# Patient Record
Sex: Male | Born: 1955
Health system: Southern US, Community
[De-identification: ages and names within clinical notes are randomized; demographics above are authoritative.]

## PROBLEM LIST (undated history)

## (undated) DIAGNOSIS — F410 Panic disorder [episodic paroxysmal anxiety] without agoraphobia: Secondary | ICD-10-CM

## (undated) DIAGNOSIS — F32A Depression, unspecified: Secondary | ICD-10-CM

## (undated) DIAGNOSIS — I10 Essential (primary) hypertension: Secondary | ICD-10-CM

## (undated) DIAGNOSIS — H9190 Unspecified hearing loss, unspecified ear: Secondary | ICD-10-CM

## (undated) DIAGNOSIS — I739 Peripheral vascular disease, unspecified: Secondary | ICD-10-CM

## (undated) DIAGNOSIS — R160 Hepatomegaly, not elsewhere classified: Secondary | ICD-10-CM

## (undated) DIAGNOSIS — F329 Major depressive disorder, single episode, unspecified: Secondary | ICD-10-CM

## (undated) DIAGNOSIS — R935 Abnormal findings on diagnostic imaging of other abdominal regions, including retroperitoneum: Secondary | ICD-10-CM

## (undated) DIAGNOSIS — B1920 Unspecified viral hepatitis C without hepatic coma: Secondary | ICD-10-CM

## (undated) DIAGNOSIS — F419 Anxiety disorder, unspecified: Secondary | ICD-10-CM

## (undated) DIAGNOSIS — C220 Liver cell carcinoma: Secondary | ICD-10-CM

## (undated) DIAGNOSIS — K7030 Alcoholic cirrhosis of liver without ascites: Secondary | ICD-10-CM

## (undated) HISTORY — DX: Essential (primary) hypertension: I10

## (undated) HISTORY — PX: COSMETIC SURGERY: SHX468

## (undated) HISTORY — DX: Peripheral vascular disease, unspecified: I73.9

## (undated) HISTORY — DX: Unspecified hearing loss, unspecified ear: H91.90

## (undated) HISTORY — DX: Unspecified viral hepatitis C without hepatic coma: B19.20

## (undated) HISTORY — PX: AORTOGRAM: SHX6300

## (undated) NOTE — *Deleted (*Deleted)
[facility]   Telephone:([phone] Fax:(336) [phone]   Clinic Follow up Note   Patient Care Team: Kallie Locks, FNP as PCP - General (Family Medicine) Malachy Mood, MD as Consulting Physician (Hematology) Malachy Moan, MD as Consulting Physician (Interventional Radiology) Malachy Mood, MD as Consulting Physician (Hematology)  Date of Service:  09/08/2020  CHIEF COMPLAINT: f/u of recurrent HCC  SUMMARY OF ONCOLOGIC HISTORY: Oncology History Overview Note  Cancer Staging No matching staging information was found for the patient.    HCC (hepatocellular carcinoma) (HCC)  04/25/2014 Imaging   MRI Abdomen 04/25/14 - DIAGNOSED IMPRESSION: 3.2 cm enhancing lesion in the posterior segment right hepatic lobe, increased, highly suspicious for hepatocellular carcinoma.   Cirrhosis with splenomegaly and small volume abdominal ascites. Portal vein is patent.   Cholelithiasis with suspected secondary gallbladder wall thickening/edema. If there is clinical concern for cholecystitis, consider hepatobiliary nuclear medicine scan.   06/09/2014 Initial Diagnosis   HCC (hepatocellular carcinoma) (HCC)   06/09/2014 Procedure   He was treated by combination drug-eluting bead transarterial chemoembolization followed by microwave thermal ablation on 6/25 and 06/10/2014. Treated by Dr Ruthe Mannan    07/11/2014 Imaging   MRI abdomen 07/11/14  IMPRESSION: 1. Status post normal ablation of right hepatic lobe a hepatoma. There is no evidence for residual/recurrent enhancing tissue within the ablation site. 2. Cirrhosis and stigmata of portal venous hypertension.     Imaging   Mostly Stable MRIs from 10/03/14 to 02/09/19   02/09/2019 Imaging   MRI Abdomen  IMPRESSION: 1. LI-RADS category LR-3 lesion in segment 7 of the liver and separate LI-RADS category LR-3 lesion in segment 6 of the liver, both essentially stable in size from 08/11/2018. Surveillance imaging is  suggested. 2. Hepatic cirrhosis with splenorenal shunting indicating portal venous hypertension. 3. No recurrence along the ablation site itself. 4. Multiple gallstones. 5.  Aortic Atherosclerosis (ICD10-I70.0). 6. Splenomegaly.   02/01/2020 Imaging   MRI abdomen 02/01/20   IMPRESSION: 1. Recurrent multifocal hepatocellular carcinoma with significant growth of segment 7 right liver dome tumor, new tumor at the previous ablation defect in segment 7, enlarging segment 6 tumor and new segment 2 tumor. 2. Cirrhosis. Mild-to-moderate splenomegaly. Small paraumbilical and perisplenic varices. No ascites. 3. Cholelithiasis.  No biliary ductal dilatation. 4.  Aortic Atherosclerosis (ICD10-I70.0).   03/21/2020 Procedure   He was treated with Y90 Radio embolizatino on 03/21/20 and 04/18/20      CURRENT THERAPY:  ***  INTERVAL HISTORY: *** Jeffrey Snow is here for a follow up. He presents to the clinic alone.    REVIEW OF SYSTEMS:  *** Constitutional: Denies fevers, chills or abnormal weight loss Eyes: Denies blurriness of vision Ears, nose, mouth, throat, and face: Denies mucositis or sore throat Respiratory: Denies cough, dyspnea or wheezes Cardiovascular: Denies palpitation, chest discomfort or lower extremity swelling Gastrointestinal:  Denies nausea, heartburn or change in bowel habits Skin: Denies abnormal skin rashes Lymphatics: Denies new lymphadenopathy or easy bruising Neurological:Denies numbness, tingling or new weaknesses Behavioral/Psych: Mood is stable, no new changes  All other systems were reviewed with the patient and are negative.  MEDICAL HISTORY:  Past Medical History:  Diagnosis Date  . Abnormal MRI of abdomen, liver  04/21/2014  . Anxiety   . Cirrhosis, alcoholic (HCC) 04/21/2014  . Claudication of left lower extremity (HCC) m-1  . Depression   . HCC (hepatocellular carcinoma) (HCC)   . Hepatitis C   . Hypertension   . Liver mass   . Panic  attack      SURGICAL HISTORY: Past Surgical History:  Procedure Laterality Date  . ABDOMINAL AORTAGRAM N/A 12/27/2013   Procedure: ABDOMINAL Ronny Flurry;  Surgeon: Fransisco Hertz, MD;  Location: [facility] CATH LAB;  Service: Cardiovascular;  Laterality: N/A;  . AORTOGRAM    . COSMETIC SURGERY     facial reconstructive surgery  . ESOPHAGOGASTRODUODENOSCOPY N/A 12/17/2014   Procedure: ESOPHAGOGASTRODUODENOSCOPY (EGD);  Surgeon: Barrie Folk, MD;  Location: [facility];  Service: Endoscopy;  Laterality: N/A;  . ESOPHAGOGASTRODUODENOSCOPY N/A 09/05/2015   Procedure: ESOPHAGOGASTRODUODENOSCOPY (EGD);  Surgeon: Graylin Shiver, MD;  Location: [facility];  Service: Endoscopy;  Laterality: N/A;  . IR ANGIOGRAM EXTREMITY RIGHT  03/21/2020  . IR ANGIOGRAM SELECTIVE EACH ADDITIONAL VESSEL  03/10/2020  . IR ANGIOGRAM SELECTIVE EACH ADDITIONAL VESSEL  03/10/2020  . IR ANGIOGRAM SELECTIVE EACH ADDITIONAL VESSEL  03/10/2020  . IR ANGIOGRAM SELECTIVE EACH ADDITIONAL VESSEL  03/10/2020  . IR ANGIOGRAM SELECTIVE EACH ADDITIONAL VESSEL  03/10/2020  . IR ANGIOGRAM SELECTIVE EACH ADDITIONAL VESSEL  03/21/2020  . IR ANGIOGRAM SELECTIVE EACH ADDITIONAL VESSEL  03/21/2020  . IR ANGIOGRAM SELECTIVE EACH ADDITIONAL VESSEL  03/21/2020  . IR ANGIOGRAM SELECTIVE EACH ADDITIONAL VESSEL  03/21/2020  . IR ANGIOGRAM SELECTIVE EACH ADDITIONAL VESSEL  03/21/2020  . IR ANGIOGRAM SELECTIVE EACH ADDITIONAL VESSEL  04/18/2020  . IR ANGIOGRAM SELECTIVE EACH ADDITIONAL VESSEL  04/18/2020  . IR ANGIOGRAM VISCERAL SELECTIVE  03/10/2020  . IR ANGIOGRAM VISCERAL SELECTIVE  03/21/2020  . IR ANGIOGRAM VISCERAL SELECTIVE  04/18/2020  . IR EMBO ARTERIAL NOT HEMORR HEMANG INC GUIDE ROADMAPPING  03/10/2020  . IR EMBO TUMOR ORGAN ISCHEMIA INFARCT INC GUIDE ROADMAPPING  03/21/2020  . IR EMBO TUMOR ORGAN ISCHEMIA INFARCT INC GUIDE ROADMAPPING  04/18/2020  . IR GENERIC HISTORICAL  01/19/2015   IR RADIOLOGIST EVAL & MGMT 01/19/2015 Malachy Moan, MD [facility] INTERV RAD  . IR GENERIC HISTORICAL   07/23/2016   IR RADIOLOGIST EVAL & MGMT 07/23/2016 Malachy Moan, MD [facility] INTERV RAD  . IR GENERIC HISTORICAL  05/30/2015   IR RADIOLOGIST EVAL & MGMT 05/30/2015 [facility] INTERV RAD  . IR GENERIC HISTORICAL  10/04/2014   IR RADIOLOGIST EVAL & MGMT 10/04/2014 Malachy Moan, MD [facility] INTERV RAD  . IR GENERIC HISTORICAL  02/11/2017   IR RADIOLOGIST EVAL & MGMT 02/11/2017 Malachy Moan, MD [facility] INTERV RAD  . IR RADIOLOGIST EVAL & MGMT  08/13/2017  . IR RADIOLOGIST EVAL & MGMT  08/11/2018  . IR RADIOLOGIST EVAL & MGMT  02/09/2019  . IR RADIOLOGIST EVAL & MGMT  02/10/2020  . IR RADIOLOGIST EVAL & MGMT  05/11/2020  . IR RADIOLOGIST EVAL & MGMT  08/09/2020  . IR US GUIDE VASC ACCESS LEFT  03/10/2020  . IR US GUIDE VASC ACCESS LEFT  03/21/2020  . IR US GUIDE VASC ACCESS LEFT  04/18/2020  . IR US GUIDE VASC ACCESS RIGHT  03/21/2020  . LOWER EXTREMITY ANGIOGRAM Bilateral 12/27/2013   Procedure: LOWER EXTREMITY ANGIOGRAM;  Surgeon: Fransisco Hertz, MD;  Location: [facility] CATH LAB;  Service: Cardiovascular;  Laterality: Bilateral;    I have reviewed the social history and family history with the patient and they are unchanged from previous note.  ALLERGIES:  is allergic to folic acid and oxycodone.  MEDICATIONS:  Current Outpatient Medications  Medication Sig Dispense Refill  . albuterol (VENTOLIN HFA) 108 (90 Base) MCG/ACT inhaler Inhale 2 puffs into the lungs every 6 (six) hours as needed for wheezing or shortness of breath.  18 g 11  . ALPRAZolam (XANAX) 0.5 MG tablet Take 0.5 mg by mouth 2 (two) times daily.     . bisacodyl (DULCOLAX) 5 MG EC tablet Take 5 mg by mouth daily as needed for mild constipation or moderate constipation. Reported on 04/03/2016    . furosemide (LASIX) 20 MG tablet TAKE 1 TABLET BY MOUTH EVERY MORNING 90 tablet 1  . furosemide (LASIX) 20 MG tablet TAKE 1 TABLET(20 MG) BY MOUTH DAILY 90 tablet 0  . gabapentin (NEURONTIN) 300 MG capsule Take 1 capsule (300 mg total) by mouth 3 (three)  times daily. 90 capsule 6  . hydrOXYzine (ATARAX/VISTARIL) 10 MG tablet Take 1 tablet (10 mg total) by mouth 3 (three) times daily as needed. 90 tablet 6  . pantoprazole (PROTONIX) 40 MG tablet TAKE 1 TABLET(40 MG) BY MOUTH TWICE DAILY BEFORE A MEAL 180 tablet 1  . spironolactone (ALDACTONE) 25 MG tablet TAKE 3 TABLETS(75 MG) BY MOUTH DAILY 90 tablet 6  . vitamin C (ASCORBIC ACID) 250 MG tablet Take 500 mg by mouth daily.     No current facility-administered medications for this visit.    PHYSICAL EXAMINATION: ECOG PERFORMANCE STATUS: {CHL ONC ECOG PS:[phone]}  There were no vitals filed for this visit. There were no vitals filed for this visit. *** GENERAL:alert, no distress and comfortable SKIN: skin color, texture, turgor are normal, no rashes or significant lesions EYES: normal, Conjunctiva are pink and non-injected, sclera clear {OROPHARYNX:no exudate, no erythema and lips, buccal mucosa, and tongue normal}  NECK: supple, thyroid normal size, non-tender, without nodularity LYMPH:  no palpable lymphadenopathy in the cervical, axillary {or inguinal} LUNGS: clear to auscultation and percussion with normal breathing effort HEART: regular rate & rhythm and no murmurs and no lower extremity edema ABDOMEN:abdomen soft, non-tender and normal bowel sounds Musculoskeletal:no cyanosis of digits and no clubbing  NEURO: alert & oriented x 3 with fluent speech, no focal motor/sensory deficits  LABORATORY DATA:  I have reviewed the data as listed CBC Latest Ref Rng & Units 08/03/2020 05/01/2020 04/18/2020  WBC 3.8 - 10.8 Thousand/uL 3.3(L) 5.2 4.6  Hemoglobin 13.2 - 17.1 g/dL 45.4 09.8 11.9  Hematocrit 38 - 50 % 43.6 45.3 47.8  Platelets 140 - 400 Thousand/uL 40(L) 45(L) 61(L)     CMP Latest Ref Rng & Units 08/03/2020 05/01/2020 04/18/2020  Glucose 65 - 139 mg/dL 95 88 147(W)  BUN 7 - 25 mg/dL 7 8 15   Creatinine 0.70 - 1.25 mg/dL 2.95 6.21 3.08  Sodium 135 - 146 mmol/L 138 139 138   Potassium 3.5 - 5.3 mmol/L 3.1(L) 3.8 4.3  Chloride 98 - 110 mmol/L 104 105 107  CO2 20 - 32 mmol/L 27 25 23   Calcium 8.6 - 10.3 mg/dL 6.5(H) 8.9 8.9  Total Protein 6.1 - 8.1 g/dL 8.4(O) 6.0(L) 7.1  Total Bilirubin 0.2 - 1.2 mg/dL 2.5(H) 1.2 1.1  Alkaline Phos 38 - 126 U/L - - 132(H)  AST 10 - 35 U/L 22 35 33  ALT 9 - 46 U/L 16 38 30      RADIOGRAPHIC STUDIES: I have personally reviewed the radiological images as listed and agreed with the findings in the report. No results found.   ASSESSMENT & PLAN:  Jeffrey Snow is a 72 y.o. male with    1. Recurrent multifocal hepatocellular carcinoma   -He was initially diagnosed by MRI and elevated tumor marker on 04/25/14. He was treated with microwave ablation in 05/2014.  He has longstanding history of  alcohol and hepatitis C related liver cirrhosis. -He had been seen by Dr Ruthe Mannan with MRIs since then. He was found to have multifocal cancer recurrence in liver on 02/01/20 MRI with elevated tumor marker again.  -Given his multifocal cancer in liver he is not eligible for liver surgery or transplant. His cancer is no longer curable but still treatable to control his disease and prolong his life. I tried to discuss this with him today.  -He was treated with Y90 on 03/21/20 and again on 04/18/20. He is recovering well. -I discussed Y90 did not cure him and his cancer will eventually start growing. When he does progress systemic treatment is recommend to control his disease.  I discussed the systemic treatment options, including oral TKI's, intravenous immunotherapy, with or without bevacizumab. -Patient seemed to have very low healthy literacy, and does not understand why he needs to see me.  Despite I repeatedly explained his diagnosis, prognosis, and treatment options, he did not seem to understand well why he may need systemic therapy, and he was upset that he has losing the eligibility for liver transplant. -Labs from last week reviewed, CBC  and CMP WNL except plt 61K, BG 104, Alk Phos 132. Physical exam unremarkable today.  -I recommend CT Chest to complete staging of current liver cancer.  Patient declined. -F/u 4 to 5 months with lab. I will contact Dr. Ruthe Mannan on when he plans to f/u with him for repeat MRI liver s/p treatment.    2. Liver Cirrhosis, Hep C (treated) -He notes he quit drinking alcohol 10-15 years ago after drinking 8-10 beers a day. He also quit smoking at the same time after smoking for 35 years. He notes he was negative for prior lung cancer screening.  -He was diagnosed with liver cirrhosis by Dr Mayford Knife about 10 years ago and he was referred to Dr. Ruthe Mannan who has been monitored by him since.  -His 2016 upper Endoscopies by Dr Evette Cristal and Dr Madilyn Fireman were negative for Esophageal Varices.  -He was treated with Harvoni for his Hep C by Dr Madilyn Fireman. He was diagnosed after his Cirrhosis.  -He was going to be seen by Annamarie Major for liver transplant, but not found to be eligible per Dr Ruthe Mannan.  -He is on Lasix to keep fluid off his liver. He has been on long term managed by Dr Bradly Chris, per pt.     3. Anxiety/Depression  -On Xanax   4. Chronic right foot neuropathy  -He is being seen by pain specialist at [facility] -He has been on Oxycodone 1-2 tab every few days as needed for this. He is also on 100mg  Gabapentin TID which he does not feel helps him enough.    5. Social Support  -He is single with 3 daughters who live out of town.  -He notes he is able to take care of himself. He does have transportation assistance.     PLAN: -pt declined CT chest  -F/u in 4-5 months for f/u, if he will see Dr. Ruthe Mannan in 3 months with liver MRI     No problem-specific Assessment & Plan notes found for this encounter.   No orders of the defined types were placed in this encounter.  All questions were answered. The patient knows to call the clinic with any problems, questions or concerns. No  barriers to learning was detected. The total time spent in the appointment was {CHL ONC TIME VISIT - [id number]}.     Delphina Cahill 09/08/2020  Oneal Deputy, am acting as scribe for Truitt Merle, MD.   {Add scribe attestation statement}

---

## 1998-12-03 ENCOUNTER — Emergency Department (HOSPITAL_COMMUNITY): Admission: EM | Admit: 1998-12-03 | Discharge: 1998-12-03 | Payer: Self-pay

## 1999-03-09 ENCOUNTER — Encounter: Payer: Self-pay | Admitting: Emergency Medicine

## 1999-03-09 ENCOUNTER — Emergency Department (HOSPITAL_COMMUNITY): Admission: EM | Admit: 1999-03-09 | Discharge: 1999-03-09 | Payer: Self-pay | Admitting: Emergency Medicine

## 2000-01-10 ENCOUNTER — Emergency Department (HOSPITAL_COMMUNITY): Admission: EM | Admit: 2000-01-10 | Discharge: 2000-01-10 | Payer: Self-pay | Admitting: *Deleted

## 2000-01-13 ENCOUNTER — Emergency Department (HOSPITAL_COMMUNITY): Admission: EM | Admit: 2000-01-13 | Discharge: 2000-01-13 | Payer: Self-pay | Admitting: Emergency Medicine

## 2000-01-18 ENCOUNTER — Emergency Department (HOSPITAL_COMMUNITY): Admission: EM | Admit: 2000-01-18 | Discharge: 2000-01-18 | Payer: Self-pay | Admitting: Emergency Medicine

## 2001-04-07 ENCOUNTER — Emergency Department (HOSPITAL_COMMUNITY): Admission: EM | Admit: 2001-04-07 | Discharge: 2001-04-07 | Payer: Self-pay | Admitting: Emergency Medicine

## 2001-05-24 ENCOUNTER — Emergency Department (HOSPITAL_COMMUNITY): Admission: EM | Admit: 2001-05-24 | Discharge: 2001-05-24 | Payer: Self-pay | Admitting: Emergency Medicine

## 2001-07-01 ENCOUNTER — Encounter: Payer: Self-pay | Admitting: Emergency Medicine

## 2001-07-01 ENCOUNTER — Emergency Department (HOSPITAL_COMMUNITY): Admission: EM | Admit: 2001-07-01 | Discharge: 2001-07-01 | Payer: Self-pay | Admitting: Emergency Medicine

## 2002-07-17 ENCOUNTER — Emergency Department (HOSPITAL_COMMUNITY): Admission: EM | Admit: 2002-07-17 | Discharge: 2002-07-17 | Payer: Self-pay | Admitting: Emergency Medicine

## 2004-02-03 ENCOUNTER — Emergency Department (HOSPITAL_COMMUNITY): Admission: EM | Admit: 2004-02-03 | Discharge: 2004-02-03 | Payer: Self-pay | Admitting: Emergency Medicine

## 2005-08-06 ENCOUNTER — Emergency Department (HOSPITAL_COMMUNITY): Admission: EM | Admit: 2005-08-06 | Discharge: 2005-08-06 | Payer: Self-pay | Admitting: Emergency Medicine

## 2009-02-20 ENCOUNTER — Emergency Department (HOSPITAL_COMMUNITY): Admission: EM | Admit: 2009-02-20 | Discharge: 2009-02-20 | Payer: Self-pay | Admitting: Emergency Medicine

## 2009-11-02 ENCOUNTER — Encounter: Admission: RE | Admit: 2009-11-02 | Discharge: 2009-11-02 | Payer: Self-pay | Admitting: Specialist

## 2011-10-24 ENCOUNTER — Emergency Department (HOSPITAL_COMMUNITY)
Admission: EM | Admit: 2011-10-24 | Discharge: 2011-10-24 | Disposition: A | Discharge status: Home / Self Care | Payer: Medicaid Other | Attending: Emergency Medicine | Admitting: Emergency Medicine

## 2011-10-24 ENCOUNTER — Encounter: Payer: Self-pay | Admitting: *Deleted

## 2011-10-24 DIAGNOSIS — R21 Rash and other nonspecific skin eruption: Secondary | ICD-10-CM | POA: Insufficient documentation

## 2011-10-24 DIAGNOSIS — L089 Local infection of the skin and subcutaneous tissue, unspecified: Secondary | ICD-10-CM | POA: Insufficient documentation

## 2011-10-24 DIAGNOSIS — B86 Scabies: Secondary | ICD-10-CM | POA: Insufficient documentation

## 2011-10-24 DIAGNOSIS — F17200 Nicotine dependence, unspecified, uncomplicated: Secondary | ICD-10-CM | POA: Insufficient documentation

## 2011-10-24 HISTORY — DX: Major depressive disorder, single episode, unspecified: F32.9

## 2011-10-24 HISTORY — DX: Panic disorder (episodic paroxysmal anxiety): F41.0

## 2011-10-24 HISTORY — DX: Anxiety disorder, unspecified: F41.9

## 2011-10-24 HISTORY — DX: Depression, unspecified: F32.A

## 2011-10-24 MED ORDER — PERMETHRIN 5 % EX CREA: TOPICAL_CREAM | CUTANEOUS | Status: AC

## 2011-10-24 MED ORDER — PREDNISONE 10 MG PO TABS: 20.0000 mg | ORAL_TABLET | Freq: Every day | ORAL | Status: AC

## 2011-10-24 MED ORDER — DOXYCYCLINE HYCLATE 100 MG PO CAPS: 100.0000 mg | ORAL_CAPSULE | Freq: Two times a day (BID) | ORAL | Status: AC

## 2011-10-24 NOTE — ED Provider Notes (Signed)
History     CSN: [id number] Arrival date & time: 10/24/2011 11:35 AM   First MD Initiated Contact with Patient 10/24/11 1225      Chief Complaint  Patient presents with  . Pain    (Consider location/radiation/quality/duration/timing/severity/associated sxs/prior treatment) Patient is a 55 y.o. male presenting with rash.  Rash  This is a chronic problem. The current episode started more than 1 week ago. The problem has been gradually worsening. The problem is associated with an unknown factor. There has been no fever. The rash is present on the trunk, left lower leg, left arm, right lower leg and right arm. The pain is at a severity of 4/10. The pain is mild. Associated symptoms include itching, pain and weeping. He has tried anti-itch cream for the symptoms. The treatment provided no relief.   Reports rash to upper and lower extremities x 1 month.  Itching to the rash.  Multiple ? Infected ulsers with some pain 4/10.  Denies fever.  States that he just moved and is sleeping on a used mattress.  The rash looks like areas that have been itched and picked at until it drains and becomes infected.  The hair on the patients legs seems to have eggs attached to the hairs.  Denies others in the household with the same. Past Medical History  Diagnosis Date  . Depression   . Anxiety   . Panic attack     Past Surgical History  Procedure Date  . Cosmetic surgery     facial reconstructive surgery    History reviewed. No pertinent family history.  History  Substance Use Topics  . Smoking status: Current Everyday Smoker -- 0.5 packs/day  . Smokeless tobacco: Not on file  . Alcohol Use: 3.6 oz/week    6 Cans of beer per week      Review of Systems  Skin: Positive for itching and rash.  All other systems reviewed and are negative.    Allergies  Review of patient's allergies indicates no known allergies.  Home Medications   Current Outpatient Rx  Name Route Sig Dispense Refill    . ALPRAZOLAM 0.25 MG PO TABS Oral Take 0.25 mg by mouth 3 (three) times daily.      Marland Kitchen DIPHENHYDRAMINE HCL 25 MG PO CAPS Oral Take 25 mg by mouth every 4 (four) hours as needed. For rash     . FLUOXETINE HCL 10 MG PO CAPS Oral Take 10 mg by mouth daily.        BP 131/74  Pulse 71  Temp(Src) 97.8 F (36.6 C) (Oral)  Resp 18  Wt 150 lb (68.04 kg)  SpO2 100%  Physical Exam  Abdominal: Soft.  Skin: Skin is warm and dry. Rash noted.    ED Course  Procedures (including critical care time)  Labs Reviewed - No data to display No results found.   No diagnosis found.    MDM           Jethro Bastos, NP 10/24/11 1555

## 2011-10-24 NOTE — ED Notes (Signed)
Pt states "These places have been here for about a month, been using hydrocortisone cream, they itch"; pt present with multiple healing lesions to arms & left leg

## 2011-10-25 NOTE — ED Provider Notes (Signed)
Medical screening examination/treatment/procedure(s) were performed by non-physician practitioner and as supervising physician I was immediately available for consultation/collaboration.  Geoffery Lyons, MD 10/25/11 1537

## 2012-10-08 ENCOUNTER — Telehealth: Payer: Self-pay | Admitting: Internal Medicine

## 2012-10-08 NOTE — Telephone Encounter (Signed)
S/W PT IN REF TO NP APPT. ON 10/12/12@1 :30 REFERRING DR. Lerry Liner DX-LOW PLATELETS MAILED NP PACKET

## 2012-10-08 NOTE — Telephone Encounter (Signed)
C/D 10/08/12 for appt 10/12/12

## 2012-10-12 ENCOUNTER — Encounter: Payer: Self-pay | Admitting: Internal Medicine

## 2012-10-12 ENCOUNTER — Ambulatory Visit (HOSPITAL_BASED_OUTPATIENT_CLINIC_OR_DEPARTMENT_OTHER): Payer: Medicaid Other | Admitting: Internal Medicine

## 2012-10-12 ENCOUNTER — Other Ambulatory Visit (HOSPITAL_BASED_OUTPATIENT_CLINIC_OR_DEPARTMENT_OTHER): Payer: Medicaid Other

## 2012-10-12 ENCOUNTER — Telehealth: Payer: Self-pay | Admitting: Internal Medicine

## 2012-10-12 ENCOUNTER — Other Ambulatory Visit: Payer: Self-pay | Admitting: Medical Oncology

## 2012-10-12 ENCOUNTER — Ambulatory Visit: Payer: Medicaid Other

## 2012-10-12 VITALS — BP 163/79 | HR 52 | Temp 96.9°F | Resp 20 | Ht 68.0 in | Wt 155.0 lb

## 2012-10-12 DIAGNOSIS — F1010 Alcohol abuse, uncomplicated: Secondary | ICD-10-CM

## 2012-10-12 DIAGNOSIS — F17200 Nicotine dependence, unspecified, uncomplicated: Secondary | ICD-10-CM

## 2012-10-12 DIAGNOSIS — D696 Thrombocytopenia, unspecified: Secondary | ICD-10-CM

## 2012-10-12 LAB — COMPREHENSIVE METABOLIC PANEL (CC13)
ALT: 151 U/L — ABNORMAL HIGH (ref 0–55)
AST: 111 U/L — ABNORMAL HIGH (ref 5–34)
Albumin: 3.8 g/dL (ref 3.5–5.0)
Alkaline Phosphatase: 88 U/L (ref 40–150)
BUN: 7 mg/dL (ref 7.0–26.0)
CO2: 22 mEq/L (ref 22–29)
Calcium: 9.3 mg/dL (ref 8.4–10.4)
Chloride: 103 mEq/L (ref 98–107)
Creatinine: 0.8 mg/dL (ref 0.7–1.3)
Glucose: 103 mg/dl — ABNORMAL HIGH (ref 70–99)
Potassium: 4.3 mEq/L (ref 3.5–5.1)
Sodium: 134 mEq/L — ABNORMAL LOW (ref 136–145)
Total Bilirubin: 1.08 mg/dL (ref 0.20–1.20)
Total Protein: 6.5 g/dL (ref 6.4–8.3)

## 2012-10-12 LAB — CBC WITH DIFFERENTIAL/PLATELET
BASO%: 0.2 % (ref 0.0–2.0)
Basophils Absolute: 0 10*3/uL (ref 0.0–0.1)
EOS%: 0.7 % (ref 0.0–7.0)
Eosinophils Absolute: 0 10*3/uL (ref 0.0–0.5)
HCT: 42.7 % (ref 38.4–49.9)
HGB: 14.6 g/dL (ref 13.0–17.1)
LYMPH%: 29.4 % (ref 14.0–49.0)
MCH: 31.1 pg (ref 27.2–33.4)
MCHC: 34.2 g/dL (ref 32.0–36.0)
MCV: 90.9 fL (ref 79.3–98.0)
MONO#: 0.5 10*3/uL (ref 0.1–0.9)
MONO%: 13.1 % (ref 0.0–14.0)
NEUT#: 2.3 10*3/uL (ref 1.5–6.5)
NEUT%: 56.6 % (ref 39.0–75.0)
Platelets: 115 10*3/uL — ABNORMAL LOW (ref 140–400)
RBC: 4.7 10*6/uL (ref 4.20–5.82)
RDW: 13.7 % (ref 11.0–14.6)
WBC: 4.1 10*3/uL (ref 4.0–10.3)
lymph#: 1.2 10*3/uL (ref 0.9–3.3)
nRBC: 0 % (ref 0–0)

## 2012-10-12 LAB — LACTATE DEHYDROGENASE (CC13): LDH: 158 U/L (ref 125–220)

## 2012-10-12 NOTE — Patient Instructions (Signed)
You still have low platelets count but much improved compared to the previous lab work at Dr. Mayford Knife office. Will continue with observation for now. Followup in 3 months

## 2012-10-12 NOTE — Progress Notes (Signed)
Checked in new patient. No financial issues. °

## 2012-10-12 NOTE — Telephone Encounter (Signed)
appts made and printed for pt aom °

## 2012-10-12 NOTE — Progress Notes (Signed)
[facility] Telephone:(336) [phone]   Fax:(336) [phone]  CONSULT NOTE  REASON FOR CONSULTATION:  56 years old white male with thrombocytopenia.  HPI Jeffrey Snow is a 56 y.o. male was past medical history significant for hypertension, depression and anxiety as well as long history of alcohol and tobacco abuse. The patient was seen recently by his primary care physician Dr. Mayford Knife and on repeat blood work on 10/02/2012, he was found to have low platelets count of 38,000. He for the elevated liver enzymes. For the elevated liver enzyme, for the elevated liver enzymes regarding his liver enzymes was also found to have elevated liver enzymes including AST of 321 and ALT of 243. He had a similar episode of low platelets 2 years ago before dental extraction and the patient was taken daily aspirin and BC. He does continue that for a week and his platelets count recovered before his dental extraction. He was referred to me today for evaluation and recommendation regarding his thrombocytopenia. The patient continues to take baby aspirin on daily basis. The patient is feeling fine today with no specific complaints. He denied having any bleeding issues, bruises or ecchymosis. He specifically denied having any epistaxis, gum bleeds or rectal bleeding.  He has no significant weight loss or night sweats. He has no chest pain, shortness breath, cough or hemoptysis. His brother also has thrombocytopenia and hepatitis C. The patient is single and has 2 daughters. 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 He has a history of smoking one pack per day for the last 60 years and unfortunately he continues to smoke and I strongly encouraged him to quit smoking. He also has a history of alcohol abuse and still drinks around 4 beers every day. No history of drug abuse. @SFHPI @  Past Medical History  Diagnosis Date  . Depression   . Anxiety   . Panic attack     Past  Surgical History  Procedure Date  . Cosmetic surgery     facial reconstructive surgery    No family history on file.  Social History History  Substance Use Topics  . Smoking status: Current Every Day Smoker -- 0.5 packs/day  . Smokeless tobacco: Not on file  . Alcohol Use: 3.6 oz/week    6 Cans of beer per week    No Known Allergies  Current Outpatient Prescriptions  Medication Sig Dispense Refill  . ALPRAZolam (XANAX) 0.25 MG tablet Take 0.25 mg by mouth 3 (three) times daily.        Marland Kitchen FLUoxetine (PROZAC) 10 MG capsule Take 10 mg by mouth daily.        Marland Kitchen zolpidem (AMBIEN) 10 MG tablet Take 10 mg by mouth at bedtime as needed.      . diphenhydrAMINE (BENADRYL) 25 mg capsule Take 25 mg by mouth every 4 (four) hours as needed. For rash         Review of Systems  A comprehensive review of systems was negative.  Physical Exam  AVW:[id number], healthy, no distress, well nourished and well developed SKIN: skin color, texture, turgor are normal HEAD: Normocephalic EYES: normal EARS: External ears normal OROPHARYNX:no exudate and no erythema  NECK: supple, no adenopathy LYMPH:  no palpable lymphadenopathy, no hepatosplenomegaly LUNGS: clear to auscultation  HEART: regular rate & rhythm, no murmurs and no gallops ABDOMEN:abdomen soft, non-tender, normal bowel sounds and no masses or organomegaly BACK: Back symmetric, no curvature. EXTREMITIES:no joint deformities, effusion, or inflammation, no  edema, no skin discoloration  NEURO: alert & oriented x 3 with fluent speech, no focal motor/sensory deficits  PERFORMANCE STATUS: ECOG 1  LABORATORY DATA: Lab Results  Component Value Date   WBC 4.1 10/12/2012   HGB 14.6 10/12/2012   HCT 42.7 10/12/2012   MCV 90.9 10/12/2012   PLT 115* 10/12/2012      Chemistry      Component Value Date/Time   NA 134* 10/12/2012 1331   K 4.3 10/12/2012 1331   CL 103 10/12/2012 1331   CO2 22 10/12/2012 1331   BUN 7.0 10/12/2012 1331    CREATININE 0.8 10/12/2012 1331      Component Value Date/Time   CALCIUM 9.3 10/12/2012 1331   ALKPHOS 88 10/12/2012 1331   AST 111* 10/12/2012 1331   ALT 151* 10/12/2012 1331   BILITOT 1.08 10/12/2012 1331       RADIOGRAPHIC STUDIES: No results found.  ASSESSMENT:  This is a very pleasant 56 years old white male with history of thrombocytopenia most likely secondary to alcohol abuse plus/minus mild ITP. His platelets count significantly improved today, up to 115,000.  PLAN: I have a lengthy discussion with the patient today about his condition. He had elevated liver enzymes on the previous bloodwork which is again consistent with his history of alcohol abuse. I strongly encouraged him to quit alcohol drinking. I also advised the patient not to take any over-the-counter medication and to stop taking aspirin on daily basis. I recommend for him continuous observation for now.  I would see him back for followup visit in 3 months with repeat CBC and LDH. For the elevated liver enzymes, this currently evaluated by his PCP, Dr. Mayford Knife.  The patient was advised to call me immediately if he has any concerning symptoms in the interval.  All questions were answered. The patient knows to call the clinic with any problems, questions or concerns. We can certainly see the patient much sooner if necessary.  Thank you so much for allowing me to participate in the care of Jeffrey Snow. I will continue to follow up the patient with you and assist in his care.  I spent 30 minutes counseling the patient face to face. The total time spent in the appointment was 55 minutes.  Genie Wenke K. 10/12/2012, 3:12 PM

## 2013-01-04 ENCOUNTER — Other Ambulatory Visit: Payer: Self-pay | Admitting: Internal Medicine

## 2013-01-04 DIAGNOSIS — R748 Abnormal levels of other serum enzymes: Secondary | ICD-10-CM

## 2013-01-06 ENCOUNTER — Other Ambulatory Visit: Payer: Self-pay | Admitting: Gastroenterology

## 2013-01-06 ENCOUNTER — Ambulatory Visit
Admission: RE | Admit: 2013-01-06 | Discharge: 2013-01-06 | Disposition: A | Discharge status: Home / Self Care | Payer: Medicaid Other | Source: Ambulatory Visit | Attending: Internal Medicine | Admitting: Internal Medicine

## 2013-01-06 DIAGNOSIS — K7689 Other specified diseases of liver: Secondary | ICD-10-CM

## 2013-01-06 DIAGNOSIS — R748 Abnormal levels of other serum enzymes: Secondary | ICD-10-CM

## 2013-01-11 ENCOUNTER — Other Ambulatory Visit: Payer: Medicaid Other | Admitting: Lab

## 2013-01-11 ENCOUNTER — Ambulatory Visit: Payer: Medicaid Other | Admitting: Internal Medicine

## 2013-01-11 ENCOUNTER — Other Ambulatory Visit: Payer: Medicaid Other

## 2013-01-12 ENCOUNTER — Other Ambulatory Visit: Payer: Medicaid Other

## 2013-01-29 ENCOUNTER — Other Ambulatory Visit: Payer: Medicaid Other

## 2013-01-30 ENCOUNTER — Ambulatory Visit
Admission: RE | Admit: 2013-01-30 | Discharge: 2013-01-30 | Disposition: A | Discharge status: Home / Self Care | Payer: Medicaid Other | Source: Ambulatory Visit | Attending: Gastroenterology | Admitting: Gastroenterology

## 2013-01-30 DIAGNOSIS — K7689 Other specified diseases of liver: Secondary | ICD-10-CM

## 2013-01-30 MED ORDER — GADOBENATE DIMEGLUMINE 529 MG/ML IV SOLN
15.0000 mL | Freq: Once | INTRAVENOUS | Status: AC | PRN
  Administered 2013-01-30: 15 mL via INTRAVENOUS

## 2013-04-20 ENCOUNTER — Other Ambulatory Visit (HOSPITAL_COMMUNITY): Payer: Self-pay | Admitting: Gastroenterology

## 2013-04-20 DIAGNOSIS — R7402 Elevation of levels of lactic acid dehydrogenase (LDH): Secondary | ICD-10-CM

## 2013-04-20 DIAGNOSIS — R935 Abnormal findings on diagnostic imaging of other abdominal regions, including retroperitoneum: Secondary | ICD-10-CM

## 2013-04-20 DIAGNOSIS — K7689 Other specified diseases of liver: Secondary | ICD-10-CM

## 2013-06-04 ENCOUNTER — Telehealth: Payer: Self-pay | Admitting: *Deleted

## 2013-06-04 NOTE — Telephone Encounter (Signed)
Pt called stating that he needs a walker for his biopsy appt today.  He states that he feel and hit his head.  He is alone and no one cares about him.  His PCP is out of town so he cannot get a walker.  Informed him that he was last seen 09/2012 for thrombocytopenia and FTKA for his appt on 12/2012 because he did not want to keep the appt so we would not be providing him with a wheelchair.  Advised he go to the ED if he fell and they can possibly provide an order for equipment he may need.  He verbalized understanding.  SLJ

## 2013-06-23 ENCOUNTER — Other Ambulatory Visit: Payer: Self-pay | Admitting: *Deleted

## 2013-06-23 DIAGNOSIS — R609 Edema, unspecified: Secondary | ICD-10-CM

## 2013-06-30 ENCOUNTER — Telehealth: Payer: Self-pay | Admitting: Vascular Surgery

## 2013-06-30 NOTE — Telephone Encounter (Addendum)
Message copied by Rosalyn Charters on Wed Jun 30, 2013  1:18 PM ------      Message from: Melene Plan      Created: Wed Jun 30, 2013 12:32 PM      Regarding: RE: needs to be seen sooner      Contact: [phone]       If they are truly having problems they need to call back their referring MD and have them call us if they think it needs moving up. Legally we can NOT advice a pt that is not ours.      ----- Message -----         From: Erenest Blank, RN         Sent: 06/30/2013  11:58 AM           To: Melene Plan, RN, Marcellus Scott, #      Subject: FW: needs to be seen sooner                              Revonda Standard- this is a new pt. that I really should not triage before he is seen here.  These messages on new pts. that are requesting to get in sooner, need to go the appointment desk please.               ----- Message -----         From: Marcellus Scott         Sent: 06/30/2013  11:11 AM           To: [name], RN      Subject: needs to be seen sooner                                  Jeffrey Snow needs to be seen asap. He is having problems and cant wait til September.            Please call            Thanks      Revonda Standard             ------  eft message for patient stating that we do not have an appt. available before 08-2013 and to call his referring md if he his truely experiencing problems as per message from judy

## 2013-08-19 ENCOUNTER — Encounter: Payer: Self-pay | Admitting: Vascular Surgery

## 2013-08-20 ENCOUNTER — Ambulatory Visit (INDEPENDENT_AMBULATORY_CARE_PROVIDER_SITE_OTHER): Payer: Medicaid Other | Admitting: Vascular Surgery

## 2013-08-20 ENCOUNTER — Encounter: Payer: Medicaid Other | Admitting: Vascular Surgery

## 2013-08-20 ENCOUNTER — Encounter (INDEPENDENT_AMBULATORY_CARE_PROVIDER_SITE_OTHER): Payer: Medicaid Other | Admitting: *Deleted

## 2013-08-20 ENCOUNTER — Encounter: Payer: Self-pay | Admitting: Vascular Surgery

## 2013-08-20 VITALS — BP 113/71 | HR 71 | Ht 68.0 in | Wt 154.0 lb

## 2013-08-20 DIAGNOSIS — R609 Edema, unspecified: Secondary | ICD-10-CM

## 2013-08-20 DIAGNOSIS — I70219 Atherosclerosis of native arteries of extremities with intermittent claudication, unspecified extremity: Secondary | ICD-10-CM

## 2013-08-20 DIAGNOSIS — M79609 Pain in unspecified limb: Secondary | ICD-10-CM | POA: Insufficient documentation

## 2013-08-20 NOTE — Progress Notes (Signed)
[facility]  Referred by:  Jearld Lesch, MD [phone] [place], [place] [address]  Reason for referral: Right leg pain  History of Present Illness  Jeffrey Snow is a 57 y.o. ([birth year]) male who presents with chief complaint: R>L leg pain.  Onset of symptom occurred ~one year ago.  Pain is described as aching and cramping, R>L, severity 3-5/10, and associated with ambulation.  Patient has attempted to treat this pain with rest.  The patient has no rest pain symptoms also and no leg wounds/ulcers.  The patient has some right leg swelling and varicosities.  The patient has been attributing his pain to his varicose veins.  By report, one year ago, the patient was walking 1.5 miles without problem.  Now he can go ~100 yards and develops sx.  Pt also notes consistent numbness in right foot and intermittent burning sensation.  Atherosclerotic risk factors include: HTN, former smoker.  Past Medical History  Diagnosis Date  . Depression   . Anxiety   . Panic attack   . Hypertension   . Hepatitis C     Past Surgical History  Procedure Laterality Date  . Cosmetic surgery      facial reconstructive surgery    History   Social History  . Marital Status: Married    Spouse Name: N/A    Number of Children: N/A  . Years of Education: N/A   Occupational History  . Not on file.   Social History Main Topics  . Smoking status: Former Smoker -- 0.50 packs/day    Quit date: 08/21/2011  . Smokeless tobacco: Not on file  . Alcohol Use: 3.6 oz/week    6 Cans of beer per week  . Drug Use: No     Comment: abused cocaine in the 80's  . Sexual Activity: Not on file   Other Topics Concern  . Not on file   Social History Narrative  . No narrative on file    Family History  Problem Relation Age of Onset  . Diabetes Father   . Diabetes Brother   . Diabetes Brother     Current Outpatient Prescriptions on File Prior to Visit  Medication Sig  Dispense Refill  . ALPRAZolam (XANAX) 0.25 MG tablet Take 0.25 mg by mouth 3 (three) times daily.        Marland Kitchen FLUoxetine (PROZAC) 10 MG capsule Take 10 mg by mouth daily.        Marland Kitchen zolpidem (AMBIEN) 10 MG tablet Take 10 mg by mouth at bedtime as needed.      . diphenhydrAMINE (BENADRYL) 25 mg capsule Take 25 mg by mouth every 4 (four) hours as needed. For rash        No current facility-administered medications on file prior to visit.    No Known Allergies  REVIEW OF SYSTEMS:  (Positives checked otherwise negative)  CARDIOVASCULAR:  [ ]  chest pain, [ ]  chest pressure, [ ]  palpitations, [ ]  shortness of breath when laying flat, [ ]  shortness of breath with exertion,   [x]  pain in feet when walking, [ ]  pain in feet when laying flat, [ ]  history of blood clot in veins (DVT), [ ]  history of phlebitis, [ ]  swelling in legs, [x]  varicose veins  PULMONARY:  [ ]  productive cough, [ ]  asthma, [ ]  wheezing  NEUROLOGIC:  [ ]  weakness in arms or legs, [x]  numbness in arms or legs, [ ]  difficulty speaking or slurred speech, [ ]   temporary loss of vision in one eye, [ ]  dizziness  HEMATOLOGIC:  [ ]  bleeding problems, [ ]  problems with blood clotting too easily  MUSCULOSKEL:  [ ]  joint pain, [ ]  joint swelling  GASTROINTEST:  [ ]   Vomiting blood, [ ]   Blood in stool     GENITOURINARY:  [ ]   Burning with urination, [ ]   Blood in urine  PSYCHIATRIC:  [ ]  history of major depression  INTEGUMENTARY:  [x]  rashes, [ ]  ulcers  CONSTITUTIONAL:  [ ]  fever, [ ]  chills  For VQI Use Only  PRE-ADM LIVING: Home  AMB STATUS: Ambulatory  CAD Sx: None  PRIOR CHF: None  STRESS TEST: [x]  No, [ ]  Normal, [ ]  + ischemia, [ ]  + MI, [ ]  Both  Physical Examination Filed Vitals:   08/20/13 1135  BP: 113/71  Pulse: 71  Height: 5\' 8"  (1.727 m)  Weight: 154 lb (69.854 kg)  SpO2: 100%   Body mass index is 23.42 kg/(m^2).  General: A&O x 3, WDWN  Head: [place]/AT  Ear/Nose/Throat: Hearing grossly intact, nares  w/o erythema or drainage, oropharynx w/o Erythema/Exudate  Eyes: PERRLA, EOMI  Neck: Supple, no nuchal rigidity, no palpable LAD  Pulmonary: Sym exp, good air movt, CTAB, no rales, rhonchi, & wheezing  Cardiac: RRR, Nl S1, S2, no Murmurs, rubs or gallops  Vascular: Vessel Right Left  Radial Palpable Palpable  Brachial  Palpable Palpable  Carotid Palpable, without bruit Palpable, without bruit  Aorta Not palpable N/A  Femoral Palpable Palpable  Popliteal Not palpable Not palpable  PT Not Palpable Not Palpable  DP Not Palpable Not Palpable   Gastrointestinal: soft, NTND, -G/R, - HSM, - masses, - CVAT B  Musculoskeletal: M/S 5/5 throughout except 3/5 PF R foot, Extremities without ischemic changes   Neurologic: CN 2-12 intact , Pain and light touch intact in extremities except decreased in R foot, Motor exam as listed above  Psychiatric: Judgment intact, Mood & affect appropriatefor pt's clinical situation  Dermatologic: See M/S exam for extremity exam, no rashes otherwise noted  Lymph : No Cervical, Axillary, or Inguinal lymphadenopathy   Non-Invasive Vascular Imaging  RLE Venous Insufficiency Duplex (Date: 08/20/2013)  Not SVT or DVT, no reflux  Incidental finding of distal R SFA occlusion with monophasic pop A, PTA, and peroneal artery  Study was reviewed and finalized by myself.  Outside Studies/Documentation 1 pages of outside documents were reviewed including: outpatient clinic note.  Medical Decision Making  Jeffrey Snow is a 57 y.o. male who presents with: B intermittent claudication, Hep C and possible cirrhosis, thrombocyotpenia   I discussed with the patient the natural history of intermittent claudication: 75% of patients have stable or improved symptoms in a year an only 2% require amputation. Eventually 20% may require intervention in a year.  I discussed in depth with the patient the nature of atherosclerosis, and emphasized the importance of  maximal medical management including strict control of blood pressure, blood glucose, and lipid levels, antiplatelet agent, obtaining regular exercise, and cessation of smoking.    The patient is aware that without maximal medical management the underlying atherosclerotic disease process will progress, limiting the benefit of any interventions.  I discussed in depth with the patient a walking plan and how to execute such.  The patient is not interested in starting Pletal. The patient is currently not on a statin.  The patient has possible cirrhosis, so there may be a medical contraindication. The patient is currently not  on an anti-platelet due thrombocytopenia.  The patient will follow up in 2 weeks with the following studies BLE ABI and BLE arterial duplex.    It is not clear to me the cause of this patient's R leg neuropathy and given his multiple co-morbidities, I will need additional information before determining what is the best medical management strategy for his PAD.  I have request some additional records and labs from his PCP.  Thank you for allowing [place] to participate in this patient's care.  Leonides Sake, MD [facility] Office: [phone] Pager: [phone]  08/20/2013, 12:25 PM

## 2013-08-24 ENCOUNTER — Other Ambulatory Visit: Payer: Self-pay | Admitting: *Deleted

## 2013-08-24 DIAGNOSIS — I739 Peripheral vascular disease, unspecified: Secondary | ICD-10-CM

## 2013-09-01 ENCOUNTER — Encounter: Payer: Self-pay | Admitting: Specialist

## 2013-09-02 ENCOUNTER — Encounter: Payer: Self-pay | Admitting: Vascular Surgery

## 2013-09-03 ENCOUNTER — Encounter: Payer: Self-pay | Admitting: Vascular Surgery

## 2013-09-03 ENCOUNTER — Encounter (INDEPENDENT_AMBULATORY_CARE_PROVIDER_SITE_OTHER): Payer: Medicaid Other | Admitting: *Deleted

## 2013-09-03 ENCOUNTER — Ambulatory Visit (INDEPENDENT_AMBULATORY_CARE_PROVIDER_SITE_OTHER): Payer: Medicaid Other | Admitting: Vascular Surgery

## 2013-09-03 VITALS — BP 127/78 | HR 67 | Ht 68.0 in | Wt 153.4 lb

## 2013-09-03 DIAGNOSIS — I739 Peripheral vascular disease, unspecified: Secondary | ICD-10-CM

## 2013-09-03 DIAGNOSIS — I70219 Atherosclerosis of native arteries of extremities with intermittent claudication, unspecified extremity: Secondary | ICD-10-CM

## 2013-09-03 NOTE — Progress Notes (Signed)
[facility]  Established Intermittent Claudication  History of Present Illness  Jeffrey Snow is a 57 y.o. (09/21/1956) male who presents with chief complaint: B leg pain.  The patient's symptoms have improved.  The patient's symptoms are: consistent with intermittent claudication.  The patient's treatment regimen currently included: maximal medical management.  He has been set up with a PCP and is schedule to she her on Oct 1.  He returns today for BLE ABI.  The patient's PMH, PSH, SH, FamHx, Med, and Allergies are unchanged from 08/20/13.  On ROS today: denies rest pain or any ulcers or gangrene  Physical Examination  Filed Vitals:   09/03/13 1432  BP: 127/78  Pulse: 67  Height: 5\' 8"  (1.727 m)  Weight: 153 lb 6.4 oz (69.582 kg)  SpO2: 100%   Body mass index is 23.33 kg/(m^2).  General: A&O x 3, WDWN   Pulmonary: Sym exp, good air movt, CTAB, no rales, rhonchi, & wheezing   Cardiac: RRR, Nl S1, S2, no Murmurs, rubs or gallops   Vascular:  Vessel  Right  Left   Radial  Palpable  Palpable   Brachial  Palpable  Palpable   Carotid  Palpable, without bruit  Palpable, without bruit   Aorta  Not palpable  N/A   Femoral  Palpable  Palpable   Popliteal  Not palpable  Not palpable   PT  Not Palpable  Not Palpable   DP  Not Palpable  Not Palpable    Gastrointestinal: soft, NTND, -G/R, - HSM, - masses, - CVAT B   Musculoskeletal: M/S 5/5 throughout except 3/5 PF R foot, Extremities without ischemic changes   Neurologic: Pain and light touch intact in extremities except decreased in R foot, Motor exam as listed above   Non-Invasive Vascular Imaging ABI (Date: 09/03/2013)  R: 0.70, DP: dampened mono, PT: dampened mono, TBI: 0.40  L: 0.69, DP: dampened mono, PT: dampended mono, TBI: 0.54  Medical Decision Making  Jeffrey Snow is a 57 y.o. male who presents with:  bilateral leg intermittent claudication without evidence of critical limb  ischemia.  Strangely, the patient's sx have improved.  The patient has significantly dampened waveforms in his tibial arteries more compatible with imminent critical limb ischemia.  Based on the patient's vascular studies and examination, I have offered the patient: aortogam, bilateral leg runoff, and possible intervention.  At this point, the patient would like to discuss his options with  [name],[name], MD  prior to proceeding.  I discussed in depth with the patient the nature of atherosclerosis, and emphasized the importance of maximal medical management including strict control of blood pressure, blood glucose, and lipid levels, antiplatelet agents, obtaining regular exercise, and cessation of smoking.    The patient is aware that without maximal medical management the underlying atherosclerotic disease process will progress, limiting the benefit of any interventions.  The patient is currently not on a statin or ASA.  He wants his PCP to decided on both.  Thank you for allowing [place] to participate in this patient's care.  Leonides Sake, MD [facility] Office: [phone] Pager: [phone]  09/03/2013, 4:29 PM

## 2013-09-15 ENCOUNTER — Ambulatory Visit: Payer: Medicaid Other | Admitting: Internal Medicine

## 2013-09-22 ENCOUNTER — Ambulatory Visit (INDEPENDENT_AMBULATORY_CARE_PROVIDER_SITE_OTHER): Payer: Medicaid Other | Admitting: Internal Medicine

## 2013-09-22 ENCOUNTER — Encounter: Payer: Self-pay | Admitting: Internal Medicine

## 2013-09-22 VITALS — BP 147/65 | HR 87 | Temp 97.9°F | Resp 14 | Ht 67.5 in | Wt 158.0 lb

## 2013-09-22 DIAGNOSIS — F329 Major depressive disorder, single episode, unspecified: Secondary | ICD-10-CM

## 2013-09-22 DIAGNOSIS — I70219 Atherosclerosis of native arteries of extremities with intermittent claudication, unspecified extremity: Secondary | ICD-10-CM

## 2013-09-22 DIAGNOSIS — G4700 Insomnia, unspecified: Secondary | ICD-10-CM | POA: Insufficient documentation

## 2013-09-22 DIAGNOSIS — F32A Depression, unspecified: Secondary | ICD-10-CM | POA: Insufficient documentation

## 2013-09-22 DIAGNOSIS — I739 Peripheral vascular disease, unspecified: Secondary | ICD-10-CM | POA: Insufficient documentation

## 2013-09-22 NOTE — Progress Notes (Signed)
  Subjective:    Patient ID: Jeffrey Snow, male    DOB: 10/27/1956, 57 y.o.   MRN: [id number]  HPI:Pt is here to establish care for primary care services. Pt has Hepatitis C and has a "spot" in the liver. He has been ordered  a liver biopsy but has not yet scheduled it. Pt states that he has no active problems at present.    Review of Systems  Constitutional: Negative.   HENT: Negative.   Eyes: Negative.   Respiratory: Negative.   Cardiovascular: Negative.   Gastrointestinal: Negative.   Endocrine: Negative.   Genitourinary: Negative.   Musculoskeletal: Negative.   Skin: Negative.   Allergic/Immunologic: Negative.   Neurological: Negative.   Hematological: Negative.   Psychiatric/Behavioral: Negative.        Objective:   Physical Exam  Constitutional: He is oriented to person, place, and time. He appears well-developed and well-nourished.  HENT:  Head: Atraumatic.  Eyes: Conjunctivae and EOM are normal. Pupils are equal, round, and reactive to light. No scleral icterus.  Neck: Normal range of motion. Neck supple.  Cardiovascular: Normal rate and regular rhythm.  Exam reveals no gallop and no friction rub.   No murmur heard. Pulmonary/Chest: Effort normal and breath sounds normal. He has no wheezes. He has no rales. He exhibits no tenderness.  Abdominal: Soft. Bowel sounds are normal. He exhibits no mass.  Musculoskeletal: Normal range of motion.  Neurological: He is alert and oriented to person, place, and time.  Skin: Skin is warm and dry.  Psychiatric: He has a normal mood and affect. His behavior is normal. Judgment and thought content normal.          Assessment & Plan:  1. Hepatitis C: Pt under care of Dr. Madilyn Fireman and is awaiting a liver biopsy for a spot on the liver. Pt to follow up with Dr. Madilyn Fireman after biopsy.  2. Claudication: Pt denies and leg pain  3. Pt under care of Dr. Imogene Burn who has recommended angiography. Pt has not decided if he wants to proceed  with the angiogram.  4. Insomnia: Continue Ambien  5. Depression: Continue prozac  6. Anxiety: Continue Xanax.  7. Thrombocytopenia: Pt has been seen by Dr. Arbutus Ped for thrombocytopenia. Will review records from Dr. Mayford Knife.  Pt had labs performed in last month at Dr. Mayford Knife office. Will review labs and make further decisions. Pt to follow-up after he has had the biopsy.

## 2013-10-04 ENCOUNTER — Telehealth: Payer: Self-pay

## 2013-10-04 NOTE — Telephone Encounter (Signed)
This CM has faxed a request to change Medicaid [place] Access to [facility]/ Dr. [name] Royalty

## 2013-10-21 ENCOUNTER — Other Ambulatory Visit: Payer: Self-pay

## 2013-10-26 ENCOUNTER — Ambulatory Visit (INDEPENDENT_AMBULATORY_CARE_PROVIDER_SITE_OTHER): Payer: Medicaid Other | Admitting: Internal Medicine

## 2013-10-26 ENCOUNTER — Telehealth: Payer: Self-pay | Admitting: *Deleted

## 2013-10-26 ENCOUNTER — Encounter: Payer: Self-pay | Admitting: Internal Medicine

## 2013-10-26 VITALS — BP 152/67 | HR 84 | Temp 97.6°F | Resp 18 | Ht 67.0 in | Wt 168.0 lb

## 2013-10-26 DIAGNOSIS — I739 Peripheral vascular disease, unspecified: Secondary | ICD-10-CM | POA: Insufficient documentation

## 2013-10-26 DIAGNOSIS — I70219 Atherosclerosis of native arteries of extremities with intermittent claudication, unspecified extremity: Secondary | ICD-10-CM

## 2013-10-26 DIAGNOSIS — W19XXXA Unspecified fall, initial encounter: Secondary | ICD-10-CM

## 2013-10-26 DIAGNOSIS — I771 Stricture of artery: Secondary | ICD-10-CM | POA: Insufficient documentation

## 2013-10-26 NOTE — Progress Notes (Signed)
Subjective:    Patient ID: Jeffrey Snow, male    DOB: 1956-06-30, 57 y.o.   MRN: [id number]  HPI: Pt her after he had a mechanical fall 2 nights ago. He states that he fell going to the bathroom. He denies and dizziness, syncope and states that his night light was out and he was unable to see his foot placement leading to a fall. He sustained a contusion on right lower quadrant and right anterior chest wall.   He also has a complaint of his right foot being more numb than usual. To recap, on his last visit Jeffrey Snow reported that he had been advised by Dr. Imogene Snow (vascular) that he needed an angiogram and likely stent placement. Pt at that time was not open to eithert the angiogram or the stent. He is now open to the idea and concerned. He also reports symptoms of increased claudication with LE pain occurring with shorter distances. He describes pain as aching and he also has burning  pain at night which has not improved with gabapentin (taking 3 months).    Review of Systems  Constitutional: Negative.   HENT: Negative.   Eyes: Negative.   Respiratory: Negative.   Cardiovascular:       Claudication pain RLE>LLE.  Gastrointestinal:       Bruise RLE  Endocrine: Negative.   Genitourinary: Negative.   Skin: Negative.   Allergic/Immunologic: Negative.   Neurological: Negative.   Hematological: Negative.   Psychiatric/Behavioral: Negative.        Objective:   Physical Exam  Constitutional: He is oriented to person, place, and time. He appears well-developed and well-nourished.  HENT:  Head: Atraumatic.  Eyes: Conjunctivae and EOM are normal. Pupils are equal, round, and reactive to light. No scleral icterus.  Neck: Normal range of motion. Neck supple.  Cardiovascular: Normal rate, regular rhythm and intact distal pulses.  Exam reveals no gallop and no friction rub.   No murmur heard. Pt has palpable dorsalis and tibialis posterior pulses.  Pulmonary/Chest: Effort normal and  breath sounds normal. He has no wheezes. He has no rales. He exhibits no tenderness.  Abdominal: Soft. Bowel sounds are normal. He exhibits no mass. There is tenderness (at RLQ at area of contusion.). There is no rebound and no guarding.  Musculoskeletal: Normal range of motion. He exhibits edema (BLE edema 1+).  Neurological: He is alert and oriented to person, place, and time.  Skin: Skin is warm and dry.  Decreased warmth to right foot as compared to left.  Psychiatric: He has a normal mood and affect. His behavior is normal. Judgment and thought content normal.          Assessment & Plan:  1. Intermittent Claudication: Pt has known arterial insufficiency leading to intermittent claudication. He is now agreeable to pursuing arteriogram and possible stent placement. Will refer back to Dr. Imogene Snow. Continue ASA 81 mg. Pt has an area of discoloration on the dorsal surface of right foot. This could be related to recent trauma but cannot ignore the possibility of arterial insufficiency sequela.  2. Neuropathic pain: Pt has pain which is neuropathic in description. Continue gabapentin. Unlikely that pain will resolve until circulation adequately restored. Continue Gabapentin.  3.HTN: BP Elevated but patient taking only 1/2 of prescribed dose of 100/25 mg. Will likely increase to a full pill after renal function evaluated on next visit if BP still elevated. However if patient to have dye study, would rather have a slightly elevated BP than  a low BP that may contribute to ATN.   4. Hepatitis C: Pt to follow up with GI (Jeffrey Snow).

## 2013-10-26 NOTE — Telephone Encounter (Signed)
Pt. To see Dr. Imogene Burn R foot dark on the side appointment Friday 10/29/13 @ 9 am arrive 10 min early

## 2013-10-28 ENCOUNTER — Encounter: Payer: Self-pay | Admitting: Vascular Surgery

## 2013-10-29 ENCOUNTER — Ambulatory Visit: Payer: Medicaid Other | Admitting: Vascular Surgery

## 2013-11-15 ENCOUNTER — Other Ambulatory Visit: Payer: Self-pay | Admitting: Internal Medicine

## 2013-11-15 ENCOUNTER — Telehealth: Payer: Self-pay | Admitting: Internal Medicine

## 2013-11-15 DIAGNOSIS — B86 Scabies: Secondary | ICD-10-CM

## 2013-11-15 MED ORDER — PERMETHRIN 5 % EX CREA: TOPICAL_CREAM | CUTANEOUS | Status: DC

## 2013-11-15 NOTE — Telephone Encounter (Signed)
Spoke Pt. About Rx called to [facility] on [address] He had questions about what if its not scabies/ bed bugs or lice what should he do. He used lice shampoo for 10 min and then showered.

## 2013-11-15 NOTE — Progress Notes (Signed)
Pt called with c/o scabies infestation. Pt not examined but unable to come to office for evaluation. Will treat empirically for scabies with Topical Permetherin 5% cream.

## 2013-11-18 ENCOUNTER — Telehealth: Payer: Self-pay | Admitting: *Deleted

## 2013-11-18 NOTE — Telephone Encounter (Addendum)
Pt states he is concerned about red capillary in legs today. He called on Monday 11/15/13 Rx for cream for scabies. He has been using the cream but does not think it is working. I advised him to give the medicine at least 7-10 days and he called back on 11/18/13 asking what to do I offered an appointment for 11/19/13 @11 :30 am he states he does not have $3.00 co-pay so pt will call back next week if he is not any better.

## 2013-12-02 ENCOUNTER — Telehealth: Payer: Self-pay | Admitting: *Deleted

## 2013-12-02 ENCOUNTER — Encounter: Payer: Self-pay | Admitting: Vascular Surgery

## 2013-12-02 NOTE — Telephone Encounter (Addendum)
Pt called about red spots on legs seem to be getting better but they are still there. Pt states he has an appointment with Dr. Imogene Burn on 12/03/13 will ask him about it. Pt states he knows what bed bugs look like and he is not sure what's going on. He has used one tube of cream and now on the second tube of cream.  He will call after the appointment to confirm if he needs to keep the appointment on 12/08/13 at 1:15 pm.

## 2013-12-03 ENCOUNTER — Telehealth: Payer: Self-pay | Admitting: Internal Medicine

## 2013-12-03 ENCOUNTER — Encounter: Payer: Self-pay | Admitting: Vascular Surgery

## 2013-12-03 ENCOUNTER — Ambulatory Visit (INDEPENDENT_AMBULATORY_CARE_PROVIDER_SITE_OTHER): Payer: Medicaid Other | Admitting: Vascular Surgery

## 2013-12-03 VITALS — BP 134/73 | HR 68 | Ht 67.0 in | Wt 168.0 lb

## 2013-12-03 DIAGNOSIS — I70219 Atherosclerosis of native arteries of extremities with intermittent claudication, unspecified extremity: Secondary | ICD-10-CM

## 2013-12-03 DIAGNOSIS — I739 Peripheral vascular disease, unspecified: Secondary | ICD-10-CM | POA: Insufficient documentation

## 2013-12-03 NOTE — Progress Notes (Addendum)
  Established Intermittent Claudication   History of Present Illness  Jeffrey Snow is a 57 y.o. male (08/15/1956) male who presents with chief complaint: darkening R foot.  The patient's symptoms have worsen.  Recently he developed BLE rashes.  The patient's symptoms are: consistent with intermittent claudication. The patient's treatment regimen currently included: maximal medical management.  He was previously offered diagnostic angiography and the patient declined.  The patient denies any wound or any gangrene.  Past Medical History  Diagnosis Date  . Depression   . Anxiety   . Panic attack   . Hypertension   . Hepatitis C   . Claudication of left lower extremity m-1    Past Surgical History  Procedure Laterality Date  . Cosmetic surgery      facial reconstructive surgery    History   Social History  . Marital Status: Married    Spouse Name: N/A    Number of Children: N/A  . Years of Education: N/A   Occupational History  . Not on file.   Social History Main Topics  . Smoking status: Former Smoker -- 0.50 packs/day    Quit date: 08/21/2011  . Smokeless tobacco: Not on file  . Alcohol Use: 3.6 oz/week    6 Cans of beer per week  . Drug Use: No     Comment: abused cocaine in the 80's  . Sexual Activity: Not on file   Other Topics Concern  . Not on file   Social History Narrative  . No narrative on file    Family History  Problem Relation Age of Onset  . Diabetes Father   . Diabetes Brother   . Diabetes Brother      Current Outpatient Prescriptions on File Prior to Visit  Medication Sig Dispense Refill  . ALPRAZolam (XANAX) 0.25 MG tablet Take 0.25 mg by mouth 3 (three) times daily.        . aspirin 81 MG tablet Take 81 mg by mouth daily.      . FLUoxetine (PROZAC) 10 MG capsule Take 10 mg by mouth daily.        . gabapentin (NEURONTIN) 300 MG capsule Take 300 mg by mouth 3 (three) times daily.      . losartan-hydrochlorothiazide (HYZAAR) 100-25  MG per tablet Take 0.5 tablets by mouth daily.       . permethrin (ELIMITE) 5 % cream Apply cream from head to toe; leave on for 8-14 hours before washing off with water; may reapply in 1 week if live mites appear.  60 g  1  . zolpidem (AMBIEN) 10 MG tablet Take 10 mg by mouth at bedtime as needed.       No current facility-administered medications on file prior to visit.    No Known Allergies  REVIEW OF SYSTEMS:  (Positives checked otherwise negative)  CARDIOVASCULAR:  [] chest pain, [] chest pressure, [] palpitations, [] shortness of breath when laying flat, [] shortness of breath with exertion,  [x] pain in feet when walking, [] pain in feet when laying flat, [] history of blood clot in veins (DVT), [] history of phlebitis, [] swelling in legs, [] varicose veins  PULMONARY:  [] productive cough, [] asthma, [] wheezing  NEUROLOGIC:  [] weakness in arms or legs, [] numbness in arms or legs, [] difficulty speaking or slurred speech, [] temporary loss of vision in one eye, [] dizziness  HEMATOLOGIC:  [] bleeding problems, [] problems with blood clotting too easily    MUSCULOSKEL:  [] joint pain, [] joint swelling  GASTROINTEST:  [] vomiting blood, [] blood in stool     GENITOURINARY:  [] burning with urination, [] blood in urine  PSYCHIATRIC:  [] history of major depression  INTEGUMENTARY:  [] rashes, [] ulcers   Physical Examination  Filed Vitals:   12/03/13 1450  BP: 134/73  Pulse: 68  Height: 5' 7" (1.702 m)  Weight: 168 lb (76.204 kg)  SpO2: 98%   Body mass index is 26.31 kg/(m^2).  General: A&O x 3, WDWN   Pulmonary: Sym exp, good air movt, CTAB, no rales, rhonchi, & wheezing   Cardiac: RRR, Nl S1, S2, no Murmurs, rubs or gallops   Vascular:  Vessel  Right  Left   Radial  Palpable  Palpable   Brachial  Palpable  Palpable   Carotid  Palpable, without bruit  Palpable, without bruit   Aorta  Not palpable  N/A   Femoral  Palpable  Palpable   Popliteal  Not palpable   Not palpable   PT  Not Palpable  Not Palpable   DP  Not Palpable  Not Palpable    Gastrointestinal: soft, NTND, -G/R, - HSM, - masses, - CVAT B   Musculoskeletal: M/S 5/5 throughout except 3/5 PF R foot, Extremities diffuse papular rash, B foot cyanosis likely due to CVI, +varicose veins  Neurologic: Pain and light touch intact in extremities except decreased in R foot, Motor exam as listed above   Medical Decision Making  Jeffrey Snow is a 57 y.o. male who presents with: bilateral leg intermittent claudication without evidence of critical limb ischemia.  Based on the patient's vascular studies and examination, I have offered the patient: aortogam, bilateral leg runoff, and possible intervention.  I discussed with the patient the nature of angiographic procedures, especially the limited patencies of any endovascular intervention.  The patient is aware of that the risks of an angiographic procedure include but are not limited to: bleeding, infection, access site complications, renal failure, embolization, rupture of vessel, dissection, possible need for emergent surgical intervention, possible need for surgical procedures to treat the patient's pathology, anaphylactic reaction to contrast, and stroke and death.   The patient is aware of the risks and agrees to proceed. I discussed in depth with the patient the nature of atherosclerosis, and emphasized the importance of maximal medical management including strict control of blood pressure, blood glucose, and lipid levels, antiplatelet agents, obtaining regular exercise, and cessation of smoking.  The patient is aware that without maximal medical management the underlying atherosclerotic disease process will progress, limiting the benefit of any interventions.  The patient is currently on ASA but not a statin per his PCP. Thank you for allowing us to participate in this patient's care.  Avonelle Viveros, MD [facility]  [place] Office: [phone] Pager: [phone]  12/03/2013, 3:12 PM    

## 2013-12-06 ENCOUNTER — Telehealth: Payer: Self-pay | Admitting: *Deleted

## 2013-12-06 NOTE — Telephone Encounter (Signed)
Pt states he did not use Elimite 5% cream correctly. He put cream on when he felt like it and not as directed. He has used one tube and is now on the second tube. He has an appointment 12/08/13 at 1:15 pm

## 2013-12-07 ENCOUNTER — Encounter: Payer: Self-pay | Admitting: *Deleted

## 2013-12-07 ENCOUNTER — Telehealth: Payer: Self-pay | Admitting: Internal Medicine

## 2013-12-07 ENCOUNTER — Telehealth: Payer: Self-pay | Admitting: *Deleted

## 2013-12-07 NOTE — Telephone Encounter (Signed)
Per note 12/07/2013 with CMA, patient doesn't need this refilled at this time.  Will close this request.

## 2013-12-07 NOTE — Telephone Encounter (Signed)
Pt called states legs are getting better also thinks it came from rust in the water. No refill on Elimite 5% at this time. He also cx appointment for 12/08/13 @1 :15 pm

## 2013-12-08 ENCOUNTER — Ambulatory Visit: Payer: Medicaid Other | Admitting: Internal Medicine

## 2013-12-20 ENCOUNTER — Ambulatory Visit (INDEPENDENT_AMBULATORY_CARE_PROVIDER_SITE_OTHER): Payer: Medicaid Other | Admitting: *Deleted

## 2013-12-20 ENCOUNTER — Other Ambulatory Visit: Payer: Self-pay

## 2013-12-20 ENCOUNTER — Telehealth: Payer: Self-pay | Admitting: Internal Medicine

## 2013-12-20 VITALS — BP 124/59 | HR 74 | Temp 97.5°F | Resp 16 | Ht 68.0 in | Wt 170.0 lb

## 2013-12-20 DIAGNOSIS — Z0000 Encounter for general adult medical examination without abnormal findings: Secondary | ICD-10-CM

## 2013-12-20 DIAGNOSIS — Z23 Encounter for immunization: Secondary | ICD-10-CM

## 2013-12-20 MED ORDER — INFLUENZA VAC SPLIT QUAD 0.5 ML IM SUSP: 0.5000 mL | Freq: Once | INTRAMUSCULAR | Status: DC

## 2013-12-20 NOTE — Telephone Encounter (Signed)
Patient called to change appointment for flu shot.

## 2013-12-21 ENCOUNTER — Telehealth: Payer: Self-pay | Admitting: *Deleted

## 2013-12-21 NOTE — Telephone Encounter (Signed)
Pt requested refill for Elimite 5% cream for scabies. He states he does not have another $3.00 co-pay he was just in for flu vaccine on 12/20/13 however he changed the time to an earlier time to an AM appointment so Dr. Zigmund Daniel was rounding in hospital. He has a $ 200.00 light bill. Not sure when he will be able to come in.

## 2013-12-26 MED ORDER — SODIUM CHLORIDE 0.9 % IV SOLN
INTRAVENOUS | Status: DC
  Administered 2013-12-27: 100 mL/h via INTRAVENOUS

## 2013-12-27 ENCOUNTER — Encounter (HOSPITAL_COMMUNITY)
Admission: RE | Disposition: A | Discharge status: Home / Self Care | Payer: Self-pay | Source: Ambulatory Visit | Attending: Vascular Surgery

## 2013-12-27 ENCOUNTER — Ambulatory Visit (HOSPITAL_COMMUNITY)
Admission: RE | Admit: 2013-12-27 | Discharge: 2013-12-27 | Disposition: A | Discharge status: Home / Self Care | Payer: Medicaid Other | Source: Ambulatory Visit | Attending: Vascular Surgery | Admitting: Vascular Surgery

## 2013-12-27 ENCOUNTER — Encounter (HOSPITAL_COMMUNITY): Payer: Self-pay | Admitting: *Deleted

## 2013-12-27 DIAGNOSIS — I739 Peripheral vascular disease, unspecified: Secondary | ICD-10-CM | POA: Insufficient documentation

## 2013-12-27 DIAGNOSIS — I70219 Atherosclerosis of native arteries of extremities with intermittent claudication, unspecified extremity: Secondary | ICD-10-CM

## 2013-12-27 HISTORY — PX: LOWER EXTREMITY ANGIOGRAM: SHX5508

## 2013-12-27 HISTORY — PX: ABDOMINAL AORTAGRAM: SHX5454

## 2013-12-27 LAB — POCT I-STAT, CHEM 8
BUN: 7 mg/dL (ref 6–23)
Calcium, Ion: 1.13 mmol/L (ref 1.12–1.23)
Chloride: 97 mEq/L (ref 96–112)
Creatinine, Ser: 1.4 mg/dL — ABNORMAL HIGH (ref 0.50–1.35)
Glucose, Bld: 84 mg/dL (ref 70–99)
HCT: 43 % (ref 39.0–52.0)
Hemoglobin: 14.6 g/dL (ref 13.0–17.0)
Potassium: 3.7 mEq/L (ref 3.7–5.3)
Sodium: 132 mEq/L — ABNORMAL LOW (ref 137–147)
TCO2: 21 mmol/L (ref 0–100)

## 2013-12-27 SURGERY — ABDOMINAL AORTAGRAM
Anesthesia: LOCAL

## 2013-12-27 MED ORDER — SODIUM CHLORIDE 0.9 % IV SOLN: 1.0000 mL/kg/h | INTRAVENOUS | Status: DC

## 2013-12-27 MED ORDER — ACETAMINOPHEN 325 MG PO TABS: 650.0000 mg | ORAL_TABLET | ORAL | Status: DC | PRN

## 2013-12-27 MED ORDER — HEPARIN (PORCINE) IN NACL 2-0.9 UNIT/ML-% IJ SOLN
INTRAMUSCULAR | Status: AC
  Filled 2013-12-27: qty 1000

## 2013-12-27 MED ORDER — OXYCODONE-ACETAMINOPHEN 5-325 MG PO TABS: 1.0000 | ORAL_TABLET | ORAL | Status: DC | PRN

## 2013-12-27 MED ORDER — MIDAZOLAM HCL 2 MG/2ML IJ SOLN
INTRAMUSCULAR | Status: AC
  Filled 2013-12-27: qty 2

## 2013-12-27 MED ORDER — FENTANYL CITRATE 0.05 MG/ML IJ SOLN
INTRAMUSCULAR | Status: AC
  Filled 2013-12-27: qty 2

## 2013-12-27 MED ORDER — LIDOCAINE HCL (PF) 1 % IJ SOLN
INTRAMUSCULAR | Status: AC
  Filled 2013-12-27: qty 30

## 2013-12-27 MED ORDER — ONDANSETRON HCL 4 MG/2ML IJ SOLN: 4.0000 mg | Freq: Four times a day (QID) | INTRAMUSCULAR | Status: DC | PRN

## 2013-12-27 NOTE — Interval H&P Note (Signed)
[facility]   History and Physical Update  The patient was interviewed and re-examined.  The patient's previous History and Physical has been reviewed and is unchanged.  There is no change in the plan of care: aortogram, bilateral leg runoff, and possible intervention.  Adele Barthel, MD [facility] [place] Office: [phone] Pager: [phone]

## 2013-12-27 NOTE — Op Note (Signed)
OPERATIVE NOTE   PROCEDURE: 1.  Left common femoral artery cannulation under ultrasound guidance 2.  Placement of catheter in aorta 3.  Aortogram 4.  Second order arterial selection 5.  Right runoff 6.  Left runoff  PRE-OPERATIVE DIAGNOSIS: Bilateral intermittent claudication   POST-OPERATIVE DIAGNOSIS: same as above   SURGEON: Adele Barthel, MD  ANESTHESIA: conscious sedation  ESTIMATED BLOOD LOSS: 50 cc  CONTRAST: 90 cc  FINDING(S):  Aorta: patent  Superior mesenteric artery: patent Celiac artery: patent   Right Left  RA patent patent  CIA patent patnt  EIA Patent, calcification in mid-segment but no corresponding clinically significant stenosis patent  IIA patent patent  CFA Patent Patent  SFA Patent proximally but diffusely diseased with calcification, distal occlusion with extensive calcification Patent with 75% stenosis in proximal segment  PFA Patent Patent  Pop Occluded proximal segment with rapid reconstitution via extensive collaterals Occluded segment at-knee segment with reconstitution of distal segment via geniculate collaterals  Trif Patent Reconstitutes via geniculate collaterals  AT Patent Reconstitutes via geniculate collaterals  Pero Patent Reconstitutes via geniculate collaterals  PT Patent Reconstitutes via geniculate collaterals   SPECIMEN(S):  none  INDICATIONS:   Jeffrey Snow is a 58 y.o. male who presents with bilateral intermittent claudication.  The patient presents for: aortogram, bilateral leg runoff, and possible intervention.  I discussed with the patient the nature of angiographic procedures, especially the limited patencies of any endovascular intervention.  The patient is aware of that the risks of an angiographic procedure include but are not limited to: bleeding, infection, access site complications, renal failure, embolization, rupture of vessel, dissection, possible need for emergent surgical intervention, possible need for  surgical procedures to treat the patient's pathology, and stroke and death.  The patient is aware of the risks and agrees to proceed.  DESCRIPTION: After full informed consent was obtained from the patient, the patient was brought back to the angiography suite.  The patient was placed supine upon the angiography table and connected to monitoring equipment.  The patient was then given conscious sedation, the amounts of which are documented in the patient's chart.  The patient was prepped and drape in the standard fashion for an angiographic procedure.  At this point, attention was turned to the left groin.  Under ultrasound guidance, the left common femoral artery was cannulated with a micropuncture needle.  The microwire was advanced into the iliac arterial system.  The needle was exchanged for a microsheath, which was loaded into the common femoral artery over the wire.  The microwire was exchanged for a [facility] wire which was advanced into the aorta.  The microsheath was then exchanged for a 5-Fr sheath which was loaded into the common femoral artery.  The Omniflush catheter was then loaded over the wire up to the level of L1.  The catheter was connected to the power injector circuit.  After de-airring and de-clotting the circuit, a power injector aortogram was completed.  The [facility] wire was replaced in the catheter, and using the [place] and Omniflush catheter, the right common iliac artery was selected.  The catheter and wire were advanced into the external iliac artery.  An automated right leg runoff was completed, the findings are listed above.  Based on the severe calcification evident, I doubt recannulation is possible via a endovascular technique.  The patient has extensive collateralization with brisk filling of the proximal popliteal artery despite the segmental occlusion.  I replaced the wire into the catheter, straightening out  the crook in the catheter.  Both were removed from the sheath together.   The sheath was aspirated.  No clots were present and the sheath was reloaded with heparinized saline.  The left sheath was connected to the power injector circuit.  An automated left leg runoff was completed, the findings are listed above.  Despite the short segment occlusion in the below-the-knee popliteal artery, the patient has vigorous reconstitution of the left tibial arteries via collaterals.  The sheath was aspirated.  No clots were present and the sheath was reloaded with heparinized saline.    Based on the images, I doubt this patient's anesthesia in his right leg is due to poor perfusion.  He is not at immediate risk for limb loss.  In a similar fashion, his left leg is not at immediate risk for limb loss.  I feel a maximal medical mangement approach would be most beneficial in this patient.  Further work-up for the etiology of this patient's neuropathic symptoms is indicated.    COMPLICATIONS: none  CONDITION: stable  Adele Barthel, MD [facility] [place] Office: [phone] Pager: [phone]  12/27/2013, 12:35 PM

## 2013-12-27 NOTE — H&P (View-Only) (Signed)
Established Intermittent Claudication   History of Present Illness  Jeffrey Snow is a 58 y.o. male (1956-08-13) male who presents with chief complaint: darkening R foot.  The patient's symptoms have worsen.  Recently he developed BLE rashes.  The patient's symptoms are: consistent with intermittent claudication. The patient's treatment regimen currently included: maximal medical management.  He was previously offered diagnostic angiography and the patient declined.  The patient denies any wound or any gangrene.  Past Medical History  Diagnosis Date  . Depression   . Anxiety   . Panic attack   . Hypertension   . Hepatitis C   . Claudication of left lower extremity m-1    Past Surgical History  Procedure Laterality Date  . Cosmetic surgery      facial reconstructive surgery    History   Social History  . Marital Status: Married    Spouse Name: N/A    Number of Children: N/A  . Years of Education: N/A   Occupational History  . Not on file.   Social History Main Topics  . Smoking status: Former Smoker -- 0.50 packs/day    Quit date: 08/21/2011  . Smokeless tobacco: Not on file  . Alcohol Use: 3.6 oz/week    6 Cans of beer per week  . Drug Use: No     Comment: abused cocaine in the 80's  . Sexual Activity: Not on file   Other Topics Concern  . Not on file   Social History Narrative  . No narrative on file    Family History  Problem Relation Age of Onset  . Diabetes Father   . Diabetes Brother   . Diabetes Brother      Current Outpatient Prescriptions on File Prior to Visit  Medication Sig Dispense Refill  . ALPRAZolam (XANAX) 0.25 MG tablet Take 0.25 mg by mouth 3 (three) times daily.        Marland Kitchen aspirin 81 MG tablet Take 81 mg by mouth daily.      Marland Kitchen FLUoxetine (PROZAC) 10 MG capsule Take 10 mg by mouth daily.        Marland Kitchen gabapentin (NEURONTIN) 300 MG capsule Take 300 mg by mouth 3 (three) times daily.      Marland Kitchen losartan-hydrochlorothiazide (HYZAAR) 100-25  MG per tablet Take 0.5 tablets by mouth daily.       . permethrin (ELIMITE) 5 % cream Apply cream from head to toe; leave on for 8-14 hours before washing off with water; may reapply in 1 week if live mites appear.  60 g  1  . zolpidem (AMBIEN) 10 MG tablet Take 10 mg by mouth at bedtime as needed.       No current facility-administered medications on file prior to visit.    No Known Allergies  REVIEW OF SYSTEMS:  (Positives checked otherwise negative)  CARDIOVASCULAR:  []  chest pain, []  chest pressure, []  palpitations, []  shortness of breath when laying flat, []  shortness of breath with exertion,  [x]  pain in feet when walking, []  pain in feet when laying flat, []  history of blood clot in veins (DVT), []  history of phlebitis, []  swelling in legs, []  varicose veins  PULMONARY:  []  productive cough, []  asthma, []  wheezing  NEUROLOGIC:  []  weakness in arms or legs, []  numbness in arms or legs, []  difficulty speaking or slurred speech, []  temporary loss of vision in one eye, []  dizziness  HEMATOLOGIC:  []  bleeding problems, []  problems with blood clotting too easily  MUSCULOSKEL:  []  joint pain, []  joint swelling  GASTROINTEST:  []  vomiting blood, []  blood in stool     GENITOURINARY:  []  burning with urination, []  blood in urine  PSYCHIATRIC:  []  history of major depression  INTEGUMENTARY:  []  rashes, []  ulcers   Physical Examination  Filed Vitals:   12/03/13 1450  BP: 134/73  Pulse: 68  Height: 5\' 7"  (1.702 m)  Weight: 168 lb (76.204 kg)  SpO2: 98%   Body mass index is 26.31 kg/(m^2).  General: A&O x 3, WDWN   Pulmonary: Sym exp, good air movt, CTAB, no rales, rhonchi, & wheezing   Cardiac: RRR, Nl S1, S2, no Murmurs, rubs or gallops   Vascular:  Vessel  Right  Left   Radial  Palpable  Palpable   Brachial  Palpable  Palpable   Carotid  Palpable, without bruit  Palpable, without bruit   Aorta  Not palpable  N/A   Femoral  Palpable  Palpable   Popliteal  Not palpable   Not palpable   PT  Not Palpable  Not Palpable   DP  Not Palpable  Not Palpable    Gastrointestinal: soft, NTND, -G/R, - HSM, - masses, - CVAT B   Musculoskeletal: M/S 5/5 throughout except 3/5 PF R foot, Extremities diffuse papular rash, B foot cyanosis likely due to CVI, +varicose veins  Neurologic: Pain and light touch intact in extremities except decreased in R foot, Motor exam as listed above   Medical Decision Making  Jeffrey Snow is a 58 y.o. male who presents with: bilateral leg intermittent claudication without evidence of critical limb ischemia.  Based on the patient's vascular studies and examination, I have offered the patient: aortogam, bilateral leg runoff, and possible intervention.  I discussed with the patient the nature of angiographic procedures, especially the limited patencies of any endovascular intervention.  The patient is aware of that the risks of an angiographic procedure include but are not limited to: bleeding, infection, access site complications, renal failure, embolization, rupture of vessel, dissection, possible need for emergent surgical intervention, possible need for surgical procedures to treat the patient's pathology, anaphylactic reaction to contrast, and stroke and death.   The patient is aware of the risks and agrees to proceed. I discussed in depth with the patient the nature of atherosclerosis, and emphasized the importance of maximal medical management including strict control of blood pressure, blood glucose, and lipid levels, antiplatelet agents, obtaining regular exercise, and cessation of smoking.  The patient is aware that without maximal medical management the underlying atherosclerotic disease process will progress, limiting the benefit of any interventions.  The patient is currently on ASA but not a statin per his PCP. Thank you for allowing [place] to participate in this patient's care.  Adele Barthel, MD [facility]  [place] Office: [phone] Pager: [phone]  12/03/2013, 3:12 PM

## 2013-12-28 ENCOUNTER — Telehealth: Payer: Self-pay | Admitting: Vascular Surgery

## 2013-12-28 ENCOUNTER — Telehealth: Payer: Self-pay | Admitting: Internal Medicine

## 2013-12-28 ENCOUNTER — Telehealth: Payer: Self-pay | Admitting: *Deleted

## 2013-12-28 NOTE — Telephone Encounter (Signed)
Pt needs to call Dr. Lianne Moris office as this is associated with procedure done by Dr. Bridgett Larsson.

## 2013-12-28 NOTE — Telephone Encounter (Addendum)
Message copied by Gena Fray on Tue Dec 28, 2013 11:17 AM ------      Message from: Peter Minium K      Created: Mon Dec 27, 2013  5:00 PM      Regarding: schedule                   ----- Message -----         From: Alfonso Patten, RN         Sent: 12/27/2013   4:28 PM           To: [facility]                        ----- Message -----         From: Conrad Arthur, MD         Sent: 12/27/2013  12:48 PM           To: Patrici Ranks, Alfonso Patten, RN            Jeffrey Snow      [id number]      August 13, 1956            PROCEDURE:      1.  Left common femoral artery cannulation under ultrasound guidance      2.  Placement of catheter in aorta      3.  Aortogram      4.  Second order arterial selection      5.  Right runoff      6.  Left runoff            Follow-up: 4 weeks       ------  12/28/13: spoke with pt, he said he wasn't sure why we needed to follow up if Dr Bridgett Larsson didn't find anything... He said that he finally got his PCP to call in pain meds, so he isn't sure he wants to come in. I transferred him to our triage to explain our protocol, dpm

## 2013-12-28 NOTE — Telephone Encounter (Signed)
Pt called about foot pain would like pain medication still having same pain after procedure with Dr. Bridgett Larsson

## 2013-12-28 NOTE — Telephone Encounter (Signed)
Patient returned call of RMA. Advised patient to expect call from Dr. Lianne Moris office re: his pain. Patient advised he did not wish to speak with anyone from Dr. Lianne Moris office. Reminded patient of his appointment on 01/04/14 and patient advised me to cancel that appointment. Patient stated he is looking for another PCP.

## 2013-12-28 NOTE — Telephone Encounter (Addendum)
Left a message to inform pt  Dr. Bridgett Larsson office will be contacting him about the pain in his foot concerning pain medication

## 2014-01-03 ENCOUNTER — Telehealth: Payer: Self-pay

## 2014-01-03 NOTE — Telephone Encounter (Signed)
Phone call from pt.  Reports a "small bruise" in the left groin;  Describes as a "raspberry birthmark".  States it is not raised; states "it is level with the skin".  Denies any swelling or inflammation surrounding the bruise.  Denies any drainage.  Recommended to continue to monitor the site and to report any progression or worsening symptoms.  Pt. verb. Understanding.

## 2014-01-04 ENCOUNTER — Ambulatory Visit: Payer: Medicaid Other | Admitting: Internal Medicine

## 2014-01-04 ENCOUNTER — Telehealth: Payer: Self-pay | Admitting: Internal Medicine

## 2014-01-04 NOTE — Telephone Encounter (Signed)
Patient called requesting referral to podiatrist. Patient unable to come in for appointment until after he receives his disability check. He will wait until 01/27/14 to see Dr. Zigmund Daniel for referral.

## 2014-01-27 ENCOUNTER — Encounter: Payer: Self-pay | Admitting: Internal Medicine

## 2014-01-27 ENCOUNTER — Ambulatory Visit (INDEPENDENT_AMBULATORY_CARE_PROVIDER_SITE_OTHER): Payer: Medicaid Other | Admitting: Internal Medicine

## 2014-01-27 VITALS — BP 146/75 | HR 78 | Temp 97.7°F | Resp 20 | Ht 67.0 in | Wt 174.0 lb

## 2014-01-27 DIAGNOSIS — I10 Essential (primary) hypertension: Secondary | ICD-10-CM

## 2014-01-27 DIAGNOSIS — K649 Unspecified hemorrhoids: Secondary | ICD-10-CM

## 2014-01-27 DIAGNOSIS — R739 Hyperglycemia, unspecified: Secondary | ICD-10-CM

## 2014-01-27 DIAGNOSIS — Z23 Encounter for immunization: Secondary | ICD-10-CM

## 2014-01-27 DIAGNOSIS — R7309 Other abnormal glucose: Secondary | ICD-10-CM

## 2014-01-27 MED ORDER — HYDROCORTISONE 2.5 % RE CREA: 1.0000 "application " | TOPICAL_CREAM | Freq: Two times a day (BID) | RECTAL | Status: DC

## 2014-01-27 NOTE — Progress Notes (Signed)
   Subjective:    Patient ID: Jeffrey Snow, male    DOB: [birth year], 58 y.o.   MRN: [id number]  HPI: Pt is here for follow-up for HTN and Leg Pain. He had a venogram done on 1/12/17/2012 and that he was found to have good blood flow to the LE's. He states that the pain in his leg is resolved and he is able to ambulate without difficulty.  Pt states that his hemorrhoids have been acting up lately.  Review of Systems  Constitutional: Negative.   HENT: Negative.   Eyes: Negative.   Respiratory: Negative.   Cardiovascular: Negative.   Gastrointestinal: Positive for rectal pain.  Endocrine: Negative.   Genitourinary: Negative.   Skin: Negative.   Allergic/Immunologic: Negative.   Neurological: Negative.   Hematological: Negative.   Psychiatric/Behavioral: Negative.        Objective:   Physical Exam  Constitutional: He is oriented to person, place, and time. He appears well-developed and well-nourished.  HENT:  Head: Atraumatic.  Eyes: Conjunctivae and EOM are normal. Pupils are equal, round, and reactive to light. No scleral icterus.  Neck: Normal range of motion. Neck supple.  Cardiovascular: Normal rate and regular rhythm.  Exam reveals no gallop and no friction rub.   No murmur heard. Pulmonary/Chest: Effort normal and breath sounds normal. He has no wheezes. He has no rales. He exhibits no tenderness.  Abdominal: Soft. Bowel sounds are normal. He exhibits no mass.  Genitourinary:  Inflamed external Hemorrhoids  Musculoskeletal: Normal range of motion.  Neurological: He is alert and oriented to person, place, and time.  Skin: Skin is warm and dry.  Psychiatric: He has a normal mood and affect. His behavior is normal. Judgment and thought content normal.          Assessment & Plan:  1. HTN: BP still not at goal. Will increase Hyzaar to 1 tab 100-25 mg daily.  Check Hb A1c on next labs.  2. CKD: Review of labs show that he had elevated renal function. Cr. On 12/27/2013  was 1.40 which is improved since last year. Will re-check labs in 3 months.  3. Lower Extremitiy Pain:  Resolved.  4. Hemorrhoids: Pt has inflamed external hemorrhoids. Will order Hydrocortisone 2.5% rectal cream   5. Hepatitis C: Pt under care of [name] and has an appointment scheduled.  6.  Immunization: TDAP

## 2014-01-28 ENCOUNTER — Encounter: Payer: Medicaid Other | Admitting: Vascular Surgery

## 2014-01-28 MED ORDER — LOSARTAN POTASSIUM-HCTZ 100-25 MG PO TABS: 1.0000 | ORAL_TABLET | Freq: Every day | ORAL | Status: DC

## 2014-01-31 ENCOUNTER — Encounter: Payer: Self-pay | Admitting: Internal Medicine

## 2014-01-31 NOTE — Progress Notes (Signed)
This RN received patient message via my chart and closed in error. Patient states " Good, except my foot still hurts, burning and tingling on the bottom of my right foot, also toes wont bend very far". Message routed to Dr. Zigmund Daniel.

## 2014-02-11 ENCOUNTER — Other Ambulatory Visit: Payer: Self-pay | Admitting: Hematology

## 2014-02-14 ENCOUNTER — Telehealth: Payer: Self-pay | Admitting: Internal Medicine

## 2014-02-14 NOTE — Telephone Encounter (Signed)
Patient to schedule appointment for foot pain and get referral to podiatrist

## 2014-03-03 ENCOUNTER — Encounter: Payer: Self-pay | Admitting: Internal Medicine

## 2014-03-03 ENCOUNTER — Encounter: Payer: Self-pay | Admitting: Vascular Surgery

## 2014-03-03 ENCOUNTER — Ambulatory Visit (INDEPENDENT_AMBULATORY_CARE_PROVIDER_SITE_OTHER): Payer: Medicaid Other | Admitting: Internal Medicine

## 2014-03-03 VITALS — BP 146/68 | HR 80 | Temp 98.1°F | Resp 18 | Ht 67.0 in | Wt 182.0 lb

## 2014-03-03 DIAGNOSIS — Z515 Encounter for palliative care: Secondary | ICD-10-CM | POA: Insufficient documentation

## 2014-03-03 DIAGNOSIS — R7989 Other specified abnormal findings of blood chemistry: Secondary | ICD-10-CM

## 2014-03-03 DIAGNOSIS — Z1211 Encounter for screening for malignant neoplasm of colon: Secondary | ICD-10-CM

## 2014-03-03 DIAGNOSIS — Z0000 Encounter for general adult medical examination without abnormal findings: Secondary | ICD-10-CM

## 2014-03-03 DIAGNOSIS — M79609 Pain in unspecified limb: Secondary | ICD-10-CM

## 2014-03-03 DIAGNOSIS — R945 Abnormal results of liver function studies: Secondary | ICD-10-CM

## 2014-03-03 DIAGNOSIS — Z7189 Other specified counseling: Secondary | ICD-10-CM | POA: Insufficient documentation

## 2014-03-03 DIAGNOSIS — E559 Vitamin D deficiency, unspecified: Secondary | ICD-10-CM | POA: Insufficient documentation

## 2014-03-03 DIAGNOSIS — I10 Essential (primary) hypertension: Secondary | ICD-10-CM | POA: Insufficient documentation

## 2014-03-03 DIAGNOSIS — R7309 Other abnormal glucose: Secondary | ICD-10-CM

## 2014-03-03 DIAGNOSIS — R739 Hyperglycemia, unspecified: Secondary | ICD-10-CM

## 2014-03-03 LAB — COMPREHENSIVE METABOLIC PANEL
ALT: 45 U/L (ref 0–53)
AST: 70 U/L — ABNORMAL HIGH (ref 0–37)
Albumin: 2.9 g/dL — ABNORMAL LOW (ref 3.5–5.2)
Alkaline Phosphatase: 115 U/L (ref 39–117)
BUN: 7 mg/dL (ref 6–23)
CO2: 22 mEq/L (ref 19–32)
Calcium: 8.2 mg/dL — ABNORMAL LOW (ref 8.4–10.5)
Chloride: 100 mEq/L (ref 96–112)
Creat: 1.13 mg/dL (ref 0.50–1.35)
Glucose, Bld: 104 mg/dL — ABNORMAL HIGH (ref 70–99)
Potassium: 4.6 mEq/L (ref 3.5–5.3)
Sodium: 130 mEq/L — ABNORMAL LOW (ref 135–145)
Total Bilirubin: 4.9 mg/dL — ABNORMAL HIGH (ref 0.2–1.2)
Total Protein: 6.1 g/dL (ref 6.0–8.3)

## 2014-03-03 LAB — SEDIMENTATION RATE: Sed Rate: 4 mm/hr (ref 0–16)

## 2014-03-03 MED ORDER — BENAZEPRIL HCL 40 MG PO TABS: 40.0000 mg | ORAL_TABLET | Freq: Every day | ORAL | Status: DC

## 2014-03-03 MED ORDER — HYDROCHLOROTHIAZIDE 25 MG PO TABS: 25.0000 mg | ORAL_TABLET | Freq: Every day | ORAL | Status: DC

## 2014-03-03 NOTE — Progress Notes (Signed)
   Subjective:    Patient ID: Jeffrey Snow, male    DOB: 1956-07-02, 58 y.o.   MRN: [id number]  HPI: Pt here with complaints of right foot pain. He states that the pain is occuring only in the right foot on all sides. He feels that the foot is not there "as if it's asleep". He states that he seems to be worse when he is walking up a hill. He has no calf calf. He has no other symptoms and denies any injuries.     Review of Systems  Constitutional: Negative.   HENT: Negative.   Eyes: Negative.   Respiratory: Negative.   Cardiovascular: Negative.   Gastrointestinal: Negative.   Endocrine: Negative.   Genitourinary: Negative.   Musculoskeletal:       Right foot pain.  Skin: Negative.   Allergic/Immunologic: Negative.   Neurological: Negative.   Hematological: Negative.   Psychiatric/Behavioral: Negative.        Objective:   Physical Exam  Constitutional: He is oriented to person, place, and time. He appears well-developed and well-nourished. He appears distressed.  HENT:  Head: Atraumatic.  Eyes: Conjunctivae and EOM are normal. Pupils are equal, round, and reactive to light. No scleral icterus.  Neck: Normal range of motion. Neck supple.  Cardiovascular: Normal rate and regular rhythm.  Exam reveals no gallop and no friction rub.   No murmur heard. Pulmonary/Chest: Effort normal and breath sounds normal. He has no wheezes. He has no rales. He exhibits no tenderness.  Abdominal: Soft. Bowel sounds are normal. He exhibits no mass.  Musculoskeletal: Normal range of motion.  Neurological: He is alert and oriented to person, place, and time.  Skin: Skin is warm and dry.  Psychiatric: He has a normal mood and affect. His behavior is normal. Judgment and thought content normal.          Assessment & Plan:  1. Foot pain: Pt has no symptom constellation that points to any preferred diagnosis. He is having this sensation with walking on an incline which is rendering him unable  to perform ADL's. The pain does not have a dermatomal pattern as it involves the whole foot.  Will obtain an ESR, X-ray of foot, I will also obtain a Hb A1c. Consider podiatrist ti evaluate for foot mechanics and possible orthotics. (Note: Pt has had full vascular studies by Dr. Bridgett Larsson in the past 6 months which show no evidence of impaired vascularity).   2. HTN: BP mildly elevated. His insurance has denied Losartan and I will substitute Benzepril 40 mg for this and continue HCTZ. .   3. Elevated LFT: He has had elevated LFT's in the past. I will repeat to ensure that he has not had any further derangement.  4. Cancer Screening: Pt states that he has had a colonoscopy however he is not sure where he was seen. He was seen by [facility] however did not have a colonoscopy performed. Will refer to [facility].  Labs: CMET, ESR, Vitamin D  RTC: 1 month to F/U on foot pain, Annual Physical.

## 2014-03-04 ENCOUNTER — Encounter: Payer: Self-pay | Admitting: Vascular Surgery

## 2014-03-04 ENCOUNTER — Ambulatory Visit (INDEPENDENT_AMBULATORY_CARE_PROVIDER_SITE_OTHER): Payer: Medicaid Other | Admitting: Vascular Surgery

## 2014-03-04 VITALS — BP 118/43 | HR 63 | Resp 16 | Ht 67.0 in | Wt 181.0 lb

## 2014-03-04 DIAGNOSIS — I739 Peripheral vascular disease, unspecified: Secondary | ICD-10-CM

## 2014-03-04 LAB — VITAMIN D 25 HYDROXY (VIT D DEFICIENCY, FRACTURES): Vit D, 25-Hydroxy: 10 ng/mL — ABNORMAL LOW (ref 30–89)

## 2014-03-04 NOTE — Progress Notes (Signed)
    Postoperative Visit   History of Present Illness  Jeffrey Snow is a 58 y.o. male who presents for postoperative follow-up from procedure on Date: 12/27/13: B leg runoff.  That angiogram demonstrated: R distal SFA occlusion with essentially hypertrophied collaterals from the R profunda femoral artery which constitute a bypass of the R distal SFA occlusion, L popliteal artery is occluded at the knee but numerous geniculate arteries bypass the short segment occlusion.  The patient notes stable intermittent claudication sx and persistent numbness in feet.  The patient is able to complete their activities of daily living.    Past Medical History, Past Surgical History, Social History, Family History, Medications, Allergies, and Review of Systems are unchanged from previous evaluation on 12/27/13.  On ROS: no rest pain, no ulcers, +B feet swelling  For VQI Use Only  PRE-ADM LIVING: Home  AMB STATUS: Ambulatory  Physical Examination  Filed Vitals:   03/04/14 1557  BP: 118/43  Pulse: 63  Resp: 16  Height: 5\' 7"  (1.702 m)  Weight: 181 lb (82.101 kg)   Body mass index is 28.34 kg/(m^2).  General: A&O x 3, WDWN  Pulmonary: Sym exp, good air movt, CTAB, no rales, rhonchi, & wheezing  Cardiac: RRR, Nl S1, S2, no Murmurs, rubs or gallops  Vascular: Vessel Right Left  Radial Palpable Palpable  Brachial Palpable Palpable  Carotid Palpable, without bruit Palpable, without bruit  Aorta Not palpable N/A  Femoral Palpable Palpable  Popliteal Not palpable Not palpable  PT Not Palpable Not Palpable  DP Not Palpable Not Palpable   Gastrointestinal: soft, NTND, -G/R, - HSM, - masses, - CVAT B  Musculoskeletal: M/S 5/5 throughout , Extremities without ischemic changes , L groin without hematoma, no echymosis present at cannulation site, B edema 1+, some LDS B, B leg rash improved  Neurologic:  Pain and light touch intact in extremities , Motor exam as listed above  Medical  Decision Making  [name] is a 58 y.o. male who presents s/p B leg runoff, intermittent claudication without critical limb ischemia , peripheral neuropathy of known etiology  I doubt that this patient's peripheral arterial disease accounts for his anesthesia in his feet, as he essentially has chronic hypertrophied collaterals that have bypassed his occlusions. Based on his angiographic findings, this patient needs: q6 months ABI.   We discussed that he can be surgically reconstructed, but as he only has claudication and those bypasses only have 5-yr patencies not 10-yr, it does NOT make sense to proceed at this point. I discussed in depth with the patient the nature of atherosclerosis, and emphasized the importance of maximal medical management including strict control of blood pressure, blood glucose, and lipid levels, obtaining regular exercise, and cessation of smoking.  The patient is aware that without maximal medical management the underlying atherosclerotic disease process will progress, limiting the benefit of any interventions. The patient is currently on a statin.  Pt prefers this to be managed by PCP. The patient is currently on an anti-platelet: ASA.  Thank you for allowing [place] to participate in this patient's care.  Adele Barthel, MD [facility] Office: [phone] Pager: [phone]

## 2014-03-04 NOTE — Addendum Note (Signed)
Addended by: Dorthula Rue L on: 03/04/2014 07:13 PM   Modules accepted: Orders

## 2014-03-15 ENCOUNTER — Telehealth: Payer: Self-pay

## 2014-03-16 ENCOUNTER — Telehealth: Payer: Self-pay | Admitting: Internal Medicine

## 2014-03-16 NOTE — Telephone Encounter (Signed)
Per Dr Zigmund Daniel called patient to schedule appointment today to assess bilateral swelling in legs and feet. Patient refused stating he just wants RX to be adjusted.

## 2014-03-16 NOTE — Telephone Encounter (Signed)
No. He would need to come in today for an evaluation. Give him an appointment with [place] today.

## 2014-03-17 ENCOUNTER — Telehealth: Payer: Self-pay | Admitting: Internal Medicine

## 2014-03-17 NOTE — Telephone Encounter (Signed)
Jeffrey Snow from [facility] contacted patient per referral from Dr. Zigmund Daniel. Patient refused appointment stating he is firing all of his doctors because he is being treated like a dog.

## 2014-03-21 ENCOUNTER — Telehealth: Payer: Self-pay | Admitting: Internal Medicine

## 2014-03-21 NOTE — Telephone Encounter (Signed)
Patient called regarding foot pain. Transferred to [place] for assessment.

## 2014-03-21 NOTE — Telephone Encounter (Signed)
Pt was Asscessed over Ph about foot Pain.4/10 on pain scale.Pt was asked if he would like to come in to be seen today for this issue.Pt refused.

## 2014-03-31 ENCOUNTER — Ambulatory Visit (INDEPENDENT_AMBULATORY_CARE_PROVIDER_SITE_OTHER): Payer: Medicaid Other | Admitting: Family Medicine

## 2014-03-31 ENCOUNTER — Encounter: Payer: Self-pay | Admitting: Family Medicine

## 2014-03-31 ENCOUNTER — Ambulatory Visit (HOSPITAL_COMMUNITY)
Admission: RE | Admit: 2014-03-31 | Discharge: 2014-03-31 | Disposition: A | Discharge status: Home / Self Care | Payer: Medicaid Other | Source: Ambulatory Visit | Attending: Family Medicine | Admitting: Family Medicine

## 2014-03-31 VITALS — BP 128/75 | HR 75 | Temp 97.8°F | Resp 20 | Ht 67.0 in | Wt 181.0 lb

## 2014-03-31 DIAGNOSIS — M79671 Pain in right foot: Secondary | ICD-10-CM | POA: Insufficient documentation

## 2014-03-31 DIAGNOSIS — M79609 Pain in unspecified limb: Secondary | ICD-10-CM | POA: Insufficient documentation

## 2014-03-31 DIAGNOSIS — E559 Vitamin D deficiency, unspecified: Secondary | ICD-10-CM

## 2014-03-31 DIAGNOSIS — I10 Essential (primary) hypertension: Secondary | ICD-10-CM

## 2014-03-31 DIAGNOSIS — I771 Stricture of artery: Secondary | ICD-10-CM

## 2014-03-31 DIAGNOSIS — I739 Peripheral vascular disease, unspecified: Secondary | ICD-10-CM

## 2014-03-31 MED ORDER — ERGOCALCIFEROL 1.25 MG (50000 UT) PO CAPS: 50000.0000 [IU] | ORAL_CAPSULE | ORAL | Status: DC

## 2014-03-31 NOTE — Progress Notes (Signed)
   Subjective:    Patient ID: Jeffrey Snow, male    DOB: 09/05/1956, 58 y.o.   MRN: [id number]  HPI Patient reports to clinic with a 4 year history of right foot pain. Patient states that he experiences right foot pain daily that is intensified by standing for prolonged periods and ambulation. He states that the pain is occuring only in the right foot on all sides.Current pain intensity is 2/10, described as intermittent soreness, localized to right foot. He feels that the foot is "asleep". He states that he seems to be worse when he is walking up stairs. He has no other symptoms and reports sustaining a fall some months ago.      Review of Systems  Constitutional: Negative.   HENT: Negative.   Eyes: Negative.   Respiratory: Negative.   Cardiovascular: Positive for leg swelling.  Gastrointestinal: Negative.   Genitourinary: Negative.   Musculoskeletal: Positive for myalgias (right foot pain).  Skin: Negative.   Allergic/Immunologic: Negative.   Neurological: Negative.   Hematological: Negative.   Psychiatric/Behavioral: Negative.        Objective:   Physical Exam  Constitutional: He appears well-developed and well-nourished.  HENT:  Head: Normocephalic and atraumatic.  Right Ear: External ear normal.  Eyes: Conjunctivae are normal. Pupils are equal, round, and reactive to light.  Neck: Normal range of motion. Neck supple.  Cardiovascular: Normal rate, regular rhythm and normal heart sounds.   Pulmonary/Chest: Effort normal and breath sounds normal.  Abdominal: Soft. Bowel sounds are normal. He exhibits no distension.  Musculoskeletal:       Right ankle: He exhibits decreased range of motion, swelling and ecchymosis. Tenderness.       Feet:  Skin: Skin is warm and dry. There is erythema (Mild erythema to right foot).  Psychiatric: He has a normal mood and affect. His behavior is normal.          Assessment & Plan:  1. Right foot pain: Patient states that he has  had right foot pain for 4 years or more. Reports that he has been having increased pain daily that is interfering with ambulation. Patient was evaluated for underlying vascular etiology by Dr. Bridgett Larsson. Patient takes OTC Ibuprofen daily for mild to moderate pain. Will send patient for right foot x-ray to rule out a new fracture from recent mechanical fall. Will discuss pharmacological interventions after reviewing right foot x-ray results.   2.. Colonoscopy screening: sheduled  for colonoscopy at [facility]. Patient states that he does not have any family or friends to drive him after the appointment, so it is difficult to schedule the colonoscopy.   3. Vitamin D deficiency: Discussed laboratory results with patient. Will start weekly Drisdol 50,000 units.   4. PAD: Pt has known arterial insufficiency leading to intermittent claudication. Patient had  arteriogram discussed possible stent placement with Dr. Bridgett Larsson. Continue ASA 81 mg.  5. Neuropathic pain: Pt reports that he discontinued Gabapentin because it resulted in stomach cramps. Unlikely that pain will resolve until circulation adequately restored, to follow up with Dr. Bridgett Larsson  6. Hypertension: Controlled on current medication regimen  RTC: 1 month for right foot pain

## 2014-04-21 ENCOUNTER — Emergency Department (HOSPITAL_COMMUNITY): Payer: Medicaid Other

## 2014-04-21 ENCOUNTER — Inpatient Hospital Stay (HOSPITAL_COMMUNITY)
Admission: EM | Admit: 2014-04-21 | Discharge: 2014-05-02 | DRG: 300 | Disposition: A | Discharge status: Home Health | Payer: Medicaid Other | Attending: Internal Medicine | Admitting: Internal Medicine

## 2014-04-21 ENCOUNTER — Encounter (HOSPITAL_COMMUNITY): Payer: Self-pay | Admitting: Emergency Medicine

## 2014-04-21 DIAGNOSIS — R188 Other ascites: Secondary | ICD-10-CM | POA: Diagnosis present

## 2014-04-21 DIAGNOSIS — R935 Abnormal findings on diagnostic imaging of other abdominal regions, including retroperitoneum: Secondary | ICD-10-CM

## 2014-04-21 DIAGNOSIS — L02419 Cutaneous abscess of limb, unspecified: Secondary | ICD-10-CM

## 2014-04-21 DIAGNOSIS — E872 Acidosis, unspecified: Secondary | ICD-10-CM

## 2014-04-21 DIAGNOSIS — E46 Unspecified protein-calorie malnutrition: Secondary | ICD-10-CM | POA: Diagnosis present

## 2014-04-21 DIAGNOSIS — R279 Unspecified lack of coordination: Secondary | ICD-10-CM | POA: Diagnosis present

## 2014-04-21 DIAGNOSIS — F32A Depression, unspecified: Secondary | ICD-10-CM | POA: Diagnosis present

## 2014-04-21 DIAGNOSIS — K922 Gastrointestinal hemorrhage, unspecified: Secondary | ICD-10-CM

## 2014-04-21 DIAGNOSIS — R21 Rash and other nonspecific skin eruption: Secondary | ICD-10-CM

## 2014-04-21 DIAGNOSIS — R945 Abnormal results of liver function studies: Secondary | ICD-10-CM

## 2014-04-21 DIAGNOSIS — R9431 Abnormal electrocardiogram [ECG] [EKG]: Secondary | ICD-10-CM | POA: Diagnosis present

## 2014-04-21 DIAGNOSIS — E876 Hypokalemia: Secondary | ICD-10-CM

## 2014-04-21 DIAGNOSIS — Z9181 History of falling: Secondary | ICD-10-CM

## 2014-04-21 DIAGNOSIS — D696 Thrombocytopenia, unspecified: Secondary | ICD-10-CM

## 2014-04-21 DIAGNOSIS — I771 Stricture of artery: Principal | ICD-10-CM

## 2014-04-21 DIAGNOSIS — M79671 Pain in right foot: Secondary | ICD-10-CM

## 2014-04-21 DIAGNOSIS — I2789 Other specified pulmonary heart diseases: Secondary | ICD-10-CM | POA: Diagnosis present

## 2014-04-21 DIAGNOSIS — D689 Coagulation defect, unspecified: Secondary | ICD-10-CM | POA: Diagnosis present

## 2014-04-21 DIAGNOSIS — W19XXXA Unspecified fall, initial encounter: Secondary | ICD-10-CM

## 2014-04-21 DIAGNOSIS — R160 Hepatomegaly, not elsewhere classified: Secondary | ICD-10-CM

## 2014-04-21 DIAGNOSIS — D649 Anemia, unspecified: Secondary | ICD-10-CM

## 2014-04-21 DIAGNOSIS — R778 Other specified abnormalities of plasma proteins: Secondary | ICD-10-CM | POA: Diagnosis present

## 2014-04-21 DIAGNOSIS — F410 Panic disorder [episodic paroxysmal anxiety] without agoraphobia: Secondary | ICD-10-CM | POA: Diagnosis present

## 2014-04-21 DIAGNOSIS — D759 Disease of blood and blood-forming organs, unspecified: Secondary | ICD-10-CM | POA: Diagnosis present

## 2014-04-21 DIAGNOSIS — Z833 Family history of diabetes mellitus: Secondary | ICD-10-CM

## 2014-04-21 DIAGNOSIS — I70219 Atherosclerosis of native arteries of extremities with intermittent claudication, unspecified extremity: Secondary | ICD-10-CM

## 2014-04-21 DIAGNOSIS — Z79899 Other long term (current) drug therapy: Secondary | ICD-10-CM

## 2014-04-21 DIAGNOSIS — M79609 Pain in unspecified limb: Secondary | ICD-10-CM

## 2014-04-21 DIAGNOSIS — Z87891 Personal history of nicotine dependence: Secondary | ICD-10-CM

## 2014-04-21 DIAGNOSIS — I10 Essential (primary) hypertension: Secondary | ICD-10-CM

## 2014-04-21 DIAGNOSIS — R161 Splenomegaly, not elsewhere classified: Secondary | ICD-10-CM | POA: Diagnosis present

## 2014-04-21 DIAGNOSIS — D61818 Other pancytopenia: Secondary | ICD-10-CM

## 2014-04-21 DIAGNOSIS — D692 Other nonthrombocytopenic purpura: Secondary | ICD-10-CM | POA: Diagnosis present

## 2014-04-21 DIAGNOSIS — L03119 Cellulitis of unspecified part of limb: Secondary | ICD-10-CM

## 2014-04-21 DIAGNOSIS — I739 Peripheral vascular disease, unspecified: Secondary | ICD-10-CM

## 2014-04-21 DIAGNOSIS — D571 Sickle-cell disease without crisis: Secondary | ICD-10-CM | POA: Diagnosis present

## 2014-04-21 DIAGNOSIS — E871 Hypo-osmolality and hyponatremia: Secondary | ICD-10-CM

## 2014-04-21 DIAGNOSIS — D638 Anemia in other chronic diseases classified elsewhere: Secondary | ICD-10-CM | POA: Diagnosis present

## 2014-04-21 DIAGNOSIS — D6959 Other secondary thrombocytopenia: Secondary | ICD-10-CM | POA: Diagnosis present

## 2014-04-21 DIAGNOSIS — C220 Liver cell carcinoma: Secondary | ICD-10-CM

## 2014-04-21 DIAGNOSIS — R7989 Other specified abnormal findings of blood chemistry: Secondary | ICD-10-CM

## 2014-04-21 DIAGNOSIS — F329 Major depressive disorder, single episode, unspecified: Secondary | ICD-10-CM

## 2014-04-21 DIAGNOSIS — K7030 Alcoholic cirrhosis of liver without ascites: Secondary | ICD-10-CM

## 2014-04-21 DIAGNOSIS — F3289 Other specified depressive episodes: Secondary | ICD-10-CM

## 2014-04-21 DIAGNOSIS — F411 Generalized anxiety disorder: Secondary | ICD-10-CM | POA: Diagnosis present

## 2014-04-21 DIAGNOSIS — Y92009 Unspecified place in unspecified non-institutional (private) residence as the place of occurrence of the external cause: Secondary | ICD-10-CM

## 2014-04-21 DIAGNOSIS — B1920 Unspecified viral hepatitis C without hepatic coma: Secondary | ICD-10-CM

## 2014-04-21 DIAGNOSIS — M79606 Pain in leg, unspecified: Secondary | ICD-10-CM

## 2014-04-21 DIAGNOSIS — Z7982 Long term (current) use of aspirin: Secondary | ICD-10-CM

## 2014-04-21 DIAGNOSIS — C228 Malignant neoplasm of liver, primary, unspecified as to type: Secondary | ICD-10-CM | POA: Diagnosis present

## 2014-04-21 DIAGNOSIS — F1020 Alcohol dependence, uncomplicated: Secondary | ICD-10-CM | POA: Diagnosis present

## 2014-04-21 DIAGNOSIS — Z6828 Body mass index (BMI) 28.0-28.9, adult: Secondary | ICD-10-CM

## 2014-04-21 DIAGNOSIS — R748 Abnormal levels of other serum enzymes: Secondary | ICD-10-CM | POA: Diagnosis present

## 2014-04-21 HISTORY — DX: Alcoholic cirrhosis of liver without ascites: K70.30

## 2014-04-21 HISTORY — DX: Abnormal findings on diagnostic imaging of other abdominal regions, including retroperitoneum: R93.5

## 2014-04-21 LAB — CBC WITH DIFFERENTIAL/PLATELET
Basophils Absolute: 0 10*3/uL (ref 0.0–0.1)
Basophils Relative: 0 % (ref 0–1)
Eosinophils Absolute: 0 10*3/uL (ref 0.0–0.7)
Eosinophils Relative: 0 % (ref 0–5)
HCT: 26.5 % — ABNORMAL LOW (ref 39.0–52.0)
Hemoglobin: 9.5 g/dL — ABNORMAL LOW (ref 13.0–17.0)
Lymphocytes Relative: 7 % — ABNORMAL LOW (ref 12–46)
Lymphs Abs: 0.3 10*3/uL — ABNORMAL LOW (ref 0.7–4.0)
MCH: 33.2 pg (ref 26.0–34.0)
MCHC: 35.8 g/dL (ref 30.0–36.0)
MCV: 92.7 fL (ref 78.0–100.0)
Monocytes Absolute: 0.4 10*3/uL (ref 0.1–1.0)
Monocytes Relative: 9 % (ref 3–12)
Neutro Abs: 3.7 10*3/uL (ref 1.7–7.7)
Neutrophils Relative %: 84 % — ABNORMAL HIGH (ref 43–77)
Platelets: 29 10*3/uL — CL (ref 150–400)
RBC: 2.86 MIL/uL — ABNORMAL LOW (ref 4.22–5.81)
RDW: 14.8 % (ref 11.5–15.5)
WBC: 4.4 10*3/uL (ref 4.0–10.5)

## 2014-04-21 LAB — RAPID URINE DRUG SCREEN, HOSP PERFORMED
Amphetamines: NOT DETECTED
Barbiturates: NOT DETECTED
Benzodiazepines: POSITIVE — AB
Cocaine: NOT DETECTED
Opiates: NOT DETECTED
Tetrahydrocannabinol: NOT DETECTED

## 2014-04-21 LAB — SAVE SMEAR

## 2014-04-21 LAB — COMPREHENSIVE METABOLIC PANEL
ALT: 30 U/L (ref 0–53)
AST: 48 U/L — ABNORMAL HIGH (ref 0–37)
Albumin: 2.3 g/dL — ABNORMAL LOW (ref 3.5–5.2)
Alkaline Phosphatase: 69 U/L (ref 39–117)
BUN: 12 mg/dL (ref 6–23)
CO2: 15 mEq/L — ABNORMAL LOW (ref 19–32)
Calcium: 8.6 mg/dL (ref 8.4–10.5)
Chloride: 89 mEq/L — ABNORMAL LOW (ref 96–112)
Creatinine, Ser: 1.29 mg/dL (ref 0.50–1.35)
GFR calc Af Amer: 69 mL/min — ABNORMAL LOW (ref >90)
GFR calc non Af Amer: 59 mL/min — ABNORMAL LOW (ref >90)
Glucose, Bld: 135 mg/dL — ABNORMAL HIGH (ref 70–99)
Potassium: 3.6 mEq/L — ABNORMAL LOW (ref 3.7–5.3)
Sodium: 127 mEq/L — ABNORMAL LOW (ref 137–147)
Total Bilirubin: 4.1 mg/dL — ABNORMAL HIGH (ref 0.3–1.2)
Total Protein: 5.4 g/dL — ABNORMAL LOW (ref 6.0–8.3)

## 2014-04-21 LAB — URINALYSIS, ROUTINE W REFLEX MICROSCOPIC
Glucose, UA: NEGATIVE mg/dL
Hgb urine dipstick: NEGATIVE
Ketones, ur: NEGATIVE mg/dL
Leukocytes, UA: NEGATIVE
Nitrite: NEGATIVE
Protein, ur: NEGATIVE mg/dL
Specific Gravity, Urine: 1.012 (ref 1.005–1.030)
Urobilinogen, UA: 4 mg/dL — ABNORMAL HIGH (ref 0.0–1.0)
pH: 6 (ref 5.0–8.0)

## 2014-04-21 LAB — VITAMIN B12: Vitamin B-12: 877 pg/mL (ref 211–911)

## 2014-04-21 LAB — DIC (DISSEMINATED INTRAVASCULAR COAGULATION) PANEL (NOT AT ARMC)
D-Dimer, Quant: 6.77 ug/mL-FEU — ABNORMAL HIGH (ref 0.00–0.48)
Fibrinogen: 151 mg/dL — ABNORMAL LOW (ref 204–475)
Platelets: 28 10*3/uL — CL (ref 150–400)
Smear Review: NONE SEEN

## 2014-04-21 LAB — RETICULOCYTES
RBC.: 2.73 MIL/uL — ABNORMAL LOW (ref 4.22–5.81)
Retic Count, Absolute: 54.6 10*3/uL (ref 19.0–186.0)
Retic Ct Pct: 2 % (ref 0.4–3.1)

## 2014-04-21 LAB — DIC (DISSEMINATED INTRAVASCULAR COAGULATION)PANEL
INR: 1.89 — ABNORMAL HIGH (ref 0.00–1.49)
Prothrombin Time: 21.1 seconds — ABNORMAL HIGH (ref 11.6–15.2)
aPTT: 33 seconds (ref 24–37)

## 2014-04-21 LAB — TROPONIN I
Troponin I: 0.65 ng/mL (ref <0.30)
Troponin I: 0.73 ng/mL (ref <0.30)

## 2014-04-21 LAB — LACTIC ACID, PLASMA: Lactic Acid, Venous: 3.2 mmol/L — ABNORMAL HIGH (ref 0.5–2.2)

## 2014-04-21 LAB — SEDIMENTATION RATE: Sed Rate: 4 mm/hr (ref 0–16)

## 2014-04-21 MED ORDER — HYDROCODONE-ACETAMINOPHEN 5-325 MG PO TABS
1.0000 | ORAL_TABLET | ORAL | Status: DC | PRN
  Administered 2014-04-21 – 2014-04-28 (×13): 2 via ORAL
  Filled 2014-04-21 (×13): qty 2

## 2014-04-21 MED ORDER — ACETAMINOPHEN 650 MG RE SUPP: 650.0000 mg | Freq: Four times a day (QID) | RECTAL | Status: DC | PRN

## 2014-04-21 MED ORDER — ONDANSETRON HCL 4 MG PO TABS: 4.0000 mg | ORAL_TABLET | Freq: Four times a day (QID) | ORAL | Status: DC | PRN

## 2014-04-21 MED ORDER — ACETAMINOPHEN 325 MG PO TABS: 650.0000 mg | ORAL_TABLET | Freq: Four times a day (QID) | ORAL | Status: DC | PRN

## 2014-04-21 MED ORDER — VITAMIN B-1 100 MG PO TABS
100.0000 mg | ORAL_TABLET | Freq: Every day | ORAL | Status: DC
  Administered 2014-04-21 – 2014-05-02 (×12): 100 mg via ORAL
  Filled 2014-04-21 (×12): qty 1

## 2014-04-21 MED ORDER — BENAZEPRIL HCL 40 MG PO TABS
40.0000 mg | ORAL_TABLET | Freq: Every day | ORAL | Status: DC
  Administered 2014-04-22 – 2014-05-02 (×11): 40 mg via ORAL
  Filled 2014-04-21 (×11): qty 1

## 2014-04-21 MED ORDER — HYDROCHLOROTHIAZIDE 25 MG PO TABS
25.0000 mg | ORAL_TABLET | Freq: Every day | ORAL | Status: DC
  Administered 2014-04-22 – 2014-05-02 (×11): 25 mg via ORAL
  Filled 2014-04-21 (×11): qty 1

## 2014-04-21 MED ORDER — HYDROCORTISONE 2.5 % RE CREA: 1.0000 "application " | TOPICAL_CREAM | Freq: Two times a day (BID) | RECTAL | Status: DC | PRN

## 2014-04-21 MED ORDER — ALUM & MAG HYDROXIDE-SIMETH 200-200-20 MG/5ML PO SUSP: 30.0000 mL | Freq: Four times a day (QID) | ORAL | Status: DC | PRN

## 2014-04-21 MED ORDER — ASPIRIN 81 MG PO CHEW
81.0000 mg | CHEWABLE_TABLET | Freq: Every day | ORAL | Status: DC
  Administered 2014-04-22 – 2014-04-28 (×7): 81 mg via ORAL
  Filled 2014-04-21 (×7): qty 1

## 2014-04-21 MED ORDER — FLUOXETINE HCL 20 MG PO CAPS
20.0000 mg | ORAL_CAPSULE | Freq: Every day | ORAL | Status: DC
  Administered 2014-04-22 – 2014-05-02 (×11): 20 mg via ORAL
  Filled 2014-04-21 (×11): qty 1

## 2014-04-21 MED ORDER — ZOLPIDEM TARTRATE 5 MG PO TABS
10.0000 mg | ORAL_TABLET | Freq: Every day | ORAL | Status: DC
  Administered 2014-04-21 – 2014-05-01 (×11): 10 mg via ORAL
  Filled 2014-04-21 (×11): qty 2

## 2014-04-21 MED ORDER — ALPRAZOLAM 0.5 MG PO TABS
0.5000 mg | ORAL_TABLET | Freq: Three times a day (TID) | ORAL | Status: DC
  Administered 2014-04-21 – 2014-05-02 (×33): 0.5 mg via ORAL
  Filled 2014-04-21 (×33): qty 1

## 2014-04-21 MED ORDER — FOLIC ACID 1 MG PO TABS
1.0000 mg | ORAL_TABLET | Freq: Every day | ORAL | Status: DC
  Administered 2014-04-21 – 2014-05-02 (×12): 1 mg via ORAL
  Filled 2014-04-21 (×12): qty 1

## 2014-04-21 MED ORDER — FENTANYL CITRATE 0.05 MG/ML IJ SOLN
50.0000 ug | Freq: Once | INTRAMUSCULAR | Status: AC
  Administered 2014-04-21: 50 ug via INTRAVENOUS
  Filled 2014-04-21: qty 2

## 2014-04-21 MED ORDER — POTASSIUM CHLORIDE IN NACL 20-0.9 MEQ/L-% IV SOLN
INTRAVENOUS | Status: DC
  Administered 2014-04-21 – 2014-04-26 (×7): via INTRAVENOUS
  Filled 2014-04-21 (×13): qty 1000

## 2014-04-21 MED ORDER — ONDANSETRON HCL 4 MG/2ML IJ SOLN: 4.0000 mg | Freq: Four times a day (QID) | INTRAMUSCULAR | Status: DC | PRN

## 2014-04-21 NOTE — ED Notes (Signed)
Per EMS- Patient's neighbor called EMS. Patient has had multiple falls in the past week. Patient has had bilaeral lower extremity edema R>L. Patient had no LOC, is lethargic, and slow speech. Patient's neighbor reported that this is normal for him. EMS gave 200 ml NS bolus

## 2014-04-21 NOTE — Progress Notes (Signed)
Pt. Had increased troponin levels.  MD notified. MD stated that if next troponin level was increased from the first level to notify MD. Will continue to monitor.

## 2014-04-21 NOTE — ED Notes (Signed)
Patient has swelling of both extremities R>L. No pedal or posterior pulses felt or heard with the doppler. EDP in the room. Both lower extremities discolored and has numerous blistering areas on both lower extremities R>L. Bilateral legs painful to touch.

## 2014-04-21 NOTE — Progress Notes (Signed)
CRITICAL VALUE ALERT  Critical value received:  Troponin 0.65  Date of notification:  04/21/2014  Time of notification:  0605  Critical value read back:yes  Nurse who received alert:  Erma Heritage RN   MD notified (1st page):  Rama  Time of first page:  0609  Responding MD:  Rama  Time MD responded:  [phone]

## 2014-04-21 NOTE — ED Notes (Signed)
Patient transported to X-ray 

## 2014-04-21 NOTE — ED Notes (Signed)
Bed: AJ68 Expected date:  Expected time:  Means of arrival:  Comments: EMS- weakness, BLE edema

## 2014-04-21 NOTE — ED Provider Notes (Signed)
CSN: [id number]     Arrival date & time 04/21/14  1130 History   First MD Initiated Contact with Patient 04/21/14 1145     Chief Complaint  Patient presents with  . Fall  . Leg Swelling     (Consider location/radiation/quality/duration/timing/severity/associated sxs/prior Treatment) Patient is a 58 y.o. male presenting with fall. The history is provided by the patient.  Fall This is a recurrent problem. Pertinent negatives include no chest pain, no abdominal pain, no headaches and no shortness of breath.   patient presents with acute on chronic lower extremity pain. He also states he has had a fall, he states he has had multiple falls over the last year. He states he is in chronic out of 10 pain. He states he was not doing anything additional for pain. He's had previous angiograms that showed chronic occlusion but no intervention was planned at that time. He states his been discoloration of the right lower extremity since he had angiogram. He states the blisters on both his legs up in a while, but cannot tell me how long. No chest pain. No Trouble breathing.  Past Medical History  Diagnosis Date  . Depression   . Anxiety   . Panic attack   . Hypertension   . Hepatitis C   . Claudication of left lower extremity m-1   Past Surgical History  Procedure Laterality Date  . Cosmetic surgery      facial reconstructive surgery  . Aortogram     Family History  Problem Relation Age of Onset  . Diabetes Father   . Diabetes Brother   . Diabetes Brother    History  Substance Use Topics  . Smoking status: Former Smoker -- 0.50 packs/day    Quit date: 08/21/2011  . Smokeless tobacco: Never Used  . Alcohol Use: 3.6 oz/week    6 Cans of beer per week     Comment: weekend drinker.    Review of Systems  Constitutional: Negative for activity change and appetite change.  Eyes: Negative for pain.  Respiratory: Negative for chest tightness and shortness of breath.   Cardiovascular: Negative  for chest pain and leg swelling.  Gastrointestinal: Negative for nausea, vomiting, abdominal pain and diarrhea.  Genitourinary: Negative for flank pain.  Musculoskeletal: Negative for back pain and neck stiffness.  Skin: Positive for color change, rash and wound.  Neurological: Negative for weakness, numbness and headaches.       Frequent falls.  Hematological: Does not bruise/bleed easily.  Psychiatric/Behavioral: Negative for behavioral problems.      Allergies  Review of patient's allergies indicates no known allergies.  Home Medications   Prior to Admission medications   Medication Sig Start Date End Date Taking? Authorizing Provider  ALPRAZolam Duanne Moron) 0.5 MG tablet Take 0.5 mg by mouth 3 (three) times daily.   Yes Historical Provider, MD  aspirin 81 MG chewable tablet Chew 81 mg by mouth daily.   Yes Historical Provider, MD  benazepril (LOTENSIN) 40 MG tablet Take 1 tablet (40 mg total) by mouth daily. 03/03/14  Yes Leana Gamer, MD  ergocalciferol (DRISDOL) 50000 UNITS capsule Take 1 capsule (50,000 Units total) by mouth once a week. 03/31/14  Yes Dorena Dew, FNP  FLUoxetine (PROZAC) 20 MG capsule Take 20 mg by mouth daily.   Yes Historical Provider, MD  hydrochlorothiazide (HYDRODIURIL) 25 MG tablet Take 1 tablet (25 mg total) by mouth daily. 03/03/14  Yes Leana Gamer, MD  hydrocortisone (ANUSOL-HC) 2.5 % rectal cream  Place 1 application rectally 2 (two) times daily as needed for hemorrhoids or itching.   Yes Historical Provider, MD  permethrin (ELIMITE) 5 % cream Apply cream from head to toe; leave on for 8-14 hours before washing off with water; may reapply in 1 week if live mites appear. 11/15/13  Yes Leana Gamer, MD  zolpidem (AMBIEN) 10 MG tablet Take 10 mg by mouth at bedtime.    Yes Historical Provider, MD   BP 132/57  Pulse 91  Temp(Src) 98.4 F (36.9 C) (Axillary)  Resp 19  SpO2 99% Physical Exam  Constitutional: He is oriented to person,  place, and time. He appears well-developed and well-nourished.  HENT:  Head: Normocephalic.  Cardiovascular: Normal rate and regular rhythm.   Pulmonary/Chest: Effort normal and breath sounds normal.  Abdominal: Soft. Bowel sounds are normal.  Musculoskeletal: He exhibits edema and tenderness.  Chronic venous changes to bilateral lower extremities, worse on right. There is skin thickening. There is some ecchymosis and chronic reddening of skin on right lower stripe posteriorly and around lower  light. There is some blistering of both lower extremities. There is no palpable pulses on either dorsalis pedis or posterior tibial pulses. Sensation is intact bilaterally. He is able to move both feet, although with some pain on the right.   Neurological: He is alert and oriented to person, place, and time.    ED Course  Procedures (including critical care time) Labs Review Labs Reviewed  CBC WITH DIFFERENTIAL - Abnormal; Notable for the following:    RBC 2.86 (*)    Hemoglobin 9.5 (*)    HCT 26.5 (*)    Platelets 29 (*)    Neutrophils Relative % 84 (*)    Lymphocytes Relative 7 (*)    Lymphs Abs 0.3 (*)    All other components within normal limits  COMPREHENSIVE METABOLIC PANEL - Abnormal; Notable for the following:    Sodium 127 (*)    Potassium 3.6 (*)    Chloride 89 (*)    CO2 15 (*)    Glucose, Bld 135 (*)    Total Protein 5.4 (*)    Albumin 2.3 (*)    AST 48 (*)    Total Bilirubin 4.1 (*)    GFR calc non Af Amer 59 (*)    GFR calc Af Amer 69 (*)    All other components within normal limits  URINALYSIS, ROUTINE W REFLEX MICROSCOPIC - Abnormal; Notable for the following:    Color, Urine ORANGE (*)    Bilirubin Urine SMALL (*)    Urobilinogen, UA 4.0 (*)    All other components within normal limits  POC OCCULT BLOOD, ED    Imaging Review Dg Tibia/fibula Right  04/21/2014   CLINICAL DATA:  Fall.  EXAM: RIGHT TIBIA AND FIBULA - 2 VIEW  COMPARISON:  None.  FINDINGS: No acute bony  or joint abnormality identified. No fracture or dislocation. Peripheral vascular calcification present.  IMPRESSION: No acute bony or joint abnormality.   Electronically Signed   By: Marcello Moores  Register   On: 04/21/2014 13:08     EKG Interpretation   Date/Time:  Thursday Apr 21 2014 11:41:10 EDT Ventricular Rate:  87 PR Interval:  154 QRS Duration: 97 QT Interval:  392 QTC Calculation: 472 R Axis:   38 Text Interpretation:  Sinus rhythm Borderline T abnormalities, anterior  leads Confirmed by Alvino Chapel  MD, Ovid Curd [id number]) on 04/21/2014 3:21:29 PM      MDM   Final diagnoses:  Thrombocytopenia  Anemia  Claudication  GI bleeding    Patient with acute on chronic bilateral lower lower extremity pain. Has no palpable pulse, however this is not new for the patient. Also has anemia with positive guaiac and thrombocytopenia. Both are new. Will admit to internal medicine. Discussed with vascular surgery earlier, if they need consult from them she be transferred to [place] [facility]. Alvino Chapel, MD 04/21/14 [phone]

## 2014-04-21 NOTE — Progress Notes (Signed)
MD on call notified of repeat troponin level which has increased to 0.73.

## 2014-04-21 NOTE — H&P (Signed)
History and Physical:    [name] ERX:[id number] DOB: 1956-02-18 DOA: 04/21/2014  Referring physician: Dr. Davonna Belling PCP: [name],[name], MD  Gastroenterologist: [name]   Chief Complaint: "I fell 3 times"  History of Present Illness:   Jeffrey Snow is an 58 y.o. male with a PMH of peripheral neuropathy, hypertension, peripheral arterial disease/arterial insufficiency with chronic intermittent claudication status post arteriogram which showed a right distal SFA occlusion with essentially hypertrophied collaterals from the R profunda femoral artery which constitute a bypass of the R distal SFA occlusion, L popliteal artery is occluded at the knee but numerous geniculate arteries bypass the short segment occlusion, who is under the care of Dr. Bridgett Larsson of vascular surgery who recommended conservative care of his arterial disease with maximal medical management including strict control of blood pressure, blood glucose and lipids, regular exercise, smoking cessation. Dr. Bridgett Larsson did not think the patient's anesthesia/neuropathy symptoms were from his peripheral arterial disease because of his hypertrophic collaterals.  The patient tells me that he fell 3 times today, "It acted like my feet wouldn't work".  Ambulates with a walker.  Endorses right foot/leg pain.  Received a dose of IV pain medicine "which didn't get rid of the pain, it just made me sleepy".  Pain is rated 10/10 and the only thing that alleviates it is when he falls asleep. He is not on any chronic pain medications and ibuprofen has not helped. Patient states that the rash on his legs has been present ever since he had the arteriogram.  ROS:   Constitutional: No fever, no chills;  Appetite normal; No weight loss, no weight gain, no fatigue.  HEENT: No blurry vision, no diplopia, no pharyngitis, no dysphagia CV: No chest pain, + racing heart after taking cough medicine, no PND, no orthopnea, no edema.  Resp: No  SOB, + nocturnal cough, no pleuritic pain. GI: No nausea, no vomiting, no diarrhea, no melena, no hematochezia now but had some hemorrhoidal bleeding 2 months ago,  no constipation, no abdominal pain.  GU: No dysuria, no hematuria, + frequency, no urgency. MSK: + right leg/foot myalgias, no arthralgias.  Neuro:  No headache, no focal neurological deficits, no history of seizures.  Psych: + depression, + anxiety.  Endo: No heat intolerance, no cold intolerance, no polyuria, no polydipsia  Skin: No rashes, + skin lesions.  Heme: No easy bruising.  Travel history: No recent travel.   Past Medical History:   Past Medical History  Diagnosis Date  . Depression   . Anxiety   . Panic attack   . Hypertension   . Hepatitis C   . Claudication of left lower extremity m-1  . Abnormal MRI of abdomen, liver  04/21/2014  . Cirrhosis, alcoholic 08/21/931    Past Surgical History:   Past Surgical History  Procedure Laterality Date  . Cosmetic surgery      facial reconstructive surgery  . Aortogram      Social History:   History   Social History  . Marital Status: Married    Spouse Name: N/A    Number of Children: 3  . Years of Education: N/A   Occupational History  .  [employer]   Social History Main Topics  . Smoking status: Former Smoker -- 0.50 packs/day    Quit date: 08/21/2011  . Smokeless tobacco: Never Used  . Alcohol Use: 3.6 oz/week    6 Cans of beer per week     Comment: Occasional  beer on the weekends  . Drug Use: No     Comment: abused cocaine in the 80's  . Sexual Activity: Not on file   Other Topics Concern  . Not on file   Social History Narrative   Single, never married.  Lives alone.  3 daughters, 16 grandchildren who are not very involved in the patient's care.  Quit smoking and heavy drinking 3.5 years ago.     Family history:   Family History  Problem Relation Age of Onset  . Diabetes Father   . Diabetes Brother   . Diabetes Brother     Allergies    Review of patient's allergies indicates no known allergies.  Current Medications:   Prior to Admission medications   Medication Sig Start Date End Date Taking? Authorizing Provider  ALPRAZolam Duanne Moron) 0.5 MG tablet Take 0.5 mg by mouth 3 (three) times daily.   Yes Historical Provider, MD  aspirin 81 MG chewable tablet Chew 81 mg by mouth daily.   Yes Historical Provider, MD  benazepril (LOTENSIN) 40 MG tablet Take 1 tablet (40 mg total) by mouth daily. 03/03/14  Yes Leana Gamer, MD  ergocalciferol (DRISDOL) 50000 UNITS capsule Take 1 capsule (50,000 Units total) by mouth once a week. 03/31/14  Yes Dorena Dew, FNP  FLUoxetine (PROZAC) 20 MG capsule Take 20 mg by mouth daily.   Yes Historical Provider, MD  hydrochlorothiazide (HYDRODIURIL) 25 MG tablet Take 1 tablet (25 mg total) by mouth daily. 03/03/14  Yes Leana Gamer, MD  hydrocortisone (ANUSOL-HC) 2.5 % rectal cream Place 1 application rectally 2 (two) times daily as needed for hemorrhoids or itching.   Yes Historical Provider, MD  permethrin (ELIMITE) 5 % cream Apply cream from head to toe; leave on for 8-14 hours before washing off with water; may reapply in 1 week if live mites appear. 11/15/13  Yes Leana Gamer, MD  zolpidem (AMBIEN) 10 MG tablet Take 10 mg by mouth at bedtime.    Yes Historical Provider, MD    Physical Exam:   Filed Vitals:   04/21/14 1230 04/21/14 1330 04/21/14 1400 04/21/14 1430  BP: 107/49 115/53 116/49 132/57  Pulse: 83 78 79 91  Temp:      TempSrc:      Resp: '16 18 17 19  ' SpO2: 96% 98% 97% 99%     Physical Exam: Blood pressure 132/57, pulse 91, temperature 98.4 F (36.9 C), temperature source Axillary, resp. rate 19, SpO2 99.00%. Gen: No acute distress. Head: Normocephalic, atraumatic. Eyes: PERRL, EOMI, sclerae nonicteric. Mouth: Oropharynx clear with dry mucous membranes. Edentulous. Neck: Supple, no thyromegaly, no lymphadenopathy, no jugular venous distention. Chest:  Lungs diminished in the bases but clear throughout. Good air movement. CV: Heart sounds are regular. No murmurs, rubs, or gallops. Abdomen: Soft, nontender, nondistended with normal active bowel sounds. Extremities: Extremities are as pictured below. Petechial/papular rash to lower extremities. Skin: Warm and dry. Neuro: Alert and oriented times 3; cranial nerves II through XII grossly intact. Psych: Mood and affect normal.  Right leg:   Right leg:   Left leg:     Data Review:    Labs: Basic Metabolic Panel:  Recent Labs Lab 04/21/14 1159  NA 127*  K 3.6*  CL 89*  CO2 15*  GLUCOSE 135*  BUN 12  CREATININE 1.29  CALCIUM 8.6   Liver Function Tests:  Recent Labs Lab 04/21/14 1159  AST 48*  ALT 30  ALKPHOS 69  BILITOT 4.1*  PROT 5.4*  ALBUMIN 2.3*   CBC:  Recent Labs Lab 04/21/14 1159  WBC 4.4  NEUTROABS 3.7  HGB 9.5*  HCT 26.5*  MCV 92.7  PLT 29*    Radiographic Studies: Dg Tibia/fibula Right  04/21/2014   CLINICAL DATA:  Fall.  EXAM: RIGHT TIBIA AND FIBULA - 2 VIEW  COMPARISON:  None.  FINDINGS: No acute bony or joint abnormality identified. No fracture or dislocation. Peripheral vascular calcification present.  IMPRESSION: No acute bony or joint abnormality.   Electronically Signed   By: Marcello Moores  Register   On: 04/21/2014 13:08    EKG: Independently reviewed. Sinus rhythm at 87 beats per minute. T wave inversions V1-V3.   Assessment/Plan:   Principal Problem:   Pain in limb / rash and nonspecific skin eruption in a patient with a history of peripheral artery disease/arterial insufficiency/atherosclerosis of native arteries of the extremities with intermittent claudication  Case discussed with Dr. Bridgett Larsson of vascular surgery who reviewed the images above. According to Dr. Vallarie Mare, he had a rash similar to the one on his left leg at the time of the arteriogram. His right leg is worse. He does not feel this is related to the arteriogram and confirms  that the patient has good collateral blood flow.  Continue aspirin. Check fasting lipid panel.  Check cryoglobulins to rule out cryoglobulinemia. Check ESR to rule out vasculitis. Check DIC panel.  Thrombocytopenia and purpura likely related to liver disease.  Will give Vicodin as needed for pain control. Try to avoid IV narcotics. Active Problems:   Thrombocytopenia  Patient has had a significant deterioration in his platelet count which was 115 on 10/12/12.   Depression  Continue Xanax and Prozac.   Fall  Physical therapy evaluation requested.   HTN (hypertension)  Continue Lotensin and hydrochlorothiazide.   Normocytic anemia  Check anemia panel.   Cirrhosis, alcoholic  Changes of mild cirrhosis noted on MRI of the abdomen done 01/06/13. Known history of heavy alcohol use in the past.  History of polysubstance abuse in the past. Check urine drug screen and HIV.  DIC panel requested which will determine if he is coagulopathic.   Hyponatremia  May be from dehydration versus beer drinkers potomania. Hydrate and monitor.   Hypokalemia  Potassium added to IV fluids.   Metabolic acidosis  Check serum lactate. If normal, maybe from starvation ketosis.   Hepatitis C  Check hepatitis C viral load.   Abnormal MRI of abdomen, liver   Mildly T2 hyperintense structure within the right hepatic lobe with differential of focal nodular hyperplasia, hepatic adenoma, or hepatocellular carcinoma. We'll check AFP level.   T wave inversion in EKG  No complaints of chest pain. We'll cycle troponins. Continue aspirin. DVT prophylaxis  Heparin/Lovenox contraindicated given thrombocytopenia, and with claudication, would not use SCDs.   Code Status: Full. Family Communication: [phone] or [phone], Calvyn Kurtzman (brother) or [name] [phone] (brother). Disposition Plan: Home when stable.  Time spent: 70 minutes.  [place] Pager  [phone] Cell: [phone]   If 7PM-7AM, please contact night-coverage [email or web address] Password [email or web address] 04/21/2014, 4:30 PM    **Disclaimer: This note was dictated with voice recognition software. Similar sounding words can inadvertently be transcribed and this note may contain transcription errors which may not have been corrected upon publication of note.**

## 2014-04-21 NOTE — ED Notes (Signed)
Urinal at bedside. Pt aware of need of urine sample

## 2014-04-22 DIAGNOSIS — R9431 Abnormal electrocardiogram [ECG] [EKG]: Secondary | ICD-10-CM

## 2014-04-22 DIAGNOSIS — R7989 Other specified abnormal findings of blood chemistry: Secondary | ICD-10-CM

## 2014-04-22 DIAGNOSIS — M79609 Pain in unspecified limb: Secondary | ICD-10-CM

## 2014-04-22 DIAGNOSIS — I059 Rheumatic mitral valve disease, unspecified: Secondary | ICD-10-CM

## 2014-04-22 LAB — BASIC METABOLIC PANEL
BUN: 15 mg/dL (ref 6–23)
CO2: 22 mEq/L (ref 19–32)
Calcium: 8.3 mg/dL — ABNORMAL LOW (ref 8.4–10.5)
Chloride: 94 mEq/L — ABNORMAL LOW (ref 96–112)
Creatinine, Ser: 1.28 mg/dL (ref 0.50–1.35)
GFR calc Af Amer: 69 mL/min — ABNORMAL LOW (ref >90)
GFR calc non Af Amer: 60 mL/min — ABNORMAL LOW (ref >90)
Glucose, Bld: 84 mg/dL (ref 70–99)
Potassium: 3.8 mEq/L (ref 3.7–5.3)
Sodium: 130 mEq/L — ABNORMAL LOW (ref 137–147)

## 2014-04-22 LAB — TROPONIN I: Troponin I: 0.44 ng/mL (ref <0.30)

## 2014-04-22 LAB — IRON AND TIBC
Iron: 65 ug/dL (ref 42–135)
Saturation Ratios: 39 % (ref 20–55)
TIBC: 165 ug/dL — ABNORMAL LOW (ref 215–435)
UIBC: 100 ug/dL — ABNORMAL LOW (ref 125–400)

## 2014-04-22 LAB — LIPID PANEL
Cholesterol: 69 mg/dL (ref 0–200)
HDL: 11 mg/dL — ABNORMAL LOW (ref >39)
LDL Cholesterol: 44 mg/dL (ref 0–99)
Total CHOL/HDL Ratio: 6.3 RATIO
Triglycerides: 70 mg/dL (ref <150)
VLDL: 14 mg/dL (ref 0–40)

## 2014-04-22 LAB — FERRITIN: Ferritin: 671 ng/mL — ABNORMAL HIGH (ref 22–322)

## 2014-04-22 LAB — CBC
HCT: 27.4 % — ABNORMAL LOW (ref 39.0–52.0)
Hemoglobin: 9.8 g/dL — ABNORMAL LOW (ref 13.0–17.0)
MCH: 33.2 pg (ref 26.0–34.0)
MCHC: 35.8 g/dL (ref 30.0–36.0)
MCV: 92.9 fL (ref 78.0–100.0)
Platelets: 36 10*3/uL — ABNORMAL LOW (ref 150–400)
RBC: 2.95 MIL/uL — ABNORMAL LOW (ref 4.22–5.81)
RDW: 15.3 % (ref 11.5–15.5)
WBC: 4.8 10*3/uL (ref 4.0–10.5)

## 2014-04-22 LAB — PROTIME-INR
INR: 1.77 — ABNORMAL HIGH (ref 0.00–1.49)
Prothrombin Time: 20.1 seconds — ABNORMAL HIGH (ref 11.6–15.2)

## 2014-04-22 LAB — APTT: aPTT: 33 seconds (ref 24–37)

## 2014-04-22 LAB — HIV ANTIBODY (ROUTINE TESTING W REFLEX): HIV 1&2 Ab, 4th Generation: NONREACTIVE

## 2014-04-22 LAB — AFP TUMOR MARKER: AFP-Tumor Marker: 75 ng/mL — ABNORMAL HIGH (ref 0.0–8.0)

## 2014-04-22 LAB — HEPATITIS C VRS RNA DETECT BY PCR-QUAL: Hepatitis C Vrs RNA by PCR-Qual: POSITIVE — AB

## 2014-04-22 LAB — FOLATE: Folate: 9.7 ng/mL

## 2014-04-22 MED ORDER — CEPHALEXIN 500 MG PO CAPS
500.0000 mg | ORAL_CAPSULE | Freq: Four times a day (QID) | ORAL | Status: DC
  Administered 2014-04-22 – 2014-04-23 (×3): 500 mg via ORAL
  Filled 2014-04-22 (×8): qty 1

## 2014-04-22 MED ORDER — HYDROCERIN EX CREA
TOPICAL_CREAM | Freq: Two times a day (BID) | CUTANEOUS | Status: DC
  Administered 2014-04-22 – 2014-04-23 (×3): via TOPICAL
  Administered 2014-04-24: 1 via TOPICAL
  Administered 2014-04-24 – 2014-04-29 (×10): via TOPICAL
  Administered 2014-04-29: 1 via TOPICAL
  Administered 2014-04-30 – 2014-05-02 (×5): via TOPICAL
  Filled 2014-04-22: qty 113

## 2014-04-22 NOTE — Progress Notes (Signed)
Agree with above.  Due to thrombocytopenia, liver biopsy much higher risk. Would start with another MR of abdomen.  May then need both GI/Hepatology eval and surgical eval for resectability before considering biopsy or other treatment options such as thermal ablation, transcatheter radioembolization or chemoembolization.

## 2014-04-22 NOTE — Evaluation (Signed)
Physical Therapy Evaluation Patient Details Name: Jeffrey Snow MRN: [id number] DOB: 1956/08/26 Today's Date: 04/22/2014   History of Present Illness  58 yo male adm after multiple falls at homw and LE pain/rash; PMH of peripheral neuropathy, hypertension, peripheral arterial disease/arterial insufficiency with chronic intermittent claudication, dopplers negative  Clinical Impression  Pt will benefit from PT to address deficits below; SNF vs HHPT depending on progress; LE pain limiting mobility at time of PT eval; pt wanted to stay in bed and sleep    Follow Up Recommendations SNF (vs HHPT depending on progress)    Equipment Recommendations  None recommended by PT    Recommendations for Other Services       Precautions / Restrictions Precautions Precautions: Fall Restrictions Weight Bearing Restrictions: No      Mobility  Bed Mobility Overal bed mobility: Needs Assistance Bed Mobility: Sit to Supine;Supine to Sit     Supine to sit: Min guard;Supervision Sit to supine: Min guard;Supervision   General bed mobility comments: inct time, min/guard for safety  Transfers Overall transfer level: Needs assistance Equipment used: Rolling walker (2 wheeled) Transfers: Sit to/from Stand Sit to Stand: Min assist         General transfer comment: cues for safety and hand placement  Ambulation/Gait Ambulation/Gait assistance: Min assist;+2 safety/equipment Ambulation Distance (Feet): 8 Feet (4' fwd and back) Assistive device: Rolling walker (2 wheeled)   Gait velocity: decr   General Gait Details: pt anxious and fearful of falling; cues for RW position and safety  Stairs            Wheelchair Mobility    Modified Rankin (Stroke Patients Only)       Balance Overall balance assessment: Needs assistance;History of Falls           Standing balance-Leahy Scale: Poor                               Pertinent Vitals/Pain C/o LE pain, worse  with WBing, not rated    Home Living Family/patient expects to be discharged to:: Private residence Living Arrangements: Alone   Type of Home: Apartment       Home Layout: One level Home Equipment: Environmental consultant - 2 wheels Additional Comments: eats out most of time    Prior Function Level of Independence: Independent with assistive device(s)               Hand Dominance        Extremity/Trunk Assessment   Upper Extremity Assessment: Defer to OT evaluation           Lower Extremity Assessment: Generalized weakness;RLE deficits/detail;LLE deficits/detail         Communication   Communication: No difficulties  Cognition Arousal/Alertness: Awake/alert Behavior During Therapy: WFL for tasks assessed/performed Overall Cognitive Status: Within Functional Limits for tasks assessed                      General Comments      Exercises        Assessment/Plan    PT Assessment Patient needs continued PT services  PT Diagnosis Difficulty walking;Generalized weakness   PT Problem List Decreased range of motion;Decreased strength;Decreased activity tolerance;Decreased balance;Decreased mobility  PT Treatment Interventions DME instruction;Gait training;Functional mobility training;Therapeutic activities;Therapeutic exercise;Patient/family education   PT Goals (Current goals can be found in the Care Plan section) Acute Rehab PT Goals Patient Stated Goal: home; otherwise states he wants  to sleep PT Goal Formulation: With patient Time For Goal Achievement: 05/06/14 Potential to Achieve Goals: Good    Frequency Min 3X/week   Barriers to discharge        Co-evaluation               End of Session Equipment Utilized During Treatment: Gait belt Activity Tolerance: Patient limited by fatigue Patient left: in bed;with call bell/phone within reach           Time: 0927-0941 PT Time Calculation (min): 14 min   Charges:   PT Evaluation $Initial PT  Evaluation Tier I: 1 Procedure PT Treatments $Gait Training: 8-22 mins   PT G Codes:          Neil Crouch 04/22/2014, 11:31 AM

## 2014-04-22 NOTE — Progress Notes (Signed)
Progress Note   Jeffrey Snow YBO:[id number] DOB: 03/16/1956 DOA: 04/21/2014 PCP: [name],[name], MD   Brief Narrative:   Jeffrey Snow is an 58 y.o. male with a PMH of peripheral neuropathy, hypertension, peripheral arterial disease/arterial insufficiency with chronic intermittent claudication status post arteriogram which showed a right distal SFA occlusion with essentially hypertrophied collaterals from the R profunda femoral artery which constitute a bypass of the R distal SFA occlusion, L popliteal artery is occluded at the knee but numerous geniculate arteries bypass the short segment occlusion, who is under the care of Dr. Bridgett Snow of vascular surgery who recommended conservative care of his arterial disease with maximal medical management including strict control of blood pressure, blood glucose and lipids, regular exercise, smoking cessation. Dr. Bridgett Snow did not think the patient's anesthesia/neuropathy symptoms were from his peripheral arterial disease because of his hypertrophic collaterals.  He was admitted 04/21/14 with right leg/foot pain, weakness, and falls. He also has a worsening rash with petechiae.   Assessment/Plan:   Principal Problem:  Pain in limb / rash and nonspecific skin eruption in a patient with a history of peripheral artery disease/arterial insufficiency/atherosclerosis of native arteries of the extremities with intermittent claudication   Case discussed with Dr. Bridgett Snow of vascular surgery 04/21/14 who reviewed the images above. According to Dr. Vallarie Snow, he had a rash similar to the one on his left leg at the time of the arteriogram. His right leg is worse. He does not feel this is related to the arteriogram and confirms that the patient has good collateral blood flow.   Continue aspirin. Lipids OK.   Followup cryoglobulins to rule out cryoglobulinemia. ESR WNL making vasculitis unlikely. DIC panel negative for schistocytes. HIV nonreactive. Follow up hepatitis  C viral load.  Dopplers negative for DVT(elevated d-dimer).  Thrombocytopenia and purpura likely related to liver disease.   Continue Vicodin as needed for pain control. Try to avoid IV narcotics.  Will treat for possible cellulitis with keflex given elevated serum lactic acid and asymetric erythema. Active Problems:  Thrombocytopenia   Patient has had a significant deterioration in his platelet count which was 115 on 10/12/12.  Likely from liver disease. Depression   Continue Xanax and Prozac. Fall   Physical therapy evaluation performed 04/22/14: SNF recommended. HTN (hypertension)   Continue Lotensin and hydrochlorothiazide. Normocytic anemia   Anemia panel shows a ferritin of 671, folate 9.7, B12 877, iron 65, TIBC 165, 39% saturation. Cirrhosis, alcoholic / coagulopathy   Changes of mild cirrhosis noted on MRI of the abdomen done 01/06/13. Known history of heavy alcohol use in the past.   History of polysubstance abuse in the past. Urine drug screen positive for benzos (on Xanax), HIV nonreactive.   Coagulopathy likely related to cirrhosis. Hyponatremia   May be from dehydration versus beer drinkers potomania. Hydrate and monitor. Hypokalemia   Potassium added to IV fluids. Metabolic acidosis   Mild elevation of serum lactate noted. Hydrate and monitor. Hepatitis C / elevated AFP tumor marker / Abnormal MRI of abdomen, liver  Followup hepatitis C viral load.  AFP elevated and known right hepatic lobe abnormality on imaging raises concern for hepatocellular carcinoma.  IR consult for CT-guided biopsy of liver lesion. T wave inversion in EKG / Elevated troponins   No complaints of chest pain. Troponins elevated, but declining. Continue aspirin.  2-D echocardiogram normal.  No hyperlipidemia.  Re-check 12 lead EKG.  Recheck troponin in a.m. DVT prophylaxis   Heparin/Lovenox contraindicated given thrombocytopenia,  and with claudication, would not use  SCDs.  Code Status: Full. Family Communication: No family at the bedside. Disposition Plan: Home vs. SNF when stable.   IV Access:    Peripheral IV   Procedures:    2 D Echo 04/22/14: EF 60-65%, no regional wall motion abnormalities.  Left ventricular diastolic function parameters were normal.  a   Medical Consultants:    Interventional radiology   Other Consultants:    None.   Anti-Infectives:    None.  Subjective:   Jeffrey Snow says the pain medicine has helped his leg pain, but has not alleviated it entirely.  Pain limited PT evaluation, and patient states that he felt like he was going to fall, while up.  No chest pain.  No dyspnea.  No palpitations.  Objective:    Filed Vitals:   04/21/14 1830 04/21/14 2106 04/22/14 0550 04/22/14 1334  BP:  109/67 120/70 123/81  Pulse:  79 69 66  Temp:  97.8 F (36.6 C) 97.7 F (36.5 C) 98.1 F (36.7 C)  TempSrc:  Oral Oral Oral  Resp:  _0 Height: _1  (1.702 m)     Weight: 81.647 kg (180 lb)     SpO2:  98% 99% 98%    Intake/Output Summary (Last 24 hours) at 04/22/14 1425 Last data filed at 04/22/14 0600  Gross per 24 hour  Intake    900 ml  Output    250 ml  Net    650 ml    Exam: Gen:  NAD Cardiovascular:  RRR, No M/R/G Respiratory:  Lungs CTAB Gastrointestinal:  Abdomen soft, NT/ND, + BS Extremities:  No change in appearance (see photos documented on H&P exam).   Data Reviewed:    Labs: Basic Metabolic Panel:  Recent Labs Lab 04/21/14 1159 04/22/14 0530  NA 127* 130*  K 3.6* 3.8  CL 89* 94*  CO2 15* 22  GLUCOSE 135* 84  BUN 12 15  CREATININE 1.29 1.28  CALCIUM 8.6 8.3*   GFR Estimated Creatinine Clearance: 63.5 ml/min (by C-G formula based on Cr of 1.28). Liver Function Tests:  Recent Labs Lab 04/21/14 1159  AST 48*  ALT 30  ALKPHOS 69  BILITOT 4.1*  PROT 5.4*  ALBUMIN 2.3*   Coagulation profile  Recent Labs Lab 04/21/14 1700 04/22/14 0951  INR 1.89*  1.77*    CBC:  Recent Labs Lab 04/21/14 1159 04/21/14 1700 04/22/14 0951  WBC 4.4  --  4.8  NEUTROABS 3.7  --   --   HGB 9.5*  --  9.8*  HCT 26.5*  --  27.4*  MCV 92.7  --  92.9  PLT 29* 28* 36*   Cardiac Enzymes:  Recent Labs Lab 04/21/14 1700 04/21/14 2229 04/22/14 0530  TROPONINI 0.65* 0.73* 0.44*   D-Dimer:  Recent Labs  04/21/14 1700  DDIMER 6.77*   Anemia work up:  Recent Labs  04/21/14 1700 04/21/14 2229  VITAMINB12 877  --   FOLATE  --  9.7  FERRITIN  --  671*  TIBC  --  165*  IRON  --  65  RETICCTPCT 2.0  --    Sepsis Labs:  Recent Labs Lab 04/21/14 1159 04/21/14 1417 04/22/14 0951  WBC 4.4  --  4.8  LATICACIDVEN  --  3.2*  --    Microbiology No results found for this or any previous visit (from the past 240 hour(s)).   Radiographs/Studies:   Dg Tibia/fibula Right  04/21/2014  CLINICAL DATA:  Fall.  EXAM: RIGHT TIBIA AND FIBULA - 2 VIEW  COMPARISON:  None.  FINDINGS: No acute bony or joint abnormality identified. No fracture or dislocation. Peripheral vascular calcification present.  IMPRESSION: No acute bony or joint abnormality.   Electronically Signed   By: Marcello Moores  Register   On: 04/21/2014 13:08   Dg Foot Complete Right  03/31/2014   CLINICAL DATA:  right foot pain  EXAM: RIGHT FOOT COMPLETE - 3+ VIEW  COMPARISON:  None.  FINDINGS: There is no evidence of fracture or dislocation. There is no evidence of arthropathy or other focal bone abnormality. Soft tissues are unremarkable.  IMPRESSION: Negative.   Electronically Signed   By: Margaree Mackintosh M.D.   On: 03/31/2014 14:22    Medications:   . ALPRAZolam  0.5 mg Oral TID  . aspirin  81 mg Oral Daily  . benazepril  40 mg Oral Daily  . cephALEXin  500 mg Oral 4 times per day  . FLUoxetine  20 mg Oral Daily  . folic acid  1 mg Oral Daily  . hydrochlorothiazide  25 mg Oral Daily  . thiamine  100 mg Oral Daily  . zolpidem  10 mg Oral QHS   Continuous Infusions: . 0.9 % NaCl with  KCl 20 mEq / L 75 mL/hr at 04/22/14 0547    Time spent: 35 minutes with > 50% of time discussing current diagnostic test results, clinical impression and plan of care.    LOS: 1 day   [place]  [facility] Pager [phone]. If unable to reach me by pager, please call my cell phone at [phone].  *Please refer to [email or web address], password [email or web address] to get updated schedule on who will round on this patient, as hospitalists switch teams weekly. If 7PM-7AM, please contact night-coverage at [email or web address], password [email or web address] for any overnight needs.  04/22/2014, 2:25 PM    **Disclaimer: This note was dictated with voice recognition software. Similar sounding words can inadvertently be transcribed and this note may contain transcription errors which may not have been corrected upon publication of note.**

## 2014-04-22 NOTE — Progress Notes (Signed)
Clinical Social Work Department BRIEF PSYCHOSOCIAL ASSESSMENT 04/22/2014  Patient:  Jeffrey Snow, Jeffrey Snow     Account Number:  [id number]     Admit date:  04/21/2014  Clinical Social Worker:  Earlie Server  Date/Time:  04/22/2014 02:45 PM  Referred by:  Physician  Date Referred:  04/22/2014 Referred for  Psychosocial assessment   Other Referral:   Interview type:  Patient Other interview type:    PSYCHOSOCIAL DATA Living Status:  ALONE Admitted from facility:   Level of care:   Primary support name:  John Primary support relationship to patient:  SIBLING Degree of support available:   Adequate    CURRENT CONCERNS Current Concerns  Post-Acute Placement  Substance Abuse   Other Concerns:    SOCIAL WORK ASSESSMENT / PLAN CSW received referral to complete psychosocial assessment. CSW reviewed chart and met with patient at bedside. CSW introduced myself and explained role.    Patient reports that he lives in [address] housing and that he has been doing fairly well on his own. Patient reports he uses walker inconsistently and has several falls at home. Patient reports medical problems with legs as well that contributes to leg problems. CSW inquired about medication compliance or substance use that could contribute to falling. Patient agreeable to complete SBIRT and reports he drinks about 2 12oz beers a day and 4 12 oz beers on the weekend. Patient reports that he enjoys drinking but does not feel any emotional connection with drinking. CSW encouraged patient to reduce and/or eliminate drinking habits. Patient drinks socially and reports no desire for resources for substance use.    CSW and patient discussed PT recommendations for possible SNF stay. Patient is not interested in SNF because he does not want to lost his apartment at [facility]. Patient aware that CSW could look for SNF placement that accepts his Medicaid but he would have to supplement payments with his disability  check. Patient reports he will return home with the support of friends.    CSW is signing off but available if further needs arise.   Assessment/plan status:  Psychosocial Support/Ongoing Assessment of Needs Other assessment/ plan:   SBIRT   Information/referral to community resources:   SNF list    PATIENT'S/FAMILY'S RESPONSE TO PLAN OF CARE: Patient alert and oriented. Patient agreeable to discuss substance use but guarded when discussing treatment. Patient does not believe that substance use and medical wellbeing are connected. Patient is not interested in SNF placement and feels if he uses his walker all the time then he will not have as many falls. Patient engaged during assessment but denies any CSW needs.       [place], [place] [phone]

## 2014-04-22 NOTE — Progress Notes (Signed)
VASCULAR LAB PRELIMINARY  PRELIMINARY  PRELIMINARY  PRELIMINARY  Bilateral lower extremity venous duplex  completed.    Preliminary report:  Bilateral:  No evidence of DVT, superficial thrombosis, or Baker's Cyst.    Nani Ravens, RVT 04/22/2014, 10:42 AM

## 2014-04-22 NOTE — Progress Notes (Signed)
Patient ID: Jeffrey Snow, male   DOB: 13-Jan-1956, 58 y.o.   MRN: [id number] Request received for possible liver lesion biopsy on pt. Most recent imaging study(MRI liver 01/2013) was reviewed by Dr. Kathlene Cote. Pt also with elevated AFP-75 , thrombocytopenia (plts 29k)/elevated troponins. Per Dr. Kathlene Cote, pt needs f/u MRI abdomen with and without contrast to evaluate for lesion growth before contemplating any further intervention. Bx may not be indicated if lesion growth demonstrated and AFP remains elevated. Will cont to monitor and make further recommendations after MRI obtained.

## 2014-04-22 NOTE — Progress Notes (Signed)
Echocardiogram 2D Echocardiogram has been performed.  Jeffrey Snow 04/22/2014, 9:12 AM

## 2014-04-23 DIAGNOSIS — W19XXXA Unspecified fall, initial encounter: Secondary | ICD-10-CM

## 2014-04-23 DIAGNOSIS — R7989 Other specified abnormal findings of blood chemistry: Secondary | ICD-10-CM

## 2014-04-23 DIAGNOSIS — R778 Other specified abnormalities of plasma proteins: Secondary | ICD-10-CM | POA: Diagnosis present

## 2014-04-23 LAB — COMPREHENSIVE METABOLIC PANEL
ALT: 31 U/L (ref 0–53)
AST: 58 U/L — ABNORMAL HIGH (ref 0–37)
Albumin: 2.3 g/dL — ABNORMAL LOW (ref 3.5–5.2)
Alkaline Phosphatase: 69 U/L (ref 39–117)
BUN: 16 mg/dL (ref 6–23)
CO2: 23 mEq/L (ref 19–32)
Calcium: 8.1 mg/dL — ABNORMAL LOW (ref 8.4–10.5)
Chloride: 100 mEq/L (ref 96–112)
Creatinine, Ser: 1.19 mg/dL (ref 0.50–1.35)
GFR calc Af Amer: 76 mL/min — ABNORMAL LOW (ref >90)
GFR calc non Af Amer: 65 mL/min — ABNORMAL LOW (ref >90)
Glucose, Bld: 81 mg/dL (ref 70–99)
Potassium: 4.3 mEq/L (ref 3.7–5.3)
Sodium: 132 mEq/L — ABNORMAL LOW (ref 137–147)
Total Bilirubin: 3.1 mg/dL — ABNORMAL HIGH (ref 0.3–1.2)
Total Protein: 5.2 g/dL — ABNORMAL LOW (ref 6.0–8.3)

## 2014-04-23 LAB — TROPONIN I: Troponin I: <0.3 ng/mL (ref <0.30)

## 2014-04-23 LAB — LACTIC ACID, PLASMA: Lactic Acid, Venous: 1.2 mmol/L (ref 0.5–2.2)

## 2014-04-23 MED ORDER — VANCOMYCIN HCL IN DEXTROSE 1-5 GM/200ML-% IV SOLN
1000.0000 mg | Freq: Two times a day (BID) | INTRAVENOUS | Status: DC
  Administered 2014-04-23 – 2014-04-26 (×6): 1000 mg via INTRAVENOUS
  Filled 2014-04-23 (×6): qty 200

## 2014-04-23 NOTE — Progress Notes (Deleted)
CRITICAL VALUE ALERT  Critical value received:  hgb 6.9  Date of notification:  5/9  Time of notification:  0623  Critical value read back:yes  Nurse who received alert:  Hanako Tipping RN  MD notified (1st page):  T. callahan  Time of first page:  0630  MD notified (2nd page):  Time of second page:  Responding MD:   Time MD responded:

## 2014-04-23 NOTE — Progress Notes (Signed)
Progress Note   Jeffrey Snow [id number] DOB: Apr 26, 1956 DOA: 04/21/2014 PCP: [name],[name], MD   Brief Narrative:   Jeffrey Snow is an 58 y.o. male with a PMH of peripheral neuropathy, hypertension, peripheral arterial disease/arterial insufficiency with chronic intermittent claudication status post arteriogram which showed a right distal SFA occlusion with essentially hypertrophied collaterals from the R profunda femoral artery which constitute a bypass of the R distal SFA occlusion, L popliteal artery is occluded at the knee but numerous geniculate arteries bypass the short segment occlusion, who is under the care of Dr. Bridgett Larsson of vascular surgery who recommended conservative care of his arterial disease with maximal medical management including strict control of blood pressure, blood glucose and lipids, regular exercise, smoking cessation. Dr. Bridgett Larsson did not think the patient's anesthesia/neuropathy symptoms were from his peripheral arterial disease because of his hypertrophic collaterals.  He was admitted 04/21/14 with right leg/foot pain, weakness, and falls. He also has a worsening rash with petechiae.   Assessment/Plan:   Principal Problem:  Pain in limb / rash and nonspecific skin eruption in a patient with a history of peripheral artery disease/arterial insufficiency/atherosclerosis of native arteries of the extremities with intermittent claudication   Case discussed with Dr. Bridgett Larsson of vascular surgery 04/21/14 who reviewed the images above. According to Dr. Vallarie Mare, he had a rash similar to the one on his left leg at the time of the arteriogram. His right leg is worse. He does not feel this is related to the arteriogram and confirms that the patient has good collateral blood flow.   Continue aspirin. Lipids OK.   Followup cryoglobulins to rule out cryoglobulinemia. ESR WNL making vasculitis unlikely. DIC panel negative for schistocytes. HIV nonreactive. Follow up hepatitis  C viral load.  Dopplers negative for DVT(elevated d-dimer).  Thrombocytopenia and purpura likely related to liver disease.   Continue Vicodin as needed for pain control. Avoid IV narcotics.  Change Keflex to Vancomycin given recent arteriogram to cover MRSA. Lactic acid WNL this morning. Active Problems:  Thrombocytopenia   Patient has had a significant deterioration in his platelet count which was 115 on 10/12/12.  Likely from liver disease. Depression   Continue Xanax and Prozac. Fall   Physical therapy evaluation performed 04/22/14: SNF recommended. HTN (hypertension)   Continue Lotensin and hydrochlorothiazide. Normocytic anemia   Anemia panel shows a ferritin of 671, folate 9.7, B12 877, iron 65, TIBC 165, 39% saturation. Cirrhosis, alcoholic / coagulopathy   Changes of mild cirrhosis noted on MRI of the abdomen done 01/06/13. Known history of heavy alcohol use in the past.   History of polysubstance abuse in the past. Urine drug screen positive for benzos (on Xanax), HIV nonreactive.   Coagulopathy likely related to cirrhosis. Hyponatremia   May be from dehydration versus beer drinkers potomania. Improving with IV fluids. Hypokalemia   Resolved with potassium added to IV fluids. Metabolic acidosis   Mild elevation of serum lactate noted. Resolved with hydration. Hepatitis C / elevated AFP tumor marker / Abnormal MRI of abdomen, liver  Followup hepatitis C viral load.  AFP elevated and known right hepatic lobe abnormality on imaging raises concern for hepatocellular carcinoma.  Repeat MRI recommended by interventional radiology. T wave inversion in EKG / Elevated troponins   No complaints of chest pain. Troponins elevated, but declining. Continue aspirin.  2-D echocardiogram normal.  No hyperlipidemia.  Re-check 12 lead EKG 04/22/14 shows persistent T wave inversions in V1/V2.  Troponin now normal. DVT prophylaxis  Heparin/Lovenox contraindicated given  thrombocytopenia, and with claudication, would not use SCDs.  Code Status: Full. Family Communication: No family at the bedside. Disposition Plan: Home vs. SNF when stable.   IV Access:    Peripheral IV   Procedures:    2 D Echo 04/22/14: EF 60-65%, no regional wall motion abnormalities.  Left ventricular diastolic function parameters were normal.  a   Medical Consultants:    Interventional radiology   Other Consultants:    None.   Anti-Infectives:    None.  Subjective:   Jeffrey Snow continues to have foot pain.  He is very fastidious and is upset that he still has items on his bedside tray left over from breakfast.  No dyspnea or cough.  No nausea or vomiting.  Objective:    Filed Vitals:   04/22/14 0550 04/22/14 1334 04/22/14 2148 04/23/14 0520  BP: 120/70 123/81 134/77 122/67  Pulse: 69 66 70 74  Temp: 97.7 F (36.5 C) 98.1 F (36.7 C)  97.4 F (36.3 C)  TempSrc: Oral Oral  Axillary  Resp: _0 Height:      Weight:      SpO2: 99% 98% 97% 96%    Intake/Output Summary (Last 24 hours) at 04/23/14 0806 Last data filed at 04/22/14 1456  Gross per 24 hour  Intake      0 ml  Output    100 ml  Net   -100 ml    Exam: Gen:  NAD, irritable Cardiovascular:  RRR, No M/R/G Respiratory:  Lungs CTAB Gastrointestinal:  Abdomen soft, NT/ND, + BS Extremities:  No change in appearance (see photos documented on H&P exam).   Data Reviewed:    Labs: Basic Metabolic Panel:  Recent Labs Lab 04/21/14 1159 04/22/14 0530 04/23/14 0533  NA 127* 130* 132*  K 3.6* 3.8 4.3  CL 89* 94* 100  CO2 15* 22 23  GLUCOSE 135* 84 81  BUN _1 CREATININE 1.29 1.28 1.19  CALCIUM 8.6 8.3* 8.1*   GFR Estimated Creatinine Clearance: 68.4 ml/min (by C-G formula based on Cr of 1.19). Liver Function Tests:  Recent Labs Lab 04/21/14 1159 04/23/14 0533  AST 48* 58*  ALT 30 31  ALKPHOS 69 69  BILITOT 4.1* 3.1*  PROT 5.4* 5.2*  ALBUMIN 2.3*  2.3*   Coagulation profile  Recent Labs Lab 04/21/14 1700 04/22/14 0951  INR 1.89* 1.77*    CBC:  Recent Labs Lab 04/21/14 1159 04/21/14 1700 04/22/14 0951  WBC 4.4  --  4.8  NEUTROABS 3.7  --   --   HGB 9.5*  --  9.8*  HCT 26.5*  --  27.4*  MCV 92.7  --  92.9  PLT 29* 28* 36*   Cardiac Enzymes:  Recent Labs Lab 04/21/14 1700 04/21/14 2229 04/22/14 0530 04/23/14 0533  TROPONINI 0.65* 0.73* 0.44* <0.30   D-Dimer:  Recent Labs  04/21/14 1700  DDIMER 6.77*   Anemia work up:  Recent Labs  04/21/14 1700 04/21/14 2229  VITAMINB12 877  --   FOLATE  --  9.7  FERRITIN  --  671*  TIBC  --  165*  IRON  --  65  RETICCTPCT 2.0  --    Sepsis Labs:  Recent Labs Lab 04/21/14 1159 04/21/14 1417 04/22/14 0951 04/23/14 0534  WBC 4.4  --  4.8  --   LATICACIDVEN  --  3.2*  --  1.2   Microbiology No results found for this or  any previous visit (from the past 240 hour(s)).   Radiographs/Studies:   Dg Tibia/fibula Right  04/21/2014   CLINICAL DATA:  Fall.  EXAM: RIGHT TIBIA AND FIBULA - 2 VIEW  COMPARISON:  None.  FINDINGS: No acute bony or joint abnormality identified. No fracture or dislocation. Peripheral vascular calcification present.  IMPRESSION: No acute bony or joint abnormality.   Electronically Signed   By: Marcello Moores  Register   On: 04/21/2014 13:08   Dg Foot Complete Right  03/31/2014   CLINICAL DATA:  right foot pain  EXAM: RIGHT FOOT COMPLETE - 3+ VIEW  COMPARISON:  None.  FINDINGS: There is no evidence of fracture or dislocation. There is no evidence of arthropathy or other focal bone abnormality. Soft tissues are unremarkable.  IMPRESSION: Negative.   Electronically Signed   By: Margaree Mackintosh M.D.   On: 03/31/2014 14:22    Medications:   . ALPRAZolam  0.5 mg Oral TID  . aspirin  81 mg Oral Daily  . benazepril  40 mg Oral Daily  . cephALEXin  500 mg Oral 4 times per day  . FLUoxetine  20 mg Oral Daily  . folic acid  1 mg Oral Daily  .  hydrocerin   Topical BID  . hydrochlorothiazide  25 mg Oral Daily  . thiamine  100 mg Oral Daily  . zolpidem  10 mg Oral QHS   Continuous Infusions: . 0.9 % NaCl with KCl 20 mEq / L 75 mL/hr at 04/22/14 1833    Time spent: 35 minutes.  The patient requires high complexity decision making and coordination of care.    LOS: 2 days   [place]  [facility] Pager [phone]. If unable to reach me by pager, please call my cell phone at [phone].  *Please refer to [email or web address], password [email or web address] to get updated schedule on who will round on this patient, as hospitalists switch teams weekly. If 7PM-7AM, please contact night-coverage at [email or web address], password [email or web address] for any overnight needs.  04/23/2014, 8:06 AM    **Disclaimer: This note was dictated with voice recognition software. Similar sounding words can inadvertently be transcribed and this note may contain transcription errors which may not have been corrected upon publication of note.**

## 2014-04-23 NOTE — Progress Notes (Signed)
ANTIBIOTIC CONSULT NOTE - INITIAL  Pharmacy Consult for Vancomycin Indication: Cellulitis  No Known Allergies  Patient Measurements: Height: 5\' 7"  (170.2 cm) Weight: 180 lb (81.647 kg) IBW/kg (Calculated) : 66.1  Vital Signs: Temp: 97.4 F (36.3 C) (05/09 0520) Temp src: Axillary (05/09 0520) BP: 122/67 mmHg (05/09 0520) Pulse Rate: 74 (05/09 0520) Intake/Output from previous day: 05/08 0701 - 05/09 0700 In: -  Out: 100 [Urine:100] Intake/Output from this shift: Total I/O In: 2157.5 [I.V.:2157.5] Out: -   Labs:  Recent Labs  04/21/14 1159 04/21/14 1700 04/22/14 0530 04/22/14 0951 04/23/14 0533  WBC 4.4  --   --  4.8  --   HGB 9.5*  --   --  9.8*  --   PLT 29* 28*  --  36*  --   CREATININE 1.29  --  1.28  --  1.19   Estimated Creatinine Clearance: 68.4 ml/min (by C-G formula based on Cr of 1.19). No results found for this basename: VANCOTROUGH, VANCOPEAK, VANCORANDOM, GENTTROUGH, GENTPEAK, GENTRANDOM, TOBRATROUGH, TOBRAPEAK, TOBRARND, AMIKACINPEAK, AMIKACINTROU, AMIKACIN,  in the last 72 hours   Microbiology: No results found for this or any previous visit (from the past 720 hour(s)).  Medical History: Past Medical History  Diagnosis Date  . Depression   . Anxiety   . Panic attack   . Hypertension   . Hepatitis C   . Claudication of left lower extremity m-1  . Abnormal MRI of abdomen, liver  04/21/2014  . Cirrhosis, alcoholic 01/16/3085    Assessment: 53 yoM with alcoholic cirrhosis and severe PVOD of bilateral LEs.  Patient has been on Keflex for possible cellulitis.  Patient has rash and nonspecific skin eruption on LLE.  Today, MD changing Keflex to Vancomycin given recent arteriogram to cover MRSA.  5/8 >> Keflex >> 5/9 5/9 >> Vancomycin >>  Tmax24h: afebrile WBC: WNL Renal: SCr 1.19, CrCl ~67 ml/min (normalized), ~77 ml/min (CG) No micro data.  Goal of Therapy:  Vancomycin trough level 10-15 mcg/ml  Plan:  1.  Vancomycin 1g IV q12h. 2.  F/u  SCr, trough levels, clinical course.  Donald Prose Mary Hockey 04/23/2014,11:30 AM

## 2014-04-23 NOTE — Progress Notes (Signed)
Per RN, Pt asked to speak with CSW.  Per MD, Pt needs SNF.  Met with Pt who stated that he wanted to know if his M'caid was going to cover his hospital stay.  CSW explained to Pt that CSW was unsure but stated that, if he does, there will be a number listed on the bill that Pt can call to discuss his financial situation and capabilities.  Pt stated that he doesn't have additional money to pay any hospital bill, as he's on a fixed income.  CSW broached the SNF subject with Pt, as my co-worker did before me.  Pt was adamant that he wasn't going to a SNF, even though he admitted that he can't walk and that he's fallen recently.  Pt stated that he cannot be put in a position to turn over his M'caid check to a facility, as he has bills to pay and a life to live.  CSW voiced an understanding.  No further CSW needs identified.  Bernita Raisin, [place] [facility] [phone]

## 2014-04-24 DIAGNOSIS — L02419 Cutaneous abscess of limb, unspecified: Secondary | ICD-10-CM | POA: Diagnosis present

## 2014-04-24 DIAGNOSIS — R799 Abnormal finding of blood chemistry, unspecified: Secondary | ICD-10-CM

## 2014-04-24 DIAGNOSIS — L03119 Cellulitis of unspecified part of limb: Secondary | ICD-10-CM

## 2014-04-24 LAB — CBC
HCT: 24.9 % — ABNORMAL LOW (ref 39.0–52.0)
Hemoglobin: 8.8 g/dL — ABNORMAL LOW (ref 13.0–17.0)
MCH: 33 pg (ref 26.0–34.0)
MCHC: 35.3 g/dL (ref 30.0–36.0)
MCV: 93.3 fL (ref 78.0–100.0)
Platelets: 32 10*3/uL — ABNORMAL LOW (ref 150–400)
RBC: 2.67 MIL/uL — ABNORMAL LOW (ref 4.22–5.81)
RDW: 15.7 % — ABNORMAL HIGH (ref 11.5–15.5)
WBC: 2.2 10*3/uL — ABNORMAL LOW (ref 4.0–10.5)

## 2014-04-24 LAB — COMPREHENSIVE METABOLIC PANEL
ALT: 30 U/L (ref 0–53)
AST: 50 U/L — ABNORMAL HIGH (ref 0–37)
Albumin: 2.2 g/dL — ABNORMAL LOW (ref 3.5–5.2)
Alkaline Phosphatase: 65 U/L (ref 39–117)
BUN: 18 mg/dL (ref 6–23)
CO2: 21 mEq/L (ref 19–32)
Calcium: 8 mg/dL — ABNORMAL LOW (ref 8.4–10.5)
Chloride: 100 mEq/L (ref 96–112)
Creatinine, Ser: 1.16 mg/dL (ref 0.50–1.35)
GFR calc Af Amer: 78 mL/min — ABNORMAL LOW (ref >90)
GFR calc non Af Amer: 67 mL/min — ABNORMAL LOW (ref >90)
Glucose, Bld: 70 mg/dL (ref 70–99)
Potassium: 4.5 mEq/L (ref 3.7–5.3)
Sodium: 132 mEq/L — ABNORMAL LOW (ref 137–147)
Total Bilirubin: 2.4 mg/dL — ABNORMAL HIGH (ref 0.3–1.2)
Total Protein: 5.2 g/dL — ABNORMAL LOW (ref 6.0–8.3)

## 2014-04-24 NOTE — Progress Notes (Addendum)
Progress Note   Jeffrey Snow JJO:[id number] DOB: June 14, 1956 DOA: 04/21/2014 PCP: [name],[name], MD   Brief Narrative:   Jeffrey Snow is an 58 y.o. male with a PMH of peripheral neuropathy, hypertension, peripheral arterial disease/arterial insufficiency with chronic intermittent claudication status post arteriogram which showed a right distal SFA occlusion with essentially hypertrophied collaterals from the R profunda femoral artery which constitute a bypass of the R distal SFA occlusion, L popliteal artery is occluded at the knee but numerous geniculate arteries bypass the short segment occlusion, who is under the care of Dr. Bridgett Larsson of vascular surgery who recommended conservative care of his arterial disease with maximal medical management including strict control of blood pressure, blood glucose and lipids, regular exercise, smoking cessation. Dr. Bridgett Larsson did not think the patient's anesthesia/neuropathy symptoms were from his peripheral arterial disease because of his hypertrophic collaterals.  He was admitted 04/21/14 with right leg/foot pain, weakness, and falls. He also has a worsening rash with petechiae.   Assessment/Plan:   Principal Problem:  Cellulitis RLE / Pain in limb / rash and nonspecific skin eruption in a patient with a history of peripheral artery disease/arterial insufficiency/atherosclerosis of native arteries of the extremities with intermittent claudication   Case discussed with Dr. Bridgett Larsson of vascular surgery 04/21/14 who reviewed the images above. According to Dr. Vallarie Mare, he had a rash similar to the one on his left leg at the time of the arteriogram. His right leg is worse. He does not feel this is related to the arteriogram and confirms that the patient has good collateral blood flow.   Continue aspirin. Lipids OK.   Followup cryoglobulins to rule out cryoglobulinemia. ESR WNL making vasculitis unlikely. DIC panel negative for schistocytes. HIV nonreactive.  Follow up hepatitis C viral load.  Dopplers negative for DVT(elevated d-dimer).  Thrombocytopenia and purpura likely related to liver disease.   Continue Vicodin as needed for pain control. Avoid IV narcotics.  Continue Vancomycin given recent arteriogram to cover MRSA.  Active Problems:  Pancytopenia  Patient has had a significant deterioration in his platelet count which was 115 on 10/12/12.  His WBC and hemoglobin are also low, possibly from bone marrow suppression/liver disease.  Will need a hematology evaluation. Depression   Continue Xanax and Prozac. Fall   Physical therapy evaluation performed 04/22/14: SNF recommended, however patient has refused even though he cannot walk. HTN (hypertension)   Continue Lotensin and hydrochlorothiazide. Normocytic anemia   Anemia panel shows a ferritin of 671, folate 9.7, B12 877, iron 65, TIBC 165, 39% saturation. Cirrhosis, alcoholic / coagulopathy   Changes of mild cirrhosis noted on MRI of the abdomen done 01/06/13. Known history of heavy alcohol use in the past.   History of polysubstance abuse in the past. Urine drug screen positive for benzos (on Xanax), HIV nonreactive.   Coagulopathy likely related to cirrhosis. Hyponatremia   May be from dehydration versus beer drinkers potomania. Improving with IV fluids. Hypokalemia   Resolved with potassium added to IV fluids. Metabolic acidosis   Mild elevation of serum lactate noted. Resolved with hydration. Hepatitis C / elevated AFP tumor marker / Abnormal MRI of abdomen, liver  Followup hepatitis C viral load.  AFP elevated and known right hepatic lobe abnormality on imaging raises concern for hepatocellular carcinoma.  Repeat MRI recommended by interventional radiology, which has been ordered and is pending. T wave inversion in EKG / Elevated troponins   No complaints of chest pain. Troponins elevated, but declining. Continue  aspirin.  2-D echocardiogram normal.  No  hyperlipidemia.  Re-check 12 lead EKG 04/22/14 shows persistent T wave inversions in V1/V2.  Troponin now normal. DVT prophylaxis   Heparin/Lovenox contraindicated given thrombocytopenia, and with claudication, would not use SCDs.  Code Status: Full. Family Communication: No family at the bedside. Disposition Plan: Home vs. SNF when stable.   IV Access:    Peripheral IV   Procedures:    2 D Echo 04/22/14: EF 60-65%, no regional wall motion abnormalities.  Left ventricular diastolic function parameters were normal.  a   Medical Consultants:    Interventional radiology   Other Consultants:    None.   Anti-Infectives:    None.  Subjective:   Jeffrey Snow is still having foot/leg pain on the right, though he does report some improvement with pain medication. No chest pains, no dyspnea, occasional cough.  Objective:    Filed Vitals:   04/23/14 0520 04/23/14 1502 04/23/14 2147 04/24/14 0634  BP: 122/67 113/68 111/67 103/65  Pulse: 74 80 83 78  Temp: 97.4 F (36.3 C) 97.8 F (36.6 C) 97.6 F (36.4 C) 97.6 F (36.4 C)  TempSrc: Axillary Oral Oral Oral  Resp: _0 Height:      Weight:      SpO2: 96% 97% 94% 95%    Intake/Output Summary (Last 24 hours) at 04/24/14 9924 Last data filed at 04/23/14 1951  Gross per 24 hour  Intake 2157.5 ml  Output    300 ml  Net 1857.5 ml    Exam: Gen:  NAD Cardiovascular:  RRR, No M/R/G Respiratory:  Lungs CTAB Gastrointestinal:  Abdomen soft, NT/ND, + BS Extremities:  RLE shows some clearing of erythema, palpable purpura BLE unchanged. Right leg:   Left leg:     Data Reviewed:    Labs: Basic Metabolic Panel:  Recent Labs Lab 04/21/14 1159 04/22/14 0530 04/23/14 0533  NA 127* 130* 132*  K 3.6* 3.8 4.3  CL 89* 94* 100  CO2 15* 22 23  GLUCOSE 135* 84 81  BUN _1 CREATININE 1.29 1.28 1.19  CALCIUM 8.6 8.3* 8.1*   GFR Estimated Creatinine Clearance: 68.4 ml/min (by C-G formula  based on Cr of 1.19). Liver Function Tests:  Recent Labs Lab 04/21/14 1159 04/23/14 0533  AST 48* 58*  ALT 30 31  ALKPHOS 69 69  BILITOT 4.1* 3.1*  PROT 5.4* 5.2*  ALBUMIN 2.3* 2.3*   Coagulation profile  Recent Labs Lab 04/21/14 1700 04/22/14 0951  INR 1.89* 1.77*    CBC:  Recent Labs Lab 04/21/14 1159 04/21/14 1700 04/22/14 0951 04/24/14 0757  WBC 4.4  --  4.8 2.2*  NEUTROABS 3.7  --   --   --   HGB 9.5*  --  9.8* 8.8*  HCT 26.5*  --  27.4* 24.9*  MCV 92.7  --  92.9 93.3  PLT 29* 28* 36* 32*   Cardiac Enzymes:  Recent Labs Lab 04/21/14 1700 04/21/14 2229 04/22/14 0530 04/23/14 0533  TROPONINI 0.65* 0.73* 0.44* <0.30   D-Dimer:  Recent Labs  04/21/14 1700  DDIMER 6.77*   Anemia work up:  Recent Labs  04/21/14 1700 04/21/14 2229  VITAMINB12 877  --   FOLATE  --  9.7  FERRITIN  --  671*  TIBC  --  165*  IRON  --  65  RETICCTPCT 2.0  --    Sepsis Labs:  Recent Labs Lab 04/21/14 1159 04/21/14 1417 04/22/14 0951 04/23/14  0534 04/24/14 0757  WBC 4.4  --  4.8  --  2.2*  LATICACIDVEN  --  3.2*  --  1.2  --    Microbiology No results found for this or any previous visit (from the past 240 hour(s)).   Radiographs/Studies:   Dg Tibia/fibula Right  04/21/2014   CLINICAL DATA:  Fall.  EXAM: RIGHT TIBIA AND FIBULA - 2 VIEW  COMPARISON:  None.  FINDINGS: No acute bony or joint abnormality identified. No fracture or dislocation. Peripheral vascular calcification present.  IMPRESSION: No acute bony or joint abnormality.   Electronically Signed   By: Marcello Moores  Register   On: 04/21/2014 13:08   Dg Foot Complete Right  03/31/2014   CLINICAL DATA:  right foot pain  EXAM: RIGHT FOOT COMPLETE - 3+ VIEW  COMPARISON:  None.  FINDINGS: There is no evidence of fracture or dislocation. There is no evidence of arthropathy or other focal bone abnormality. Soft tissues are unremarkable.  IMPRESSION: Negative.   Electronically Signed   By: Margaree Mackintosh M.D.    On: 03/31/2014 14:22    Medications:   . ALPRAZolam  0.5 mg Oral TID  . aspirin  81 mg Oral Daily  . benazepril  40 mg Oral Daily  . FLUoxetine  20 mg Oral Daily  . folic acid  1 mg Oral Daily  . hydrocerin   Topical BID  . hydrochlorothiazide  25 mg Oral Daily  . thiamine  100 mg Oral Daily  . vancomycin  1,000 mg Intravenous Q12H  . zolpidem  10 mg Oral QHS   Continuous Infusions: . 0.9 % NaCl with KCl 20 mEq / L 75 mL/hr at 04/23/14 2109    Time spent: 35 minutes.  The patient requires high complexity decision making and coordination of care.    LOS: 3 days   [place]  [facility] Pager [phone]. If unable to reach me by pager, please call my cell phone at [phone].  *Please refer to [email or web address], password [email or web address] to get updated schedule on who will round on this patient, as hospitalists switch teams weekly. If 7PM-7AM, please contact night-coverage at [email or web address], password [email or web address] for any overnight needs.  04/24/2014, 8:22 AM    **Disclaimer: This note was dictated with voice recognition software. Similar sounding words can inadvertently be transcribed and this note may contain transcription errors which may not have been corrected upon publication of note.**

## 2014-04-25 ENCOUNTER — Ambulatory Visit: Payer: Medicaid Other | Admitting: Internal Medicine

## 2014-04-25 ENCOUNTER — Inpatient Hospital Stay (HOSPITAL_COMMUNITY): Payer: Medicaid Other

## 2014-04-25 DIAGNOSIS — D638 Anemia in other chronic diseases classified elsewhere: Secondary | ICD-10-CM

## 2014-04-25 DIAGNOSIS — L03119 Cellulitis of unspecified part of limb: Secondary | ICD-10-CM

## 2014-04-25 DIAGNOSIS — K7460 Unspecified cirrhosis of liver: Secondary | ICD-10-CM

## 2014-04-25 DIAGNOSIS — D72819 Decreased white blood cell count, unspecified: Secondary | ICD-10-CM

## 2014-04-25 DIAGNOSIS — K769 Liver disease, unspecified: Secondary | ICD-10-CM

## 2014-04-25 DIAGNOSIS — L02419 Cutaneous abscess of limb, unspecified: Secondary | ICD-10-CM

## 2014-04-25 LAB — COMPREHENSIVE METABOLIC PANEL
ALT: 29 U/L (ref 0–53)
AST: 54 U/L — ABNORMAL HIGH (ref 0–37)
Albumin: 2 g/dL — ABNORMAL LOW (ref 3.5–5.2)
Alkaline Phosphatase: 66 U/L (ref 39–117)
BUN: 14 mg/dL (ref 6–23)
CO2: 20 mEq/L (ref 19–32)
Calcium: 8 mg/dL — ABNORMAL LOW (ref 8.4–10.5)
Chloride: 102 mEq/L (ref 96–112)
Creatinine, Ser: 1 mg/dL (ref 0.50–1.35)
GFR calc Af Amer: >90 mL/min (ref >90)
GFR calc non Af Amer: 80 mL/min — ABNORMAL LOW (ref >90)
Glucose, Bld: 82 mg/dL (ref 70–99)
Potassium: 4.6 mEq/L (ref 3.7–5.3)
Sodium: 131 mEq/L — ABNORMAL LOW (ref 137–147)
Total Bilirubin: 2.3 mg/dL — ABNORMAL HIGH (ref 0.3–1.2)
Total Protein: 4.8 g/dL — ABNORMAL LOW (ref 6.0–8.3)

## 2014-04-25 LAB — CBC
HCT: 21.6 % — ABNORMAL LOW (ref 39.0–52.0)
Hemoglobin: 7.7 g/dL — ABNORMAL LOW (ref 13.0–17.0)
MCH: 33.2 pg (ref 26.0–34.0)
MCHC: 35.6 g/dL (ref 30.0–36.0)
MCV: 93.1 fL (ref 78.0–100.0)
Platelets: 26 10*3/uL — CL (ref 150–400)
RBC: 2.32 MIL/uL — ABNORMAL LOW (ref 4.22–5.81)
RDW: 15.6 % — ABNORMAL HIGH (ref 11.5–15.5)
WBC: 1.8 10*3/uL — ABNORMAL LOW (ref 4.0–10.5)

## 2014-04-25 LAB — HCV RNA QUANT
HCV Quantitative Log: 5.49 {Log} — ABNORMAL HIGH (ref <1.18)
HCV Quantitative: 308556 IU/mL — ABNORMAL HIGH (ref <15)

## 2014-04-25 MED ORDER — ONDANSETRON 8 MG/NS 50 ML IVPB
8.0000 mg | Freq: Once | INTRAVENOUS | Status: DC
  Filled 2014-04-25: qty 8

## 2014-04-25 MED ORDER — GUAIFENESIN-DM 100-10 MG/5ML PO SYRP
5.0000 mL | ORAL_SOLUTION | ORAL | Status: DC | PRN
  Administered 2014-04-25: 5 mL via ORAL
  Filled 2014-04-25: qty 10

## 2014-04-25 MED ORDER — GADOBENATE DIMEGLUMINE 529 MG/ML IV SOLN
20.0000 mL | Freq: Once | INTRAVENOUS | Status: AC | PRN
  Administered 2014-04-25: 17 mL via INTRAVENOUS

## 2014-04-25 NOTE — Progress Notes (Signed)
MRI reviewed with Dr. Barbie Banner. Agree that given this clinical history and associated elevated AFP, hepatic lesion is highly suspicious of hepatocellular cancer. Thrombocytopenia and presence of ascites present significant risk for biopsy. Favor presumptive diagnosis of [place]. Recommend both GI/Hepatology eval and surgical eval for resectability before considering high risk biopsy or other treatment options such as thermal ablation, transcatheter radioembolization or chemoembolization.  Ascencion Dike PA-C Interventional Radiology 04/25/2014 12:41 PM

## 2014-04-25 NOTE — Progress Notes (Signed)
Patient had noted critical lab of platelet 26 on this morning reported to floor provider via [email or web address]

## 2014-04-25 NOTE — Consult Note (Signed)
[place]  Telephone:([phone]) [place] CONSULTATION NOTE  Jeffrey Snow                                Jeffrey#: [id number]  DOB: Aug 24, 1956                       CSN#: [id number]  Referring MD: [facility]    Primary MD: .[name],[name], MD    Reason for Consult: Liver Cancer   YQM:VHQIONG E Slowey is a 58 y.o. [place] man asked to see for evaluation of possible hepatocellular carcinoma. He was admitted on 5/7 with severe lower extremity rash and cellulitis in the setting of claudication, treated by vascular team. He had repeated episodes of falls due to his lower extremity pain. He had no other organic complaints at the time of presentation  Labs included  AFP which was elevated at 75. Patient has a known right hepatic lobe abnormality on imaging raises concern for hepatocellular carcinoma dating back to 01/2013. A new MRI abdomen on 04/25/14 showed a  3.2 cm enhancing lesion in the posterior segment right hepatic lobe, increased, highly suspicious for hepatocellular carcinoma.  Cirrhosis with splenomegaly and small volume abdominal ascites was noted as well.  He has multiple medical problems including a prior history of alcohol abuse and cirrhosis, possible Hep C (awaiting viral load), Sickle Cell disease, peripheral vascular disease  among other medical issues listed below. He had pancytopenia with significant drop of his platelet count (baseline as of 2013 was 115). DIC panel negative for schistocytes. HIV nonreactive. Follow up hepatitis C viral load is pending. Cryoglobulins pending. ESR normal. No transfusion was received to date during this admission. We were kindly asked to see the patient due to the abnormal abdominal MRI.          PMH:  Past Medical History  Diagnosis Date  . Depression   . Anxiety   . Panic attack   . Hypertension   . Hepatitis C   . Claudication of left lower extremity m-1  . Abnormal MRI of abdomen,  liver  04/21/2014  . Cirrhosis, alcoholic 01/24/5283    Surgeries:  Past Surgical History  Procedure Laterality Date  . Cosmetic surgery      facial reconstructive surgery  . Aortogram      Allergies: No Known Allergies  Medications:   Scheduled Meds: . ALPRAZolam  0.5 mg Oral TID  . aspirin  81 mg Oral Daily  . benazepril  40 mg Oral Daily  . FLUoxetine  20 mg Oral Daily  . folic acid  1 mg Oral Daily  . hydrocerin   Topical BID  . hydrochlorothiazide  25 mg Oral Daily  . thiamine  100 mg Oral Daily  . vancomycin  1,000 mg Intravenous Q12H  . zolpidem  10 mg Oral QHS   Continuous Infusions: . 0.9 % NaCl with KCl 20 mEq / L 75 mL/hr at 04/25/14 1305   PRN Meds:.acetaminophen, acetaminophen, alum & mag hydroxide-simeth, guaiFENesin-dextromethorphan, HYDROcodone-acetaminophen, hydrocortisone, ondansetron (ZOFRAN) IV, ondansetron  ROS: very limited. Patient is not cooperative Constitutional: Denies fevers, chills or abnormal night sweats Eyes: Denies blurriness of vision, double vision or watery eyes Ears, nose, mouth, throat, and face: Denies mucositis or sore throat Respiratory: Denies cough, dyspnea or wheezes Cardiovascular: Denies palpitation, chest discomfort or lower extremity swelling Gastrointestinal:  Denies nausea, heartburn or  change in bowel habits Skin: skin rash with petechiae on admission due to cellulitis and thrombocytopenia, now improving Lymphatics: Denies new lymphadenopathy or easy bruising Neurological: Denies numbness, tingling or new weaknesses Musculoskeletal: no sickle cell crisis in many years Behavioral/Psych: Somewhat more irritable this afternoon  All other systems were reviewed with the patient and are negative.e   Family History:    Family History  Problem Relation Age of Onset  . Diabetes Father   . Diabetes Brother   . Diabetes Brother     No family history of hematological  disorders.  Social History:  reports that he quit  smoking about 2 years ago. He has never used smokeless tobacco. He reports that he drinks about 3.6 ounces of alcohol per week. He reports that he does not use illicit drugs. Unable to obtain further details, he is not cooperative today.  Physical Exam    Filed Vitals:   04/25/14 0500  BP: 127/57  Pulse: 81  Temp: 98.3 F (36.8 C)  Resp: 20   Filed Weights   04/21/14 1830  Weight: 180 lb (81.647 kg)    GENERAL:   58 year old white male, no distress and comfortable, but lethargic. Falls back asleep, gets easily irritable when asked questions.  SKIN: skin color, texture, turgor are normal, erythema improving on the right leg. Bilateral lower extremity purpura present EYES: normal, conjunctiva are pink and non-injected, sclera clear OROPHARYNX:no exudate, no erythema and lips, buccal mucosa, and tongue normal  NECK: supple, thyroid normal size, non-tender, without nodularity LYMPH:  no palpable lymphadenopathy in the cervical, axillary or inguinal LUNGS: clear to auscultation and percussion with normal breathing effort HEART: regular rate & rhythm and no murmurs and no lower extremity edema ABDOMEN:abdomen soft, tender at the right upper quadrant, cannot palpate spleen.Normal bowel sounds Musculoskeletal:no cyanosis of digits and no clubbing  PSYCH: speech is not clear at this time, new. NEURO:follows simple commands but need to be repeated for him to do so.   Labs:  CBC   Recent Labs Lab 04/21/14 1159 04/21/14 1700 04/22/14 0951 04/24/14 0757 04/25/14 0453  WBC 4.4  --  4.8 2.2* 1.8*  HGB 9.5*  --  9.8* 8.8* 7.7*  HCT 26.5*  --  27.4* 24.9* 21.6*  PLT 29* 28* 36* 32* 26*  MCV 92.7  --  92.9 93.3 93.1  MCH 33.2  --  33.2 33.0 33.2  MCHC 35.8  --  35.8 35.3 35.6  RDW 14.8  --  15.3 15.7* 15.6*  LYMPHSABS 0.3*  --   --   --   --   MONOABS 0.4  --   --   --   --   EOSABS 0.0  --   --   --   --   BASOSABS 0.0  --   --   --   --      CMP    Recent Labs Lab  04/21/14 1159 04/22/14 0530 04/23/14 0533 04/24/14 0757 04/25/14 0453  NA 127* 130* 132* 132* 131*  K 3.6* 3.8 4.3 4.5 4.6  CL 89* 94* 100 100 102  CO2 15* _0 GLUCOSE 135* 84 81 70 82  BUN _1 CREATININE 1.29 1.28 1.19 1.16 1.00  CALCIUM 8.6 8.3* 8.1* 8.0* 8.0*  AST 48*  --  58* 50* 54*  ALT 30  --  _2 ALKPHOS 69  --  69 65 66  BILITOT 4.1*  --  3.1* 2.4* 2.3*        Component Value Date/Time   BILITOT 2.3* 04/25/2014 0453   BILITOT 1.08 10/12/2012 1331      Recent Labs Lab May 08, 2014 1700 04/22/14 0951  INR 1.89* 1.77*    No results found for this basename: DDIMER,  in the last 72 hours   Anemia panel:   ferritin of 671, folate 9.7, B12 877, iron 65, TIBC 165, 39% saturation.   Imaging Studies:  Dg Tibia/fibula Right  05/08/2014   CLINICAL DATA:  Fall.  EXAM: RIGHT TIBIA AND FIBULA - 2 VIEW  COMPARISON:  None.  FINDINGS: No acute bony or joint abnormality identified. No fracture or dislocation. Peripheral vascular calcification present.  IMPRESSION: No acute bony or joint abnormality.   Electronically Signed   By: Marcello Moores  Register   On: 05/08/14 13:08   Jeffrey Abdomen W Wo Contrast  04/25/2014   CLINICAL DATA:  Follow-up liver lesion, elevated AFP  EXAM: MRI ABDOMEN WITHOUT AND WITH CONTRAST  TECHNIQUE: Multiplanar multisequence Jeffrey imaging of the abdomen was performed both before and after the administration of intravenous contrast.  CONTRAST:  102m MULTIHANCE GADOBENATE DIMEGLUMINE 529 MG/ML IV SOLN  COMPARISON:  01/30/2013  FINDINGS: Motion degraded images.  Morphologic findings of cirrhosis.  No hepatic steatosis.  2.7 x 3.2 x 2.4 cm T2 hyperintense lesion with early arterial enhancement in the posterior segment right hepatic lobe (series 1101/ image 43), previously 1.6 cm. No definite washout on delayed phase images (series 13/ image 37). Lesion is conspicuous on T1 in-phase imaging (series 7/ image 42) with associated signal loss on  opposed phase, suggesting intracellular lipid. While this lesion does not meet AASLD criteria for imaging diagnosis of hepatocellular carcinoma due to lack of portal venous washout, an enlarging enhancing lesion in the presence of cirrhosis is highly suspicious for hepatocellular carcinoma.  Splenomegaly, measuring 16.8 cm and craniocaudal dimension.  Small volume perihepatic and perisplenic ascites.  Portal vein is patent.  Mild gallbladder wall thickening/edema, likely secondary, although with layering small gallstones (series 10/image 28). No intrahepatic or extrahepatic ductal dilatation.  Small upper abdominal lymph nodes measuring up to 10 mm short axis (series 1102/image 49), likely reactive.  Trace left pleural effusion.  No focal osseous lesions.  IMPRESSION: 3.2 cm enhancing lesion in the posterior segment right hepatic lobe, increased, highly suspicious for hepatocellular carcinoma.  Cirrhosis with splenomegaly and small volume abdominal ascites. Portal vein is patent.  Cholelithiasis with suspected secondary gallbladder wall thickening/edema. If there is clinical concern for cholecystitis, consider hepatobiliary nuclear medicine scan.   Electronically Signed   By: SJulian HyM.D.   On: 04/25/2014 08:16    MRI ABDOMEN WITH AND WITHOUT CONTRAST  Technique: Multiplanar multisequence Jeffrey imaging of the abdomen was  performed both before and after administration of intravenous  contrast.  Contrast: 168mMULTIHANCE GADOBENATE DIMEGLUMINE 529 MG/ML IV SOLN  Comparison: MRI dated 01/06/2013 next the  Findings: There is a mildly T2 hyperintense structure within the right hepatic lobe measuring 1.3 x 1.6 cm. There is hyperenhancement of this structure on the arterial phase, postcontrast images. There is persistent of contrast enhancement on the 5-minute delayed images.  The liver has a nodular contour which is suggestive of cirrhosis. Sludge is identified within the gallbladder. No gallbladder wall  thickening. No biliary dilatation. Normal appearance of the pancreas. The spleen is mildly enlarged measuring 13.3 cm. Normal appearance of both adrenal glands the kidneys are both unremarkable. There is no upper abdominal adenopathy  identified. No ascites noted. Review of the visualized osseous structures is unremarkable.  IMPRESSION:  1. There are morphologic features of the liver suggestive of mild cirrhosis.  2. Mildly T2 hyperintense structure within the right hepatic lobe corresponds to the ultrasound abnormality. This shows arterial hyperenhancement which persists on the delayed images. Differential considerations for this abnormality includes focal nodular hyperplasia, hepatic adenoma or hepatocellular carcinoma. Less likely is a flash fill hemangioma. Consider further evaluation with percutaneous biopsy. Additionally, it may be helpful to obtain an AFP level which may be elevated in hepatic cellular carcinoma  3. Mild splenomegaly.      A/P: 58 y.o. [place]  male asked to see for evaluation of :  1. Liver mass, suspected Hepatocellular Carcinoma Biopsy of the mass cannot be obtained at this time due to low platelet count.  GI consultation is pending to determine risk for biopsy. Per IR recommendations, other options including thermal ablation, transcatheter radioembolization or chemoembolization are being entertained.   2. Thrombocytopenia secondary to #1 He has liver disease and chronic thrombocytopenia, no acute bleeding at this time.  DIC panel negative for schistocytes. HIV is negative. Risk factors to interfere with improvement of count include prior ETOH abuse, possible Hep C cardiac disease with T wave inversion in EKG / Elevated troponins, infection, IV antibiotics and malignancy. He also has ascites. Continue to abstain from Heparin or Heparin products. No SCDs at this time due to claudication issues.   3. Normocytic Anemia/Chronic disease/History of Sickle cell Continue to  monitor. May ned transfusion if Hb less than 7.   4. Leukopenia Multifactorial, including infection with temporary  bone marrow suppression and liver disease. Will check smear to rule out other etiologies.  4. Full Code   **Disclaimer: This note was dictated with voice recognition software. Similar sounding words can inadvertently be transcribed and this note may contain transcription errors which may not have been corrected upon publication of note.Rondel Jumbo, PA-C 04/25/2014 1:20 PM   ADDENDUM: 58 y.o. [place] man with a Right liver lesion first documented January 2014, measuring 1.6 cm by abdminal MRI 01/30/2013, now measuring 3.2 cm (03/26/2014), in the setting of cirrhosis and hepatitis C positivity, with and AFP of 75 (04/21/2014)  The pretest probability of this being hepatocellular carcinoma is sufficiently high I am not uncomfortable with forgoing biopsy and proceeding to treatment if possible. Optimal treatment would be resection, and I will place a surgical referral. If surgery not felt to be feasible or advisable would reconsult IR for consideration of embolization or other ablative procedures.  Biopsy, surgery or other intervention will have to wait for a rise in the platelet count. Note Dr Worthy Flank consult from 10/12/2012 for thrombocytopenia--he felt most likely it was due to ETOH effect on the marrow plus possibly milt ITP. The patient's platelet count rose to >100K w/o any treatment other than abstinence. I believe the same is likely the case now, with the addition of splenomegaly, which falsely depresses the peripheral platelet count. (Note the absence of schistocytes, which makes TTP or DIC very unlikely; doubt HIT but will request screen to complete workup).  The patient tells me he has three children and 16 grandchildren but sees them only on facebook. He had one brother who committed suicide and another brother who lives nearby. I wonder what resources may be  available to help Jeffrey Snow at home. Though he is certainly not medically incompetent, I am not sure to what extent he is able to direct his  own care. Does he qualify for long-term residential care?  Will follow with you.   I personally saw this patient and performed a substantive portion of this encounter with the listed APP documented above.   Chauncey Cruel, MD

## 2014-04-25 NOTE — Progress Notes (Signed)
Progress Note   JAYLN MADEIRA WGN:[id number] DOB: 08-13-1956 DOA: 04/21/2014 PCP: [name],[name], MD   Brief Narrative:   FARZAD TIBBETTS is an 58 y.o. male with a PMH of peripheral neuropathy, hypertension, peripheral arterial disease/arterial insufficiency with chronic intermittent claudication status post arteriogram which showed a right distal SFA occlusion with essentially hypertrophied collaterals from the R profunda femoral artery which constitute a bypass of the R distal SFA occlusion, L popliteal artery is occluded at the knee but numerous geniculate arteries bypass the short segment occlusion, who is under the care of Dr. Bridgett Larsson of vascular surgery who recommended conservative care of his arterial disease with maximal medical management including strict control of blood pressure, blood glucose and lipids, regular exercise, smoking cessation. Dr. Bridgett Larsson did not think the patient's anesthesia/neuropathy symptoms were from his peripheral arterial disease because of his hypertrophic collaterals.  He was admitted 04/21/14 with right leg/foot pain, weakness, and falls. He also has a worsening rash with petechiae.   Assessment/Plan:   Principal Problem:  Cellulitis RLE / Pain in limb / rash and nonspecific skin eruption in a patient with a history of peripheral artery disease/arterial insufficiency/atherosclerosis of native arteries of the extremities with intermittent claudication   Case discussed with Dr. Bridgett Larsson of vascular surgery 04/21/14 who reviewed the images above. According to Dr. Vallarie Mare, he had a rash similar to the one on his left leg at the time of the arteriogram. His right leg is worse. He does not feel this is related to the arteriogram and confirms that the patient has good collateral blood flow.   Continue aspirin. Lipids OK.   Followup cryoglobulins to rule out cryoglobulinemia. ESR WNL making vasculitis unlikely. DIC panel negative for schistocytes. HIV nonreactive.  Follow up hepatitis C viral load.  Dopplers negative for DVT(elevated d-dimer).  Thrombocytopenia and purpura likely related to liver disease.   Continue Vicodin as needed for pain control. Avoid IV narcotics.  Continue Vancomycin given recent arteriogram to cover MRSA.  Active Problems:  Pancytopenia  Patient has had a significant deterioration in his platelet count which was 115 on 10/12/12.  His WBC and hemoglobin are also low, possibly from bone marrow suppression/liver disease.  Will need heme-onc evaluation.  Spoke with Dr. Jana Hakim who will see the patient in consultation. Depression   Continue Xanax and Prozac. Fall   Physical therapy evaluation performed 04/22/14: SNF recommended, however patient has refused even though he cannot walk. HTN (hypertension)   Continue Lotensin and hydrochlorothiazide. Normocytic anemia   Anemia panel shows a ferritin of 671, folate 9.7, B12 877, iron 65, TIBC 165, 39% saturation.  Anemia likely from bone marrow suppression from ETOH and chronic disease. Cirrhosis, alcoholic / coagulopathy   Changes of mild cirrhosis noted on MRI of the abdomen done 01/06/13. Known history of heavy alcohol use in the past.   History of polysubstance abuse in the past. Urine drug screen positive for benzos (on Xanax), HIV nonreactive.   Coagulopathy likely related to cirrhosis. Hyponatremia   May be from dehydration versus beer drinkers potomania. Improving with IV fluids. Hypokalemia   Resolved with potassium added to IV fluids. Metabolic acidosis   Mild elevation of serum lactate noted. Resolved with hydration. Hepatitis C / elevated AFP tumor marker / Abnormal MRI of abdomen, liver  Followup hepatitis C viral load.  AFP elevated and known right hepatic lobe abnormality on imaging raises concern for hepatocellular carcinoma.  Repeat MRI shows lesion, originally measured 1.3x1.6 cm, now measures  2.7x3.2x2.4 cm.  IR consulted for biopsy. T  wave inversion in EKG / Elevated troponins   No complaints of chest pain. Troponins elevated, but declining. Continue aspirin.  2-D echocardiogram normal.  No hyperlipidemia.  Re-check 12 lead EKG 04/22/14 shows persistent T wave inversions in V1/V2.  Troponin now normal. DVT prophylaxis   Heparin/Lovenox contraindicated given thrombocytopenia, and with claudication, would not use SCDs.  Code Status: Full. Family Communication: No family at the bedside. Disposition Plan: Home vs. SNF when stable.   IV Access:    Peripheral IV   Procedures:    2 D Echo 04/22/14: EF 60-65%, no regional wall motion abnormalities.  Left ventricular diastolic function parameters were normal.  a   Medical Consultants:    Interventional radiology   Other Consultants:    None.   Anti-Infectives:    None.  Subjective:   Joh E Kaas is still having foot/leg pain on the right, but improved. No chest pains, no dyspnea, ongoing cough.  Objective:    Filed Vitals:   04/24/14 0634 04/24/14 1447 04/24/14 2143 04/25/14 0500  BP: 103/65 128/73 120/64 127/57  Pulse: 78 83 80 81  Temp: 97.6 F (36.4 C) 97.7 F (36.5 C) 98.5 F (36.9 C) 98.3 F (36.8 C)  TempSrc: Oral Oral Oral Oral  Resp: 18 18 18 20  Height:      Weight:      SpO2: 95% 98% 97% 98%    Intake/Output Summary (Last 24 hours) at 04/25/14 0747 Last data filed at 04/25/14 0500  Gross per 24 hour  Intake    360 ml  Output    600 ml  Net   -240 ml    Exam: Gen:  NAD Cardiovascular:  RRR, No M/R/G Respiratory:  Lungs CTAB Gastrointestinal:  Abdomen soft, NT/ND, + BS Extremities:  RLE shows some clearing of erythema, palpable purpura BLE unchanged.  See images uploaded to note in H&P and progress note 04/24/14.  Data Reviewed:    Labs: Basic Metabolic Panel:  Recent Labs Lab 04/21/14 1159 04/22/14 0530 04/23/14 0533 04/24/14 0757 04/25/14 0453  NA 127* 130* 132* 132* 131*  K 3.6* 3.8 4.3 4.5 4.6    CL 89* 94* 100 100 102  CO2 15* 22 23 21 20  GLUCOSE 135* 84 81 70 82  BUN 12 15 16 18 14  CREATININE 1.29 1.28 1.19 1.16 1.00  CALCIUM 8.6 8.3* 8.1* 8.0* 8.0*   GFR Estimated Creatinine Clearance: 81.3 ml/min (by C-G formula based on Cr of 1). Liver Function Tests:  Recent Labs Lab 04/21/14 1159 04/23/14 0533 04/24/14 0757 04/25/14 0453  AST 48* 58* 50* 54*  ALT 30 31 30 29  ALKPHOS 69 69 65 66  BILITOT 4.1* 3.1* 2.4* 2.3*  PROT 5.4* 5.2* 5.2* 4.8*  ALBUMIN 2.3* 2.3* 2.2* 2.0*   Coagulation profile  Recent Labs Lab 04/21/14 1700 04/22/14 0951  INR 1.89* 1.77*    CBC:  Recent Labs Lab 04/21/14 1159 04/21/14 1700 04/22/14 0951 04/24/14 0757 04/25/14 0453  WBC 4.4  --  4.8 2.2* 1.8*  NEUTROABS 3.7  --   --   --   --   HGB 9.5*  --  9.8* 8.8* 7.7*  HCT 26.5*  --  27.4* 24.9* 21.6*  MCV 92.7  --  92.9 93.3 93.1  PLT 29* 28* 36* 32* 26*   Cardiac Enzymes:  Recent Labs Lab 04/21/14 1700 04/21/14 2229 04/22/14 0530 04/23/14 0533  TROPONINI 0.65* 0.73* 0.44* <0.30     Sepsis Labs:  Recent Labs Lab 04/21/14 1159 04/21/14 1417 04/22/14 0951 04/23/14 0534 04/24/14 0757 04/25/14 0453  WBC 4.4  --  4.8  --  2.2* 1.8*  LATICACIDVEN  --  3.2*  --  1.2  --   --    Microbiology No results found for this or any previous visit (from the past 240 hour(s)).   Radiographs/Studies:   Dg Tibia/fibula Right  04/21/2014   CLINICAL DATA:  Fall.  EXAM: RIGHT TIBIA AND FIBULA - 2 VIEW  COMPARISON:  None.  FINDINGS: No acute bony or joint abnormality identified. No fracture or dislocation. Peripheral vascular calcification present.  IMPRESSION: No acute bony or joint abnormality.   Electronically Signed   By: Marcello Moores  Register   On: 04/21/2014 13:08   Dg Foot Complete Right  03/31/2014   CLINICAL DATA:  right foot pain  EXAM: RIGHT FOOT COMPLETE - 3+ VIEW  COMPARISON:  None.  FINDINGS: There is no evidence of fracture or dislocation. There is no evidence of  arthropathy or other focal bone abnormality. Soft tissues are unremarkable.  IMPRESSION: Negative.   Electronically Signed   By: Margaree Mackintosh M.D.   On: 03/31/2014 14:22   Mr Abdomen W Wo Contrast  04/25/2014   CLINICAL DATA:  Follow-up liver lesion, elevated AFP  EXAM: MRI ABDOMEN WITHOUT AND WITH CONTRAST  TECHNIQUE: Multiplanar multisequence MR imaging of the abdomen was performed both before and after the administration of intravenous contrast.  CONTRAST:  12m MULTIHANCE GADOBENATE DIMEGLUMINE 529 MG/ML IV SOLN  COMPARISON:  01/30/2013  FINDINGS: Motion degraded images.  Morphologic findings of cirrhosis.  No hepatic steatosis.  2.7 x 3.2 x 2.4 cm T2 hyperintense lesion with early arterial enhancement in the posterior segment right hepatic lobe (series 1101/ image 43), previously 1.6 cm. No definite washout on delayed phase images (series 13/ image 37). Lesion is conspicuous on T1 in-phase imaging (series 7/ image 42) with associated signal loss on opposed phase, suggesting intracellular lipid. While this lesion does not meet AASLD criteria for imaging diagnosis of hepatocellular carcinoma due to lack of portal venous washout, an enlarging enhancing lesion in the presence of cirrhosis is highly suspicious for hepatocellular carcinoma.  Splenomegaly, measuring 16.8 cm and craniocaudal dimension.  Small volume perihepatic and perisplenic ascites.  Portal vein is patent.  Mild gallbladder wall thickening/edema, likely secondary, although with layering small gallstones (series 10/image 28). No intrahepatic or extrahepatic ductal dilatation.  Small upper abdominal lymph nodes measuring up to 10 mm short axis (series 1102/image 49), likely reactive.  Trace left pleural effusion.  No focal osseous lesions.  IMPRESSION: 3.2 cm enhancing lesion in the posterior segment right hepatic lobe, increased, highly suspicious for hepatocellular carcinoma.  Cirrhosis with splenomegaly and small volume abdominal ascites.  Portal vein is patent.  Cholelithiasis with suspected secondary gallbladder wall thickening/edema. If there is clinical concern for cholecystitis, consider hepatobiliary nuclear medicine scan.   Electronically Signed   By: SJulian HyM.D.   On: 04/25/2014 08:16   Medications:   . ALPRAZolam  0.5 mg Oral TID  . aspirin  81 mg Oral Daily  . benazepril  40 mg Oral Daily  . FLUoxetine  20 mg Oral Daily  . folic acid  1 mg Oral Daily  . hydrocerin   Topical BID  . hydrochlorothiazide  25 mg Oral Daily  . thiamine  100 mg Oral Daily  . vancomycin  1,000 mg Intravenous Q12H  . zolpidem  10 mg Oral QHS  Continuous Infusions: . 0.9 % NaCl with KCl 20 mEq / L 75 mL/hr at 04/25/14 0618    Time spent: 35 minutes.  The patient requires high complexity decision making and coordination of care.    LOS: 4 days   Christina P Rama  [facility] Pager [phone]. If unable to reach me by pager, please call my cell phone at [phone].  *Please refer to [email or web address], password [email or web address] to get updated schedule on who will round on this patient, as hospitalists switch teams weekly. If 7PM-7AM, please contact night-coverage at [email or web address], password [email or web address] for any overnight needs.  04/25/2014, 7:47 AM    **Disclaimer: This note was dictated with voice recognition software. Similar sounding words can inadvertently be transcribed and this note may contain transcription errors which may not have been corrected upon publication of note.**           

## 2014-04-25 NOTE — Progress Notes (Signed)
ANTIBIOTIC CONSULT NOTE - Follow Up  Pharmacy Consult for Vancomycin Indication: Cellulitis  No Known Allergies  Patient Measurements: Height: 5\' 7"  (170.2 cm) Weight: 180 lb (81.647 kg) IBW/kg (Calculated) : 66.1  Vital Signs: Temp: 98.3 F (36.8 C) (05/11 0500) Temp src: Oral (05/11 0500) BP: 127/57 mmHg (05/11 0500) Pulse Rate: 81 (05/11 0500) Intake/Output from previous day: 05/10 0701 - 05/11 0700 In: 360 [P.O.:360] Out: 600 [Urine:600] Intake/Output from this shift:    Labs:  Recent Labs  04/22/14 0951 04/23/14 0533 04/24/14 0757 04/25/14 0453  WBC 4.8  --  2.2* 1.8*  HGB 9.8*  --  8.8* 7.7*  PLT 36*  --  32* 26*  CREATININE  --  1.19 1.16 1.00   Estimated Creatinine Clearance: 81.3 ml/min (by C-G formula based on Cr of 1). No results found for this basename: VANCOTROUGH, VANCOPEAK, VANCORANDOM, GENTTROUGH, GENTPEAK, GENTRANDOM, TOBRATROUGH, TOBRAPEAK, TOBRARND, AMIKACINPEAK, AMIKACINTROU, AMIKACIN,  in the last 72 hours   Microbiology: No results found for this or any previous visit (from the past 720 hour(s)).  Medical History: Past Medical History  Diagnosis Date  . Depression   . Anxiety   . Panic attack   . Hypertension   . Hepatitis C   . Claudication of left lower extremity m-1  . Abnormal MRI of abdomen, liver  04/21/2014  . Cirrhosis, alcoholic 04/21/4331    Assessment: 41 yoM with alcoholic cirrhosis and severe PVOD of bilateral LEs.  Patient has been on Keflex for possible cellulitis.  Patient has rash and nonspecific skin eruption on LLE.  Today, MD changing Keflex to Vancomycin given recent arteriogram to cover MRSA.  5/8 >> Keflex >> 5/9 5/9 >> Vancomycin >>  Tmax24h: afebrile WBC: 1.8, falling Renal: SCr 1.00, improved. Est CrCl 81 ml/min No micro data.  Goal of Therapy:  Vancomycin trough level 10-15 mcg/ml  Plan:  1.  Continue Vancomycin 1g IV q12h. 2.  Check vancomycin trough prior to tonight's dose which will be the 6th  dose   Adrian Saran, PharmD, BCPS Pager [phone] 04/25/2014 8:52 AM

## 2014-04-25 NOTE — Progress Notes (Signed)
PT Cancellation Note  Patient Details Name: Jeffrey Snow MRN: [id number] DOB: 03/01/1956   Cancelled Treatment:    Reason Eval/Treat Not Completed: pt declined to participate at this time. Offered to  check back this afternoon and pt still declined to participate. Requested PT check back on tomorrow.    Weston Anna, MPT  Pager: [phone]

## 2014-04-26 LAB — CBC WITH DIFFERENTIAL/PLATELET
Basophils Absolute: 0 10*3/uL (ref 0.0–0.1)
Basophils Relative: 1 % (ref 0–1)
Eosinophils Absolute: 0.1 10*3/uL (ref 0.0–0.7)
Eosinophils Relative: 4 % (ref 0–5)
HCT: 25.1 % — ABNORMAL LOW (ref 39.0–52.0)
Hemoglobin: 8.7 g/dL — ABNORMAL LOW (ref 13.0–17.0)
Lymphocytes Relative: 32 % (ref 12–46)
Lymphs Abs: 0.7 10*3/uL (ref 0.7–4.0)
MCH: 33.3 pg (ref 26.0–34.0)
MCHC: 34.7 g/dL (ref 30.0–36.0)
MCV: 96.2 fL (ref 78.0–100.0)
Monocytes Absolute: 0.3 10*3/uL (ref 0.1–1.0)
Monocytes Relative: 16 % — ABNORMAL HIGH (ref 3–12)
Neutro Abs: 1 10*3/uL — ABNORMAL LOW (ref 1.7–7.7)
Neutrophils Relative %: 48 % (ref 43–77)
Platelets: 40 10*3/uL — ABNORMAL LOW (ref 150–400)
RBC: 2.61 MIL/uL — ABNORMAL LOW (ref 4.22–5.81)
RDW: 16.1 % — ABNORMAL HIGH (ref 11.5–15.5)
WBC: 2.2 10*3/uL — ABNORMAL LOW (ref 4.0–10.5)

## 2014-04-26 LAB — COMPREHENSIVE METABOLIC PANEL
ALT: 30 U/L (ref 0–53)
AST: 59 U/L — ABNORMAL HIGH (ref 0–37)
Albumin: 2.1 g/dL — ABNORMAL LOW (ref 3.5–5.2)
Alkaline Phosphatase: 59 U/L (ref 39–117)
BUN: 13 mg/dL (ref 6–23)
CO2: 19 mEq/L (ref 19–32)
Calcium: 8 mg/dL — ABNORMAL LOW (ref 8.4–10.5)
Chloride: 101 mEq/L (ref 96–112)
Creatinine, Ser: 0.89 mg/dL (ref 0.50–1.35)
GFR calc Af Amer: >90 mL/min (ref >90)
GFR calc non Af Amer: >90 mL/min (ref >90)
Glucose, Bld: 82 mg/dL (ref 70–99)
Potassium: 5 mEq/L (ref 3.7–5.3)
Sodium: 132 mEq/L — ABNORMAL LOW (ref 137–147)
Total Bilirubin: 2.5 mg/dL — ABNORMAL HIGH (ref 0.3–1.2)
Total Protein: 5.1 g/dL — ABNORMAL LOW (ref 6.0–8.3)

## 2014-04-26 LAB — VANCOMYCIN, TROUGH: Vancomycin Tr: 19.3 ug/mL (ref 10.0–20.0)

## 2014-04-26 MED ORDER — VANCOMYCIN HCL IN DEXTROSE 1-5 GM/200ML-% IV SOLN
1000.0000 mg | INTRAVENOUS | Status: DC
  Administered 2014-04-27: 1000 mg via INTRAVENOUS
  Filled 2014-04-26 (×2): qty 200

## 2014-04-26 MED ORDER — VANCOMYCIN HCL IN DEXTROSE 750-5 MG/150ML-% IV SOLN
750.0000 mg | Freq: Two times a day (BID) | INTRAVENOUS | Status: DC
  Filled 2014-04-26: qty 150

## 2014-04-26 NOTE — Progress Notes (Signed)
Pharmacy Consult Note - Vancomycin Follow Up  Labs and vitals: Scr 0.89, WBC 2.2 and afebrile  A/P: Given vancomycin trough goal of 10-15 mcg/mL and trough of 19.3 last night, will change vancomycin to 1g q24 and recheck trough when necessary  Adrian Saran, PharmD, BCPS Pager [phone] 04/26/2014 8:28 AM

## 2014-04-26 NOTE — Progress Notes (Signed)
ANTIBIOTIC CONSULT NOTE - Follow Up  Pharmacy Consult for Vancomycin Indication: Cellulitis  No Known Allergies  Patient Measurements: Height: 5\' 7"  (170.2 cm) Weight: 180 lb (81.647 kg) IBW/kg (Calculated) : 66.1  Vital Signs: Temp: 97.4 F (36.3 C) (05/11 2153) Temp src: Oral (05/11 2153) BP: 128/71 mmHg (05/11 2153) Pulse Rate: 68 (05/11 2153) Intake/Output from previous day: 05/11 0701 - 05/12 0700 In: -  Out: 550 [Urine:550] Intake/Output from this shift: Total I/O In: -  Out: 250 [Urine:250]  Labs:  Recent Labs  04/23/14 0533 04/24/14 0757 04/25/14 0453  WBC  --  2.2* 1.8*  HGB  --  8.8* 7.7*  PLT  --  32* 26*  CREATININE 1.19 1.16 1.00   Estimated Creatinine Clearance: 81.3 ml/min (by C-G formula based on Cr of 1).  Recent Labs  04/25/14 2340  VANCOTROUGH 19.3     Microbiology: No results found for this or any previous visit (from the past 720 hour(s)).  Medical History: Past Medical History  Diagnosis Date  . Depression   . Anxiety   . Panic attack   . Hypertension   . Hepatitis C   . Claudication of left lower extremity m-1  . Abnormal MRI of abdomen, liver  04/21/2014  . Cirrhosis, alcoholic 05/21/2946    Assessment: 15 yoM with alcoholic cirrhosis and severe PVOD of bilateral LEs.  Patient has been on Keflex for possible cellulitis.  Patient has rash and nonspecific skin eruption on LLE.  5/9, MD changed Keflex to Vancomycin given recent arteriogram to cover MRSA.  VT=19.3 @ Css. Above desired range.   5/8 >> Keflex >> 5/9 5/9 >> Vancomycin >>  Tmax24h: afebrile WBC: 1.8, falling Renal: SCr 1.00, improved. Est CrCl 81 ml/min   Goal of Therapy:  Vancomycin trough level 10-15 mcg/ml  Plan:  1.  Decrease to Vancomycin 750mg  IV q12h. 2.  Check additional levels/SCr as needed   Dorrene German 04/26/2014 2:06 AM

## 2014-04-26 NOTE — Progress Notes (Signed)
Physical Therapy Treatment Patient Details Name: Jeffrey Snow MRN: [id number] DOB: Dec 06, 1956 Today's Date: 04/26/2014    History of Present Illness 58 yo male adm after multiple falls at homw and LE pain/rash; PMH of peripheral neuropathy, hypertension, peripheral arterial disease/arterial insufficiency with chronic intermittent claudication, dopplers negative    PT Comments    Pt required MAX encouragement to participate and required + 2 total assit to amb short distance to BR.  Pt c/o R LE pain however was able to fully WB and was non specific on location and type of pain.  Pt did amb better from BR to hallway but still very unsteady/shaky/poor safety cognition/HIGH FALL RISK.  Follow Up Recommendations  SNF (pt refusung)     Equipment Recommendations  None recommended by PT    Recommendations for Other Services       Precautions / Restrictions Precautions Precautions: Fall Restrictions Weight Bearing Restrictions: No    Mobility  Bed Mobility Overal bed mobility: Needs Assistance Bed Mobility: Supine to Sit     Supine to sit: Min guard;Supervision     General bed mobility comments: increased time and MAX VC's to increase self assist and motivate to participate  Transfers Overall transfer level: Needs assistance Equipment used: Rolling walker (2 wheeled) Transfers: Sit to/from Stand Sit to Stand: +2 physical assistance;+2 safety/equipment;Max assist         General transfer comment: MAX encouragement to participate and increased time to complete as pt repeated "I can't" c/o R LE pain and demon increased self limiting behavior.    Ambulation/Gait Ambulation/Gait assistance: +2 physical assistance;Max assist Ambulation Distance (Feet): 28 Feet (8 feet to BR then another 20 feet in hallway.) Assistive device: Rolling walker (2 wheeled) Gait Pattern/deviations: Step-to pattern;Decreased stance time - right Gait velocity: decr   General Gait Details: Very  resistant initially requiring MAX encouragement tp participate.  + 2 total assist to amb short distance to BR.  Second walk was better + 2 Mod/Min assist with less resistance.  Pt c/o R LE pain however was able to fully WB and non specific about what kind of pain.     Stairs            Wheelchair Mobility    Modified Rankin (Stroke Patients Only)       Balance                                    Cognition                            Exercises      General Comments        Pertinent Vitals/Pain     Home Living                      Prior Function            PT Goals (current goals can now be found in the care plan section) Progress towards PT goals: Progressing toward goals    Frequency  Min 3X/week    PT Plan      Co-evaluation             End of Session Equipment Utilized During Treatment: Gait belt Activity Tolerance: Other (comment) (pt self limiting ) Patient left: in chair     Time: 6387-5643 PT Time Calculation (min): 38 min  Charges:  $Gait Training: 23-37 mins $Therapeutic Activity: 8-22 mins                    G Codes:      Rica Koyanagi  PTA [facility]  Acute  Rehab Pager      [phone]

## 2014-04-26 NOTE — Progress Notes (Signed)
Clinical Social Work  Dr. Zigmund Daniel visited patient in the hospital and spoke with CSW after meeting. MD is concerned about patient returning home at DC due to weakness. CSW met with patient who continues to refuse SNF placement. Patient reports he waited 3 years to get into his apartment and will not give up such a great apartment. Patient reports he does not have enough money for SNF and apartment so he would prefer to return home. Patient agreeable for CSW to talk with brother. Brother reports that he cannot help patient financially but that if patient wants to return home then he and other family could assist some at home. Brother reports that he feels that patient is still drinking alcohol even though he is denying use to medical staff. Brother reports that patient can make his own decisions but thanked CSW for call.  [place], [place] [phone]

## 2014-04-26 NOTE — Progress Notes (Addendum)
Progress Note   Jeffrey Snow:[id number] DOB: Oct 31, 1956 DOA: 04/21/2014 PCP: [name],[name], MD   Brief Narrative:   Jeffrey Snow is an 58 y.o. male with a PMH of peripheral neuropathy, hypertension, peripheral arterial disease/arterial insufficiency with chronic intermittent claudication status post arteriogram which showed a right distal SFA occlusion with essentially hypertrophied collaterals from the R profunda femoral artery which constitute a bypass of the R distal SFA occlusion, L popliteal artery is occluded at the knee but numerous geniculate arteries bypass the short segment occlusion, who is under the care of Dr. Bridgett Larsson of vascular surgery who recommended conservative care of his arterial disease with maximal medical management including strict control of blood pressure, blood glucose and lipids, regular exercise, smoking cessation. Dr. Bridgett Larsson did not think the patient's anesthesia/neuropathy symptoms were from his peripheral arterial disease because of his hypertrophic collaterals.  He was admitted 04/21/14 with right leg/foot pain, weakness, and falls. He also has a worsening rash with petechiae.  Plan was initially to obtain biopsy of his liver lesion while in the hospital, but IR felt he was high-risk for this given his thrombocytopenia, and recommended oncology consultation. The patient was seen by Dr. Jana Hakim feels that his presentation is likely consistent with hepatocellular carcinoma, and has placed a surgical consultation.  Assessment/Plan:   Principal Problem:  Cellulitis RLE / Pain in limb / rash and nonspecific skin eruption in a patient with a history of peripheral artery disease/arterial insufficiency/atherosclerosis of native arteries of the extremities with intermittent claudication   Case discussed with Dr. Bridgett Larsson of vascular surgery 04/21/14 who reviewed the images above. According to Dr. Vallarie Mare, he had a rash similar to the one on his left leg at the time  of the arteriogram. His right leg is worse. He does not feel this is related to the arteriogram and confirms that the patient has good collateral blood flow.   Continue aspirin. Lipids OK.   Followup cryoglobulins to rule out cryoglobulinemia. ESR WNL making vasculitis unlikely. DIC panel negative for schistocytes. HIV nonreactive. Hepatitis C viral load with a log of 5.49.  Dopplers negative for DVT(elevated d-dimer).  The patient has bilateral LE purpura, with superimposed cellulitis on the right, which is being managed with vancomycin.   Continue Vicodin as needed for pain control. Avoid IV narcotics. Active Problems:  Pancytopenia  Patient has had a significant deterioration in his platelet count which was 115 on 10/12/12.  His WBC and hemoglobin are also low, possibly from bone marrow suppression/liver disease.  Seen by Dr. Jana Hakim 04/25/14 who also feels bone marrow suppression from alcohol use likely etiology.  Counts slowly beginning to improve with abstinence. Depression   Continue Xanax and Prozac. Fall   Physical therapy evaluation performed 04/22/14: SNF recommended, however patient has refused even though he cannot walk. HTN (hypertension)   Continue Lotensin and hydrochlorothiazide. Normocytic anemia   Anemia panel shows a ferritin of 671, folate 9.7, B12 877, iron 65, TIBC 165, 39% saturation.  Anemia likely from bone marrow suppression from ETOH and chronic disease. Cirrhosis, alcoholic / coagulopathy   Changes of mild cirrhosis noted on MRI of the abdomen done 01/06/13. Known history of heavy alcohol use in the past.   History of polysubstance abuse in the past. Urine drug screen positive for benzos (on Xanax), HIV nonreactive.   Coagulopathy likely related to cirrhosis. Hyponatremia   May be from dehydration versus beer drinkers potomania. Improving with IV fluids. Hypokalemia   Resolved with potassium  added to IV fluids. Metabolic acidosis   Mild  elevation of serum lactate noted. Resolved with hydration. Hepatitis C / elevated AFP tumor marker / Abnormal MRI of abdomen, liver  AFP elevated and known right hepatic lobe abnormality on imaging raises concern for hepatocellular carcinoma.  Repeat MRI shows lesion, originally measured 1.3x1.6 cm, now measures 2.7x3.2x2.4 cm.  Dr. Jana Hakim following, surgical consultation called per Dr. Jana Hakim. T wave inversion in EKG / Elevated troponins   No complaints of chest pain. Troponins elevated, but declining. Continue aspirin.  2-D echocardiogram normal.  No hyperlipidemia.  Re-check 12 lead EKG 04/22/14 shows persistent T wave inversions in V1/V2.  Troponin now normal. DVT prophylaxis   Heparin/Lovenox contraindicated given thrombocytopenia, and with claudication, would not use SCDs.  Code Status: Full. Family Communication: No family at the bedside. Disposition Plan: Home vs. SNF when stable.   IV Access:    Peripheral IV   Procedures:    2 D Echo 04/22/14: EF 60-65%, no regional wall motion abnormalities.  Left ventricular diastolic function parameters were normal.   Medical Consultants:    Interventional radiology  Dr. Lurline Del, Oncology.   Other Consultants:    None.   Anti-Infectives:    None.  Subjective:   Jeffrey Snow is still having foot/leg pain on the right, but improved. No chest pains, no dyspnea, ongoing cough. He still is unable to ambulate.  Objective:    Filed Vitals:   04/25/14 0500 04/25/14 1333 04/25/14 2153 04/26/14 0545  BP: 127/57 122/65 128/71 130/78  Pulse: 81 89 68 68  Temp: 98.3 F (36.8 C) 98.6 F (37 C) 97.4 F (36.3 C) 97.3 F (36.3 C)  TempSrc: Oral Oral Oral Oral  Resp: '20 18 18 18  ' Height:      Weight:      SpO2: 98% 99% 98% 99%    Intake/Output Summary (Last 24 hours) at 04/26/14 0758 Last data filed at 04/26/14 0600  Gross per 24 hour  Intake 5441.25 ml  Output    750 ml  Net 4691.25 ml     Exam: Gen:  NAD Cardiovascular:  RRR, No M/R/G Respiratory:  Lungs CTAB Gastrointestinal:  Abdomen soft, NT/ND, + BS Extremities:  RLE shows some clearing of erythema, palpable purpura BLE unchanged.  See images uploaded to note in H&P and progress note 04/24/14. No significant changes from 04/24/14.  Data Reviewed:    Labs: Basic Metabolic Panel:  Recent Labs Lab 04/22/14 0530 04/23/14 0533 04/24/14 0757 04/25/14 0453 04/26/14 0425  NA 130* 132* 132* 131* 132*  K 3.8 4.3 4.5 4.6 5.0  CL 94* 100 100 102 101  CO2 '22 23 21 20 19  ' GLUCOSE 84 81 70 82 82  BUN '15 16 18 14 13  ' CREATININE 1.28 1.19 1.16 1.00 0.89  CALCIUM 8.3* 8.1* 8.0* 8.0* 8.0*   GFR Estimated Creatinine Clearance: 91.4 ml/min (by C-G formula based on Cr of 0.89). Liver Function Tests:  Recent Labs Lab 04/21/14 1159 04/23/14 0533 04/24/14 0757 04/25/14 0453 04/26/14 0425  AST 48* 58* 50* 54* 59*  ALT '30 31 30 29 30  ' ALKPHOS 69 69 65 66 59  BILITOT 4.1* 3.1* 2.4* 2.3* 2.5*  PROT 5.4* 5.2* 5.2* 4.8* 5.1*  ALBUMIN 2.3* 2.3* 2.2* 2.0* 2.1*   Coagulation profile  Recent Labs Lab 04/21/14 1700 04/22/14 0951  INR 1.89* 1.77*    CBC:  Recent Labs Lab 04/21/14 1159 04/21/14 1700 04/22/14 0951 04/24/14 0757 04/25/14 0453 04/26/14 0425  WBC 4.4  --  4.8 2.2* 1.8* 2.2*  NEUTROABS 3.7  --   --   --   --  1.0*  HGB 9.5*  --  9.8* 8.8* 7.7* 8.7*  HCT 26.5*  --  27.4* 24.9* 21.6* 25.1*  MCV 92.7  --  92.9 93.3 93.1 96.2  PLT 29* 28* 36* 32* 26* 40*   Cardiac Enzymes:  Recent Labs Lab 04/21/14 1700 04/21/14 2229 04/22/14 0530 04/23/14 0533  TROPONINI 0.65* 0.73* 0.44* <0.30   Sepsis Labs:  Recent Labs Lab 04/21/14 1159 04/21/14 1417 04/22/14 0951 04/23/14 0534 04/24/14 0757 04/25/14 0453 04/26/14 0425  WBC 4.4  --  4.8  --  2.2* 1.8* 2.2*  LATICACIDVEN  --  3.2*  --  1.2  --   --   --    Microbiology No results found for this or any previous visit (from the past 240  hour(s)).   Radiographs/Studies:   Dg Tibia/fibula Right  04/21/2014   CLINICAL DATA:  Fall.  EXAM: RIGHT TIBIA AND FIBULA - 2 VIEW  COMPARISON:  None.  FINDINGS: No acute bony or joint abnormality identified. No fracture or dislocation. Peripheral vascular calcification present.  IMPRESSION: No acute bony or joint abnormality.   Electronically Signed   By: Marcello Moores  Register   On: 04/21/2014 13:08   Dg Foot Complete Right  03/31/2014   CLINICAL DATA:  right foot pain  EXAM: RIGHT FOOT COMPLETE - 3+ VIEW  COMPARISON:  None.  FINDINGS: There is no evidence of fracture or dislocation. There is no evidence of arthropathy or other focal bone abnormality. Soft tissues are unremarkable.  IMPRESSION: Negative.   Electronically Signed   By: Margaree Mackintosh M.D.   On: 03/31/2014 14:22   Mr Abdomen W Wo Contrast  04/25/2014   CLINICAL DATA:  Follow-up liver lesion, elevated AFP  EXAM: MRI ABDOMEN WITHOUT AND WITH CONTRAST  TECHNIQUE: Multiplanar multisequence MR imaging of the abdomen was performed both before and after the administration of intravenous contrast.  CONTRAST:  39m MULTIHANCE GADOBENATE DIMEGLUMINE 529 MG/ML IV SOLN  COMPARISON:  01/30/2013  FINDINGS: Motion degraded images.  Morphologic findings of cirrhosis.  No hepatic steatosis.  2.7 x 3.2 x 2.4 cm T2 hyperintense lesion with early arterial enhancement in the posterior segment right hepatic lobe (series 1101/ image 43), previously 1.6 cm. No definite washout on delayed phase images (series 13/ image 37). Lesion is conspicuous on T1 in-phase imaging (series 7/ image 42) with associated signal loss on opposed phase, suggesting intracellular lipid. While this lesion does not meet AASLD criteria for imaging diagnosis of hepatocellular carcinoma due to lack of portal venous washout, an enlarging enhancing lesion in the presence of cirrhosis is highly suspicious for hepatocellular carcinoma.  Splenomegaly, measuring 16.8 cm and craniocaudal dimension.   Small volume perihepatic and perisplenic ascites.  Portal vein is patent.  Mild gallbladder wall thickening/edema, likely secondary, although with layering small gallstones (series 10/image 28). No intrahepatic or extrahepatic ductal dilatation.  Small upper abdominal lymph nodes measuring up to 10 mm short axis (series 1102/image 49), likely reactive.  Trace left pleural effusion.  No focal osseous lesions.  IMPRESSION: 3.2 cm enhancing lesion in the posterior segment right hepatic lobe, increased, highly suspicious for hepatocellular carcinoma.  Cirrhosis with splenomegaly and small volume abdominal ascites. Portal vein is patent.  Cholelithiasis with suspected secondary gallbladder wall thickening/edema. If there is clinical concern for cholecystitis, consider hepatobiliary nuclear medicine scan.   Electronically Signed   By: SBertis Ruddy  [place] Pink M.D.   On: 04/25/2014 08:16   Medications:   . ALPRAZolam  0.5 mg Oral TID  . aspirin  81 mg Oral Daily  . benazepril  40 mg Oral Daily  . FLUoxetine  20 mg Oral Daily  . folic acid  1 mg Oral Daily  . hydrocerin   Topical BID  . hydrochlorothiazide  25 mg Oral Daily  . thiamine  100 mg Oral Daily  . vancomycin  750 mg Intravenous Q12H  . zolpidem  10 mg Oral QHS   Continuous Infusions: . 0.9 % NaCl with KCl 20 mEq / L 75 mL/hr at 04/25/14 1305    Time spent: 25 minutes.    LOS: 5 days   [place]  [facility] Pager [phone]. If unable to reach me by pager, please call my cell phone at [phone].  *Please refer to [email or web address], password [email or web address] to get updated schedule on who will round on this patient, as hospitalists switch teams weekly. If 7PM-7AM, please contact night-coverage at [email or web address], password [email or web address] for any overnight needs.  04/26/2014, 7:58 AM    **Disclaimer: This note was dictated with voice recognition software. Similar sounding words can inadvertently be transcribed and this note may contain transcription errors which  may not have been corrected upon publication of note.**

## 2014-04-27 DIAGNOSIS — C228 Malignant neoplasm of liver, primary, unspecified as to type: Secondary | ICD-10-CM

## 2014-04-27 DIAGNOSIS — D490 Neoplasm of unspecified behavior of digestive system: Secondary | ICD-10-CM

## 2014-04-27 DIAGNOSIS — I10 Essential (primary) hypertension: Secondary | ICD-10-CM

## 2014-04-27 DIAGNOSIS — D61818 Other pancytopenia: Secondary | ICD-10-CM

## 2014-04-27 MED ORDER — DOXYCYCLINE HYCLATE 100 MG PO TABS
100.0000 mg | ORAL_TABLET | Freq: Two times a day (BID) | ORAL | Status: AC
  Administered 2014-04-27 – 2014-04-29 (×6): 100 mg via ORAL
  Filled 2014-04-27 (×6): qty 1

## 2014-04-27 NOTE — Consult Note (Signed)
Reason for Consult: enlarging right lobe liver lesion, suspicious for [facility] Referring Physician: Dr. Neil Crouch is an 58 y.o. male.  HPI: Jeffrey Snow is a 58 yo WM with history of tobacco/alcohol abuse, hepatitis C, HTN, PVD, sickle cell disease and known mild cirrhosis, splenomegaly, and 1.3 x1.6 cm right hepatic lobe lesion since 01/2013. During this hospital admission Jeffrey Snow was also noted to have elevated troponins, pancytopenia and elevated AFP of 75. F/u MRI abdomen on 04/25/14 revealed enlargement of the right hepatic lobe lesion to 3.2 cm highly suspicious for [facility] along with changes of cirrhosis/splenomegaly/small volume ascites/cholelithiasis/GB wall thickening. Request had been received earlier for US guided biopsy of the liver lesion; however, with ascites, elevated INR and thrombocytopenia bx places Jeffrey Snow at increased risk for hemorrhage. Jeffrey Snow is not surgical candidate per Dr. Barry Dienes for resection of lesion secondary to poor functional status and underlying cirrhosis. IR has been consulted to assess for possible ablative/embolization intervention of suspected HCC.  Past Medical History  Diagnosis Date  . Depression   . Anxiety   . Panic attack   . Hypertension   . Hepatitis C   . Claudication of left lower extremity m-1  . Abnormal MRI of abdomen, liver  04/21/2014  . Cirrhosis, alcoholic 06/20/2262    Past Surgical History  Procedure Laterality Date  . Cosmetic surgery      facial reconstructive surgery  . Aortogram      Family History  Problem Relation Age of Onset  . Diabetes Father   . Diabetes Brother   . Diabetes Brother     Social History:  reports that Jeffrey Snow quit smoking about 2 years ago. Jeffrey Snow has never used smokeless tobacco. Jeffrey Snow reports that Jeffrey Snow drinks about 3.6 ounces of alcohol per week. Jeffrey Snow reports that Jeffrey Snow does not use illicit drugs.  Allergies: No Known Allergies Current facility-administered medications:acetaminophen (TYLENOL) suppository 650 mg, 650 mg,  Rectal, Q6H PRN, Venetia Maxon Rama, MD;  acetaminophen (TYLENOL) tablet 650 mg, 650 mg, Oral, Q6H PRN, Venetia Maxon Rama, MD;  ALPRAZolam Duanne Moron) tablet 0.5 mg, 0.5 mg, Oral, TID, Venetia Maxon Rama, MD, 0.5 mg at 04/27/14 1624;  alum & mag hydroxide-simeth (MAALOX/MYLANTA) 200-200-20 MG/5ML suspension 30 mL, 30 mL, Oral, Q6H PRN, Venetia Maxon Rama, MD aspirin chewable tablet 81 mg, 81 mg, Oral, Daily, Venetia Maxon Rama, MD, 81 mg at 04/27/14 1005;  benazepril (LOTENSIN) tablet 40 mg, 40 mg, Oral, Daily, Venetia Maxon Rama, MD, 40 mg at 04/27/14 1005;  doxycycline (VIBRA-TABS) tablet 100 mg, 100 mg, Oral, Q12H, Modena Jansky, MD, 100 mg at 04/27/14 1225;  FLUoxetine (PROZAC) capsule 20 mg, 20 mg, Oral, Daily, Venetia Maxon Rama, MD, 20 mg at 33/54/56 2563 folic acid (FOLVITE) tablet 1 mg, 1 mg, Oral, Daily, Christina P Rama, MD, 1 mg at 04/27/14 1005;  guaiFENesin-dextromethorphan (ROBITUSSIN DM) 100-10 MG/5ML syrup 5 mL, 5 mL, Oral, Q4H PRN, Venetia Maxon Rama, MD, 5 mL at 04/25/14 1256;  hydrocerin (EUCERIN) cream, , Topical, BID, Christina P Rama, MD;  hydrochlorothiazide (HYDRODIURIL) tablet 25 mg, 25 mg, Oral, Daily, Venetia Maxon Rama, MD, 25 mg at 04/27/14 1005 HYDROcodone-acetaminophen (NORCO/VICODIN) 5-325 MG per tablet 1-2 tablet, 1-2 tablet, Oral, Q4H PRN, Venetia Maxon Rama, MD, 2 tablet at 04/27/14 1624;  hydrocortisone (ANUSOL-HC) 2.5 % rectal cream 1 application, 1 application, Rectal, BID PRN, Venetia Maxon Rama, MD;  ondansetron (ZOFRAN) injection 4 mg, 4 mg, Intravenous, Q6H PRN, Christina P Rama, MD;  ondansetron (ZOFRAN) tablet 4 mg, 4 mg,  Oral, Q6H PRN, Venetia Maxon Rama, MD thiamine (VITAMIN B-1) tablet 100 mg, 100 mg, Oral, Daily, Venetia Maxon Rama, MD, 100 mg at 04/27/14 1005;  zolpidem (AMBIEN) tablet 10 mg, 10 mg, Oral, QHS, Venetia Maxon Rama, MD, 10 mg at 04/26/14 2153   Results for orders placed during the hospital encounter of 04/21/14 (from the past 48 hour(s))  VANCOMYCIN, TROUGH     Status:  None   Collection Time    04/25/14 11:40 PM      Result Value Ref Range   Vancomycin Tr 19.3  10.0 - 20.0 ug/mL  COMPREHENSIVE METABOLIC PANEL     Status: Abnormal   Collection Time    04/26/14  4:25 AM      Result Value Ref Range   Sodium 132 (*) 137 - 147 mEq/L   Potassium 5.0  3.7 - 5.3 mEq/L   Chloride 101  96 - 112 mEq/L   CO2 19  19 - 32 mEq/L   Glucose, Bld 82  70 - 99 mg/dL   BUN 13  6 - 23 mg/dL   Creatinine, Ser 0.89  0.50 - 1.35 mg/dL   Calcium 8.0 (*) 8.4 - 10.5 mg/dL   Total Protein 5.1 (*) 6.0 - 8.3 g/dL   Albumin 2.1 (*) 3.5 - 5.2 g/dL   AST 59 (*) 0 - 37 U/L   Comment: NO VISIBLE HEMOLYSIS     HEMOLYSIS AT THIS LEVEL MAY AFFECT RESULT   ALT 30  0 - 53 U/L   Alkaline Phosphatase 59  39 - 117 U/L   Total Bilirubin 2.5 (*) 0.3 - 1.2 mg/dL   GFR calc non Af Amer >90  >90 mL/min   GFR calc Af Amer >90  >90 mL/min   Comment: (NOTE)     The eGFR has been calculated using the CKD EPI equation.     This calculation has not been validated in all clinical situations.     eGFR's persistently <90 mL/min signify possible Chronic Kidney     Disease.  CBC WITH DIFFERENTIAL     Status: Abnormal   Collection Time    04/26/14  4:25 AM      Result Value Ref Range   WBC 2.2 (*) 4.0 - 10.5 K/uL   RBC 2.61 (*) 4.22 - 5.81 MIL/uL   Hemoglobin 8.7 (*) 13.0 - 17.0 g/dL   HCT 25.1 (*) 39.0 - 52.0 %   MCV 96.2  78.0 - 100.0 fL   MCH 33.3  26.0 - 34.0 pg   MCHC 34.7  30.0 - 36.0 g/dL   RDW 16.1 (*) 11.5 - 15.5 %   Platelets 40 (*) 150 - 400 K/uL   Comment: REPEATED TO VERIFY     DELTA CHECK NOTED   Neutrophils Relative % 48  43 - 77 %   Neutro Abs 1.0 (*) 1.7 - 7.7 K/uL   Lymphocytes Relative 32  12 - 46 %   Lymphs Abs 0.7  0.7 - 4.0 K/uL   Monocytes Relative 16 (*) 3 - 12 %   Monocytes Absolute 0.3  0.1 - 1.0 K/uL   Eosinophils Relative 4  0 - 5 %   Eosinophils Absolute 0.1  0.0 - 0.7 K/uL   Basophils Relative 1  0 - 1 %   Basophils Absolute 0.0  0.0 - 0.1 K/uL    No  results found.  Review of Systems  Constitutional: Positive for weight loss and malaise/fatigue. Negative for fever and chills.  HENT:  Positive for hearing loss.        Occ HA's  Eyes:       Wears glasses  Respiratory: Negative for hemoptysis.        Occ cough/dyspnea with exertion  Cardiovascular: Positive for claudication. Negative for chest pain.  Gastrointestinal: Negative for nausea, vomiting, abdominal pain and blood in stool.  Genitourinary: Negative for hematuria.  Musculoskeletal: Negative for back pain.  Skin: Positive for rash.       bilat LE petechial rash  Neurological:       Bilat LE weakness/pain  Psychiatric/Behavioral: Positive for depression. The patient is nervous/anxious.    Blood pressure 148/74, pulse 72, temperature 97.9 F (36.6 C), temperature source Oral, resp. rate 16, height 5' 7" (1.702 m), weight 180 lb (81.647 kg), SpO2 97.00%. Physical Exam  Constitutional: Jeffrey Snow is oriented to person, place, and time.  Cardiovascular: Normal rate and regular rhythm.   Respiratory: Effort normal.  Dim BS bases  GI: Soft. Bowel sounds are normal.  Mild distension,NT  Musculoskeletal:  bilat LE petechial/purpuric rash with trace pretibial edema/hypertrophic skin changes; feet warm; +/_ distal pulses  Neurological: Jeffrey Snow is alert and oriented to person, place, and time.    Assessment/Plan: Jeffrey Snow with hx of hepatitis C, pancytopenia, elevated but currently declining troponins,  alcoholic cirrhosis, splenomegaly, small volume ascites,SCD, PVD, HTN, elevated AFP and enlarging right hepatic lobe lesion by recent MRI highly suspicious for HCC. Jeffrey Snow deemed poor surgical candidate by Dr. Barry Dienes secondary to poor functional status and underlying cirrhosis. Imaging studies have been reviewed by Dr. Kathlene Cote and Jeffrey Snow feels Jeffrey Snow is candidate for particle embolization with lipiodol staining of liver lesion followed by thermal ablation. Details of above briefly discussed with Jeffrey Snow by Dr. Kathlene Cote.  Recommend follow up in IR clinic [phone]) upon discharge for formal discussion /planning of above prior to scheduling case. Jeffrey Snow may also benefit from formal cardiology consult prior to hepatic embolization/ablation if Jeffrey Snow chooses to proceed with intervention.  Jeffrey Snow 04/27/2014, 4:17 PM

## 2014-04-27 NOTE — Progress Notes (Signed)
Progress Note   Jeffrey Snow GYB:[id number] DOB: [birth year] DOA: 04/21/2014 PCP: [name],[name], MD   Brief Narrative:   Jeffrey Snow is an 58 y.o. male with a PMH of peripheral neuropathy, hypertension, peripheral arterial disease/arterial insufficiency with chronic intermittent claudication status post arteriogram which showed a right distal SFA occlusion with essentially hypertrophied collaterals from the R profunda femoral artery which constitute a bypass of the R distal SFA occlusion, L popliteal artery is occluded at the knee but numerous geniculate arteries bypass the short segment occlusion, who is under the care of Dr. Bridgett Larsson of vascular surgery who recommended conservative care of his arterial disease with maximal medical management including strict control of blood pressure, blood glucose and lipids, regular exercise, smoking cessation. Dr. Bridgett Larsson did not think the patient's anesthesia/neuropathy symptoms were from his peripheral arterial disease because of his hypertrophic collaterals.  He was admitted 04/21/14 with right leg/foot pain, weakness, and falls. He also has a worsening rash with petechiae.  Plan was initially to obtain biopsy of his liver lesion while in the hospital, but IR felt he was high-risk for this given his thrombocytopenia, and recommended oncology consultation. The patient was seen by Dr. Jana Hakim feels that his presentation is likely consistent with hepatocellular carcinoma, and has placed a surgical consultation.  Assessment/Plan:   Principal Problem:  Cellulitis RLE / Pain in limb / rash and nonspecific skin eruption in a patient with a history of peripheral artery disease/arterial insufficiency/atherosclerosis of native arteries of the extremities with intermittent claudication   Dr. Rockne Menghini discussed with Dr. Bridgett Larsson of vascular surgery 04/21/14 who reviewed the images above. According to Dr. Vallarie Mare, he had a rash similar to the one on his left leg at the  time of the arteriogram. His right leg is worse. He does not feel this is related to the arteriogram and confirms that the patient has good collateral blood flow.   Continue aspirin. Lipids OK.   Followup cryoglobulins to rule out cryoglobulinemia. ESR WNL making vasculitis unlikely. DIC panel negative for schistocytes. HIV nonreactive. Hepatitis C viral load with a log of 5.49.  Dopplers negative for DVT(elevated d-dimer).  The patient has bilateral LE purpura, with superimposed cellulitis on the right, which is being managed with vancomycin. Improved- will transition to PO Doxycycline on 5/13.  Continue Vicodin as needed for pain control. Avoid IV narcotics. Active Problems:  Pancytopenia  Patient has had a significant deterioration in his platelet count which was 115 on 10/12/12.  His WBC and hemoglobin are also low, possibly from bone marrow suppression/liver disease.  Seen by Dr. Jana Hakim 04/25/14 who also feels bone marrow suppression from alcohol use likely etiology.  Counts slowly beginning to improve with abstinence. Depression   Continue Xanax and Prozac. Fall   Physical therapy evaluation performed 04/22/14: SNF recommended, however patient has refused even though he cannot walk. PT reevaluated on 5/12 with same recommendations. HTN (hypertension)   Continue Lotensin and hydrochlorothiazide. Normocytic anemia   Anemia panel shows a ferritin of 671, folate 9.7, B12 877, iron 65, TIBC 165, 39% saturation.  Anemia likely from bone marrow suppression from ETOH and chronic disease. Cirrhosis, alcoholic / coagulopathy   Changes of mild cirrhosis noted on MRI of the abdomen done 01/06/13. Known history of heavy alcohol use in the past.   History of polysubstance abuse in the past. Urine drug screen positive for benzos (on Xanax), HIV nonreactive.   Coagulopathy likely related to cirrhosis. Hyponatremia   Due to dehydration  and HCTZ.Stable. DC IVF. Hypokalemia   Resolved  with potassium added to IV fluids. Metabolic acidosis   Mild elevation of serum lactate noted. Resolved with hydration. Hepatitis C / elevated AFP tumor marker / Abnormal MRI of abdomen, liver  AFP elevated and known right hepatic lobe abnormality on imaging raises concern for hepatocellular carcinoma.  Repeat MRI shows lesion, originally measured 1.3x1.6 cm, now measures 2.7x3.2x2.4 cm.  Dr. Jana Hakim following, surgical consultation called per Dr. Jana Hakim. Surgeons seeing 5/13. Discussed with Dr. Jana Hakim who has discussed with the surgeons and indicates that patient is not going to be a surgical candidate and recommends interventional radiology consultation for consideration for embolization or other ablative procedures. T wave inversion in EKG / Elevated troponins   No complaints of chest pain. Troponins elevated, but declining. Continue aspirin.  2-D echocardiogram normal.  No hyperlipidemia.  Re-check 12 lead EKG 04/22/14 shows persistent T wave inversions in V1/V2.  Troponin now normal.  May consider OP ischemic eval i.e stress test, unless he undergoes IP surgery etc- then will call for preop evaluation. DVT prophylaxis   Heparin/Lovenox contraindicated given thrombocytopenia, and with claudication, would not use SCDs.  Code Status: Full. Family Communication: No family at the bedside. Disposition Plan: Home vs. SNF when stable.   IV Access:    Peripheral IV   Procedures:    2 D Echo 04/22/14: EF 60-65%, no regional wall motion abnormalities.  Left ventricular diastolic function parameters were normal.   Medical Consultants:    Interventional radiology  Dr. Lurline Del, Oncology.  General Surgery   Other Consultants:    PT   Anti-Infectives:    Vancomycin 5/10>  PO Doxycycline 5/13>  Subjective:   Jeffrey Snow states that his lower extremity skin rash has improved. Denies any other complaints.  Objective:    Filed Vitals:    04/26/14 1045 04/26/14 1350 04/26/14 2200 04/27/14 0600  BP: 96/63 126/84 117/57 120/54  Pulse: 70 69 71 74  Temp:  98 F (36.7 C) 97.6 F (36.4 C) 97.6 F (36.4 C)  TempSrc:  Oral  Oral  Resp:  '18 18 18  ' Height:      Weight:      SpO2:  98% 98% 97%    Intake/Output Summary (Last 24 hours) at 04/27/14 1134 Last data filed at 04/27/14 1037  Gross per 24 hour  Intake   2360 ml  Output   1375 ml  Net    985 ml    Exam: Gen:  NAD Cardiovascular:  RRR, No M/R/G Respiratory:  Lungs CTAB Gastrointestinal:  Abdomen soft, NT/ND, + BS Extremities:  RLE erythema much improved/resolved, palpable purpura BLE unchanged.  See images uploaded to note in H&P and progress note 04/24/14.   Data Reviewed:    Labs: Basic Metabolic Panel:  Recent Labs Lab 04/22/14 0530 04/23/14 0533 04/24/14 0757 04/25/14 0453 04/26/14 0425  NA 130* 132* 132* 131* 132*  K 3.8 4.3 4.5 4.6 5.0  CL 94* 100 100 102 101  CO2 '22 23 21 20 19  ' GLUCOSE 84 81 70 82 82  BUN '15 16 18 14 13  ' CREATININE 1.28 1.19 1.16 1.00 0.89  CALCIUM 8.3* 8.1* 8.0* 8.0* 8.0*   GFR Estimated Creatinine Clearance: 91.4 ml/min (by C-G formula based on Cr of 0.89). Liver Function Tests:  Recent Labs Lab 04/21/14 1159 04/23/14 0533 04/24/14 0757 04/25/14 0453 04/26/14 0425  AST 48* 58* 50* 54* 59*  ALT '30 31 30 29 30  ' ALKPHOS 69  69 65 66 59  BILITOT 4.1* 3.1* 2.4* 2.3* 2.5*  PROT 5.4* 5.2* 5.2* 4.8* 5.1*  ALBUMIN 2.3* 2.3* 2.2* 2.0* 2.1*   Coagulation profile  Recent Labs Lab 04/21/14 1700 04/22/14 0951  INR 1.89* 1.77*    CBC:  Recent Labs Lab 04/21/14 1159 04/21/14 1700 04/22/14 0951 04/24/14 0757 04/25/14 0453 04/26/14 0425  WBC 4.4  --  4.8 2.2* 1.8* 2.2*  NEUTROABS 3.7  --   --   --   --  1.0*  HGB 9.5*  --  9.8* 8.8* 7.7* 8.7*  HCT 26.5*  --  27.4* 24.9* 21.6* 25.1*  MCV 92.7  --  92.9 93.3 93.1 96.2  PLT 29* 28* 36* 32* 26* 40*   Cardiac Enzymes:  Recent Labs Lab 04/21/14 1700  04/21/14 2229 04/22/14 0530 04/23/14 0533  TROPONINI 0.65* 0.73* 0.44* <0.30   Sepsis Labs:  Recent Labs Lab 04/21/14 1159 04/21/14 1417 04/22/14 0951 04/23/14 0534 04/24/14 0757 04/25/14 0453 04/26/14 0425  WBC 4.4  --  4.8  --  2.2* 1.8* 2.2*  LATICACIDVEN  --  3.2*  --  1.2  --   --   --    Microbiology No results found for this or any previous visit (from the past 240 hour(s)).   Radiographs/Studies:   Dg Tibia/fibula Right  04/21/2014   CLINICAL DATA:  Fall.  EXAM: RIGHT TIBIA AND FIBULA - 2 VIEW  COMPARISON:  None.  FINDINGS: No acute bony or joint abnormality identified. No fracture or dislocation. Peripheral vascular calcification present.  IMPRESSION: No acute bony or joint abnormality.   Electronically Signed   By: Marcello Moores  Register   On: 04/21/2014 13:08   Dg Foot Complete Right  03/31/2014   CLINICAL DATA:  right foot pain  EXAM: RIGHT FOOT COMPLETE - 3+ VIEW  COMPARISON:  None.  FINDINGS: There is no evidence of fracture or dislocation. There is no evidence of arthropathy or other focal bone abnormality. Soft tissues are unremarkable.  IMPRESSION: Negative.   Electronically Signed   By: Margaree Mackintosh M.D.   On: 03/31/2014 14:22   No results found. Medications:   . ALPRAZolam  0.5 mg Oral TID  . aspirin  81 mg Oral Daily  . benazepril  40 mg Oral Daily  . FLUoxetine  20 mg Oral Daily  . folic acid  1 mg Oral Daily  . hydrocerin   Topical BID  . hydrochlorothiazide  25 mg Oral Daily  . thiamine  100 mg Oral Daily  . vancomycin  1,000 mg Intravenous Q24H  . zolpidem  10 mg Oral QHS   Continuous Infusions: . 0.9 % NaCl with KCl 20 mEq / L 75 mL/hr at 04/26/14 1341    Time spent: 40 minutes.    LOS: 6 days   Modena Jansky, MD, FACP, [facility]. [facility] Pager [phone]  If 7PM-7AM, please contact night-coverage [email or web address] Password [email or web address] 04/27/2014, 11:45 AM  04/27/2014, 11:34 AM    **Disclaimer: This note was dictated with voice  recognition software. Similar sounding words can inadvertently be transcribed and this note may contain transcription errors which may not have been corrected upon publication of note.**

## 2014-04-27 NOTE — Consult Note (Signed)
Patient seen and examined.  The 3 cm hypervascular mass of liver is almost certainly HCC in the setting of cirrhosis and elevated AFP.  Combination of coagulopathy, thrombocytopenia and ascites around the liver make percutaneous biopsy of significantly higher risk.  Percutaneous tumor ablation is technically feasible at lesion size and location and would require general anesthesia.  Transcatheter embolization/chemoembolization would also be a possible approach to treat tumor initially followed by ablation of any residual enhancing tumor, if necessary.  Given higher risks and the potential need for additional cardiac clearance, recommend that the patient be evaluated as an outpatient for hepatic tumor treatment.  We will follow the patient in the hospital and arrange for outpatient evaluation next week in our Clinic.  I will be out of town and have arranged for Dr. Laurence Ferrari to evaluate the patient next Wed in clinic and discuss treatment options in detail at that time.

## 2014-04-27 NOTE — Consult Note (Signed)
Reason for Consult:  Liver Mass possible cancer Referring Physician:  Dr. Sarajane Jews Magrinat  Jeffrey Snow is an 58 y.o. male.  HPI: Patient was admitted on 04/21/14 with complaints of leg swelling, leg rash and falling several times before admit.   He has know peripheral vascular disease and has bilateral lower leg peripheral arterial disease with chronic intermittent claudication;  arteriogram has shown a right distal SFA occlusion with essentially hypertrophied collaterals from the R profunda femoral artery which constitute a bypass of the R distal SFA occlusion, L popliteal artery is occluded at the knee but numerous geniculate arteries bypass the short segment occlusion.  This is being managed with conservative care by Dr. Bridgett Larsson.  He complained of 10/10 pain on admit from both lower legs and reported falling, reports his feet would not work and he uses a walker at home. He had lower extremity rash and cellulitis.  His complex medical history includes alcoholic cirrhosis, with a known right hepatic lobe abnormality, and possible hepatocellular carcinoma.  During this admission work up has revealed a pancytopenia, AFP which was elevated at 75. Cirrhosis with splenomegaly and small volume abdominal ascites, hepatitis C, history of Sickle cell disease.  HIV is negative.   MRI on 04/25/14 shows a 3.2 cm enhancing lesion which has increased in size from 1.6 cm in January 2014.  His plaetlet count is to low to consider biopsy of the liver mass.  He has been seen by Dr. Jana Hakim, and he is concerned this is a hepatocellular cancer. His pancytopenia including his platelet count are attributed to ongoing alcohol use, which patient currently denies.  Dr. Jana Hakim would like to consider treatment, and this includes possible resection of liver mass.  Other avenues would include IR embolization or ablation.  Dr. Barry Dienes was ask to see and evaluate for surgical treatment.  Past Medical History  Diagnosis Date   Depression   Anxiety   Panic attack   Hypertension   Hepatitis C   Cirrhosis, alcoholic 03/24/7025     Bilateral Peripheral vascular diseae Neuropathy   Hx of Alcohol abuse   Hx of tobacco use  40 years 1PPD        Past Surgical History  Procedure Laterality Date  . Cosmetic surgery      facial reconstructive surgery  . Aortogram      Family History  Problem Relation Age of Onset  . Diabetes Father   . Diabetes Brother   . Diabetes Brother     Social History:  reports that he quit smoking about 2 years ago. He has never used smokeless tobacco. He reports that he drinks about 3.6 ounces of alcohol per week. He reports that he does not use illicit drugs.  Allergies: No Known Allergies  Medications:  Prior to Admission:  Facility-administered medications prior to admission  Medication Dose Route Frequency Provider Last Rate Last Dose  . influenza vac split quadrivalent PF (FLUARIX) injection 0.5 mL  0.5 mL Intramuscular Once Leana Gamer, MD       Prescriptions prior to admission  Medication Sig Dispense Refill  . ALPRAZolam (XANAX) 0.5 MG tablet Take 0.5 mg by mouth 3 (three) times daily.      Marland Kitchen aspirin 81 MG chewable tablet Chew 81 mg by mouth daily.      . benazepril (LOTENSIN) 40 MG tablet Take 1 tablet (40 mg total) by mouth daily.  30 tablet  3  . ergocalciferol (DRISDOL) 50000 UNITS capsule Take 1 capsule (50,000 Units  total) by mouth once a week.  4 capsule  12  . FLUoxetine (PROZAC) 20 MG capsule Take 20 mg by mouth daily.      . hydrochlorothiazide (HYDRODIURIL) 25 MG tablet Take 1 tablet (25 mg total) by mouth daily.  90 tablet  3  . hydrocortisone (ANUSOL-HC) 2.5 % rectal cream Place 1 application rectally 2 (two) times daily as needed for hemorrhoids or itching.      . permethrin (ELIMITE) 5 % cream Apply cream from head to toe; leave on for 8-14 hours before washing off with water; may reapply in 1 week if live mites appear.  60 g  1  . zolpidem  (AMBIEN) 10 MG tablet Take 10 mg by mouth at bedtime.        Scheduled: . ALPRAZolam  0.5 mg Oral TID  . aspirin  81 mg Oral Daily  . benazepril  40 mg Oral Daily  . FLUoxetine  20 mg Oral Daily  . folic acid  1 mg Oral Daily  . hydrocerin   Topical BID  . hydrochlorothiazide  25 mg Oral Daily  . thiamine  100 mg Oral Daily  . vancomycin  1,000 mg Intravenous Q24H  . zolpidem  10 mg Oral QHS   Continuous: . 0.9 % NaCl with KCl 20 mEq / L 75 mL/hr at 04/26/14 1341   XIP:[id number], acetaminophen, alum & mag hydroxide-simeth, guaiFENesin-dextromethorphan, HYDROcodone-acetaminophen, hydrocortisone, ondansetron (ZOFRAN) IV, ondansetron Anti-infectives   Start     Dose/Rate Route Frequency Ordered Stop   04/27/14 0000  vancomycin (VANCOCIN) IVPB 1000 mg/200 mL premix     1,000 mg 200 mL/hr over 60 Minutes Intravenous Every 24 hours 04/26/14 0828     04/26/14 1400  vancomycin (VANCOCIN) IVPB 750 mg/150 ml premix  Status:  Discontinued     750 mg 150 mL/hr over 60 Minutes Intravenous Every 12 hours 04/26/14 0156 04/26/14 0828   04/23/14 1200  vancomycin (VANCOCIN) IVPB 1000 mg/200 mL premix  Status:  Discontinued     1,000 mg 200 mL/hr over 60 Minutes Intravenous Every 12 hours 04/23/14 1139 04/26/14 0156   04/22/14 1600  cephALEXin (KEFLEX) capsule 500 mg  Status:  Discontinued     500 mg Oral 4 times per day 04/22/14 1419 04/23/14 1126      Results for orders placed during the hospital encounter of 04/21/14 (from the past 48 hour(s))  VANCOMYCIN, TROUGH     Status: None   Collection Time    04/25/14 11:40 PM      Result Value Ref Range   Vancomycin Tr 19.3  10.0 - 20.0 ug/mL  COMPREHENSIVE METABOLIC PANEL     Status: Abnormal   Collection Time    04/26/14  4:25 AM      Result Value Ref Range   Sodium 132 (*) 137 - 147 mEq/L   Potassium 5.0  3.7 - 5.3 mEq/L   Chloride 101  96 - 112 mEq/L   CO2 19  19 - 32 mEq/L   Glucose, Bld 82  70 - 99 mg/dL   BUN 13  6 - 23 mg/dL    Creatinine, Ser 0.89  0.50 - 1.35 mg/dL   Calcium 8.0 (*) 8.4 - 10.5 mg/dL   Total Protein 5.1 (*) 6.0 - 8.3 g/dL   Albumin 2.1 (*) 3.5 - 5.2 g/dL   AST 59 (*) 0 - 37 U/L   Comment: NO VISIBLE HEMOLYSIS     HEMOLYSIS AT THIS LEVEL MAY AFFECT RESULT  ALT 30  0 - 53 U/L   Alkaline Phosphatase 59  39 - 117 U/L   Total Bilirubin 2.5 (*) 0.3 - 1.2 mg/dL   GFR calc non Af Amer >90  >90 mL/min   GFR calc Af Amer >90  >90 mL/min   Comment: (NOTE)     The eGFR has been calculated using the CKD EPI equation.     This calculation has not been validated in all clinical situations.     eGFR's persistently <90 mL/min signify possible Chronic Kidney     Disease.  CBC WITH DIFFERENTIAL     Status: Abnormal   Collection Time    04/26/14  4:25 AM      Result Value Ref Range   WBC 2.2 (*) 4.0 - 10.5 K/uL   RBC 2.61 (*) 4.22 - 5.81 MIL/uL   Hemoglobin 8.7 (*) 13.0 - 17.0 g/dL   HCT 25.1 (*) 39.0 - 52.0 %   MCV 96.2  78.0 - 100.0 fL   MCH 33.3  26.0 - 34.0 pg   MCHC 34.7  30.0 - 36.0 g/dL   RDW 16.1 (*) 11.5 - 15.5 %   Platelets 40 (*) 150 - 400 K/uL   Comment: REPEATED TO VERIFY     DELTA CHECK NOTED   Neutrophils Relative % 48  43 - 77 %   Neutro Abs 1.0 (*) 1.7 - 7.7 K/uL   Lymphocytes Relative 32  12 - 46 %   Lymphs Abs 0.7  0.7 - 4.0 K/uL   Monocytes Relative 16 (*) 3 - 12 %   Monocytes Absolute 0.3  0.1 - 1.0 K/uL   Eosinophils Relative 4  0 - 5 %   Eosinophils Absolute 0.1  0.0 - 0.7 K/uL   Basophils Relative 1  0 - 1 %   Basophils Absolute 0.0  0.0 - 0.1 K/uL    Mr Abdomen W Wo Contrast  04/25/2014   CLINICAL DATA:  Follow-up liver lesion, elevated AFP  EXAM: MRI ABDOMEN WITHOUT AND WITH CONTRAST  TECHNIQUE: Multiplanar multisequence MR imaging of the abdomen was performed both before and after the administration of intravenous contrast.  CONTRAST:  20m MULTIHANCE GADOBENATE DIMEGLUMINE 529 MG/ML IV SOLN  COMPARISON:  01/30/2013  FINDINGS: Motion degraded images.  Morphologic  findings of cirrhosis.  No hepatic steatosis.  2.7 x 3.2 x 2.4 cm T2 hyperintense lesion with early arterial enhancement in the posterior segment right hepatic lobe (series 1101/ image 43), previously 1.6 cm. No definite washout on delayed phase images (series 13/ image 37). Lesion is conspicuous on T1 in-phase imaging (series 7/ image 42) with associated signal loss on opposed phase, suggesting intracellular lipid. While this lesion does not meet AASLD criteria for imaging diagnosis of hepatocellular carcinoma due to lack of portal venous washout, an enlarging enhancing lesion in the presence of cirrhosis is highly suspicious for hepatocellular carcinoma.  Splenomegaly, measuring 16.8 cm and craniocaudal dimension.  Small volume perihepatic and perisplenic ascites.  Portal vein is patent.  Mild gallbladder wall thickening/edema, likely secondary, although with layering small gallstones (series 10/image 28). No intrahepatic or extrahepatic ductal dilatation.  Small upper abdominal lymph nodes measuring up to 10 mm short axis (series 1102/image 49), likely reactive.  Trace left pleural effusion.  No focal osseous lesions.  IMPRESSION: 3.2 cm enhancing lesion in the posterior segment right hepatic lobe, increased, highly suspicious for hepatocellular carcinoma.  Cirrhosis with splenomegaly and small volume abdominal ascites. Portal vein is patent.  Cholelithiasis with  suspected secondary gallbladder wall thickening/edema. If there is clinical concern for cholecystitis, consider hepatobiliary nuclear medicine scan.   Electronically Signed   By: Julian Hy M.D.   On: 04/25/2014 08:16    Review of Systems  Constitutional: Positive for weight loss (he doesn't really know). Negative for fever and chills.       He reports progressive debilitation, and his independence is declining.  He is walker bound and it sounds like he uses a wheel chair at times.    HENT: Positive for hearing loss (somewhat diminished.).    Eyes: Negative.        Needs new glasses.  Respiratory: Positive for cough and wheezing. Negative for hemoptysis.   Cardiovascular: Negative.   Gastrointestinal: Positive for constipation (occasional). Negative for heartburn, nausea, vomiting, abdominal pain, diarrhea, blood in stool and melena.       He says his stomach is  About the same a usual.  Genitourinary: Negative.   Musculoskeletal: Positive for falls (Multiple falls at home before admitted here.).  Skin: Positive for itching and rash.       Lower leg swelling  Neurological: Negative.   Endo/Heme/Allergies: Negative.   Psychiatric/Behavioral: Positive for depression. The patient is nervous/anxious.    Blood pressure 117/57, pulse 71, temperature 97.6 F (36.4 C), temperature source Oral, resp. rate 18, height _0  (1.702 m), weight 81.647 kg (180 lb), SpO2 98.00%. Physical Exam  Constitutional: He is oriented to person, place, and time. He appears well-developed. No distress.  Does not appear well nourished.  HENT:  Head: Normocephalic and atraumatic.  Nose: Nose normal.  Eyes: Conjunctivae and EOM are normal. Pupils are equal, round, and reactive to light. Right eye exhibits no discharge. Left eye exhibits no discharge. No scleral icterus.  Neck: Normal range of motion. Neck supple. No JVD present. No tracheal deviation present. No thyromegaly present.  Cardiovascular: Normal rate, regular rhythm and normal heart sounds.  Exam reveals no gallop.   No murmur heard. Respiratory: Effort normal. No respiratory distress. He has no wheezes. He has no rales. He exhibits no tenderness.  BS diminished in both bases.  GI: Soft. He exhibits distension (mild distension from ascites.). He exhibits no mass. There is no tenderness. There is no rebound and no guarding.  Genitourinary: Penis normal.  Musculoskeletal: He exhibits edema (trace edema). He exhibits no tenderness.  Lymphadenopathy:    He has no cervical adenopathy.   Neurological: He is alert and oriented to person, place, and time. No cranial nerve deficit.  Skin: Skin is warm and dry. Rash noted. He is not diaphoretic. No erythema. No pallor.  Thick skin lower legs with papules all over.  He had a cellulitis which I think is much better.  He has trace to +1 edema both lower legs.  Psychiatric: He has a normal mood and affect. His behavior is normal. Judgment and thought content normal.    Assessment/Plan: 1.  3.2 cm liver mass, suspected hepatocellular carcinoma. 2.  Hepatitis C 3.  Alcoholic cirrhosis 4.  Pancytopenia with platelet count now up to 40K 5.  Hx of tobacco and alcohol use  (He reports he has quit both.) 6.  AFP elevation 7.  Multiple falls 8.  Bilateral PVD/walker bound/deconditioning 9.  Malnutrition  Plan:  Dr. Barry Dienes will see and make recommendations.  Currently he is not a surgical candidate based on labs and performance status.  He may require consult for Hepatology and transplant.  Dr. Barry Dienes will see today and make  further recommendations.  Earnstine Regal 04/27/2014, 6:49 AM

## 2014-04-27 NOTE — Consult Note (Signed)
Seen, agree with above.  Pt not surgical candidate based on level of cirrhosis and poor functional status.  He is between childs b and c depending lab values.  His surgical morbidity is too high for consideration of resection.    I recommend that he see a liver transplant team to see if he may be a candidate (probably not based on functional status and recent cessation of EtOH).    If not, I recommend evaluation by interventional radiology for consideration of microwave ablation.

## 2014-04-28 ENCOUNTER — Other Ambulatory Visit: Payer: Self-pay | Admitting: Oncology

## 2014-04-28 DIAGNOSIS — D892 Hypergammaglobulinemia, unspecified: Secondary | ICD-10-CM

## 2014-04-28 DIAGNOSIS — F04 Amnestic disorder due to known physiological condition: Secondary | ICD-10-CM

## 2014-04-28 DIAGNOSIS — D693 Immune thrombocytopenic purpura: Secondary | ICD-10-CM

## 2014-04-28 DIAGNOSIS — F068 Other specified mental disorders due to known physiological condition: Secondary | ICD-10-CM

## 2014-04-28 DIAGNOSIS — R161 Splenomegaly, not elsewhere classified: Secondary | ICD-10-CM

## 2014-04-28 DIAGNOSIS — D6959 Other secondary thrombocytopenia: Secondary | ICD-10-CM

## 2014-04-28 DIAGNOSIS — D61818 Other pancytopenia: Secondary | ICD-10-CM

## 2014-04-28 LAB — CBC WITH DIFFERENTIAL/PLATELET
Basophils Absolute: 0 10*3/uL (ref 0.0–0.1)
Basophils Absolute: 0 10*3/uL (ref 0.0–0.1)
Basophils Relative: 1 % (ref 0–1)
Basophils Relative: 1 % (ref 0–1)
Eosinophils Absolute: 0 10*3/uL (ref 0.0–0.7)
Eosinophils Absolute: 0.1 10*3/uL (ref 0.0–0.7)
Eosinophils Relative: 2 % (ref 0–5)
Eosinophils Relative: 3 % (ref 0–5)
HCT: 22.1 % — ABNORMAL LOW (ref 39.0–52.0)
HCT: 24.2 % — ABNORMAL LOW (ref 39.0–52.0)
Hemoglobin: 7.6 g/dL — ABNORMAL LOW (ref 13.0–17.0)
Hemoglobin: 8.5 g/dL — ABNORMAL LOW (ref 13.0–17.0)
Lymphocytes Relative: 43 % (ref 12–46)
Lymphocytes Relative: 42 % (ref 12–46)
Lymphs Abs: 0.7 10*3/uL (ref 0.7–4.0)
Lymphs Abs: 0.7 10*3/uL (ref 0.7–4.0)
MCH: 33.3 pg (ref 26.0–34.0)
MCH: 33.5 pg (ref 26.0–34.0)
MCHC: 34.4 g/dL (ref 30.0–36.0)
MCHC: 35.1 g/dL (ref 30.0–36.0)
MCV: 96.9 fL (ref 78.0–100.0)
MCV: 95.3 fL (ref 78.0–100.0)
Monocytes Absolute: 0.2 10*3/uL (ref 0.1–1.0)
Monocytes Absolute: 0.2 10*3/uL (ref 0.1–1.0)
Monocytes Relative: 15 % — ABNORMAL HIGH (ref 3–12)
Monocytes Relative: 15 % — ABNORMAL HIGH (ref 3–12)
Neutro Abs: 0.5 10*3/uL — ABNORMAL LOW (ref 1.7–7.7)
Neutro Abs: 0.6 10*3/uL — ABNORMAL LOW (ref 1.7–7.7)
Neutrophils Relative %: 39 % — ABNORMAL LOW (ref 43–77)
Neutrophils Relative %: 38 % — ABNORMAL LOW (ref 43–77)
Platelets: 23 10*3/uL — CL (ref 150–400)
Platelets: 33 10*3/uL — ABNORMAL LOW (ref 150–400)
RBC: 2.28 MIL/uL — ABNORMAL LOW (ref 4.22–5.81)
RBC: 2.54 MIL/uL — ABNORMAL LOW (ref 4.22–5.81)
RDW: 16.9 % — ABNORMAL HIGH (ref 11.5–15.5)
RDW: 17 % — ABNORMAL HIGH (ref 11.5–15.5)
WBC: 1.4 10*3/uL — CL (ref 4.0–10.5)
WBC: 1.5 10*3/uL — ABNORMAL LOW (ref 4.0–10.5)

## 2014-04-28 LAB — ABO/RH: ABO/RH(D): O POS

## 2014-04-28 LAB — BASIC METABOLIC PANEL
BUN: 13 mg/dL (ref 6–23)
CO2: 20 mEq/L (ref 19–32)
Calcium: 8.1 mg/dL — ABNORMAL LOW (ref 8.4–10.5)
Chloride: 101 mEq/L (ref 96–112)
Creatinine, Ser: 0.9 mg/dL (ref 0.50–1.35)
GFR calc Af Amer: >90 mL/min (ref >90)
GFR calc non Af Amer: >90 mL/min (ref >90)
Glucose, Bld: 63 mg/dL — ABNORMAL LOW (ref 70–99)
Potassium: 4.7 mEq/L (ref 3.7–5.3)
Sodium: 133 mEq/L — ABNORMAL LOW (ref 137–147)

## 2014-04-28 LAB — CRYOGLOBULIN

## 2014-04-28 LAB — COMPREHENSIVE METABOLIC PANEL
ALT: 30 U/L (ref 0–53)
AST: 64 U/L — ABNORMAL HIGH (ref 0–37)
Albumin: 2.3 g/dL — ABNORMAL LOW (ref 3.5–5.2)
Alkaline Phosphatase: 58 U/L (ref 39–117)
BUN: 13 mg/dL (ref 6–23)
CO2: 20 mEq/L (ref 19–32)
Calcium: 8.3 mg/dL — ABNORMAL LOW (ref 8.4–10.5)
Chloride: 99 mEq/L (ref 96–112)
Creatinine, Ser: 0.92 mg/dL (ref 0.50–1.35)
GFR calc Af Amer: >90 mL/min (ref >90)
GFR calc non Af Amer: >90 mL/min (ref >90)
Glucose, Bld: 69 mg/dL — ABNORMAL LOW (ref 70–99)
Potassium: 4.8 mEq/L (ref 3.7–5.3)
Sodium: 131 mEq/L — ABNORMAL LOW (ref 137–147)
Total Bilirubin: 2.4 mg/dL — ABNORMAL HIGH (ref 0.3–1.2)
Total Protein: 5.4 g/dL — ABNORMAL LOW (ref 6.0–8.3)

## 2014-04-28 LAB — DIC (DISSEMINATED INTRAVASCULAR COAGULATION)PANEL
D-Dimer, Quant: 4.32 ug/mL-FEU — ABNORMAL HIGH (ref 0.00–0.48)
Fibrinogen: 174 mg/dL — ABNORMAL LOW (ref 204–475)
INR: 1.96 — ABNORMAL HIGH (ref 0.00–1.49)
Platelets: 36 10*3/uL — ABNORMAL LOW (ref 150–400)
Prothrombin Time: 21.7 seconds — ABNORMAL HIGH (ref 11.6–15.2)
Smear Review: NONE SEEN
aPTT: 37 seconds (ref 24–37)
aPTT: 40 seconds — ABNORMAL HIGH (ref 24–37)

## 2014-04-28 LAB — PATHOLOGIST SMEAR REVIEW

## 2014-04-28 LAB — DIC (DISSEMINATED INTRAVASCULAR COAGULATION) PANEL (NOT AT ARMC)
D-Dimer, Quant: 5.86 ug/mL-FEU — ABNORMAL HIGH (ref 0.00–0.48)
Fibrinogen: 200 mg/dL — ABNORMAL LOW (ref 204–475)
INR: 1.85 — ABNORMAL HIGH (ref 0.00–1.49)
Prothrombin Time: 20.8 seconds — ABNORMAL HIGH (ref 11.6–15.2)
Smear Review: NONE SEEN

## 2014-04-28 LAB — DIC (DISSEMINATED INTRAVASCULAR COAGULATION) PANEL: Platelets: 40 10*3/uL — ABNORMAL LOW (ref 150–400)

## 2014-04-28 MED ORDER — SODIUM CHLORIDE 0.9 % IJ SOLN: 3.0000 mL | INTRAMUSCULAR | Status: DC | PRN

## 2014-04-28 MED ORDER — SODIUM CHLORIDE 0.9 % IJ SOLN: 10.0000 mL | INTRAMUSCULAR | Status: DC | PRN

## 2014-04-28 MED ORDER — HEPARIN SOD (PORK) LOCK FLUSH 100 UNIT/ML IV SOLN: 250.0000 [IU] | INTRAVENOUS | Status: DC | PRN

## 2014-04-28 MED ORDER — DIPHENHYDRAMINE HCL 25 MG PO CAPS
25.0000 mg | ORAL_CAPSULE | Freq: Once | ORAL | Status: AC
  Administered 2014-04-28: 25 mg via ORAL
  Filled 2014-04-28: qty 1

## 2014-04-28 MED ORDER — HYDROCODONE-ACETAMINOPHEN 5-325 MG PO TABS
1.0000 | ORAL_TABLET | Freq: Four times a day (QID) | ORAL | Status: DC | PRN
  Administered 2014-04-28 – 2014-05-01 (×6): 1 via ORAL
  Filled 2014-04-28 (×6): qty 1

## 2014-04-28 MED ORDER — ACETAMINOPHEN 325 MG PO TABS
650.0000 mg | ORAL_TABLET | Freq: Once | ORAL | Status: AC
  Administered 2014-04-28: 650 mg via ORAL
  Filled 2014-04-28: qty 2

## 2014-04-28 MED ORDER — HEPARIN SOD (PORK) LOCK FLUSH 100 UNIT/ML IV SOLN: 500.0000 [IU] | Freq: Every day | INTRAVENOUS | Status: DC | PRN

## 2014-04-28 MED ORDER — SODIUM CHLORIDE 0.9 % IV SOLN
250.0000 mL | Freq: Once | INTRAVENOUS | Status: AC
  Administered 2014-04-28: 250 mL via INTRAVENOUS

## 2014-04-28 NOTE — Care Management Note (Unsigned)
    Page 1 of 2   05/03/2014     2:11:38 PM CARE MANAGEMENT NOTE 05/03/2014  Patient:  Jeffrey Snow, Jeffrey Snow   Account Number:  [id number]  Date Initiated:  04/28/2014  Documentation initiated by:  [facility]  Subjective/Objective Assessment:   58 year old male admitted with cellulits of LE.     Action/Plan:   From home alone. PT recommending SNF but pt declines to go.   Anticipated DC Date:  05/02/2014   Anticipated DC Plan:  [facility]  In-house referral  Clinical Social Worker      DC Planning Services  CM consult      Choice offered to / List presented to:  C-1 Patient        [place] arranged  HH-1 RN  [place].   Status of service:  In process, will continue to follow Medicare Important Message given?  NO (If response is "NO", the following Medicare IM given date fields will be blank) Date Medicare IM given:   Date Additional Medicare IM given:    Discharge Disposition:    Per UR Regulation:  Reviewed for med. necessity/level of care/duration of stay  If discussed at [place] of Stay Meetings, dates discussed:    Comments:  05/02/14 Allene Dillon RN BSN [phone] Referral made to [facility] but they are not contracted with Medicaid hence pt ineligible for their program.  I spoke with Maudie Mercury with [facility]. She had already met with pt last week and will follow up with him once discharged. [place] services also set up for pt.   04/28/14 Allene Dillon RN BSN [phone] Met with pt at bedside to discuss d/c planning. PT is recommending SNF at d/c but pt refuses to go. Medicaid will not pay for HHPT services as pt does not have the appropriate diagnosis. CM will arrange for [facility] services. Discussed this with pt and he chose [facility] to provide the services. Referral made.

## 2014-04-28 NOTE — Progress Notes (Signed)
Clinical Social Work  CSW received referral in order to talk with patient about advanced directives. CSW met with patient at bedside who reports he did not ask to complete these. CSW explained the living will and HCPOA paperwork and encouraged patient to consider completing documents. Patient reports he will review information in order to determine if he desires to complete these documents. Patient aware that he should not sign documents unless witnesses and notary there to make forms legal.  Sindy Messing, LCSW [phone]

## 2014-04-28 NOTE — Progress Notes (Addendum)
Progress Note   Jeffrey Snow:[id number] DOB: October 01, 1956 DOA: 04/21/2014 PCP: [name],[name], MD   Brief Narrative:   Jeffrey Snow is an 58 y.o. male with a PMH of peripheral neuropathy, hypertension, peripheral arterial disease/arterial insufficiency with chronic intermittent claudication status post arteriogram which showed a right distal SFA occlusion with essentially hypertrophied collaterals from the R profunda femoral artery which constitute a bypass of the R distal SFA occlusion, L popliteal artery is occluded at the knee but numerous geniculate arteries bypass the short segment occlusion, who is under the care of Dr. Bridgett Larsson of vascular surgery who recommended conservative care of his arterial disease with maximal medical management including strict control of blood pressure, blood glucose and lipids, regular exercise, smoking cessation. Dr. Bridgett Larsson did not think the patient's anesthesia/neuropathy symptoms were from his peripheral arterial disease because of his hypertrophic collaterals.  He was admitted 04/21/14 with right leg/foot pain, weakness, and falls. He also has a worsening rash with petechiae.  Plan was initially to obtain biopsy of his liver lesion while in the hospital, but IR felt he was high-risk for this given his thrombocytopenia, and recommended oncology consultation. The patient was seen by Dr. Jana Hakim feels that his presentation is likely consistent with hepatocellular carcinoma, and has placed a surgical consultation.  Assessment/Plan:   Principal Problem:  Cellulitis RLE / Pain in limb / rash and nonspecific skin eruption in a patient with a history of peripheral artery disease/arterial insufficiency/atherosclerosis of native arteries of the extremities with intermittent claudication   Dr. Rockne Menghini discussed with Dr. Bridgett Larsson of vascular surgery 04/21/14 who reviewed the images above. According to Dr. Vallarie Mare, he had a rash similar to the one on his left leg at the  time of the arteriogram. His right leg is worse. He does not feel this is related to the arteriogram and confirms that the patient has good collateral blood flow.   Continue aspirin. Lipids OK.   Followup cryoglobulins to rule out cryoglobulinemia. ESR WNL making vasculitis unlikely. DIC panel negative for schistocytes. HIV nonreactive. Hepatitis C viral load with a log of 5.49.  Dopplers negative for DVT(elevated d-dimer).  The patient has bilateral LE purpura, with superimposed cellulitis on the right, which is being managed with vancomycin. Improved- transitioned to PO Doxycycline on 5/13.  Continue Vicodin as needed for pain control. Avoid IV narcotics.  As per oncology, patient's skin rash may be secondary to cryoglobulinemia. Active Problems:  Pancytopenia  Patient has had a significant deterioration in his platelet count which was 115 on 10/12/12.  His WBC and hemoglobin are also low, possibly from bone marrow suppression/liver disease.  As per oncology, pancytopenia most likely secondary to marrow suppression from alcohol abuse, but consider MDS. Thrombocytopenia could also be secondary to splenomegaly, +/- ITP. Please refer to detailed oncology note for 5/14 regarding management. Trial of platelet transfusion on 5/14  Transfuse PRBCs if hemoglobin is less than 7 g per DL. Leukopenia/neutropenia but no fevers-will not change antibiotic regimen.  Discontinue the aspirin due to severe thrombocytopenia and high risk for bleeding. Depression   Continue Xanax and Prozac.  Suspected cognitive impairment-We'll get psychiatric consultation to assess for medical decision making capacity. Fall   Physical therapy evaluation performed 04/22/14: SNF recommended, however patient has refused even though he cannot walk. PT reevaluated on 5/12 with same recommendations. HTN (hypertension)   Continue Lotensin and hydrochlorothiazide. Normocytic anemia   Anemia panel shows a ferritin of 671,  folate 9.7, B12 877, iron  65, TIBC 165, 39% saturation.  Anemia likely from bone marrow suppression from ETOH and chronic disease. Cirrhosis, alcoholic / coagulopathy   Changes of mild cirrhosis noted on MRI of the abdomen done 01/06/13. Known history of heavy alcohol use in the past.   History of polysubstance abuse in the past. Urine drug screen positive for benzos (on Xanax), HIV nonreactive.   Coagulopathy likely related to cirrhosis. Hyponatremia   Due to dehydration and HCTZ.Stable. DC IVF. Hypokalemia   Resolved with potassium added to IV fluids. Metabolic acidosis   Mild elevation of serum lactate noted. Resolved with hydration. Hepatitis C / elevated AFP tumor marker / Abnormal MRI of abdomen, liver/clinical diagnosis of hepatoma  Right liver lesion, increasing in size since January, in the setting of cirrhosis and hepatitis C positivity, elevated AFP of 75 supporting clinical diagnosis of hepatoma.  Please referred to detailed oncology note for 5/14.  Surgical ablation not advisable by surgery but transcatheter embolization may be feasible by IR. This can be done only after platelet counts improved. Management of thrombocytopenia as stated above. Interventional radiology consultation appreciated and plan intervention as OP T wave inversion in EKG / Elevated troponins   No complaints of chest pain. Troponins elevated, but declining. Continue aspirin.  2-D echocardiogram normal.  No hyperlipidemia.  Re-check 12 lead EKG 04/22/14 shows persistent T wave inversions in V1/V2.  Troponin now normal.  May consider OP ischemic eval i.e stress test, unless he undergoes IP surgery etc- then will call for preop evaluation. DVT prophylaxis   Heparin/Lovenox contraindicated given thrombocytopenia, and with claudication, would not use SCDs.  Code Status: Full. Family Communication: discussed with brother Mr. Theresa Dohrman, updated care and answered questions Disposition Plan:  Home vs. SNF when stable.   IV Access:    Peripheral IV   Procedures:    2 D Echo 04/22/14: EF 60-65%, no regional wall motion abnormalities.  Left ventricular diastolic function parameters were normal.   Medical Consultants:    Interventional radiology  Dr. Lurline Del, Oncology.  General Surgery  Psychiatry   Other Consultants:    PT   Anti-Infectives:    Vancomycin 5/10>  PO Doxycycline 5/13>  Subjective:   Treylon E Speich upset this morning because she did not sleep well last night and states that he interrupted several times overnight for vital signs, laboratory etc. Denies pain issues. States leg rash has improved. Patient seems cognitively impaired and keeps asking same questions repeatedly and unsure if he comprehends details and severity of his condition.  Objective:    Filed Vitals:   04/27/14 1457 04/27/14 2125 04/28/14 0607 04/28/14 1418  BP: 148/74 147/67 120/66 127/60  Pulse: 72 65 68 68  Temp: 97.9 F (36.6 C) 97.7 F (36.5 C) 98.8 F (37.1 C) 98.4 F (36.9 C)  TempSrc: Oral Axillary Axillary Oral  Resp: _0 Height:      Weight:      SpO2: 97% 98% 96% 98%    Intake/Output Summary (Last 24 hours) at 04/28/14 1453 Last data filed at 04/28/14 1419  Gross per 24 hour  Intake    240 ml  Output    925 ml  Net   -685 ml    Exam: Gen:  NAD Cardiovascular:  RRR, No M/R/G Respiratory:  Lungs CTAB Gastrointestinal:  Abdomen soft, NT/ND, + BS Extremities:  RLE erythema resolved, palpable purpura BLE unchanged.  See images uploaded to note in H&P and progress note 04/24/14.   Data Reviewed:  Labs: Basic Metabolic Panel:  Recent Labs Lab 04/23/14 0533 04/24/14 0757 04/25/14 0453 04/26/14 0425 04/28/14 0445  NA 132* 132* 131* 132* 133*  K 4.3 4.5 4.6 5.0 4.7  CL 100 100 102 101 101  CO2 _0 GLUCOSE 81 70 82 82 63*  BUN _1 CREATININE 1.19 1.16 1.00 0.89 0.90  CALCIUM 8.1* 8.0* 8.0*  8.0* 8.1*   GFR Estimated Creatinine Clearance: 90.4 ml/min (by C-G formula based on Cr of 0.9). Liver Function Tests:  Recent Labs Lab 04/23/14 0533 04/24/14 0757 04/25/14 0453 04/26/14 0425  AST 58* 50* 54* 59*  ALT _2 ALKPHOS 69 65 66 59  BILITOT 3.1* 2.4* 2.3* 2.5*  PROT 5.2* 5.2* 4.8* 5.1*  ALBUMIN 2.3* 2.2* 2.0* 2.1*   Coagulation profile  Recent Labs Lab 04/21/14 1700 04/22/14 0951  INR 1.89* 1.77*    CBC:  Recent Labs Lab 04/22/14 0951 04/24/14 0757 04/25/14 0453 04/26/14 0425 04/28/14 0445  WBC 4.8 2.2* 1.8* 2.2* 1.4*  NEUTROABS  --   --   --  1.0* 0.5*  HGB 9.8* 8.8* 7.7* 8.7* 7.6*  HCT 27.4* 24.9* 21.6* 25.1* 22.1*  MCV 92.9 93.3 93.1 96.2 96.9  PLT 36* 32* 26* 40* 23*   Cardiac Enzymes:  Recent Labs Lab 04/21/14 1700 04/21/14 2229 04/22/14 0530 04/23/14 0533  TROPONINI 0.65* 0.73* 0.44* <0.30   Sepsis Labs:  Recent Labs Lab 04/23/14 0534 04/24/14 0757 04/25/14 0453 04/26/14 0425 04/28/14 0445  WBC  --  2.2* 1.8* 2.2* 1.4*  LATICACIDVEN 1.2  --   --   --   --    Microbiology No results found for this or any previous visit (from the past 240 hour(s)).   Radiographs/Studies:   Dg Tibia/fibula Right  04/21/2014   CLINICAL DATA:  Fall.  EXAM: RIGHT TIBIA AND FIBULA - 2 VIEW  COMPARISON:  None.  FINDINGS: No acute bony or joint abnormality identified. No fracture or dislocation. Peripheral vascular calcification present.  IMPRESSION: No acute bony or joint abnormality.   Electronically Signed   By: Marcello Moores  Register   On: 04/21/2014 13:08   Dg Foot Complete Right  03/31/2014   CLINICAL DATA:  right foot pain  EXAM: RIGHT FOOT COMPLETE - 3+ VIEW  COMPARISON:  None.  FINDINGS: There is no evidence of fracture or dislocation. There is no evidence of arthropathy or other focal bone abnormality. Soft tissues are unremarkable.  IMPRESSION: Negative.   Electronically Signed   By: Margaree Mackintosh M.D.   On: 03/31/2014 14:22   No  results found. Medications:   . sodium chloride  250 mL Intravenous Once  . acetaminophen  650 mg Oral Once  . ALPRAZolam  0.5 mg Oral TID  . aspirin  81 mg Oral Daily  . benazepril  40 mg Oral Daily  . diphenhydrAMINE  25 mg Oral Once  . doxycycline  100 mg Oral Q12H  . FLUoxetine  20 mg Oral Daily  . folic acid  1 mg Oral Daily  . hydrocerin   Topical BID  . hydrochlorothiazide  25 mg Oral Daily  . thiamine  100 mg Oral Daily  . zolpidem  10 mg Oral QHS   Continuous Infusions:    Time spent: 30 minutes.    LOS: 7 days   Modena Jansky, MD, FACP, [facility]. [facility] Pager [phone]  If 7PM-7AM, please contact night-coverage [email or web address] Password [facility] 04/28/2014, 2:53 PM  04/28/2014, 2:53 PM    **Disclaimer: This note was dictated with voice recognition software. Similar sounding words can inadvertently be transcribed and this note may contain transcription errors which may not have been corrected upon publication of note.**

## 2014-04-28 NOTE — Progress Notes (Signed)
Jeffrey Snow   DOB:11/29/1956   UG#:[id number]   UUE#:[id number]  Subjective: patient is in bed, watching television. He has no complaints. He asks no questions regarding his diagnosis even after I remind him he has been found to have hepatocellular carcinoma. Jeffrey Snow, academic in room   Objective: middle aged White man exmained in bed Filed Vitals:   04/28/14 0607  BP: 120/66  Pulse: 68  Temp: 98.8 F (37.1 C)  Resp: 16    Body mass index is 28.19 kg/(m^2).  Intake/Output Summary (Last 24 hours) at 04/28/14 1346 Last data filed at 04/28/14 0606  Gross per 24 hour  Intake    240 ml  Output    825 ml  Net   -585 ml     Lungs clear -- examined anterolaterally  Heart regular rate and rhythm  Abdomen  Soft, +BS  Neuro nonfocal, affect inappropriately indifferent   CBG (last 3)  No results found for this basename: GLUCAP,  in the last 72 hours   Labs:  Lab Results  Component Value Date   WBC 1.4* 04/28/2014   HGB 7.6* 04/28/2014   HCT 22.1* 04/28/2014   MCV 96.9 04/28/2014   PLT 23* 04/28/2014   NEUTROABS 0.5* 04/28/2014    '@LASTCHEMISTRY' @  Urine Studies No results found for this basename: UACOL, UAPR, USPG, UPH, UTP, UGL, UKET, UBIL, UHGB, UNIT, UROB, ULEU, UEPI, UWBC, URBC, UBAC, CAST, CRYS, UCOM, BILUA,  in the last 72 hours  Basic Metabolic Panel:  Recent Labs Lab 04/23/14 0533 04/24/14 0757 04/25/14 0453 04/26/14 0425 04/28/14 0445  NA 132* 132* 131* 132* 133*  K 4.3 4.5 4.6 5.0 4.7  CL 100 100 102 101 101  CO2 '23 21 20 19 20  ' GLUCOSE 81 70 82 82 63*  BUN '16 18 14 13 13  ' CREATININE 1.19 1.16 1.00 0.89 0.90  CALCIUM 8.1* 8.0* 8.0* 8.0* 8.1*   GFR Estimated Creatinine Clearance: 90.4 ml/min (by C-G formula based on Cr of 0.9). Liver Function Tests:  Recent Labs Lab 04/23/14 0533 04/24/14 0757 04/25/14 0453 04/26/14 0425  AST 58* 50* 54* 59*  ALT '31 30 29 30  ' ALKPHOS 69 65 66 59  BILITOT 3.1* 2.4* 2.3* 2.5*  PROT 5.2* 5.2* 4.8* 5.1*   ALBUMIN 2.3* 2.2* 2.0* 2.1*   No results found for this basename: LIPASE, AMYLASE,  in the last 168 hours No results found for this basename: AMMONIA,  in the last 168 hours Coagulation profile  Recent Labs Lab 04/21/14 1700 04/22/14 0951  INR 1.89* 1.77*    CBC:  Recent Labs Lab 04/22/14 0951 04/24/14 0757 04/25/14 0453 04/26/14 0425 04/28/14 0445  WBC 4.8 2.2* 1.8* 2.2* 1.4*  NEUTROABS  --   --   --  1.0* 0.5*  HGB 9.8* 8.8* 7.7* 8.7* 7.6*  HCT 27.4* 24.9* 21.6* 25.1* 22.1*  MCV 92.9 93.3 93.1 96.2 96.9  PLT 36* 32* 26* 40* 23*   Cardiac Enzymes:  Recent Labs Lab 04/21/14 1700 04/21/14 2229 04/22/14 0530 04/23/14 0533  TROPONINI 0.65* 0.73* 0.44* <0.30   BNP: No components found with this basename: POCBNP,  CBG: No results found for this basename: GLUCAP,  in the last 168 hours D-Dimer No results found for this basename: DDIMER,  in the last 72 hours Hgb A1c No results found for this basename: HGBA1C,  in the last 72 hours Lipid Profile No results found for this basename: CHOL, HDL, LDLCALC, TRIG, CHOLHDL, LDLDIRECT,  in the last 72 hours  Thyroid function studies No results found for this basename: TSH, T4TOTAL, FREET3, T3FREE, THYROIDAB,  in the last 72 hours Anemia work up No results found for this basename: VITAMINB12, FOLATE, FERRITIN, TIBC, IRON, RETICCTPCT,  in the last 72 hours Microbiology No results found for this or any previous visit (from the past 240 hour(s)).    Studies:  No results found.  Assessment: 58 y.o. Jeffrey Snow man with a Right liver lesion first documented January 2014, measuring 1.6 cm by abdminal MRI 01/30/2013, now measuring 3.2 cm (03/26/2014), in the setting of cirrhosis and hepatitis C positivity, with and AFP of 75 (04/21/2014)  (1) hepatocellular carcinoma -- not a surgical candidate per Dr Barry Dienes; consider ablation per IR (2) pancytopenia, most likely secondary to marrow suppression from ETOH, but consider MDS (3)  thrombocytopenia also secondary to splenomegaly, +/- ITP,  (4) consider cryoglobulinemia as source of rash (5) consider [name]/Korsakoff dementia  Plan: I discussed the patient's situation in detail today with him and his brother Jeffrey Snow. The patient has a clinical diagnosis of hepatoma. Surgical ablation not advisable per Dr Barry Dienes but transcatheter (chemo)/embolization may be feasible per IR. Clearly this cannot be done unless the platelet count recovers. This may be just a matter of time if the main reason is ETOH abuse, but there may be an element of ITP and to evaluate this most directly we will transfuse a unit of platelets today and recheck a platelet count 1 hour post transfusion. A sustained "bump" would make ITP unlikely. In that case choice is waiting a few more days for marrow recovery or proceeding with bone marrow biopsy to evaluate for myelodysplasia (I would favor waiting).  If there is no "bump" then I would start the patient on prednisone 60 mg po daily and we can follow in outpatient clinic.  I would suggest psychiatric evaluation for dementia, as it is not clear to me to what extent Mr Jeffrey Snow can participate in decision making regarding his care. I urged him today to get a HCPOA declared and notarized. Also discussed the overall situation with his brother, who would like to meet with SW to discuss what kind of support can be obtained for Jeffrey Snow (including transportation and home care if possible).  Jeffrey Snow' phone number is [phone], the other brother, Jeffrey Snow, can be reached at [phone] or [phone]   I will be out of town this weekend but will be back Monday. If patient is discharged before then I wil arrange follow-up with me after the embolization procedure Jeffrey Cruel, MD 04/28/2014  1:46 PM

## 2014-04-28 NOTE — Progress Notes (Signed)
nt and nurse in room, nt getting vitals nurse going to hang platelets, nurse and tech talked with patient about both helping with bathing. Pt assitsed that he wasn't taking a bath today.

## 2014-04-28 NOTE — Progress Notes (Signed)
CRITICAL VALUE ALERT  Critical value received:  plt 23, wbc 1.4  Date of notification:  5/14  Time of notification:  0600  Critical value read back:yes  Nurse who received alert:  Edwyna Ready RN  MD notified (1st page):  [email or web address]  Time of first page:  0608  MD notified (2nd page):  Time of second page:  Responding MD:    Time MD responded:

## 2014-04-28 NOTE — Progress Notes (Signed)
Patient was seen by Dr. Laurence Ferrari today. Outpatient consult for possible percutaneous tumor ablation or transcatheter embolization/chemoembolization has been scheduled 05/04/14 at 10:15am. IR will continue to follow the patient's clinical status and await plt transfusion results.   Tsosie Billing PA-C Interventional Radiology  04/28/14  4:41 PM

## 2014-04-28 NOTE — Progress Notes (Signed)
Physical Therapy Treatment Patient Details Name: Jeffrey Snow MRN: [id number] DOB: 06/22/1956 Today's Date: 04/28/2014    History of Present Illness 58 yo male adm after multiple falls at home and LE pain/rash; PMH of peripheral neuropathy, hypertension, peripheral arterial disease/arterial insufficiency with chronic intermittent claudication, dopplers negative    PT Comments    Assisted pt OOB to amb in hallway twice with one sitting rest break.    Follow Up Recommendations  SNF     Equipment Recommendations  None recommended by PT    Recommendations for Other Services       Precautions / Restrictions Precautions Precautions: Fall Restrictions Weight Bearing Restrictions: No    Mobility  Bed Mobility Overal bed mobility: Needs Assistance Bed Mobility: Supine to Sit;Sit to Supine     Supine to sit: Min guard;Supervision Sit to supine: Min guard;Supervision   General bed mobility comments: increased time and MAX VC's to increase self assist and motivate to participate  Transfers                 General transfer comment: 25% VC's on proper hand placement and to incease self assist level.    Ambulation/Gait Ambulation/Gait assistance: Min assist;Mod assist Ambulation Distance (Feet): 88 Feet Assistive device: Rolling walker (2 wheeled) Gait Pattern/deviations: Step-to pattern;Decreased stance time - right;Trunk flexed Gait velocity: decreased   General Gait Details: Very unsteady gait and required MAX encouragement to increase amb distance.     Stairs            Wheelchair Mobility    Modified Rankin (Stroke Patients Only)       Balance                                    Cognition                            Exercises      General Comments        Pertinent Vitals/Pain C/o R LE pain pre medicated    Home Living                      Prior Function            PT Goals (current goals can  now be found in the care plan section)      Frequency  Min 3X/week    PT Plan      Co-evaluation             End of Session Equipment Utilized During Treatment: Gait belt Activity Tolerance: Patient tolerated treatment well Patient left: in bed;with bed alarm set;with call bell/phone within reach     Time: 5053-9767 PT Time Calculation (min): 25 min  Charges:  $Gait Training: 8-22 mins $Therapeutic Activity: 8-22 mins                    G Codes:      Rica Koyanagi  PTA [facility]  Acute  Rehab Pager      [phone]

## 2014-04-28 NOTE — Progress Notes (Signed)
nt stated to pt that he needed assitsed in bathing and plan to assit pt with bathing. Pt contuined to recline. Nt enformed Nurse.

## 2014-04-29 ENCOUNTER — Other Ambulatory Visit: Payer: Self-pay | Admitting: Oncology

## 2014-04-29 DIAGNOSIS — F329 Major depressive disorder, single episode, unspecified: Secondary | ICD-10-CM

## 2014-04-29 DIAGNOSIS — F3289 Other specified depressive episodes: Secondary | ICD-10-CM

## 2014-04-29 LAB — COMPREHENSIVE METABOLIC PANEL
ALT: 30 U/L (ref 0–53)
AST: 63 U/L — ABNORMAL HIGH (ref 0–37)
Albumin: 2.1 g/dL — ABNORMAL LOW (ref 3.5–5.2)
Alkaline Phosphatase: 66 U/L (ref 39–117)
BUN: 14 mg/dL (ref 6–23)
CO2: 22 mEq/L (ref 19–32)
Calcium: 8.3 mg/dL — ABNORMAL LOW (ref 8.4–10.5)
Chloride: 100 mEq/L (ref 96–112)
Creatinine, Ser: 0.95 mg/dL (ref 0.50–1.35)
GFR calc Af Amer: >90 mL/min (ref >90)
GFR calc non Af Amer: 89 mL/min — ABNORMAL LOW (ref >90)
Glucose, Bld: 86 mg/dL (ref 70–99)
Potassium: 4.5 mEq/L (ref 3.7–5.3)
Sodium: 131 mEq/L — ABNORMAL LOW (ref 137–147)
Total Bilirubin: 2.2 mg/dL — ABNORMAL HIGH (ref 0.3–1.2)
Total Protein: 4.8 g/dL — ABNORMAL LOW (ref 6.0–8.3)

## 2014-04-29 LAB — CBC WITH DIFFERENTIAL/PLATELET
Basophils Absolute: 0 10*3/uL (ref 0.0–0.1)
Basophils Relative: 1 % (ref 0–1)
Eosinophils Absolute: 0 10*3/uL (ref 0.0–0.7)
Eosinophils Relative: 2 % (ref 0–5)
HCT: 21.6 % — ABNORMAL LOW (ref 39.0–52.0)
Hemoglobin: 7.8 g/dL — ABNORMAL LOW (ref 13.0–17.0)
Lymphocytes Relative: 45 % (ref 12–46)
Lymphs Abs: 0.6 10*3/uL — ABNORMAL LOW (ref 0.7–4.0)
MCH: 34.2 pg — ABNORMAL HIGH (ref 26.0–34.0)
MCHC: 36.1 g/dL — ABNORMAL HIGH (ref 30.0–36.0)
MCV: 94.7 fL (ref 78.0–100.0)
Monocytes Absolute: 0.3 10*3/uL (ref 0.1–1.0)
Monocytes Relative: 22 % — ABNORMAL HIGH (ref 3–12)
Neutro Abs: 0.4 10*3/uL — ABNORMAL LOW (ref 1.7–7.7)
Neutrophils Relative %: 30 % — ABNORMAL LOW (ref 43–77)
Platelets: 47 10*3/uL — ABNORMAL LOW (ref 150–400)
RBC: 2.28 MIL/uL — ABNORMAL LOW (ref 4.22–5.81)
RDW: 17.2 % — ABNORMAL HIGH (ref 11.5–15.5)
WBC: 1.3 10*3/uL — CL (ref 4.0–10.5)

## 2014-04-29 LAB — PREPARE PLATELET PHERESIS: Unit division: 0

## 2014-04-29 MED ORDER — ENSURE COMPLETE PO LIQD
237.0000 mL | ORAL | Status: DC
  Administered 2014-04-30 – 2014-05-01 (×2): 237 mL via ORAL

## 2014-04-29 NOTE — Progress Notes (Signed)
Progress Note   Jeffrey Snow QDI:[id number] DOB: December 07, 1956 DOA: 04/21/2014 PCP: [name],[name], MD   Brief Narrative:   Jeffrey Snow is an 58 y.o. male with a PMH of peripheral neuropathy, hypertension, peripheral arterial disease/arterial insufficiency with chronic intermittent claudication status post arteriogram which showed a right distal SFA occlusion with essentially hypertrophied collaterals from the R profunda femoral artery which constitute a bypass of the R distal SFA occlusion, L popliteal artery is occluded at the knee but numerous geniculate arteries bypass the short segment occlusion, who is under the care of Dr. Bridgett Larsson of vascular surgery who recommended conservative care of his arterial disease with maximal medical management including strict control of blood pressure, blood glucose and lipids, regular exercise, smoking cessation. Dr. Bridgett Larsson did not think the patient's anesthesia/neuropathy symptoms were from his peripheral arterial disease because of his hypertrophic collaterals.  He was admitted 04/21/14 with right leg/foot pain, weakness, and falls. He also has a worsening rash with petechiae.  Plan was initially to obtain biopsy of his liver lesion while in the hospital, but IR felt he was high-risk for this given his thrombocytopenia, and recommended oncology consultation. The patient was seen by Dr. Jana Hakim feels that his presentation is likely consistent with hepatocellular carcinoma, and has placed a surgical consultation.  Assessment/Plan:   Principal Problem:  Cellulitis RLE / Pain in limb / rash and nonspecific skin eruption in a patient with a history of peripheral artery disease/arterial insufficiency/atherosclerosis of native arteries of the extremities with intermittent claudication   Dr. Rockne Menghini discussed with Dr. Bridgett Larsson of vascular surgery 04/21/14 who reviewed the images above. According to Dr. Vallarie Mare, he had a rash similar to the one on his left leg at the  time of the arteriogram. His right leg is worse. He does not feel this is related to the arteriogram and confirms that the patient has good collateral blood flow.   Continue aspirin. Lipids OK.   Followup cryoglobulins to rule out cryoglobulinemia. ESR WNL making vasculitis unlikely. DIC panel negative for schistocytes. HIV nonreactive. Hepatitis C viral load with a log of 5.49.  Dopplers negative for DVT(elevated d-dimer).  The patient has bilateral LE purpura, with superimposed cellulitis on the right, which is being managed with vancomycin. Improved- transitioned to PO Doxycycline on 5/13.  Continue Vicodin as needed for pain control. Avoid IV narcotics.  As per oncology, patient's skin rash was felt to be due to secondary to cryoglobulinemia. However negative cryoglobulin test and hence rash probably not due to cryoglobulinemia. Active Problems:  Pancytopenia  Patient has had a significant deterioration in his platelet count which was 115 on 10/12/12.  His WBC and hemoglobin are also low, possibly from bone marrow suppression/liver disease.  As per oncology, pancytopenia most likely secondary to marrow suppression from alcohol abuse, but consider MDS. Thrombocytopenia could also be secondary to splenomegaly, +/- ITP. Please refer to detailed oncology note for 5/14 regarding management. Trial of platelet transfusion on 5/14  Transfuse PRBCs if hemoglobin is less than 7 g per DL. Leukopenia/neutropenia but no fevers-will not change antibiotic regimen.  Discontinue the aspirin due to severe thrombocytopenia and high risk for bleeding.  Patient received a unit of platelet transfusion on 5/14 and as per oncology, platelet count improved posttransfusion and hence not suggestive of ITP but more consistent with bone marrow suppression by EtOH. Continue to follow CBCs. Depression   Continue Xanax and Prozac.  Psychiatry reevaluated on 5/15 and have determined that he has capacity  to make  medical decisions. Fall   Physical therapy evaluation performed 04/22/14: SNF recommended, however patient has refused even though he cannot walk. PT reevaluated on 5/12 with same recommendations. HTN (hypertension)   Continue Lotensin and hydrochlorothiazide. Normocytic anemia   Anemia panel shows a ferritin of 671, folate 9.7, B12 877, iron 65, TIBC 165, 39% saturation.  Anemia likely from bone marrow suppression from ETOH and chronic disease. Cirrhosis, alcoholic / coagulopathy   Changes of mild cirrhosis noted on MRI of the abdomen done 01/06/13. Known history of heavy alcohol use in the past.   History of polysubstance abuse in the past. Urine drug screen positive for benzos (on Xanax), HIV nonreactive.   Coagulopathy likely related to cirrhosis. Hyponatremia   Due to dehydration and HCTZ.Stable. DC IVF. Hypokalemia   Resolved with potassium added to IV fluids. Metabolic acidosis   Mild elevation of serum lactate noted. Resolved with hydration. Hepatitis C / elevated AFP tumor marker / Abnormal MRI of abdomen, liver/clinical diagnosis of hepatoma  Right liver lesion, increasing in size since January, in the setting of cirrhosis and hepatitis C positivity, elevated AFP of 75 supporting clinical diagnosis of hepatoma.  Please referred to detailed oncology note for 5/14.  Surgical ablation not advisable by surgery but transcatheter embolization may be feasible by IR. This can be done only after platelet counts improved. Management of thrombocytopenia as stated above. Interventional radiology consultation appreciated and plan intervention as OP T wave inversion in EKG / Elevated troponins   No complaints of chest pain. Troponins elevated, but declining. Continue aspirin.  2-D echocardiogram normal.  No hyperlipidemia.  Re-check 12 lead EKG 04/22/14 shows persistent T wave inversions in V1/V2.  Troponin now normal.  May consider OP ischemic eval i.e stress test, unless he  undergoes IP surgery etc- then will call for preop evaluation. DVT prophylaxis   Heparin/Lovenox contraindicated given thrombocytopenia, and with claudication, would not use SCDs.  Code Status: Full. Family Communication: discussed with brother Mr. Mariusz Jubb on 5/14, updated care and answered questions Disposition Plan: Home vs. SNF when stable.   IV Access:    Peripheral IV   Procedures:    2 D Echo 04/22/14: EF 60-65%, no regional wall motion abnormalities.  Left ventricular diastolic function parameters were normal.   Medical Consultants:    Interventional radiology  Dr. Lurline Del, Oncology.  General Surgery  Psychiatry   Other Consultants:    PT   Anti-Infectives:    Vancomycin 5/10>  PO Doxycycline 5/13>  Subjective:   Braden Cimo Sulkowski denied complaints this morning.  Objective:    Filed Vitals:   04/28/14 1815 04/28/14 2122 04/29/14 0646 04/29/14 1340  BP: 126/72 107/67 123/71 135/76  Pulse: 68 65 70 71  Temp: 97.5 F (36.4 C) 98.6 F (37 C) 98.3 F (36.8 C) 98.2 F (36.8 C)  TempSrc: Oral Axillary Oral Oral  Resp: '16 18 18 18  ' Height:      Weight:      SpO2: 97% 97% 97% 96%    Intake/Output Summary (Last 24 hours) at 04/29/14 1622 Last data filed at 04/29/14 0617  Gross per 24 hour  Intake  262.5 ml  Output    650 ml  Net -387.5 ml    Exam: Gen:  NAD Cardiovascular:  RRR, No M/R/G Respiratory:  Lungs CTAB Gastrointestinal:  Abdomen soft, NT/ND, + BS Extremities:  RLE erythema resolved, palpable purpura BLE unchanged.  See images uploaded to note in H&P and progress note 04/24/14.  Data Reviewed:    Labs: Basic Metabolic Panel:  Recent Labs Lab 04/25/14 0453 04/26/14 0425 04/28/14 0445 04/28/14 1430 04/29/14 0700  NA 131* 132* 133* 131* 131*  K 4.6 5.0 4.7 4.8 4.5  CL 102 101 101 99 100  CO2 '20 19 20 20 22  ' GLUCOSE 82 82 63* 69* 86  BUN '14 13 13 13 14  ' CREATININE 1.00 0.89 0.90 0.92 0.95    CALCIUM 8.0* 8.0* 8.1* 8.3* 8.3*   GFR Estimated Creatinine Clearance: 85.6 ml/min (by C-G formula based on Cr of 0.95). Liver Function Tests:  Recent Labs Lab 04/24/14 0757 04/25/14 0453 04/26/14 0425 04/28/14 1430 04/29/14 0700  AST 50* 54* 59* 64* 63*  ALT '30 29 30 30 30  ' ALKPHOS 65 66 59 58 66  BILITOT 2.4* 2.3* 2.5* 2.4* 2.2*  PROT 5.2* 4.8* 5.1* 5.4* 4.8*  ALBUMIN 2.2* 2.0* 2.1* 2.3* 2.1*   Coagulation profile  Recent Labs Lab 04/28/14 1356 04/28/14 2214  INR 1.85* 1.96*    CBC:  Recent Labs Lab 04/25/14 0453 04/26/14 0425 04/28/14 0445 04/28/14 1356 04/28/14 1430 04/28/14 2214 04/29/14 0700  WBC 1.8* 2.2* 1.4*  --  1.5*  --  1.3*  NEUTROABS  --  1.0* 0.5*  --  0.6*  --  0.4*  HGB 7.7* 8.7* 7.6*  --  8.5*  --  7.8*  HCT 21.6* 25.1* 22.1*  --  24.2*  --  21.6*  MCV 93.1 96.2 96.9  --  95.3  --  94.7  PLT 26* 40* 23* 36* 33* 40* 47*   Cardiac Enzymes:  Recent Labs Lab 04/23/14 0533  TROPONINI <0.30   Sepsis Labs:  Recent Labs Lab 04/23/14 0534  04/26/14 0425 04/28/14 0445 04/28/14 1430 04/29/14 0700  WBC  --   < > 2.2* 1.4* 1.5* 1.3*  LATICACIDVEN 1.2  --   --   --   --   --   < > = values in this interval not displayed. Microbiology No results found for this or any previous visit (from the past 240 hour(s)).   Radiographs/Studies:   Dg Tibia/fibula Right  04/21/2014   CLINICAL DATA:  Fall.  EXAM: RIGHT TIBIA AND FIBULA - 2 VIEW  COMPARISON:  None.  FINDINGS: No acute bony or joint abnormality identified. No fracture or dislocation. Peripheral vascular calcification present.  IMPRESSION: No acute bony or joint abnormality.   Electronically Signed   By: Marcello Moores  Register   On: 04/21/2014 13:08   Dg Foot Complete Right  03/31/2014   CLINICAL DATA:  right foot pain  EXAM: RIGHT FOOT COMPLETE - 3+ VIEW  COMPARISON:  None.  FINDINGS: There is no evidence of fracture or dislocation. There is no evidence of arthropathy or other focal bone  abnormality. Soft tissues are unremarkable.  IMPRESSION: Negative.   Electronically Signed   By: Margaree Mackintosh M.D.   On: 03/31/2014 14:22   No results found. Medications:   . ALPRAZolam  0.5 mg Oral TID  . benazepril  40 mg Oral Daily  . doxycycline  100 mg Oral Q12H  . feeding supplement (ENSURE COMPLETE)  237 mL Oral Q24H  . FLUoxetine  20 mg Oral Daily  . folic acid  1 mg Oral Daily  . hydrocerin   Topical BID  . hydrochlorothiazide  25 mg Oral Daily  . thiamine  100 mg Oral Daily  . zolpidem  10 mg Oral QHS   Continuous Infusions:    Time spent: 30 minutes.  LOS: 8 days   Modena Jansky, MD, FACP, [facility]. [facility] Pager [phone]  If 7PM-7AM, please contact night-coverage [email or web address] Password [email or web address] 04/29/2014, 4:22 PM  04/29/2014, 4:22 PM    **Disclaimer: This note was dictated with voice recognition software. Similar sounding words can inadvertently be transcribed and this note may contain transcription errors which may not have been corrected upon publication of note.**

## 2014-04-29 NOTE — Progress Notes (Signed)
Mr Blaney's platelet count "bounce" after transfusion yesterday evening, the higher value today, and the generally rising trend over the past 48 hours are not suggestive of ITP, but are more consistent with marrow suppression by ETOH. Accordingly I would not start the patient on steroids.  If the patient can refrain from ETOH an additional 10 days or so we will know to what extent his marrow can recover.  I have made Mr Kestenbaum an appontment in our office for 2:45 PM on May 20 for follow-up of his hematologic and hepatocellular carcinoma problems.  Incidentally with a negative cryoglobulin test (assuming the test was appropriately processed) cryoglobulinemia associated with his hepatitis viral positivity is not likely to be the cause of his rash.  Please let us know if we can be of further assistance at this point.

## 2014-04-29 NOTE — Progress Notes (Signed)
INITIAL NUTRITION ASSESSMENT  DOCUMENTATION CODES Per approved criteria  -Not Applicable   INTERVENTION: -Recommend Ensure Complete po Q24H, each supplement provides 350 kcal and 13 grams of protein -Will continue to monitor  NUTRITION DIAGNOSIS: Inadequate oral intake related to decreased appetite as evidenced by PO intake <75%.   Goal: Pt to meet >/= 90% of their estimated nutrition needs    Monitor:  Total protein/energy intake, labs, weights  Reason for Assessment: MST/LOS Day 23  58 y.o. male  Admitting Dx: Pain in limb  ASSESSMENT: Patient was admitted on 04/21/14 with complaints of leg swelling, leg rash and falling several times before admit  -Pt with newly dx liver cancer -Pt reported usual body weight of 150 lbs, did not believe that current weight of 180 lbs was accurate, and attributed it to multiple layers of clothing. Believes he has lost weight in lower extremities, but unable to quantify. -Diet recall indicates pt consume 2-3 meals/day, largely of processed and/or fast foods. Has hx of ETOH abuse and liver cirrhosis, consumes 6 cans of beer/week. Consumed Ensure Complete previously but stopped supplementation as it caused loose stools/gas -Current PO intake during admit varies. Has been eating <75% of some meals d/t dislike of taste, and current diet restriction -Discussed importance of additional kcal/protein during illness. Was willing to consume one Ensure daily.    Height: Ht Readings from Last 1 Encounters:  04/21/14 5\' 7"  (1.702 m)    Weight: Wt Readings from Last 1 Encounters:  04/21/14 180 lb (81.647 kg)    Ideal Body Weight: 148 lbs  % Ideal Body Weight: 122%  Wt Readings from Last 10 Encounters:  04/21/14 180 lb (81.647 kg)  03/31/14 181 lb (82.101 kg)  03/04/14 181 lb (82.101 kg)  03/03/14 182 lb (82.555 kg)  01/27/14 174 lb (78.926 kg)  12/27/13 166 lb (75.297 kg)  12/27/13 166 lb (75.297 kg)  12/20/13 170 lb (77.111 kg)  12/03/13  168 lb (76.204 kg)  10/26/13 168 lb (76.204 kg)    Usual Body Weight: 150 lbs  % Usual Body Weight: 120%  BMI:  Body mass index is 28.19 kg/(m^2).  Estimated Nutritional Needs: Kcal: 2000-2200 Protein: 80-90 gram  Fluid: >/=2000 ml/daily  Skin: Cellulitis RLE, non pitting edema on RLE and LLE  Diet Order: General  EDUCATION NEEDS: -No education needs identified at this time   Intake/Output Summary (Last 24 hours) at 04/29/14 1149 Last data filed at 04/29/14 0617  Gross per 24 hour  Intake  512.5 ml  Output    750 ml  Net -237.5 ml    Last BM: 5/10   Labs:   Recent Labs Lab 04/26/14 0425 04/28/14 0445 04/28/14 1430  NA 132* 133* 131*  K 5.0 4.7 4.8  CL 101 101 99  CO2 19 20 20   BUN 13 13 13   CREATININE 0.89 0.90 0.92  CALCIUM 8.0* 8.1* 8.3*  GLUCOSE 82 63* 69*    CBG (last 3)  No results found for this basename: GLUCAP,  in the last 72 hours  Scheduled Meds: . ALPRAZolam  0.5 mg Oral TID  . benazepril  40 mg Oral Daily  . doxycycline  100 mg Oral Q12H  . FLUoxetine  20 mg Oral Daily  . folic acid  1 mg Oral Daily  . hydrocerin   Topical BID  . hydrochlorothiazide  25 mg Oral Daily  . thiamine  100 mg Oral Daily  . zolpidem  10 mg Oral QHS    Continuous Infusions:  Past Medical History  Diagnosis Date  . Depression   . Anxiety   . Panic attack   . Hypertension   . Hepatitis C   . Claudication of left lower extremity m-1  . Abnormal MRI of abdomen, liver  04/21/2014  . Cirrhosis, alcoholic 06/22/5884    Past Surgical History  Procedure Laterality Date  . Cosmetic surgery      facial reconstructive surgery  . Shelley [place] Clinical Dietitian [phone]

## 2014-04-29 NOTE — Consult Note (Signed)
[facility] Face-to-Face Psychiatry Consult   Reason for Consult:  Capacity Referring Physician:   Dr Bethanne Ginger is an 58 y.o. male. Total Time spent with patient: 20 minutes  Assessment: AXIS I:  Depressive Disorder NOS AXIS II:  Deferred AXIS III:   Past Medical History  Diagnosis Date  . Depression   . Anxiety   . Panic attack   . Hypertension   . Hepatitis C   . Claudication of left lower extremity m-1  . Abnormal MRI of abdomen, liver  04/21/2014  . Cirrhosis, alcoholic 0/07/8109   AXIS IV:  other psychosocial or environmental problems and problems related to social environment AXIS V:  61-70 mild symptoms  Plan:  No evidence of imminent risk to self or others at present.   Patient does not meet criteria for psychiatric inpatient admission. Supportive therapy provided about ongoing stressors. Discussed crisis plan, support from social network, calling 911, coming to the Emergency Department, and calling Suicide Hotline.  Subjective:   Jeffrey Snow is a 58 y.o. male patient admitted with right leg pain.  HPI:  Patient seen chart reviewed.  Patient is 58 year old with history of peripheral neuropathy, hypertension, chronic intermittent claudication and history of alcohol.  Consult was called for capacity as patient is refusing to go to nursing facility.  Patient endorses history of depression for many years and he is taking Prozac 40 mg for more than 40 years.  He was seeing Dr. Verda Cumins who recently retired at [facility].  He is getting treatment from [facility] and he is happy with his current provider.  The patient was admitted because of leg foot pain and there is a history of repeated falls, weakness and there is suspicion from his family that he is drinking but patient denies.  Patient told he quit drinking 3 a half years ago.  He is ongoing liver problems and initially it was decided to get liver biopsy but the cost of pancytopenia it has been  postponed.  Patient was recommended to skilled nursing facility however he refused because of financial reasons.  However patient is open to get physical therapy at his home if it is covered by his insurance and he does not have to pay.  Patient remember very well why he is coming to the hospital however he is not willing to sign something which he cannot afford.  He has some difficulty in his attention and concentration but he is logical to make his own decisions.  Patient reported that he is not drinking for more than 3-1/2 years and his psychiatristvery well.  Patient understand that he has chronic health issues and he is also aware about risk of noncompliance with the medication and treatment.  Patient denies any hallucination, paranoia, active or passive suicidal thoughts and homicidal thoughts.  Patient reported his depression is well controlled on Prozac.  Patient is not combative, agitated , aggressive at this time.  He lives by himself.  He has 2 brothers .  He has 4 children .  He admitted sometime disappointed because they don't come and visit him but he also understands that they are busy.  Patient denies any previous history of psychiatric inpatient treatment or any suicidal attempt.   Past Psychiatric History: Past Medical History  Diagnosis Date  . Depression   . Anxiety   . Panic attack   . Hypertension   . Hepatitis C   . Claudication of left lower extremity m-1  . Abnormal  MRI of abdomen, liver  04/21/2014  . Cirrhosis, alcoholic 03/19/346    reports that he quit smoking about 2 years ago. He has never used smokeless tobacco. He reports that he drinks about 3.6 ounces of alcohol per week. He reports that he does not use illicit drugs. Family History  Problem Relation Age of Onset  . Diabetes Father   . Diabetes Brother   . Diabetes Brother      Living Arrangements: Alone   Abuse/Neglect [facility]) Physical Abuse: Denies Verbal Abuse: Denies Sexual Abuse: Denies Allergies:  No  Known Allergies   Objective: Blood pressure 123/71, pulse 70, temperature 98.3 F (36.8 C), temperature source Oral, resp. rate 18, height _0  (1.702 m), weight 180 lb (81.647 kg), SpO2 97.00%.Body mass index is 28.19 kg/(m^2). Results for orders placed during the hospital encounter of 04/21/14 (from the past 72 hour(s))  CBC WITH DIFFERENTIAL     Status: Abnormal   Collection Time    04/28/14  4:45 AM      Result Value Ref Range   WBC 1.4 (*) 4.0 - 10.5 K/uL   Comment: REPEATED TO VERIFY     CRITICAL RESULT CALLED TO, READ BACK BY AND VERIFIED WITH:     MERRITT,A/3W _1  ON 04/28/14 BY KARCZEWSKI,S.   RBC 2.28 (*) 4.22 - 5.81 MIL/uL   Hemoglobin 7.6 (*) 13.0 - 17.0 g/dL   HCT 22.1 (*) 39.0 - 52.0 %   MCV 96.9  78.0 - 100.0 fL   MCH 33.3  26.0 - 34.0 pg   MCHC 34.4  30.0 - 36.0 g/dL   RDW 16.9 (*) 11.5 - 15.5 %   Platelets 23 (*) 150 - 400 K/uL   Comment: REPEATED TO VERIFY     PLATELET COUNT CONFIRMED BY SMEAR     CRITICAL RESULT CALLED TO, READ BACK BY AND VERIFIED WITH:     MERRITT,A/3W _2  ON 04/28/14 BY KARCZEWSKI,S.   Neutrophils Relative % 39 (*) 43 - 77 %   Lymphocytes Relative 43  12 - 46 %   Monocytes Relative 15 (*) 3 - 12 %   Eosinophils Relative 2  0 - 5 %   Basophils Relative 1  0 - 1 %   Neutro Abs 0.5 (*) 1.7 - 7.7 K/uL   Lymphs Abs 0.7  0.7 - 4.0 K/uL   Monocytes Absolute 0.2  0.1 - 1.0 K/uL   Eosinophils Absolute 0.0  0.0 - 0.7 K/uL   Basophils Absolute 0.0  0.0 - 0.1 K/uL   Smear Review MORPHOLOGY UNREMARKABLE    BASIC METABOLIC PANEL     Status: Abnormal   Collection Time    04/28/14  4:45 AM      Result Value Ref Range   Sodium 133 (*) 137 - 147 mEq/L   Potassium 4.7  3.7 - 5.3 mEq/L   Chloride 101  96 - 112 mEq/L   CO2 20  19 - 32 mEq/L   Glucose, Bld 63 (*) 70 - 99 mg/dL   BUN 13  6 - 23 mg/dL   Creatinine, Ser 0.90  0.50 - 1.35 mg/dL   Calcium 8.1 (*) 8.4 - 10.5 mg/dL   GFR calc non Af Amer >90  >90 mL/min   GFR calc Af Amer >90  >90  mL/min   Comment: (NOTE)     The eGFR has been calculated using the CKD EPI equation.     This calculation has not been validated in all clinical situations.  eGFR's persistently <90 mL/min signify possible Chronic Kidney     Disease.  PATHOLOGIST SMEAR REVIEW     Status: None   Collection Time    04/28/14  4:45 AM      Result Value Ref Range   Path Review Reviewed by Kalman Drape, M.D.     Comment: 05.14.15     PANCYTOPENIA.  ABO/RH     Status: None   Collection Time    04/28/14  5:00 AM      Result Value Ref Range   ABO/RH(D) O POS    DIC (DISSEMINATED INTRAVASCULAR COAGULATION) PANEL     Status: Abnormal   Collection Time    04/28/14  1:56 PM      Result Value Ref Range   Prothrombin Time 20.8 (*) 11.6 - 15.2 seconds   INR 1.85 (*) 0.00 - 1.49   aPTT 37  24 - 37 seconds   Comment:            IF BASELINE aPTT IS ELEVATED,     SUGGEST PATIENT RISK ASSESSMENT     BE USED TO DETERMINE APPROPRIATE     ANTICOAGULANT THERAPY.   Fibrinogen 200 (*) 204 - 475 mg/dL   D-Dimer, Quant 5.86 (*) 0.00 - 0.48 ug/mL-FEU   Comment:            AT THE INHOUSE ESTABLISHED CUTOFF     VALUE OF 0.48 ug/mL FEU,     THIS ASSAY HAS BEEN DOCUMENTED     IN THE LITERATURE TO HAVE     A SENSITIVITY AND NEGATIVE     PREDICTIVE VALUE OF AT LEAST     98 TO 99%.  THE TEST RESULT     SHOULD BE CORRELATED WITH     AN ASSESSMENT OF THE CLINICAL     PROBABILITY OF DVT / VTE.   Platelets 36 (*) 150 - 400 K/uL   Comment: DELTA CHECK NOTED     REPEATED TO VERIFY     POST TRANSFUSION SPECIMEN     SPECIMEN CHECKED FOR CLOTS     PLATELET COUNT CONFIRMED BY SMEAR   Smear Review NO SCHISTOCYTES SEEN    PREPARE PLATELET PHERESIS     Status: None   Collection Time    04/28/14  2:00 PM      Result Value Ref Range   Unit Number [id number]     Blood Component Type PLTPHER LR2     Unit division 00     Status of Unit ISSUED,FINAL     Transfusion Status OK TO TRANSFUSE    COMPREHENSIVE METABOLIC PANEL      Status: Abnormal   Collection Time    04/28/14  2:30 PM      Result Value Ref Range   Sodium 131 (*) 137 - 147 mEq/L   Potassium 4.8  3.7 - 5.3 mEq/L   Chloride 99  96 - 112 mEq/L   CO2 20  19 - 32 mEq/L   Glucose, Bld 69 (*) 70 - 99 mg/dL   BUN 13  6 - 23 mg/dL   Creatinine, Ser 0.92  0.50 - 1.35 mg/dL   Calcium 8.3 (*) 8.4 - 10.5 mg/dL   Total Protein 5.4 (*) 6.0 - 8.3 g/dL   Albumin 2.3 (*) 3.5 - 5.2 g/dL   AST 64 (*) 0 - 37 U/L   ALT 30  0 - 53 U/L   Alkaline Phosphatase 58  39 - 117 U/L   Total Bilirubin  2.4 (*) 0.3 - 1.2 mg/dL   GFR calc non Af Amer >90  >90 mL/min   GFR calc Af Amer >90  >90 mL/min   Comment: (NOTE)     The eGFR has been calculated using the CKD EPI equation.     This calculation has not been validated in all clinical situations.     eGFR's persistently <90 mL/min signify possible Chronic Kidney     Disease.  CBC WITH DIFFERENTIAL     Status: Abnormal   Collection Time    04/28/14  2:30 PM      Result Value Ref Range   WBC 1.5 (*) 4.0 - 10.5 K/uL   RBC 2.54 (*) 4.22 - 5.81 MIL/uL   Hemoglobin 8.5 (*) 13.0 - 17.0 g/dL   HCT 24.2 (*) 39.0 - 52.0 %   MCV 95.3  78.0 - 100.0 fL   MCH 33.5  26.0 - 34.0 pg   MCHC 35.1  30.0 - 36.0 g/dL   RDW 17.0 (*) 11.5 - 15.5 %   Platelets 33 (*) 150 - 400 K/uL   Comment: REPEATED TO VERIFY     SPECIMEN CHECKED FOR CLOTS   Neutrophils Relative % 38 (*) 43 - 77 %   Neutro Abs 0.6 (*) 1.7 - 7.7 K/uL   Lymphocytes Relative 42  12 - 46 %   Lymphs Abs 0.7  0.7 - 4.0 K/uL   Monocytes Relative 15 (*) 3 - 12 %   Monocytes Absolute 0.2  0.1 - 1.0 K/uL   Eosinophils Relative 3  0 - 5 %   Eosinophils Absolute 0.1  0.0 - 0.7 K/uL   Basophils Relative 1  0 - 1 %   Basophils Absolute 0.0  0.0 - 0.1 K/uL  DIC (DISSEMINATED INTRAVASCULAR COAGULATION) PANEL     Status: Abnormal   Collection Time    04/28/14 10:14 PM      Result Value Ref Range   Prothrombin Time 21.7 (*) 11.6 - 15.2 seconds   INR 1.96 (*) 0.00 - 1.49    aPTT 40 (*) 24 - 37 seconds   Comment:            IF BASELINE aPTT IS ELEVATED,     SUGGEST PATIENT RISK ASSESSMENT     BE USED TO DETERMINE APPROPRIATE     ANTICOAGULANT THERAPY.   Fibrinogen 174 (*) 204 - 475 mg/dL   D-Dimer, Quant 4.32 (*) 0.00 - 0.48 ug/mL-FEU   Comment:            AT THE INHOUSE ESTABLISHED CUTOFF     VALUE OF 0.48 ug/mL FEU,     THIS ASSAY HAS BEEN DOCUMENTED     IN THE LITERATURE TO HAVE     A SENSITIVITY AND NEGATIVE     PREDICTIVE VALUE OF AT LEAST     98 TO 99%.  THE TEST RESULT     SHOULD BE CORRELATED WITH     AN ASSESSMENT OF THE CLINICAL     PROBABILITY OF DVT / VTE.   Platelets 40 (*) 150 - 400 K/uL   Comment: CONSISTENT WITH PREVIOUS RESULT   Smear Review NO SCHISTOCYTES SEEN    CBC WITH DIFFERENTIAL     Status: Abnormal   Collection Time    04/29/14  7:00 AM      Result Value Ref Range   WBC 1.3 (*) 4.0 - 10.5 K/uL   Comment: REPEATED TO VERIFY     CRITICAL RESULT CALLED TO,  READ BACK BY AND VERIFIED WITH:     K. RYAN RN AT 0720 ON 05.15.15 BY SHUEA   RBC 2.28 (*) 4.22 - 5.81 MIL/uL   Hemoglobin 7.8 (*) 13.0 - 17.0 g/dL   HCT 21.6 (*) 39.0 - 52.0 %   MCV 94.7  78.0 - 100.0 fL   MCH 34.2 (*) 26.0 - 34.0 pg   MCHC 36.1 (*) 30.0 - 36.0 g/dL   RDW 17.2 (*) 11.5 - 15.5 %   Platelets 47 (*) 150 - 400 K/uL   Comment: CONSISTENT WITH PREVIOUS RESULT   Neutrophils Relative % 30 (*) 43 - 77 %   Neutro Abs 0.4 (*) 1.7 - 7.7 K/uL   Lymphocytes Relative 45  12 - 46 %   Lymphs Abs 0.6 (*) 0.7 - 4.0 K/uL   Monocytes Relative 22 (*) 3 - 12 %   Monocytes Absolute 0.3  0.1 - 1.0 K/uL   Eosinophils Relative 2  0 - 5 %   Eosinophils Absolute 0.0  0.0 - 0.7 K/uL   Basophils Relative 1  0 - 1 %   Basophils Absolute 0.0  0.0 - 0.1 K/uL   Smear Review WHITE COUNT CONFIRMED ON SMEAR     Labs are reviewed.  Current Facility-Administered Medications  Medication Dose Route Frequency Provider Last Rate Last Dose  . acetaminophen (TYLENOL) tablet 650 mg  650  mg Oral Q6H PRN Venetia Maxon Rama, MD       Or  . acetaminophen (TYLENOL) suppository 650 mg  650 mg Rectal Q6H PRN Venetia Maxon Rama, MD      . ALPRAZolam Duanne Moron) tablet 0.5 mg  0.5 mg Oral TID Venetia Maxon Rama, MD   0.5 mg at 04/29/14 1010  . alum & mag hydroxide-simeth (MAALOX/MYLANTA) 200-200-20 MG/5ML suspension 30 mL  30 mL Oral Q6H PRN Christina P Rama, MD      . benazepril (LOTENSIN) tablet 40 mg  40 mg Oral Daily Venetia Maxon Rama, MD   40 mg at 04/29/14 1010  . doxycycline (VIBRA-TABS) tablet 100 mg  100 mg Oral Q12H Modena Jansky, MD   100 mg at 04/29/14 1010  . FLUoxetine (PROZAC) capsule 20 mg  20 mg Oral Daily Venetia Maxon Rama, MD   20 mg at 88/50/27 7412  . folic acid (FOLVITE) tablet 1 mg  1 mg Oral Daily Venetia Maxon Rama, MD   1 mg at 04/29/14 1010  . guaiFENesin-dextromethorphan (ROBITUSSIN DM) 100-10 MG/5ML syrup 5 mL  5 mL Oral Q4H PRN Venetia Maxon Rama, MD   5 mL at 04/25/14 1256  . heparin lock flush 100 unit/mL  500 Units Intracatheter Daily PRN Chauncey Cruel, MD      . heparin lock flush 100 unit/mL  250 Units Intracatheter PRN Chauncey Cruel, MD      . hydrocerin (EUCERIN) cream   Topical BID Venetia Maxon Rama, MD   1 application at 87/86/76 1018  . hydrochlorothiazide (HYDRODIURIL) tablet 25 mg  25 mg Oral Daily Venetia Maxon Rama, MD   25 mg at 04/29/14 1010  . HYDROcodone-acetaminophen (NORCO/VICODIN) 5-325 MG per tablet 1 tablet  1 tablet Oral Q6H PRN Modena Jansky, MD   1 tablet at 04/29/14 1010  . hydrocortisone (ANUSOL-HC) 2.5 % rectal cream 1 application  1 application Rectal BID PRN Venetia Maxon Rama, MD      . ondansetron (ZOFRAN) tablet 4 mg  4 mg Oral Q6H PRN Tonye Royalty, MD  Or  . ondansetron (ZOFRAN) injection 4 mg  4 mg Intravenous Q6H PRN Christina P Rama, MD      . sodium chloride 0.9 % injection 10 mL  10 mL Intracatheter PRN Chauncey Cruel, MD      . sodium chloride 0.9 % injection 3 mL  3 mL Intracatheter PRN Chauncey Cruel, MD       . thiamine (VITAMIN B-1) tablet 100 mg  100 mg Oral Daily Venetia Maxon Rama, MD   100 mg at 04/29/14 1010  . zolpidem (AMBIEN) tablet 10 mg  10 mg Oral QHS Venetia Maxon Rama, MD   10 mg at 04/28/14 2132    Psychiatric Specialty Exam:     Blood pressure 123/71, pulse 70, temperature 98.3 F (36.8 C), temperature source Oral, resp. rate 18, height _0  (1.702 m), weight 180 lb (81.647 kg), SpO2 97.00%.Body mass index is 28.19 kg/(m^2).  General Appearance: Casual  Eye Contact::  Fair  Speech:  Slow  Volume:  Normal  Mood:  Anxious and Dysphoric  Affect:  Congruent  Thought Process:  Goal Directed and Logical  Orientation:  Full (Time, Place, and Person)  Thought Content:  Rumination  Suicidal Thoughts:  No  Homicidal Thoughts:  No  Memory:  Immediate;   Fair Recent;   Fair Remote;   Fair  Judgement:  Intact  Insight:  Fair  Psychomotor Activity:  Decreased  Concentration:  Fair  Recall:  [employer] of Knowledge:Fair  Language: Fair  Akathisia:  No  Handed:  Right  AIMS (if indicated):     Assets:  Communication Skills Desire for Improvement Housing Social Support  Sleep:      Musculoskeletal: Strength & Muscle Tone: Unable to assess Gait & Station: Unable to assess Patient leans: N/A  Treatment Plan Summary: Patient does have capacity to participate in his treatment plan. he understand his chronic health issues.  At this time he cannot afford the skilled nursing facility because of financial reasons however willing to get in house physical therapy.  Patient does not require inpatient psychiatric services.  Please call [phone] if you have any further questions  Jeffrey Snow 04/29/2014 11:38 AM

## 2014-04-30 ENCOUNTER — Telehealth: Payer: Self-pay | Admitting: Oncology

## 2014-04-30 LAB — CBC WITH DIFFERENTIAL/PLATELET
Basophils Absolute: 0 10*3/uL (ref 0.0–0.1)
Basophils Relative: 1 % (ref 0–1)
Eosinophils Absolute: 0 10*3/uL (ref 0.0–0.7)
Eosinophils Relative: 2 % (ref 0–5)
HCT: 21.4 % — ABNORMAL LOW (ref 39.0–52.0)
Hemoglobin: 7.4 g/dL — ABNORMAL LOW (ref 13.0–17.0)
Lymphocytes Relative: 50 % — ABNORMAL HIGH (ref 12–46)
Lymphs Abs: 0.7 10*3/uL (ref 0.7–4.0)
MCH: 33.6 pg (ref 26.0–34.0)
MCHC: 34.6 g/dL (ref 30.0–36.0)
MCV: 97.3 fL (ref 78.0–100.0)
Monocytes Absolute: 0.2 10*3/uL (ref 0.1–1.0)
Monocytes Relative: 13 % — ABNORMAL HIGH (ref 3–12)
Neutro Abs: 0.4 10*3/uL — ABNORMAL LOW (ref 1.7–7.7)
Neutrophils Relative %: 34 % — ABNORMAL LOW (ref 43–77)
Platelets: 44 10*3/uL — ABNORMAL LOW (ref 150–400)
RBC: 2.2 MIL/uL — ABNORMAL LOW (ref 4.22–5.81)
RDW: 17.3 % — ABNORMAL HIGH (ref 11.5–15.5)
WBC: 1.3 10*3/uL — CL (ref 4.0–10.5)

## 2014-04-30 NOTE — Telephone Encounter (Signed)
lvm for pt regarding to May 20th appt...Marland KitchenMarland Kitchen

## 2014-04-30 NOTE — Progress Notes (Signed)
Progress Note   Jeffrey Snow LFY:[id number] DOB: 1956-10-09 DOA: 04/21/2014 PCP: [name],[name], MD   Brief Narrative:   Jeffrey Snow is an 58 y.o. male with a PMH of peripheral neuropathy, hypertension, peripheral arterial disease/arterial insufficiency with chronic intermittent claudication status post arteriogram which showed a right distal SFA occlusion with essentially hypertrophied collaterals from the R profunda femoral artery which constitute a bypass of the R distal SFA occlusion, L popliteal artery is occluded at the knee but numerous geniculate arteries bypass the short segment occlusion, who is under the care of Dr. Bridgett Larsson of vascular surgery who recommended conservative care of his arterial disease with maximal medical management including strict control of blood pressure, blood glucose and lipids, regular exercise, smoking cessation. Dr. Bridgett Larsson did not think the patient's anesthesia/neuropathy symptoms were from his peripheral arterial disease because of his hypertrophic collaterals.  He was admitted 04/21/14 with right leg/foot pain, weakness, and falls. He also has a worsening rash with petechiae.  Plan was initially to obtain biopsy of his liver lesion while in the hospital, but IR felt he was high-risk for this given his thrombocytopenia, and recommended oncology consultation. The patient was seen by Dr. Jana Hakim feels that his presentation is likely consistent with hepatocellular carcinoma.  Assessment/Plan:   Principal Problem:  Cellulitis RLE / Pain in limb / rash and nonspecific skin eruption in a patient with a history of peripheral artery disease/arterial insufficiency/atherosclerosis of native arteries of the extremities with intermittent claudication   Dr. Rockne Menghini discussed with Dr. Bridgett Larsson of vascular surgery 04/21/14 who reviewed the images above. According to Dr. Vallarie Mare, he had a rash similar to the one on his left leg at the time of the arteriogram. His right leg is  worse. He does not feel this is related to the arteriogram and confirms that the patient has good collateral blood flow.   Continue aspirin. Lipids OK.   ESR WNL making vasculitis unlikely. DIC panel negative for schistocytes. HIV nonreactive. Hepatitis C viral load with a log of 5.49.  Dopplers negative for DVT(elevated d-dimer).  The patient has bilateral LE purpura, with superimposed cellulitis on the right, which was initially managed with vancomycin. Improved- transitioned to PO Doxycycline on 5/13.  Continue Vicodin as needed for pain control. Avoid IV narcotics.  As per oncology, patient's skin rash was felt to be due to secondary to cryoglobulinemia. However negative cryoglobulin test and hence rash probably not due to cryoglobulinemia. Active Problems:  Pancytopenia  Patient has had a significant deterioration in his platelet count which was 115 on 10/12/12.  His WBC and hemoglobin are also low, possibly from bone marrow suppression/liver disease.  As per oncology, pancytopenia most likely secondary to marrow suppression from alcohol abuse, but consider MDS. Thrombocytopenia could also be secondary to splenomegaly. Please refer to detailed oncology note for 5/14 regarding management. Trial of platelet transfusion on 5/14  Transfuse PRBCs if hemoglobin is less than 7 g per DL. Leukopenia/neutropenia but no fevers-will not start Abx.  Discontinued the aspirin due to severe thrombocytopenia and high risk for bleeding.  Patient received a unit of platelet transfusion on 5/14 and as per oncology, platelet count improved posttransfusion and hence not suggestive of ITP but more consistent with bone marrow suppression by EtOH. Continue to follow CBCs- all counts low but stable. No bleeding. Depression   Continue Xanax and Prozac.  Psychiatry reevaluated on 5/15 and have determined that he has capacity to make medical decisions. Fall   Physical  therapy evaluation performed 04/22/14:  SNF recommended, however patient has refused even though he cannot walk. PT reevaluated on 5/12 with same recommendations. HTN (hypertension)   Continue Lotensin and hydrochlorothiazide. Normocytic anemia   Anemia panel shows a ferritin of 671, folate 9.7, B12 877, iron 65, TIBC 165, 39% saturation.  Anemia likely from bone marrow suppression from ETOH and chronic disease. Cirrhosis, alcoholic / coagulopathy   Changes of mild cirrhosis noted on MRI of the abdomen done 01/06/13. Known history of heavy alcohol use in the past.   History of polysubstance abuse in the past. Urine drug screen positive for benzos (on Xanax), HIV nonreactive.   Coagulopathy likely related to cirrhosis. Hyponatremia   Due to dehydration and HCTZ.Stable. DC IVF. Hypokalemia   Resolved with potassium added to IV fluids. Metabolic acidosis   Mild elevation of serum lactate noted. Resolved with hydration. Hepatitis C / elevated AFP tumor marker / Abnormal MRI of abdomen, liver/clinical diagnosis of hepatoma  Right liver lesion, increasing in size since January, in the setting of cirrhosis and hepatitis C positivity, elevated AFP of 75 supporting clinical diagnosis of hepatoma.  Please referred to detailed oncology note for 5/14.  Surgical ablation not advisable by surgery but transcatheter embolization may be feasible by IR. This can be done only after platelet counts improved. Management of thrombocytopenia as stated above. Interventional radiology consultation appreciated and plan intervention as OP T wave inversion in EKG / Elevated troponins   No complaints of chest pain. Troponins elevated, but declining. Continue aspirin.  2-D echocardiogram normal.  No hyperlipidemia.  Re-check 12 lead EKG 04/22/14 shows persistent T wave inversions in V1/V2.  Troponin now normal.  May consider OP ischemic eval i.e stress test, unless he undergoes IP surgery etc- then will call for preop evaluation. DVT prophylaxis    Heparin/Lovenox contraindicated given thrombocytopenia, and with claudication, would not use SCDs.  Code Status: Full. Family Communication: discussed with brother [name] on 5/14, updated care and answered questions Disposition Plan: Home vs. SNF when his counts have been stable for next 48 hours..    IV Access:    Peripheral IV   Procedures:    2 D Echo 04/22/14: EF 60-65%, no regional wall motion abnormalities.  Left ventricular diastolic function parameters were normal.   Medical Consultants:    Interventional radiology  Dr. Lurline Del, Oncology.  General Surgery  Psychiatry   Other Consultants:    PT   Anti-Infectives:    Vancomycin 5/10>  PO Doxycycline 5/13>5/15  Subjective:   Jeffrey Snow denied complaints this morning. As per nursing, no acute issues.  Objective:    Filed Vitals:   04/29/14 0646 04/29/14 1340 04/29/14 2200 04/30/14 0600  BP: 123/71 135/76 133/73 146/69  Pulse: 70 71 80 70  Temp: 98.3 F (36.8 C) 98.2 F (36.8 C) 97.8 F (36.6 C) 97.8 F (36.6 C)  TempSrc: Oral Oral Oral Oral  Resp: '18 18 18 18  ' Height:      Weight:      SpO2: 97% 96% 96% 97%    Intake/Output Summary (Last 24 hours) at 04/30/14 0932 Last data filed at 04/30/14 9485  Gross per 24 hour  Intake      0 ml  Output    600 ml  Net   -600 ml    Exam: Gen:  NAD Cardiovascular:  RRR, No M/R/G Respiratory:  Lungs CTAB Gastrointestinal:  Abdomen soft, NT/ND, + BS Extremities:  RLE erythema resolved, palpable purpura BLE  unchanged.  See images uploaded to note in H&P and progress note 04/24/14.  CNS: Alert and oriented x3. No focal deficits.  Data Reviewed:    Labs: Basic Metabolic Panel:  Recent Labs Lab 04/25/14 0453 04/26/14 0425 04/28/14 0445 04/28/14 1430 04/29/14 0700  NA 131* 132* 133* 131* 131*  K 4.6 5.0 4.7 4.8 4.5  CL 102 101 101 99 100  CO2 '20 19 20 20 22  ' GLUCOSE 82 82 63* 69* 86  BUN '14 13 13 13 14    ' CREATININE 1.00 0.89 0.90 0.92 0.95  CALCIUM 8.0* 8.0* 8.1* 8.3* 8.3*   GFR Estimated Creatinine Clearance: 85.6 ml/min (by C-G formula based on Cr of 0.95). Liver Function Tests:  Recent Labs Lab 04/24/14 0757 04/25/14 0453 04/26/14 0425 04/28/14 1430 04/29/14 0700  AST 50* 54* 59* 64* 63*  ALT '30 29 30 30 30  ' ALKPHOS 65 66 59 58 66  BILITOT 2.4* 2.3* 2.5* 2.4* 2.2*  PROT 5.2* 4.8* 5.1* 5.4* 4.8*  ALBUMIN 2.2* 2.0* 2.1* 2.3* 2.1*   Coagulation profile  Recent Labs Lab 04/28/14 1356 04/28/14 2214  INR 1.85* 1.96*    CBC:  Recent Labs Lab 04/26/14 0425 04/28/14 0445 04/28/14 1356 04/28/14 1430 04/28/14 2214 04/29/14 0700 04/30/14 0519  WBC 2.2* 1.4*  --  1.5*  --  1.3* 1.3*  NEUTROABS 1.0* 0.5*  --  0.6*  --  0.4* 0.4*  HGB 8.7* 7.6*  --  8.5*  --  7.8* 7.4*  HCT 25.1* 22.1*  --  24.2*  --  21.6* 21.4*  MCV 96.2 96.9  --  95.3  --  94.7 97.3  PLT 40* 23* 36* 33* 40* 47* 44*   Cardiac Enzymes: No results found for this basename: CKTOTAL, CKMB, CKMBINDEX, TROPONINI,  in the last 168 hours Sepsis Labs:  Recent Labs Lab 04/28/14 0445 04/28/14 1430 04/29/14 0700 04/30/14 0519  WBC 1.4* 1.5* 1.3* 1.3*   Microbiology No results found for this or any previous visit (from the past 240 hour(s)).   Radiographs/Studies:   Dg Tibia/fibula Right  04/21/2014   CLINICAL DATA:  Fall.  EXAM: RIGHT TIBIA AND FIBULA - 2 VIEW  COMPARISON:  None.  FINDINGS: No acute bony or joint abnormality identified. No fracture or dislocation. Peripheral vascular calcification present.  IMPRESSION: No acute bony or joint abnormality.   Electronically Signed   By: Marcello Moores  Register   On: 04/21/2014 13:08   Dg Foot Complete Right  03/31/2014   CLINICAL DATA:  right foot pain  EXAM: RIGHT FOOT COMPLETE - 3+ VIEW  COMPARISON:  None.  FINDINGS: There is no evidence of fracture or dislocation. There is no evidence of arthropathy or other focal bone abnormality. Soft tissues are  unremarkable.  IMPRESSION: Negative.   Electronically Signed   By: Margaree Mackintosh M.D.   On: 03/31/2014 14:22   No results found. Medications:   . ALPRAZolam  0.5 mg Oral TID  . benazepril  40 mg Oral Daily  . feeding supplement (ENSURE COMPLETE)  237 mL Oral Q24H  . FLUoxetine  20 mg Oral Daily  . folic acid  1 mg Oral Daily  . hydrocerin   Topical BID  . hydrochlorothiazide  25 mg Oral Daily  . thiamine  100 mg Oral Daily  . zolpidem  10 mg Oral QHS   Continuous Infusions:    Time spent: 20 minutes.    LOS: 9 days   Modena Jansky, MD, FACP, [facility]. [facility] Pager  [phone]  If 7PM-7AM, please contact night-coverage [email or web address] Password [email or web address] 04/30/2014, 9:32 AM  04/30/2014, 9:32 AM    **Disclaimer: This note was dictated with voice recognition software. Similar sounding words can inadvertently be transcribed and this note may contain transcription errors which may not have been corrected upon publication of note.**

## 2014-05-01 LAB — CBC WITH DIFFERENTIAL/PLATELET
Basophils Absolute: 0 10*3/uL (ref 0.0–0.1)
Basophils Relative: 1 % (ref 0–1)
Eosinophils Absolute: 0.1 10*3/uL (ref 0.0–0.7)
Eosinophils Relative: 3 % (ref 0–5)
HCT: 21.6 % — ABNORMAL LOW (ref 39.0–52.0)
Hemoglobin: 7.6 g/dL — ABNORMAL LOW (ref 13.0–17.0)
Lymphocytes Relative: 47 % — ABNORMAL HIGH (ref 12–46)
Lymphs Abs: 0.8 10*3/uL (ref 0.7–4.0)
MCH: 33.6 pg (ref 26.0–34.0)
MCHC: 35.2 g/dL (ref 30.0–36.0)
MCV: 95.6 fL (ref 78.0–100.0)
Monocytes Absolute: 0.3 10*3/uL (ref 0.1–1.0)
Monocytes Relative: 16 % — ABNORMAL HIGH (ref 3–12)
Neutro Abs: 0.5 10*3/uL — ABNORMAL LOW (ref 1.7–7.7)
Neutrophils Relative %: 34 % — ABNORMAL LOW (ref 43–77)
Platelets: 42 10*3/uL — ABNORMAL LOW (ref 150–400)
RBC: 2.26 MIL/uL — ABNORMAL LOW (ref 4.22–5.81)
RDW: 17.9 % — ABNORMAL HIGH (ref 11.5–15.5)
WBC: 1.6 10*3/uL — ABNORMAL LOW (ref 4.0–10.5)

## 2014-05-01 NOTE — Progress Notes (Signed)
Progress Note   Jeffrey Snow BUL:[id number] DOB: [birth year] DOA: 04/21/2014 PCP: [name],[name], MD   Brief Narrative:   Jeffrey Snow is an 58 y.o. male with a PMH of peripheral neuropathy, hypertension, peripheral arterial disease/arterial insufficiency with chronic intermittent claudication status post arteriogram which showed a right distal SFA occlusion with essentially hypertrophied collaterals from the R profunda femoral artery which constitute a bypass of the R distal SFA occlusion, L popliteal artery is occluded at the knee but numerous geniculate arteries bypass the short segment occlusion, who is under the care of Dr. Bridgett Larsson of vascular surgery who recommended conservative care of his arterial disease with maximal medical management including strict control of blood pressure, blood glucose and lipids, regular exercise, smoking cessation. Dr. Bridgett Larsson did not think the patient's anesthesia/neuropathy symptoms were from his peripheral arterial disease because of his hypertrophic collaterals.  He was admitted 04/21/14 with right leg/foot pain, weakness, and falls. He also has a worsening rash with petechiae.  Plan was initially to obtain biopsy of his liver lesion while in the hospital, but IR felt he was high-risk for this given his thrombocytopenia, and recommended oncology consultation. The patient was seen by Dr. Jana Hakim feels that his presentation is likely consistent with hepatocellular carcinoma.  Assessment/Plan:   Principal Problem:  Cellulitis RLE / Pain in limb / rash and nonspecific skin eruption in a patient with a history of peripheral artery disease/arterial insufficiency/atherosclerosis of native arteries of the extremities with intermittent claudication   Dr. Rockne Menghini discussed with Dr. Bridgett Larsson of vascular surgery 04/21/14 who reviewed the images above. According to Dr. Vallarie Mare, he had a rash similar to the one on his left leg at the time of the arteriogram. His right leg is  worse. He does not feel this is related to the arteriogram and confirms that the patient has good collateral blood flow.   Continue aspirin. Lipids OK.   ESR WNL making vasculitis unlikely. DIC panel negative for schistocytes. HIV nonreactive. Hepatitis C viral load with a log of 5.49.  Dopplers negative for DVT(elevated d-dimer).  The patient has bilateral LE purpura, with superimposed cellulitis on the right, which was initially managed with vancomycin. Improved- transitioned to PO Doxycycline on 5/13.  Continue Vicodin as needed for pain control. Avoid IV narcotics.  As per oncology, patient's skin rash was felt to be due to secondary to cryoglobulinemia. However negative cryoglobulin test and hence rash probably not due to cryoglobulinemia. Active Problems:  Pancytopenia  Patient has had a significant deterioration in his platelet count which was 115 on 10/12/12.  His WBC and hemoglobin are also low, possibly from bone marrow suppression/liver disease.  As per oncology, pancytopenia most likely secondary to marrow suppression from alcohol abuse, but consider MDS. Thrombocytopenia could also be secondary to splenomegaly. Please refer to detailed oncology note for 5/14 regarding management. Trial of platelet transfusion on 5/14  Transfuse PRBCs if hemoglobin is less than 7 g per DL. Leukopenia/neutropenia but no fevers-will not start Abx.  Discontinued the aspirin due to severe thrombocytopenia and high risk for bleeding.  Patient received a unit of platelet transfusion on 5/14 and as per oncology, platelet count improved posttransfusion and hence not suggestive of ITP but more consistent with bone marrow suppression by EtOH. Continue to follow CBCs- all counts stable and seem to be improving. No bleeding. Depression   Continue Xanax and Prozac.  Psychiatry reevaluated on 5/15 and have determined that he has capacity to make medical decisions. Fall  Physical therapy evaluation  performed 04/22/14: SNF recommended, however patient has refused even though he cannot walk. PT reevaluated on 5/12 with same recommendations. HTN (hypertension)   Continue Lotensin and hydrochlorothiazide. Normocytic anemia   Anemia panel shows a ferritin of 671, folate 9.7, B12 877, iron 65, TIBC 165, 39% saturation.  Anemia likely from bone marrow suppression from ETOH and chronic disease. Cirrhosis, alcoholic / coagulopathy   Changes of mild cirrhosis noted on MRI of the abdomen done 01/06/13. Known history of heavy alcohol use in the past.   History of polysubstance abuse in the past. Urine drug screen positive for benzos (on Xanax), HIV nonreactive.   Coagulopathy likely related to cirrhosis. Hyponatremia   Due to dehydration and HCTZ.Stable. DC IVF. Hypokalemia   Resolved with potassium added to IV fluids. Metabolic acidosis   Mild elevation of serum lactate noted. Resolved with hydration. Hepatitis C / elevated AFP tumor marker / Abnormal MRI of abdomen, liver/clinical diagnosis of hepatoma  Right liver lesion, increasing in size since January, in the setting of cirrhosis and hepatitis C positivity, elevated AFP of 75 supporting clinical diagnosis of hepatoma.  Please referred to detailed oncology note for 5/14.  Surgical ablation not advisable by surgery but transcatheter embolization may be feasible by IR. This can be done only after platelet counts improved. Management of thrombocytopenia as stated above. Interventional radiology consultation appreciated and plan intervention as OP T wave inversion in EKG / Elevated troponins   No complaints of chest pain. Troponins elevated, but declining. Continue aspirin.  2-D echocardiogram normal.  No hyperlipidemia.  Re-check 12 lead EKG 04/22/14 shows persistent T wave inversions in V1/V2.  Troponin now normal.  May consider OP ischemic eval i.e stress test, unless he undergoes IP surgery etc- then will call for preop  evaluation. DVT prophylaxis   Heparin/Lovenox contraindicated given thrombocytopenia, and with claudication, would not use SCDs.  Code Status: Full. Family Communication: discussed with brother Mr. Solmon Bohr on 5/14, updated care and answered questions Disposition Plan: Patient continues to decline SNF. As per nursing, ambulated a few steps with nursing on 5/16. If CBC continues to improve and stable, DC home in AM.   IV Access:    Peripheral IV   Procedures:    2 D Echo 04/22/14: EF 60-65%, no regional wall motion abnormalities.  Left ventricular diastolic function parameters were normal.   Medical Consultants:    Interventional radiology  Dr. Lurline Del, Oncology.  General Surgery  Psychiatry   Other Consultants:    PT   Anti-Infectives:    Vancomycin 5/10>  PO Doxycycline 5/13>5/15  Subjective:   Jeffrey Snow denied complaints this morning. As per nursing, no acute issues.  Objective:    Filed Vitals:   04/30/14 0600 04/30/14 1400 04/30/14 2122 05/01/14 0620  BP: 146/69 95/54 114/70 126/53  Pulse: 70 72 68 69  Temp: 97.8 F (36.6 C) 98.1 F (36.7 C) 97.7 F (36.5 C) 98.2 F (36.8 C)  TempSrc: Oral Oral Oral Oral  Resp: '18 18 18 18  ' Height:      Weight:      SpO2: 97% 98% 97% 98%    Intake/Output Summary (Last 24 hours) at 05/01/14 1113 Last data filed at 05/01/14 1049  Gross per 24 hour  Intake    720 ml  Output    600 ml  Net    120 ml    Exam: Gen:  NAD Cardiovascular:  RRR, No M/R/G Respiratory:  Lungs CTAB Gastrointestinal:  Abdomen soft, NT/ND, + BS Extremities:  RLE erythema resolved, palpable purpura BLE unchanged.  See images uploaded to note in H&P and progress note 04/24/14.  CNS: Alert and oriented x3. No focal deficits.  Data Reviewed:    Labs: Basic Metabolic Panel:  Recent Labs Lab 04/25/14 0453 04/26/14 0425 04/28/14 0445 04/28/14 1430 04/29/14 0700  NA 131* 132* 133* 131* 131*  K 4.6  5.0 4.7 4.8 4.5  CL 102 101 101 99 100  CO2 '20 19 20 20 22  ' GLUCOSE 82 82 63* 69* 86  BUN '14 13 13 13 14  ' CREATININE 1.00 0.89 0.90 0.92 0.95  CALCIUM 8.0* 8.0* 8.1* 8.3* 8.3*   GFR Estimated Creatinine Clearance: 85.6 ml/min (by C-G formula based on Cr of 0.95). Liver Function Tests:  Recent Labs Lab 04/25/14 0453 04/26/14 0425 04/28/14 1430 04/29/14 0700  AST 54* 59* 64* 63*  ALT '29 30 30 30  ' ALKPHOS 66 59 58 66  BILITOT 2.3* 2.5* 2.4* 2.2*  PROT 4.8* 5.1* 5.4* 4.8*  ALBUMIN 2.0* 2.1* 2.3* 2.1*   Coagulation profile  Recent Labs Lab 04/28/14 1356 04/28/14 2214  INR 1.85* 1.96*    CBC:  Recent Labs Lab 04/28/14 0445  04/28/14 1430 04/28/14 2214 04/29/14 0700 04/30/14 0519 05/01/14 0527  WBC 1.4*  --  1.5*  --  1.3* 1.3* 1.6*  NEUTROABS 0.5*  --  0.6*  --  0.4* 0.4* 0.5*  HGB 7.6*  --  8.5*  --  7.8* 7.4* 7.6*  HCT 22.1*  --  24.2*  --  21.6* 21.4* 21.6*  MCV 96.9  --  95.3  --  94.7 97.3 95.6  PLT 23*  < > 33* 40* 47* 44* 42*  < > = values in this interval not displayed. Cardiac Enzymes: No results found for this basename: CKTOTAL, CKMB, CKMBINDEX, TROPONINI,  in the last 168 hours Sepsis Labs:  Recent Labs Lab 04/28/14 1430 04/29/14 0700 04/30/14 0519 05/01/14 0527  WBC 1.5* 1.3* 1.3* 1.6*   Microbiology No results found for this or any previous visit (from the past 240 hour(s)).   Radiographs/Studies:   Dg Tibia/fibula Right  04/21/2014   CLINICAL DATA:  Fall.  EXAM: RIGHT TIBIA AND FIBULA - 2 VIEW  COMPARISON:  None.  FINDINGS: No acute bony or joint abnormality identified. No fracture or dislocation. Peripheral vascular calcification present.  IMPRESSION: No acute bony or joint abnormality.   Electronically Signed   By: Marcello Moores  Register   On: 04/21/2014 13:08   Dg Foot Complete Right  03/31/2014   CLINICAL DATA:  right foot pain  EXAM: RIGHT FOOT COMPLETE - 3+ VIEW  COMPARISON:  None.  FINDINGS: There is no evidence of fracture or  dislocation. There is no evidence of arthropathy or other focal bone abnormality. Soft tissues are unremarkable.  IMPRESSION: Negative.   Electronically Signed   By: Margaree Mackintosh M.D.   On: 03/31/2014 14:22   No results found. Medications:   . ALPRAZolam  0.5 mg Oral TID  . benazepril  40 mg Oral Daily  . feeding supplement (ENSURE COMPLETE)  237 mL Oral Q24H  . FLUoxetine  20 mg Oral Daily  . folic acid  1 mg Oral Daily  . hydrocerin   Topical BID  . hydrochlorothiazide  25 mg Oral Daily  . thiamine  100 mg Oral Daily  . zolpidem  10 mg Oral QHS   Continuous Infusions:    Time spent: 20 minutes.    LOS:  10 days   Modena Jansky, MD, FACP, [facility]. [facility] Pager [phone]  If 7PM-7AM, please contact night-coverage [email or web address] Password [email or web address] 05/01/2014, 11:13 AM  05/01/2014, 11:13 AM    **Disclaimer: This note was dictated with voice recognition software. Similar sounding words can inadvertently be transcribed and this note may contain transcription errors which may not have been corrected upon publication of note.**

## 2014-05-02 ENCOUNTER — Encounter: Payer: Self-pay | Admitting: *Deleted

## 2014-05-02 ENCOUNTER — Telehealth: Payer: Self-pay | Admitting: *Deleted

## 2014-05-02 ENCOUNTER — Telehealth: Payer: Self-pay | Admitting: Oncology

## 2014-05-02 ENCOUNTER — Other Ambulatory Visit: Payer: Self-pay | Admitting: Oncology

## 2014-05-02 DIAGNOSIS — D696 Thrombocytopenia, unspecified: Secondary | ICD-10-CM

## 2014-05-02 DIAGNOSIS — I70219 Atherosclerosis of native arteries of extremities with intermittent claudication, unspecified extremity: Secondary | ICD-10-CM

## 2014-05-02 LAB — CBC WITH DIFFERENTIAL/PLATELET
Basophils Absolute: 0 10*3/uL (ref 0.0–0.1)
Basophils Relative: 1 % (ref 0–1)
Eosinophils Absolute: 0 10*3/uL (ref 0.0–0.7)
Eosinophils Relative: 2 % (ref 0–5)
HCT: 22.6 % — ABNORMAL LOW (ref 39.0–52.0)
Hemoglobin: 7.9 g/dL — ABNORMAL LOW (ref 13.0–17.0)
Lymphocytes Relative: 44 % (ref 12–46)
Lymphs Abs: 0.7 10*3/uL (ref 0.7–4.0)
MCH: 33.9 pg (ref 26.0–34.0)
MCHC: 35 g/dL (ref 30.0–36.0)
MCV: 97 fL (ref 78.0–100.0)
Monocytes Absolute: 0.2 10*3/uL (ref 0.1–1.0)
Monocytes Relative: 14 % — ABNORMAL HIGH (ref 3–12)
Neutro Abs: 0.6 10*3/uL — ABNORMAL LOW (ref 1.7–7.7)
Neutrophils Relative %: 39 % — ABNORMAL LOW (ref 43–77)
Platelets: 41 10*3/uL — ABNORMAL LOW (ref 150–400)
RBC: 2.33 MIL/uL — ABNORMAL LOW (ref 4.22–5.81)
RDW: 18.2 % — ABNORMAL HIGH (ref 11.5–15.5)
WBC: 1.5 10*3/uL — ABNORMAL LOW (ref 4.0–10.5)

## 2014-05-02 LAB — HEPARIN INDUCED THROMBOCYTOPENIA PNL
Heparin Induced Plt Ab: NEGATIVE
Patient O.D.: 0.076
UFH High Dose UFH H: 5 % Release
UFH Low Dose 0.1 IU/mL: 5 % Release
UFH Low Dose 0.5 IU/mL: 1 % Release
UFH SRA Result: NEGATIVE

## 2014-05-02 MED ORDER — HYDROCODONE-ACETAMINOPHEN 5-325 MG PO TABS: 1.0000 | ORAL_TABLET | Freq: Three times a day (TID) | ORAL | Status: DC | PRN

## 2014-05-02 MED ORDER — THIAMINE HCL 100 MG PO TABS: 100.0000 mg | ORAL_TABLET | Freq: Every day | ORAL | Status: DC

## 2014-05-02 MED ORDER — FOLIC ACID 1 MG PO TABS: 1.0000 mg | ORAL_TABLET | Freq: Every day | ORAL | Status: DC

## 2014-05-02 MED ORDER — ENSURE COMPLETE PO LIQD: 237.0000 mL | ORAL | Status: DC

## 2014-05-02 NOTE — Progress Notes (Addendum)
Progress Note   Jeffrey Snow WNI:[id number] DOB: Jun 12, 1956 DOA: 04/21/2014 PCP: [name],[name], MD   Brief Narrative:   Jeffrey Snow is an 58 y.o. male with a PMH of peripheral neuropathy, hypertension, peripheral arterial disease/arterial insufficiency with chronic intermittent claudication status post arteriogram which showed a right distal SFA occlusion with essentially hypertrophied collaterals from the R profunda femoral artery which constitute a bypass of the R distal SFA occlusion, L popliteal artery is occluded at the knee but numerous geniculate arteries bypass the short segment occlusion, who is under the care of Dr. Bridgett Larsson of vascular surgery who recommended conservative care of his arterial disease with maximal medical management including strict control of blood pressure, blood glucose and lipids, regular exercise, smoking cessation. Dr. Bridgett Larsson did not think the patient's anesthesia/neuropathy symptoms were from his peripheral arterial disease because of his hypertrophic collaterals.  He was admitted 04/21/14 with right leg/foot pain, weakness, and falls. He also has a worsening rash with petechiae.  Plan was initially to obtain biopsy of his liver lesion while in the hospital, but IR felt he was high-risk for this given his thrombocytopenia, and recommended oncology consultation. The patient was seen by Dr. Jana Hakim feels that his presentation is likely consistent with hepatocellular carcinoma.  Assessment/Plan:   Principal Problem:  Cellulitis RLE / Pain in limb / rash and nonspecific skin eruption in a patient with a history of peripheral artery disease/arterial insufficiency/atherosclerosis of native arteries of the extremities with intermittent claudication   Dr. Rockne Menghini discussed with Dr. Bridgett Larsson of vascular surgery 04/21/14 who reviewed the images above. According to Dr. Vallarie Mare, he had a rash similar to the one on his left leg at the time of the arteriogram. His right leg is  worse. He does not feel this is related to the arteriogram and confirms that the patient has good collateral blood flow.   Continue aspirin. Lipids OK.   ESR WNL making vasculitis unlikely. DIC panel negative for schistocytes. HIV nonreactive. Hepatitis C viral load with a log of 5.49.  Dopplers negative for DVT(elevated d-dimer).  The patient has bilateral LE purpura, with superimposed cellulitis on the right, which was initially managed with vancomycin. Improved- transitioned to PO Doxycycline on 5/13.  Continue Vicodin as needed for pain control. Avoid IV narcotics.  As per oncology, patient's skin rash was felt to be due to secondary to cryoglobulinemia. However negative cryoglobulin test and hence rash probably not due to cryoglobulinemia. Active Problems:  Pancytopenia  Patient has had a significant deterioration in his platelet count which was 115 on 10/12/12.  His WBC and hemoglobin are also low, possibly from bone marrow suppression/liver disease.  As per oncology, pancytopenia most likely secondary to marrow suppression from alcohol abuse, but consider MDS. Thrombocytopenia could also be secondary to splenomegaly. Please refer to detailed oncology note for 5/14 regarding management. Trial of platelet transfusion on 5/14  Transfuse PRBCs if hemoglobin is less than 7 g per DL. Leukopenia/neutropenia but no fevers-will not start Abx.  Discontinued the aspirin due to severe thrombocytopenia and high risk for bleeding.  Patient received a unit of platelet transfusion on 5/14 and as per oncology, platelet count improved posttransfusion and hence not suggestive of ITP but more consistent with bone marrow suppression by EtOH. Continue to follow CBCs- all counts stable and seem to be improving. No bleeding. These can be closely followed OP. Depression   Continue Xanax and Prozac.  Psychiatry reevaluated on 5/15 and have determined that he has  capacity to make medical decisions. Fall     Physical therapy evaluation performed 04/22/14: SNF recommended, however patient has refused even though he cannot walk. PT reevaluated on 5/12 with same recommendations.  Patient states that on the way to the bathroom this morning, he sustained a fall but did not witness head nor sustain injuries or have LOC. HTN (hypertension)   Continue Lotensin and hydrochlorothiazide. Normocytic anemia   Anemia panel shows a ferritin of 671, folate 9.7, B12 877, iron 65, TIBC 165, 39% saturation.  Anemia likely from bone marrow suppression from ETOH and chronic disease. Stable. Cirrhosis, alcoholic / coagulopathy   Changes of mild cirrhosis noted on MRI of the abdomen done 01/06/13. Known history of heavy alcohol use in the past.   History of polysubstance abuse in the past. Urine drug screen positive for benzos (on Xanax), HIV nonreactive.   Coagulopathy likely related to cirrhosis. Hyponatremia   Due to dehydration and HCTZ.Stable. DC IVF. Hypokalemia   Resolved with potassium added to IV fluids. Metabolic acidosis   Mild elevation of serum lactate noted. Resolved with hydration. Hepatitis C / elevated AFP tumor marker / Abnormal MRI of abdomen, liver/clinical diagnosis of hepatoma  Right liver lesion, increasing in size since January, in the setting of cirrhosis and hepatitis C positivity, elevated AFP of 75 supporting clinical diagnosis of hepatoma.  Please referred to detailed oncology note for 5/14.  Surgical ablation not advisable by surgery but transcatheter embolization may be feasible by IR. This can be done only after platelet counts improved. Management of thrombocytopenia as stated above. Interventional radiology consultation appreciated and plan intervention as OP  Discussed with interventional radiology, Jeffrey Snow on 5/18: They plan to perform transcatheter embolization/chemoembolization on Thursday or Friday of this week. They request preprocedure cardiac  clearance-called. They are also requesting that patient remain in the hospital for the procedure due to concern that he may go home and start drinking alcohol leading to cancellation of procedure-however advised that from a medical standpoint, patient is stable for discharge, he has been offered SNF which she has repeatedly refused & he has been counseled repeatedly regarding abstinence from alcohol and despite this if he starts drinking, he will be making extremely poor judgment as far as his health is concerned. He does have capacity for medical decision making.  Discussed with Dr. Jana HakimBurnis Medin arrange for preprocedure followup and platelet transfusion. T wave inversion in EKG / Elevated troponins   No complaints of chest pain. Troponins elevated, but declining. Continue aspirin.  2-D echocardiogram normal.  No hyperlipidemia.  Re-check 12 lead EKG 04/22/14 shows persistent T wave inversions in V1/V2.  Troponin now normal.  Requested preprocedure cardiac clearance on 5/18 DVT prophylaxis   Heparin/Lovenox contraindicated given thrombocytopenia, and with claudication, would not use SCDs.  Code Status: Full. Family Communication: discussed with brother Jeffrey Snow on 5/18, updated care and answered questions Disposition Plan: Patient continues to decline SNF. DC home pending cardiac evaluation and outpatient followup with IR and medical oncology. Patient is at high risk for decline and readmission to the hospital. Discussed with case management and patient can't followup with [facility] due to his insurance status (primary medicaid).  IV Access:    Peripheral IV   Procedures:    2 D Echo 04/22/14: EF 60-65%, no regional wall motion abnormalities.  Left ventricular diastolic function parameters were normal.   Medical Consultants:    Interventional radiology  Dr. Lurline Del, Oncology.  General Surgery  Psychiatry  Other Consultants:    PT   Anti-Infectives:     Vancomycin 5/10>  PO Doxycycline 5/13>5/15  Subjective:   Jeffrey Snow states that he fell this morning on the way to the toilet but did not hit his head or sustain injuries. Denies LOC. Denies complaints at this time.  Objective:    Filed Vitals:   05/01/14 0620 05/01/14 1500 05/01/14 2127 05/02/14 0533  BP: 126/53 111/71 105/68 113/66  Pulse: 69 66 66 73  Temp: 98.2 F (36.8 C) 97.5 F (36.4 C) 98.7 F (37.1 C) 97.9 F (36.6 C)  TempSrc: Oral Oral Oral Oral  Resp: _0 Height:      Weight:      SpO2: 98% 97% 97% 97%    Intake/Output Summary (Last 24 hours) at 05/02/14 1034 Last data filed at 05/02/14 0300  Gross per 24 hour  Intake    360 ml  Output   1100 ml  Net   -740 ml    Exam: Gen:  NAD Cardiovascular:  RRR, No M/R/G Respiratory:  Lungs CTAB Gastrointestinal:  Abdomen soft, NT/ND, + BS Extremities:  RLE erythema resolved, palpable purpura BLE unchanged.  See images uploaded to note in H&P and progress note 04/24/14.  CNS: Alert and oriented x3. No focal deficits.  Data Reviewed:    Labs: Basic Metabolic Panel:  Recent Labs Lab 04/26/14 0425 04/28/14 0445 04/28/14 1430 04/29/14 0700  NA 132* 133* 131* 131*  K 5.0 4.7 4.8 4.5  CL 101 101 99 100  CO2 _1 GLUCOSE 82 63* 69* 86  BUN _2 CREATININE 0.89 0.90 0.92 0.95  CALCIUM 8.0* 8.1* 8.3* 8.3*   GFR Estimated Creatinine Clearance: 85.6 ml/min (by C-G formula based on Cr of 0.95). Liver Function Tests:  Recent Labs Lab 04/26/14 0425 04/28/14 1430 04/29/14 0700  AST 59* 64* 63*  ALT _3 ALKPHOS 59 58 66  BILITOT 2.5* 2.4* 2.2*  PROT 5.1* 5.4* 4.8*  ALBUMIN 2.1* 2.3* 2.1*   Coagulation profile  Recent Labs Lab 04/28/14 1356 04/28/14 2214  INR 1.85* 1.96*    CBC:  Recent Labs Lab 04/28/14 1430 04/28/14 2214 04/29/14 0700 04/30/14 0519 05/01/14 0527 05/02/14 0511  WBC 1.5*  --  1.3* 1.3* 1.6* 1.5*  NEUTROABS 0.6*  --  0.4*  0.4* 0.5* 0.6*  HGB 8.5*  --  7.8* 7.4* 7.6* 7.9*  HCT 24.2*  --  21.6* 21.4* 21.6* 22.6*  MCV 95.3  --  94.7 97.3 95.6 97.0  PLT 33* 40* 47* 44* 42* 41*   Cardiac Enzymes: No results found for this basename: CKTOTAL, CKMB, CKMBINDEX, TROPONINI,  in the last 168 hours Sepsis Labs:  Recent Labs Lab 04/29/14 0700 04/30/14 0519 05/01/14 0527 05/02/14 0511  WBC 1.3* 1.3* 1.6* 1.5*   Microbiology No results found for this or any previous visit (from the past 240 hour(s)).   Radiographs/Studies:   Dg Tibia/fibula Right  04/21/2014   CLINICAL DATA:  Fall.  EXAM: RIGHT TIBIA AND FIBULA - 2 VIEW  COMPARISON:  None.  FINDINGS: No acute bony or joint abnormality identified. No fracture or dislocation. Peripheral vascular calcification present.  IMPRESSION: No acute bony or joint abnormality.   Electronically Signed   By: Marcello Moores  Register   On: 04/21/2014 13:08   Dg Foot Complete Right  03/31/2014   CLINICAL DATA:  right foot pain  EXAM: RIGHT FOOT COMPLETE - 3+ VIEW  COMPARISON:  None.  FINDINGS: There is no evidence of fracture or dislocation. There is no evidence of arthropathy or other focal bone abnormality. Soft tissues are unremarkable.  IMPRESSION: Negative.   Electronically Signed   By: Margaree Mackintosh M.D.   On: 03/31/2014 14:22   No results found. Medications:   . ALPRAZolam  0.5 mg Oral TID  . benazepril  40 mg Oral Daily  . feeding supplement (ENSURE COMPLETE)  237 mL Oral Q24H  . FLUoxetine  20 mg Oral Daily  . folic acid  1 mg Oral Daily  . hydrocerin   Topical BID  . hydrochlorothiazide  25 mg Oral Daily  . thiamine  100 mg Oral Daily  . zolpidem  10 mg Oral QHS   Continuous Infusions:    Time spent: 20 minutes.    LOS: 11 days   Modena Jansky, MD, FACP, [facility]. [facility] Pager [phone]  If 7PM-7AM, please contact night-coverage [email or web address] Password [email or web address] 05/02/2014, 10:34 AM  05/02/2014, 10:34 AM    **Disclaimer: This note was dictated with  voice recognition software. Similar sounding words can inadvertently be transcribed and this note may contain transcription errors which may not have been corrected upon publication of note.**

## 2014-05-02 NOTE — Progress Notes (Signed)
Patient to be discharged from [facility] today. [facility] called to admitting for 05/04/14. Called and spoke with [facility] in blood bank for 2 packs of platelets for 05/04/14 Left voicemail for Sharyn Lull to call me back if unable to do platelets here, then will call [facility].

## 2014-05-02 NOTE — Consult Note (Signed)
Reason for Consult:  PreOp Clearance Referring Physician:   CRAIG IONESCU is an 58 y.o. male.  HPI:   Patient is a 58 year old male with a history of hepatitis C, alcoholic cirrhosis, peripheral vascular disease, hypertension, anxiety, depression.  2-D echo crit echocardiogram on May 8 revealed an ejection fraction of 60-65% with normal wall motion. No regional wall motion abnormalities. Mild MR moderate pulmonary hypertension with pressures of 46 mm mercury. Normal diastolic parameters.  Recent MRI of the abdomen showed a 3.2 cm lesion in the posterior segment of the right hepatic lobe. This is suspicious for hepatocellular carcinoma. It also showed cirrhosis with splenomegaly a small amount of abdominal ascites, cholelithiasis and secondary gallbladder wall thickening.  Recent EKG shows normal sinus rhythm with T wave inversion V1-3 no significant change from prior.  He has chronic intermittent claudication status post arteriogram on 12/27/2013 which showed a right distal SFA occlusion.  The popliteal had an occluded proximal segment with rapid reconstitution via extensive collaterals.  The left SFA had a 75% stenosis in the proximal segment and occluded segment at the knee with reconstitution of distal segment via geniculate collaterals.  Patient initially presented with right leg and foot pain weakness and falls. Patient had a troponin elevation of 0.73 on May 7 the day he was admitted.  His hemoglobin at that time was 9.5  and is currently 7.9 stable.  His been transfused one unit of platelet.  Lower extremity venous Dopplers were negative for DVT.  The patient presented on 04/21/14 after falling twice at home.  He states the first time he was going to car when his legs got very weak.  One of the grounds keepers picked him up.  Once he got back into his home he fell again in the kitchen.  He reports never having chest pain, however, he took some cough medicine that day and remembers his heart  started beating fast immediately after.  He reports some SOB but otherwise denies nausea, vomiting, fever, chest pain, orthopnea, dizziness, PND, congestion, abdominal pain, hematochezia, melena.   Past Medical History  Diagnosis Date  . Depression   . Anxiety   . Panic attack   . Hypertension   . Hepatitis C   . Claudication of left lower extremity m-1  . Abnormal MRI of abdomen, liver  04/21/2014  . Cirrhosis, alcoholic 08/17/1193    Past Surgical History  Procedure Laterality Date  . Cosmetic surgery      facial reconstructive surgery  . Aortogram      Family History  Problem Relation Age of Onset  . Diabetes Father   . Diabetes Brother   . Diabetes Brother     Social History:  reports that he quit smoking about 2 years ago. He has never used smokeless tobacco. He reports that he drinks about 3.6 ounces of alcohol per week. He reports that he does not use illicit drugs.  Allergies: No Known Allergies  Medications: Prior to Admission medications   Medication Sig Start Date End Date Taking? Authorizing Provider  ALPRAZolam Duanne Moron) 0.5 MG tablet Take 0.5 mg by mouth 3 (three) times daily.   Yes Historical Provider, MD  aspirin 81 MG chewable tablet Chew 81 mg by mouth daily.   Yes Historical Provider, MD  benazepril (LOTENSIN) 40 MG tablet Take 1 tablet (40 mg total) by mouth daily. 03/03/14  Yes Leana Gamer, MD  ergocalciferol (DRISDOL) 50000 UNITS capsule Take 1 capsule (50,000 Units total) by mouth once  a week. 03/31/14  Yes Dorena Dew, FNP  FLUoxetine (PROZAC) 20 MG capsule Take 20 mg by mouth daily.   Yes Historical Provider, MD  hydrochlorothiazide (HYDRODIURIL) 25 MG tablet Take 1 tablet (25 mg total) by mouth daily. 03/03/14  Yes Leana Gamer, MD  hydrocortisone (ANUSOL-HC) 2.5 % rectal cream Place 1 application rectally 2 (two) times daily as needed for hemorrhoids or itching.   Yes Historical Provider, MD  permethrin (ELIMITE) 5 % cream Apply cream  from head to toe; leave on for 8-14 hours before washing off with water; may reapply in 1 week if live mites appear. 11/15/13  Yes Leana Gamer, MD  zolpidem (AMBIEN) 10 MG tablet Take 10 mg by mouth at bedtime.    Yes Historical Provider, MD     Results for orders placed during the hospital encounter of 04/21/14 (from the past 48 hour(s))  CBC WITH DIFFERENTIAL     Status: Abnormal   Collection Time    05/01/14  5:27 AM      Result Value Ref Range   WBC 1.6 (*) 4.0 - 10.5 K/uL   RBC 2.26 (*) 4.22 - 5.81 MIL/uL   Hemoglobin 7.6 (*) 13.0 - 17.0 g/dL   HCT 21.6 (*) 39.0 - 52.0 %   MCV 95.6  78.0 - 100.0 fL   MCH 33.6  26.0 - 34.0 pg   MCHC 35.2  30.0 - 36.0 g/dL   RDW 17.9 (*) 11.5 - 15.5 %   Platelets 42 (*) 150 - 400 K/uL   Comment: CONSISTENT WITH PREVIOUS RESULT   Neutrophils Relative % 34 (*) 43 - 77 %   Neutro Abs 0.5 (*) 1.7 - 7.7 K/uL   Lymphocytes Relative 47 (*) 12 - 46 %   Lymphs Abs 0.8  0.7 - 4.0 K/uL   Monocytes Relative 16 (*) 3 - 12 %   Monocytes Absolute 0.3  0.1 - 1.0 K/uL   Eosinophils Relative 3  0 - 5 %   Eosinophils Absolute 0.1  0.0 - 0.7 K/uL   Basophils Relative 1  0 - 1 %   Basophils Absolute 0.0  0.0 - 0.1 K/uL  CBC WITH DIFFERENTIAL     Status: Abnormal   Collection Time    05/02/14  5:11 AM      Result Value Ref Range   WBC 1.5 (*) 4.0 - 10.5 K/uL   RBC 2.33 (*) 4.22 - 5.81 MIL/uL   Hemoglobin 7.9 (*) 13.0 - 17.0 g/dL   HCT 22.6 (*) 39.0 - 52.0 %   MCV 97.0  78.0 - 100.0 fL   MCH 33.9  26.0 - 34.0 pg   MCHC 35.0  30.0 - 36.0 g/dL   RDW 18.2 (*) 11.5 - 15.5 %   Platelets 41 (*) 150 - 400 K/uL   Comment: CONSISTENT WITH PREVIOUS RESULT   Neutrophils Relative % 39 (*) 43 - 77 %   Lymphocytes Relative 44  12 - 46 %   Monocytes Relative 14 (*) 3 - 12 %   Eosinophils Relative 2  0 - 5 %   Basophils Relative 1  0 - 1 %   Neutro Abs 0.6 (*) 1.7 - 7.7 K/uL   Lymphs Abs 0.7  0.7 - 4.0 K/uL   Monocytes Absolute 0.2  0.1 - 1.0 K/uL    Eosinophils Absolute 0.0  0.0 - 0.7 K/uL   Basophils Absolute 0.0  0.0 - 0.1 K/uL   Smear Review MORPHOLOGY UNREMARKABLE  No results found.  Review of Systems  Constitutional: Negative for fever and diaphoresis.  HENT: Negative for congestion.   Respiratory: Positive for shortness of breath. Negative for cough and wheezing.   Cardiovascular: Positive for palpitations (The prior to adm) and leg swelling. Negative for chest pain, orthopnea and PND.  Gastrointestinal: Negative for nausea, vomiting, abdominal pain, blood in stool and melena.  Genitourinary: Negative for hematuria.  Musculoskeletal: Positive for myalgias (right leg).  Neurological: Positive for weakness (in the legs). Negative for dizziness.  All other systems reviewed and are negative.  Blood pressure 113/66, pulse 73, temperature 97.9 F (36.6 C), temperature source Oral, resp. rate 18, height 5\' 7"  (1.702 m), weight 180 lb (81.647 kg), SpO2 97.00%. Physical Exam  Nursing note and vitals reviewed. Constitutional: He is oriented to person, place, and time. He appears well-developed and well-nourished. No distress.  HENT:  Head: Normocephalic and atraumatic.  Eyes: EOM are normal. Pupils are equal, round, and reactive to light. No scleral icterus.  Neck: Normal range of motion. Neck supple. No JVD present.  Cardiovascular: S1 normal and S2 normal.  A regularly irregular rhythm present.  No murmur heard. Pulses:      Radial pulses are 2+ on the right side, and 2+ on the left side.       Dorsalis pedis pulses are 2+ on the right side, and 2+ on the left side.  Respiratory: Effort normal and breath sounds normal. He has no wheezes. He has no rales.  GI: Soft. Bowel sounds are normal. He exhibits no distension. There is no tenderness.  Musculoskeletal: He exhibits no edema.  Lymphadenopathy:    He has no cervical adenopathy.  Neurological: He is alert and oriented to person, place, and time. He exhibits normal muscle  tone.  Skin: Skin is warm and dry. Rash (Bilateral lower ext) noted. There is erythema (Right lower extremity). There is pallor (Conjunctiva).  Right LE hypertrophied/thick  Psychiatric: He has a normal mood and affect.    Assessment/Plan:    Preop clearance Patient is a 58 year old male with a history of hepatitis C, alcoholic cirrhosis, significant tobacco history(70PY-quit 3.5 years ago) peripheral vascular disease, hypertension, anxiety, depression.  2-D echocardiogram on May 8 revealed an ejection fraction of 60-65% with normal wall motion.   He presented with leg weakness and fall x2.  He scheduled for hepatic lesion embolization/ablation planned for 05/04/14.    Mildly elevated troponin with out CP.  Given his history, he likely has CAD, however, we would not take this patient to the cath lab with all his comorbidites.  We would not be able to treat any disease we found.  He had no apparent complications from his angiogram in Jan.    MD opinion to follow.       T wave inversion in EKG   Elevated troponin on May 8, mildly no CP.  Normalized.    PAD   Active Problems:   Thrombocytopenia   Depression   Fall   HTN (hypertension)   Normocytic anemia   Cirrhosis, alcoholic   Hyponatremia   Hypokalemia   Metabolic acidosis   Hepatitis C   Abnormal MRI of abdomen, liver    Leg pain   Rash and nonspecific skin eruption   Cellulitis of leg   Other pancytopenia  Tarri Fuller 05/02/2014, 1:06 PM   I have seen and evaluated the patient this PM along with Tarri Fuller, PA. I agree with his findings, examination as well as impression recommendations.  Very difficult situation - chronic liver disease with PAD -- we are asked to help withe "Pre-op Cardiovascular Risk Assessment."  He had mild Troponin + on admission, or without CP or dyspnea & no WMA on Echo - this is most c/w Type 2 MI (supply vs. Demand ischemia) -- he has pancytopenia & has a hepatic coiling procedure scheduled -- as a  percutaneous procedure = this would be a low risk procedure.  With known PAD type 2 MI - he probably has CAD -albeit without Sx.  He does not have DM or Renal insufficiency & no h/o CVA.   Would be relatively low risk for a low risk procedure.  I do not think that we need to do any additional cardiac evaluation at this time since it will do nothing but delay his care.  We would not be able to do any coroanry revascularization procedures (PCI or CABG) with his pancytopenia, so doing the preliminary studies (unless for risk stratification) would not be beneficial.  Once stable from a general medical perspective - I do think that we could consider cardiac evaluation as an OP.  We will be available as needed.  Thanks for the consult.  Leonie Man, M.D., M.S. Interventional Cardiologist   Pager # [phone] 05/02/2014

## 2014-05-02 NOTE — Telephone Encounter (Signed)
Sent michelle A staff message to add the pt in for his platelets on 05/04/2014.

## 2014-05-02 NOTE — Progress Notes (Signed)
Clinical Social Work  RN reports that patient has changed his mind and now wants SNF. CSW met with patient at bedside in order to determine his wishes. Patient reports he was informed he would not have to supplement SNF with disability check so he would now want to go to SNF. CSW explained that is not true and that he would need to use his disability check. Patient reports after learning this information he will get brother to take him home. Patient aware if he changes his mind about SNF placement then he can work with [facility] SW for placement needs.  [place], Luana [phone]

## 2014-05-02 NOTE — Progress Notes (Signed)
PT is not recommending any DME for pt. Referral for [facility] services has already been made to [place] but MD needs to order the services.  Allene Dillon RN BSN   [phone]

## 2014-05-02 NOTE — Progress Notes (Signed)
Patient given discharge instructions, and verbalized an understanding of all discharge instructions.  Patient aware that he has an appointment on the 20th at 715.  Patient agrees with discharge plan, and is being discharged in stable medical condition.  Patient given transportation via wheelchair. Brother to transport patient home.  Durwin Nora RN

## 2014-05-02 NOTE — Discharge Summary (Signed)
Physician Discharge Summary  Jeffrey Snow:[id number] DOB: 1956-06-29 DOA: 04/21/2014  PCP: [name],[name], MD  Admit date: 04/21/2014 Discharge date: 05/02/2014  Time spent: Greater than 30 minutes  Recommendations for Outpatient Follow-up:  1. Dr. Liston Alba, PCP in 1 week. [facility] on 05/04/2014 at 2:45 PM for labs & Platelet transfusion, prior to procedure on 05/05/2014. 3. Interventional Radiology Clinic on 05/04/2014 at 10 AM-to discuss right hepatic lesion embolization/ablation.  Discharge Diagnoses:  Principal Problem:   Pain in limb Active Problems:   Thrombocytopenia   Atherosclerosis of native arteries of the extremities with intermittent claudication   Depression   PAD (peripheral artery disease)   Arterial insufficiency   Fall   HTN (hypertension)   Normocytic anemia   Cirrhosis, alcoholic   Hyponatremia   Hypokalemia   Metabolic acidosis   Hepatitis C   Abnormal MRI of abdomen, liver    Leg pain   Rash and nonspecific skin eruption   T wave inversion in EKG   Elevated troponin   Cellulitis of leg   Other pancytopenia   Discharge Condition: Improved & Stable  Diet recommendation: Heart healthy diet  Filed Weights   04/21/14 1830  Weight: 81.647 kg (180 lb)    History of present illness:  58 year old male with history of alcohol dependence, alcoholic cirrhosis, hepatitis C, HTN, anxiety & depression, abnormal MRI of liver, peripheral neuropathy, PAD-followed by vascular surgery and recommended conservative care/maximal medical management, presented to the ED on 04/21/14 secondary to falls x3, right lower extremity pain and rash on his legs since last arteriogram.  Hospital Course:   1. Bilateral lower extremity rash/? Cellulitis: Likely secondary to thrombocytopenia purpura. Patient has completed Abx treatment for presumed cellulitis the findings of which have completely resolved. ESR normal making vasculitis less likely.  Cryoglobulin negative making cryoglobulinemia less likely as etiology for rash. 2. Peripheral artery disease: Patient seems to have complaints of intermittent claudication. His case was discussed by Dr. Rockne Menghini with his primary vascular surgeon Dr. Bridgett Larsson who stated that patient had lower extremity rash at the time of the arteriogram and did not feel that this was related to the arteriogram and confirmed that the patient had good collateral blood flow. Aspirin was discontinued secondary to severe thrombocytopenia which would increase the risk of spontaneous bleeding. This may be resumed when his platelet count has consistently improved. 3. Severe pancytopenia: Most likely secondary to alcohol toxic effect and bone marrow suppression, cirrhosis and splenomegaly. Oncology was consulted. DIC panel negative for schistocytes. HIV antibody nonreactive. Cryoglobulins negative. He was transfused a unit of platelets with appropriate bounce in his platelet count making diagnosis of ITP less likely. Over the last few days, his CBCs have improved/stabilized. No reported bleeding. Oncology will FU on 5/20 with repeat labs. 4. Anxiety & depression: Stable. No suicidal or homicidal ideations. Continue Xanax and Prozac. Patient has been counseled not to drink alcohol while he is on any prescription medications including Xanax and opioids. He verbalizes understanding. He has also been advised not to drive while he is on opioids, benzodiazepines or when he consumes alcohol. Psychiatry evaluated patient and determined that he has medical decision making capacity. 5. Recurrent falls/gait ataxia: Patient has been evaluated several times by PT and they consistently recommended SNF. Multiple people including this M.D., PT, nursing, social worker, case management & his family have explored and counseled that going to a SNF may be the safest and best option for him especially given that he  does not have 24/7 supervision or assistance at  home. Patient has consistently declined SNF. He will be going home with home health services. 6. Hypertension: Controlled. Continue Lotensin and HCTZ. Mild hyponatremia is likely secondary to HCTZ. 7. Alcoholic cirrhosis: As per discussion with his brother, patient continues to drink alcohol. He has prior history of polysubstance abuse. He has mild coagulopathy secondary to his cirrhosis. Again abstinence from alcohol has been repeatedly counseled. 8. Hyponatremia: Most likely secondary to HCTZ. Clinically euvolemic. Stable. 9. Hypokalemia: Replaced. 10. History of hepatitis C: 11. Hepatocellular carcinoma: Right liver lesion, increasing in size since January, in the setting of cirrhosis and hepatitis C positivity, elevated AFP of 75 supports clinical diagnosis of hepatoma. Medical oncology was consulted. Patient not a candidate for biopsy secondary to coagulopathy and severe thrombocytopenia. Surgery was consulted who determined that he is not a surgical candidate and has high surgical morbidity. Interventional radiology was consulted and plan to followup in their clinic on 05/04/14 for possible transcatheter embolization. He will be transfused platelets the day before. 12. Elevated troponin/Type II MI: In the absence of chest pain or acute EKG changes, are attributed to Type 2 NSTEMI. 2-D echo shows normal LV function. Cardiology was consulted for pre-procedure clearance today and recommend no further workup and indicate that he is at low risk for low risk procedure. 13. Metabolic acidosis: Patient had elevated lactate. Resolved with IV hydration.  Consultations:  Medical oncology  General surgery  Interventional radiology  Psychiatry  Cardiology  Procedures:  None    Discharge Exam:  Complaints: states that he fell this morning on the way to the toilet but did not hit his head or sustain injuries. Denies LOC. Denies complaints.  Filed Vitals:   05/01/14 0620 05/01/14 1500 05/01/14  2127 05/02/14 0533  BP: 126/53 111/71 105/68 113/66  Pulse: 69 66 66 73  Temp: 98.2 F (36.8 C) 97.5 F (36.4 C) 98.7 F (37.1 C) 97.9 F (36.6 C)  TempSrc: Oral Oral Oral Oral  Resp: '18 18 18 18  ' Height:      Weight:      SpO2: 98% 97% 97% 97%    General exam: Moderately built and nourished middle-aged male who looks older than stated age, lying comfortably in bed. Respiratory system: Clear. No increased work of breathing. Cardiovascular system: S1 & S2 heard, RRR. No JVD, murmurs, gallops, clicks or pedal edema. Gastrointestinal system: Abdomen is nondistended, soft and nontender. Normal bowel sounds heard. Central nervous system: Alert and oriented. No focal neurological deficits. Extremities: Symmetric 5 x 5 power. Bilateral lower extremity purpuric rash without any findings to suggest cellulitis.  Discharge Instructions      Discharge Instructions   Care order/instruction    Complete by:  Apr 28, 2014   Transfuse Parameters     Call MD for:  persistant nausea and vomiting    Complete by:  As directed      Call MD for:  severe uncontrolled pain    Complete by:  As directed      Call MD for:  temperature >100.4    Complete by:  As directed      Diet - low sodium heart healthy    Complete by:  As directed      Increase activity slowly    Complete by:  As directed      Practitioner attestation of consent    Complete by:  As directed   I, the ordering practitioner, attest that I have discussed with  the patient the benefits, risks, side effects, alternatives, likelihood of achieving goals and potential problems during recovery for the procedure listed.  Procedure:  Blood Product(s)            Medication List    STOP taking these medications       aspirin 81 MG chewable tablet      TAKE these medications       ALPRAZolam 0.5 MG tablet  Commonly known as:  XANAX  Take 0.5 mg by mouth 3 (three) times daily.     benazepril 40 MG tablet  Commonly known as:   LOTENSIN  Take 1 tablet (40 mg total) by mouth daily.     ergocalciferol 50000 UNITS capsule  Commonly known as:  DRISDOL  Take 1 capsule (50,000 Units total) by mouth once a week.     feeding supplement (ENSURE COMPLETE) Liqd  Take 237 mLs by mouth daily.     FLUoxetine 20 MG capsule  Commonly known as:  PROZAC  Take 20 mg by mouth daily.     folic acid 1 MG tablet  Commonly known as:  FOLVITE  Take 1 tablet (1 mg total) by mouth daily.     hydrochlorothiazide 25 MG tablet  Commonly known as:  HYDRODIURIL  Take 1 tablet (25 mg total) by mouth daily.     HYDROcodone-acetaminophen 5-325 MG per tablet  Commonly known as:  NORCO/VICODIN  Take 1 tablet by mouth every 8 (eight) hours as needed for moderate pain or severe pain.     hydrocortisone 2.5 % rectal cream  Commonly known as:  ANUSOL-HC  Place 1 application rectally 2 (two) times daily as needed for hemorrhoids or itching.     permethrin 5 % cream  Commonly known as:  ELIMITE  Apply cream from head to toe; leave on for 8-14 hours before washing off with water; may reapply in 1 week if live mites appear.     thiamine 100 MG tablet  Take 1 tablet (100 mg total) by mouth daily.     zolpidem 10 MG tablet  Commonly known as:  AMBIEN  Take 10 mg by mouth at bedtime.       Follow-up Information   Follow up with [name],[name], MD. Schedule an appointment as soon as possible for a visit in 1 week.   Specialty:  Internal Medicine   Contact information:   [address] [place] [address] [phone]        The results of significant diagnostics from this hospitalization (including imaging, microbiology, ancillary and laboratory) are listed below for reference.    Significant Diagnostic Studies: Dg Tibia/fibula Right  04/21/2014   CLINICAL DATA:  Fall.  EXAM: RIGHT TIBIA AND FIBULA - 2 VIEW  COMPARISON:  None.  FINDINGS: No acute bony or joint abnormality identified. No fracture or dislocation.  Peripheral vascular calcification present.  IMPRESSION: No acute bony or joint abnormality.   Electronically Signed   By: Marcello Moores  Register   On: 04/21/2014 13:08   Mr Abdomen W Wo Contrast  04/25/2014   CLINICAL DATA:  Follow-up liver lesion, elevated AFP  EXAM: MRI ABDOMEN WITHOUT AND WITH CONTRAST  TECHNIQUE: Multiplanar multisequence MR imaging of the abdomen was performed both before and after the administration of intravenous contrast.  CONTRAST:  26m MULTIHANCE GADOBENATE DIMEGLUMINE 529 MG/ML IV SOLN  COMPARISON:  01/30/2013  FINDINGS: Motion degraded images.  Morphologic findings of cirrhosis.  No hepatic steatosis.  2.7 x 3.2 x  2.4 cm T2 hyperintense lesion with early arterial enhancement in the posterior segment right hepatic lobe (series 1101/ image 43), previously 1.6 cm. No definite washout on delayed phase images (series 13/ image 37). Lesion is conspicuous on T1 in-phase imaging (series 7/ image 42) with associated signal loss on opposed phase, suggesting intracellular lipid. While this lesion does not meet AASLD criteria for imaging diagnosis of hepatocellular carcinoma due to lack of portal venous washout, an enlarging enhancing lesion in the presence of cirrhosis is highly suspicious for hepatocellular carcinoma.  Splenomegaly, measuring 16.8 cm and craniocaudal dimension.  Small volume perihepatic and perisplenic ascites.  Portal vein is patent.  Mild gallbladder wall thickening/edema, likely secondary, although with layering small gallstones (series 10/image 28). No intrahepatic or extrahepatic ductal dilatation.  Small upper abdominal lymph nodes measuring up to 10 mm short axis (series 1102/image 49), likely reactive.  Trace left pleural effusion.  No focal osseous lesions.  IMPRESSION: 3.2 cm enhancing lesion in the posterior segment right hepatic lobe, increased, highly suspicious for hepatocellular carcinoma.  Cirrhosis with splenomegaly and small volume abdominal ascites. Portal vein is  patent.  Cholelithiasis with suspected secondary gallbladder wall thickening/edema. If there is clinical concern for cholecystitis, consider hepatobiliary nuclear medicine scan.   Electronically Signed   By: Julian Hy M.D.   On: 04/25/2014 08:16    Microbiology: No results found for this or any previous visit (from the past 240 hour(s)).   Labs: Basic Metabolic Panel:  Recent Labs Lab 04/26/14 0425 04/28/14 0445 04/28/14 1430 04/29/14 0700  NA 132* 133* 131* 131*  K 5.0 4.7 4.8 4.5  CL 101 101 99 100  CO2 '19 20 20 22  ' GLUCOSE 82 63* 69* 86  BUN '13 13 13 14  ' CREATININE 0.89 0.90 0.92 0.95  CALCIUM 8.0* 8.1* 8.3* 8.3*   Liver Function Tests:  Recent Labs Lab 04/26/14 0425 04/28/14 1430 04/29/14 0700  AST 59* 64* 63*  ALT '30 30 30  ' ALKPHOS 59 58 66  BILITOT 2.5* 2.4* 2.2*  PROT 5.1* 5.4* 4.8*  ALBUMIN 2.1* 2.3* 2.1*   No results found for this basename: LIPASE, AMYLASE,  in the last 168 hours No results found for this basename: AMMONIA,  in the last 168 hours CBC:  Recent Labs Lab 04/28/14 1430 04/28/14 2214 04/29/14 0700 04/30/14 0519 05/01/14 0527 05/02/14 0511  WBC 1.5*  --  1.3* 1.3* 1.6* 1.5*  NEUTROABS 0.6*  --  0.4* 0.4* 0.5* 0.6*  HGB 8.5*  --  7.8* 7.4* 7.6* 7.9*  HCT 24.2*  --  21.6* 21.4* 21.6* 22.6*  MCV 95.3  --  94.7 97.3 95.6 97.0  PLT 33* 40* 47* 44* 42* 41*   Cardiac Enzymes: No results found for this basename: CKTOTAL, CKMB, CKMBINDEX, TROPONINI,  in the last 168 hours BNP: BNP (last 3 results) No results found for this basename: PROBNP,  in the last 8760 hours CBG: No results found for this basename: GLUCAP,  in the last 168 hours  Additional labs: 1. Peak troponin elevation: 0.73 2. LDH: 158 3. Fasting lipid panel: Cholesterol 69, triglycerides 70, HDL 11, LDL 44 and VLDL 14 4. Anemia panel: Iron 65, TIBC 165, saturation ratios 39, ferritin 671, folate 9.7, B12: 877, reticulocytes 55 5. Venous lactate: 3.2 > 1.2 6. ESR:  4 7. D-dimer: 4.32, fibrinogen: 174, INR: 1.96 8. Alpha-fetoprotein: 75 9. HCV quantitative: 383291, HCV quantitative log: 5.49 10. HIV antibodies: Nonreactive 11. UDS: Positive for benzodiazepines 12. 2-D echo 04/22/14: Study  Conclusions  - Left ventricle: The cavity size was normal. Wall thickness was normal. Systolic function was normal. The estimated ejection fraction was in the range of 60% to 65%. Wall motion was normal; there were no regional wall motion abnormalities. Left ventricular diastolic function parameters were normal. - Mitral valve: Mild regurgitation. - Pulmonary arteries: Systolic pressure was mildly to moderately increased. PA peak pressure: 31m Hg (S). 13. Bilateral lower extremity venous Dopplers 04/22/49: Summary:  - No evidence of deep vein thrombosis involving the right lower extremity. - No evidence of deep vein thrombosis involving the left lower extremity.      Signed:  AModena Jansky MD, FACP, F[facility] Pager [phone] If 7PM-7AM, please contact night-coverage [email or web address] Password T[facility]5/18/2015, 4:32 PM

## 2014-05-02 NOTE — Telephone Encounter (Signed)
Per staff message and POF I have scheduled appts.  JMW  

## 2014-05-02 NOTE — Progress Notes (Signed)
Received referral for [facility] services. However, [facility] Care Management is not contracted with Medicaid. Made inpatient RNCM aware and suggested [facility] be called as they may be able to follow patient. Marthenia Rolling, MSN- RN, [facility] [phone]

## 2014-05-02 NOTE — Progress Notes (Signed)
Physical Therapy Treatment Patient Details Name: Jeffrey Snow MRN: [id number] DOB: 1956/04/06 Today's Date: 05/02/2014    History of Present Illness 58 yo male adm after multiple falls at home and LE pain/rash; PMH of peripheral neuropathy, hypertension, peripheral arterial disease/arterial insufficiency with chronic intermittent claudication, dopplers negative    PT Comments    Pt is difficult to comprehend at times and requires MAX encouragement to participate.  Assisted pt OOB to amb to BR.  Very unsteady gait with poor to zero self corrective response.  HIGH FALL RISK.  Required x 4 attempts to stand from sitting on EOB.  Required repeat cueing to instruct pt on tech and hand placement to increase self ability.  Amb to BR very unsteady/sluggish gait. Posterior LOB with sit to stand and stand to sit.   Pt refusing SNF in fear he will loose his handicapped apartment that he waited 4 years for.  In which case pt will need 24/7 assist at home.  Follow Up Recommendations  SNF (pt refusing SNF so will need 24/7 car5e at hyome)     Equipment Recommendations  None recommended by PT (difficult to achieve from pt but stated he has a RW at home)    Recommendations for Other Services       Precautions / Restrictions Precautions Precautions: Fall Restrictions Weight Bearing Restrictions: No    Mobility  Bed Mobility Overal bed mobility: Needs Assistance Bed Mobility: Supine to Sit;Sit to Supine     Supine to sit: Min assist Sit to supine: Min assist   General bed mobility comments: increased time and MAX VC's to increase self assist and motivate to participate  Transfers Overall transfer level: Needs assistance Equipment used: Rolling walker (2 wheeled) Transfers: Sit to/from Stand Sit to Stand: +2 physical assistance;Min assist;Mod assist         General transfer comment: 25% VC's on proper hand placement and to incease self assist level.     Ambulation/Gait Ambulation/Gait assistance: Min assist;Mod assist Ambulation Distance (Feet): 25 Feet Assistive device: Rolling walker (2 wheeled) Gait Pattern/deviations: Step-to pattern Gait velocity: decreased   General Gait Details: Very unsteady gait and required MAX encouragement to increase amb distance.  Poor standing balance and stance of flex hips and knees.     Stairs            Wheelchair Mobility    Modified Rankin (Stroke Patients Only)       Balance                                    Cognition                            Exercises      General Comments        Pertinent Vitals/Pain C/o R LE pain unspecific    Home Living                      Prior Function            PT Goals (current goals can now be found in the care plan section) Progress towards PT goals: Progressing toward goals    Frequency  Min 3X/week    PT Plan      Co-evaluation             End of Session Equipment Utilized During  Treatment: Gait belt Activity Tolerance: Patient tolerated treatment well Patient left: in bed;with bed alarm set;with call bell/phone within reach     Time: 1440-1505 PT Time Calculation (min): 25 min  Charges:  $Therapeutic Activity: 23-37 mins                    G Codes:      Rica Koyanagi  PTA [facility]  Acute  Rehab Pager      [phone]

## 2014-05-02 NOTE — Progress Notes (Addendum)
Patient ID: Jeffrey Snow, male   DOB: Jun 27, 1956, 58 y.o.   MRN: [id number] Pt tent scheduled for OP visit with Dr. Laurence Ferrari in [place] clinic to further discuss rt hepatic lesion embolization/ablation on 5/20 at 1000. Currently awaiting cardiac clearance. Pt will need platelet transfusion preprocedure if count under 50k. Potential plans for procedure would include hepatic embolization (chemo/particle) on 5/21 via conscious sedation followed by hepatic MW ablation of tumor on 5/22 (gen anesthesia).  Pt has concerns regarding limited mobility/increased fall risk if d/c 'd home or to facility prior to procedure. It is critical that he abstain from further alcohol use upon discharge if procedure is to go as planned. Above d/w pt and contact number /appt time for upcoming IR visit given to pt. We will cont to monitor situation.

## 2014-05-02 NOTE — Plan of Care (Signed)
Problem: Discharge Progression Outcomes Goal: Activity appropriate for discharge plan Outcome: Not Met (add Reason) Patient has insisted on going home despite the knowledge that he will not adequately be able to take care of himself at home.  Brother to assist but will not be there 24/7.  Patient aware of risks of taking care of himself at home, and has decided to accept those risks.  Patient aware that he is at a high risk for a fall, and has been instructed to call 911, if he falls at home.

## 2014-05-03 ENCOUNTER — Other Ambulatory Visit: Payer: Self-pay | Admitting: Internal Medicine

## 2014-05-03 ENCOUNTER — Other Ambulatory Visit: Payer: Self-pay | Admitting: Radiology

## 2014-05-03 ENCOUNTER — Other Ambulatory Visit: Payer: Self-pay | Admitting: Interventional Radiology

## 2014-05-03 DIAGNOSIS — C220 Liver cell carcinoma: Secondary | ICD-10-CM

## 2014-05-03 LAB — CRYOGLOBULIN

## 2014-05-03 NOTE — Progress Notes (Signed)
Spoke with Allene Dillon by phone and informed her that patient stated he had fallen yesterday in kitchen and outside his apartment in the last few days.  She stated she would contact [facility] regarding patient who he has been set up with .  Rod Holler stated patient refused to go to [facility] when he was discharged.  I told her I had sent note in EPIC to her and to Sindy Messing , Education officer, museum.  I informed Rod Holler that I had told patient to be safe and informed him to call 911 if needed.

## 2014-05-03 NOTE — Progress Notes (Signed)
Patient has received 2 phone calls from " ? Someone wanting to come out and clean his house" .  He is confused about the 2 phone calls .  Could you please call the patient and clarify .  Thank You.

## 2014-05-03 NOTE — Progress Notes (Signed)
I spoke with patient by phone at [phone] ( other number- [phone]) and he stated he fell in kitchen yesterday but did not get hurt.  Patient also confused over the phone calls he has received regarding someone coming out to clean his apartment.  He states 2 different people have called him .  I told him I would send messages to Care Management and Social Worker.  Patient understood.

## 2014-05-04 ENCOUNTER — Other Ambulatory Visit: Payer: Medicaid Other

## 2014-05-04 ENCOUNTER — Other Ambulatory Visit: Payer: Self-pay | Admitting: Radiology

## 2014-05-04 ENCOUNTER — Other Ambulatory Visit: Payer: Self-pay | Admitting: *Deleted

## 2014-05-04 ENCOUNTER — Encounter: Payer: Self-pay | Admitting: *Deleted

## 2014-05-04 ENCOUNTER — Encounter (HOSPITAL_COMMUNITY): Payer: Self-pay

## 2014-05-04 ENCOUNTER — Inpatient Hospital Stay: Admission: RE | Admit: 2014-05-04 | Payer: Self-pay | Source: Ambulatory Visit

## 2014-05-04 ENCOUNTER — Ambulatory Visit: Payer: Medicaid Other | Admitting: Adult Health

## 2014-05-04 ENCOUNTER — Telehealth: Payer: Self-pay | Admitting: *Deleted

## 2014-05-04 DIAGNOSIS — D696 Thrombocytopenia, unspecified: Secondary | ICD-10-CM

## 2014-05-04 DIAGNOSIS — D649 Anemia, unspecified: Secondary | ICD-10-CM

## 2014-05-04 LAB — PREPARE PLATELET PHERESIS
Unit division: 0
Unit division: 0

## 2014-05-04 NOTE — Progress Notes (Signed)
Patient called in today to see he" did not have a ride for appointments and he has been unable to take a bath and unable to go outside due to afraid he will fall".  Informed patient that I would call [facility] and speak with dr Magrinat's nurse and inform them of above situation.  [facility] [place] and spoke with Manuela Schwartz, RN , Triage Nurse for Cancer center and informed her above .  I told her I was afraid the patient is not fully aware of all appointments and I was afraid for his safety.  She stated she would call their Social Worker  And involve them and call patient.  I gave her the number I had reached patient at [phone].

## 2014-05-04 NOTE — Progress Notes (Addendum)
[place] Work  Clinical Social Work was referred by nurse for assessment of psychosocial needs due to transportation, depression and weakness.  Clinical Social Worker contacted patient at home to offer support and assess for needs.  CSW offered to try to arrange transportation to get here for appointment today. Pt declined and stated he was too weak to get into a car, if someone came to get him. Pt stated he has had multiple falls and is very weak. He has fallen on his walker and can't pull himself up to move off the sofa. CSW asked about PT and SNF rehab. Pt denied "anyone speaking with him about rehab". Pt had flight of ideas during the phone call and stated that his doctor was from [place] and "not right". Pt had slurred speech through out the call. He then did remember he was spoken to about rehab, but appeared quite concerned about going due to financial concerns.   Pt has not filled his prescriptions due to transportation. Pt not aware of his appointments as well. CSW again inquired if Pt would agree to come to appointment and he declined. He stated it was too "late in the week and he was too weak". Pt continued to discuss that he was not interested in going to the hospital as well. CSW inquired if pt felt he should return to the hospital and he declined. Pt aware of how to contact EMS if needed. He continued to state that "no doctors have been able to fix him for four years". Pt again declined transportation to his appointment "today, tomorrow or next week". He is aware of [facility] visit tomorrow and awaits their arrival.     Clinical Social Work interventions: Patient education Assessment and counseling Problem solving     Loren Racer, [place] Worker [facility] [place] for [facility] Wednesday, Thursday and Friday Phone: [phone] Fax: [phone]

## 2014-05-04 NOTE — Telephone Encounter (Signed)
Spoke with Dawn at [facility]: She will call patient again today to get set up for visit by nurse tomorrow. Call yesterday was from a nurse from [place] and that could have made it difficult for him to understand or believe her. Dawn will call and let him know that Dr. Virgie Dad office called them and requested they contact him and arrange for nurse to see him tomorrow.

## 2014-05-04 NOTE — Progress Notes (Signed)
[facility] Work  Clinical Social Work called brothers as directed by MD that pt was refusing appointments currently. CSW educated brother on transportation resources and others that could assist. Brother [name], was appreciative of call.   Loren Racer, LCSW Clinical Social Worker [facility] [place] for [facility] Wednesday, Thursday and Friday Phone: [phone] Fax: [phone]

## 2014-05-04 NOTE — Telephone Encounter (Signed)
Called patient: confirms several falls last week and feel again on Monday while in kitchen. Has walker in home, but is so weak it is difficult to ambulate. Tells nurse "I'm afraid to get off my sofa I'm so weak. I can't even bathe myself". Feels as if heart is racing with any activity. Too weak to fix his meals-ate pecan pie yesterday. Says he is too weak to shop for food. Lacks reliable transportation, and says his brother's son just committed suicide so he brother can't help him right now. Adds "I might be next". Directly addressed this with him and asked if he has a suicide plan or a gun in the home or medications to take that could hurt him. He answers no, he is just depressed about the situation he is in.  He denies any bleeding, reports he has "infection in my right leg and foot". Does not think he has a fever-does not have thermometer. Tells nurse again how dirty his home is and how trash needs to be emptied and he smells due to no bath. Has not had a nurse come to his home yet from [facility]. Says someone called to say his house was on list to be cleaned today, but he was not able to understand her well (Hispanic accent) and then they were disconnected. He is afraid it could be a scam. He is against nursing home or rehab at this time-afraid he will lose his home and social security. He is difficult to [place] seemed somewhat slurred. Will make MD/NP aware of the situation as well as Education officer, museum.

## 2014-05-04 NOTE — Telephone Encounter (Signed)
Left VM for social worker department requesting assistance with transportation, etc as indicated. Inquired if they could assist him getting here today for his appointment? Would he qualify for a taxi here and back home?

## 2014-05-04 NOTE — Telephone Encounter (Signed)
Reports speaking with patient today and he was not aware of his appointments here at 2:45 pm. Also has been having frequent falls and is afraid to get up off due to weakness, and has not had a bath in a week.  Lacks transportation. She has sent message to case manager.

## 2014-05-05 ENCOUNTER — Ambulatory Visit (HOSPITAL_COMMUNITY): Admission: RE | Admit: 2014-05-05 | Payer: Medicaid Other | Source: Ambulatory Visit

## 2014-05-05 ENCOUNTER — Inpatient Hospital Stay (HOSPITAL_COMMUNITY): Admission: RE | Admit: 2014-05-05 | Payer: Self-pay | Source: Ambulatory Visit

## 2014-05-05 ENCOUNTER — Other Ambulatory Visit: Payer: Self-pay | Admitting: *Deleted

## 2014-05-05 ENCOUNTER — Ambulatory Visit (HOSPITAL_COMMUNITY)
Admission: RE | Admit: 2014-05-05 | Payer: Medicaid Other | Source: Ambulatory Visit | Admitting: Interventional Radiology

## 2014-05-05 ENCOUNTER — Telehealth: Payer: Self-pay | Admitting: Oncology

## 2014-05-05 NOTE — Telephone Encounter (Signed)
, °

## 2014-05-06 ENCOUNTER — Encounter (HOSPITAL_COMMUNITY): Admission: RE | Payer: Self-pay | Source: Ambulatory Visit

## 2014-05-06 ENCOUNTER — Ambulatory Visit (HOSPITAL_COMMUNITY): Payer: Medicaid Other

## 2014-05-06 SURGERY — RADIO FREQUENCY ABLATION
Anesthesia: General

## 2014-05-11 ENCOUNTER — Telehealth: Payer: Self-pay

## 2014-05-11 DIAGNOSIS — Z9119 Patient's noncompliance with other medical treatment and regimen: Secondary | ICD-10-CM

## 2014-05-11 DIAGNOSIS — Z91199 Patient's noncompliance with other medical treatment and regimen due to unspecified reason: Secondary | ICD-10-CM

## 2014-05-11 NOTE — Telephone Encounter (Signed)
[place] Requesting a Social Worker Order Due to the issue of Mr. Meno being Non Compliant since being Discharged  from Hospital.Pt was Discharged on 05/05/2014 and Family members are worried of his situation.Pt's brother states Pt was Discharged from hospital,does not want to keep up with his f/u  Appointments,as well as family believes he could poss.be taking too many of his Vicodin.Pt was Discharged on 05/05/2014 w/ script of 15 tabs now 05/11/2014 brother states Mr. Stucki is down to 4 tabs. Family is worried and is asking for assistance.Plz advise.

## 2014-05-11 NOTE — Telephone Encounter (Signed)
[facility] referral made for SW services.

## 2014-05-13 ENCOUNTER — Encounter: Payer: Self-pay | Admitting: *Deleted

## 2014-05-13 NOTE — Progress Notes (Unsigned)
[place] Work  Clinical Social Work reviewed chart and attempted to contact pt for reassessment of psychosocial needs.  Pt's phone went straight to vm and CSW left message. Clinical Social Worker was made aware referral for home health social work visit was made as well. CSW from [facility] availible as needed, but pt's needs appear to be addressed in community at this time.     Loren Racer, LCSW Clinical Social Worker [facility] [place] for [place] Wednesday, Thursday and Friday Phone: [phone] Fax: [phone]

## 2014-05-17 ENCOUNTER — Other Ambulatory Visit: Payer: Self-pay | Admitting: Emergency Medicine

## 2014-05-17 ENCOUNTER — Ambulatory Visit
Admission: RE | Admit: 2014-05-17 | Discharge: 2014-05-17 | Disposition: A | Discharge status: Home / Self Care | Payer: Medicaid Other | Source: Ambulatory Visit | Attending: Radiology | Admitting: Radiology

## 2014-05-17 DIAGNOSIS — K7030 Alcoholic cirrhosis of liver without ascites: Secondary | ICD-10-CM

## 2014-05-17 DIAGNOSIS — C220 Liver cell carcinoma: Secondary | ICD-10-CM

## 2014-05-17 DIAGNOSIS — K7460 Unspecified cirrhosis of liver: Secondary | ICD-10-CM

## 2014-05-17 LAB — COMPREHENSIVE METABOLIC PANEL
ALT: 35 U/L (ref 0–53)
AST: 81 U/L — ABNORMAL HIGH (ref 0–37)
Albumin: 3 g/dL — ABNORMAL LOW (ref 3.5–5.2)
Alkaline Phosphatase: 89 U/L (ref 39–117)
BUN: 8 mg/dL (ref 6–23)
CO2: 27 mEq/L (ref 19–32)
Calcium: 8.4 mg/dL (ref 8.4–10.5)
Chloride: 96 mEq/L (ref 96–112)
Creat: 0.83 mg/dL (ref 0.50–1.35)
Glucose, Bld: 120 mg/dL — ABNORMAL HIGH (ref 70–99)
Potassium: 3.1 mEq/L — ABNORMAL LOW (ref 3.5–5.3)
Sodium: 133 mEq/L — ABNORMAL LOW (ref 135–145)
Total Bilirubin: 3.8 mg/dL — ABNORMAL HIGH (ref 0.2–1.2)
Total Protein: 6.3 g/dL (ref 6.0–8.3)

## 2014-05-17 LAB — CBC
HCT: 27.8 % — ABNORMAL LOW (ref 39.0–52.0)
Hemoglobin: 9.9 g/dL — ABNORMAL LOW (ref 13.0–17.0)
MCH: 34.9 pg — ABNORMAL HIGH (ref 26.0–34.0)
MCHC: 35.6 g/dL (ref 30.0–36.0)
MCV: 97.9 fL (ref 78.0–100.0)
Platelets: 51 10*3/uL — ABNORMAL LOW (ref 150–400)
RBC: 2.84 MIL/uL — ABNORMAL LOW (ref 4.22–5.81)
RDW: 17.6 % — ABNORMAL HIGH (ref 11.5–15.5)
WBC: 1.8 10*3/uL — ABNORMAL LOW (ref 4.0–10.5)

## 2014-05-17 LAB — PROTIME-INR
INR: 1.62 — ABNORMAL HIGH (ref <1.50)
Prothrombin Time: 18.9 seconds — ABNORMAL HIGH (ref 11.6–15.2)

## 2014-05-18 ENCOUNTER — Other Ambulatory Visit: Payer: Self-pay | Admitting: Emergency Medicine

## 2014-05-18 DIAGNOSIS — K7030 Alcoholic cirrhosis of liver without ascites: Secondary | ICD-10-CM

## 2014-05-24 ENCOUNTER — Other Ambulatory Visit (HOSPITAL_COMMUNITY): Payer: Self-pay | Admitting: Internal Medicine

## 2014-05-26 ENCOUNTER — Telehealth: Payer: Self-pay | Admitting: Internal Medicine

## 2014-05-26 NOTE — Telephone Encounter (Signed)
Heather from [place] called to verify patient medication. Please call [phone].

## 2014-06-02 ENCOUNTER — Encounter: Payer: Self-pay | Admitting: Internal Medicine

## 2014-06-03 ENCOUNTER — Other Ambulatory Visit: Payer: Self-pay | Admitting: Radiology

## 2014-06-04 LAB — CBC
HCT: 32.6 % — ABNORMAL LOW (ref 39.0–52.0)
Hemoglobin: 10.8 g/dL — ABNORMAL LOW (ref 13.0–17.0)
MCH: 34.7 pg — ABNORMAL HIGH (ref 26.0–34.0)
MCHC: 33.1 g/dL (ref 30.0–36.0)
MCV: 104.8 fL — ABNORMAL HIGH (ref 78.0–100.0)
Platelets: 50 10*3/uL — ABNORMAL LOW (ref 150–400)
RBC: 3.11 MIL/uL — ABNORMAL LOW (ref 4.22–5.81)
RDW: 15.5 % (ref 11.5–15.5)
WBC: 2.9 10*3/uL — ABNORMAL LOW (ref 4.0–10.5)

## 2014-06-04 LAB — COMPREHENSIVE METABOLIC PANEL
ALT: 49 U/L (ref 0–53)
AST: 93 U/L — ABNORMAL HIGH (ref 0–37)
Albumin: 2.9 g/dL — ABNORMAL LOW (ref 3.5–5.2)
Alkaline Phosphatase: 93 U/L (ref 39–117)
BUN: 11 mg/dL (ref 6–23)
CO2: 23 mEq/L (ref 19–32)
Calcium: 8.8 mg/dL (ref 8.4–10.5)
Chloride: 105 mEq/L (ref 96–112)
Creat: 0.85 mg/dL (ref 0.50–1.35)
Glucose, Bld: 99 mg/dL (ref 70–99)
Potassium: 4 mEq/L (ref 3.5–5.3)
Sodium: 137 mEq/L (ref 135–145)
Total Bilirubin: 2.1 mg/dL — ABNORMAL HIGH (ref 0.2–1.2)
Total Protein: 5.9 g/dL — ABNORMAL LOW (ref 6.0–8.3)

## 2014-06-04 LAB — PROTIME-INR
INR: 1.36 (ref <1.50)
Prothrombin Time: 16.6 seconds — ABNORMAL HIGH (ref 11.6–15.2)

## 2014-06-06 ENCOUNTER — Encounter (HOSPITAL_COMMUNITY): Payer: Self-pay | Admitting: Pharmacy Technician

## 2014-06-06 ENCOUNTER — Other Ambulatory Visit: Payer: Self-pay | Admitting: Radiology

## 2014-06-07 MED ORDER — DOXORUBICIN HCL 50 MG IV SOLR
75.0000 mg | Freq: Once | INTRAVENOUS | Status: AC
  Administered 2014-06-09: 75 mg via INTRA_ARTERIAL
  Filled 2014-06-07: qty 75

## 2014-06-08 ENCOUNTER — Other Ambulatory Visit: Payer: Self-pay | Admitting: Radiology

## 2014-06-08 ENCOUNTER — Other Ambulatory Visit (HOSPITAL_COMMUNITY): Payer: Self-pay | Admitting: Internal Medicine

## 2014-06-09 ENCOUNTER — Observation Stay (HOSPITAL_COMMUNITY)
Admission: RE | Admit: 2014-06-09 | Discharge: 2014-06-11 | Disposition: A | Discharge status: Home / Self Care | Payer: Medicaid Other | Source: Ambulatory Visit | Attending: Interventional Radiology | Admitting: Interventional Radiology

## 2014-06-09 ENCOUNTER — Ambulatory Visit (HOSPITAL_COMMUNITY)
Admission: RE | Admit: 2014-06-09 | Discharge: 2014-06-09 | Disposition: A | Discharge status: Home / Self Care | Payer: Medicaid Other | Source: Ambulatory Visit | Attending: Interventional Radiology | Admitting: Interventional Radiology

## 2014-06-09 ENCOUNTER — Other Ambulatory Visit: Payer: Self-pay | Admitting: Interventional Radiology

## 2014-06-09 ENCOUNTER — Encounter (HOSPITAL_COMMUNITY): Payer: Self-pay

## 2014-06-09 VITALS — BP 107/58 | HR 67 | Temp 97.5°F | Resp 16 | Ht 68.0 in | Wt 145.0 lb

## 2014-06-09 DIAGNOSIS — Z7982 Long term (current) use of aspirin: Secondary | ICD-10-CM | POA: Insufficient documentation

## 2014-06-09 DIAGNOSIS — K7030 Alcoholic cirrhosis of liver without ascites: Secondary | ICD-10-CM | POA: Insufficient documentation

## 2014-06-09 DIAGNOSIS — F3289 Other specified depressive episodes: Secondary | ICD-10-CM | POA: Diagnosis not present

## 2014-06-09 DIAGNOSIS — F1020 Alcohol dependence, uncomplicated: Secondary | ICD-10-CM | POA: Insufficient documentation

## 2014-06-09 DIAGNOSIS — Z87891 Personal history of nicotine dependence: Secondary | ICD-10-CM | POA: Diagnosis not present

## 2014-06-09 DIAGNOSIS — B1920 Unspecified viral hepatitis C without hepatic coma: Secondary | ICD-10-CM | POA: Diagnosis not present

## 2014-06-09 DIAGNOSIS — I739 Peripheral vascular disease, unspecified: Secondary | ICD-10-CM | POA: Diagnosis not present

## 2014-06-09 DIAGNOSIS — F411 Generalized anxiety disorder: Secondary | ICD-10-CM | POA: Insufficient documentation

## 2014-06-09 DIAGNOSIS — C228 Malignant neoplasm of liver, primary, unspecified as to type: Secondary | ICD-10-CM | POA: Diagnosis present

## 2014-06-09 DIAGNOSIS — F329 Major depressive disorder, single episode, unspecified: Secondary | ICD-10-CM | POA: Insufficient documentation

## 2014-06-09 DIAGNOSIS — C220 Liver cell carcinoma: Secondary | ICD-10-CM

## 2014-06-09 DIAGNOSIS — I10 Essential (primary) hypertension: Secondary | ICD-10-CM | POA: Diagnosis not present

## 2014-06-09 DIAGNOSIS — Z79899 Other long term (current) drug therapy: Secondary | ICD-10-CM | POA: Diagnosis not present

## 2014-06-09 DIAGNOSIS — D696 Thrombocytopenia, unspecified: Secondary | ICD-10-CM | POA: Diagnosis not present

## 2014-06-09 DIAGNOSIS — F410 Panic disorder [episodic paroxysmal anxiety] without agoraphobia: Secondary | ICD-10-CM | POA: Diagnosis not present

## 2014-06-09 HISTORY — DX: Liver cell carcinoma: C22.0

## 2014-06-09 HISTORY — DX: Hepatomegaly, not elsewhere classified: R16.0

## 2014-06-09 LAB — COMPREHENSIVE METABOLIC PANEL
ALT: 43 U/L (ref 0–53)
AST: 74 U/L — ABNORMAL HIGH (ref 0–37)
Albumin: 2.8 g/dL — ABNORMAL LOW (ref 3.5–5.2)
Alkaline Phosphatase: 96 U/L (ref 39–117)
BUN: 7 mg/dL (ref 6–23)
CO2: 22 mEq/L (ref 19–32)
Calcium: 8.5 mg/dL (ref 8.4–10.5)
Chloride: 101 mEq/L (ref 96–112)
Creatinine, Ser: 0.8 mg/dL (ref 0.50–1.35)
GFR calc Af Amer: >90 mL/min (ref >90)
GFR calc non Af Amer: >90 mL/min (ref >90)
Glucose, Bld: 92 mg/dL (ref 70–99)
Potassium: 3.9 mEq/L (ref 3.7–5.3)
Sodium: 136 mEq/L — ABNORMAL LOW (ref 137–147)
Total Bilirubin: 1.9 mg/dL — ABNORMAL HIGH (ref 0.3–1.2)
Total Protein: 5.8 g/dL — ABNORMAL LOW (ref 6.0–8.3)

## 2014-06-09 LAB — CBC WITH DIFFERENTIAL/PLATELET
Basophils Absolute: 0 10*3/uL (ref 0.0–0.1)
Basophils Relative: 0 % (ref 0–1)
Eosinophils Absolute: 0 10*3/uL (ref 0.0–0.7)
Eosinophils Relative: 1 % (ref 0–5)
HCT: 29.7 % — ABNORMAL LOW (ref 39.0–52.0)
Hemoglobin: 10.4 g/dL — ABNORMAL LOW (ref 13.0–17.0)
Lymphocytes Relative: 36 % (ref 12–46)
Lymphs Abs: 0.6 10*3/uL — ABNORMAL LOW (ref 0.7–4.0)
MCH: 35.1 pg — ABNORMAL HIGH (ref 26.0–34.0)
MCHC: 35 g/dL (ref 30.0–36.0)
MCV: 100.3 fL — ABNORMAL HIGH (ref 78.0–100.0)
Monocytes Absolute: 0.3 10*3/uL (ref 0.1–1.0)
Monocytes Relative: 16 % — ABNORMAL HIGH (ref 3–12)
Neutro Abs: 0.9 10*3/uL — ABNORMAL LOW (ref 1.7–7.7)
Neutrophils Relative %: 47 % (ref 43–77)
Platelets: 39 10*3/uL — ABNORMAL LOW (ref 150–400)
RBC: 2.96 MIL/uL — ABNORMAL LOW (ref 4.22–5.81)
RDW: 13.9 % (ref 11.5–15.5)
WBC: 1.8 10*3/uL — ABNORMAL LOW (ref 4.0–10.5)

## 2014-06-09 LAB — APTT: aPTT: 32 seconds (ref 24–37)

## 2014-06-09 LAB — PROTIME-INR
INR: 1.5 — ABNORMAL HIGH (ref 0.00–1.49)
Prothrombin Time: 18.1 seconds — ABNORMAL HIGH (ref 11.6–15.2)

## 2014-06-09 MED ORDER — METRONIDAZOLE IN NACL 5-0.79 MG/ML-% IV SOLN
500.0000 mg | Freq: Once | INTRAVENOUS | Status: AC
  Administered 2014-06-09: 500 mg via INTRAVENOUS
  Filled 2014-06-09: qty 100

## 2014-06-09 MED ORDER — NITROGLYCERIN 1 MG/10 ML FOR IR/CATH LAB
100.0000 ug | INTRA_ARTERIAL | Status: AC
  Administered 2014-06-09: 100 ug via INTRA_ARTERIAL
  Filled 2014-06-09: qty 10

## 2014-06-09 MED ORDER — NITROGLYCERIN 1 MG/10 ML FOR IR/CATH LAB
100.0000 ug | INTRA_ARTERIAL | Status: AC
  Administered 2014-06-09: 100 ug via INTRA_ARTERIAL

## 2014-06-09 MED ORDER — PANTOPRAZOLE SODIUM 40 MG IV SOLR
40.0000 mg | INTRAVENOUS | Status: DC
  Administered 2014-06-09 – 2014-06-10 (×2): 40 mg via INTRAVENOUS
  Filled 2014-06-09 (×3): qty 40

## 2014-06-09 MED ORDER — DOCUSATE SODIUM 100 MG PO CAPS
100.0000 mg | ORAL_CAPSULE | Freq: Two times a day (BID) | ORAL | Status: DC
  Administered 2014-06-10 (×2): 100 mg via ORAL
  Filled 2014-06-09 (×5): qty 1

## 2014-06-09 MED ORDER — FENTANYL CITRATE 0.05 MG/ML IJ SOLN
INTRAMUSCULAR | Status: AC | PRN
  Administered 2014-06-09 (×6): 50 ug via INTRAVENOUS

## 2014-06-09 MED ORDER — MIDAZOLAM HCL 2 MG/2ML IJ SOLN
INTRAMUSCULAR | Status: AC
  Filled 2014-06-09: qty 4

## 2014-06-09 MED ORDER — BENAZEPRIL HCL 40 MG PO TABS: 40.0000 mg | ORAL_TABLET | Freq: Every morning | ORAL | Status: DC

## 2014-06-09 MED ORDER — CIPROFLOXACIN IN D5W 400 MG/200ML IV SOLN
400.0000 mg | Freq: Once | INTRAVENOUS | Status: AC
  Administered 2014-06-09: 400 mg via INTRAVENOUS

## 2014-06-09 MED ORDER — HYDROCHLOROTHIAZIDE 25 MG PO TABS: 25.0000 mg | ORAL_TABLET | Freq: Every morning | ORAL | Status: DC

## 2014-06-09 MED ORDER — OXYCODONE HCL 5 MG PO TABS: 5.0000 mg | ORAL_TABLET | ORAL | Status: DC | PRN

## 2014-06-09 MED ORDER — FENTANYL CITRATE 0.05 MG/ML IJ SOLN
INTRAMUSCULAR | Status: AC
  Filled 2014-06-09: qty 6

## 2014-06-09 MED ORDER — MIDAZOLAM HCL 2 MG/2ML IJ SOLN
INTRAMUSCULAR | Status: AC | PRN
  Administered 2014-06-09 (×4): 1 mg via INTRAVENOUS
  Administered 2014-06-09: 0.5 mg via INTRAVENOUS
  Administered 2014-06-09 (×2): 1 mg via INTRAVENOUS
  Administered 2014-06-09: 0.5 mg via INTRAVENOUS

## 2014-06-09 MED ORDER — DEXAMETHASONE SODIUM PHOSPHATE 10 MG/ML IJ SOLN: 4.0000 mg | Freq: Once | INTRAMUSCULAR | Status: DC

## 2014-06-09 MED ORDER — ZOLPIDEM TARTRATE 10 MG PO TABS
10.0000 mg | ORAL_TABLET | Freq: Every day | ORAL | Status: DC
  Administered 2014-06-09 – 2014-06-10 (×2): 10 mg via ORAL
  Filled 2014-06-09 (×2): qty 1

## 2014-06-09 MED ORDER — HYDROMORPHONE HCL PF 1 MG/ML IJ SOLN: 0.5000 mg | Freq: Once | INTRAMUSCULAR | Status: DC

## 2014-06-09 MED ORDER — BENAZEPRIL HCL 40 MG PO TABS
40.0000 mg | ORAL_TABLET | Freq: Every morning | ORAL | Status: DC
  Administered 2014-06-10: 40 mg via ORAL
  Filled 2014-06-09 (×3): qty 1

## 2014-06-09 MED ORDER — FLUOXETINE HCL 20 MG PO CAPS: 20.0000 mg | ORAL_CAPSULE | Freq: Every morning | ORAL | Status: DC

## 2014-06-09 MED ORDER — VITAMIN B-1 100 MG PO TABS: 100.0000 mg | ORAL_TABLET | Freq: Every day | ORAL | Status: DC

## 2014-06-09 MED ORDER — NITROGLYCERIN 1 MG/10 ML FOR IR/CATH LAB
200.0000 ug | INTRA_ARTERIAL | Status: AC
  Administered 2014-06-09: 200 ug via INTRA_ARTERIAL
  Filled 2014-06-09: qty 10

## 2014-06-09 MED ORDER — FLUOXETINE HCL 20 MG PO CAPS
20.0000 mg | ORAL_CAPSULE | Freq: Every morning | ORAL | Status: DC
  Administered 2014-06-10: 20 mg via ORAL
  Filled 2014-06-09 (×3): qty 1

## 2014-06-09 MED ORDER — MIDAZOLAM HCL 2 MG/2ML IJ SOLN
INTRAMUSCULAR | Status: AC
  Filled 2014-06-09: qty 6

## 2014-06-09 MED ORDER — FENTANYL CITRATE 0.05 MG/ML IJ SOLN
INTRAMUSCULAR | Status: AC
  Filled 2014-06-09: qty 4

## 2014-06-09 MED ORDER — ALPRAZOLAM 0.5 MG PO TABS
0.5000 mg | ORAL_TABLET | Freq: Three times a day (TID) | ORAL | Status: DC
  Administered 2014-06-09 – 2014-06-10 (×4): 0.5 mg via ORAL
  Filled 2014-06-09 (×4): qty 1

## 2014-06-09 MED ORDER — HYDROCHLOROTHIAZIDE 25 MG PO TABS
25.0000 mg | ORAL_TABLET | Freq: Every morning | ORAL | Status: DC
  Administered 2014-06-10: 25 mg via ORAL
  Filled 2014-06-09 (×3): qty 1

## 2014-06-09 MED ORDER — SODIUM CHLORIDE 0.9 % IV SOLN
INTRAVENOUS | Status: DC
  Administered 2014-06-09: 10:00:00 via INTRAVENOUS

## 2014-06-09 MED ORDER — VITAMIN B-1 100 MG PO TABS
100.0000 mg | ORAL_TABLET | Freq: Every day | ORAL | Status: DC
  Administered 2014-06-10: 100 mg via ORAL
  Filled 2014-06-09 (×3): qty 1

## 2014-06-09 MED ORDER — SODIUM CHLORIDE 0.9 % IV SOLN
INTRAVENOUS | Status: DC
  Administered 2014-06-09 – 2014-06-10 (×2): via INTRAVENOUS

## 2014-06-09 MED ORDER — CIPROFLOXACIN IN D5W 400 MG/200ML IV SOLN
400.0000 mg | Freq: Two times a day (BID) | INTRAVENOUS | Status: DC
Start: 2014-06-09 — End: 2014-06-11
  Administered 2014-06-09 – 2014-06-10 (×3): 400 mg via INTRAVENOUS
  Filled 2014-06-09 (×4): qty 200

## 2014-06-09 MED ORDER — LIDOCAINE HCL 1 % IJ SOLN
INTRAMUSCULAR | Status: AC
  Filled 2014-06-09: qty 20

## 2014-06-09 MED ORDER — MAGNESIUM HYDROXIDE 400 MG/5ML PO SUSP: 30.0000 mL | Freq: Every evening | ORAL | Status: DC | PRN

## 2014-06-09 MED ORDER — CIPROFLOXACIN IN D5W 400 MG/200ML IV SOLN
INTRAVENOUS | Status: AC
  Filled 2014-06-09: qty 200

## 2014-06-09 MED ORDER — METRONIDAZOLE IN NACL 5-0.79 MG/ML-% IV SOLN
500.0000 mg | Freq: Three times a day (TID) | INTRAVENOUS | Status: DC
  Administered 2014-06-09 – 2014-06-11 (×5): 500 mg via INTRAVENOUS
  Filled 2014-06-09 (×6): qty 100

## 2014-06-09 MED ORDER — ONDANSETRON 8 MG/NS 50 ML IVPB
8.0000 mg | Freq: Three times a day (TID) | INTRAVENOUS | Status: DC | PRN
  Administered 2014-06-10: 8 mg via INTRAVENOUS
  Filled 2014-06-09 (×2): qty 8

## 2014-06-09 MED ORDER — IOHEXOL 300 MG/ML  SOLN
80.0000 mL | Freq: Once | INTRAMUSCULAR | Status: AC | PRN
  Administered 2014-06-09: 231 mL via INTRA_ARTERIAL

## 2014-06-09 MED ORDER — DIPHENHYDRAMINE HCL 50 MG/ML IJ SOLN: 25.0000 mg | Freq: Four times a day (QID) | INTRAMUSCULAR | Status: DC | PRN

## 2014-06-09 MED ORDER — DEXAMETHASONE SODIUM PHOSPHATE 4 MG/ML IJ SOLN
8.0000 mg | Freq: Once | INTRAMUSCULAR | Status: AC
  Administered 2014-06-09: 8 mg via INTRAVENOUS
  Filled 2014-06-09 (×3): qty 2

## 2014-06-09 MED ORDER — ONDANSETRON HCL 4 MG/2ML IJ SOLN
4.0000 mg | Freq: Once | INTRAMUSCULAR | Status: AC
  Administered 2014-06-09: 4 mg via INTRAVENOUS
  Filled 2014-06-09: qty 2

## 2014-06-09 MED ORDER — DEXTROSE 5 % IV SOLN
1.0000 g | Freq: Once | INTRAVENOUS | Status: DC
  Filled 2014-06-09: qty 10

## 2014-06-09 NOTE — Sedation Documentation (Signed)
Dr. Laurence Ferrari at bedside to evaluate R groin puncture site.

## 2014-06-09 NOTE — Sedation Documentation (Signed)
Dr. Laurence Ferrari states groin is stable and ok to be taken to his IP room.  Gauze/tegaderm dressing applied.

## 2014-06-09 NOTE — Anesthesia Preprocedure Evaluation (Signed)
Anesthesia Evaluation  Patient identified by MRN, date of birth, ID band Patient awake    Reviewed: Allergy & Precautions, H&P , NPO status , Patient's Chart, lab work & pertinent test results  Airway Mallampati: II TM Distance: >3 FB Neck ROM: Full    Dental  (+) Dental Advisory Given   Pulmonary neg pulmonary ROS, former smoker,  breath sounds clear to auscultation        Cardiovascular hypertension, Pt. on medications + Peripheral Vascular Disease Rhythm:Regular Rate:Normal     Neuro/Psych PSYCHIATRIC DISORDERS Anxiety Depression negative neurological ROS     GI/Hepatic negative GI ROS, Neg liver ROS, (+) Hepatitis -, C  Endo/Other  negative endocrine ROS  Renal/GU negative Renal ROS     Musculoskeletal negative musculoskeletal ROS (+)   Abdominal   Peds  Hematology  (+) anemia ,   Anesthesia Other Findings   Reproductive/Obstetrics negative OB ROS                           Anesthesia Physical Anesthesia Plan  ASA: IV  Anesthesia Plan: General   Post-op Pain Management:    Induction: Intravenous  Airway Management Planned: Oral ETT  Additional Equipment:   Intra-op Plan:   Post-operative Plan: Extubation in OR  Informed Consent: I have reviewed the patients History and Physical, chart, labs and discussed the procedure including the risks, benefits and alternatives for the proposed anesthesia with the patient or authorized representative who has indicated his/her understanding and acceptance.   Dental advisory given  Plan Discussed with: CRNA  Anesthesia Plan Comments:         Anesthesia Quick Evaluation

## 2014-06-09 NOTE — Sedation Documentation (Signed)
5Fr sheath removed from R fem artery by Dr. Laurence Ferrari.  Manual pressure applied. Gorin level 0, DPP +doppler.

## 2014-06-09 NOTE — H&P (Signed)
Chief Complaint: "I'm here for my liver treatment Referring Physician:Magrinat HPI: Jeffrey Snow is an 58 y.o. male with history of HCV and EtOH who is found to have a liver lesion c/w HCC. He was seen in consult by Dr. Laurence Ferrari, please see IR Rad eval under Imaging section for full details. He treatment was decided and he is scheduled for TACE(Transarterial ChemoEmbolization) of the liver lesion today. He will then be admitted for observation and if no complications, will then have subsequent percutaneous microwave ablation of the tumor tomorrow under CT guidance. PMHx, meds, imaging, labs reviewed. Pt with hx of thrombocytopenia, PLTs were ordered for transfusion. Pt feels ok, in fact, states he has felt much better the past few weeks since he quit drinking EtOH.    Past Medical History:  Past Medical History  Diagnosis Date  . Depression   . Anxiety   . Panic attack   . Hypertension   . Hepatitis C   . Claudication of left lower extremity m-1  . Abnormal MRI of abdomen, liver  04/21/2014  . Cirrhosis, alcoholic 08/17/1193    Past Surgical History:  Past Surgical History  Procedure Laterality Date  . Cosmetic surgery      facial reconstructive surgery  . Aortogram      Family History:  Family History  Problem Relation Age of Onset  . Diabetes Father   . Diabetes Brother   . Diabetes Brother     Social History:  reports that he quit smoking about 2 years ago. He has never used smokeless tobacco. He reports that he drinks about 3.6 ounces of alcohol per week. He reports that he does not use illicit drugs.  Allergies: No Known Allergies  Medications: Xanax 0.63m tid prn, Lotensin 459mdaily, Prozac 2066maily, Folvite 1mg18mily, HCTZ 25mg51mly, Thiamine 100mg 69my, Ambien 5mg qh73mPlease HPI for pertinent positives, otherwise complete 10 system ROS negative.  Physical Exam: BP 125/64  Pulse 49  Temp(Src) 97.8 F (36.6 C) (Oral)  Resp 15  SpO2 99% There is no  weight on file to calculate BMI.   General Appearance:  Alert, cooperative, no distress, appears stated age  Head:  Normocephalic, without obvious abnormality, atraumatic  ENT: Unremarkable  Neck: Supple, symmetrical, trachea midline  Lungs:   Clear to auscultation bilaterally, no w/r/r, respirations unlabored without use of accessory muscles.  Heart:  Regular rate and rhythm, S1, S2 normal, no murmur, rub or gallop.  Abdomen:   Soft, non-tender, non distended.  Extremities: Extremities normal, atraumatic 1+ edema (B)  Pulses: 2+ and symmetric femoral   Neurologic: Normal affect, no gross deficits.   Results for orders placed during the hospital encounter of 06/09/14 (from the past 48 hour(s))  PREPARE PLATELET PHERESIS     Status: None   Collection Time    06/09/14  9:00 AM      Result Value Ref Range   Unit Number [id number] Component Type PLTPHER LR3     Unit division 00     Status of Unit ISSUED     Transfusion Status OK TO TRANSFUSE    APTT     Status: None   Collection Time    06/09/14  9:18 AM      Result Value Ref Range   aPTT 32  24 - 37 seconds  COMPREHENSIVE METABOLIC PANEL     Status: Abnormal   Collection Time    06/09/14  9:18 AM  Result Value Ref Range   Sodium 136 (*) 137 - 147 mEq/L   Potassium 3.9  3.7 - 5.3 mEq/L   Chloride 101  96 - 112 mEq/L   CO2 22  19 - 32 mEq/L   Glucose, Bld 92  70 - 99 mg/dL   BUN 7  6 - 23 mg/dL   Creatinine, Ser 0.80  0.50 - 1.35 mg/dL   Calcium 8.5  8.4 - 10.5 mg/dL   Total Protein 5.8 (*) 6.0 - 8.3 g/dL   Albumin 2.8 (*) 3.5 - 5.2 g/dL   AST 74 (*) 0 - 37 U/L   Comment: SLIGHT HEMOLYSIS     HEMOLYSIS AT THIS LEVEL MAY AFFECT RESULT   ALT 43  0 - 53 U/L   Alkaline Phosphatase 96  39 - 117 U/L   Total Bilirubin 1.9 (*) 0.3 - 1.2 mg/dL   GFR calc non Af Amer >90  >90 mL/min   GFR calc Af Amer >90  >90 mL/min   Comment: (NOTE)     The eGFR has been calculated using the CKD EPI equation.     This  calculation has not been validated in all clinical situations.     eGFR's persistently <90 mL/min signify possible Chronic Kidney     Disease.  PROTIME-INR     Status: Abnormal   Collection Time    06/09/14  9:18 AM      Result Value Ref Range   Prothrombin Time 18.1 (*) 11.6 - 15.2 seconds   INR 1.50 (*) 0.00 - 1.49   No results found.  Assessment/Plan Hepatic tumor c/w HCC For TACE today followed by CT guided perc microwave ablation tomorrow. Discussed both procedures in detail. Explained risks, complications, plan for admission. Will give 1 unit PLTs prior to procedure today. Labs reviewed. Consent signed in chart  Ascencion Dike PA-C 06/09/2014, 11:54 AM

## 2014-06-09 NOTE — Procedures (Signed)
Interventional Radiology Procedure Note  Procedure: Drug eluting bead transarterial chemoembolization Complications: None  Recommendations: - Bedrest x 4 hrs - Clear diet, NPO after midnight - IV cipro and flagyl tonight - MWA tomorrow  Signed,  Criselda Peaches, MD [facility]

## 2014-06-09 NOTE — Sedation Documentation (Signed)
Jeffrey Snow, RT, while holding pressure over R fem art puncture site, noticed  appox 2-3cm soft hematoma. Dr. Laurence Ferrari notified.  Manual pressure continues to hold pressure.

## 2014-06-09 NOTE — Progress Notes (Signed)
Report received from [facility] in Interventional Radiology.  Awaiting patient's arrival.  Dirk Dress 06/09/2014 2:28 PM

## 2014-06-10 ENCOUNTER — Ambulatory Visit (HOSPITAL_COMMUNITY)
Admission: RE | Admit: 2014-06-10 | Payer: Medicaid Other | Source: Ambulatory Visit | Admitting: Interventional Radiology

## 2014-06-10 ENCOUNTER — Inpatient Hospital Stay (HOSPITAL_COMMUNITY): Payer: Medicaid Other | Admitting: Anesthesiology

## 2014-06-10 ENCOUNTER — Inpatient Hospital Stay (HOSPITAL_COMMUNITY): Payer: Medicaid Other

## 2014-06-10 ENCOUNTER — Ambulatory Visit (HOSPITAL_COMMUNITY)
Admission: RE | Admit: 2014-06-10 | Discharge: 2014-06-10 | Disposition: A | Discharge status: Home / Self Care | Payer: Medicaid Other | Source: Ambulatory Visit | Attending: Interventional Radiology | Admitting: Interventional Radiology

## 2014-06-10 ENCOUNTER — Encounter (HOSPITAL_COMMUNITY): Payer: Medicaid Other | Admitting: Anesthesiology

## 2014-06-10 ENCOUNTER — Encounter (HOSPITAL_COMMUNITY)
Admission: RE | Disposition: A | Discharge status: Home / Self Care | Payer: Self-pay | Source: Ambulatory Visit | Attending: Interventional Radiology

## 2014-06-10 ENCOUNTER — Encounter (HOSPITAL_COMMUNITY): Payer: Self-pay

## 2014-06-10 DIAGNOSIS — C220 Liver cell carcinoma: Secondary | ICD-10-CM

## 2014-06-10 DIAGNOSIS — C228 Malignant neoplasm of liver, primary, unspecified as to type: Secondary | ICD-10-CM | POA: Diagnosis not present

## 2014-06-10 LAB — PREPARE PLATELET PHERESIS: Unit division: 0

## 2014-06-10 LAB — CBC
HCT: 26 % — ABNORMAL LOW (ref 39.0–52.0)
Hemoglobin: 8.8 g/dL — ABNORMAL LOW (ref 13.0–17.0)
MCH: 34.2 pg — ABNORMAL HIGH (ref 26.0–34.0)
MCHC: 33.8 g/dL (ref 30.0–36.0)
MCV: 101.2 fL — ABNORMAL HIGH (ref 78.0–100.0)
Platelets: 43 10*3/uL — ABNORMAL LOW (ref 150–400)
RBC: 2.57 MIL/uL — ABNORMAL LOW (ref 4.22–5.81)
RDW: 13.7 % (ref 11.5–15.5)
WBC: 4.2 10*3/uL (ref 4.0–10.5)

## 2014-06-10 LAB — COMPREHENSIVE METABOLIC PANEL
ALT: 32 U/L (ref 0–53)
AST: 45 U/L — ABNORMAL HIGH (ref 0–37)
Albumin: 2.3 g/dL — ABNORMAL LOW (ref 3.5–5.2)
Alkaline Phosphatase: 77 U/L (ref 39–117)
BUN: 9 mg/dL (ref 6–23)
CO2: 21 mEq/L (ref 19–32)
Calcium: 8.2 mg/dL — ABNORMAL LOW (ref 8.4–10.5)
Chloride: 105 mEq/L (ref 96–112)
Creatinine, Ser: 0.81 mg/dL (ref 0.50–1.35)
GFR calc Af Amer: >90 mL/min (ref >90)
GFR calc non Af Amer: >90 mL/min (ref >90)
Glucose, Bld: 136 mg/dL — ABNORMAL HIGH (ref 70–99)
Potassium: 3.2 mEq/L — ABNORMAL LOW (ref 3.7–5.3)
Sodium: 137 mEq/L (ref 137–147)
Total Bilirubin: 1.2 mg/dL (ref 0.3–1.2)
Total Protein: 5.2 g/dL — ABNORMAL LOW (ref 6.0–8.3)

## 2014-06-10 SURGERY — RADIO FREQUENCY ABLATION
Anesthesia: General

## 2014-06-10 MED ORDER — FENTANYL CITRATE 0.05 MG/ML IJ SOLN
INTRAMUSCULAR | Status: AC
Start: 2014-06-10 — End: 2014-06-10
  Filled 2014-06-10: qty 5

## 2014-06-10 MED ORDER — ONDANSETRON HCL 4 MG/2ML IJ SOLN
INTRAMUSCULAR | Status: AC
  Filled 2014-06-10: qty 2

## 2014-06-10 MED ORDER — GLYCOPYRROLATE 0.2 MG/ML IJ SOLN
INTRAMUSCULAR | Status: DC | PRN
  Administered 2014-06-10: .2 mg via INTRAVENOUS

## 2014-06-10 MED ORDER — SODIUM CHLORIDE 0.9 % IJ SOLN
INTRAMUSCULAR | Status: AC
  Filled 2014-06-10: qty 10

## 2014-06-10 MED ORDER — SODIUM CHLORIDE 0.9 % IV SOLN
INTRAVENOUS | Status: DC
  Administered 2014-06-10 (×2): via INTRAVENOUS

## 2014-06-10 MED ORDER — PROPOFOL 10 MG/ML IV BOLUS
INTRAVENOUS | Status: AC
  Filled 2014-06-10: qty 20

## 2014-06-10 MED ORDER — OXYCODONE HCL 5 MG/5ML PO SOLN: 5.0000 mg | Freq: Once | ORAL | Status: AC | PRN

## 2014-06-10 MED ORDER — MIDAZOLAM HCL 5 MG/5ML IJ SOLN
INTRAMUSCULAR | Status: DC | PRN
  Administered 2014-06-10: 1 mg via INTRAVENOUS

## 2014-06-10 MED ORDER — HYDROMORPHONE HCL PF 1 MG/ML IJ SOLN: 0.2500 mg | INTRAMUSCULAR | Status: DC | PRN

## 2014-06-10 MED ORDER — PHENYLEPHRINE HCL 10 MG/ML IJ SOLN
INTRAMUSCULAR | Status: DC | PRN
  Administered 2014-06-10 (×3): 40 ug via INTRAVENOUS
  Administered 2014-06-10: 10 ug via INTRAVENOUS
  Administered 2014-06-10: 40 ug via INTRAVENOUS
  Administered 2014-06-10: 20 ug via INTRAVENOUS
  Administered 2014-06-10: 40 ug via INTRAVENOUS

## 2014-06-10 MED ORDER — MIDAZOLAM HCL 2 MG/2ML IJ SOLN
INTRAMUSCULAR | Status: AC
  Filled 2014-06-10: qty 2

## 2014-06-10 MED ORDER — LIDOCAINE HCL (CARDIAC) 20 MG/ML IV SOLN
INTRAVENOUS | Status: DC | PRN
Start: 2014-06-10 — End: 2014-06-10
  Administered 2014-06-10: 75 mg via INTRAVENOUS

## 2014-06-10 MED ORDER — SUCCINYLCHOLINE CHLORIDE 20 MG/ML IJ SOLN
INTRAMUSCULAR | Status: DC | PRN
  Administered 2014-06-10: 100 mg via INTRAVENOUS

## 2014-06-10 MED ORDER — LACTATED RINGERS IV SOLN
INTRAVENOUS | Status: DC | PRN
  Administered 2014-06-10 (×2): via INTRAVENOUS

## 2014-06-10 MED ORDER — ONDANSETRON HCL 4 MG/2ML IJ SOLN
INTRAMUSCULAR | Status: DC | PRN
  Administered 2014-06-10: 4 mg via INTRAVENOUS

## 2014-06-10 MED ORDER — EPHEDRINE SULFATE 50 MG/ML IJ SOLN
INTRAMUSCULAR | Status: DC | PRN
  Administered 2014-06-10: 5 mg via INTRAVENOUS

## 2014-06-10 MED ORDER — ATROPINE SULFATE 0.4 MG/ML IJ SOLN
INTRAMUSCULAR | Status: AC
  Filled 2014-06-10: qty 1

## 2014-06-10 MED ORDER — OXYCODONE HCL 5 MG PO TABS: 5.0000 mg | ORAL_TABLET | Freq: Once | ORAL | Status: AC | PRN

## 2014-06-10 MED ORDER — CISATRACURIUM BESYLATE (PF) 10 MG/5ML IV SOLN
INTRAVENOUS | Status: DC | PRN
  Administered 2014-06-10: 8 mg via INTRAVENOUS

## 2014-06-10 MED ORDER — LACTATED RINGERS IV SOLN
INTRAVENOUS | Status: DC | PRN
  Administered 2014-06-10: 07:00:00 via INTRAVENOUS

## 2014-06-10 MED ORDER — FENTANYL CITRATE 0.05 MG/ML IJ SOLN
INTRAMUSCULAR | Status: DC | PRN
  Administered 2014-06-10: 100 ug via INTRAVENOUS
  Administered 2014-06-10: 50 ug via INTRAVENOUS
  Administered 2014-06-10: 100 ug via INTRAVENOUS

## 2014-06-10 MED ORDER — CISATRACURIUM BESYLATE 20 MG/10ML IV SOLN
INTRAVENOUS | Status: AC
  Filled 2014-06-10: qty 20

## 2014-06-10 MED ORDER — ALUM & MAG HYDROXIDE-SIMETH 200-200-20 MG/5ML PO SUSP
30.0000 mL | Freq: Four times a day (QID) | ORAL | Status: DC | PRN
  Administered 2014-06-10: 30 mL via ORAL
  Filled 2014-06-10: qty 30

## 2014-06-10 MED ORDER — MEPERIDINE HCL 50 MG/ML IJ SOLN: 6.2500 mg | INTRAMUSCULAR | Status: DC | PRN

## 2014-06-10 MED ORDER — GLYCOPYRROLATE 0.2 MG/ML IJ SOLN
INTRAMUSCULAR | Status: AC
  Filled 2014-06-10: qty 3

## 2014-06-10 MED ORDER — LIDOCAINE HCL (CARDIAC) 20 MG/ML IV SOLN
INTRAVENOUS | Status: AC
  Filled 2014-06-10: qty 5

## 2014-06-10 MED ORDER — IOHEXOL 300 MG/ML  SOLN
100.0000 mL | Freq: Once | INTRAMUSCULAR | Status: AC | PRN
  Administered 2014-06-10: 100 mL via INTRAVENOUS

## 2014-06-10 MED ORDER — NEOSTIGMINE METHYLSULFATE 10 MG/10ML IV SOLN
INTRAVENOUS | Status: AC
  Filled 2014-06-10: qty 1

## 2014-06-10 MED ORDER — EPHEDRINE SULFATE 50 MG/ML IJ SOLN
INTRAMUSCULAR | Status: AC
  Filled 2014-06-10: qty 1

## 2014-06-10 MED ORDER — PROPOFOL 10 MG/ML IV BOLUS
INTRAVENOUS | Status: DC | PRN
  Administered 2014-06-10: 100 mg via INTRAVENOUS

## 2014-06-10 MED ORDER — PROMETHAZINE HCL 25 MG/ML IJ SOLN: 6.2500 mg | INTRAMUSCULAR | Status: DC | PRN

## 2014-06-10 MED ORDER — FENTANYL CITRATE 0.05 MG/ML IJ SOLN
INTRAMUSCULAR | Status: AC
  Filled 2014-06-10: qty 5

## 2014-06-10 MED ORDER — DEXAMETHASONE SODIUM PHOSPHATE 10 MG/ML IJ SOLN
INTRAMUSCULAR | Status: AC
Start: 2014-06-10 — End: 2014-06-10
  Filled 2014-06-10: qty 1

## 2014-06-10 MED ORDER — NEOSTIGMINE METHYLSULFATE 10 MG/10ML IV SOLN
INTRAVENOUS | Status: DC | PRN
  Administered 2014-06-10: 2 mg via INTRAVENOUS

## 2014-06-10 NOTE — Anesthesia Procedure Notes (Signed)
Procedure Name: Intubation Date/Time: 06/10/2014 7:50 AM Performed by: Ofilia Neas Pre-anesthesia Checklist: Patient identified, Patient being monitored, Timeout performed, Emergency Drugs available and Suction available Patient Re-evaluated:Patient Re-evaluated prior to inductionOxygen Delivery Method: Circle system utilized Preoxygenation: Pre-oxygenation with 100% oxygen Intubation Type: IV induction Ventilation: Mask ventilation without difficulty Laryngoscope Size: Mac and 4 Grade View: Grade II Tube type: Oral Tube size: 7.5 mm Number of attempts: 1 Airway Equipment and Method: Stylet Placement Confirmation: ETT inserted through vocal cords under direct vision,  positive ETCO2 and breath sounds checked- equal and bilateral Secured at: 21 cm Tube secured with: Tape Dental Injury: Teeth and Oropharynx as per pre-operative assessment

## 2014-06-10 NOTE — Progress Notes (Signed)
Subjective: Patient is s/p thermal ablation of hepatocellular carcinoma today. S/p drug eluting transarterial chemoembolization of [place] on 6/25. He denies any pain, N/V. He does admit to some abdominal "gas". He denies any bleeding and states he is eager to get home.   Objective: Physical Exam: BP 115/56  Pulse 80  Temp(Src) 97.3 F (36.3 C) (Oral)  Resp 16  Ht 5\' 8"  (1.727 m)  Wt 145 lb (65.772 kg)  BMI 22.05 kg/m2  SpO2 99%  General: A&Ox3, NAD, lying in bed Heart: RRR without M/G/R Lungs: CTA bilaterally Abd: Soft, NT, distended, (+) hypo BS, access site without bleeding.  Ext: without edema  Labs: CBC  Recent Labs  06/09/14 0931 06/10/14 0415  WBC 1.8* 4.2  HGB 10.4* 8.8*  HCT 29.7* 26.0*  PLT 39* 43*   BMET  Recent Labs  06/09/14 0918 06/10/14 0415  NA 136* 137  K 3.9 3.2*  CL 101 105  CO2 22 21  GLUCOSE 92 136*  BUN 7 9  CREATININE 0.80 0.81  CALCIUM 8.5 8.2*   LFT  Recent Labs  06/10/14 0415  PROT 5.2*  ALBUMIN 2.3*  AST 45*  ALT 32  ALKPHOS 77  BILITOT 1.2   PT/INR  Recent Labs  06/09/14 0918  LABPROT 18.1*  INR 1.50*     Studies/Results: Ir Angiogram Visceral Selective  06/10/2014   CLINICAL DATA:  58 year old male with HCV and ETOH cirrhosis and newly diagnosed 3.2 cm hepatocellular carcinoma. He presents for the first stage of a combined liver directed therapy. He will undergo super selective DEB-TACE to be followed by percutaneous microwave embolization tomorrow.  EXAM: IR EMBO TUMOR ORGAN ISCHEMIA INFARCT INC GUIDE ROADMAPPING; ADDITIONAL ARTERIOGRAPHY; IR ULTRASOUND GUIDANCE VASC ACCESS RIGHT; SELECTIVE VISCERAL ARTERIOGRAPHY  Date: 06/10/2014  PROCEDURE: 1. Ultrasound-guided puncture of the right common femoral artery 2. Catheterization of the celiac artery with arteriogram 3. Catheterization of the common hepatic artery with arteriogram 4. Catheterization of the right hepatic artery with arteriogram 5. Catheterization of the A5  artery with arteriogram 6. Catheterization of the joint trunk of the dorsal A8, A7 and A6 right hepatic arteries with arteriogram 7. Catheterization of the A7 artery with arteriogram 8. Catheterization of the A8 artery with arteriogram 9. Chemo embolization of the A7 artery Interventional Radiologist: Criselda Peaches Next item post embolization arteriogram, MD  ANESTHESIA/SEDATION: Moderate (conscious) sedation was used. Seven mg Versed, 300 mcg Fentanyl were administered intravenously. The patient's vital signs were monitored continuously by radiology nursing throughout the procedure.  Sedation Time: 150 minutes preoperative medications included ciprofloxacin 400 mg IV and Flagyl 500 mg IV prophylactically within 1 hr of skin incision. Additionally, 8 mg Decadron and 8 mg Zofran were administered intravenously in the preprocedural setting.  FLUOROSCOPY TIME:  30 minutes 12 seconds  CONTRAST:  231 mL OMNIPAQUE IOHEXOL 300 MG/ML  SOLN  TECHNIQUE: Informed consent was obtained from the patient following explanation of the procedure, risks, benefits and alternatives. The patient understands, agrees and consents for the procedure. All questions were addressed. A time out was performed.  Maximal barrier sterile technique utilized including caps, mask, sterile gowns, sterile gloves, large sterile drape, hand hygiene, and Betadine skin prep.  The right groin was interrogated with ultrasound. The right common femoral artery was identified. There is heavy atherosclerotic plaque in the common femoral artery. A relatively plaque free portion of artery was identified. Image was obtained and stored. Local anesthesia was attained by infiltration with 1% lidocaine. Under real-time sonographic guidance, the  common femoral artery was punctured with a 21 gauge micropuncture needle. Image was obtained and stored. With the aid of a transitional 5 French micro sheath, a Bentson wire was advanced into the abdominal aorta. The micro  sheath was then exchanged for a working 5 [place] vascular sheath. A C2 Cobra catheter was advanced in the abdominal aorta and used to select the celiac artery. Celiac arteriogram was performed. No evidence of replaced right hepatic artery. Tumor blush is present in the mid right hepatic artery consistent with the known hepatocellular carcinoma.  The cobra catheter was advanced into the common hepatic artery in a common hepatic arteriogram was performed the a for hepatic arteries arise from the left hepatic artery. There appears to be an abnormal branching pattern of the right hepatic artery. Tumor blush is again identified in the mid right hepatic lobe. To better evaluate the right hepatic arterial anatomy, a micro catheter was advanced into the right hepatic artery in arteriography was performed in multiple obliquities. There is a proximal common trunk supplying A5 artery. Distal to this is a common trunk supplying the dorsal A8, A7 and A6 branches. The micro catheter was first advanced into the common trunk of the A5 and ventral 8 arteries. An arteriogram was performed. There is no definite tumor blush.  The micro catheter was next advanced into the right hepatic artery into common trunk of the A8 and A6 arteries. Again, no definite tumor blush is identified. The micro catheter was pulled back into the right  Common trunk supplying the A7, A8 and A6 arteries an arteriogram was performed. The tumor blush was again identified. Therefore, it is likely arising from the A7 artery. The micro catheter was advanced into the 87 artery and an arteriogram was performed. Tumor blush is not definitively identified on the initial arteriogram. Therefore, the micro catheter was again pulled back into the common trunk an arteriogram was performed demonstrating the tumor blush. The micro catheter therefore advanced into the a 8 artery and an arteriogram was performed. The tumor is outlined as a negative on the arteriogram  suggesting the A8 artery flows around the tumor.  Therefore, the micro catheter was readvanced in the A7 artery. Chemo embolization was then performed with 75 mg of doxorubicin labeled to 1 vial of 100- 300 micron LC beads. Approximately 70% of the dose was administered be for flow began this low. No further administration of the therapeutic agent was performed at that time. The micro catheter was removed. A post  The catheter was removed. A limited right common femoral arteriogram was performed confirming common femoral arterial puncture. Embolization arteriogram was performed through the 5 French catheter. There is persistent tumor blush as expected. Secondary to the atherosclerotic disease in the right groin, hemostasis was attained by manual pressure following removal of the sheath. Overall, the patient tolerated the procedure very well.  COMPLICATIONS: None immediate  IMPRESSION: Successful chemoembolization using drug-eluting beads. Approximately 70% of a dose of 75 mg of doxorubicin labeled to 100- 300 micron LC beads was administered.  Signed,  Criselda Peaches, MD  [facility]  [facility]   Electronically Signed   By: Jacqulynn Cadet M.D.   On: 06/10/2014 08:42   Ir Angiogram Selective Each Additional Vessel  06/10/2014   CLINICAL DATA:  58 year old male with HCV and ETOH cirrhosis and newly diagnosed 3.2 cm hepatocellular carcinoma. He presents for the first stage of a combined liver directed therapy. He will undergo super selective DEB-TACE  to be followed by percutaneous microwave embolization tomorrow.  EXAM: IR EMBO TUMOR ORGAN ISCHEMIA INFARCT INC GUIDE ROADMAPPING; ADDITIONAL ARTERIOGRAPHY; IR ULTRASOUND GUIDANCE VASC ACCESS RIGHT; SELECTIVE VISCERAL ARTERIOGRAPHY  Date: 06/10/2014  PROCEDURE: 1. Ultrasound-guided puncture of the right common femoral artery 2. Catheterization of the celiac artery with arteriogram 3. Catheterization of the common  hepatic artery with arteriogram 4. Catheterization of the right hepatic artery with arteriogram 5. Catheterization of the A5 artery with arteriogram 6. Catheterization of the joint trunk of the dorsal A8, A7 and A6 right hepatic arteries with arteriogram 7. Catheterization of the A7 artery with arteriogram 8. Catheterization of the A8 artery with arteriogram 9. Chemo embolization of the A7 artery Interventional Radiologist: Criselda Peaches Next item post embolization arteriogram, MD  ANESTHESIA/SEDATION: Moderate (conscious) sedation was used. Seven mg Versed, 300 mcg Fentanyl were administered intravenously. The patient's vital signs were monitored continuously by radiology nursing throughout the procedure.  Sedation Time: 150 minutes preoperative medications included ciprofloxacin 400 mg IV and Flagyl 500 mg IV prophylactically within 1 hr of skin incision. Additionally, 8 mg Decadron and 8 mg Zofran were administered intravenously in the preprocedural setting.  FLUOROSCOPY TIME:  30 minutes 12 seconds  CONTRAST:  231 mL OMNIPAQUE IOHEXOL 300 MG/ML  SOLN  TECHNIQUE: Informed consent was obtained from the patient following explanation of the procedure, risks, benefits and alternatives. The patient understands, agrees and consents for the procedure. All questions were addressed. A time out was performed.  Maximal barrier sterile technique utilized including caps, mask, sterile gowns, sterile gloves, large sterile drape, hand hygiene, and Betadine skin prep.  The right groin was interrogated with ultrasound. The right common femoral artery was identified. There is heavy atherosclerotic plaque in the common femoral artery. A relatively plaque free portion of artery was identified. Image was obtained and stored. Local anesthesia was attained by infiltration with 1% lidocaine. Under real-time sonographic guidance, the common femoral artery was punctured with a 21 gauge micropuncture needle. Image was obtained and  stored. With the aid of a transitional 5 French micro sheath, a Bentson wire was advanced into the abdominal aorta. The micro sheath was then exchanged for a working 5 [place] vascular sheath. A C2 Cobra catheter was advanced in the abdominal aorta and used to select the celiac artery. Celiac arteriogram was performed. No evidence of replaced right hepatic artery. Tumor blush is present in the mid right hepatic artery consistent with the known hepatocellular carcinoma.  The cobra catheter was advanced into the common hepatic artery in a common hepatic arteriogram was performed the a for hepatic arteries arise from the left hepatic artery. There appears to be an abnormal branching pattern of the right hepatic artery. Tumor blush is again identified in the mid right hepatic lobe. To better evaluate the right hepatic arterial anatomy, a micro catheter was advanced into the right hepatic artery in arteriography was performed in multiple obliquities. There is a proximal common trunk supplying A5 artery. Distal to this is a common trunk supplying the dorsal A8, A7 and A6 branches. The micro catheter was first advanced into the common trunk of the A5 and ventral 8 arteries. An arteriogram was performed. There is no definite tumor blush.  The micro catheter was next advanced into the right hepatic artery into common trunk of the A8 and A6 arteries. Again, no definite tumor blush is identified. The micro catheter was pulled back into the right  Common trunk supplying the A7, A8 and A6 arteries an arteriogram  was performed. The tumor blush was again identified. Therefore, it is likely arising from the A7 artery. The micro catheter was advanced into the 87 artery and an arteriogram was performed. Tumor blush is not definitively identified on the initial arteriogram. Therefore, the micro catheter was again pulled back into the common trunk an arteriogram was performed demonstrating the tumor blush. The micro catheter therefore  advanced into the a 8 artery and an arteriogram was performed. The tumor is outlined as a negative on the arteriogram suggesting the A8 artery flows around the tumor.  Therefore, the micro catheter was readvanced in the A7 artery. Chemo embolization was then performed with 75 mg of doxorubicin labeled to 1 vial of 100- 300 micron LC beads. Approximately 70% of the dose was administered be for flow began this low. No further administration of the therapeutic agent was performed at that time. The micro catheter was removed. A post  The catheter was removed. A limited right common femoral arteriogram was performed confirming common femoral arterial puncture. Embolization arteriogram was performed through the 5 French catheter. There is persistent tumor blush as expected. Secondary to the atherosclerotic disease in the right groin, hemostasis was attained by manual pressure following removal of the sheath. Overall, the patient tolerated the procedure very well.  COMPLICATIONS: None immediate  IMPRESSION: Successful chemoembolization using drug-eluting beads. Approximately 70% of a dose of 75 mg of doxorubicin labeled to 100- 300 micron LC beads was administered.  Signed,  Criselda Peaches, MD  [facility]  [facility]   Electronically Signed   By: Jacqulynn Cadet M.D.   On: 06/10/2014 08:42   Ir Angiogram Selective Each Additional Vessel  06/10/2014   CLINICAL DATA:  58 year old male with HCV and ETOH cirrhosis and newly diagnosed 3.2 cm hepatocellular carcinoma. He presents for the first stage of a combined liver directed therapy. He will undergo super selective DEB-TACE to be followed by percutaneous microwave embolization tomorrow.  EXAM: IR EMBO TUMOR ORGAN ISCHEMIA INFARCT INC GUIDE ROADMAPPING; ADDITIONAL ARTERIOGRAPHY; IR ULTRASOUND GUIDANCE VASC ACCESS RIGHT; SELECTIVE VISCERAL ARTERIOGRAPHY  Date: 06/10/2014  PROCEDURE: 1. Ultrasound-guided puncture of the  right common femoral artery 2. Catheterization of the celiac artery with arteriogram 3. Catheterization of the common hepatic artery with arteriogram 4. Catheterization of the right hepatic artery with arteriogram 5. Catheterization of the A5 artery with arteriogram 6. Catheterization of the joint trunk of the dorsal A8, A7 and A6 right hepatic arteries with arteriogram 7. Catheterization of the A7 artery with arteriogram 8. Catheterization of the A8 artery with arteriogram 9. Chemo embolization of the A7 artery Interventional Radiologist: Criselda Peaches Next item post embolization arteriogram, MD  ANESTHESIA/SEDATION: Moderate (conscious) sedation was used. Seven mg Versed, 300 mcg Fentanyl were administered intravenously. The patient's vital signs were monitored continuously by radiology nursing throughout the procedure.  Sedation Time: 150 minutes preoperative medications included ciprofloxacin 400 mg IV and Flagyl 500 mg IV prophylactically within 1 hr of skin incision. Additionally, 8 mg Decadron and 8 mg Zofran were administered intravenously in the preprocedural setting.  FLUOROSCOPY TIME:  30 minutes 12 seconds  CONTRAST:  231 mL OMNIPAQUE IOHEXOL 300 MG/ML  SOLN  TECHNIQUE: Informed consent was obtained from the patient following explanation of the procedure, risks, benefits and alternatives. The patient understands, agrees and consents for the procedure. All questions were addressed. A time out was performed.  Maximal barrier sterile technique utilized including caps, mask, sterile gowns, sterile gloves, large sterile drape, hand  hygiene, and Betadine skin prep.  The right groin was interrogated with ultrasound. The right common femoral artery was identified. There is heavy atherosclerotic plaque in the common femoral artery. A relatively plaque free portion of artery was identified. Image was obtained and stored. Local anesthesia was attained by infiltration with 1% lidocaine. Under real-time  sonographic guidance, the common femoral artery was punctured with a 21 gauge micropuncture needle. Image was obtained and stored. With the aid of a transitional 5 French micro sheath, a Bentson wire was advanced into the abdominal aorta. The micro sheath was then exchanged for a working 5 [place] vascular sheath. A C2 Cobra catheter was advanced in the abdominal aorta and used to select the celiac artery. Celiac arteriogram was performed. No evidence of replaced right hepatic artery. Tumor blush is present in the mid right hepatic artery consistent with the known hepatocellular carcinoma.  The cobra catheter was advanced into the common hepatic artery in a common hepatic arteriogram was performed the a for hepatic arteries arise from the left hepatic artery. There appears to be an abnormal branching pattern of the right hepatic artery. Tumor blush is again identified in the mid right hepatic lobe. To better evaluate the right hepatic arterial anatomy, a micro catheter was advanced into the right hepatic artery in arteriography was performed in multiple obliquities. There is a proximal common trunk supplying A5 artery. Distal to this is a common trunk supplying the dorsal A8, A7 and A6 branches. The micro catheter was first advanced into the common trunk of the A5 and ventral 8 arteries. An arteriogram was performed. There is no definite tumor blush.  The micro catheter was next advanced into the right hepatic artery into common trunk of the A8 and A6 arteries. Again, no definite tumor blush is identified. The micro catheter was pulled back into the right  Common trunk supplying the A7, A8 and A6 arteries an arteriogram was performed. The tumor blush was again identified. Therefore, it is likely arising from the A7 artery. The micro catheter was advanced into the 87 artery and an arteriogram was performed. Tumor blush is not definitively identified on the initial arteriogram. Therefore, the micro catheter was again  pulled back into the common trunk an arteriogram was performed demonstrating the tumor blush. The micro catheter therefore advanced into the a 8 artery and an arteriogram was performed. The tumor is outlined as a negative on the arteriogram suggesting the A8 artery flows around the tumor.  Therefore, the micro catheter was readvanced in the A7 artery. Chemo embolization was then performed with 75 mg of doxorubicin labeled to 1 vial of 100- 300 micron LC beads. Approximately 70% of the dose was administered be for flow began this low. No further administration of the therapeutic agent was performed at that time. The micro catheter was removed. A post  The catheter was removed. A limited right common femoral arteriogram was performed confirming common femoral arterial puncture. Embolization arteriogram was performed through the 5 French catheter. There is persistent tumor blush as expected. Secondary to the atherosclerotic disease in the right groin, hemostasis was attained by manual pressure following removal of the sheath. Overall, the patient tolerated the procedure very well.  COMPLICATIONS: None immediate  IMPRESSION: Successful chemoembolization using drug-eluting beads. Approximately 70% of a dose of 75 mg of doxorubicin labeled to 100- 300 micron LC beads was administered.  Signed,  Criselda Peaches, MD  [facility]  [facility]   Electronically Signed  By: Jacqulynn Cadet M.D.   On: 06/10/2014 08:42   Ir Angiogram Selective Each Additional Vessel  06/10/2014   CLINICAL DATA:  58 year old male with HCV and ETOH cirrhosis and newly diagnosed 3.2 cm hepatocellular carcinoma. He presents for the first stage of a combined liver directed therapy. He will undergo super selective DEB-TACE to be followed by percutaneous microwave embolization tomorrow.  EXAM: IR EMBO TUMOR ORGAN ISCHEMIA INFARCT INC GUIDE ROADMAPPING; ADDITIONAL ARTERIOGRAPHY; IR ULTRASOUND  GUIDANCE VASC ACCESS RIGHT; SELECTIVE VISCERAL ARTERIOGRAPHY  Date: 06/10/2014  PROCEDURE: 1. Ultrasound-guided puncture of the right common femoral artery 2. Catheterization of the celiac artery with arteriogram 3. Catheterization of the common hepatic artery with arteriogram 4. Catheterization of the right hepatic artery with arteriogram 5. Catheterization of the A5 artery with arteriogram 6. Catheterization of the joint trunk of the dorsal A8, A7 and A6 right hepatic arteries with arteriogram 7. Catheterization of the A7 artery with arteriogram 8. Catheterization of the A8 artery with arteriogram 9. Chemo embolization of the A7 artery Interventional Radiologist: Criselda Peaches Next item post embolization arteriogram, MD  ANESTHESIA/SEDATION: Moderate (conscious) sedation was used. Seven mg Versed, 300 mcg Fentanyl were administered intravenously. The patient's vital signs were monitored continuously by radiology nursing throughout the procedure.  Sedation Time: 150 minutes preoperative medications included ciprofloxacin 400 mg IV and Flagyl 500 mg IV prophylactically within 1 hr of skin incision. Additionally, 8 mg Decadron and 8 mg Zofran were administered intravenously in the preprocedural setting.  FLUOROSCOPY TIME:  30 minutes 12 seconds  CONTRAST:  231 mL OMNIPAQUE IOHEXOL 300 MG/ML  SOLN  TECHNIQUE: Informed consent was obtained from the patient following explanation of the procedure, risks, benefits and alternatives. The patient understands, agrees and consents for the procedure. All questions were addressed. A time out was performed.  Maximal barrier sterile technique utilized including caps, mask, sterile gowns, sterile gloves, large sterile drape, hand hygiene, and Betadine skin prep.  The right groin was interrogated with ultrasound. The right common femoral artery was identified. There is heavy atherosclerotic plaque in the common femoral artery. A relatively plaque free portion of artery was  identified. Image was obtained and stored. Local anesthesia was attained by infiltration with 1% lidocaine. Under real-time sonographic guidance, the common femoral artery was punctured with a 21 gauge micropuncture needle. Image was obtained and stored. With the aid of a transitional 5 French micro sheath, a Bentson wire was advanced into the abdominal aorta. The micro sheath was then exchanged for a working 5 [place] vascular sheath. A C2 Cobra catheter was advanced in the abdominal aorta and used to select the celiac artery. Celiac arteriogram was performed. No evidence of replaced right hepatic artery. Tumor blush is present in the mid right hepatic artery consistent with the known hepatocellular carcinoma.  The cobra catheter was advanced into the common hepatic artery in a common hepatic arteriogram was performed the a for hepatic arteries arise from the left hepatic artery. There appears to be an abnormal branching pattern of the right hepatic artery. Tumor blush is again identified in the mid right hepatic lobe. To better evaluate the right hepatic arterial anatomy, a micro catheter was advanced into the right hepatic artery in arteriography was performed in multiple obliquities. There is a proximal common trunk supplying A5 artery. Distal to this is a common trunk supplying the dorsal A8, A7 and A6 branches. The micro catheter was first advanced into the common trunk of the A5 and ventral 8 arteries. An arteriogram  was performed. There is no definite tumor blush.  The micro catheter was next advanced into the right hepatic artery into common trunk of the A8 and A6 arteries. Again, no definite tumor blush is identified. The micro catheter was pulled back into the right  Common trunk supplying the A7, A8 and A6 arteries an arteriogram was performed. The tumor blush was again identified. Therefore, it is likely arising from the A7 artery. The micro catheter was advanced into the 87 artery and an arteriogram was  performed. Tumor blush is not definitively identified on the initial arteriogram. Therefore, the micro catheter was again pulled back into the common trunk an arteriogram was performed demonstrating the tumor blush. The micro catheter therefore advanced into the a 8 artery and an arteriogram was performed. The tumor is outlined as a negative on the arteriogram suggesting the A8 artery flows around the tumor.  Therefore, the micro catheter was readvanced in the A7 artery. Chemo embolization was then performed with 75 mg of doxorubicin labeled to 1 vial of 100- 300 micron LC beads. Approximately 70% of the dose was administered be for flow began this low. No further administration of the therapeutic agent was performed at that time. The micro catheter was removed. A post  The catheter was removed. A limited right common femoral arteriogram was performed confirming common femoral arterial puncture. Embolization arteriogram was performed through the 5 French catheter. There is persistent tumor blush as expected. Secondary to the atherosclerotic disease in the right groin, hemostasis was attained by manual pressure following removal of the sheath. Overall, the patient tolerated the procedure very well.  COMPLICATIONS: None immediate  IMPRESSION: Successful chemoembolization using drug-eluting beads. Approximately 70% of a dose of 75 mg of doxorubicin labeled to 100- 300 micron LC beads was administered.  Signed,  Criselda Peaches, MD  [facility]  [facility]   Electronically Signed   By: Jacqulynn Cadet M.D.   On: 06/10/2014 08:42   Ir Angiogram Selective Each Additional Vessel  06/10/2014   CLINICAL DATA:  58 year old male with HCV and ETOH cirrhosis and newly diagnosed 3.2 cm hepatocellular carcinoma. He presents for the first stage of a combined liver directed therapy. He will undergo super selective DEB-TACE to be followed by percutaneous microwave embolization  tomorrow.  EXAM: IR EMBO TUMOR ORGAN ISCHEMIA INFARCT INC GUIDE ROADMAPPING; ADDITIONAL ARTERIOGRAPHY; IR ULTRASOUND GUIDANCE VASC ACCESS RIGHT; SELECTIVE VISCERAL ARTERIOGRAPHY  Date: 06/10/2014  PROCEDURE: 1. Ultrasound-guided puncture of the right common femoral artery 2. Catheterization of the celiac artery with arteriogram 3. Catheterization of the common hepatic artery with arteriogram 4. Catheterization of the right hepatic artery with arteriogram 5. Catheterization of the A5 artery with arteriogram 6. Catheterization of the joint trunk of the dorsal A8, A7 and A6 right hepatic arteries with arteriogram 7. Catheterization of the A7 artery with arteriogram 8. Catheterization of the A8 artery with arteriogram 9. Chemo embolization of the A7 artery Interventional Radiologist: Criselda Peaches Next item post embolization arteriogram, MD  ANESTHESIA/SEDATION: Moderate (conscious) sedation was used. Seven mg Versed, 300 mcg Fentanyl were administered intravenously. The patient's vital signs were monitored continuously by radiology nursing throughout the procedure.  Sedation Time: 150 minutes preoperative medications included ciprofloxacin 400 mg IV and Flagyl 500 mg IV prophylactically within 1 hr of skin incision. Additionally, 8 mg Decadron and 8 mg Zofran were administered intravenously in the preprocedural setting.  FLUOROSCOPY TIME:  30 minutes 12 seconds  CONTRAST:  231 mL OMNIPAQUE IOHEXOL  300 MG/ML  SOLN  TECHNIQUE: Informed consent was obtained from the patient following explanation of the procedure, risks, benefits and alternatives. The patient understands, agrees and consents for the procedure. All questions were addressed. A time out was performed.  Maximal barrier sterile technique utilized including caps, mask, sterile gowns, sterile gloves, large sterile drape, hand hygiene, and Betadine skin prep.  The right groin was interrogated with ultrasound. The right common femoral artery was identified.  There is heavy atherosclerotic plaque in the common femoral artery. A relatively plaque free portion of artery was identified. Image was obtained and stored. Local anesthesia was attained by infiltration with 1% lidocaine. Under real-time sonographic guidance, the common femoral artery was punctured with a 21 gauge micropuncture needle. Image was obtained and stored. With the aid of a transitional 5 French micro sheath, a Bentson wire was advanced into the abdominal aorta. The micro sheath was then exchanged for a working 5 [place] vascular sheath. A C2 Cobra catheter was advanced in the abdominal aorta and used to select the celiac artery. Celiac arteriogram was performed. No evidence of replaced right hepatic artery. Tumor blush is present in the mid right hepatic artery consistent with the known hepatocellular carcinoma.  The cobra catheter was advanced into the common hepatic artery in a common hepatic arteriogram was performed the a for hepatic arteries arise from the left hepatic artery. There appears to be an abnormal branching pattern of the right hepatic artery. Tumor blush is again identified in the mid right hepatic lobe. To better evaluate the right hepatic arterial anatomy, a micro catheter was advanced into the right hepatic artery in arteriography was performed in multiple obliquities. There is a proximal common trunk supplying A5 artery. Distal to this is a common trunk supplying the dorsal A8, A7 and A6 branches. The micro catheter was first advanced into the common trunk of the A5 and ventral 8 arteries. An arteriogram was performed. There is no definite tumor blush.  The micro catheter was next advanced into the right hepatic artery into common trunk of the A8 and A6 arteries. Again, no definite tumor blush is identified. The micro catheter was pulled back into the right  Common trunk supplying the A7, A8 and A6 arteries an arteriogram was performed. The tumor blush was again identified. Therefore,  it is likely arising from the A7 artery. The micro catheter was advanced into the 87 artery and an arteriogram was performed. Tumor blush is not definitively identified on the initial arteriogram. Therefore, the micro catheter was again pulled back into the common trunk an arteriogram was performed demonstrating the tumor blush. The micro catheter therefore advanced into the a 8 artery and an arteriogram was performed. The tumor is outlined as a negative on the arteriogram suggesting the A8 artery flows around the tumor.  Therefore, the micro catheter was readvanced in the A7 artery. Chemo embolization was then performed with 75 mg of doxorubicin labeled to 1 vial of 100- 300 micron LC beads. Approximately 70% of the dose was administered be for flow began this low. No further administration of the therapeutic agent was performed at that time. The micro catheter was removed. A post  The catheter was removed. A limited right common femoral arteriogram was performed confirming common femoral arterial puncture. Embolization arteriogram was performed through the 5 French catheter. There is persistent tumor blush as expected. Secondary to the atherosclerotic disease in the right groin, hemostasis was attained by manual pressure following removal of the sheath. Overall, the  patient tolerated the procedure very well.  COMPLICATIONS: None immediate  IMPRESSION: Successful chemoembolization using drug-eluting beads. Approximately 70% of a dose of 75 mg of doxorubicin labeled to 100- 300 micron LC beads was administered.  Signed,  Criselda Peaches, MD  [facility]  [facility] Radiology   Electronically Signed   By: Jacqulynn Cadet M.D.   On: 06/10/2014 08:42   Ir US Guide Vasc Access Right  06/10/2014   CLINICAL DATA:  58 year old male with HCV and ETOH cirrhosis and newly diagnosed 3.2 cm hepatocellular carcinoma. He presents for the first stage of a combined liver directed  therapy. He will undergo super selective DEB-TACE to be followed by percutaneous microwave embolization tomorrow.  EXAM: IR EMBO TUMOR ORGAN ISCHEMIA INFARCT INC GUIDE ROADMAPPING; ADDITIONAL ARTERIOGRAPHY; IR ULTRASOUND GUIDANCE VASC ACCESS RIGHT; SELECTIVE VISCERAL ARTERIOGRAPHY  Date: 06/10/2014  PROCEDURE: 1. Ultrasound-guided puncture of the right common femoral artery 2. Catheterization of the celiac artery with arteriogram 3. Catheterization of the common hepatic artery with arteriogram 4. Catheterization of the right hepatic artery with arteriogram 5. Catheterization of the A5 artery with arteriogram 6. Catheterization of the joint trunk of the dorsal A8, A7 and A6 right hepatic arteries with arteriogram 7. Catheterization of the A7 artery with arteriogram 8. Catheterization of the A8 artery with arteriogram 9. Chemo embolization of the A7 artery Interventional Radiologist: Criselda Peaches Next item post embolization arteriogram, MD  ANESTHESIA/SEDATION: Moderate (conscious) sedation was used. Seven mg Versed, 300 mcg Fentanyl were administered intravenously. The patient's vital signs were monitored continuously by radiology nursing throughout the procedure.  Sedation Time: 150 minutes preoperative medications included ciprofloxacin 400 mg IV and Flagyl 500 mg IV prophylactically within 1 hr of skin incision. Additionally, 8 mg Decadron and 8 mg Zofran were administered intravenously in the preprocedural setting.  FLUOROSCOPY TIME:  30 minutes 12 seconds  CONTRAST:  231 mL OMNIPAQUE IOHEXOL 300 MG/ML  SOLN  TECHNIQUE: Informed consent was obtained from the patient following explanation of the procedure, risks, benefits and alternatives. The patient understands, agrees and consents for the procedure. All questions were addressed. A time out was performed.  Maximal barrier sterile technique utilized including caps, mask, sterile gowns, sterile gloves, large sterile drape, hand hygiene, and Betadine skin  prep.  The right groin was interrogated with ultrasound. The right common femoral artery was identified. There is heavy atherosclerotic plaque in the common femoral artery. A relatively plaque free portion of artery was identified. Image was obtained and stored. Local anesthesia was attained by infiltration with 1% lidocaine. Under real-time sonographic guidance, the common femoral artery was punctured with a 21 gauge micropuncture needle. Image was obtained and stored. With the aid of a transitional 5 French micro sheath, a Bentson wire was advanced into the abdominal aorta. The micro sheath was then exchanged for a working 5 [place] vascular sheath. A C2 Cobra catheter was advanced in the abdominal aorta and used to select the celiac artery. Celiac arteriogram was performed. No evidence of replaced right hepatic artery. Tumor blush is present in the mid right hepatic artery consistent with the known hepatocellular carcinoma.  The cobra catheter was advanced into the common hepatic artery in a common hepatic arteriogram was performed the a for hepatic arteries arise from the left hepatic artery. There appears to be an abnormal branching pattern of the right hepatic artery. Tumor blush is again identified in the mid right hepatic lobe. To better evaluate the right hepatic arterial anatomy, a micro  catheter was advanced into the right hepatic artery in arteriography was performed in multiple obliquities. There is a proximal common trunk supplying A5 artery. Distal to this is a common trunk supplying the dorsal A8, A7 and A6 branches. The micro catheter was first advanced into the common trunk of the A5 and ventral 8 arteries. An arteriogram was performed. There is no definite tumor blush.  The micro catheter was next advanced into the right hepatic artery into common trunk of the A8 and A6 arteries. Again, no definite tumor blush is identified. The micro catheter was pulled back into the right  Common trunk supplying  the A7, A8 and A6 arteries an arteriogram was performed. The tumor blush was again identified. Therefore, it is likely arising from the A7 artery. The micro catheter was advanced into the 87 artery and an arteriogram was performed. Tumor blush is not definitively identified on the initial arteriogram. Therefore, the micro catheter was again pulled back into the common trunk an arteriogram was performed demonstrating the tumor blush. The micro catheter therefore advanced into the a 8 artery and an arteriogram was performed. The tumor is outlined as a negative on the arteriogram suggesting the A8 artery flows around the tumor.  Therefore, the micro catheter was readvanced in the A7 artery. Chemo embolization was then performed with 75 mg of doxorubicin labeled to 1 vial of 100- 300 micron LC beads. Approximately 70% of the dose was administered be for flow began this low. No further administration of the therapeutic agent was performed at that time. The micro catheter was removed. A post  The catheter was removed. A limited right common femoral arteriogram was performed confirming common femoral arterial puncture. Embolization arteriogram was performed through the 5 French catheter. There is persistent tumor blush as expected. Secondary to the atherosclerotic disease in the right groin, hemostasis was attained by manual pressure following removal of the sheath. Overall, the patient tolerated the procedure very well.  COMPLICATIONS: None immediate  IMPRESSION: Successful chemoembolization using drug-eluting beads. Approximately 70% of a dose of 75 mg of doxorubicin labeled to 100- 300 micron LC beads was administered.  Signed,  Criselda Peaches, MD  [facility]  [facility] Radiology   Electronically Signed   By: Jacqulynn Cadet M.D.   On: 06/10/2014 08:42   Ct Guide Tissue Ablation  06/10/2014   CLINICAL DATA:  58 year old male with HCV and ETOH cirrhosis complicated by  hepatocellular carcinoma. He underwent DEB-TACE yesterday and will undergo percutaneous thermal ablation today for combination therapy of his solitary hepatocellular carcinoma.  EXAM: CT GUIDED ABLATION  Date: 06/10/2014  PROCEDURE: 1. CT-guided percutaneous thermal ablation Interventional Radiologist:  Criselda Peaches, MD  ANESTHESIA/SEDATION: General anesthesia was provided by the anesthesiology service.  CONTRAST:  1104mL OMNIPAQUE IOHEXOL 300 MG/ML  SOLN  TECHNIQUE: Informed consent was obtained from the patient following explanation of the procedure, risks, benefits and alternatives. The patient understands, agrees and consents for the procedure. All questions were addressed. A time out was performed.  The patient was intubated by the anesthesiology service. Prophylactic antibiotics were on going with a ciprofloxacin and Flagyl. 1 unit of platelets was also hung for thrombocytopenia. A planning axial CT scan was performed. The 3.2 x 2.5 cm low-attenuation mass lesion centered in the right hepatic lobe was easily identified. Skin entry sites were selected and marked.  Maximal barrier sterile technique utilized including caps, mask, sterile gowns, sterile gloves, large sterile drape, hand hygiene, and Betadine skin prep.  Using intermittent CT fluoroscopic and axial CT imaging, to 15 cm PR probes were directed through the right antral lateral abdominal wall and into the lesion. Probes were position between 1 and 1.5 cm apart and maintained in parallel fashion. Percutaneous thermal ablation was then carried out. Both probes were powered at 14 W and ablation was continued for 9 min. The ablation was intermittently monitored with CT imaging at 3, 6 and 9 min. Following the ablation, the probes were removed with active cautery for tract ablation.  A CT scan of the liver with arterial and portal venous phase contrast was then performed. There appears to be excellent ablation of the tumor site. The ablation zone  measures approximately 4.6 x 4 cm and is centered directly on the lesion. Central air and high attenuation is consistent with a crenated, and desiccated hepatocellular carcinoma. There is peripheral hyper enhancement consistent with transient changes in arterial and portal venous perfusion. This is expected. The liver is cirrhotic. There is a moderate amount of perihepatic ascites. No additional hepatic lesion is identified No evidence of hemorrhage or other complication.  The patient tolerated the procedure very well.  COMPLICATIONS: None immediate  IMPRESSION: Successful stage II of combined transarterial chemotherapy and percutaneous thermal ablation procedure for 3.2 cm hepatocellular carcinoma.  Signed,  Criselda Peaches, MD  [facility]  [name] Radiology   Electronically Signed   By: Jacqulynn Cadet M.D.   On: 06/10/2014 10:59   Ir Embo Tumor Organ Ischemia Infarct Inc Guide Roadmapping  06/10/2014   CLINICAL DATA:  58 year old male with HCV and ETOH cirrhosis and newly diagnosed 3.2 cm hepatocellular carcinoma. He presents for the first stage of a combined liver directed therapy. He will undergo super selective DEB-TACE to be followed by percutaneous microwave embolization tomorrow.  EXAM: IR EMBO TUMOR ORGAN ISCHEMIA INFARCT INC GUIDE ROADMAPPING; ADDITIONAL ARTERIOGRAPHY; IR ULTRASOUND GUIDANCE VASC ACCESS RIGHT; SELECTIVE VISCERAL ARTERIOGRAPHY  Date: 06/10/2014  PROCEDURE: 1. Ultrasound-guided puncture of the right common femoral artery 2. Catheterization of the celiac artery with arteriogram 3. Catheterization of the common hepatic artery with arteriogram 4. Catheterization of the right hepatic artery with arteriogram 5. Catheterization of the A5 artery with arteriogram 6. Catheterization of the joint trunk of the dorsal A8, A7 and A6 right hepatic arteries with arteriogram 7. Catheterization of the A7 artery with arteriogram 8. Catheterization of the A8  artery with arteriogram 9. Chemo embolization of the A7 artery Interventional Radiologist: Criselda Peaches Next item post embolization arteriogram, MD  ANESTHESIA/SEDATION: Moderate (conscious) sedation was used. Seven mg Versed, 300 mcg Fentanyl were administered intravenously. The patient's vital signs were monitored continuously by radiology nursing throughout the procedure.  Sedation Time: 150 minutes preoperative medications included ciprofloxacin 400 mg IV and Flagyl 500 mg IV prophylactically within 1 hr of skin incision. Additionally, 8 mg Decadron and 8 mg Zofran were administered intravenously in the preprocedural setting.  FLUOROSCOPY TIME:  30 minutes 12 seconds  CONTRAST:  231 mL OMNIPAQUE IOHEXOL 300 MG/ML  SOLN  TECHNIQUE: Informed consent was obtained from the patient following explanation of the procedure, risks, benefits and alternatives. The patient understands, agrees and consents for the procedure. All questions were addressed. A time out was performed.  Maximal barrier sterile technique utilized including caps, mask, sterile gowns, sterile gloves, large sterile drape, hand hygiene, and Betadine skin prep.  The right groin was interrogated with ultrasound. The right common femoral artery was identified. There is heavy atherosclerotic plaque in the  common femoral artery. A relatively plaque free portion of artery was identified. Image was obtained and stored. Local anesthesia was attained by infiltration with 1% lidocaine. Under real-time sonographic guidance, the common femoral artery was punctured with a 21 gauge micropuncture needle. Image was obtained and stored. With the aid of a transitional 5 French micro sheath, a Bentson wire was advanced into the abdominal aorta. The micro sheath was then exchanged for a working 5 [place] vascular sheath. A C2 Cobra catheter was advanced in the abdominal aorta and used to select the celiac artery. Celiac arteriogram was performed. No evidence of  replaced right hepatic artery. Tumor blush is present in the mid right hepatic artery consistent with the known hepatocellular carcinoma.  The cobra catheter was advanced into the common hepatic artery in a common hepatic arteriogram was performed the a for hepatic arteries arise from the left hepatic artery. There appears to be an abnormal branching pattern of the right hepatic artery. Tumor blush is again identified in the mid right hepatic lobe. To better evaluate the right hepatic arterial anatomy, a micro catheter was advanced into the right hepatic artery in arteriography was performed in multiple obliquities. There is a proximal common trunk supplying A5 artery. Distal to this is a common trunk supplying the dorsal A8, A7 and A6 branches. The micro catheter was first advanced into the common trunk of the A5 and ventral 8 arteries. An arteriogram was performed. There is no definite tumor blush.  The micro catheter was next advanced into the right hepatic artery into common trunk of the A8 and A6 arteries. Again, no definite tumor blush is identified. The micro catheter was pulled back into the right  Common trunk supplying the A7, A8 and A6 arteries an arteriogram was performed. The tumor blush was again identified. Therefore, it is likely arising from the A7 artery. The micro catheter was advanced into the 87 artery and an arteriogram was performed. Tumor blush is not definitively identified on the initial arteriogram. Therefore, the micro catheter was again pulled back into the common trunk an arteriogram was performed demonstrating the tumor blush. The micro catheter therefore advanced into the a 8 artery and an arteriogram was performed. The tumor is outlined as a negative on the arteriogram suggesting the A8 artery flows around the tumor.  Therefore, the micro catheter was readvanced in the A7 artery. Chemo embolization was then performed with 75 mg of doxorubicin labeled to 1 vial of 100- 300 micron LC  beads. Approximately 70% of the dose was administered be for flow began this low. No further administration of the therapeutic agent was performed at that time. The micro catheter was removed. A post  The catheter was removed. A limited right common femoral arteriogram was performed confirming common femoral arterial puncture. Embolization arteriogram was performed through the 5 French catheter. There is persistent tumor blush as expected. Secondary to the atherosclerotic disease in the right groin, hemostasis was attained by manual pressure following removal of the sheath. Overall, the patient tolerated the procedure very well.  COMPLICATIONS: None immediate  IMPRESSION: Successful chemoembolization using drug-eluting beads. Approximately 70% of a dose of 75 mg of doxorubicin labeled to 100- 300 micron LC beads was administered.  Signed,  Criselda Peaches, MD  [facility]  [facility] Radiology   Electronically Signed   By: Jacqulynn Cadet M.D.   On: 06/10/2014 08:42    Assessment/Plan: [place]  S/p thermal ablation today  S/p drug eluting transarterial chemoembolization  on 6/25 Continue antibiotics, will recheck labs in am  Discharge 6/27 if stable. Patient to continue Cipro x 5 days after discharge, repeat CMP in 1 week and follow up in clinic in 4-6 weeks with Gadolinium enhanced MRI of the abdomen.     LOS: 1 day    Hedy Jacob PA-C 06/10/2014 3:30 PM

## 2014-06-10 NOTE — Anesthesia Postprocedure Evaluation (Signed)
Anesthesia Post Note  Patient: Jeffrey Snow  Procedure(s) Performed: Procedure(s) (LRB): MICROWAVE CRYO ABLATION (N/A)  Anesthesia type: General  Patient location: PACU  Post pain: Pain level controlled  Post assessment: Post-op Vital signs reviewed  Last Vitals: BP 115/56  Pulse 80  Temp(Src) 36.3 C (Oral)  Resp 16  Ht 5\' 8"  (1.727 m)  Wt 145 lb (65.772 kg)  BMI 22.05 kg/m2  SpO2 99%  Post vital signs: Reviewed  Level of consciousness: sedated  Complications: No apparent anesthesia complications

## 2014-06-10 NOTE — Transfer of Care (Signed)
Immediate Anesthesia Transfer of Care Note  Patient: Jeffrey Snow  Procedure(s) Performed: Procedure(s): MICROWAVE CRYO ABLATION (N/A)  Patient Location: PACU  Anesthesia Type:General  Level of Consciousness: awake, oriented, patient cooperative, lethargic and responds to stimulation  Airway & Oxygen Therapy: Patient Spontanous Breathing and Patient connected to face mask oxygen  Post-op Assessment: Report given to PACU RN, Post -op Vital signs reviewed and stable and Patient moving all extremities  Post vital signs: Reviewed and stable  Complications: No apparent anesthesia complications

## 2014-06-10 NOTE — Procedures (Signed)
Interventional Radiology Procedure Note  Procedure:  Successful microwave ablation of hepatocellular carcinoma. Complications: None immediate Recommendations: - Bedrest x 6 hrs - ADAT - Continue cipro/flagyl - D/C home on cipro x 5 days - Follow up in clinic in 1 mo with MRI abd w/gado  Signed,  Criselda Peaches, MD [facility] Radiology

## 2014-06-11 ENCOUNTER — Other Ambulatory Visit: Payer: Self-pay | Admitting: Radiology

## 2014-06-11 DIAGNOSIS — C228 Malignant neoplasm of liver, primary, unspecified as to type: Secondary | ICD-10-CM | POA: Diagnosis not present

## 2014-06-11 DIAGNOSIS — C220 Liver cell carcinoma: Secondary | ICD-10-CM

## 2014-06-11 LAB — COMPREHENSIVE METABOLIC PANEL
ALT: 72 U/L — ABNORMAL HIGH (ref 0–53)
AST: 199 U/L — ABNORMAL HIGH (ref 0–37)
Albumin: 2.5 g/dL — ABNORMAL LOW (ref 3.5–5.2)
Alkaline Phosphatase: 68 U/L (ref 39–117)
BUN: 8 mg/dL (ref 6–23)
CO2: 21 mEq/L (ref 19–32)
Calcium: 8.1 mg/dL — ABNORMAL LOW (ref 8.4–10.5)
Chloride: 104 mEq/L (ref 96–112)
Creatinine, Ser: 0.9 mg/dL (ref 0.50–1.35)
GFR calc Af Amer: >90 mL/min (ref >90)
GFR calc non Af Amer: >90 mL/min (ref >90)
Glucose, Bld: 98 mg/dL (ref 70–99)
Potassium: 3.3 mEq/L — ABNORMAL LOW (ref 3.7–5.3)
Sodium: 136 mEq/L — ABNORMAL LOW (ref 137–147)
Total Bilirubin: 1.5 mg/dL — ABNORMAL HIGH (ref 0.3–1.2)
Total Protein: 5.3 g/dL — ABNORMAL LOW (ref 6.0–8.3)

## 2014-06-11 LAB — CBC
HCT: 26.7 % — ABNORMAL LOW (ref 39.0–52.0)
Hemoglobin: 9.2 g/dL — ABNORMAL LOW (ref 13.0–17.0)
MCH: 35.4 pg — ABNORMAL HIGH (ref 26.0–34.0)
MCHC: 34.5 g/dL (ref 30.0–36.0)
MCV: 102.7 fL — ABNORMAL HIGH (ref 78.0–100.0)
Platelets: 47 10*3/uL — ABNORMAL LOW (ref 150–400)
RBC: 2.6 MIL/uL — ABNORMAL LOW (ref 4.22–5.81)
RDW: 14.3 % (ref 11.5–15.5)
WBC: 4.4 10*3/uL (ref 4.0–10.5)

## 2014-06-11 LAB — PREPARE PLATELET PHERESIS: Unit division: 0

## 2014-06-11 MED ORDER — DSS 100 MG PO CAPS: 100.0000 mg | ORAL_CAPSULE | Freq: Two times a day (BID) | ORAL | Status: DC

## 2014-06-11 MED ORDER — HYDROCODONE-ACETAMINOPHEN 5-325 MG PO TABS: 1.0000 | ORAL_TABLET | Freq: Three times a day (TID) | ORAL | Status: DC | PRN

## 2014-06-11 MED ORDER — CIPROFLOXACIN HCL 250 MG PO TABS: 250.0000 mg | ORAL_TABLET | Freq: Two times a day (BID) | ORAL | Status: DC

## 2014-06-11 NOTE — Discharge Instructions (Signed)
Chemoembolization, Care After Refer to this sheet in the next few weeks. These instructions provide you with information on caring for yourself after your procedure. Your health care provider may also give you more specific instructions. Your treatment has been planned according to current medical practices, but problems sometimes occur. Call your health care provider if you have any problems or questions after your procedure. WHAT TO EXPECT AFTER THE PROCEDURE  After your procedure, it is typical to have the following: You might have a slight fever for 1-2 weeks after the procedure. If it gets worse, let your health care provider know.  You might feel tired and not hungry. This is normal. These feelings should go away in about 1 week. HOME CARE INSTRUCTIONS  Take any medicine your health care provider prescribed for pain, nausea, or fever. Follow the directions carefully.  Ask your health care provider whether you can take over-the-counter medicines for pain or fever. Do not take aspirin or anti-inflammatory medicines such as ibuprofen or naproxen unless your health care provider says that you should. These medicines can increase the chances of bleeding.  If you were given a small breathing device (incentive spirometer), be sure to use it. It helps keep your lungs clear while you are recovering. You will not need this after your activity level is back to normal.  Do not get the puncture site wet for the first few days after surgery or until your health care provider says it is okay.  You should be able to resume your normal routine in about 1 week.  During the first month after your procedure, you will probably need to go back to your health care provider for some simple tests. Scans and blood tests will help determine whether the procedure worked. SEEK MEDICAL CARE IF:  Blood or fluid leaks from the wound, or the wound becomes red or swollen.  You become nauseous or throw up for more than 2  days after surgery.  Your pain or fever becomes worse than it was when you left the hospital.  You cannot drink clear liquids such as water or diluted juice or tea 24 hours after your procedure.  You develop a rash. SEEK IMMEDIATE MEDICAL CARE IF:  You have a fever that gets worse or does not go away after 1 week.  You develop pain, swelling, or discoloration in your legs.  Your legs become pale, cold, or blue.  You develop shortness of breath, feel faint, or pass out.  You have chest pain.  You have weakness or difficulty moving your arms or legs.  You have changes in your speech or vision. Document Released: 07/31/2011 Document Revised: 09/22/2013 Document Reviewed: 08/09/2013 [facility] Patient Information 2015 [place], [place]. This information is not intended to replace advice given to you by your health care provider. Make sure you discuss any questions you have with your health care provider.   Radiofrequency Ablation of Liver Tumors Ablation is a procedure that destroys specific cells in the body. Radiofrequency means that high-energy radio waves are used for this procedure. In radiofrequency ablation of liver tumors, a needle-like probe is placed close to the tumor. The probe uses radio waves to produce heat that kills the cancer cells. Months later, the dead tumor cells turn into harmless scar tissue. This procedure is usually used:   For smaller tumors (less than about 1 in [3.8 cm]).   In people whose medical condition makes surgery too dangerous.   For tumors that are in risky locations,  have not shrunk with chemotherapy, or that have come back after having been removed through surgery.   In people who have certain types of liver cancer and are on a waiting list for a liver transplant.  LET [facility] CARE PROVIDER KNOW ABOUT:   All allergies you have.  All medicines that you are taking, including vitamins, herbs, eye drops, creams, and over-the-counter  medicines.  Previous problems you or members of your family have had with the use of anesthetics.  Any blood disorders you have.  Previous surgeries you have had.  Medical conditions you have.  Possibility of pregnancy, if this applies. RISKS AND COMPLICATIONS  Generally, this is a safe procedure. However, as with any procedure, complications can occur. Possible complications include:   Infection.   Bleeding.   Pain.   Flu-like symptoms, including fever and achiness.   Injury to the surrounding organs, such as a lung or the intestines. BEFORE THE PROCEDURE   You may have blood tests done. These tests can help show how well your kidneys and liver are working. They can also show how well your blood clots.   Only take medicines as directed by your health care provider. You may need to stop taking medicines (such as blood thinners, aspirin, or nonsteroidal anti-inflammatory drugs) before the procedure.   Do not eat or drink for at least 6 hours before the procedure, or as directed by your health care provider.   Arrange for someone to drive you home after the procedure.  PROCEDURE   You will lie on an exam table and will be connected to monitors that keep track of your heart rate, blood pressure, and breathing throughout the procedure. You will have an IV tube placed in a vein.   You may be given a medicine that makes you go to sleep throughout the procedure (general anesthetic) or a medicine that helps you relax (sedative). You may also be given a medicine to numb the area where the probe will pass through the skin (local anesthetic).   Radiofrequency ablation can be done:   Through a regular surgical cut (incision). This type of ablation is called surgical radiofrequency ablation.   Through a tiny surgical incision, using a camera-like device to guide the probe to the liver tumor. This type of ablation is called laparoscopic radiofrequency ablation.   By using  needle-sized electrodes that are passed through the skin directly into the area of the liver being treated. This type of ablation is called percutaneous radiofrequency ablation.   Ultrasound or CT scans are used to make sure the tip of the probe is in the right location.   Once the probe has been situated next to the tumor, radio waves will produce heat that kills the tumor cells. Depending on the size of the tumor, the probe may need to be repositioned several times.   Once the tumor has been destroyed, the probe will be removed and a bandage will be applied.  AFTER THE PROCEDURE  If you received a sedative or a general anesthetic during the procedure, you will be sleepy for the first few hours.   You may have some pain or nausea. This can usually be controlled with medicines.   You will stay in the recovery room until you are awake and able to drink fluids.  Document Released: 04/20/2009 Document Revised: 12/07/2013 Document Reviewed: 08/09/2013 [facility] Patient Information 2015 [place], [place]. This information is not intended to replace advice given to you by your health care  provider. Make sure you discuss any questions you have with your health care provider.

## 2014-06-11 NOTE — Progress Notes (Signed)
UR Completed.  Amai Cappiello Jane [phone] 06/11/2014  

## 2014-06-11 NOTE — Discharge Summary (Signed)
Physician Discharge Summary  Patient ID: Jeffrey Snow MRN: [id number] DOB/AGE: [birth year] 58 y.o.  Admit date: 06/09/2014 Discharge date: 06/11/2014  Admission Diagnoses: Active Problems:   HCC (hepatocellular carcinoma)  Discharge Diagnoses:  Active Problems:   HCC (hepatocellular carcinoma)    Procedures: Hepatic arteriogram with chemoembolization of hepatic tumor 6/25 CT guided percutaneous microwave ablation of hepatic tumor 6/26  Discharged Condition: good  Hospital Course: HPI: Jeffrey Snow is an 58 y.o. male with history of HCV and EtOH who is found to have a liver lesion c/w HCC. He was seen in consult by Dr. Laurence Ferrari, please see IR Rad eval under Imaging section for full details.  He treatment was decided and he is scheduled for TACE(Transarterial ChemoEmbolization) of the liver lesion today. He will then be admitted for observation and if no complications, will then have subsequent percutaneous microwave ablation of the tumor tomorrow under CT guidance.  PMHx, meds, imaging, labs reviewed.  Pt with hx of thrombocytopenia, PLTs were ordered for transfusion. Summary:  Pt was brought to IR suite for TACE procedure. Tolerated this well on 6/25 with no significant complications. PLTs were transfused prior to procedure. He was then admitted to the floor in stable condition. The following day, on 6/26, he was taken to CT department. After GET anesthesia initiated, he underwent successful CT guided microwave ablation of the same hepatic tumor. Again, no complications. He was sent back to the floor in stable condition. No complications overnight. Pt is tolerating regular diet and has essentially no pain. Labs reviewed, slight bump in LFTs as expected. He is determined to be stable for discharge. All instructions and medications were reviewed. He will take Cipro 250mg  BID for another 5 days. He is to come have his blood drawn for CMET next week. He will follow up in 4-6  weeks with Dr. Laurence Ferrari with and MRI prior to that visit for reassessment of his tumor. Pt understands all discussed   Consults: None   Discharge Exam: Blood pressure 107/58, pulse 67, temperature 97.5 F (36.4 C), temperature source Oral, resp. rate 16, height 5\' 8"  (1.727 m), weight 145 lb (65.772 kg), SpO2 100.00%. Lungs: CTA without w/r/r Heart: Regular Abdomen:soft, NT, perc puncture sites clean, dry, no hematoma. Ext: (R)groin soft, small ecchymosis, no hematoma. Foot warm, brisk cap refill.    Disposition: 06-Home-Health Care Svc  Discharge Instructions   Call MD for:  difficulty breathing, headache or visual disturbances    Complete by:  As directed      Call MD for:  persistant dizziness or light-headedness    Complete by:  As directed      Call MD for:  persistant nausea and vomiting    Complete by:  As directed      Call MD for:  redness, tenderness, or signs of infection (pain, swelling, redness, odor or green/yellow discharge around incision site)    Complete by:  As directed      Call MD for:  severe uncontrolled pain    Complete by:  As directed      Call MD for:  temperature >100.4    Complete by:  As directed      Diet - low sodium heart healthy    Complete by:  As directed      Driving Restrictions    Complete by:  As directed   Avoid driving for a few days if possible     Increase activity slowly    Complete by:  As directed  May shower / Bathe    Complete by:  As directed      May walk up steps    Complete by:  As directed      No dressing needed    Complete by:  As directed             Medication List         ALPRAZolam 0.5 MG tablet  Commonly known as:  XANAX  Take 0.5 mg by mouth 3 (three) times daily.     aspirin EC 81 MG tablet  Take 81 mg by mouth daily.     benazepril 40 MG tablet  Commonly known as:  LOTENSIN  Take 40 mg by mouth every morning.     ciprofloxacin 250 MG tablet  Commonly known as:  CIPRO  Take 1 tablet  (250 mg total) by mouth 2 (two) times daily.     DSS 100 MG Caps  Take 100 mg by mouth 2 (two) times daily.     ENSURE PLUS Liqd  Take 237 mLs by mouth 4 (four) times daily.     ergocalciferol 50000 UNITS capsule  Commonly known as:  DRISDOL  Take 1 capsule (50,000 Units total) by mouth once a week.     eucerin cream  Apply 1 application topically as needed for dry skin (Red spots).     FLUoxetine 20 MG capsule  Commonly known as:  PROZAC  Take 20 mg by mouth every morning.     folic acid 1 MG tablet  Commonly known as:  FOLVITE  Take 1 mg by mouth daily.     hydrochlorothiazide 25 MG tablet  Commonly known as:  HYDRODIURIL  Take 25 mg by mouth every morning.     HYDROcodone-acetaminophen 5-325 MG per tablet  Commonly known as:  NORCO/VICODIN  Take 1-2 tablets by mouth every 8 (eight) hours as needed for moderate pain or severe pain.     hydrocortisone 2.5 % rectal cream  Commonly known as:  ANUSOL-HC  Place 1 application rectally 2 (two) times daily as needed for hemorrhoids or itching.     ibuprofen 200 MG tablet  Commonly known as:  ADVIL,MOTRIN  Take 400 mg by mouth 3 (three) times daily as needed (Pain).     thiamine 100 MG tablet  Take 1 tablet (100 mg total) by mouth daily.     zolpidem 10 MG tablet  Commonly known as:  AMBIEN  Take 10 mg by mouth at bedtime.           Follow-up Information   Follow up with [facility], HEATH K, MD In 4 weeks. (Tammy from Dr. Laurence Ferrari office will call you with appointment date and time)    Specialty:  Interventional Radiology   Contact information:   [address] [id number]       Signed: Ascencion Dike PA-C 06/11/2014, 8:24 AM

## 2014-06-14 ENCOUNTER — Telehealth: Payer: Self-pay | Admitting: Dietician

## 2014-06-14 LAB — TYPE AND SCREEN
ABO/RH(D): O POS
Antibody Screen: NEGATIVE
Unit division: 0
Unit division: 0

## 2014-06-14 NOTE — Telephone Encounter (Signed)
Brief Outpatient Oncology Nutrition Note  Patient has been identified to be at risk on malnutrition screen.  Wt Readings from Last 10 Encounters:  06/09/14 145 lb (65.772 kg)  06/09/14 145 lb (65.772 kg)  05/17/14 145 lb (65.772 kg)  04/21/14 180 lb (81.647 kg)  03/31/14 181 lb (82.101 kg)  03/04/14 181 lb (82.101 kg)  03/03/14 182 lb (82.555 kg)  01/27/14 174 lb (78.926 kg)  12/27/13 166 lb (75.297 kg)  12/27/13 166 lb (75.297 kg)    Dx:  Liver Cancer.    Called patient who was unavailable.  Last seen by inpatient RD 5/7.  Patient's reported IBW was 150 lbs and thought 180 lbs was inaccurate.  Patient was drinking Ensure.  Left message with [place] RD's contact information.  Antonieta Iba, RD, LDN

## 2014-06-16 ENCOUNTER — Other Ambulatory Visit (HOSPITAL_COMMUNITY): Payer: Self-pay | Admitting: Interventional Radiology

## 2014-06-16 DIAGNOSIS — C220 Liver cell carcinoma: Secondary | ICD-10-CM

## 2014-06-21 ENCOUNTER — Telehealth: Payer: Self-pay | Admitting: Dietician

## 2014-06-21 NOTE — Telephone Encounter (Signed)
Brief Outpatient Oncology Nutrition Note  Patient has been identified to be at risk on malnutrition screen.  Wt Readings from Last 10 Encounters:  06/09/14 145 lb (65.772 kg)  06/09/14 145 lb (65.772 kg)  05/17/14 145 lb (65.772 kg)  04/21/14 180 lb (81.647 kg)  03/31/14 181 lb (82.101 kg)  03/04/14 181 lb (82.101 kg)  03/03/14 182 lb (82.555 kg)  01/27/14 174 lb (78.926 kg)  12/27/13 166 lb (75.297 kg)  12/27/13 166 lb (75.297 kg)   Dx:  Liver Cancer  Called patient who was unavailable.  Last seen by inpatient RD 5/7.  Patient's reported UBW was 150 lbs adn thought that 180 lbs was inaccurate.  Patient was drinking Ensure.    Left message with the [place] RD"s contact information.  Antonieta Iba, RD, LDN

## 2014-06-22 ENCOUNTER — Telehealth: Payer: Self-pay | Admitting: Nutrition

## 2014-06-22 NOTE — Telephone Encounter (Addendum)
Patient called and left a message to call him.  Contacted patient on telephone who reports he has had issues with abdominal pain.  States he has increased gas and has been drinking carbonated beverages to ease the discomfort.  Patient has oral nutrition supplements to drink. Reports everything he eats or drinks gives him diarrhea.  States he is not going to eat today.  Encouraged increased clear liquids today as tolerated.  Educated patient to call EMS if pain or condition worsens. Recommended patient follow up with MD.  Patient verbalized understanding.

## 2014-06-27 ENCOUNTER — Encounter: Payer: Self-pay | Admitting: Emergency Medicine

## 2014-06-27 ENCOUNTER — Other Ambulatory Visit (INDEPENDENT_AMBULATORY_CARE_PROVIDER_SITE_OTHER): Payer: Self-pay | Admitting: Radiology

## 2014-06-27 ENCOUNTER — Encounter: Payer: Self-pay | Admitting: Internal Medicine

## 2014-06-30 NOTE — Telephone Encounter (Signed)
Pt to call to schedule appointment as he has not been seen since march.

## 2014-07-06 ENCOUNTER — Telehealth: Payer: Self-pay

## 2014-07-06 ENCOUNTER — Other Ambulatory Visit: Payer: Self-pay

## 2014-07-06 ENCOUNTER — Ambulatory Visit (INDEPENDENT_AMBULATORY_CARE_PROVIDER_SITE_OTHER): Payer: Medicaid Other | Admitting: Internal Medicine

## 2014-07-06 ENCOUNTER — Other Ambulatory Visit: Payer: Self-pay | Admitting: Oncology

## 2014-07-06 ENCOUNTER — Telehealth: Payer: Self-pay | Admitting: Oncology

## 2014-07-06 ENCOUNTER — Encounter: Payer: Self-pay | Admitting: Internal Medicine

## 2014-07-06 VITALS — BP 142/66 | HR 54 | Temp 97.6°F | Resp 16 | Ht 68.0 in | Wt 144.0 lb

## 2014-07-06 DIAGNOSIS — G8929 Other chronic pain: Secondary | ICD-10-CM | POA: Insufficient documentation

## 2014-07-06 DIAGNOSIS — F32A Depression, unspecified: Secondary | ICD-10-CM

## 2014-07-06 DIAGNOSIS — C220 Liver cell carcinoma: Secondary | ICD-10-CM

## 2014-07-06 DIAGNOSIS — R778 Other specified abnormalities of plasma proteins: Secondary | ICD-10-CM

## 2014-07-06 DIAGNOSIS — D539 Nutritional anemia, unspecified: Secondary | ICD-10-CM

## 2014-07-06 DIAGNOSIS — R636 Underweight: Secondary | ICD-10-CM

## 2014-07-06 DIAGNOSIS — E876 Hypokalemia: Secondary | ICD-10-CM

## 2014-07-06 DIAGNOSIS — R7989 Other specified abnormal findings of blood chemistry: Secondary | ICD-10-CM

## 2014-07-06 DIAGNOSIS — E559 Vitamin D deficiency, unspecified: Secondary | ICD-10-CM

## 2014-07-06 DIAGNOSIS — C228 Malignant neoplasm of liver, primary, unspecified as to type: Secondary | ICD-10-CM

## 2014-07-06 DIAGNOSIS — F329 Major depressive disorder, single episode, unspecified: Secondary | ICD-10-CM

## 2014-07-06 DIAGNOSIS — D696 Thrombocytopenia, unspecified: Secondary | ICD-10-CM

## 2014-07-06 DIAGNOSIS — R1011 Right upper quadrant pain: Secondary | ICD-10-CM

## 2014-07-06 DIAGNOSIS — K7030 Alcoholic cirrhosis of liver without ascites: Secondary | ICD-10-CM

## 2014-07-06 DIAGNOSIS — F3289 Other specified depressive episodes: Secondary | ICD-10-CM

## 2014-07-06 LAB — COMPREHENSIVE METABOLIC PANEL
ALT: 39 U/L (ref 0–53)
AST: 71 U/L — ABNORMAL HIGH (ref 0–37)
Albumin: 3.2 g/dL — ABNORMAL LOW (ref 3.5–5.2)
Alkaline Phosphatase: 101 U/L (ref 39–117)
BUN: 7 mg/dL (ref 6–23)
CO2: 28 mEq/L (ref 19–32)
Calcium: 8.8 mg/dL (ref 8.4–10.5)
Chloride: 99 mEq/L (ref 96–112)
Creat: 0.66 mg/dL (ref 0.50–1.35)
Glucose, Bld: 116 mg/dL — ABNORMAL HIGH (ref 70–99)
Potassium: 3.6 mEq/L (ref 3.5–5.3)
Sodium: 134 mEq/L — ABNORMAL LOW (ref 135–145)
Total Bilirubin: 2.3 mg/dL — ABNORMAL HIGH (ref 0.2–1.2)
Total Protein: 6.4 g/dL (ref 6.0–8.3)

## 2014-07-06 MED ORDER — OXYCODONE HCL 5 MG PO CAPS: 5.0000 mg | ORAL_CAPSULE | ORAL | Status: DC | PRN

## 2014-07-06 MED ORDER — FOLIC ACID 1 MG PO TABS: 1.0000 mg | ORAL_TABLET | Freq: Every day | ORAL | Status: DC

## 2014-07-06 NOTE — Telephone Encounter (Signed)
lmonvm advising the pt of his lab and f/u appt in July.

## 2014-07-06 NOTE — Progress Notes (Signed)
Call from Mickel Baas at [facility] - pt needs to be seen for follow up with GM.  Per GM - schedule for 7/28 at 4 pm.  Lab orders entered.  POF sent.   Let Mickel Baas know d/t - she will let pt know of appointment.

## 2014-07-06 NOTE — Progress Notes (Signed)
Patient ID: Jeffrey Snow, male   DOB: [birth year], 59 y.o.   MRN: [id number]   Jeffrey Snow, is a 58 y.o. male  ZJQ:[id number]  [id number]  DOB - May 24, 1956  CC:  Chief Complaint  Patient presents with  . Follow-up       HPI: Jeffrey Snow is a 58 y.o. male here today for follow up after several hospitalizations and multiple significant health related developments. Since he was last seen in the office, he was presumptively diagnosed with [place] based on AFP levels. He was not a candidate for biopsy or surgical intervention because of his low platelet levels and high surgical risk. Since then he has been seen by Interventional Radiology and had a Transarterial Drug eluting bead  Embolization (TACE) on 6.25.2015. He has not followed up with Oncology as recommended and per GI they agree with Oncology follow up rather than GI for the [facility]. Furthermore, routine screening for cancer would be based on his overall prognosis as determined by Oncology.   Pt does not want to discuss advanced directive at this time.  His only other complaint at this time is pain in the area of the entry site for emobolization and intermittently in the right upper quadrant.   Pt reports that he has stopped drinking any ETOH and is eating fairly well. His appetite has decreased since his diagnosis.  Patient has No headache, No chest pain, No abdominal pain - No Nausea, No new weakness tingling or numbness, No Cough - SOB.  No Known Allergies Past Medical History  Diagnosis Date  . Depression   . Anxiety   . Panic attack   . Hypertension   . Hepatitis C   . Claudication of left lower extremity m-1  . Abnormal MRI of abdomen, liver  04/21/2014  . Cirrhosis, alcoholic 03/17/6833  . Liver mass   . [place] (hepatocellular carcinoma)    Current Outpatient Prescriptions on File Prior to Visit  Medication Sig Dispense Refill  . ALPRAZolam (XANAX) 0.5 MG tablet Take 0.5 mg by mouth 3 (three) times daily.      Marland Kitchen  aspirin EC 81 MG tablet Take 81 mg by mouth daily.      . benazepril (LOTENSIN) 40 MG tablet Take 40 mg by mouth every morning.      Marland Kitchen ENSURE PLUS (ENSURE PLUS) LIQD Take 237 mLs by mouth 4 (four) times daily.      . ergocalciferol (DRISDOL) 50000 UNITS capsule Take 1 capsule (50,000 Units total) by mouth once a week.  4 capsule  12  . FLUoxetine (PROZAC) 20 MG capsule Take 20 mg by mouth every morning.       . hydrochlorothiazide (HYDRODIURIL) 25 MG tablet Take 25 mg by mouth every morning.      . hydrocortisone (ANUSOL-HC) 2.5 % rectal cream Place 1 application rectally 2 (two) times daily as needed for hemorrhoids or itching.      . Skin Protectants, Misc. (EUCERIN) cream Apply 1 application topically as needed for dry skin (Red spots).      . thiamine 100 MG tablet Take 1 tablet (100 mg total) by mouth daily.  30 tablet  0  . zolpidem (AMBIEN) 10 MG tablet Take 10 mg by mouth at bedtime.       . docusate sodium 100 MG CAPS Take 100 mg by mouth 2 (two) times daily.  20 capsule  0  . ibuprofen (ADVIL,MOTRIN) 200 MG tablet Take 400 mg by mouth 3 (three) times daily as needed (  Pain).       No current facility-administered medications on file prior to visit.   Family History  Problem Relation Age of Onset  . Diabetes Father   . Diabetes Brother   . Diabetes Brother    History   Social History  . Marital Status: Married    Spouse Name: N/A    Number of Children: 3  . Years of Education: N/A   Occupational History  .  [employer]   Social History Main Topics  . Smoking status: Former Smoker -- 0.50 packs/day    Quit date: 08/21/2011  . Smokeless tobacco: Never Used  . Alcohol Use: 3.6 oz/week    6 Cans of beer per week     Comment: Occasional beer on the weekends  . Drug Use: No     Comment: abused cocaine in the 80's  . Sexual Activity: Not on file   Other Topics Concern  . Not on file   Social History Narrative   Single, never married.  Lives alone.  3 daughters, 16  grandchildren who are not very involved in the patient's care.  Quit smoking and heavy drinking 3.5 years ago.     Review of Systems: Constitutional: Negative for fever, chills, diaphoresis, activity change, and fatigue. HENT: Negative for ear pain, nosebleeds, congestion, facial swelling, rhinorrhea, neck pain, neck stiffness and ear discharge.  Eyes: Negative for pain, discharge, redness, itching and visual disturbance. Respiratory: Negative for cough, choking, chest tightness, shortness of breath, wheezing and stridor.  Cardiovascular: Negative for chest pain, palpitations and leg swelling. Gastrointestinal: Negative for abdominal distention. Pt has RUQ pain. Genitourinary: Negative for dysuria, urgency, frequency, hematuria, flank pain, decreased urine volume, difficulty urinating and dyspareunia.  Musculoskeletal: Negative for back pain, joint swelling, arthralgia and gait problem. Neurological: Negative for dizziness, tremors, seizures, syncope, facial asymmetry, speech difficulty, weakness, light-headedness, numbness and headaches.  Hematological: Negative for adenopathy. Does not bruise/bleed easily. Psychiatric/Behavioral: Negative for hallucinations, behavioral problems, confusion, dysphoric mood, decreased concentration and agitation.    Objective:   Filed Vitals:   07/06/14 0837  BP: 142/66  Pulse: 54  Temp: 97.6 F (36.4 C)  Resp: 16    Physical Exam: Constitutional: Patient appears mal-nourished. No distress. HENT: Normocephalic, atraumatic, External right and left ear normal. Oropharynx is clear and moist.  Eyes: Conjunctivae and EOM are normal. PERRLA, no scleral icterus. Neck: Normal ROM. Neck supple. No JVD. No tracheal deviation. No thyromegaly. CVS: RRR, S1/S2 +, no murmurs, no gallops, no carotid bruit.  Pulmonary: Effort and breath sounds normal, no stridor, rhonchi, wheezes, rales.  Abdominal: Soft. BS +, no distension, tenderness, rebound or guarding.   Musculoskeletal: Normal range of motion. No edema and no tenderness.  Lymphadenopathy: No lymphadenopathy noted, cervical, inguinal or axillary Neuro: Alert. Normal reflexes, muscle tone coordination. No cranial nerve deficit. Skin: Skin is warm and dry. No rash noted. Not diaphoretic. No erythema. No pallor. Psychiatric: Normal mood and affect. Behavior, judgment, thought content normal.  Lab Results  Component Value Date   WBC 4.4 06/11/2014   HGB 9.2* 06/11/2014   HCT 26.7* 06/11/2014   MCV 102.7* 06/11/2014   PLT 47* 06/11/2014   Lab Results  Component Value Date   CREATININE 0.90 06/11/2014   BUN 8 06/11/2014   NA 136* 06/11/2014   K 3.3* 06/11/2014   CL 104 06/11/2014   CO2 21 06/11/2014    No results found for this basename: HGBA1C   Lipid Panel     Component  Value Date/Time   CHOL 69 04/22/2014 0530   TRIG 70 04/22/2014 0530   HDL 11* 04/22/2014 0530   CHOLHDL 6.3 04/22/2014 0530   VLDL 14 04/22/2014 0530   LDLCALC 44 04/22/2014 0530       Assessment and plan:   1. [place] (hepatocellular carcinoma) - Pt has undergone (TACE) procedure and he has a follow up MRI scheduled for this Monday 07/11/2014 and visit with Dr. Laurence Ferrari on Wed. 07/13/2104. I will also make a referral to Medical Oncology with Dr. Jana Hakim. - oxycodone (OXY-IR) 5 MG capsule; Take 1 capsule (5 mg total) by mouth every 4 (four) hours as needed.  Dispense: 30 capsule; Refill: 0 - Comprehensive metabolic panel  2. Abdominal pain, chronic, right upper quadrant - Likely related to [facility]. Will treat with Oxycodone. No acetaminophen.  - oxycodone (OXY-IR) 5 MG capsule; Take 1 capsule (5 mg total) by mouth every 4 (four) hours as needed.  Dispense: 30 capsule; Refill: 0  3. Elevated troponin - Pt had elevated troponin while hospitalized and It was felt to be secondary to demand ischemia. However, he was seen by Cardiology who recommended an out patient work-up in light of his known PAD. Pt has an appointment scheduled with  Cardiology for October.   4. Hypokalemia - Pt was hypokalemic on last labs. He reports having cramps in legs at night. Will check potassium levels. - Comprehensive metabolic panel  5. Vitamin D deficiency disease - Continue Drisdol  6. Unspecified deficiency anemia - Pt likely has nutritional anemia associated with ETOH use.   - folic acid (FOLVITE) 1 MG tablet; Take 1 tablet (1 mg total) by mouth daily.  Dispense: 30 tablet; Refill: 11 - CBC with Differential  8. Underweight - Will refer to nutrtion - Comprehensive metabolic panel  9. Thrombocytopenia -Associate with ETOH use  - CBC with Differential    Follow-up: 2 months  The patient was given clear instructions to go to ER or return to medical center if symptoms don't improve, worsen or new problems develop. The patient verbalized understanding. The patient was told to call to get lab results if they haven't heard anything in the next week.     This note has been created with Surveyor, quantity. Any transcriptional errors are unintentional.    [name],[name], MD [place], [place]   07/06/2014, 9:30 AM

## 2014-07-06 NOTE — Telephone Encounter (Signed)
Called to advised patient of appointment with Dr. Ermalene Searing with [facility] for 07/12/2014 @ 3:15pm, No answer will continue to try and contact. Patient Needs to keep appointment.

## 2014-07-07 LAB — CBC WITH DIFFERENTIAL/PLATELET
Basophils Absolute: 0 10*3/uL (ref 0.0–0.1)
Basophils Relative: 0 % (ref 0–1)
Eosinophils Absolute: 0 10*3/uL (ref 0.0–0.7)
Eosinophils Relative: 1 % (ref 0–5)
HCT: 38.3 % — ABNORMAL LOW (ref 39.0–52.0)
Hemoglobin: 13.3 g/dL (ref 13.0–17.0)
Lymphocytes Relative: 28 % (ref 12–46)
Lymphs Abs: 0.7 10*3/uL (ref 0.7–4.0)
MCH: 33.5 pg (ref 26.0–34.0)
MCHC: 34.7 g/dL (ref 30.0–36.0)
MCV: 96.5 fL (ref 78.0–100.0)
Monocytes Absolute: 0.4 10*3/uL (ref 0.1–1.0)
Monocytes Relative: 18 % — ABNORMAL HIGH (ref 3–12)
Neutro Abs: 1.3 10*3/uL — ABNORMAL LOW (ref 1.7–7.7)
Neutrophils Relative %: 53 % (ref 43–77)
Platelets: 51 10*3/uL — ABNORMAL LOW (ref 150–400)
RBC: 3.97 MIL/uL — ABNORMAL LOW (ref 4.22–5.81)
RDW: 14.4 % (ref 11.5–15.5)
WBC: 2.4 10*3/uL — ABNORMAL LOW (ref 4.0–10.5)

## 2014-07-07 IMAGING — XA IR EMBO TUMOR ORGAN ISCHEMIA INFARCT INC GUIDE ROADMAPPING
11 of 24 series · 11 of 24 positions shown · IV contrast (IODINE)
Comparison: none

CLINICAL DATA: 58-year-old male with HCV and ETOH cirrhosis and
newly diagnosed 3.2 cm hepatocellular carcinoma. He presents for the
first stage of a combined liver directed therapy. He will undergo
super selective SORIN to be followed by percutaneous microwave
embolization tomorrow.
TECHNIQUE: Informed consent was obtained from the patient following explanation
of the procedure, risks, benefits and alternatives. The patient
understands, agrees and consents for the procedure. All questions
were addressed. A time out was performed.

[Series 3: body 4 · 1 of 45 frames shown (1 of 11)]
[frame 7/45]
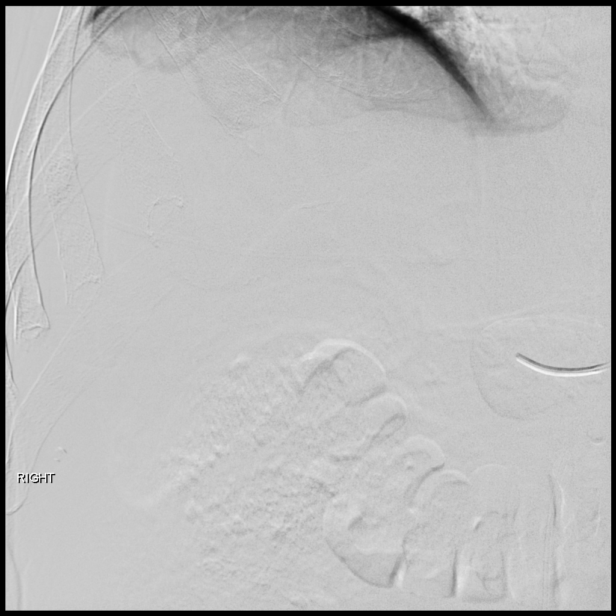

[Series 5: body 4 · 1 of 32 frames shown (2 of 11)]
[frame 5/32]
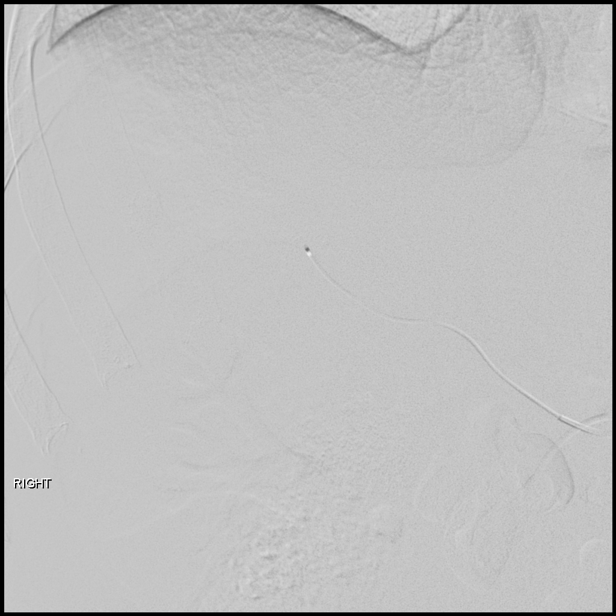

[Series 7: body 4 · 1 of 18 frames shown (3 of 11)]
[frame 3/18]
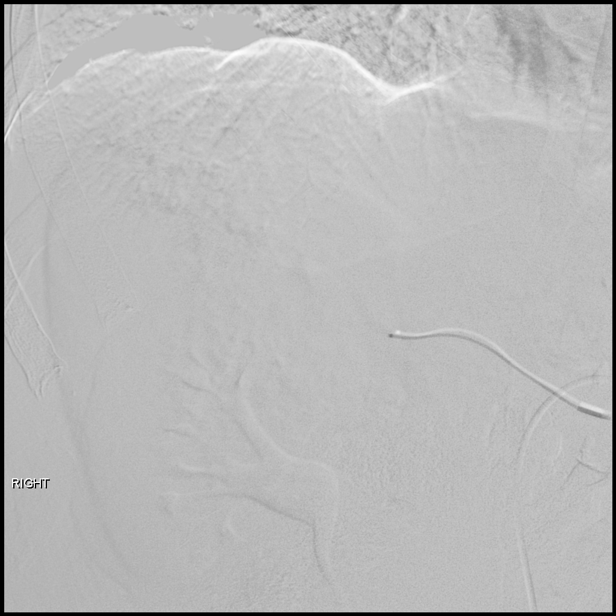

[Series 9: body 4 · 1 of 46 frames shown (4 of 11)]
[frame 39/46]
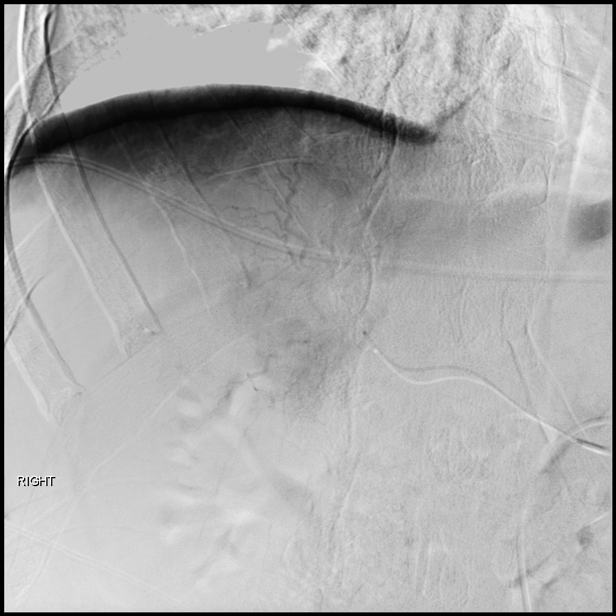

[Series 11: body 4 · 1 of 38 frames shown (5 of 11)]
[frame 33/38]
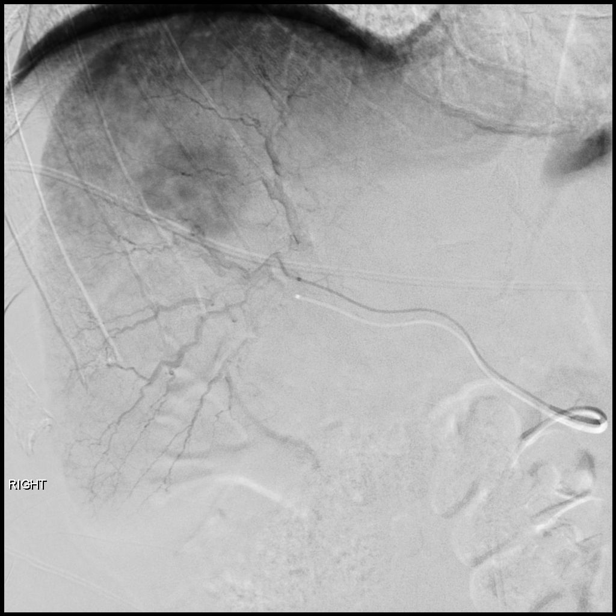

[Series 15: body 4 · 1 of 46 frames shown (6 of 11)]
[frame 40/46]
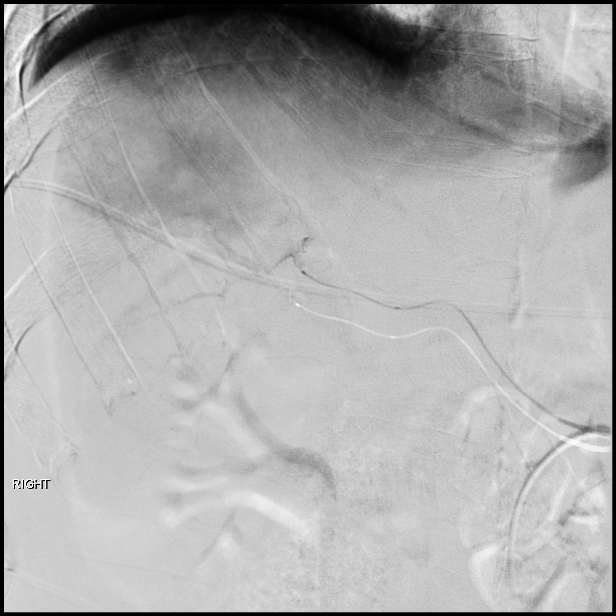

[Series 17: body 4 · 1 of 54 frames shown (7 of 11)]
[frame 46/54]
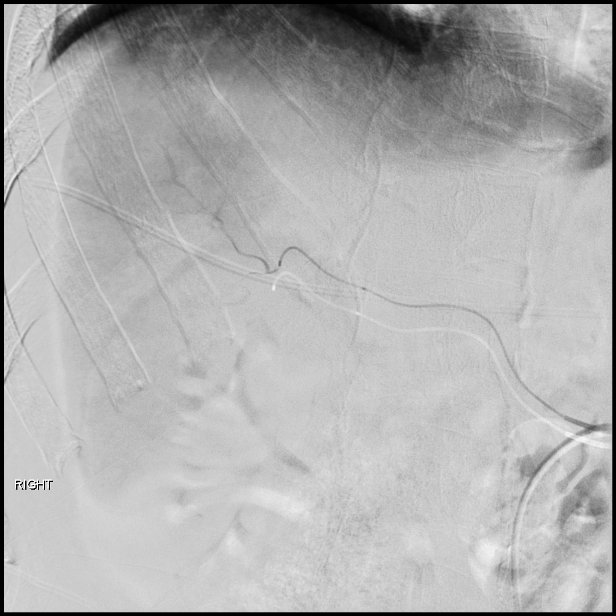

[Series 19: body 4 · 1 of 48 frames shown (8 of 11)]
[frame 41/48]
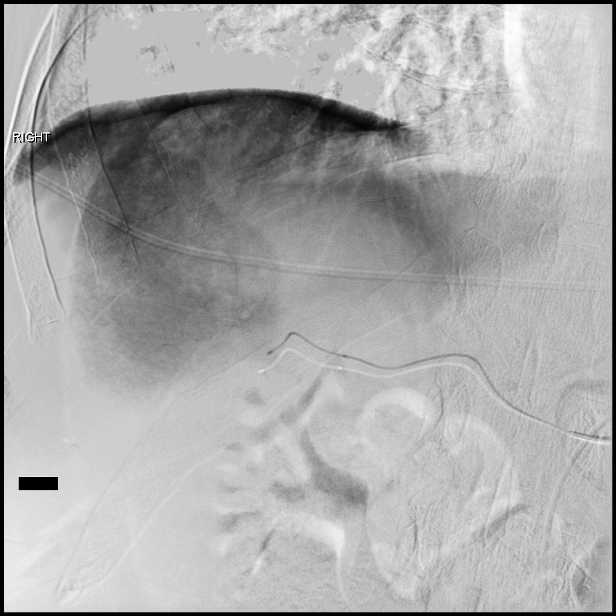

[Series 21: body 4 · 1 of 41 frames shown (9 of 11)]
[frame 36/41]
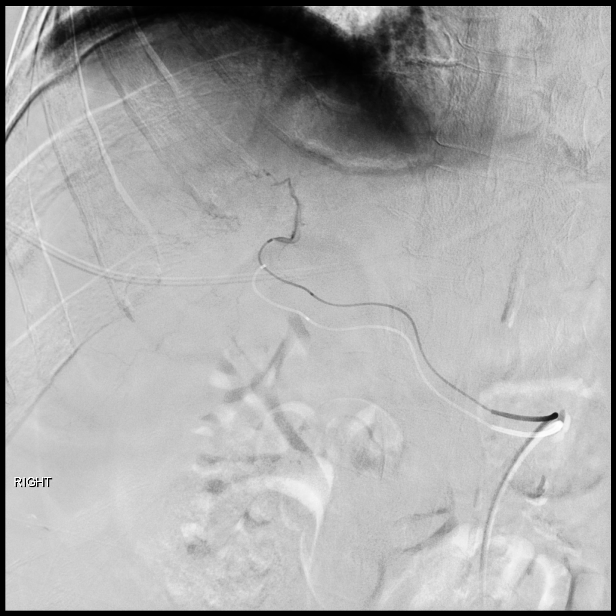

[Series 24: body 4 · 1 of 34 frames shown (10 of 11)]
[frame 29/34]
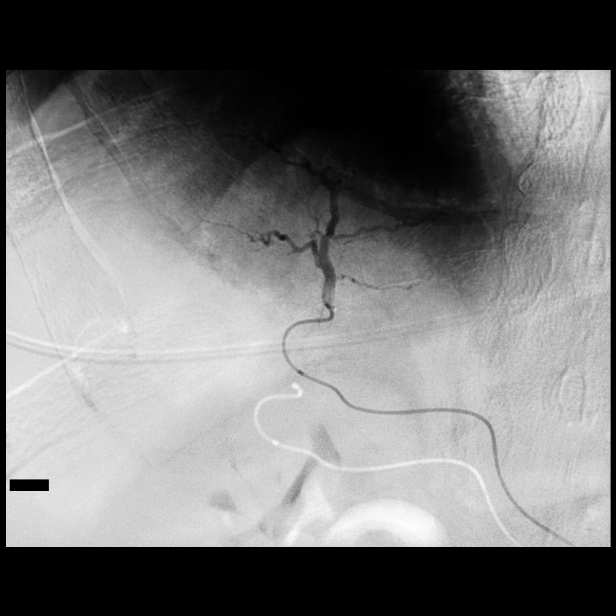

[Series 26: body 4 · 1 of 10 frames shown (11 of 11)]
[frame 9/10]
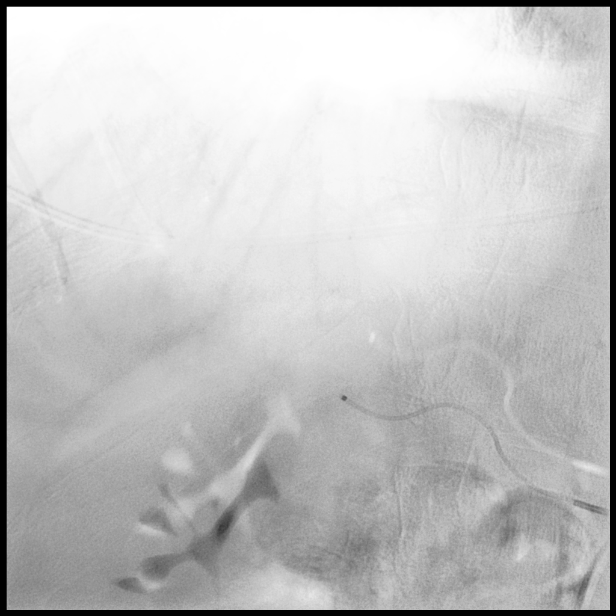

[11 of 24 positions shown; findings below may reference images not displayed]

EXAM:
IR EMBO TUMOR ORGAN ISCHEMIA INFARCT INC GUIDE ROADMAPPING;
ADDITIONAL ARTERIOGRAPHY; IR ULTRASOUND GUIDANCE VASC ACCESS RIGHT;
SELECTIVE VISCERAL ARTERIOGRAPHY

Date: 06/10/2014

PROCEDURE:
1. Ultrasound-guided puncture of the right common femoral artery
2. Catheterization of the celiac artery with arteriogram
3. Catheterization of the common hepatic artery with arteriogram
4. Catheterization of the right hepatic artery with arteriogram
5. Catheterization of the A5 artery with arteriogram
6. Catheterization of the joint trunk of the dorsal A8, A7 and A6
right hepatic arteries with arteriogram
7. Catheterization of the A7 artery with arteriogram
8. Catheterization of the A8 artery with arteriogram
9. Chemo embolization of the A7 artery
Interventional Radiologist: Yaniu Maciotor Next item post

ANESTHESIA/SEDATION:
Moderate (conscious) sedation was used. Seven mg Versed, 300 mcg
Fentanyl were administered intravenously. The patient's vital signs
were monitored continuously by radiology nursing throughout the
procedure.

Sedation Time: 150 minutes preoperative medications included
ciprofloxacin 400 mg IV and Flagyl 500 mg IV prophylactically within
1 hr of skin incision. Additionally, 8 mg Decadron and 8 mg Zofran
were administered intravenously in the preprocedural setting.

FLUOROSCOPY TIME:  30 minutes 12 seconds

CONTRAST:  231 mL OMNIPAQUE IOHEXOL 300 MG/ML  SOLN
Maximal barrier sterile technique utilized including caps, mask,
sterile gowns, sterile gloves, large sterile drape, hand hygiene,
and Betadine skin prep.

The right groin was interrogated with ultrasound. The right common
femoral artery was identified. There is heavy atherosclerotic plaque
in the common femoral artery. A relatively plaque free portion of
artery was identified. Image was obtained and stored. Local
anesthesia was attained by infiltration with 1% lidocaine. Under
real-time sonographic guidance, the common femoral artery was
punctured with a 21 gauge micropuncture needle. Image was obtained
and stored. With the aid of a transitional 5 French micro sheath, a
Bentson wire was advanced into the abdominal aorta. The micro sheath
was then exchanged for a working 5 French vascular sheath. A C2
Cobra catheter was advanced in the abdominal aorta and used to
select the celiac artery. Celiac arteriogram was performed. No
evidence of replaced right hepatic artery. Tumor blush is present in
the mid right hepatic artery consistent with the known
hepatocellular carcinoma.

The cobra catheter was advanced into the common hepatic artery in a
common hepatic arteriogram was performed the a for hepatic arteries
arise from the left hepatic artery. There appears to be an abnormal
branching pattern of the right hepatic artery. Tumor blush is again
identified in the mid right hepatic lobe. To better evaluate the
right hepatic arterial anatomy, a micro catheter was advanced into
the right hepatic artery in arteriography was performed in multiple
obliquities. There is a proximal common trunk supplying A5 artery.
Distal to this is a common trunk supplying the dorsal A8, A7 and A6
branches. The micro catheter was first advanced into the common
trunk of the A5 and ventral 8 arteries. An arteriogram was
performed. There is no definite tumor blush.

The micro catheter was next advanced into the right hepatic artery
into common trunk of the A8 and A6 arteries. Again, no definite
tumor blush is identified. The micro catheter was pulled back into
the right

Common trunk supplying the A7, A8 and A6 arteries an arteriogram was
performed. The tumor blush was again identified. Therefore, it is
likely arising from the A7 artery. The micro catheter was advanced
into the 87 artery and an arteriogram was performed. Tumor blush is
not definitively identified on the initial arteriogram. Therefore,
the micro catheter was again pulled back into the common trunk an
arteriogram was performed demonstrating the tumor blush. The micro
catheter therefore advanced into the a 8 artery and an arteriogram
was performed. The tumor is outlined as a negative on the
arteriogram suggesting the A8 artery flows around the tumor.

Therefore, the micro catheter was readvanced in the A7 artery. Chemo
embolization was then performed with 75 mg of doxorubicin labeled to
1 vial of 100- 300 micron LC beads. Approximately 70% of the dose
was administered be for flow began this low. No further
administration of the therapeutic agent was performed at that time.
The micro catheter was removed. A post

The catheter was removed. A limited right common femoral arteriogram
was performed confirming common femoral arterial puncture.
Embolization arteriogram was performed through the 5 French
catheter. There is persistent tumor blush as expected. Secondary to
the atherosclerotic disease in the right groin, hemostasis was
attained by manual pressure following removal of the sheath.
Overall, the patient tolerated the procedure very well.

COMPLICATIONS:
None immediate
IMPRESSION: Successful chemoembolization using drug-eluting beads. Approximately
70% of a dose of 75 mg of doxorubicin labeled to 100- 300 micron LC
beads was administered.

## 2014-07-11 ENCOUNTER — Ambulatory Visit (HOSPITAL_COMMUNITY)
Admission: RE | Admit: 2014-07-11 | Discharge: 2014-07-11 | Disposition: A | Discharge status: Home / Self Care | Payer: Medicaid Other | Source: Ambulatory Visit | Attending: Interventional Radiology | Admitting: Interventional Radiology

## 2014-07-11 DIAGNOSIS — R161 Splenomegaly, not elsewhere classified: Secondary | ICD-10-CM | POA: Insufficient documentation

## 2014-07-11 DIAGNOSIS — K8020 Calculus of gallbladder without cholecystitis without obstruction: Secondary | ICD-10-CM | POA: Diagnosis not present

## 2014-07-11 DIAGNOSIS — D490 Neoplasm of unspecified behavior of digestive system: Secondary | ICD-10-CM | POA: Diagnosis present

## 2014-07-11 DIAGNOSIS — K7460 Unspecified cirrhosis of liver: Secondary | ICD-10-CM | POA: Diagnosis not present

## 2014-07-11 DIAGNOSIS — C220 Liver cell carcinoma: Secondary | ICD-10-CM

## 2014-07-11 MED ORDER — GADOXETATE DISODIUM 0.25 MMOL/ML IV SOLN
10.0000 mL | Freq: Once | INTRAVENOUS | Status: AC | PRN
  Administered 2014-07-11: 7 mL via INTRAVENOUS

## 2014-07-12 ENCOUNTER — Ambulatory Visit: Payer: Self-pay | Admitting: Oncology

## 2014-07-12 ENCOUNTER — Other Ambulatory Visit: Payer: Self-pay

## 2014-07-13 ENCOUNTER — Other Ambulatory Visit: Payer: Self-pay | Admitting: Interventional Radiology

## 2014-07-13 ENCOUNTER — Ambulatory Visit
Admission: RE | Admit: 2014-07-13 | Discharge: 2014-07-13 | Disposition: A | Discharge status: Home / Self Care | Payer: Medicaid Other | Source: Ambulatory Visit | Attending: Radiology | Admitting: Radiology

## 2014-07-13 DIAGNOSIS — C220 Liver cell carcinoma: Secondary | ICD-10-CM

## 2014-07-17 ENCOUNTER — Other Ambulatory Visit: Payer: Self-pay | Admitting: Oncology

## 2014-07-27 MED FILL — Medication: Qty: 1 | Status: AC

## 2014-08-12 ENCOUNTER — Telehealth: Payer: Self-pay | Admitting: Internal Medicine

## 2014-08-12 NOTE — Telephone Encounter (Signed)
Faxed pt office note to [facility] [place]

## 2014-09-01 ENCOUNTER — Telehealth: Payer: Self-pay | Admitting: Radiology

## 2014-09-01 NOTE — Telephone Encounter (Signed)
Patient called concerning follow up appointment with Dr Laurence Ferrari.  This will be scheduled for late October 2015.    Patient concerned about liver disease.  Encouraged to contact [facility] to Reschedule his consultation.  For immediate concerns, patient to follow up with primary care physician.    Fredericka Bottcher Riki Rusk, [place] 09/01/2014 10:32 AM

## 2014-09-08 ENCOUNTER — Encounter: Payer: Self-pay | Admitting: Internal Medicine

## 2014-09-16 ENCOUNTER — Ambulatory Visit: Payer: Medicaid Other | Admitting: Vascular Surgery

## 2014-09-16 ENCOUNTER — Encounter (HOSPITAL_COMMUNITY): Payer: Medicaid Other

## 2014-09-21 ENCOUNTER — Other Ambulatory Visit (HOSPITAL_COMMUNITY): Payer: Self-pay | Admitting: Interventional Radiology

## 2014-09-21 ENCOUNTER — Other Ambulatory Visit: Payer: Self-pay | Admitting: Radiology

## 2014-09-21 ENCOUNTER — Encounter: Payer: Self-pay | Admitting: Radiology

## 2014-09-21 DIAGNOSIS — C220 Liver cell carcinoma: Secondary | ICD-10-CM

## 2014-09-22 ENCOUNTER — Other Ambulatory Visit: Payer: Self-pay | Admitting: Emergency Medicine

## 2014-09-22 ENCOUNTER — Encounter: Payer: Self-pay | Admitting: Radiology

## 2014-09-23 ENCOUNTER — Telehealth: Payer: Self-pay

## 2014-09-23 NOTE — Telephone Encounter (Signed)
Attempted to return call; no answer.  Left v.m. to call office.

## 2014-09-23 NOTE — Telephone Encounter (Signed)
Message copied by Denman George on Fri Sep 23, 2014 11:56 AM ------      Message from: Merleen Nicely      Created: Thu Sep 22, 2014  9:38 AM      Regarding: questions about surgery procedure       Contact: [phone]       Jeffrey Snow wants to ask you some questions about his procedure. One of his main questions is will he be able to come home the same day as his procedure.            Please call.            Ebony Hail ------

## 2014-09-26 NOTE — Telephone Encounter (Signed)
Able to contact pt. Per phone.  Had questions about the "spot" on his liver.  Reported he has an appt. For an MRI and to see Dr. Laurence Ferrari next week.  Advised he would need to have Dr. Laurence Ferrari address questions about his liver.  Verb. Understanding.

## 2014-09-27 LAB — COMPREHENSIVE METABOLIC PANEL
ALT: 41 U/L (ref 0–53)
AST: 56 U/L — ABNORMAL HIGH (ref 0–37)
Albumin: 3 g/dL — ABNORMAL LOW (ref 3.5–5.2)
Alkaline Phosphatase: 127 U/L — ABNORMAL HIGH (ref 39–117)
BUN: 17 mg/dL (ref 6–23)
CO2: 32 mEq/L (ref 19–32)
Calcium: 8.7 mg/dL (ref 8.4–10.5)
Chloride: 100 mEq/L (ref 96–112)
Creat: 0.77 mg/dL (ref 0.50–1.35)
Glucose, Bld: 139 mg/dL — ABNORMAL HIGH (ref 70–99)
Potassium: 3.5 mEq/L (ref 3.5–5.3)
Sodium: 134 mEq/L — ABNORMAL LOW (ref 135–145)
Total Bilirubin: 1.8 mg/dL — ABNORMAL HIGH (ref 0.2–1.2)
Total Protein: 6.1 g/dL (ref 6.0–8.3)

## 2014-09-27 LAB — CBC
HCT: 35.9 % — ABNORMAL LOW (ref 39.0–52.0)
Hemoglobin: 11.8 g/dL — ABNORMAL LOW (ref 13.0–17.0)
MCH: 33.2 pg (ref 26.0–34.0)
MCHC: 32.9 g/dL (ref 30.0–36.0)
MCV: 101.1 fL — ABNORMAL HIGH (ref 78.0–100.0)
Platelets: 70 10*3/uL — ABNORMAL LOW (ref 150–400)
RBC: 3.55 MIL/uL — ABNORMAL LOW (ref 4.22–5.81)
RDW: 16.3 % — ABNORMAL HIGH (ref 11.5–15.5)
WBC: 4.1 10*3/uL (ref 4.0–10.5)

## 2014-09-27 LAB — PROTIME-INR
INR: 1.5 — ABNORMAL HIGH (ref <1.50)
Prothrombin Time: 18.1 seconds — ABNORMAL HIGH (ref 11.6–15.2)

## 2014-10-03 ENCOUNTER — Ambulatory Visit (HOSPITAL_COMMUNITY)
Admission: RE | Admit: 2014-10-03 | Discharge: 2014-10-03 | Disposition: A | Discharge status: Home / Self Care | Payer: Medicaid Other | Source: Ambulatory Visit | Attending: Interventional Radiology | Admitting: Interventional Radiology

## 2014-10-03 DIAGNOSIS — C220 Liver cell carcinoma: Secondary | ICD-10-CM | POA: Diagnosis present

## 2014-10-03 DIAGNOSIS — K760 Fatty (change of) liver, not elsewhere classified: Secondary | ICD-10-CM | POA: Insufficient documentation

## 2014-10-03 DIAGNOSIS — K8020 Calculus of gallbladder without cholecystitis without obstruction: Secondary | ICD-10-CM | POA: Diagnosis not present

## 2014-10-03 DIAGNOSIS — K449 Diaphragmatic hernia without obstruction or gangrene: Secondary | ICD-10-CM | POA: Diagnosis not present

## 2014-10-03 DIAGNOSIS — I8510 Secondary esophageal varices without bleeding: Secondary | ICD-10-CM | POA: Insufficient documentation

## 2014-10-03 DIAGNOSIS — J90 Pleural effusion, not elsewhere classified: Secondary | ICD-10-CM | POA: Insufficient documentation

## 2014-10-03 DIAGNOSIS — R161 Splenomegaly, not elsewhere classified: Secondary | ICD-10-CM | POA: Insufficient documentation

## 2014-10-03 DIAGNOSIS — R188 Other ascites: Secondary | ICD-10-CM | POA: Diagnosis not present

## 2014-10-03 DIAGNOSIS — K766 Portal hypertension: Secondary | ICD-10-CM | POA: Insufficient documentation

## 2014-10-03 DIAGNOSIS — K7460 Unspecified cirrhosis of liver: Secondary | ICD-10-CM | POA: Insufficient documentation

## 2014-10-03 MED ORDER — GADOXETATE DISODIUM 0.25 MMOL/ML IV SOLN
6.0000 mL | Freq: Once | INTRAVENOUS | Status: AC | PRN
  Administered 2014-10-03: 6 mL via INTRAVENOUS

## 2014-10-04 ENCOUNTER — Ambulatory Visit
Admission: RE | Admit: 2014-10-04 | Discharge: 2014-10-04 | Disposition: A | Discharge status: Home / Self Care | Payer: Medicaid Other | Source: Ambulatory Visit | Attending: Interventional Radiology | Admitting: Interventional Radiology

## 2014-10-04 DIAGNOSIS — C220 Liver cell carcinoma: Secondary | ICD-10-CM

## 2014-10-04 HISTORY — PX: IR GENERIC HISTORICAL: IMG1180011

## 2014-10-04 NOTE — Progress Notes (Signed)
Appetite:  Fair-good.  Weight stable at present.  Denies nausea, vomiting or diarrhea.  Denies pain associated w/ procedure.    Follow up appointment [facility] on 10/05/2014.  Able to complete ADL's without assistance.  Lives alone.  Daryn Pisani Riki Rusk, RN 10/04/2014 1:34 PM

## 2014-10-05 NOTE — Progress Notes (Signed)
Chief Complaint: Chief Complaint  Patient presents with  . Follow-up    4 mo follow up DEB-TACE    Referring Physician(s): Samad Thon  History of Present Illness: Jeffrey Snow is a 58 y.o. male with EtOH and HCV cirrhosis complicated by 3.4 cm HCC and large volume ascites.  Treated with combined DEB-TACE and microwave ablation on 6/25 and 06/10/14.  First 3 month post treatment scan showed no residual disease.  Pt now returns for scheduled 6 month follow up evaluation.   Since his last visit, Jeffrey Snow developed worsening ascites which was becoming symptomatic.  He was seen at the [facility] here in [place] and was started on aldactone and lasix.  Today he is pleased to report his ascites has nearly completely resolved and he is no longer symptomatic.   He is essentially asymptomatic.  His appetite is good although he notes he can't seem to put on weight.  He continues to abstain from alcohol.  No fever, chills, abdominal pain, night sweats or other symptoms.   Past Medical History  Diagnosis Date  . Depression   . Anxiety   . Panic attack   . Hypertension   . Hepatitis C   . Claudication of left lower extremity m-1  . Abnormal MRI of abdomen, liver  04/21/2014  . Cirrhosis, alcoholic 08/21/7590  . Liver mass   . [place] (hepatocellular carcinoma)     Past Surgical History  Procedure Laterality Date  . Cosmetic surgery      facial reconstructive surgery  . Aortogram      Allergies: Review of patient's allergies indicates no known allergies.  Medications: Prior to Admission medications   Medication Sig Start Date End Date Taking? Authorizing Provider  ALPRAZolam Duanne Moron) 0.5 MG tablet Take 0.5 mg by mouth 3 (three) times daily.    Historical Provider, MD  aspirin EC 81 MG tablet Take 81 mg by mouth daily.    Historical Provider, MD  benazepril (LOTENSIN) 40 MG tablet Take 40 mg by mouth every morning.    Historical Provider, MD  docusate sodium 100 MG  CAPS Take 100 mg by mouth 2 (two) times daily. 06/11/14   Ascencion Dike, PA-C  ENSURE PLUS (ENSURE PLUS) LIQD Take 237 mLs by mouth 4 (four) times daily.    Historical Provider, MD  ergocalciferol (DRISDOL) 50000 UNITS capsule Take 1 capsule (50,000 Units total) by mouth once a week. 03/31/14   Dorena Dew, FNP  FLUoxetine (PROZAC) 20 MG capsule Take 20 mg by mouth every morning.     Historical Provider, MD  folic acid (FOLVITE) 1 MG tablet Take 1 tablet (1 mg total) by mouth daily. 07/06/14   Leana Gamer, MD  hydrochlorothiazide (HYDRODIURIL) 25 MG tablet Take 25 mg by mouth every morning.    Historical Provider, MD  hydrocortisone (ANUSOL-HC) 2.5 % rectal cream Place 1 application rectally 2 (two) times daily as needed for hemorrhoids or itching.    Historical Provider, MD  ibuprofen (ADVIL,MOTRIN) 200 MG tablet Take 400 mg by mouth 3 (three) times daily as needed (Pain).    Historical Provider, MD  oxycodone (OXY-IR) 5 MG capsule Take 1 capsule (5 mg total) by mouth every 4 (four) hours as needed. 07/06/14   Leana Gamer, MD  Skin Protectants, Misc. (EUCERIN) cream Apply 1 application topically as needed for dry skin (Red spots).    Historical Provider, MD  thiamine 100 MG tablet Take 1 tablet (100 mg total) by mouth daily.  05/02/14   Modena Jansky, MD  zolpidem (AMBIEN) 10 MG tablet Take 10 mg by mouth at bedtime.     Historical Provider, MD    Family History  Problem Relation Age of Onset  . Diabetes Father   . Diabetes Brother   . Diabetes Brother     History   Social History  . Marital Status: Married    Spouse Name: N/A    Number of Children: 3  . Years of Education: N/A   Occupational History  .  [employer]   Social History Main Topics  . Smoking status: Former Smoker -- 0.50 packs/day    Quit date: 08/21/2011  . Smokeless tobacco: Never Used  . Alcohol Use: 3.6 oz/week    6 Cans of beer per week     Comment: Occasional beer on the weekends  . Drug  Use: No     Comment: abused cocaine in the 80's  . Sexual Activity: Not on file   Other Topics Concern  . Not on file   Social History Narrative   Single, never married.  Lives alone.  3 daughters, 16 grandchildren who are not very involved in the patient's care.  Quit smoking and heavy drinking 3.5 years ago.     ECOG Status: 0 - Asymptomatic  Review of Systems: A 12 point ROS discussed and pertinent positives are indicated in the HPI above.  All other systems are negative.  Review of Systems  Vital Signs: BP 134/61  Pulse 60  Temp(Src) 97.8 F (36.6 C) (Oral)  Resp 14  Ht 5\' 8"  (1.727 m)  Wt 137 lb (62.143 kg)  BMI 20.84 kg/m2  SpO2 100%  Physical Exam  Constitutional: He is oriented to person, place, and time. He appears well-developed and well-nourished.  HENT:  Head: Normocephalic and atraumatic.  Eyes: No scleral icterus.  Cardiovascular: Normal rate and regular rhythm.   Pulmonary/Chest: Effort normal.  Abdominal: Soft. He exhibits no distension.  Neurological: He is alert and oriented to person, place, and time.  Skin: Skin is warm and dry.  Psychiatric: He has a normal mood and affect. His behavior is normal.    Imaging: Mr Abdomen W Wo Contrast  10/03/2014   CLINICAL DATA:  HCC, 4 month follow-up TACE, alcohol abuse  EXAM: MRI ABDOMEN WITHOUT AND WITH CONTRAST  TECHNIQUE: Multiplanar multisequence MR imaging of the abdomen was performed both before and after the administration of intravenous contrast.  CONTRAST:  6 mL Eovist IV  COMPARISON:  07/11/2014  FINDINGS: Motion degraded images.  Lower chest: Small left pleural effusion. Lung bases are otherwise clear.  Hepatobiliary: Cirrhotic liver. Mild hepatic steatosis. 4.2 x 4.1 cm lesion in the posterior segment right hepatic lobe (series 502/image 27), corresponding to known hepatocellular carcinoma status post ablation, with intrinsic T1 hyperintensity but no definite enhancement on post contrast subtraction  imaging. No new/suspicious hepatic lesions.  Layering gallstones, without associated inflammatory changes. No intrahepatic or extrahepatic ductal dilatation.  Pancreas: Within normal limits.  Spleen: Enlarged, measuring 16.6 cm in maximal craniocaudal dimension.  Adrenals/Urinary Tract: Adrenal glands are unremarkable.  Kidneys are within normal limits.  No hydronephrosis.  Stomach/Bowel: Stomach is notable for a small hiatal hernia.  Visualized bowel is poorly evaluated but grossly unremarkable.  Vascular/Lymphatic: No evidence of bowel aortic aneurysm.  Recanalized periumbilical vein. Gastroesophageal varices portal vein is patent.  Small upper abdominal lymph node, including an 11 mm short axis peripancreatic node (series 503/image 57), likely reactive.  Other: Large  volume abdominal ascites.  Musculoskeletal: No focal osseous lesions.  IMPRESSION: 4.2 cm lesion in the posterior segment right hepatic lobe, corresponding to known hepatocellular carcinoma status post ablation, without definite enhancement on postcontrast subtraction imaging.  Cirrhosis with stigmata of portal hypertension including gastroesophageal varices and splenomegaly. Portal vein is patent.  Large volume abdominal ascites.  Small left pleural effusion.  Cholelithiasis.   Electronically Signed   By: Julian Hy M.D.   On: 10/03/2014 13:05    Labs:  CBC:  Recent Labs  06/10/14 0415 06/11/14 0445 07/06/14 0943 09/26/14 0705  WBC 4.2 4.4 2.4* 4.1  HGB 8.8* 9.2* 13.3 11.8*  HCT 26.0* 26.7* 38.3* 35.9*  PLT 43* 47* 51* 70*    COAGS:  Recent Labs  04/22/14 0951 04/28/14 1356 04/28/14 2214 05/17/14 1015 06/03/14 1018 06/09/14 0918 09/26/14 0705  INR 1.77* 1.85* 1.96* 1.62* 1.36 1.50* 1.50*  APTT 33 37 40*  --   --  32  --     BMP:  Recent Labs  04/29/14 0700  06/09/14 0918 06/10/14 0415 06/11/14 0445 07/06/14 0943 09/26/14 0705  NA 131*  < > 136* 137 136* 134* 134*  K 4.5  < > 3.9 3.2* 3.3* 3.6 3.5    CL 100  < > 101 105 104 99 100  CO2 22  < > 22 21 21 28  32  GLUCOSE 86  < > 92 136* 98 116* 139*  BUN 14  < > 7 9 8 7 17   CALCIUM 8.3*  < > 8.5 8.2* 8.1* 8.8 8.7  CREATININE 0.95  < > 0.80 0.81 0.90 0.66 0.77  GFRNONAA 89*  --  >90 >90 >90  --   --   GFRAA >90  --  >90 >90 >90  --   --   < > = values in this interval not displayed.  LIVER FUNCTION TESTS:  Recent Labs  06/10/14 0415 06/11/14 0445 07/06/14 0943 09/26/14 0705  BILITOT 1.2 1.5* 2.3* 1.8*  AST 45* 199* 71* 56*  ALT 32 72* 39 41  ALKPHOS 77 68 101 127*  PROT 5.2* 5.3* 6.4 6.1  ALBUMIN 2.3* 2.5* 3.2* 3.0*    TUMOR MARKERS:  Recent Labs  04/21/14 1700  AFPTM 75.0*    Assessment and Plan:  [place] - Continues to do well without complication - No evidence of residual or recurrent disease s/p combined DEB-TACE and MWA - Continue Q3 month surveillance with Eovist MRI - Include AFP with labs at next visit  Ascites - Significant improvement following initiation of diuretics - Continue to follow with [facility] liver    Signed,  Criselda Peaches, MD    I spent a total of 30 minutes face to face in clinical consultation, greater than 50% of which was counseling/coordinating care for treated hepatocellular carcinoma and ascites.  SignedCriselda Peaches 10/05/2014, 5:40 PM

## 2014-10-07 ENCOUNTER — Ambulatory Visit: Payer: Self-pay | Admitting: Family

## 2014-10-07 ENCOUNTER — Ambulatory Visit: Payer: Medicaid Other | Admitting: Vascular Surgery

## 2014-10-07 ENCOUNTER — Encounter (HOSPITAL_COMMUNITY): Payer: Medicaid Other

## 2014-11-15 ENCOUNTER — Encounter (HOSPITAL_COMMUNITY): Payer: Self-pay | Admitting: *Deleted

## 2014-11-15 ENCOUNTER — Emergency Department (HOSPITAL_COMMUNITY)
Admission: EM | Admit: 2014-11-15 | Discharge: 2014-11-15 | Disposition: A | Discharge status: Home / Self Care | Payer: Medicaid Other | Attending: Emergency Medicine | Admitting: Emergency Medicine

## 2014-11-15 DIAGNOSIS — Z7982 Long term (current) use of aspirin: Secondary | ICD-10-CM | POA: Diagnosis not present

## 2014-11-15 DIAGNOSIS — R7989 Other specified abnormal findings of blood chemistry: Secondary | ICD-10-CM | POA: Diagnosis present

## 2014-11-15 DIAGNOSIS — R350 Frequency of micturition: Secondary | ICD-10-CM | POA: Diagnosis not present

## 2014-11-15 DIAGNOSIS — Z79899 Other long term (current) drug therapy: Secondary | ICD-10-CM | POA: Diagnosis not present

## 2014-11-15 DIAGNOSIS — F329 Major depressive disorder, single episode, unspecified: Secondary | ICD-10-CM | POA: Diagnosis not present

## 2014-11-15 DIAGNOSIS — E876 Hypokalemia: Secondary | ICD-10-CM | POA: Diagnosis not present

## 2014-11-15 DIAGNOSIS — I10 Essential (primary) hypertension: Secondary | ICD-10-CM | POA: Insufficient documentation

## 2014-11-15 DIAGNOSIS — Z8619 Personal history of other infectious and parasitic diseases: Secondary | ICD-10-CM | POA: Diagnosis not present

## 2014-11-15 DIAGNOSIS — F410 Panic disorder [episodic paroxysmal anxiety] without agoraphobia: Secondary | ICD-10-CM | POA: Diagnosis not present

## 2014-11-15 DIAGNOSIS — Z87891 Personal history of nicotine dependence: Secondary | ICD-10-CM | POA: Insufficient documentation

## 2014-11-15 DIAGNOSIS — Z8719 Personal history of other diseases of the digestive system: Secondary | ICD-10-CM | POA: Diagnosis not present

## 2014-11-15 DIAGNOSIS — Z8505 Personal history of malignant neoplasm of liver: Secondary | ICD-10-CM | POA: Insufficient documentation

## 2014-11-15 LAB — CBC
HCT: 38.9 % — ABNORMAL LOW (ref 39.0–52.0)
Hemoglobin: 13.3 g/dL (ref 13.0–17.0)
MCH: 33.2 pg (ref 26.0–34.0)
MCHC: 34.2 g/dL (ref 30.0–36.0)
MCV: 97 fL (ref 78.0–100.0)
Platelets: 74 10*3/uL — ABNORMAL LOW (ref 150–400)
RBC: 4.01 MIL/uL — ABNORMAL LOW (ref 4.22–5.81)
RDW: 13.4 % (ref 11.5–15.5)
WBC: 3.9 10*3/uL — ABNORMAL LOW (ref 4.0–10.5)

## 2014-11-15 LAB — BASIC METABOLIC PANEL
Anion gap: 13 (ref 5–15)
BUN: 8 mg/dL (ref 6–23)
CO2: 26 mEq/L (ref 19–32)
Calcium: 9.3 mg/dL (ref 8.4–10.5)
Chloride: 100 mEq/L (ref 96–112)
Creatinine, Ser: 0.86 mg/dL (ref 0.50–1.35)
GFR calc Af Amer: >90 mL/min (ref >90)
GFR calc non Af Amer: >90 mL/min (ref >90)
Glucose, Bld: 102 mg/dL — ABNORMAL HIGH (ref 70–99)
Potassium: 2.6 mEq/L — CL (ref 3.7–5.3)
Sodium: 139 mEq/L (ref 137–147)

## 2014-11-15 MED ORDER — POTASSIUM CHLORIDE 10 MEQ/100ML IV SOLN
10.0000 meq | Freq: Once | INTRAVENOUS | Status: AC
  Administered 2014-11-15: 10 meq via INTRAVENOUS
  Filled 2014-11-15: qty 100

## 2014-11-15 MED ORDER — POTASSIUM CHLORIDE CRYS ER 20 MEQ PO TBCR
40.0000 meq | EXTENDED_RELEASE_TABLET | Freq: Once | ORAL | Status: AC
  Administered 2014-11-15: 40 meq via ORAL
  Filled 2014-11-15: qty 2

## 2014-11-15 MED ORDER — POTASSIUM CHLORIDE CRYS ER 20 MEQ PO TBCR
40.0000 meq | EXTENDED_RELEASE_TABLET | Freq: Every day | ORAL | Status: DC
Start: 2014-11-15 — End: 2014-12-17

## 2014-11-15 NOTE — ED Notes (Signed)
Denies any c/o distress at present

## 2014-11-15 NOTE — Discharge Instructions (Signed)
Hypokalemia °Hypokalemia means that the amount of potassium in the blood is lower than normal. Potassium is a chemical, called an electrolyte, that helps regulate the amount of fluid in the body. It also stimulates muscle contraction and helps nerves function properly. Most of the body's potassium is inside of cells, and only a very small amount is in the blood. Because the amount in the blood is so small, minor changes can be life-threatening. °CAUSES °· Antibiotics. °· Diarrhea or vomiting. °· Using laxatives too much, which can cause diarrhea. °· Chronic kidney disease. °· Water pills (diuretics). °· Eating disorders (bulimia). °· Low magnesium level. °· Sweating a lot. °SIGNS AND SYMPTOMS °· Weakness. °· Constipation. °· Fatigue. °· Muscle cramps. °· Mental confusion. °· Skipped heartbeats or irregular heartbeat (palpitations). °· Tingling or numbness. °DIAGNOSIS  °Your health care provider can diagnose hypokalemia with blood tests. In addition to checking your potassium level, your health care provider may also check other lab tests. °TREATMENT °Hypokalemia can be treated with potassium supplements taken by mouth or adjustments in your current medicines. If your potassium level is very low, you may need to get potassium through a vein (IV) and be monitored in the hospital. A diet high in potassium is also helpful. Foods high in potassium are: °· Nuts, such as peanuts and pistachios. °· Seeds, such as sunflower seeds and pumpkin seeds. °· Peas, lentils, and lima beans. °· Whole grain and bran cereals and breads. °· Fresh fruit and vegetables, such as apricots, avocado, bananas, cantaloupe, kiwi, oranges, tomatoes, asparagus, and potatoes. °· Orange and tomato juices. °· Red meats. °· Fruit yogurt. °HOME CARE INSTRUCTIONS °· Take all medicines as prescribed by your health care provider. °· Maintain a healthy diet by including nutritious food, such as fruits, vegetables, nuts, whole grains, and lean meats. °· If  you are taking a laxative, be sure to follow the directions on the label. °SEEK MEDICAL CARE IF: °· Your weakness gets worse. °· You feel your heart pounding or racing. °· You are vomiting or having diarrhea. °· You are diabetic and having trouble keeping your blood glucose in the normal range. °SEEK IMMEDIATE MEDICAL CARE IF: °· You have chest pain, shortness of breath, or dizziness. °· You are vomiting or having diarrhea for more than 2 days. °· You faint. °MAKE SURE YOU:  °· Understand these instructions. °· Will watch your condition. °· Will get help right away if you are not doing well or get worse. °Document Released: 12/02/2005 Document Revised: 09/22/2013 Document Reviewed: 06/04/2013 °Exit[employer]® Patient Information ©2015 [employer]. This information is not intended to replace advice given to you by your health care provider. Make sure you discuss any questions you have with your health care provider. ° °Potassium Content of Foods °Potassium is a mineral found in many foods and drinks. It helps keep fluids and minerals balanced in your body and affects how steadily your heart beats. Potassium also helps control your blood pressure and keep your muscles and nervous system healthy. °Certain health conditions and medicines may change the balance of potassium in your body. When this happens, you can help balance your level of potassium through the foods that you do or do not eat. Your health care provider or dietitian may recommend an amount of potassium that you should have each day. The following lists of foods provide the amount of potassium (in parentheses) per serving in each item. °HIGH IN POTASSIUM  °The following foods and beverages have 200 mg or more of potassium per   serving: °· Apricots, 2 raw or 5 dry (200 mg). °· Artichoke, 1 medium (345 mg). °· Avocado, raw,  ¼ each (245 mg). °· Banana, 1 medium (425 mg). °· Beans, lima, or baked beans, canned, ½ cup (280 mg). °· Beans, white, canned, ½ cup (595  mg). °· Beef roast, 3 oz (320 mg). °· Beef, ground, 3 oz (270 mg). °· Beets, raw or cooked, ½ cup (260 mg). °· Bran muffin, 2 oz (300 mg). °· Broccoli, ½ cup (230 mg). °· Brussels sprouts, ½ cup (250 mg). °· Cantaloupe, ½ cup (215 mg). °· Cereal, 100% bran, ½ cup (200-400 mg). °· Cheeseburger, single, fast food, 1 each (225-400 mg). °· Chicken, 3 oz (220 mg). °· Clams, canned, 3 oz (535 mg). °· Crab, 3 oz (225 mg). °· Dates, 5 each (270 mg). °· Dried beans and peas, ½ cup (300-475 mg). °· Figs, dried, 2 each (260 mg). °· Fish: halibut, tuna, cod, snapper, 3 oz (480 mg). °· Fish: salmon, haddock, swordfish, perch, 3 oz (300 mg). °· Fish, tuna, canned 3 oz (200 mg). °· French fries, fast food, 3 oz (470 mg). °· Granola with fruit and nuts, ½ cup (200 mg). °· Grapefruit juice, ½ cup (200 mg). °· Greens, beet, ½ cup (655 mg). °· Honeydew melon, ½ cup (200 mg). °· Kale, raw, 1 cup (300 mg). °· Kiwi, 1 medium (240 mg). °· Kohlrabi, rutabaga, parsnips, ½ cup (280 mg). °· Lentils, ½ cup (365 mg). °· Mango, 1 each (325 mg). °· Milk, chocolate, 1 cup (420 mg). °· Milk: nonfat, low-fat, whole, buttermilk, 1 cup (350-380 mg). °· Molasses, 1 Tbsp (295 mg). °· Mushrooms, ½ cup (280) mg. °· Nectarine, 1 each (275 mg). °· Nuts: almonds, peanuts, hazelnuts, [place], cashew, mixed, 1 oz (200 mg). °· Nuts, pistachios, 1 oz (295 mg). °· Orange, 1 each (240 mg). °· Orange juice, ½ cup (235 mg). °· Papaya, medium, ½ fruit (390 mg). °· Peanut butter, chunky, 2 Tbsp (240 mg). °· Peanut butter, smooth, 2 Tbsp (210 mg). °· Pear, 1 medium (200 mg). °· Pomegranate, 1 whole (400 mg). °· Pomegranate juice, ½ cup (215 mg). °· Pork, 3 oz (350 mg). °· Potato chips, salted, 1 oz (465 mg). °· Potato, baked with skin, 1 medium (925 mg). °· Potatoes, boiled, ½ cup (255 mg). °· Potatoes, mashed, ½ cup (330 mg). °· Prune juice, ½ cup (370 mg). °· Prunes, 5 each (305 mg). °· Pudding, chocolate, ½ cup (230 mg). °· Pumpkin, canned, ½ cup (250  mg). °· Raisins, seedless, ¼ cup (270 mg). °· Seeds, sunflower or pumpkin, 1 oz (240 mg). °· Soy milk, 1 cup (300 mg). °· Spinach, ½ cup (420 mg). °· Spinach, canned, ½ cup (370 mg). °· Sweet potato, baked with skin, 1 medium (450 mg). °· Swiss chard, ½ cup (480 mg). °· Tomato or vegetable juice, ½ cup (275 mg). °· Tomato sauce or puree, ½ cup (400-550 mg). °· Tomato, raw, 1 medium (290 mg). °· Tomatoes, canned, ½ cup (200-300 mg). °· Turkey, 3 oz (250 mg). °· Wheat germ, 1 oz (250 mg). °· Winter squash, ½ cup (250 mg). °· Yogurt, plain or fruited, 6 oz (260-435 mg). °· Zucchini, ½ cup (220 mg). °MODERATE IN POTASSIUM °The following foods and beverages have 50-200 mg of potassium per serving: °· Apple, 1 each (150 mg). °· Apple juice, ½ cup (150 mg). °· Applesauce, ½ cup (90 mg). °· Apricot nectar, ½ cup (140 mg). °· Asparagus, small spears, ½ cup or 6   spears (155 mg). °· Bagel, cinnamon raisin, 1 each (130 mg). °· Bagel, egg or plain, 4 in., 1 each (70 mg). °· Beans, green, ½ cup (90 mg). °· Beans, yellow, ½ cup (190 mg). °· Beer, regular, 12 oz (100 mg). °· Beets, canned, ½ cup (125 mg). °· Blackberries, ½ cup (115 mg). °· Blueberries, ½ cup (60 mg). °· Bread, whole wheat, 1 slice (70 mg). °· Broccoli, raw, ½ cup (145 mg). °· Cabbage, ½ cup (150 mg). °· Carrots, cooked or raw, ½ cup (180 mg). °· Cauliflower, raw, ½ cup (150 mg). °· Celery, raw, ½ cup (155 mg). °· Cereal, bran flakes, ½cup (120-150 mg). °· Cheese, cottage, ½ cup (110 mg). °· Cherries, 10 each (150 mg). °· Chocolate, 1½ oz bar (165 mg). °· Coffee, brewed 6 oz (90 mg). °· Corn, ½ cup or 1 ear (195 mg). °· Cucumbers, ½ cup (80 mg). °· Egg, large, 1 each (60 mg). °· Eggplant, ½ cup (60 mg). °· Endive, raw, ½cup (80 mg). °· English muffin, 1 each (65 mg). °· Fish, orange roughy, 3 oz (150 mg). °· Frankfurter, beef or pork, 1 each (75 mg). °· Fruit cocktail, ½ cup (115 mg). °· Grape juice, ½ cup (170 mg). °· Grapefruit, ½ fruit (175 mg). °· Grapes, ½ cup  (155 mg). °· Greens: kale, turnip, collard, ½ cup (110-150 mg). °· Ice cream or frozen yogurt, chocolate, ½ cup (175 mg). °· Ice cream or frozen yogurt, vanilla, ½ cup (120-150 mg). °· Lemons, limes, 1 each (80 mg). °· Lettuce, all types, 1 cup (100 mg). °· Mixed vegetables, ½ cup (150 mg). °· Mushrooms, raw, ½ cup (110 mg). °· Nuts: walnuts, pecans, or macadamia, 1 oz (125 mg). °· Oatmeal, ½ cup (80 mg). °· Okra, ½ cup (110 mg). °· Onions, raw, ½ cup (120 mg). °· Peach, 1 each (185 mg). °· Peaches, canned, ½ cup (120 mg). °· Pears, canned, ½ cup (120 mg). °· Peas, green, frozen, ½ cup (90 mg). °· Peppers, green, ½ cup (130 mg). °· Peppers, red, ½ cup (160 mg). °· Pineapple juice, ½ cup (165 mg). °· Pineapple, fresh or canned, ½ cup (100 mg). °· Plums, 1 each (105 mg). °· Pudding, vanilla, ½ cup (150 mg). °· Raspberries, ½ cup (90 mg). °· Rhubarb, ½ cup (115 mg). °· Rice, wild, ½ cup (80 mg). °· Shrimp, 3 oz (155 mg). °· Spinach, raw, 1 cup (170 mg). °· Strawberries, ½ cup (125 mg). °· Summer squash ½ cup (175-200 mg). °· Swiss chard, raw, 1 cup (135 mg). °· Tangerines, 1 each (140 mg). °· Tea, brewed, 6 oz (65 mg). °· Turnips, ½ cup (140 mg). °· Watermelon, ½ cup (85 mg). °· Wine, red, table, 5 oz (180 mg). °· Wine, white, table, 5 oz (100 mg). °LOW IN POTASSIUM °The following foods and beverages have less than 50 mg of potassium per serving. °· Bread, white, 1 slice (30 mg). °· Carbonated beverages, 12 oz (less than 5 mg). °· Cheese, 1 oz (20-30 mg). °· Cranberries, ½ cup (45 mg). °· Cranberry juice cocktail, ½ cup (20 mg). °· Fats and oils, 1 Tbsp (less than 5 mg). °· Hummus, 1 Tbsp (32 mg). °· Nectar: papaya, mango, or pear, ½ cup (35 mg). °· Rice, white or brown, ½ cup (50 mg). °· Spaghetti or macaroni, ½ cup cooked (30 mg). °· Tortilla, flour or corn, 1 each (50 mg). °· Waffle, 4 in., 1 each (50 mg). °· Water chestnuts, ½ cup (40 mg). °  Document Released: 07/16/2005 Document Revised: 12/07/2013 Document  Reviewed: 10/29/2013 °[employer]® Patient Information ©2015 [employer]. This information is not intended to replace advice given to you by your health care provider. Make sure you discuss any questions you have with your health care provider. ° °

## 2014-11-15 NOTE — ED Provider Notes (Signed)
CSN: [id number]     Arrival date & time 11/15/14  1105 History   First MD Initiated Contact with Patient 11/15/14 1126     Chief Complaint  Patient presents with  . Abnormal Lab     (Consider location/radiation/quality/duration/timing/severity/associated sxs/prior Treatment) HPI  Pt is a 58yo male with hx of anxiety, depression, HTN, Hep C, alcoholic cirrhosis, liver mass and hepatocellular carcinoma, presenting to ED with reports of low potassium. Pt states he was seen yesterday at the [facility] for f/u on routine labs, was advised his potassium came back about 2.4, he was advised to come to ED for further evaluation and tx as needed. Pt states he feels fine. He was planning on taking it easy today.  Denies chest pain, SOB, abdominal pain, n/v/d. No previous hx of hypokalemia. Pt does state he is on diuretics which he has been told could lower his potassium but states he has been on them for almost 6 months w/o a problem.  No recent illness, fever, or chills.    Past Medical History  Diagnosis Date  . Depression   . Anxiety   . Panic attack   . Hypertension   . Hepatitis C   . Claudication of left lower extremity m-1  . Abnormal MRI of abdomen, liver  04/21/2014  . Cirrhosis, alcoholic 01/23/3150  . Liver mass   . [place] (hepatocellular carcinoma)    Past Surgical History  Procedure Laterality Date  . Cosmetic surgery      facial reconstructive surgery  . Aortogram     Family History  Problem Relation Age of Onset  . Diabetes Father   . Diabetes Brother   . Diabetes Brother    History  Substance Use Topics  . Smoking status: Former Smoker -- 0.50 packs/day    Quit date: 08/21/2011  . Smokeless tobacco: Never Used  . Alcohol Use: 3.6 oz/week    6 Cans of beer per week     Comment: Occasional beer on the weekends    Review of Systems  Constitutional: Negative for fever, chills, diaphoresis, appetite change and fatigue.  Respiratory: Negative for cough and shortness of  breath.   Cardiovascular: Negative for chest pain and palpitations.  Gastrointestinal: Negative for nausea, vomiting, abdominal pain, diarrhea and constipation.  Genitourinary: Positive for frequency ( chronic due to his hydrochlorothiazide). Negative for dysuria, hematuria and flank pain.  All other systems reviewed and are negative.     Allergies  Folic acid  Home Medications   Prior to Admission medications   Medication Sig Start Date End Date Taking? Authorizing Provider  ALPRAZolam Duanne Moron) 0.5 MG tablet Take 0.5 mg by mouth 3 (three) times daily as needed for anxiety.    Yes Historical Provider, MD  aspirin EC 81 MG tablet Take 81 mg by mouth daily.   Yes Historical Provider, MD  docusate sodium 100 MG CAPS Take 100 mg by mouth 2 (two) times daily. 06/11/14  Yes Ascencion Dike, PA-C  ergocalciferol (DRISDOL) 50000 UNITS capsule Take 1 capsule (50,000 Units total) by mouth once a week. 03/31/14  Yes Dorena Dew, FNP  FLUoxetine (PROZAC) 20 MG capsule Take 20 mg by mouth every morning.    Yes Historical Provider, MD  hydrochlorothiazide (HYDRODIURIL) 25 MG tablet Take 25 mg by mouth every morning.   Yes Historical Provider, MD  thiamine 100 MG tablet Take 1 tablet (100 mg total) by mouth daily. 05/02/14  Yes Modena Jansky, MD  zolpidem (AMBIEN) 10 MG tablet  Take 10 mg by mouth at bedtime.    Yes Historical Provider, MD  benazepril (LOTENSIN) 40 MG tablet Take 40 mg by mouth every morning.    Historical Provider, MD  folic acid (FOLVITE) 1 MG tablet Take 1 tablet (1 mg total) by mouth daily. Patient not taking: Reported on 11/15/2014 07/06/14   Leana Gamer, MD  oxycodone (OXY-IR) 5 MG capsule Take 1 capsule (5 mg total) by mouth every 4 (four) hours as needed. Patient not taking: Reported on 11/15/2014 07/06/14   Leana Gamer, MD  potassium chloride SA (K-DUR,KLOR-CON) 20 MEQ tablet Take 2 tablets (40 mEq total) by mouth daily. For 5 days 11/15/14   Noland Fordyce, PA-C    BP 132/72 mmHg  Pulse 66  Temp(Src) 97.5 F (36.4 C)  Resp 20  SpO2 100% Physical Exam  Constitutional: He appears well-developed and well-nourished.  HENT:  Head: Normocephalic and atraumatic.  Eyes: Conjunctivae are normal. No scleral icterus.  Neck: Normal range of motion.  Cardiovascular: Normal rate, regular rhythm and normal heart sounds.   Pulmonary/Chest: Effort normal and breath sounds normal. No respiratory distress. He has no wheezes. He has no rales. He exhibits no tenderness.  Abdominal: Soft. Bowel sounds are normal. He exhibits no distension and no mass. There is no tenderness. There is no rebound and no guarding.  Musculoskeletal: Normal range of motion.  Neurological: He is alert.  Skin: Skin is warm and dry.  Nursing note and vitals reviewed.   ED Course  Procedures (including critical care time) Labs Review Labs Reviewed  BASIC METABOLIC PANEL - Abnormal; Notable for the following:    Potassium 2.6 (*)    Glucose, Bld 102 (*)    All other components within normal limits  CBC - Abnormal; Notable for the following:    WBC 3.9 (*)    RBC 4.01 (*)    HCT 38.9 (*)    Platelets 74 (*)    All other components within normal limits    Imaging Review No results found.   EKG Interpretation   Date/Time:  Tuesday November 15 2014 13:34:38 EST Ventricular Rate:  59 PR Interval:  135 QRS Duration: 102 QT Interval:  546 QTC Calculation: 541 R Axis:   -4 Text Interpretation:  Sinus rhythm Probable anteroseptal infarct, old  Prolonged QT interval No significant change since last tracing Confirmed  by Winfred Leeds  MD, SAM [phone]) on 11/15/2014 3:05:45 PM      MDM   Final diagnoses:  Hypokalemia    Pt is a 58yo male sent to ED from "[facility]" after pt had routine blood work drawn yesterday, advised pt he had a low potassium of 2.4.  Pt has been on hydrochlorothiazide for almost 6 months w/o issues. Denies recent vomiting or diarrhea. No previous hx  of hypokalemia. Pt is asymptomatic, appears well, afebrile. No respiratory distress or chest pain. Heart: regular rate and rhythm.   EKG: sinus rhythm, prolonged QT, no significant change since last tracing.   Labs: K+ significant for hypokalemia- 2.6. Pt given 49mEq PO and 93mEq potassium IV. Pt remained asymptomatic while in ED.   Discussed pt with Dr. Winfred Leeds, pt may be discharged home with PO potassium 22mEq daily for 5 days then have potassium rechecked next week.  Pt mentioned he is suppose to be on a low-sodium diet, requested help with information on foods he can eat.  Pt education for hypokalemia and low-sodium diet provided to pt.  Return precautions provided. Pt verbalized  understanding and agreement with tx plan.    Noland Fordyce, PA-C 11/15/14 [place], MD 11/15/14 [phone]

## 2014-11-15 NOTE — ED Notes (Signed)
Pt states had blood checked yesterday at pcp and doctor called this morning telling him potassium was low and to come to hospital

## 2014-11-16 ENCOUNTER — Telehealth (HOSPITAL_BASED_OUTPATIENT_CLINIC_OR_DEPARTMENT_OTHER): Payer: Self-pay | Admitting: Emergency Medicine

## 2014-11-17 ENCOUNTER — Encounter: Payer: Self-pay | Admitting: Family

## 2014-11-18 ENCOUNTER — Ambulatory Visit: Payer: Self-pay | Admitting: Family

## 2014-11-20 ENCOUNTER — Other Ambulatory Visit: Payer: Self-pay | Admitting: Internal Medicine

## 2014-11-24 ENCOUNTER — Encounter (HOSPITAL_COMMUNITY): Payer: Self-pay | Admitting: Vascular Surgery

## 2014-11-28 ENCOUNTER — Other Ambulatory Visit: Payer: Self-pay | Admitting: Internal Medicine

## 2014-12-06 ENCOUNTER — Ambulatory Visit: Payer: Self-pay | Admitting: Family

## 2014-12-14 ENCOUNTER — Other Ambulatory Visit (HOSPITAL_COMMUNITY): Payer: Self-pay | Admitting: Interventional Radiology

## 2014-12-14 ENCOUNTER — Ambulatory Visit: Payer: Self-pay | Admitting: Family Medicine

## 2014-12-14 DIAGNOSIS — C220 Liver cell carcinoma: Secondary | ICD-10-CM

## 2014-12-15 ENCOUNTER — Ambulatory Visit: Payer: Self-pay | Admitting: Family Medicine

## 2014-12-16 ENCOUNTER — Encounter (HOSPITAL_COMMUNITY): Payer: Self-pay | Admitting: Emergency Medicine

## 2014-12-16 ENCOUNTER — Inpatient Hospital Stay (HOSPITAL_COMMUNITY)
Admission: EM | Admit: 2014-12-16 | Discharge: 2014-12-17 | DRG: 812 | Disposition: A | Discharge status: Home / Self Care | Payer: Medicaid Other | Attending: Internal Medicine | Admitting: Internal Medicine

## 2014-12-16 DIAGNOSIS — G8929 Other chronic pain: Secondary | ICD-10-CM | POA: Diagnosis present

## 2014-12-16 DIAGNOSIS — I10 Essential (primary) hypertension: Secondary | ICD-10-CM | POA: Diagnosis present

## 2014-12-16 DIAGNOSIS — K7030 Alcoholic cirrhosis of liver without ascites: Secondary | ICD-10-CM | POA: Diagnosis present

## 2014-12-16 DIAGNOSIS — R112 Nausea with vomiting, unspecified: Secondary | ICD-10-CM | POA: Diagnosis present

## 2014-12-16 DIAGNOSIS — T502X5A Adverse effect of carbonic-anhydrase inhibitors, benzothiadiazides and other diuretics, initial encounter: Secondary | ICD-10-CM | POA: Diagnosis present

## 2014-12-16 DIAGNOSIS — F32A Depression, unspecified: Secondary | ICD-10-CM | POA: Diagnosis present

## 2014-12-16 DIAGNOSIS — Z79899 Other long term (current) drug therapy: Secondary | ICD-10-CM | POA: Diagnosis not present

## 2014-12-16 DIAGNOSIS — K922 Gastrointestinal hemorrhage, unspecified: Secondary | ICD-10-CM | POA: Diagnosis present

## 2014-12-16 DIAGNOSIS — K449 Diaphragmatic hernia without obstruction or gangrene: Secondary | ICD-10-CM | POA: Diagnosis present

## 2014-12-16 DIAGNOSIS — M79609 Pain in unspecified limb: Secondary | ICD-10-CM | POA: Diagnosis present

## 2014-12-16 DIAGNOSIS — K2980 Duodenitis without bleeding: Secondary | ICD-10-CM | POA: Diagnosis present

## 2014-12-16 DIAGNOSIS — F329 Major depressive disorder, single episode, unspecified: Secondary | ICD-10-CM | POA: Diagnosis present

## 2014-12-16 DIAGNOSIS — D62 Acute posthemorrhagic anemia: Secondary | ICD-10-CM | POA: Diagnosis present

## 2014-12-16 DIAGNOSIS — Z833 Family history of diabetes mellitus: Secondary | ICD-10-CM

## 2014-12-16 DIAGNOSIS — Z8505 Personal history of malignant neoplasm of liver: Secondary | ICD-10-CM | POA: Diagnosis not present

## 2014-12-16 DIAGNOSIS — K2970 Gastritis, unspecified, without bleeding: Secondary | ICD-10-CM | POA: Diagnosis present

## 2014-12-16 DIAGNOSIS — K269 Duodenal ulcer, unspecified as acute or chronic, without hemorrhage or perforation: Secondary | ICD-10-CM | POA: Diagnosis present

## 2014-12-16 DIAGNOSIS — Z888 Allergy status to other drugs, medicaments and biological substances status: Secondary | ICD-10-CM

## 2014-12-16 DIAGNOSIS — E876 Hypokalemia: Secondary | ICD-10-CM | POA: Diagnosis present

## 2014-12-16 DIAGNOSIS — Z7982 Long term (current) use of aspirin: Secondary | ICD-10-CM

## 2014-12-16 DIAGNOSIS — D6959 Other secondary thrombocytopenia: Secondary | ICD-10-CM | POA: Diagnosis present

## 2014-12-16 DIAGNOSIS — D696 Thrombocytopenia, unspecified: Secondary | ICD-10-CM | POA: Diagnosis present

## 2014-12-16 DIAGNOSIS — K921 Melena: Secondary | ICD-10-CM | POA: Diagnosis present

## 2014-12-16 DIAGNOSIS — E559 Vitamin D deficiency, unspecified: Secondary | ICD-10-CM | POA: Diagnosis present

## 2014-12-16 DIAGNOSIS — D649 Anemia, unspecified: Secondary | ICD-10-CM | POA: Diagnosis present

## 2014-12-16 DIAGNOSIS — I739 Peripheral vascular disease, unspecified: Secondary | ICD-10-CM | POA: Diagnosis present

## 2014-12-16 DIAGNOSIS — D500 Iron deficiency anemia secondary to blood loss (chronic): Secondary | ICD-10-CM | POA: Diagnosis present

## 2014-12-16 DIAGNOSIS — B1920 Unspecified viral hepatitis C without hepatic coma: Secondary | ICD-10-CM | POA: Diagnosis present

## 2014-12-16 DIAGNOSIS — C220 Liver cell carcinoma: Secondary | ICD-10-CM | POA: Diagnosis present

## 2014-12-16 DIAGNOSIS — F419 Anxiety disorder, unspecified: Secondary | ICD-10-CM | POA: Diagnosis present

## 2014-12-16 DIAGNOSIS — Z87891 Personal history of nicotine dependence: Secondary | ICD-10-CM | POA: Diagnosis not present

## 2014-12-16 LAB — APTT: aPTT: 31 seconds (ref 24–37)

## 2014-12-16 LAB — URINALYSIS, ROUTINE W REFLEX MICROSCOPIC
Bilirubin Urine: NEGATIVE
Glucose, UA: NEGATIVE mg/dL
Hgb urine dipstick: NEGATIVE
Ketones, ur: NEGATIVE mg/dL
Leukocytes, UA: NEGATIVE
Nitrite: NEGATIVE
Protein, ur: NEGATIVE mg/dL
Specific Gravity, Urine: 1.018 (ref 1.005–1.030)
Urobilinogen, UA: 1 mg/dL (ref 0.0–1.0)
pH: 8 (ref 5.0–8.0)

## 2014-12-16 LAB — CBC WITH DIFFERENTIAL/PLATELET
Basophils Absolute: 0 10*3/uL (ref 0.0–0.1)
Basophils Relative: 0 % (ref 0–1)
Eosinophils Absolute: 0.1 10*3/uL (ref 0.0–0.7)
Eosinophils Relative: 2 % (ref 0–5)
HCT: 23.9 % — ABNORMAL LOW (ref 39.0–52.0)
Hemoglobin: 7.9 g/dL — ABNORMAL LOW (ref 13.0–17.0)
Lymphocytes Relative: 13 % (ref 12–46)
Lymphs Abs: 0.9 10*3/uL (ref 0.7–4.0)
MCH: 32.8 pg (ref 26.0–34.0)
MCHC: 33.1 g/dL (ref 30.0–36.0)
MCV: 99.2 fL (ref 78.0–100.0)
Monocytes Absolute: 0.9 10*3/uL (ref 0.1–1.0)
Monocytes Relative: 13 % — ABNORMAL HIGH (ref 3–12)
Neutro Abs: 4.7 10*3/uL (ref 1.7–7.7)
Neutrophils Relative %: 72 % (ref 43–77)
Platelets: 73 10*3/uL — ABNORMAL LOW (ref 150–400)
RBC: 2.41 MIL/uL — ABNORMAL LOW (ref 4.22–5.81)
RDW: 15.2 % (ref 11.5–15.5)
WBC: 6.6 10*3/uL (ref 4.0–10.5)

## 2014-12-16 LAB — MAGNESIUM: Magnesium: 1.9 mg/dL (ref 1.5–2.5)

## 2014-12-16 LAB — COMPREHENSIVE METABOLIC PANEL
ALT: 32 U/L (ref 0–53)
AST: 53 U/L — ABNORMAL HIGH (ref 0–37)
Albumin: 2.9 g/dL — ABNORMAL LOW (ref 3.5–5.2)
Alkaline Phosphatase: 69 U/L (ref 39–117)
Anion gap: 9 (ref 5–15)
BUN: 47 mg/dL — ABNORMAL HIGH (ref 6–23)
CO2: 25 mmol/L (ref 19–32)
Calcium: 8.9 mg/dL (ref 8.4–10.5)
Chloride: 101 mEq/L (ref 96–112)
Creatinine, Ser: 0.96 mg/dL (ref 0.50–1.35)
GFR calc Af Amer: >90 mL/min (ref >90)
GFR calc non Af Amer: 89 mL/min — ABNORMAL LOW (ref >90)
Glucose, Bld: 140 mg/dL — ABNORMAL HIGH (ref 70–99)
Potassium: 2.8 mmol/L — ABNORMAL LOW (ref 3.5–5.1)
Sodium: 135 mmol/L (ref 135–145)
Total Bilirubin: 1.3 mg/dL — ABNORMAL HIGH (ref 0.3–1.2)
Total Protein: 5.3 g/dL — ABNORMAL LOW (ref 6.0–8.3)

## 2014-12-16 LAB — CBC
HCT: 21.4 % — ABNORMAL LOW (ref 39.0–52.0)
Hemoglobin: 7 g/dL — ABNORMAL LOW (ref 13.0–17.0)
MCH: 32.4 pg (ref 26.0–34.0)
MCHC: 32.7 g/dL (ref 30.0–36.0)
MCV: 99.1 fL (ref 78.0–100.0)
Platelets: 60 10*3/uL — ABNORMAL LOW (ref 150–400)
RBC: 2.16 MIL/uL — ABNORMAL LOW (ref 4.22–5.81)
RDW: 15.1 % (ref 11.5–15.5)
WBC: 5.3 10*3/uL (ref 4.0–10.5)

## 2014-12-16 LAB — PROTIME-INR
INR: 1.5 — ABNORMAL HIGH (ref 0.00–1.49)
Prothrombin Time: 18.3 seconds — ABNORMAL HIGH (ref 11.6–15.2)

## 2014-12-16 LAB — PREPARE RBC (CROSSMATCH)

## 2014-12-16 LAB — RETICULOCYTES
RBC.: 2.35 MIL/uL — ABNORMAL LOW (ref 4.22–5.81)
Retic Count, Absolute: 82.3 10*3/uL (ref 19.0–186.0)
Retic Ct Pct: 3.5 % — ABNORMAL HIGH (ref 0.4–3.1)

## 2014-12-16 MED ORDER — MAGNESIUM CITRATE PO SOLN: 1.0000 | Freq: Once | ORAL | Status: AC | PRN

## 2014-12-16 MED ORDER — ONDANSETRON HCL 4 MG PO TABS: 4.0000 mg | ORAL_TABLET | Freq: Four times a day (QID) | ORAL | Status: DC | PRN

## 2014-12-16 MED ORDER — DOCUSATE SODIUM 100 MG PO CAPS
100.0000 mg | ORAL_CAPSULE | Freq: Two times a day (BID) | ORAL | Status: DC
  Administered 2014-12-17: 100 mg via ORAL
  Filled 2014-12-16 (×4): qty 1

## 2014-12-16 MED ORDER — ACETAMINOPHEN 325 MG PO TABS
325.0000 mg | ORAL_TABLET | Freq: Once | ORAL | Status: AC
  Administered 2014-12-16: 325 mg via ORAL
  Filled 2014-12-16: qty 1

## 2014-12-16 MED ORDER — IPRATROPIUM BROMIDE 0.02 % IN SOLN: 0.5000 mg | RESPIRATORY_TRACT | Status: DC | PRN

## 2014-12-16 MED ORDER — PANTOPRAZOLE SODIUM 40 MG IV SOLR
40.0000 mg | Freq: Once | INTRAVENOUS | Status: AC
  Administered 2014-12-16: 40 mg via INTRAVENOUS
  Filled 2014-12-16: qty 40

## 2014-12-16 MED ORDER — FUROSEMIDE 10 MG/ML IJ SOLN
20.0000 mg | Freq: Once | INTRAMUSCULAR | Status: AC
  Administered 2014-12-16: 20 mg via INTRAVENOUS
  Filled 2014-12-16: qty 2

## 2014-12-16 MED ORDER — HYDROCHLOROTHIAZIDE 25 MG PO TABS
25.0000 mg | ORAL_TABLET | Freq: Every morning | ORAL | Status: DC
  Administered 2014-12-17: 25 mg via ORAL
  Filled 2014-12-16: qty 1

## 2014-12-16 MED ORDER — SODIUM CHLORIDE 0.9 % IV BOLUS (SEPSIS)
1000.0000 mL | Freq: Once | INTRAVENOUS | Status: AC
  Administered 2014-12-16: 1000 mL via INTRAVENOUS

## 2014-12-16 MED ORDER — PANTOPRAZOLE SODIUM 40 MG IV SOLR
40.0000 mg | Freq: Two times a day (BID) | INTRAVENOUS | Status: DC
  Administered 2014-12-17 (×2): 40 mg via INTRAVENOUS
  Filled 2014-12-16 (×2): qty 40

## 2014-12-16 MED ORDER — CETYLPYRIDINIUM CHLORIDE 0.05 % MT LIQD
7.0000 mL | Freq: Two times a day (BID) | OROMUCOSAL | Status: DC
  Administered 2014-12-16: 7 mL via OROMUCOSAL

## 2014-12-16 MED ORDER — SODIUM CHLORIDE 0.9 % IJ SOLN
3.0000 mL | Freq: Two times a day (BID) | INTRAMUSCULAR | Status: DC
  Administered 2014-12-16 (×2): 3 mL via INTRAVENOUS

## 2014-12-16 MED ORDER — DIPHENHYDRAMINE HCL 25 MG PO CAPS
25.0000 mg | ORAL_CAPSULE | Freq: Once | ORAL | Status: AC
  Administered 2014-12-16: 25 mg via ORAL
  Filled 2014-12-16: qty 1

## 2014-12-16 MED ORDER — POLYETHYLENE GLYCOL 3350 17 G PO PACK
17.0000 g | PACK | Freq: Every day | ORAL | Status: DC | PRN
Start: 2014-12-16 — End: 2014-12-17
  Filled 2014-12-16: qty 1

## 2014-12-16 MED ORDER — BISACODYL 5 MG PO TBEC: 5.0000 mg | DELAYED_RELEASE_TABLET | Freq: Every day | ORAL | Status: DC | PRN

## 2014-12-16 MED ORDER — ONDANSETRON HCL 4 MG/2ML IJ SOLN
4.0000 mg | Freq: Four times a day (QID) | INTRAMUSCULAR | Status: DC | PRN
  Administered 2014-12-16: 4 mg via INTRAVENOUS
  Filled 2014-12-16: qty 2

## 2014-12-16 MED ORDER — VITAMIN B-1 100 MG PO TABS
100.0000 mg | ORAL_TABLET | Freq: Every day | ORAL | Status: DC
  Administered 2014-12-16 – 2014-12-17 (×2): 100 mg via ORAL
  Filled 2014-12-16 (×2): qty 1

## 2014-12-16 MED ORDER — FLUOXETINE HCL 20 MG PO CAPS
20.0000 mg | ORAL_CAPSULE | Freq: Every morning | ORAL | Status: DC
  Administered 2014-12-16 – 2014-12-17 (×2): 20 mg via ORAL
  Filled 2014-12-16 (×2): qty 1

## 2014-12-16 MED ORDER — SPIRONOLACTONE 25 MG PO TABS
25.0000 mg | ORAL_TABLET | Freq: Every day | ORAL | Status: DC
  Administered 2014-12-16 – 2014-12-17 (×2): 25 mg via ORAL
  Filled 2014-12-16 (×3): qty 1

## 2014-12-16 MED ORDER — OXYCODONE HCL 5 MG PO TABS
5.0000 mg | ORAL_TABLET | Freq: Once | ORAL | Status: AC
  Administered 2014-12-16: 5 mg via ORAL
  Filled 2014-12-16: qty 1

## 2014-12-16 MED ORDER — INFLUENZA VAC SPLIT QUAD 0.5 ML IM SUSY
0.5000 mL | PREFILLED_SYRINGE | INTRAMUSCULAR | Status: DC
  Filled 2014-12-16 (×2): qty 0.5

## 2014-12-16 MED ORDER — SODIUM CHLORIDE 0.9 % IV SOLN: INTRAVENOUS | Status: DC

## 2014-12-16 MED ORDER — POTASSIUM CHLORIDE CRYS ER 20 MEQ PO TBCR
40.0000 meq | EXTENDED_RELEASE_TABLET | Freq: Once | ORAL | Status: AC
  Administered 2014-12-16: 40 meq via ORAL
  Filled 2014-12-16: qty 2

## 2014-12-16 MED ORDER — BENAZEPRIL HCL 40 MG PO TABS
40.0000 mg | ORAL_TABLET | Freq: Every morning | ORAL | Status: DC
  Administered 2014-12-16 – 2014-12-17 (×2): 40 mg via ORAL
  Filled 2014-12-16 (×2): qty 1

## 2014-12-16 MED ORDER — SORBITOL 70 % SOLN
30.0000 mL | Freq: Every day | Status: DC | PRN
  Filled 2014-12-16: qty 30

## 2014-12-16 MED ORDER — OXYCODONE HCL 5 MG PO TABS
5.0000 mg | ORAL_TABLET | ORAL | Status: DC | PRN
  Administered 2014-12-16 – 2014-12-17 (×2): 5 mg via ORAL
  Filled 2014-12-16 (×2): qty 1

## 2014-12-16 MED ORDER — ALBUTEROL SULFATE (2.5 MG/3ML) 0.083% IN NEBU: 2.5000 mg | INHALATION_SOLUTION | RESPIRATORY_TRACT | Status: DC | PRN

## 2014-12-16 MED ORDER — ALPRAZOLAM 0.5 MG PO TABS
0.5000 mg | ORAL_TABLET | Freq: Three times a day (TID) | ORAL | Status: DC | PRN
  Administered 2014-12-16 – 2014-12-17 (×2): 0.5 mg via ORAL
  Filled 2014-12-16 (×2): qty 1

## 2014-12-16 MED ORDER — POTASSIUM CHLORIDE CRYS ER 20 MEQ PO TBCR: 40.0000 meq | EXTENDED_RELEASE_TABLET | Freq: Once | ORAL | Status: DC

## 2014-12-16 MED ORDER — ZOLPIDEM TARTRATE 10 MG PO TABS
10.0000 mg | ORAL_TABLET | Freq: Every day | ORAL | Status: DC
  Administered 2014-12-16: 10 mg via ORAL
  Filled 2014-12-16: qty 1

## 2014-12-16 MED ORDER — SODIUM CHLORIDE 0.9 % IV SOLN
Freq: Once | INTRAVENOUS | Status: AC
  Administered 2014-12-16: 15:00:00 via INTRAVENOUS

## 2014-12-16 MED ORDER — ALUM & MAG HYDROXIDE-SIMETH 200-200-20 MG/5ML PO SUSP: 30.0000 mL | Freq: Four times a day (QID) | ORAL | Status: DC | PRN

## 2014-12-16 NOTE — H&P (Signed)
[facility] History and Physical  Jeffrey Snow ZOX:[id number] DOB: 07-May-1956 DOA: 12/16/2014  Referring physician: Dr Jeffrey Snow PCP: Jeffrey A., MD   Chief Complaint: nausea, emesis, foot pain  HPI: Jeffrey Snow is a 59 y.o. male  With history of depression/anxiety, hepatitis C, chronic right foot pain, history of hepatocellular carcinoma, alcoholic cirrhosis, hypertension who presented to the ED with a one-day history of nausea emesis and 2-3 melanotic bowel movements. Patient stated that he ate a hot dog 1 day prior to admission which was a couple days old and had an episode of red emesis but stated that he is just taking some red colored cough syrup. Patient stated also had 2-3 bowel movements that were black. Patient denies any further nausea vomiting or melanotic stools today. Patient denies any fever, no chills, no shortness of breath, no chest pain, no cough, no abdominal pain, no dysuria, no weakness, no hematochezia, no further hematemesis. Patient denies any use of NSAIDs. Patient states he's on a on a baby aspirin. Patient also complaining of right foot pain that has been ongoing for several years now. Patient was seen in the emergency room compressive metabolic profile done had a potassium of 2.8 BUN of 47 albumin of 2.9 AST of 53 bilirubin of 1.3 otherwise was within normal limits. CBC done had a hemoglobin of 7.9 which was 13.3 on 11/15/2014. Patient also had a platelet count of 73. [facility] was called to admit the patient for further evaluation and management.   Review of Systems: As per history of present illness otherwise negative. Constitutional:  No weight loss, night sweats, Fevers, chills, fatigue.  HEENT:  No headaches, Difficulty swallowing,Tooth/dental problems,Sore throat,  No sneezing, itching, ear ache, nasal congestion, post nasal drip,  Cardio-vascular:  No chest pain, Orthopnea, PND, swelling in lower extremities, anasarca,  dizziness, palpitations  GI:  No heartburn, indigestion, abdominal pain, nausea, vomiting, diarrhea, change in bowel habits, loss of appetite  Resp:  No shortness of breath with exertion or at rest. No excess mucus, no productive cough, No non-productive cough, No coughing up of blood.No change in color of mucus.No wheezing.No chest wall deformity  Skin:  no rash or lesions.  GU:  no dysuria, change in color of urine, no urgency or frequency. No flank pain.  Musculoskeletal:  No joint pain or swelling. No decreased range of motion. No back pain.  Psych:  No change in mood or affect. No depression or anxiety. No memory loss.   Past Medical History  Diagnosis Date  . Depression   . Anxiety   . Panic attack   . Hypertension   . Hepatitis C   . Claudication of left lower extremity m-1  . Abnormal MRI of abdomen, liver  04/21/2014  . Cirrhosis, alcoholic 07/16/1913  . Liver mass   . [place] (hepatocellular carcinoma)    Past Surgical History  Procedure Laterality Date  . Cosmetic surgery      facial reconstructive surgery  . Aortogram    . Abdominal aortagram N/A 12/27/2013    Procedure: ABDOMINAL Maxcine Ham;  Surgeon: Conrad Garfield, MD;  Location: [facility] CATH LAB;  Service: Cardiovascular;  Laterality: N/A;  . Lower extremity angiogram Bilateral 12/27/2013    Procedure: LOWER EXTREMITY ANGIOGRAM;  Surgeon: Conrad , MD;  Location[facility] LAB;  Service: Cardiovascular;  Laterality: Bilateral;   Social History:  reports that he quit smoking about 3 years ago. He has never used smokeless tobacco. He reports that he drinks about  3.6 oz of alcohol per week. He reports that he does not use illicit drugs.  Allergies  Allergen Reactions  . Folic Acid Other (See Comments)    Makes stomach feel funny    Family History  Problem Relation Age of Onset  . Diabetes Father   . Diabetes Brother   . Diabetes Brother      Prior to Admission medications   Medication Sig Start Date End Date Taking?  Authorizing Provider  ALPRAZolam Duanne Moron) 0.5 MG tablet Take 0.5 mg by mouth 3 (three) times daily as needed for anxiety.    Yes Historical Provider, MD  aspirin EC 81 MG tablet Take 81 mg by mouth daily.   Yes Historical Provider, MD  benazepril (LOTENSIN) 40 MG tablet Take 40 mg by mouth every morning.   Yes Historical Provider, MD  bisacodyl (DULCOLAX) 5 MG EC tablet Take 5 mg by mouth daily as needed for mild constipation or moderate constipation.   Yes Historical Provider, MD  docusate sodium 100 MG CAPS Take 100 mg by mouth 2 (two) times daily. 06/11/14  Yes Ascencion Dike, PA-C  ergocalciferol (DRISDOL) 50000 UNITS capsule Take 1 capsule (50,000 Units total) by mouth once a week. 03/31/14  Yes Dorena Dew, FNP  FLUoxetine (PROZAC) 20 MG capsule Take 20 mg by mouth every morning.    Yes Historical Provider, MD  hydrochlorothiazide (HYDRODIURIL) 25 MG tablet Take 25 mg by mouth every morning.   Yes Historical Provider, MD  spironolactone (ALDACTONE) 25 MG tablet Take 25 mg by mouth daily.   Yes Historical Provider, MD  thiamine 100 MG tablet Take 1 tablet (100 mg total) by mouth daily. 05/02/14  Yes Modena Jansky, MD  zolpidem (AMBIEN) 10 MG tablet Take 10 mg by mouth at bedtime.    Yes Historical Provider, MD  folic acid (FOLVITE) 1 MG tablet Take 1 tablet (1 mg total) by mouth daily. Patient not taking: Reported on 11/15/2014 07/06/14   Leana Gamer, MD  oxycodone (OXY-IR) 5 MG capsule Take 1 capsule (5 mg total) by mouth every 4 (four) hours as needed. Patient not taking: Reported on 11/15/2014 07/06/14   Leana Gamer, MD  potassium chloride SA (K-DUR,KLOR-CON) 20 MEQ tablet Take 2 tablets (40 mEq total) by mouth daily. For 5 days Patient not taking: Reported on 12/16/2014 11/15/14   Noland Fordyce, PA-C   Physical Exam: Filed Vitals:   12/16/14 1030 12/16/14 1315  BP: 108/61 110/62  Pulse: 93 94  Temp: 97.5 F (36.4 C)   TempSrc: Oral   Resp: 18 18  SpO2: 99% 100%     Wt Readings from Last 3 Encounters:  10/04/14 62.143 kg (137 lb)  07/13/14 61.236 kg (135 lb)  07/06/14 65.318 kg (144 lb)    General:  Frail well-developed well-nourished in no acute cardiopulmonary distress.  Eyes: PERRLA, EOMI, normal lids, irises & conjunctiva ENT: grossly normal hearing, lips & tongue. No exudates. Dry mucous membranes. Neck: no LAD, masses or thyromegaly Cardiovascular: RRR, no m/r/g. No LE edema. Respiratory: CTA bilaterally, no w/r/r. Normal respiratory effort. Abdomen: soft, ntnd, positive bowel sounds, no rebound, no guarding. Skin: no rash or induration seen on limited exam Musculoskeletal: grossly normal tone BUE/BLE Psychiatric: grossly normal mood and affect, speech fluent and appropriate Neurologic: Alert and oriented 3. Cranial nerves II through XII are grossly intact. Sensation is intact. Visual fields are intact. Gait not tested secondary to safety. No focal deficits.  Labs on Admission:  Basic Metabolic Panel:  Recent Labs Lab 12/16/14 1140  NA 135  K 2.8*  CL 101  CO2 25  GLUCOSE 140*  BUN 47*  CREATININE 0.96  CALCIUM 8.9   Liver Function Tests:  Recent Labs Lab 12/16/14 1140  AST 53*  ALT 32  ALKPHOS 69  BILITOT 1.3*  PROT 5.3*  ALBUMIN 2.9*   No results for input(s): LIPASE, AMYLASE in the last 168 hours. No results for input(s): AMMONIA in the last 168 hours. CBC:  Recent Labs Lab 12/16/14 1140  WBC 6.6  NEUTROABS 4.7  HGB 7.9*  HCT 23.9*  MCV 99.2  PLT 73*   Cardiac Enzymes: No results for input(s): CKTOTAL, CKMB, CKMBINDEX, TROPONINI in the last 168 hours.  BNP (last 3 results) No results for input(s): PROBNP in the last 8760 hours. CBG: No results for input(s): GLUCAP in the last 168 hours.  Radiological Exams on Admission: No results found.  EKG: None  Assessment/Plan Principal Problem:   Anemia, blood loss Active Problems:   Thrombocytopenia   Pain in limb   Depression   PAD  (peripheral artery disease)   PVD (peripheral vascular disease)   Vitamin D deficiency disease   HTN (hypertension)   Cirrhosis, alcoholic   Hypokalemia   Hepatitis C   HCC (hepatocellular carcinoma)   Melena   Anemia  #1 anemia/acute blood loss anemia Concern for upper GI bleed in a patient with history of cirrhosis and melanotic stools 1 day prior to admission. Patient's hemoglobin on 11/15/2014 was 13.3. Patient is also noted to have a elevated BUN of 47. Patient's hemoglobin on day of admission is 7.9 which is a significant drop. Patient denies any NSAID use. Patient does endorse some melanotic stools. Patient denies any ongoing alcohol abuse. Will admit patient to telemetry. Place patient on the Protonix 40 mg IV every 12 hours. Will check an anemia panel. Will hold patient's aspirin. Will place patient on clear liquids. Will make patient nothing by mouth after midnight. Will transfuse 2 units red packed blood cells Spoke with [name] of [facility], gastroenterology who will consult on the patient and plans are for upper endoscopy tomorrow morning.  #2 melanotic stools/probable upper GI bleed Patient stated had some melanotic stools 1 day prior to admission. Patient is noted to be anemic with hemoglobin at 7.9 from 13.3 a month ago. Patient denies any NSAID use. Patient does have a elevated BUN. Patient with a prior history of alcohol and cirrhosis. Will admit the patient. Place patient on Protonix 40 mg IV every 12 hours. Clear liquids and nothing by mouth after midnight. Hold patient's aspirin. Will transfuse 2 units packed red blood cells. Serial CBCs. Spoke with [name] of gastroenterology will consult on the patient and patient to undergo probable upper endoscopy tomorrow morning. Follow.  #3 depression/anxiety Currently stable. Continue home regimen of Xanax and Prozac.  #4 hypokalemia Likely secondary to diuretics and GI losses. Check a magnesium level. Replete. Patient may need to be  on daily potassium supplementation.  #5 hypertension Stable. Continue benazepril, HCTZ, Aldactone.  #6 history of alcoholic cirrhosis Stable. Compensated. Continue spironolactone. Monitor fluid status. Follow.  #7 history of hepatocellular carcinoma/hepatitis C Outpatient follow-up.  #8 thrombocytopenia Chronic in nature. Likely secondary to alcoholic cirrhosis. Follow.  #9 prophylaxis PPI for GI prophylaxis. SCDs for DVT prophylaxis.   Code Status: Full DVT Prophylaxis: SCDs. Family Communication: Updated patient. No family at bedside. Disposition Plan: Admit to telemetry.  Time spent:  [facility] Pager [phone]

## 2014-12-16 NOTE — ED Notes (Signed)
Pt states that he has been having NV, rt foot pain, pain med refill, abd hernia (that he doesn't have anymore), varicose veins, and more.  Pt is an extensive talker but it is difficult to get exactly why pt is here.

## 2014-12-16 NOTE — ED Provider Notes (Signed)
CSN: [id number]     Arrival date & time 12/16/14  44 History   First MD Initiated Contact with Patient 12/16/14 1041     Chief Complaint  Patient presents with  . Multiple Complaints      (Consider location/radiation/quality/duration/timing/severity/associated sxs/prior Treatment) HPI  59 year old male presents after having nausea, vomiting, and 3 bowel movements yesterday. He ate a hotdog that he thinks was a couple days old. Denies any abdominal pain. He states that one time he had red emesis but states that he just had red colored cough syrup just before. The 3 bowel movements were black. No nausea, vomiting or bowel movements today. He has overall weakness and fatigue has been going on for quite some time, has lost about 40 pounds since having a procedure on his liver by his gastroenterologist. Denies a fevers, chills, chest pain, or shortness of breath. No cough. Is also complaining of right foot pain that he states his been going on for 5-6 years. The only time his foot didn't hurt as when Dr. Zigmund Daniel wrote him oxycodone.  Past Medical History  Diagnosis Date  . Depression   . Anxiety   . Panic attack   . Hypertension   . Hepatitis C   . Claudication of left lower extremity m-1  . Abnormal MRI of abdomen, liver  04/21/2014  . Cirrhosis, alcoholic 12/24/3788  . Liver mass   . [place] (hepatocellular carcinoma)    Past Surgical History  Procedure Laterality Date  . Cosmetic surgery      facial reconstructive surgery  . Aortogram    . Abdominal aortagram N/A 12/27/2013    Procedure: ABDOMINAL Maxcine Ham;  Surgeon: Conrad Terra Alta, MD;  Location: [facility] CATH LAB;  Service: Cardiovascular;  Laterality: N/A;  . Lower extremity angiogram Bilateral 12/27/2013    Procedure: LOWER EXTREMITY ANGIOGRAM;  Surgeon: Conrad Hinckley, MD;  Location: [facility] CATH LAB;  Service: Cardiovascular;  Laterality: Bilateral;   Family History  Problem Relation Age of Onset  . Diabetes Father   . Diabetes Brother   .  Diabetes Brother    History  Substance Use Topics  . Smoking status: Former Smoker -- 0.50 packs/day    Quit date: 08/21/2011  . Smokeless tobacco: Never Used  . Alcohol Use: 3.6 oz/week    6 Cans of beer per week     Comment: Occasional beer on the weekends    Review of Systems  Constitutional: Negative for fever.  Respiratory: Negative for cough and shortness of breath.   Cardiovascular: Negative for chest pain.  Gastrointestinal: Positive for nausea, vomiting and diarrhea. Negative for abdominal pain.  Musculoskeletal: Positive for arthralgias. Negative for back pain.  All other systems reviewed and are negative.     Allergies  Folic acid  Home Medications   Prior to Admission medications   Medication Sig Start Date End Date Taking? Authorizing Provider  ALPRAZolam Duanne Moron) 0.5 MG tablet Take 0.5 mg by mouth 3 (three) times daily as needed for anxiety.     Historical Provider, MD  aspirin EC 81 MG tablet Take 81 mg by mouth daily.    Historical Provider, MD  benazepril (LOTENSIN) 40 MG tablet Take 40 mg by mouth every morning.    Historical Provider, MD  docusate sodium 100 MG CAPS Take 100 mg by mouth 2 (two) times daily. 06/11/14   Ascencion Dike, PA-C  ergocalciferol (DRISDOL) 50000 UNITS capsule Take 1 capsule (50,000 Units total) by mouth once a week. 03/31/14   Asencion Partridge  Hollis, FNP  FLUoxetine (PROZAC) 20 MG capsule Take 20 mg by mouth every morning.     Historical Provider, MD  folic acid (FOLVITE) 1 MG tablet Take 1 tablet (1 mg total) by mouth daily. Patient not taking: Reported on 11/15/2014 07/06/14   Leana Gamer, MD  hydrochlorothiazide (HYDRODIURIL) 25 MG tablet Take 25 mg by mouth every morning.    Historical Provider, MD  oxycodone (OXY-IR) 5 MG capsule Take 1 capsule (5 mg total) by mouth every 4 (four) hours as needed. Patient not taking: Reported on 11/15/2014 07/06/14   Leana Gamer, MD  potassium chloride SA (K-DUR,KLOR-CON) 20 MEQ tablet Take 2  tablets (40 mEq total) by mouth daily. For 5 days 11/15/14   Noland Fordyce, PA-C  thiamine 100 MG tablet Take 1 tablet (100 mg total) by mouth daily. 05/02/14   Modena Jansky, MD  zolpidem (AMBIEN) 10 MG tablet Take 10 mg by mouth at bedtime.     Historical Provider, MD   BP 108/61 mmHg  Pulse 93  Temp(Src) 97.5 F (36.4 C) (Oral)  Resp 18  SpO2 99% Physical Exam  Constitutional: He is oriented to person, place, and time. No distress.  HENT:  Head: Normocephalic and atraumatic.  Right Ear: External ear normal.  Left Ear: External ear normal.  Nose: Nose normal.  Eyes: Right eye exhibits no discharge. Left eye exhibits no discharge.  Neck: Neck supple.  Cardiovascular: Normal rate, regular rhythm, normal heart sounds and intact distal pulses.   Pulmonary/Chest: Effort normal and breath sounds normal.  Abdominal: Soft. He exhibits no distension. There is no tenderness.  Musculoskeletal: He exhibits no edema.       Right ankle: He exhibits no swelling. No tenderness.       Right foot: There is no tenderness and no swelling.  Neurological: He is alert and oriented to person, place, and time.  Skin: Skin is warm and dry. He is not diaphoretic.  Nursing note and vitals reviewed.   ED Course  Procedures (including critical care time) Labs Review Labs Reviewed  COMPREHENSIVE METABOLIC PANEL - Abnormal; Notable for the following:    Potassium 2.8 (*)    Glucose, Bld 140 (*)    BUN 47 (*)    Total Protein 5.3 (*)    Albumin 2.9 (*)    AST 53 (*)    Total Bilirubin 1.3 (*)    GFR calc non Af Amer 89 (*)    All other components within normal limits  CBC WITH DIFFERENTIAL - Abnormal; Notable for the following:    RBC 2.41 (*)    Hemoglobin 7.9 (*)    HCT 23.9 (*)    Platelets 73 (*)    Monocytes Relative 13 (*)    All other components within normal limits  RETICULOCYTES - Abnormal; Notable for the following:    Retic Ct Pct 3.5 (*)    RBC. 2.35 (*)    All other components  within normal limits  PROTIME-INR - Abnormal; Notable for the following:    Prothrombin Time 18.3 (*)    INR 1.50 (*)    All other components within normal limits  URINE CULTURE  MAGNESIUM  APTT  VITAMIN B12  FOLATE  IRON AND TIBC  FERRITIN  CBC  CBC  URINALYSIS, ROUTINE W REFLEX MICROSCOPIC  POC OCCULT BLOOD, ED  TYPE AND SCREEN  PREPARE RBC (CROSSMATCH)    Imaging Review No results found.   EKG Interpretation None  MDM   Final diagnoses:  Anemia, blood loss  Melena    Patient is well-appearing here and had a benign abdominal exam but does have new onset anemia of 7.9. Most recent hemoglobin 13 one month ago. Vital signs are stable, but with his melena and vomiting yesterday, will treat as if it upper GI bleed with Protonix, fluids, and transfuse if needed. Discussed with [name] of gastroenterology, who given the patient has no symptoms last 24 hours and no obvious signs of hematemesis will hold on ocreotide. Will admit to the hospitalist.     Ephraim Hamburger, MD 12/16/14 [id number]

## 2014-12-17 ENCOUNTER — Encounter (HOSPITAL_COMMUNITY)
Admission: EM | Disposition: A | Discharge status: Home / Self Care | Payer: Self-pay | Source: Home / Self Care | Attending: Internal Medicine

## 2014-12-17 ENCOUNTER — Encounter (HOSPITAL_COMMUNITY): Payer: Self-pay | Admitting: *Deleted

## 2014-12-17 DIAGNOSIS — C220 Liver cell carcinoma: Secondary | ICD-10-CM

## 2014-12-17 HISTORY — PX: ESOPHAGOGASTRODUODENOSCOPY: SHX5428

## 2014-12-17 LAB — TYPE AND SCREEN
ABO/RH(D): O POS
Antibody Screen: NEGATIVE
Unit division: 0
Unit division: 0

## 2014-12-17 LAB — COMPREHENSIVE METABOLIC PANEL
ALT: 32 U/L (ref 0–53)
AST: 43 U/L — ABNORMAL HIGH (ref 0–37)
Albumin: 3 g/dL — ABNORMAL LOW (ref 3.5–5.2)
Alkaline Phosphatase: 66 U/L (ref 39–117)
Anion gap: 6 (ref 5–15)
BUN: 33 mg/dL — ABNORMAL HIGH (ref 6–23)
CO2: 26 mmol/L (ref 19–32)
Calcium: 8.4 mg/dL (ref 8.4–10.5)
Chloride: 104 mEq/L (ref 96–112)
Creatinine, Ser: 0.86 mg/dL (ref 0.50–1.35)
GFR calc Af Amer: >90 mL/min (ref >90)
GFR calc non Af Amer: >90 mL/min (ref >90)
Glucose, Bld: 85 mg/dL (ref 70–99)
Potassium: 3.2 mmol/L — ABNORMAL LOW (ref 3.5–5.1)
Sodium: 136 mmol/L (ref 135–145)
Total Bilirubin: 2.4 mg/dL — ABNORMAL HIGH (ref 0.3–1.2)
Total Protein: 5.1 g/dL — ABNORMAL LOW (ref 6.0–8.3)

## 2014-12-17 LAB — CBC
HCT: 28.9 % — ABNORMAL LOW (ref 39.0–52.0)
HCT: 27.6 % — ABNORMAL LOW (ref 39.0–52.0)
Hemoglobin: 9.7 g/dL — ABNORMAL LOW (ref 13.0–17.0)
Hemoglobin: 9.4 g/dL — ABNORMAL LOW (ref 13.0–17.0)
MCH: 31.9 pg (ref 26.0–34.0)
MCH: 31.9 pg (ref 26.0–34.0)
MCHC: 33.6 g/dL (ref 30.0–36.0)
MCHC: 34.1 g/dL (ref 30.0–36.0)
MCV: 95.1 fL (ref 78.0–100.0)
MCV: 93.6 fL (ref 78.0–100.0)
Platelets: 55 10*3/uL — ABNORMAL LOW (ref 150–400)
Platelets: 53 10*3/uL — ABNORMAL LOW (ref 150–400)
RBC: 3.04 MIL/uL — ABNORMAL LOW (ref 4.22–5.81)
RBC: 2.95 MIL/uL — ABNORMAL LOW (ref 4.22–5.81)
RDW: 17 % — ABNORMAL HIGH (ref 11.5–15.5)
RDW: 17.1 % — ABNORMAL HIGH (ref 11.5–15.5)
WBC: 4 10*3/uL (ref 4.0–10.5)
WBC: 4.3 10*3/uL (ref 4.0–10.5)

## 2014-12-17 LAB — PROTIME-INR
INR: 1.4 (ref 0.00–1.49)
Prothrombin Time: 17.3 seconds — ABNORMAL HIGH (ref 11.6–15.2)

## 2014-12-17 SURGERY — EGD (ESOPHAGOGASTRODUODENOSCOPY)
Anesthesia: Moderate Sedation

## 2014-12-17 MED ORDER — PANTOPRAZOLE SODIUM 40 MG PO TBEC: 40.0000 mg | DELAYED_RELEASE_TABLET | Freq: Two times a day (BID) | ORAL | Status: DC

## 2014-12-17 MED ORDER — MIDAZOLAM HCL 10 MG/2ML IJ SOLN
INTRAMUSCULAR | Status: AC
  Filled 2014-12-17: qty 2

## 2014-12-17 MED ORDER — SODIUM CHLORIDE 0.9 % IV SOLN: INTRAVENOUS | Status: DC

## 2014-12-17 MED ORDER — FENTANYL CITRATE 0.05 MG/ML IJ SOLN
INTRAMUSCULAR | Status: DC | PRN
  Administered 2014-12-17 (×3): 25 ug via INTRAVENOUS

## 2014-12-17 MED ORDER — MIDAZOLAM HCL 10 MG/2ML IJ SOLN
INTRAMUSCULAR | Status: DC | PRN
  Administered 2014-12-17 (×3): 2 mg via INTRAVENOUS

## 2014-12-17 MED ORDER — DIPHENHYDRAMINE HCL 50 MG/ML IJ SOLN
INTRAMUSCULAR | Status: AC
  Filled 2014-12-17: qty 1

## 2014-12-17 MED ORDER — POTASSIUM CHLORIDE CRYS ER 20 MEQ PO TBCR: 40.0000 meq | EXTENDED_RELEASE_TABLET | Freq: Every day | ORAL | Status: DC

## 2014-12-17 MED ORDER — POTASSIUM CHLORIDE CRYS ER 20 MEQ PO TBCR
40.0000 meq | EXTENDED_RELEASE_TABLET | ORAL | Status: AC
  Administered 2014-12-17 (×2): 40 meq via ORAL
  Filled 2014-12-17 (×2): qty 2

## 2014-12-17 MED ORDER — DIPHENHYDRAMINE HCL 50 MG/ML IJ SOLN
INTRAMUSCULAR | Status: DC | PRN
  Administered 2014-12-17: 25 mg via INTRAVENOUS

## 2014-12-17 MED ORDER — FENTANYL CITRATE 0.05 MG/ML IJ SOLN
INTRAMUSCULAR | Status: AC
  Filled 2014-12-17: qty 2

## 2014-12-17 MED ORDER — BUTAMBEN-TETRACAINE-BENZOCAINE 2-2-14 % EX AERO
INHALATION_SPRAY | CUTANEOUS | Status: DC | PRN
  Administered 2014-12-17: 2 via TOPICAL

## 2014-12-17 NOTE — Consult Note (Signed)
[facility] Consult Note  Referring Provider: No ref. provider found Primary Care Physician:  [name],[name], MD Primary Gastroenterologist:  Dr.  Laurel Dimmer Complaint: Nausea vomiting and dark stools HPI: Jeffrey Snow is an 59 y.o. white male  with history of cirrhosis hepatitis C and hepatoma status post TACE and followed by Dr. Laurence Ferrari and [facility] hepatology, deemed not a transplant candidate but apparently with successful treatment of his hepatoma by follow-up imaging so far, he presents with nausea vomiting that was somewhat red that he had taken some red cough syrup. He also noted some black stools. His hemoglobin was 13 on December 2 and 7.9 on presentation yesterday with a BU and 47 and a normal creatinine. His potassium was low at 2.8 but is 3.2 after supplementation. Platelet count is 55,000. He received 2 units packed red blood cells and was given Protonix with a hemoglobin today of 9.7. He has never had an upper endoscopy to screen for varices and has no known history of upper GI bleeding. He states he feels fine this morning.  Past Medical History  Diagnosis Date  . Depression   . Anxiety   . Panic attack   . Hypertension   . Hepatitis C   . Claudication of left lower extremity m-1  . Abnormal MRI of abdomen, liver  04/21/2014  . Cirrhosis, alcoholic 12/21/1094  . Liver mass   . [place] (hepatocellular carcinoma)     Past Surgical History  Procedure Laterality Date  . Cosmetic surgery      facial reconstructive surgery  . Aortogram    . Abdominal aortagram N/A 12/27/2013    Procedure: ABDOMINAL Maxcine Ham;  Surgeon: Conrad [place], MD;  Location: [facility] CATH LAB;  Service: Cardiovascular;  Laterality: N/A;  . Lower extremity angiogram Bilateral 12/27/2013    Procedure: LOWER EXTREMITY ANGIOGRAM;  Surgeon: Conrad Springdale, MD;  Location: [facility] CATH LAB;  Service: Cardiovascular;  Laterality: Bilateral;    Medications Prior to Admission  Medication Sig  Dispense Refill  . ALPRAZolam (XANAX) 0.5 MG tablet Take 0.5 mg by mouth 3 (three) times daily as needed for anxiety.     Marland Kitchen aspirin EC 81 MG tablet Take 81 mg by mouth daily.    . benazepril (LOTENSIN) 40 MG tablet Take 40 mg by mouth every morning.    . bisacodyl (DULCOLAX) 5 MG EC tablet Take 5 mg by mouth daily as needed for mild constipation or moderate constipation.    . docusate sodium 100 MG CAPS Take 100 mg by mouth 2 (two) times daily. 20 capsule 0  . ergocalciferol (DRISDOL) 50000 UNITS capsule Take 1 capsule (50,000 Units total) by mouth once a week. 4 capsule 12  . FLUoxetine (PROZAC) 20 MG capsule Take 20 mg by mouth every morning.     . hydrochlorothiazide (HYDRODIURIL) 25 MG tablet Take 25 mg by mouth every morning.    Marland Kitchen spironolactone (ALDACTONE) 25 MG tablet Take 25 mg by mouth daily.    Marland Kitchen thiamine 100 MG tablet Take 1 tablet (100 mg total) by mouth daily. 30 tablet 0  . zolpidem (AMBIEN) 10 MG tablet Take 10 mg by mouth at bedtime.     . folic acid (FOLVITE) 1 MG tablet Take 1 tablet (1 mg total) by mouth daily. (Patient not taking: Reported on 11/15/2014) 30 tablet 11  . oxycodone (OXY-IR) 5 MG capsule Take 1 capsule (5 mg total) by mouth every 4 (four) hours as needed. (Patient not taking: Reported on 11/15/2014)  30 capsule 0  . potassium chloride SA (K-DUR,KLOR-CON) 20 MEQ tablet Take 2 tablets (40 mEq total) by mouth daily. For 5 days (Patient not taking: Reported on 12/16/2014) 5 tablet 0    Allergies:  Allergies  Allergen Reactions  . Folic Acid Other (See Comments)    Makes stomach feel funny    Family History  Problem Relation Age of Onset  . Diabetes Father   . Diabetes Brother   . Diabetes Brother     Social History:  reports that he quit smoking about 3 years ago. He has never used smokeless tobacco. He reports that he drinks about 3.6 oz of alcohol per week. He reports that he does not use illicit drugs.  Review of Systems: negative except as above and  chronic right foot pain   Blood pressure 102/46, pulse 64, temperature 97.8 F (36.6 C), temperature source Oral, resp. rate 18, height _0  (1.727 m), weight 58.786 kg (129 lb 9.6 oz), SpO2 100 %. Head: Normocephalic, without obvious abnormality, atraumatic Neck: no adenopathy, no carotid bruit, no JVD, supple, symmetrical, trachea midline and thyroid not enlarged, symmetric, no tenderness/mass/nodules Resp: clear to auscultation bilaterally Cardio: regular rate and rhythm, S1, S2 normal, no murmur, click, rub or gallop GI: Abdomen soft nondistended with normoactive bowel sounds. No hepatomegaly masses or guarding Extremities: extremities normal, atraumatic, no cyanosis or edema  Results for orders placed or performed during the hospital encounter of 12/16/14 (from the past 48 hour(s))  Comprehensive metabolic panel     Status: Abnormal   Collection Time: 12/16/14 11:40 AM  Result Value Ref Range   Sodium 135 135 - 145 mmol/L    Comment: Please note change in reference range.   Potassium 2.8 (L) 3.5 - 5.1 mmol/L    Comment: Please note change in reference range.   Chloride 101 96 - 112 mEq/L   CO2 25 19 - 32 mmol/L   Glucose, Bld 140 (H) 70 - 99 mg/dL   BUN 47 (H) 6 - 23 mg/dL   Creatinine, Ser 0.96 0.50 - 1.35 mg/dL   Calcium 8.9 8.4 - 10.5 mg/dL   Total Protein 5.3 (L) 6.0 - 8.3 g/dL   Albumin 2.9 (L) 3.5 - 5.2 g/dL   AST 53 (H) 0 - 37 U/L   ALT 32 0 - 53 U/L   Alkaline Phosphatase 69 39 - 117 U/L   Total Bilirubin 1.3 (H) 0.3 - 1.2 mg/dL   GFR calc non Af Amer 89 (L) >90 mL/min   GFR calc Af Amer >90 >90 mL/min    Comment: (NOTE) The eGFR has been calculated using the CKD EPI equation. This calculation has not been validated in all clinical situations. eGFR's persistently <90 mL/min signify possible Chronic Kidney Disease.    Anion gap 9 5 - 15  CBC with Differential     Status: Abnormal   Collection Time: 12/16/14 11:40 AM  Result Value Ref Range   WBC 6.6 4.0 - 10.5  K/uL   RBC 2.41 (L) 4.22 - 5.81 MIL/uL   Hemoglobin 7.9 (L) 13.0 - 17.0 g/dL   HCT 23.9 (L) 39.0 - 52.0 %   MCV 99.2 78.0 - 100.0 fL   MCH 32.8 26.0 - 34.0 pg   MCHC 33.1 30.0 - 36.0 g/dL   RDW 15.2 11.5 - 15.5 %   Platelets 73 (L) 150 - 400 K/uL    Comment: REPEATED TO VERIFY SPECIMEN CHECKED FOR CLOTS PLATELET COUNT CONFIRMED BY SMEAR  Neutrophils Relative % 72 43 - 77 %   Lymphocytes Relative 13 12 - 46 %   Monocytes Relative 13 (H) 3 - 12 %   Eosinophils Relative 2 0 - 5 %   Basophils Relative 0 0 - 1 %   Neutro Abs 4.7 1.7 - 7.7 K/uL   Lymphs Abs 0.9 0.7 - 4.0 K/uL   Monocytes Absolute 0.9 0.1 - 1.0 K/uL   Eosinophils Absolute 0.1 0.0 - 0.7 K/uL   Basophils Absolute 0.0 0.0 - 0.1 K/uL   Smear Review MORPHOLOGY UNREMARKABLE   Protime-INR     Status: Abnormal   Collection Time: 12/16/14 12:44 PM  Result Value Ref Range   Prothrombin Time 18.3 (H) 11.6 - 15.2 seconds   INR 1.50 (H) 0.00 - 1.49  APTT     Status: None   Collection Time: 12/16/14 12:44 PM  Result Value Ref Range   aPTT 31 24 - 37 seconds  Type and screen     Status: None (Preliminary result)   Collection Time: 12/16/14 12:47 PM  Result Value Ref Range   ABO/RH(D) O POS    Antibody Screen NEG    Sample Expiration 12/19/2014    Unit Number [id number]    Blood Component Type RBC LR PHER1    Unit division 00    Status of Unit ISSUED    Transfusion Status OK TO TRANSFUSE    Crossmatch Result Compatible    Unit Number [id number]    Blood Component Type RED CELLS,LR    Unit division 00    Status of Unit ISSUED    Transfusion Status OK TO TRANSFUSE    Crossmatch Result Compatible   Magnesium     Status: None   Collection Time: 12/16/14  1:31 PM  Result Value Ref Range   Magnesium 1.9 1.5 - 2.5 mg/dL  Reticulocytes     Status: Abnormal   Collection Time: 12/16/14  1:31 PM  Result Value Ref Range   Retic Ct Pct 3.5 (H) 0.4 - 3.1 %   RBC. 2.35 (L) 4.22 - 5.81 MIL/uL   Retic Count, Manual 82.3  19.0 - 186.0 K/uL  Prepare RBC     Status: None   Collection Time: 12/16/14  3:00 PM  Result Value Ref Range   Order Confirmation ORDER PROCESSED BY BLOOD BANK   CBC     Status: Abnormal   Collection Time: 12/16/14  5:44 PM  Result Value Ref Range   WBC 5.3 4.0 - 10.5 K/uL   RBC 2.16 (L) 4.22 - 5.81 MIL/uL   Hemoglobin 7.0 (L) 13.0 - 17.0 g/dL   HCT 21.4 (L) 39.0 - 52.0 %   MCV 99.1 78.0 - 100.0 fL   MCH 32.4 26.0 - 34.0 pg   MCHC 32.7 30.0 - 36.0 g/dL   RDW 15.1 11.5 - 15.5 %   Platelets 60 (L) 150 - 400 K/uL    Comment: RESULT REPEATED AND VERIFIED SPECIMEN CHECKED FOR CLOTS CONSISTENT WITH PREVIOUS RESULT   Urinalysis, Routine w reflex microscopic     Status: None   Collection Time: 12/16/14  5:49 PM  Result Value Ref Range   Color, Urine YELLOW YELLOW   APPearance CLEAR CLEAR   Specific Gravity, Urine 1.018 1.005 - 1.030   pH 8.0 5.0 - 8.0   Glucose, UA NEGATIVE NEGATIVE mg/dL   Hgb urine dipstick NEGATIVE NEGATIVE   Bilirubin Urine NEGATIVE NEGATIVE   Ketones, ur NEGATIVE NEGATIVE mg/dL   Protein, ur NEGATIVE NEGATIVE  mg/dL   Urobilinogen, UA 1.0 0.0 - 1.0 mg/dL   Nitrite NEGATIVE NEGATIVE   Leukocytes, UA NEGATIVE NEGATIVE    Comment: MICROSCOPIC NOT DONE ON URINES WITH NEGATIVE PROTEIN, BLOOD, LEUKOCYTES, NITRITE, OR GLUCOSE <1000 mg/dL.  CBC     Status: Abnormal   Collection Time: 12/17/14  5:35 AM  Result Value Ref Range   WBC 4.0 4.0 - 10.5 K/uL   RBC 3.04 (L) 4.22 - 5.81 MIL/uL   Hemoglobin 9.7 (L) 13.0 - 17.0 g/dL    Comment: DELTA CHECK NOTED POST TRANSFUSION SPECIMEN    HCT 28.9 (L) 39.0 - 52.0 %   MCV 95.1 78.0 - 100.0 fL   MCH 31.9 26.0 - 34.0 pg   MCHC 33.6 30.0 - 36.0 g/dL   RDW 17.0 (H) 11.5 - 15.5 %   Platelets 55 (L) 150 - 400 K/uL    Comment: CONSISTENT WITH PREVIOUS RESULT  Comprehensive metabolic panel     Status: Abnormal   Collection Time: 12/17/14  5:35 AM  Result Value Ref Range   Sodium 136 135 - 145 mmol/L    Comment: Please note  change in reference range.   Potassium 3.2 (L) 3.5 - 5.1 mmol/L    Comment: Please note change in reference range.   Chloride 104 96 - 112 mEq/L   CO2 26 19 - 32 mmol/L   Glucose, Bld 85 70 - 99 mg/dL   BUN 33 (H) 6 - 23 mg/dL   Creatinine, Ser 0.86 0.50 - 1.35 mg/dL   Calcium 8.4 8.4 - 10.5 mg/dL   Total Protein 5.1 (L) 6.0 - 8.3 g/dL   Albumin 3.0 (L) 3.5 - 5.2 g/dL   AST 43 (H) 0 - 37 U/L   ALT 32 0 - 53 U/L   Alkaline Phosphatase 66 39 - 117 U/L   Total Bilirubin 2.4 (H) 0.3 - 1.2 mg/dL   GFR calc non Af Amer >90 >90 mL/min   GFR calc Af Amer >90 >90 mL/min    Comment: (NOTE) The eGFR has been calculated using the CKD EPI equation. This calculation has not been validated in all clinical situations. eGFR's persistently <90 mL/min signify possible Chronic Kidney Disease.    Anion gap 6 5 - 15  Protime-INR     Status: Abnormal   Collection Time: 12/17/14  5:35 AM  Result Value Ref Range   Prothrombin Time 17.3 (H) 11.6 - 15.2 seconds   INR 1.40 0.00 - 1.49   No results found.  Assessment: Anemia with melena and possible hematemesis and known cirrhotic with hepatoma, currently looks stable. Plan:  PPI started and patient transfused. We'll proceed with EGD this morning. Jaki Steptoe C 12/17/2014, 8:40 AM

## 2014-12-17 NOTE — Progress Notes (Signed)
[facility] PROGRESS NOTE  Jeffrey Snow EXB:[id number] DOB: October 18, 1956 DOA: 12/16/2014 PCP: [name],[name], MD  Assessment/Plan: #1.Anemia/ABLA Concern for UGIB. Patient with elevated BUN, history of ETOH cirrhosis, presenting with melanotic stools and ?? Hemetemesis. Patient has not been screened for varices. Hgb on admission was 7.9 from 13.3 on 11/15/14. Patient's hemoglobin dropped as low as 7.0 during this hospitalization. Patient is status post 2 units packed red blood cells with hemoglobin at 9.7 this morning. Patient denies any further melanotic stools or emesis. Patient has been seen by gastroenterology and patient for EGD this morning. Continue PPI. Follow H&H.  #2 melanotic stool/probable upper GI bleed See problem #1.  #3 depression/anxiety Stable. Continue home regimen of Prozac and Xanax.  #4 hypokalemia Likely secondary to diuretics and GI loss. Magnesium was 1.9. Replete.  #5 hypertension Stable. Continue home regimen of benazepril, HCTZ, Aldactone.  #6 history of alcoholic cirrhosis Compensated. Stable. Continue spironolactone. Monitor fluid status. Patient for EGD today as patient has never been assess for varices. Follow.  #7 history of hepatocellular carcinoma/hepatitis C stable. Patient follow-up.  #8 thrombocytopenia Likely secondary to alcoholic cirrhosis. Platelet count of 55. Follow.  #9 prophylaxis PPI for GI prophylaxis. SCDs for DVT prophylaxis.   Code Status: Full Family Communication: updated patient. No family present. Disposition Plan: Remain inpatient.   Consultants:  GI: Dr Amedeo Plenty 12/17/13  Procedures:  2 UNITS PRBCs1/1/15  Antibiotics:  None  HPI/Subjective: Patient denies any further nausea or emesis. No melanotic stools.  Objective: Filed Vitals:   12/17/14 0548  BP: 102/46  Pulse: 64  Temp: 97.8 F (36.6 C)  Resp: 18    Intake/Output Summary (Last 24 hours) at 12/17/14 0917 Last data filed at 12/17/14  0552  Gross per 24 hour  Intake    900 ml  Output    775 ml  Net    125 ml   Filed Weights   12/16/14 1500 12/17/14 0548  Weight: 58.605 kg (129 lb 3.2 oz) 58.786 kg (129 lb 9.6 oz)    Exam:   General:  NAD  Cardiovascular: RRR  Respiratory: CTAB  Abdomen: Soft/NT/ND/+BS  Musculoskeletal: No c/c/e  Data Reviewed: Basic Metabolic Panel:  Recent Labs Lab 12/16/14 1140 12/16/14 1331 12/17/14 0535  NA 135  --  136  K 2.8*  --  3.2*  CL 101  --  104  CO2 25  --  26  GLUCOSE 140*  --  85  BUN 47*  --  33*  CREATININE 0.96  --  0.86  CALCIUM 8.9  --  8.4  MG  --  1.9  --    Liver Function Tests:  Recent Labs Lab 12/16/14 1140 12/17/14 0535  AST 53* 43*  ALT 32 32  ALKPHOS 69 66  BILITOT 1.3* 2.4*  PROT 5.3* 5.1*  ALBUMIN 2.9* 3.0*   No results for input(s): LIPASE, AMYLASE in the last 168 hours. No results for input(s): AMMONIA in the last 168 hours. CBC:  Recent Labs Lab 12/16/14 1140 12/16/14 1744 12/17/14 0535  WBC 6.6 5.3 4.0  NEUTROABS 4.7  --   --   HGB 7.9* 7.0* 9.7*  HCT 23.9* 21.4* 28.9*  MCV 99.2 99.1 95.1  PLT 73* 60* 55*   Cardiac Enzymes: No results for input(s): CKTOTAL, CKMB, CKMBINDEX, TROPONINI in the last 168 hours. BNP (last 3 results) No results for input(s): PROBNP in the last 8760 hours. CBG: No results for input(s): GLUCAP in the last 168 hours.  No results  found for this or any previous visit (from the past 240 hour(s)).   Studies: No results found.  Scheduled Meds: . antiseptic oral rinse  7 mL Mouth Rinse BID  . benazepril  40 mg Oral q morning - 10a  . docusate sodium  100 mg Oral BID  . FLUoxetine  20 mg Oral q morning - 10a  . hydrochlorothiazide  25 mg Oral q morning - 10a  . Influenza vac split quadrivalent PF  0.5 mL Intramuscular Tomorrow-1000  . pantoprazole (PROTONIX) IV  40 mg Intravenous Q12H  . potassium chloride  40 mEq Oral Once  . potassium chloride  40 mEq Oral Q4H  . sodium chloride  3 mL  Intravenous Q12H  . spironolactone  25 mg Oral Daily  . thiamine  100 mg Oral Daily  . zolpidem  10 mg Oral QHS   Continuous Infusions:   Principal Problem:   Anemia, blood loss Active Problems:   Thrombocytopenia   Pain in limb   Depression   PAD (peripheral artery disease)   PVD (peripheral vascular disease)   Vitamin D deficiency disease   HTN (hypertension)   Cirrhosis, alcoholic   Hypokalemia   Hepatitis C   HCC (hepatocellular carcinoma)   Melena   Anemia    Time spent: 74 mins    [facility] MD [facility] Pager [phone]. If 7PM-7AM, please contact night-coverage at [email or web address], password [facility] 12/17/2014, 9:17 AM  LOS: 1 day

## 2014-12-17 NOTE — Progress Notes (Signed)
Pt has been insisting to be discharge when he came back from endoscopy. Explain to the pt that [name] wants him to stay 1 more day & that he wont be discharge today. Pt still wants to go & told him he could sign a form that he's leaving AMA. Dr. Grandville Silos aware & seen the pt. Pt did not wait for the discharge papers & left right away but he got the prescription.

## 2014-12-17 NOTE — Discharge Summary (Signed)
Physician Discharge Summary  Jeffrey Snow WRU:[id number] DOB: Jul 21, 1956 DOA: 12/16/2014  PCP: [name],[name], MD  Admit date: 12/16/2014 Discharge date: 12/17/2014   Patient was very insistent/adamant on being discharged although it was explained to him that it was recommended by GI that patient remain in-house to ensure that his hemoglobin stabilizes and has no further bleeding. She was very insistent on leaving and stated would leave [place]. Patient was subsequently discharged on PPI twice daily. Patient was instructed to follow-up with PCP closely.   Time spent: 65  minutes  Recommendations for Outpatient Follow-up:  1. Follow-up with [name],[name], MD in 1 week. On follow-up patient in need a basic metabolic profile done to follow-up on his electrolytes and renal function. Patient also need a CBC done to follow-up on his hemoglobin. 2.   Discharge Diagnoses:  Principal Problem:   Anemia, blood loss Active Problems:   Thrombocytopenia   Pain in limb   Depression   PAD (peripheral artery disease)   PVD (peripheral vascular disease)   Vitamin D deficiency disease   HTN (hypertension)   Cirrhosis, alcoholic   Hypokalemia   Hepatitis C   HCC (hepatocellular carcinoma)   Melena   Anemia   Discharge Condition: Stable  Diet recommendation: Heart healthy  Filed Weights   12/16/14 1500 12/17/14 0548  Weight: 58.605 kg (129 lb 3.2 oz) 58.786 kg (129 lb 9.6 oz)    History of present illness:  Jeffrey Snow is a 59 y.o. male  With history of depression/anxiety, hepatitis C, chronic right foot pain, history of hepatocellular carcinoma, alcoholic cirrhosis, hypertension who presented to the ED with a one-day history of nausea emesis and 2-3 melanotic bowel movements. Patient stated that he ate a hot dog 1 day prior to admission which was a couple days old and had an episode of red emesis but stated that he is just taking some red colored cough  syrup. Patient stated also had 2-3 bowel movements that were black. Patient denies any further nausea vomiting or melanotic stools today. Patient denies any fever, no chills, no shortness of breath, no chest pain, no cough, no abdominal pain, no dysuria, no weakness, no hematochezia, no further hematemesis. Patient denies any use of NSAIDs. Patient states he's on a on a baby aspirin. Patient also complaining of right foot pain that has been ongoing for several years now. Patient was seen in the emergency room compressive metabolic profile done had a potassium of 2.8 BUN of 47 albumin of 2.9 AST of 53 bilirubin of 1.3 otherwise was within normal limits. CBC done had a hemoglobin of 7.9 which was 13.3 on 11/15/2014. Patient also had a platelet count of 73. [facility] was called to admit the patient for further evaluation and management  Hospital Course:  #1.Anemia/ABLA Likely secondary to upper GI bleed. Patient on admission with elevated BUN, history of ETOH cirrhosis, presenting with melanotic stools and ?? Hemetemesis. Patient has not been screened for varices. Hgb on admission was 7.9 from 13.3 on 11/15/14. Patient's hemoglobin dropped as low as 7.0 during this hospitalization. Patient is status post 2 units packed red blood cells with hemoglobin at 9.7 on day of discharge.  Patient denied any further melanotic stools or emesis. Patient was seen by gastroenterology and patient underwent upper endoscopy on 12/17/2014 which was negative for esophageal varices on EGD but friable mucosa throughout the body and antrum as well as in the [place] no with one small duodenal erosion with no stigmata  of hemorrhage. Patient was maintained on PPI IV twice daily. An H pylori antibody was also obtained. It was recommended by GI that patient remain in-house for 1 more night to make sure his hemoglobin remains stable. Patient was insistent/very adamant on being discharged on the day of admission although it was  explained to him that he needed to stay one extra day to make sure he doesn't have any further bleeding or drop in his hemoglobin. Patient stated that he was leaving with or without our permission. Due to patient's poor compliance patient was discharged on a PPI twice daily. Patient only to follow-up with PCP as outpatient for follow-up labs.  #2 melanotic stool/probable upper GI bleed See problem #1.  #3 depression/anxiety Stable. Continued on home regimen of Prozac and Xanax.  #4 hypokalemia Likely secondary to diuretics and GI loss. Magnesium was 1.9. Repleted. Patient was discharged on 5 days of daily potassium supplementation. Patient is to follow-up with PCP as outpatient..  #5 hypertension Stable. Continued on home regimen of benazepril, HCTZ, Aldactone.  #6 history of alcoholic cirrhosis Compensated. Stable. Continued on home regimen of spironolactone. Patient underwent upper endoscopy which was negative for any esophageal varices.   #7 history of hepatocellular carcinoma/hepatitis C stable. Outpatient follow-up.  #8 thrombocytopenia Likely secondary to alcoholic cirrhosis. Platelet count of 55. Outpatient follow-up. Follow.  Procedures:  2 UNITS PRBCs1/1/15  Upper endoscopy 12/17/2014  Consultations:  GI: Dr Amedeo Plenty 12/17/13  Discharge Exam: Filed Vitals:   12/17/14 1438  BP: 96/46  Pulse: 66  Temp:   Resp:     General: NAD Cardiovascular: RRR Respiratory: CTAB  Discharge Instructions   Discharge Instructions    Diet - low sodium heart healthy    Complete by:  As directed      Discharge instructions    Complete by:  As directed   Follow up with [name],[name], MD in 1 week.     Increase activity slowly    Complete by:  As directed           Current Discharge Medication List    START taking these medications   Details  pantoprazole (PROTONIX) 40 MG tablet Take 1 tablet (40 mg total) by mouth 2 (two) times daily before a meal. Qty: 62 tablet,  Refills: 0      CONTINUE these medications which have CHANGED   Details  potassium chloride SA (K-DUR,KLOR-CON) 20 MEQ tablet Take 2 tablets (40 mEq total) by mouth daily. For 5 days Qty: 10 tablet, Refills: 0      CONTINUE these medications which have NOT CHANGED   Details  ALPRAZolam (XANAX) 0.5 MG tablet Take 0.5 mg by mouth 3 (three) times daily as needed for anxiety.     benazepril (LOTENSIN) 40 MG tablet Take 40 mg by mouth every morning.    bisacodyl (DULCOLAX) 5 MG EC tablet Take 5 mg by mouth daily as needed for mild constipation or moderate constipation.    docusate sodium 100 MG CAPS Take 100 mg by mouth 2 (two) times daily. Qty: 20 capsule, Refills: 0    ergocalciferol (DRISDOL) 50000 UNITS capsule Take 1 capsule (50,000 Units total) by mouth once a week. Qty: 4 capsule, Refills: 12   Associated Diagnoses: Unspecified vitamin D deficiency    FLUoxetine (PROZAC) 20 MG capsule Take 20 mg by mouth every morning.     hydrochlorothiazide (HYDRODIURIL) 25 MG tablet Take 25 mg by mouth every morning.    spironolactone (ALDACTONE) 25 MG tablet Take 25  mg by mouth daily.    thiamine 100 MG tablet Take 1 tablet (100 mg total) by mouth daily. Qty: 30 tablet, Refills: 0    zolpidem (AMBIEN) 10 MG tablet Take 10 mg by mouth at bedtime.    Associated Diagnoses: Thrombocytopenia    folic acid (FOLVITE) 1 MG tablet Take 1 tablet (1 mg total) by mouth daily. Qty: 30 tablet, Refills: 11   Associated Diagnoses: Alcoholic cirrhosis of liver without ascites    oxycodone (OXY-IR) 5 MG capsule Take 1 capsule (5 mg total) by mouth every 4 (four) hours as needed. Qty: 30 capsule, Refills: 0   Associated Diagnoses: HCC (hepatocellular carcinoma); Abdominal pain, chronic, right upper quadrant      STOP taking these medications     aspirin EC 81 MG tablet        Allergies  Allergen Reactions  . Folic Acid Other (See Comments)    Makes stomach feel funny   Follow-up Information     Follow up with [name],[name], MD. Schedule an appointment as soon as possible for a visit in 1 week.   Specialty:  Internal Medicine   Contact information:   [place] [address] [phone]        The results of significant diagnostics from this hospitalization (including imaging, microbiology, ancillary and laboratory) are listed below for reference.    Significant Diagnostic Studies: No results found.  Microbiology: No results found for this or any previous visit (from the past 240 hour(s)).   Labs: Basic Metabolic Panel:  Recent Labs Lab 12/16/14 1140 12/16/14 1331 12/17/14 0535  NA 135  --  136  K 2.8*  --  3.2*  CL 101  --  104  CO2 25  --  26  GLUCOSE 140*  --  85  BUN 47*  --  33*  CREATININE 0.96  --  0.86  CALCIUM 8.9  --  8.4  MG  --  1.9  --    Liver Function Tests:  Recent Labs Lab 12/16/14 1140 12/17/14 0535  AST 53* 43*  ALT 32 32  ALKPHOS 69 66  BILITOT 1.3* 2.4*  PROT 5.3* 5.1*  ALBUMIN 2.9* 3.0*   No results for input(s): LIPASE, AMYLASE in the last 168 hours. No results for input(s): AMMONIA in the last 168 hours. CBC:  Recent Labs Lab 12/16/14 1140 12/16/14 1744 12/17/14 0535 12/17/14 1237  WBC 6.6 5.3 4.0 4.3  NEUTROABS 4.7  --   --   --   HGB 7.9* 7.0* 9.7* 9.4*  HCT 23.9* 21.4* 28.9* 27.6*  MCV 99.2 99.1 95.1 93.6  PLT 73* 60* 55* 53*   Cardiac Enzymes: No results for input(s): CKTOTAL, CKMB, CKMBINDEX, TROPONINI in the last 168 hours. BNP: BNP (last 3 results) No results for input(s): PROBNP in the last 8760 hours. CBG: No results for input(s): GLUCAP in the last 168 hours.     SignedIrine Seal MD [facility] 12/17/2014, 3:22 PM

## 2014-12-17 NOTE — Progress Notes (Signed)
Addendum: No esophageal varices on EGD no active bleeding but friable mucosa throughout the body and antrum as well as in the duodenum with one small duodenal erosion with no stigmata of hemorrhage. Would treat with proton pump inhibitor and we'll obtain H. pylori antibody. Would probably hold and hospital 1 more night and recheck CBC in the a.m.

## 2014-12-17 NOTE — Progress Notes (Signed)
PT/OT Cancellation Note  Patient Details Name: Jeffrey Snow MRN: [id number] DOB: [birth year]   Cancelled Treatment:    Reason Eval/Treat Not Completed: Patient at procedure or test/unavailable; Pt in Endo this     am, will see for PT/OT evals at later time as schedule permits;  Kenyon Ana, PT Pager: [phone] 12/17/2014   Kenyon Ana 12/17/2014, 9:25 AM

## 2014-12-17 NOTE — H&P (View-Only) (Signed)
PT/OT Cancellation Note  Patient Details Name: Jeffrey Snow MRN: [id number] DOB: [birth year]   Cancelled Treatment:    Reason Eval/Treat Not Completed: Patient at procedure or test/unavailable; Pt in Endo this     am, will see for PT/OT evals at later time as schedule permits;  Kenyon Ana, PT Pager: [phone] 12/17/2014   Kenyon Ana 12/17/2014, 9:25 AM

## 2014-12-17 NOTE — Op Note (Signed)
[name], [id number]   ENDOSCOPY PROCEDURE REPORT  PATIENT: Jeffrey Snow, Jeffrey Snow  MR#: [id number] BIRTHDATE: 1956-10-04 , 58  yrs. old GENDER: male ENDOSCOPIST: Teena Irani, MD REFERRED BY: PROCEDURE DATE:  2015-01-02 PROCEDURE: ASA CLASS: INDICATIONS:  GI bleeding MEDICATIONS: fentanyl 75 g Versed 6 mg Benadryl 25 mg TOPICAL ANESTHETIC:  DESCRIPTION OF PROCEDURE: After the risks benefits and alternatives of the procedure were thoroughly explained, informed consent was obtained.  The Pentax Gastroscope [id number] endoscope was introduced through the mouth and advanced to the second portion of the duodenum , Without limitations.  The instrument was slowly withdrawn as the mucosa was fully examined.    no esophageal varices. Prominent normal squamocolumnar align with small hiatal hernia Stomach: Diffuse erythema granularity and friability consistent with gastritis, no focal ulcers no gastric varices or AVMs seen. Pylorus is nondeformed Duodenum friable first portion with one erosion without exudate. Second portion normal              The scope was then withdrawn from the patient and the procedure completed.  COMPLICATIONS: There were no immediate complications.  ENDOSCOPIC IMPRESSION: no esophageal or gastric varices. Small hiatal hernia with diffuse gastritis no focal bleeding lesion Duodenum mild duodenitis with one duodenal erosion no active bleeding  RECOMMENDATIONS: continue PPI Monitor stools and hemoglobin Avoid NSAIDs Check H. pylori status possibly home tomorrow  REPEAT EXAM:  eSigned:  Teena Irani, MD 02-Jan-2015 10:07 AM    CC:  CPT CODES: ICD CODES:  The ICD and CPT codes recommended by this software are interpretations from the data that the clinical staff has captured with the software.  The verification of the translation of this report to the ICD and CPT codes and modifiers is the sole responsibility  of the health care institution and practicing physician where this report was generated.  [employer] will not be held responsible for the validity of the ICD and CPT codes included on this report.  AMA assumes no liability for data contained or not contained herein. CPT is a Designer, television/film set of the [employer].

## 2014-12-17 NOTE — Interval H&P Note (Signed)
History and Physical Interval Note:  12/17/2014 9:36 AM  Jeffrey Snow  has presented today for surgery, with the diagnosis of melena  The various methods of treatment have been discussed with the patient and family. After consideration of risks, benefits and other options for treatment, the patient has consented to  Procedure(s): ESOPHAGOGASTRODUODENOSCOPY (EGD) (N/A) as a surgical intervention .  The patient's history has been reviewed, patient examined, no change in status, stable for surgery.  I have reviewed the patient's chart and labs.  Questions were answered to the patient's satisfaction.     Joal Eakle C

## 2014-12-18 LAB — URINE CULTURE: Colony Count: 3000

## 2014-12-18 LAB — IRON AND TIBC
Iron: 190 ug/dL — ABNORMAL HIGH (ref 42–165)
UIBC: <15 ug/dL — ABNORMAL LOW (ref 125–400)

## 2014-12-18 LAB — FOLATE: Folate: 18.1 ng/mL

## 2014-12-18 LAB — VITAMIN B12: Vitamin B-12: 529 pg/mL (ref 211–911)

## 2014-12-18 LAB — FERRITIN: Ferritin: 492 ng/mL — ABNORMAL HIGH (ref 22–322)

## 2014-12-19 ENCOUNTER — Encounter (HOSPITAL_COMMUNITY): Payer: Self-pay | Admitting: Gastroenterology

## 2014-12-19 LAB — OCCULT BLOOD, POC DEVICE: Fecal Occult Bld: POSITIVE — AB

## 2014-12-21 ENCOUNTER — Ambulatory Visit (INDEPENDENT_AMBULATORY_CARE_PROVIDER_SITE_OTHER): Payer: Medicaid Other

## 2014-12-21 ENCOUNTER — Other Ambulatory Visit: Payer: Self-pay | Admitting: Internal Medicine

## 2014-12-21 DIAGNOSIS — Z23 Encounter for immunization: Secondary | ICD-10-CM

## 2014-12-31 ENCOUNTER — Encounter (HOSPITAL_COMMUNITY): Payer: Self-pay | Admitting: *Deleted

## 2014-12-31 ENCOUNTER — Emergency Department (HOSPITAL_COMMUNITY)
Admission: EM | Admit: 2014-12-31 | Discharge: 2014-12-31 | Disposition: A | Discharge status: Home / Self Care | Payer: Medicaid Other | Attending: Emergency Medicine | Admitting: Emergency Medicine

## 2014-12-31 ENCOUNTER — Emergency Department (HOSPITAL_COMMUNITY): Payer: Medicaid Other

## 2014-12-31 DIAGNOSIS — Z8589 Personal history of malignant neoplasm of other organs and systems: Secondary | ICD-10-CM | POA: Insufficient documentation

## 2014-12-31 DIAGNOSIS — Z79899 Other long term (current) drug therapy: Secondary | ICD-10-CM | POA: Insufficient documentation

## 2014-12-31 DIAGNOSIS — Z8719 Personal history of other diseases of the digestive system: Secondary | ICD-10-CM | POA: Insufficient documentation

## 2014-12-31 DIAGNOSIS — I10 Essential (primary) hypertension: Secondary | ICD-10-CM | POA: Insufficient documentation

## 2014-12-31 DIAGNOSIS — F410 Panic disorder [episodic paroxysmal anxiety] without agoraphobia: Secondary | ICD-10-CM | POA: Diagnosis not present

## 2014-12-31 DIAGNOSIS — Z87891 Personal history of nicotine dependence: Secondary | ICD-10-CM | POA: Insufficient documentation

## 2014-12-31 DIAGNOSIS — F329 Major depressive disorder, single episode, unspecified: Secondary | ICD-10-CM | POA: Insufficient documentation

## 2014-12-31 DIAGNOSIS — R140 Abdominal distension (gaseous): Secondary | ICD-10-CM

## 2014-12-31 DIAGNOSIS — D696 Thrombocytopenia, unspecified: Secondary | ICD-10-CM | POA: Diagnosis not present

## 2014-12-31 DIAGNOSIS — R188 Other ascites: Secondary | ICD-10-CM | POA: Insufficient documentation

## 2014-12-31 DIAGNOSIS — R609 Edema, unspecified: Secondary | ICD-10-CM

## 2014-12-31 DIAGNOSIS — D539 Nutritional anemia, unspecified: Secondary | ICD-10-CM | POA: Diagnosis not present

## 2014-12-31 DIAGNOSIS — Z8619 Personal history of other infectious and parasitic diseases: Secondary | ICD-10-CM | POA: Insufficient documentation

## 2014-12-31 DIAGNOSIS — D649 Anemia, unspecified: Secondary | ICD-10-CM

## 2014-12-31 LAB — CBC WITH DIFFERENTIAL/PLATELET
Basophils Absolute: 0 10*3/uL (ref 0.0–0.1)
Basophils Relative: 0 % (ref 0–1)
Eosinophils Absolute: 0.2 10*3/uL (ref 0.0–0.7)
Eosinophils Relative: 4 % (ref 0–5)
HCT: 34.9 % — ABNORMAL LOW (ref 39.0–52.0)
Hemoglobin: 11.2 g/dL — ABNORMAL LOW (ref 13.0–17.0)
Lymphocytes Relative: 23 % (ref 12–46)
Lymphs Abs: 0.9 10*3/uL (ref 0.7–4.0)
MCH: 31.4 pg (ref 26.0–34.0)
MCHC: 32.1 g/dL (ref 30.0–36.0)
MCV: 97.8 fL (ref 78.0–100.0)
Monocytes Absolute: 0.5 10*3/uL (ref 0.1–1.0)
Monocytes Relative: 12 % (ref 3–12)
Neutro Abs: 2.3 10*3/uL (ref 1.7–7.7)
Neutrophils Relative %: 61 % (ref 43–77)
Platelets: 64 10*3/uL — ABNORMAL LOW (ref 150–400)
RBC: 3.57 MIL/uL — ABNORMAL LOW (ref 4.22–5.81)
RDW: 15.9 % — ABNORMAL HIGH (ref 11.5–15.5)
WBC: 3.9 10*3/uL — ABNORMAL LOW (ref 4.0–10.5)

## 2014-12-31 LAB — LIPASE, BLOOD: Lipase: 28 U/L (ref 11–59)

## 2014-12-31 LAB — COMPREHENSIVE METABOLIC PANEL
ALT: 27 U/L (ref 0–53)
AST: 37 U/L (ref 0–37)
Albumin: 2.9 g/dL — ABNORMAL LOW (ref 3.5–5.2)
Alkaline Phosphatase: 114 U/L (ref 39–117)
Anion gap: 8 (ref 5–15)
BUN: 13 mg/dL (ref 6–23)
CO2: 27 mmol/L (ref 19–32)
Calcium: 8.4 mg/dL (ref 8.4–10.5)
Chloride: 98 mEq/L (ref 96–112)
Creatinine, Ser: 0.78 mg/dL (ref 0.50–1.35)
GFR calc Af Amer: >90 mL/min (ref >90)
GFR calc non Af Amer: >90 mL/min (ref >90)
Glucose, Bld: 95 mg/dL (ref 70–99)
Potassium: 3.7 mmol/L (ref 3.5–5.1)
Sodium: 133 mmol/L — ABNORMAL LOW (ref 135–145)
Total Bilirubin: 1.5 mg/dL — ABNORMAL HIGH (ref 0.3–1.2)
Total Protein: 6 g/dL (ref 6.0–8.3)

## 2014-12-31 LAB — URINALYSIS, ROUTINE W REFLEX MICROSCOPIC
Bilirubin Urine: NEGATIVE
Glucose, UA: NEGATIVE mg/dL
Ketones, ur: NEGATIVE mg/dL
Leukocytes, UA: NEGATIVE
Nitrite: NEGATIVE
Protein, ur: NEGATIVE mg/dL
Specific Gravity, Urine: 1.017 (ref 1.005–1.030)
Urobilinogen, UA: 1 mg/dL (ref 0.0–1.0)
pH: 7.5 (ref 5.0–8.0)

## 2014-12-31 LAB — URINE MICROSCOPIC-ADD ON

## 2014-12-31 MED ORDER — FUROSEMIDE 10 MG/ML IJ SOLN
40.0000 mg | Freq: Once | INTRAMUSCULAR | Status: AC
  Administered 2014-12-31: 40 mg via INTRAVENOUS
  Filled 2014-12-31: qty 4

## 2014-12-31 NOTE — ED Notes (Signed)
PT states that he is out of his fluid pills x 3 days and c/o swelling and bloating to the abd; pt states that he was advised by his liver specialist to come to the ER to have fluid drawn off his abd; pt also states that he hasn't been urinating and bladder scan showed > 695

## 2014-12-31 NOTE — ED Provider Notes (Signed)
CSN: [id number]     Arrival date & time 12/31/14  0443 History   First MD Initiated Contact with Patient 12/31/14 [phone]     Chief Complaint  Patient presents with  . Urinary Retention  . Bloated     (Consider location/radiation/quality/duration/timing/severity/associated sxs/prior Treatment) HPI Comments: Jeffrey Snow is a 59 y.o. male with a PMHx of HTN, hep C, alcoholic cirrhosis, hepatocellular carcinoma, LLE claudication, and recent admission for lower GI bleed, who presents to the ED with complaints of abdominal bloating and pressure along with urinary retention 3 days. Patient reports that he had similar symptoms in the past, and he was started on Aldactone and Lasix which had helped, but he has been without these medications for 3 days due to a mixup at the pharmacy, which has caused him to start developing increasing abdominal bloating. He denies having any pain, but states that he just has pressure in his abdomen generalized throughout. He states that when he attempts to urinate, he is only able to urinate a small quantity, and he does not feel relieved. Denies any dribbling, fevers, chills, chest pain, shortness breath, nausea, vomiting, diarrhea, constipation, melena, hematochezia, dysuria, hematuria, numbness, tingling, weakness, testicular pain or swelling. He has not tried anything for his symptoms, and states that he has not had any coffee today. Denies recent sick contacts or travel.  The history is provided by the patient. No language interpreter was used.    Past Medical History  Diagnosis Date  . Depression   . Anxiety   . Panic attack   . Hypertension   . Hepatitis C   . Claudication of left lower extremity m-1  . Abnormal MRI of abdomen, liver  04/21/2014  . Cirrhosis, alcoholic 01/19/2682  . Liver mass   . [place] (hepatocellular carcinoma)    Past Surgical History  Procedure Laterality Date  . Cosmetic surgery      facial reconstructive surgery  . Aortogram    .  Abdominal aortagram N/A 12/27/2013    Procedure: ABDOMINAL Maxcine Ham;  Surgeon: Conrad [place], MD;  Location: [facility] CATH LAB;  Service: Cardiovascular;  Laterality: N/A;  . Lower extremity angiogram Bilateral 12/27/2013    Procedure: LOWER EXTREMITY ANGIOGRAM;  Surgeon: Conrad [place], MD;  Location: [facility] CATH LAB;  Service: Cardiovascular;  Laterality: Bilateral;  . Esophagogastroduodenoscopy N/A 12/17/2014    Procedure: ESOPHAGOGASTRODUODENOSCOPY (EGD);  Surgeon: Missy Sabins, MD;  Location: [facility] ENDOSCOPY;  Service: Endoscopy;  Laterality: N/A;   Family History  Problem Relation Age of Onset  . Diabetes Father   . Diabetes Brother   . Diabetes Brother    History  Substance Use Topics  . Smoking status: Former Smoker -- 0.50 packs/day    Quit date: 08/21/2011  . Smokeless tobacco: Never Used  . Alcohol Use: 3.6 oz/week    6 Cans of beer per week     Comment: Occasional beer on the weekends    Review of Systems  Constitutional: Negative for fever and chills.  Respiratory: Negative for shortness of breath.   Cardiovascular: Negative for chest pain and leg swelling.  Gastrointestinal: Positive for abdominal distention. Negative for nausea, vomiting, abdominal pain, diarrhea, constipation, blood in stool and anal bleeding.  Genitourinary: Positive for dysuria, decreased urine volume and difficulty urinating. Negative for urgency, frequency, hematuria, flank pain, discharge, penile swelling, scrotal swelling, penile pain and testicular pain.  Musculoskeletal: Negative for myalgias, back pain and arthralgias.  Skin: Negative for rash.  Neurological: Negative for syncope, weakness,  light-headedness and numbness.   10 Systems reviewed and are negative for acute change except as noted in the HPI.    Allergies  Folic acid  Home Medications   Prior to Admission medications   Medication Sig Start Date End Date Taking? Authorizing Provider  ALPRAZolam Duanne Moron) 0.5 MG tablet Take 0.5 mg by mouth 3  (three) times daily as needed for anxiety.    Yes Historical Provider, MD  benazepril (LOTENSIN) 40 MG tablet Take 40 mg by mouth every morning.   Yes Historical Provider, MD  bisacodyl (DULCOLAX) 5 MG EC tablet Take 5 mg by mouth daily as needed for mild constipation or moderate constipation.   Yes Historical Provider, MD  docusate sodium 100 MG CAPS Take 100 mg by mouth 2 (two) times daily. 06/11/14  Yes Ascencion Dike, PA-C  ergocalciferol (DRISDOL) 50000 UNITS capsule Take 1 capsule (50,000 Units total) by mouth once a week. 03/31/14  Yes Dorena Dew, FNP  FLUoxetine (PROZAC) 20 MG capsule Take 20 mg by mouth every morning.    Yes Historical Provider, MD  hydrochlorothiazide (HYDRODIURIL) 25 MG tablet Take 25 mg by mouth every morning.   Yes Historical Provider, MD  pantoprazole (PROTONIX) 40 MG tablet Take 1 tablet (40 mg total) by mouth 2 (two) times daily before a meal. 12/17/14  Yes Eugenie Filler, MD  potassium chloride SA (K-DUR,KLOR-CON) 20 MEQ tablet Take 2 tablets (40 mEq total) by mouth daily. For 5 days Patient taking differently: Take 20 mEq by mouth daily.  12/17/14  Yes Eugenie Filler, MD  spironolactone (ALDACTONE) 25 MG tablet Take 25 mg by mouth daily.   Yes Historical Provider, MD  thiamine 100 MG tablet Take 1 tablet (100 mg total) by mouth daily. 05/02/14  Yes Modena Jansky, MD  zolpidem (AMBIEN) 10 MG tablet Take 10 mg by mouth at bedtime.    Yes Historical Provider, MD  folic acid (FOLVITE) 1 MG tablet Take 1 tablet (1 mg total) by mouth daily. Patient not taking: Reported on 11/15/2014 07/06/14   Leana Gamer, MD  oxycodone (OXY-IR) 5 MG capsule Take 1 capsule (5 mg total) by mouth every 4 (four) hours as needed. Patient not taking: Reported on 11/15/2014 07/06/14   Leana Gamer, MD   BP 139/73 mmHg  Pulse 73  Temp(Src) 97.6 F (36.4 C) (Oral)  Resp 20  SpO2 96% Physical Exam  Constitutional: He is oriented to person, place, and time. Vital signs  are normal. He appears well-developed and well-nourished.  Non-toxic appearance. No distress.  Afebrile nontoxic, NAD  HENT:  Head: Normocephalic and atraumatic.  Mouth/Throat: Mucous membranes are normal.  Eyes: Conjunctivae and EOM are normal. Right eye exhibits no discharge. Left eye exhibits no discharge.  Neck: Normal range of motion. Neck supple. No JVD present.  Cardiovascular: Normal rate, regular rhythm, normal heart sounds and intact distal pulses.  Exam reveals no gallop and no friction rub.   No murmur heard. Pulmonary/Chest: Effort normal and breath sounds normal. No respiratory distress. He has no decreased breath sounds. He has no wheezes. He has no rhonchi. He has no rales.  Abdominal: Soft. Normal appearance and bowel sounds are normal. He exhibits distension, fluid wave and ascites. There is no tenderness. There is no rigidity, no rebound, no guarding, no CVA tenderness, no tenderness at McBurney's point and negative Murphy's sign.  Soft, nonTTP, +BS throughout, no r/g/r, neg murphy's, neg mcburney's, no CVA TTP, with mild distension but not tense, mild  fluid wave and ascites, dullness to percussion generalized  Musculoskeletal: Normal range of motion.  MAE x4  Neurological: He is alert and oriented to person, place, and time. He has normal strength. No sensory deficit.  Skin: Skin is warm, dry and intact. No rash noted.  Psychiatric: He has a normal mood and affect.  Nursing note and vitals reviewed.   ED Course  Procedures (including critical care time) Labs Review Labs Reviewed  CBC WITH DIFFERENTIAL - Abnormal; Notable for the following:    WBC 3.9 (*)    RBC 3.57 (*)    Hemoglobin 11.2 (*)    HCT 34.9 (*)    RDW 15.9 (*)    Platelets 64 (*)    All other components within normal limits  COMPREHENSIVE METABOLIC PANEL - Abnormal; Notable for the following:    Sodium 133 (*)    Albumin 2.9 (*)    Total Bilirubin 1.5 (*)    All other components within normal  limits  URINALYSIS, ROUTINE W REFLEX MICROSCOPIC - Abnormal; Notable for the following:    APPearance CLOUDY (*)    Hgb urine dipstick LARGE (*)    All other components within normal limits  URINE MICROSCOPIC-ADD ON - Abnormal; Notable for the following:    Squamous Epithelial / LPF MANY (*)    All other components within normal limits  LIPASE, BLOOD    Imaging Review No results found.   EKG Interpretation None      MDM   Final diagnoses:  Abdominal bloating  Abdominal distension  Ascites  Chronic anemia  Thrombocytopenia  Fluid retention    59 y.o. male with abdominal distension, not tense, with nonperitoneal abdominal exam, no tenderness. Initially bladder scan showed 626mL but when foley was placed it only drained approx 10mLs therefore it was removed. Bedside U/S by Dr. Lita Mains reveals moderate ascites. Pt with no hypoxia or pain. Will give dose of lasix here and get labs, and see if this improves his symptoms. Will hold off on any imaging.  8:59 AM U/A with Hgb likely from traumatic foley prior to collection, doubt UTI. CBC with baseline anemia and thrombocytopenia. CMP with mildly low sodium but otherwise at baseline. Lipase WNL. Pt has now urinated ~834ml and feels much better. Now advises me that his brother has picked up his lasix therefore he doesn't need any scripts. Will d/c home at this time, discussed taking his diuretics daily and calling his liver specialist on Monday for f/up. I explained the diagnosis and have given explicit precautions to return to the ER including for any other new or worsening symptoms. The patient understands and accepts the medical plan as it's been dictated and I have answered their questions. Discharge instructions concerning home care and prescriptions have been given. The patient is STABLE and is discharged to home in good condition.    BP 132/80 mmHg  Pulse 70  Temp(Src) 97.6 F (36.4 C) (Oral)  Resp 20  SpO2 95%  Meds ordered  this encounter  Medications  . furosemide (LASIX) injection 40 mg    Sig:      Patty Sermons [employer], PA-C 12/31/14 4540  Julianne Rice, MD 01/01/15 0330

## 2014-12-31 NOTE — Discharge Instructions (Signed)
Continue taking your home fluid pill as directed on the bottle. Call your liver specialist on Monday and make an appointment for follow up. Continue with a low-salt diet. Return to the ER for changes or worsening symptoms.   Ascites Ascites is a gathering of fluid in the belly (abdomen). This is most often caused by liver disease. It may also be caused by a number of other less common problems. It causes a ballooning out (distension) of the abdomen. CAUSES  Scarring of the liver (cirrhosis) is the most common cause of ascites. Other causes include:  Infection or inflammation in the abdomen.  Cancer in the abdomen.  Heart failure.  Certain forms of kidney failure (nephritic syndrome).  Inflammation of the pancreas.  Clots in the veins of the liver. SYMPTOMS  In the early stages of ascites, you may not have any symptoms. The main symptom of ascites is a sense of abdominal bloating. This is due to the presence of fluid. This may also cause an increase in abdominal or waist size. People with this condition can develop swelling in the legs, and men can develop a swollen scrotum. When there is a lot of fluid, it may be hard to breath. Stretching of the abdomen by fluid can be painful. DIAGNOSIS  Certain features of your medical history, such as a history of liver disease and of an enlarging abdomen, can suggest the presence of ascites. The diagnosis of ascites can be made on physical exam by your caregiver. An abdominal ultrasound examination can confirm that ascites is present, and estimate the amount of fluid. Once ascites is confirmed, it is important to determine its cause. Again, a history of one of the conditions listed in "CAUSES" provides a strong clue. A physical exam is important, and blood and X-ray tests may be needed. During a procedure called paracentesis, a sample of fluid is removed from the abdomen. This can determine certain key features about the fluid, such as whether or not  infection or cancer is present. Your caregiver will determine if a paracentesis is necessary. They will describe the procedure to you. PREVENTION  Ascites is a complication of other conditions. Therefore to prevent ascites, you must seek treatment for any significant health conditions you have. Once ascites is present, careful attention to fluid and salt intake may help prevent it from getting worse. If you have ascites, you should not drink alcohol. PROGNOSIS  The prognosis of ascites depends on the underlying disease. If the disease is reversible, such as with certain infections or with heart failure, then ascites may improve or disappear. When ascites is caused by cirrhosis, then it indicates that the liver disease has worsened, and further evaluation and treatment of the liver disease is needed. If your ascites is caused by cancer, then the success or failure of the cancer treatment will determine whether your ascites will improve or worsen. RISKS AND COMPLICATIONS  Ascites is likely to worsen if it is not properly diagnosed and treated. A large amount of ascites can cause pain and difficulty breathing. The main complication, besides worsening, is infection (called spontaneous bacterial peritonitis). This requires prompt treatment. TREATMENT  The treatment of ascites depends on its cause. When liver disease is your cause, medical management using water pills (diuretics) and decreasing salt intake is often effective. Ascites due to peritoneal inflammation or malignancy (cancer) alone does not respond to salt restriction and diuretics. Hospitalization is sometimes required. If the treatment of ascites cannot be managed with medications, a number of  other treatments are available. Your caregivers will help you decide which will work best for you. Some of these are:  Removal of fluid from the abdomen (paracentesis).  Fluid from the abdomen is passed into a vein (peritoneovenous shunting).  Liver  transplantation.  Transjugular intrahepatic portosystemic stent shunt. HOME CARE INSTRUCTIONS  It is important to monitor body weight and the intake and output of fluids. Weigh yourself at the same time every day. Record your weights. Fluid restriction may be necessary. It is also important to know your salt intake. The more salt you take in, the more fluid you will retain. Ninety percent of people with ascites respond to this approach.  Follow any directions for medicines carefully.  Follow up with your caregiver, as directed.  Report any changes in your health, especially any new or worsening symptoms.  If your ascites is from liver disease, avoid alcohol and other substances toxic to the liver. SEEK MEDICAL CARE IF:   Your weight increases more than a few pounds in a few days.  Your abdominal or waist size increases.  You develop swelling in your legs.  You had swelling and it worsens. SEEK IMMEDIATE MEDICAL CARE IF:   You develop a fever.  You develop new abdominal pain.  You develop difficulty breathing.  You develop confusion.  You have bleeding from the mouth, stomach, or rectum. MAKE SURE YOU:   Understand these instructions.  Will watch your condition.  Will get help right away if you are not doing well or get worse. Document Released: 12/02/2005 Document Revised: 02/24/2012 Document Reviewed: 07/03/2007 [facility] Patient Information 2015 [place], [place]. This information is not intended to replace advice given to you by your health care provider. Make sure you discuss any questions you have with your health care provider.  Bloating Bloating is the feeling of fullness in your belly. You may feel as though your pants are too tight. Often the cause of bloating is overeating, retaining fluids, or having gas in your bowel. It is also caused by swallowing air and eating foods that cause gas. Irritable bowel syndrome is one of the most common causes of bloating.  Constipation is also a common cause. Sometimes more serious problems can cause bloating. SYMPTOMS  Usually there is a feeling of fullness, as though your abdomen is bulged out. There may be mild discomfort.  DIAGNOSIS  Usually no particular testing is necessary for most bloating. If the condition persists and seems to become worse, your caregiver may do additional testing.  TREATMENT   There is no direct treatment for bloating.  Do not put gas into the bowel. Avoid chewing gum and sucking on candy. These tend to make you swallow air. Swallowing air can also be a nervous habit. Try to avoid this.  Avoiding high residue diets will help. Eat foods with soluble fibers (examples include root vegetables, apples, or barley) and substitute dairy products with soy and rice products. This helps irritable bowel syndrome.  If constipation is the cause, then a high residue diet with more fiber will help.  Avoid carbonated beverages.  Over-the-counter preparations are available that help reduce gas. Your pharmacist can help you with this. SEEK MEDICAL CARE IF:   Bloating continues and seems to be getting worse.  You notice a weight gain.  You have a weight loss but the bloating is getting worse.  You have changes in your bowel habits or develop nausea or vomiting. SEEK IMMEDIATE MEDICAL CARE IF:   You develop shortness of  breath or swelling in your legs.  You have an increase in abdominal pain or develop chest pain. Document Released: 10/02/2006 Document Revised: 02/24/2012 Document Reviewed: 11/20/2007 [facility] Patient Information 2015 [place], [place]. This information is not intended to replace advice given to you by your health care provider. Make sure you discuss any questions you have with your health care provider.

## 2015-01-05 ENCOUNTER — Ambulatory Visit: Payer: Self-pay | Admitting: Internal Medicine

## 2015-01-09 ENCOUNTER — Ambulatory Visit: Payer: Self-pay | Admitting: Internal Medicine

## 2015-01-12 ENCOUNTER — Encounter: Payer: Self-pay | Admitting: Internal Medicine

## 2015-01-12 ENCOUNTER — Ambulatory Visit (INDEPENDENT_AMBULATORY_CARE_PROVIDER_SITE_OTHER): Payer: Medicaid Other | Admitting: Internal Medicine

## 2015-01-12 VITALS — BP 157/55 | HR 63 | Temp 97.8°F | Resp 16 | Ht 68.0 in | Wt 140.0 lb

## 2015-01-12 DIAGNOSIS — C220 Liver cell carcinoma: Secondary | ICD-10-CM

## 2015-01-12 DIAGNOSIS — R1010 Upper abdominal pain, unspecified: Secondary | ICD-10-CM

## 2015-01-12 DIAGNOSIS — E876 Hypokalemia: Secondary | ICD-10-CM

## 2015-01-12 DIAGNOSIS — I10 Essential (primary) hypertension: Secondary | ICD-10-CM

## 2015-01-12 DIAGNOSIS — G8929 Other chronic pain: Secondary | ICD-10-CM

## 2015-01-12 DIAGNOSIS — R1011 Right upper quadrant pain: Secondary | ICD-10-CM

## 2015-01-12 LAB — BASIC METABOLIC PANEL
BUN: 9 mg/dL (ref 6–23)
CO2: 27 mEq/L (ref 19–32)
Calcium: 8.4 mg/dL (ref 8.4–10.5)
Chloride: 100 mEq/L (ref 96–112)
Creat: 0.76 mg/dL (ref 0.50–1.35)
Glucose, Bld: 71 mg/dL (ref 70–99)
Potassium: 3.5 mEq/L (ref 3.5–5.3)
Sodium: 136 mEq/L (ref 135–145)

## 2015-01-12 LAB — MAGNESIUM: Magnesium: 1.5 mg/dL (ref 1.5–2.5)

## 2015-01-12 MED ORDER — AMLODIPINE BESYLATE 5 MG PO TABS: 5.0000 mg | ORAL_TABLET | Freq: Every day | ORAL | Status: DC

## 2015-01-12 MED ORDER — BENAZEPRIL HCL 10 MG PO TABS: 10.0000 mg | ORAL_TABLET | Freq: Every day | ORAL | Status: DC

## 2015-01-12 MED ORDER — OXYCODONE HCL 5 MG PO CAPS: 5.0000 mg | ORAL_CAPSULE | ORAL | Status: DC | PRN

## 2015-01-12 NOTE — Progress Notes (Signed)
Patient ID: Jeffrey Snow, male   DOB: 1956-02-20, 59 y.o.   MRN: [id number]  HPI 59 y.o. male  presents for 3 month follow up with hypertension, and vitamin D defeciency. His blood pressure has not been controlled at home, today their BP is BP: (!) 157/55 mmHg He does not workout. He denies chest pain, shortness of breath, dizziness.  He is not on cholesterol medication and denies myalgias. His cholesterol is at goal. The cholesterol was:  04/22/2014: Cholesterol, Total 69; HDL Cholesterol by NMR 11*; LDL (calc) 44; Triglycerides 70   He ,  is on bASA,Patient is on Vitamin D supplement. 03/03/2014: Vit D, 25-Hydroxy 10*  Current Medications:  Current Outpatient Prescriptions on File Prior to Visit  Medication Sig Dispense Refill  . ALPRAZolam (XANAX) 0.5 MG tablet Take 0.5 mg by mouth 3 (three) times daily as needed for anxiety.     . docusate sodium 100 MG CAPS Take 100 mg by mouth 2 (two) times daily. 20 capsule 0  . ergocalciferol (DRISDOL) 50000 UNITS capsule Take 1 capsule (50,000 Units total) by mouth once a week. 4 capsule 12  . FLUoxetine (PROZAC) 20 MG capsule Take 20 mg by mouth every morning.     . potassium chloride SA (K-DUR,KLOR-CON) 20 MEQ tablet Take 2 tablets (40 mEq total) by mouth daily. For 5 days (Patient taking differently: Take 20 mEq by mouth daily. ) 10 tablet 0  . spironolactone (ALDACTONE) 25 MG tablet Take 25 mg by mouth daily.    Marland Kitchen thiamine 100 MG tablet Take 1 tablet (100 mg total) by mouth daily. 30 tablet 0  . zolpidem (AMBIEN) 10 MG tablet Take 10 mg by mouth at bedtime.     . bisacodyl (DULCOLAX) 5 MG EC tablet Take 5 mg by mouth daily as needed for mild constipation or moderate constipation.    . folic acid (FOLVITE) 1 MG tablet Take 1 tablet (1 mg total) by mouth daily. (Patient not taking: Reported on 11/15/2014) 30 tablet 11  . hydrochlorothiazide (HYDRODIURIL) 25 MG tablet Take 25 mg by mouth every morning.     No current facility-administered  medications on file prior to visit.   Medical History:  Past Medical History  Diagnosis Date  . Depression   . Anxiety   . Panic attack   . Hypertension   . Hepatitis C   . Claudication of left lower extremity m-1  . Abnormal MRI of abdomen, liver  04/21/2014  . Cirrhosis, alcoholic 08/21/7892  . Liver mass   . HCC (hepatocellular carcinoma)    Allergies:  Allergies  Allergen Reactions  . Folic Acid Other (See Comments)    Makes stomach feel funny     Review of Systems:  Review of Systems  Constitutional: Negative.   HENT: Negative.   Eyes: Negative.   Respiratory: Negative.   Cardiovascular: Negative.   Gastrointestinal: Negative.   Genitourinary: Negative.   Musculoskeletal: Negative.        Foot pain  Skin: Negative.   Neurological: Negative.   Endo/Heme/Allergies: Negative.   Psychiatric/Behavioral: Negative.     Family history- Review and unchanged Social history- Review and unchanged Physical Exam: BP 157/55 mmHg  Pulse 63  Temp(Src) 97.8 F (36.6 C) (Oral)  Resp 16  Ht 5\' 8"  (1.727 m)  Wt 140 lb (63.504 kg)  BMI 21.29 kg/m2 Wt Readings from Last 3 Encounters:  01/12/15 140 lb (63.504 kg)  12/17/14 129 lb 9.6 oz (58.786 kg)  10/04/14 137 lb (62.143  kg)   General Appearance: He appears u-nderweight, in no apparent distress. Eyes: PERRLA, EOMs, conjunctiva no swelling or erythema ENT/Mouth: Ext aud canals clear, TMs without erythema, bulging. No erythema, swelling, or exudate on post pharynx.  Tonsils not swollen or erythematous. Hearing normal.  Neck: Supple, thyroid normal. No JVD or Carotid Bruit Respiratory: Respiratory effort normal, BS equal bilaterally without rales, rhonchi, wheezing or stridor.  Cardio: RRR with no MRGs. Brisk peripheral pulses without edema.  Abdomen: Soft, + BS.  Non tender, no guarding, rebound, hernias, masses. Lymphatics: Non tender without lymphadenopathy.  Musculoskeletal: Full ROM, normal strength, normal gait.  Skin:  Warm, dry without rashes, lesions, ecchymosis.  Neuro: Cranial nerves intact. No cerebellar symptoms. Sensation intact.  Psych: Awake and oriented X 3, normal affect, Insight and Judgment appropriate. Good recent and remote recall   Assessment and Plan:  1. Essential hypertension - Pt has not been taking Lotensin as previously prescribed. Instead he has been on 3 diuretics including Spironolactone, Lasix and HCTZ. He needs to remain on the Spironolactone and Lasix because of the Cirrhosis. I will discontinue HCTZ and start on Lotensin 10 mg daily. I am aware of the interaction with spironolactone that could lead to elevated potassium. However patient has had persistent hypokalemia and this should help to compensate for this. - benazepril (LOTENSIN) 10 MG tablet; Take 1 tablet (10 mg total) by mouth daily.  Dispense: 90 tablet; Refill: 3  2. Hypokalemia - Will check levels today and check again 2 weeks after starting Lotensin. - Basic Metabolic Panel - Magnesium  3. RLE pain - Pt has continued to have intermittent pain since having vascular studies in 2014. He has had only minimal use of  Opiates. - oxycodone (OXY-IR) 5 MG capsule; Take 1 capsule (5 mg total) by mouth every 4 (four) hours as needed.  Dispense: 30 capsule; Refill: 0  4. [place] (hepatocellular carcinoma) - Followed by Dr. Laurence Ferrari at [facility]  Hypertension: Continue medication, monitor blood pressure at home. Continue DASH diet.  Reminder to go to the ER if any CP, SOB, nausea, dizziness, severe HA, changes vision/speech, left arm numbness and tingling and jaw pain.  Cholesterol: Continue diet and exercise. Check cholesterol.    Vitamin D Deficiency:- check level and continue medications.   Continue diet and meds as discussed. Further disposition pending results of labs. Discussed medication effects and side effects.    [name],[name], MD 11:27 AM [facility]

## 2015-01-13 ENCOUNTER — Telehealth: Payer: Self-pay | Admitting: Internal Medicine

## 2015-01-13 ENCOUNTER — Telehealth: Payer: Self-pay

## 2015-01-13 ENCOUNTER — Encounter: Payer: Self-pay | Admitting: Internal Medicine

## 2015-01-13 NOTE — Telephone Encounter (Signed)
Called patient at 12:10pm left message advising patient he should NOT be on the amlodipine or HCTZ. He is to only take the Benazepril 10mg , the Lasix and the Spironolactone per dr. Zigmund Daniel. Advised pt on message if he had any questions to call us back and left our number. Thanks!

## 2015-01-13 NOTE — Telephone Encounter (Signed)
Patient called stating Benazapril/ Amlodipine is causing him to retain fluid, stomach swelling, and difficulty urinating. Would like to go back to previous medications.

## 2015-01-13 NOTE — Telephone Encounter (Signed)
Patient called back upset that we would not prescribe his previous medications. Per Dr. Zigmund Daniel he should only be taking benazapril, lasix, and spironolactone. He was upset said he was not going to take the benazepril because "it backs me up and I can't pee while taking it and not the hydrochlorothiazide". I advised him if he was swelling again he should go to the ER. He refused to go ER saying he wasn't going back because he had been there the last two weekends. Dr. Zigmund Daniel has been notified of this issue.

## 2015-01-17 ENCOUNTER — Telehealth: Payer: Self-pay | Admitting: Internal Medicine

## 2015-01-17 NOTE — Telephone Encounter (Signed)
Called patient today 01/16/2015 @9 :39am. Left message we need to review patients medication. Thanks!

## 2015-01-17 NOTE — Telephone Encounter (Signed)
Called patietn to discuss medications as there seems to be some confusion about his medications. He si to take Spironolactone, Furosemide and Lotensin. He is not on Amlodipine and he is to discontinure HCTZ.

## 2015-01-17 NOTE — Telephone Encounter (Signed)
Patient returned call and states he is taking the following medications "even though they are making me swell". He says he is certain that the liver specialists will change them tomorrow at his appointment.   Iron 65mg - once daily  Vitamin B1- once daily  Spironaloactone 25mg - 2 daily  Furosemide 20mg - 1 daily  Benazepril 10mg - 1 daily  Xanax- 0.5mg  - 3 daily as needed  prozac 20mg - 1 daily  ambien 10mg - 1 at bedtime daily

## 2015-01-19 ENCOUNTER — Ambulatory Visit
Admission: RE | Admit: 2015-01-19 | Discharge: 2015-01-19 | Disposition: A | Discharge status: Home / Self Care | Payer: Medicaid Other | Source: Ambulatory Visit | Attending: Interventional Radiology | Admitting: Interventional Radiology

## 2015-01-19 ENCOUNTER — Ambulatory Visit (HOSPITAL_COMMUNITY)
Admission: RE | Admit: 2015-01-19 | Discharge: 2015-01-19 | Disposition: A | Discharge status: Home / Self Care | Payer: Medicaid Other | Source: Ambulatory Visit | Attending: Interventional Radiology | Admitting: Interventional Radiology

## 2015-01-19 DIAGNOSIS — C220 Liver cell carcinoma: Secondary | ICD-10-CM | POA: Insufficient documentation

## 2015-01-19 HISTORY — PX: IR GENERIC HISTORICAL: IMG1180011

## 2015-01-19 MED ORDER — GADOXETATE DISODIUM 0.25 MMOL/ML IV SOLN
6.0000 mL | Freq: Once | INTRAVENOUS | Status: AC | PRN
  Administered 2015-01-19: 6 mL via INTRAVENOUS

## 2015-01-19 NOTE — Progress Notes (Signed)
Chief Complaint: Chief Complaint  Patient presents with  . Follow-up    Follow up [place] of Right Hepatic Lobe [place]    Referring Physician(s): McCullough,Heath  History of Present Illness: TEAGEN MCLEARY is a 59 y.o. male with alcoholic and HCV cirrhosis complicated by hepatocellular carcinoma, ascites and esophageal varices. He was treated by combination drug-eluting bead transarterial chemoembolization followed by microwave thermal ablation on 6/25 and 06/10/2014. Follow-up surveillance imaging hasn't demonstrated a complete response to therapy to this point.   He presents today for his scheduled 6 month follow-up evaluation. Today, Mr. Klemens denies abdominal pain or problems related to his liver or recent ablation procedure. However, he does have a litany of nonspecific complaints including recent nausea and vomiting, urinary retention and slight increase in abdominal ascites. He relates all of these changes to a recent change in his medications from hydrochlorothiazide to Lotensin 10 mg daily.  I attempted to explain to him that this change in medications would be unlikely to result in this constellation of side effects and encouraged him to contact his primary care physician.  He continues to abstain from alcohol. He kept his most recent appointment with Roosevelt Locks, hepatology NP who is managing his ascites and may coordinate any future plans for transplantation.  Past Medical History  Diagnosis Date  . Depression   . Anxiety   . Panic attack   . Hypertension   . Hepatitis C   . Claudication of left lower extremity m-1  . Abnormal MRI of abdomen, liver  04/21/2014  . Cirrhosis, alcoholic 0/08/9832  . Liver mass   . Weed (hepatocellular carcinoma)     Past Surgical History  Procedure Laterality Date  . Cosmetic surgery      facial reconstructive surgery  . Aortogram    . Abdominal aortagram N/A 12/27/2013    Procedure: ABDOMINAL Maxcine Ham;  Surgeon: Conrad Greene, MD;   Location: [facility] CATH LAB;  Service: Cardiovascular;  Laterality: N/A;  . Lower extremity angiogram Bilateral 12/27/2013    Procedure: LOWER EXTREMITY ANGIOGRAM;  Surgeon: Conrad Koliganek, MD;  Location: [facility] CATH LAB;  Service: Cardiovascular;  Laterality: Bilateral;  . Esophagogastroduodenoscopy N/A 12/17/2014    Procedure: ESOPHAGOGASTRODUODENOSCOPY (EGD);  Surgeon: Missy Sabins, MD;  Location: [facility];  Service: Endoscopy;  Laterality: N/A;    Allergies: Folic acid  Medications: Prior to Admission medications   Medication Sig Start Date End Date Taking? Authorizing Provider  ALPRAZolam Duanne Moron) 0.5 MG tablet Take 0.5 mg by mouth 3 (three) times daily as needed for anxiety.    Yes Historical Provider, MD  benazepril (LOTENSIN) 10 MG tablet Take 1 tablet (10 mg total) by mouth daily. 01/12/15  Yes Leana Gamer, MD  bisacodyl (DULCOLAX) 5 MG EC tablet Take 5 mg by mouth daily as needed for mild constipation or moderate constipation.   Yes Historical Provider, MD  docusate sodium 100 MG CAPS Take 100 mg by mouth 2 (two) times daily. 06/11/14  Yes Ascencion Dike, PA-C  FLUoxetine (PROZAC) 20 MG capsule Take 20 mg by mouth every morning.    Yes Historical Provider, MD  spironolactone (ALDACTONE) 25 MG tablet Take 25 mg by mouth daily.   Yes Historical Provider, MD  thiamine 100 MG tablet Take 1 tablet (100 mg total) by mouth daily. 05/02/14  Yes Modena Jansky, MD  zolpidem (AMBIEN) 10 MG tablet Take 10 mg by mouth at bedtime.    Yes Historical Provider, MD  ergocalciferol (DRISDOL) 50000 UNITS capsule  Take 1 capsule (50,000 Units total) by mouth once a week. Patient not taking: Reported on 01/19/2015 03/31/14   Dorena Dew, FNP  folic acid (FOLVITE) 1 MG tablet Take 1 tablet (1 mg total) by mouth daily. Patient not taking: Reported on 11/15/2014 07/06/14   Leana Gamer, MD  hydrochlorothiazide (HYDRODIURIL) 25 MG tablet Take 25 mg by mouth every morning.    Historical Provider, MD    oxycodone (OXY-IR) 5 MG capsule Take 1 capsule (5 mg total) by mouth every 4 (four) hours as needed. Patient not taking: Reported on 01/19/2015 01/12/15   Leana Gamer, MD  potassium chloride SA (K-DUR,KLOR-CON) 20 MEQ tablet Take 2 tablets (40 mEq total) by mouth daily. For 5 days Patient not taking: Reported on 01/19/2015 12/17/14   Eugenie Filler, MD    Family History  Problem Relation Age of Onset  . Diabetes Father   . Diabetes Brother   . Diabetes Brother     History   Social History  . Marital Status: Single    Spouse Name: N/A    Number of Children: 3  . Years of Education: N/A   Occupational History  .  [employer]   Social History Main Topics  . Smoking status: Former Smoker -- 0.50 packs/day    Quit date: 08/21/2011  . Smokeless tobacco: Never Used  . Alcohol Use: No     Comment: Occasional beer on the weekends  . Drug Use: No     Comment: abused cocaine in the 80's  . Sexual Activity: Not on file   Other Topics Concern  . Not on file   Social History Narrative   Single, never married.  Lives alone.  3 daughters, 16 grandchildren who are not very involved in the patient's care.  Quit smoking and heavy drinking 3.5 years ago.     ECOG Status: 1 - Symptomatic but completely ambulatory  Review of Systems: A 12 point ROS discussed and pertinent positives are indicated in the HPI above.  All other systems are negative.  Review of Systems  Vital Signs: BP 131/57 mmHg  Pulse 63  Temp(Src) 97.8 F (36.6 C) (Oral)  Resp 14  Ht 5\' 8"  (1.727 m)  Wt 137 lb (62.143 kg)  BMI 20.84 kg/m2  SpO2 100%  Physical Exam  Constitutional: He is oriented to person, place, and time. He appears well-developed and well-nourished.  Eyes: No scleral icterus.  Cardiovascular: Normal rate and regular rhythm.   Pulmonary/Chest: Effort normal and breath sounds normal.  Abdominal: Soft. He exhibits no distension. There is no tenderness.  Neurological: He is alert and  oriented to person, place, and time.  Skin: Skin is warm and dry.  Psychiatric: He has a normal mood and affect. His behavior is normal.  Nursing note and vitals reviewed.   Imaging: Mr Abdomen W Wo Contrast  01/19/2015   CLINICAL DATA:  Status post and DEB-TACE and microwave ablation of the right hepatic lobe HCC in June 2015.  EXAM: MRI ABDOMEN WITHOUT AND WITH CONTRAST  TECHNIQUE: Multiplanar multisequence MR imaging of the abdomen was performed both before and after the administration of intravenous contrast.  CONTRAST:  6 cc Eovist  COMPARISON:  10/03/2014  FINDINGS: Lower chest: No significant lung base abnormality. No obvious pulmonary lesions or pleural effusion.  Hepatobiliary: Stable 3.8 x 3.4 cm right hepatic lobe liver lesion. There is intrinsic increased T1 signal abnormality which is likely hemorrhage and proteinaceous debris. Low T2 signal intensity. No  restricted diffusion. No contrast enhancement is demonstrated. No MR findings to suggest residual or recurrent tumor. No other areas in the liver of abnormal early arterial phase enhancement to suggest dysplastic nodules or HCC.  Stable advanced cirrhotic changes. No intrahepatic biliary dilatation. The gallbladder demonstrates small gallstones but no common bile duct dilatation.  Pancreas: Normal  Spleen: Stable splenomegaly.  No splenic lesions.  Adrenals/Urinary Tract: The adrenal glands and kidneys are unremarkable and stable.  Stomach/Bowel: The stomach, duodenum, small bowel and colon are grossly normal. No inflammatory changes, mass lesions or obstructive findings.  Vascular/Lymphatic: Stable upper abdominal lymphadenopathy due to cirrhosis. There are portal venous collaterals and esophageal varices. The portal and splenic veins are patent. Stable atherosclerotic changes involving the aorta. The branch vessels are patent.  Other: Moderate to large volume ascites.  Musculoskeletal: No significant bony findings.  IMPRESSION: 1. Stable MR  appearance of the treated right hepatic lobe liver lesion. No findings for residual or recurrent tumor. 2. Advanced cirrhotic changes with portal venous hypertension, portal venous collaterals, esophageal varices, splenomegaly and moderate to large volume ascites. 3. No new enhancing liver lesions to suggest dysplastic nodules or new HCC.   Electronically Signed   By: Kalman Jewels M.D.   On: 01/19/2015 10:05    Labs:  CBC:  Recent Labs  12/16/14 1744 12/17/14 0535 12/17/14 1237 12/31/14 0700  WBC 5.3 4.0 4.3 3.9*  HGB 7.0* 9.7* 9.4* 11.2*  HCT 21.4* 28.9* 27.6* 34.9*  PLT 60* 55* 53* 64*    COAGS:  Recent Labs  04/28/14 1356 04/28/14 2214  06/09/14 0918 09/26/14 0705 12/16/14 1244 12/17/14 0535  INR 1.85* 1.96*  < > 1.50* 1.50* 1.50* 1.40  APTT 37 40*  --  32  --  31  --   < > = values in this interval not displayed.  BMP:  Recent Labs  11/15/14 1138 12/16/14 1140 12/17/14 0535 12/31/14 0700 01/12/15 1127  NA 139 135 136 133* 136  K 2.6* 2.8* 3.2* 3.7 3.5  CL 100 101 104 98 100  CO2 26 25 26 27 27   GLUCOSE 102* 140* 85 95 71  BUN 8 47* 33* 13 9  CALCIUM 9.3 8.9 8.4 8.4 8.4  CREATININE 0.86 0.96 0.86 0.78 0.76  GFRNONAA >90 89* >90 >90  --   GFRAA >90 >90 >90 >90  --     LIVER FUNCTION TESTS:  Recent Labs  09/26/14 0705 12/16/14 1140 12/17/14 0535 12/31/14 0700  BILITOT 1.8* 1.3* 2.4* 1.5*  AST 56* 53* 43* 37  ALT 41 32 32 27  ALKPHOS 127* 69 66 114  PROT 6.1 5.3* 5.1* 6.0  ALBUMIN 3.0* 2.9* 3.0* 2.9*    TUMOR MARKERS:  Recent Labs  04/21/14 1700  AFPTM 75.0*    Assessment and Plan:  Hepatocellular cancer 1.) Complete response following DEB-TACE and microwave thermal ablation. No evidence of residual or recurrent disease over 7 months postprocedure. 2.) Repeat liver MRI with Eovist in 4 months.  If this scan demonstrates no evidence of active disease, we can likely extend further imaging to 6 months. 3.) Repeat liver labs in 4  months as well.  Ascites 1.) Currently relatively well controlled. He has a small volume of ascites on his MRI today and has noticed a slight increase in abdominal girth over the last few days. 2.) Greatly appreciate Mrs. Cherlynn June assistance in managing his ascites.  Urinary retention, nausea, and other new symptoms 1.) I encouraged Mr. Carreker that his recent change in  medications would be unlikely to bring on the symptoms so quickly (he reports they began within 24 hours of the change). 2.) I suggested he may just need to give it some time for his body to adjust to the new medication regimen. I encouraged him to contact his primary care physician to discuss further.  SignedJacqulynn Cadet 01/19/2015, 2:08 PM   I spent a total of 15 minutes face to face in clinical consultation, greater than 50% of which was counseling/coordinating care for hepatocellular cancer.

## 2015-01-25 ENCOUNTER — Other Ambulatory Visit: Payer: Self-pay | Admitting: Internal Medicine

## 2015-01-25 DIAGNOSIS — I10 Essential (primary) hypertension: Secondary | ICD-10-CM

## 2015-01-26 ENCOUNTER — Other Ambulatory Visit (INDEPENDENT_AMBULATORY_CARE_PROVIDER_SITE_OTHER): Payer: Medicaid Other

## 2015-01-26 DIAGNOSIS — R739 Hyperglycemia, unspecified: Secondary | ICD-10-CM

## 2015-01-26 DIAGNOSIS — Z0000 Encounter for general adult medical examination without abnormal findings: Secondary | ICD-10-CM

## 2015-01-26 DIAGNOSIS — I10 Essential (primary) hypertension: Secondary | ICD-10-CM

## 2015-01-26 LAB — LIPID PANEL
Cholesterol: 113 mg/dL (ref 0–200)
HDL: 35 mg/dL — ABNORMAL LOW (ref >39)
LDL Cholesterol: 69 mg/dL (ref 0–99)
Total CHOL/HDL Ratio: 3.2 Ratio
Triglycerides: 46 mg/dL (ref <150)
VLDL: 9 mg/dL (ref 0–40)

## 2015-01-26 LAB — MAGNESIUM: Magnesium: 1.4 mg/dL — ABNORMAL LOW (ref 1.5–2.5)

## 2015-01-27 LAB — HEMOGLOBIN A1C
Hgb A1c MFr Bld: 5 % (ref <5.7)
Mean Plasma Glucose: 97 mg/dL (ref <117)

## 2015-02-08 ENCOUNTER — Other Ambulatory Visit (HOSPITAL_COMMUNITY): Payer: Self-pay | Admitting: Nurse Practitioner

## 2015-02-08 DIAGNOSIS — R188 Other ascites: Secondary | ICD-10-CM

## 2015-02-14 ENCOUNTER — Ambulatory Visit (HOSPITAL_COMMUNITY): Payer: Medicaid Other

## 2015-02-14 ENCOUNTER — Encounter (HOSPITAL_COMMUNITY): Payer: Self-pay

## 2015-02-23 ENCOUNTER — Ambulatory Visit (INDEPENDENT_AMBULATORY_CARE_PROVIDER_SITE_OTHER): Payer: Medicaid Other | Admitting: Internal Medicine

## 2015-02-23 VITALS — BP 119/54 | HR 60 | Temp 98.1°F | Resp 16 | Ht 68.0 in | Wt 146.0 lb

## 2015-02-23 DIAGNOSIS — M79676 Pain in unspecified toe(s): Secondary | ICD-10-CM

## 2015-02-23 DIAGNOSIS — I10 Essential (primary) hypertension: Secondary | ICD-10-CM | POA: Diagnosis not present

## 2015-02-23 DIAGNOSIS — Z0000 Encounter for general adult medical examination without abnormal findings: Secondary | ICD-10-CM | POA: Diagnosis not present

## 2015-02-23 DIAGNOSIS — C220 Liver cell carcinoma: Secondary | ICD-10-CM

## 2015-02-23 DIAGNOSIS — Z1211 Encounter for screening for malignant neoplasm of colon: Secondary | ICD-10-CM

## 2015-02-23 DIAGNOSIS — E876 Hypokalemia: Secondary | ICD-10-CM

## 2015-02-23 DIAGNOSIS — R1010 Upper abdominal pain, unspecified: Secondary | ICD-10-CM

## 2015-02-23 DIAGNOSIS — R1011 Right upper quadrant pain: Secondary | ICD-10-CM

## 2015-02-23 DIAGNOSIS — M79604 Pain in right leg: Secondary | ICD-10-CM | POA: Diagnosis not present

## 2015-02-23 DIAGNOSIS — G8929 Other chronic pain: Secondary | ICD-10-CM

## 2015-02-23 DIAGNOSIS — M79673 Pain in unspecified foot: Secondary | ICD-10-CM

## 2015-02-23 LAB — BASIC METABOLIC PANEL
BUN: 18 mg/dL (ref 6–23)
CO2: 25 mEq/L (ref 19–32)
Calcium: 8.7 mg/dL (ref 8.4–10.5)
Chloride: 104 mEq/L (ref 96–112)
Creat: 0.84 mg/dL (ref 0.50–1.35)
Glucose, Bld: 82 mg/dL (ref 70–99)
Potassium: 4.3 mEq/L (ref 3.5–5.3)
Sodium: 138 mEq/L (ref 135–145)

## 2015-02-23 LAB — MAGNESIUM: Magnesium: 1.7 mg/dL (ref 1.5–2.5)

## 2015-02-23 MED ORDER — OXYCODONE HCL 5 MG PO CAPS: 5.0000 mg | ORAL_CAPSULE | ORAL | Status: DC | PRN

## 2015-02-23 NOTE — Patient Instructions (Signed)

## 2015-02-23 NOTE — Progress Notes (Signed)
Patient ID: Jeffrey Snow, male   DOB: April 05, 1956, 59 y.o.   MRN: [id number]  ANNUAL PREVENTATIVE VISIT AND CPE  Subjective:  Jeffrey TEBBETTS is a 59 y.o. male who presents for Annual  complete physical.    He has had elevated blood pressure for many years. His blood pressure has been controlled at home, today their BP is BP: (!) 119/54 mmHg He does not workout. He denies chest pain, shortness of breath, dizziness.  He is not on cholesterol medication. His cholesterol is at goal. The cholesterol last visit was:   Lab Results  Component Value Date   CHOL 113 01/26/2015   HDL 35* 01/26/2015   LDLCALC 69 01/26/2015   TRIG 46 01/26/2015   CHOLHDL 3.2 01/26/2015   He does not have diabetes. His last A1C in the office was:  Lab Results  Component Value Date   HGBA1C 5.0 01/26/2015   Patient is on Vitamin D supplement.   Lab Results  Component Value Date   VD25OH 10* 03/03/2014       Names of Other Physician/Practitioners you currently use: 1. [facility] here for primary care 2. [facility]- [name]: Gastroenterologist 3. Radiation Oncologist: Dr. Geroge Baseman 4. Dr. Jana Hakim: [place] Patient Care Team: Leana Gamer, MD as PCP - General (Internal Medicine)   Medication Review: Current Outpatient Prescriptions on File Prior to Visit  Medication Sig Dispense Refill  . ALPRAZolam (XANAX) 0.5 MG tablet Take 0.5 mg by mouth 3 (three) times daily as needed for anxiety.     . benazepril (LOTENSIN) 10 MG tablet Take 1 tablet (10 mg total) by mouth daily. 90 tablet 3  . bisacodyl (DULCOLAX) 5 MG EC tablet Take 5 mg by mouth daily as needed for mild constipation or moderate constipation.    . docusate sodium 100 MG CAPS Take 100 mg by mouth 2 (two) times daily. 20 capsule 0  . ergocalciferol (DRISDOL) 50000 UNITS capsule Take 1 capsule (50,000 Units total) by mouth once a week. 4 capsule 12  . FLUoxetine (PROZAC) 20 MG capsule Take 20 mg by mouth every morning.      . folic acid (FOLVITE) 1 MG tablet Take 1 tablet (1 mg total) by mouth daily. 30 tablet 11  . spironolactone (ALDACTONE) 25 MG tablet Take 25 mg by mouth 3 (three) times daily.     Marland Kitchen thiamine 100 MG tablet Take 1 tablet (100 mg total) by mouth daily. 30 tablet 0  . zolpidem (AMBIEN) 10 MG tablet Take 10 mg by mouth at bedtime.     . potassium chloride SA (K-DUR,KLOR-CON) 20 MEQ tablet Take 2 tablets (40 mEq total) by mouth daily. For 5 days (Patient not taking: Reported on 01/19/2015) 10 tablet 0   No current facility-administered medications on file prior to visit.    Current Problems (verified) Patient Active Problem List   Diagnosis Date Noted  . Anemia, blood loss 12/16/2014  . Melena 12/16/2014  . Anemia 12/16/2014  . Abdominal pain, chronic, right upper quadrant 07/06/2014  . [place] (hepatocellular carcinoma) 06/09/2014  . Other pancytopenia 04/27/2014  . Cellulitis of leg 04/24/2014  . Elevated troponin 04/23/2014  . Normocytic anemia 04/21/2014  . Cirrhosis, alcoholic 69/79/4801  . Hyponatremia 04/21/2014  . Hypokalemia 04/21/2014  . Metabolic acidosis 65/53/7482  . Hepatitis C 04/21/2014  . Abnormal MRI of abdomen, liver  04/21/2014  . Leg pain 04/21/2014  . Rash and nonspecific skin eruption 04/21/2014  . T wave inversion in EKG  04/21/2014  . Right foot pain 03/31/2014  . Hyperglycemia 03/03/2014  . Vitamin D deficiency disease 03/03/2014  . HTN (hypertension) 03/03/2014  . Elevated LFTs 03/03/2014  . Screening for colon cancer 03/03/2014  . Annual physical exam 03/03/2014  . PVD (peripheral vascular disease) 12/03/2013  . PAD (peripheral artery disease) 10/26/2013  . Arterial insufficiency 10/26/2013  . Fall 10/26/2013  . Depression 09/22/2013  . Insomnia 09/22/2013  . Claudication of left lower extremity   . Edema 08/20/2013  . Pain in limb 08/20/2013  . Atherosclerosis of native arteries of the extremities with intermittent claudication 08/20/2013  .  Thrombocytopenia 10/12/2012    Screening Tests Health Maintenance  Topic Date Due  . COLONOSCOPY  02/20/2005  . ZOSTAVAX  02/21/2015  . INFLUENZA VACCINE  07/17/2015  . TETANUS/TDAP  01/28/2024  . HIV Screening  Completed    Immunization History  Administered Date(s) Administered  . Influenza,inj,Quad PF,36+ Mos 12/20/2013, 12/21/2014  . Tdap 01/27/2014    Preventative care: Last colonoscopy: 02/20/2005 Last prostate and Testicular examination: 02/23/2015 DEXA:N/A  Vaccinations:  Pneumococcal: Due at age 42 Prevnar13: Declined today will get at next visit Shingles/Zostavax: Pt advised to obtain from [employer]. This medication is not covered by your insurance if dispensed from your Physician's office. You can obtain your vaccine at an area Pharmacy.    Medication List       This list is accurate as of: 02/23/15  3:07 PM.  Always use your most recent med list.               ALPRAZolam 0.5 MG tablet  Commonly known as:  XANAX  Take 0.5 mg by mouth 3 (three) times daily as needed for anxiety.     benazepril 10 MG tablet  Commonly known as:  LOTENSIN  Take 1 tablet (10 mg total) by mouth daily.     bisacodyl 5 MG EC tablet  Commonly known as:  DULCOLAX  Take 5 mg by mouth daily as needed for mild constipation or moderate constipation.     DSS 100 MG Caps  Take 100 mg by mouth 2 (two) times daily.     ergocalciferol 50000 UNITS capsule  Commonly known as:  DRISDOL  Take 1 capsule (50,000 Units total) by mouth once a week.     FLUoxetine 20 MG capsule  Commonly known as:  PROZAC  Take 20 mg by mouth every morning.     folic acid 1 MG tablet  Commonly known as:  FOLVITE  Take 1 tablet (1 mg total) by mouth daily.     oxycodone 5 MG capsule  Commonly known as:  OXY-IR  Take 1 capsule (5 mg total) by mouth every 4 (four) hours as needed.     potassium chloride SA 20 MEQ tablet  Commonly known as:  K-DUR,KLOR-CON  Take 2 tablets (40 mEq total) by  mouth daily. For 5 days     spironolactone 25 MG tablet  Commonly known as:  ALDACTONE  Take 25 mg by mouth 3 (three) times daily.     thiamine 100 MG tablet  Take 1 tablet (100 mg total) by mouth daily.     zolpidem 10 MG tablet  Commonly known as:  AMBIEN  Take 10 mg by mouth at bedtime.        Past Surgical History  Procedure Laterality Date  . Cosmetic surgery      facial reconstructive surgery  . Aortogram    . Abdominal aortagram N/A 12/27/2013  Procedure: ABDOMINAL AORTAGRAM;  Surgeon: Conrad McColl, MD;  Location[facility] CATH LAB;  Service: Cardiovascular;  Laterality: N/A;  . Lower extremity angiogram Bilateral 12/27/2013    Procedure: LOWER EXTREMITY ANGIOGRAM;  Surgeon: Conrad Frederick, MD;  Location: [facility] CATH LAB;  Service: Cardiovascular;  Laterality: Bilateral;  . Esophagogastroduodenoscopy N/A 12/17/2014    Procedure: ESOPHAGOGASTRODUODENOSCOPY (EGD);  Surgeon: Missy Sabins, MD;  Location: [facility];  Service: Endoscopy;  Laterality: N/A;   Family History  Problem Relation Age of Onset  . Diabetes Father   . Diabetes Brother   . Diabetes Brother     History reviewed: allergies, current medications, past family history, past medical history, past social history, past surgical history and problem list   Risk Factors: Osteoporosis/FallRisk: dietary calcium and/or vitamin D deficiency In the past year have you fallen or had a near fall?:No History of fracture in the past year: no  Tobacco History  Substance Use Topics  . Smoking status: Former Smoker -- 0.50 packs/day    Quit date: 08/21/2011  . Smokeless tobacco: Never Used  . Alcohol Use: No     Comment: Occasional beer on the weekends   He does smoke. Are there smokers in your home (other than you)?  No  Alcohol Current alcohol use: history of alcohol abuse . Has not had ETOH since cancer diagnosis last year.  Caffeine Current caffeine use: denies use  Exercise Current exercise:  none  Nutrition/Diet Current diet: in general, an "unhealthy" diet  Cardiac risk factors: advanced age (older than 78 for men, 49 for women), hypertension, male gender and smoking/ tobacco exposure.  Depression Screen (Note: if answer to either of the following is "Yes", a more complete depression screening is indicated)   Q1: Over the past two weeks, have you felt down, depressed or hopeless? No  Q2: Over the past two weeks, have you felt little interest or pleasure in doing things? No  Have you lost interest or pleasure in daily life? No  Do you often feel hopeless? No  Do you cry easily over simple problems? No  Activities of Daily Living In your present state of health, do you have any difficulty performing the following activities?:  Driving? Yes Managing money?  No Feeding yourself? No Getting from bed to chair? No Climbing a flight of stairs? No Preparing food and eating?: No Bathing or showering? No Getting dressed: No Getting to the toilet? No Using the toilet:No Moving around from place to place: No In the past year have you fallen or had a near fall?:No   Are you sexually active?  No  Do you have more than one partner?  No  Vision Difficulties: No  Hearing Difficulties: No Do you often ask people to speak up or repeat themselves? Yes Do you experience ringing or noises in your ears? No Do you have difficulty understanding soft or whispered voices? No  Cognition  Do you feel that you have a problem with memory?No  Do you often misplace items? No  Do you feel safe at home?  Yes  Advanced directives Does patient have a [place]? Yes Does patient have a Living Will? No   Objective:     Blood pressure 119/54, pulse 60, temperature 98.1 F (36.7 C), temperature source Oral, resp. rate 16, height 5\' 8"  (1.727 m), weight 146 lb (66.225 kg). Body mass index is 22.2 kg/(m^2).  General appearance: alert, no distress, WD/WN, male Cognitive  Testing  Alert? Yes  Normal Appearance?Yes  Oriented to person? Yes  Place? Yes   Time? Yes  Recall of three objects?  Yes  Can perform simple calculations? Yes  Displays appropriate judgment?Yes  Can read the correct time from a watch face?Yes  HEENT: normocephalic, sclerae anicteric, TMs pearly, nares patent, no discharge or erythema, pharynx normal Oral cavity: MMM, no lesions Neck: supple, no lymphadenopathy, no thyromegaly, no masses Heart: RRR, normal S1, S2, no murmurs Lungs: CTA bilaterally, no wheezes, rhonchi, or rales Abdomen: +bs, soft, non tender, non distended, no masses, no hepatomegaly, no splenomegaly Musculoskeletal: nontender, no swelling, no obvious deformity Extremities: no edema, no cyanosis, no clubbing Pulses: 2+ symmetric, upper and lower extremities, normal cap refill Neurological: alert, oriented x 3, CN2-12 intact, strength normal upper extremities and lower extremities, sensation normal throughout, DTRs 2+ throughout, no cerebellar signs, gait normal Rectal: Pt has normal; rectal tone. Prostate with smooth contours and no masses palpated. Genital: Testicles show no testicular masses. Psychiatric: normal affect, behavior normal, pleasant   Assessment:  Patient denies any difficulties at home. No trouble with ADLs, depression or falls. No recent changes to vision or hearing. Is UTD with immunizations. Is UTD with screening. Discussed Advanced Directives, patient agrees to bring [place] copies of documents if can. Encouraged healthy diet, exercise as tolerated and adequate sleep. Declines flu shot.   1. Essential hypertension - BP much better controlled on regimen of: Benazepril, Spironolactone and Furosemide. He was  Previously threatening to stop the Benazepril and resume Amlodipine and HCTZ  but is now compliant wit medications as ordered.  - Basic Metabolic Panel - Magnesium - Microalbumin, urine  2. Pain of right lower extremity - Pt is complaining of pain  that is localized around the toes. He has had simil;ar complaints and had an x-ray of the foot performed last year. It yielded no significant findings. He relates that he was told at some recent health care visit that his potassium was low.  Thus I will check his electrolytes to ensure the electrolyte imbalance is not contributing.  - Basic Metabolic Panel  3. Hypokalemia - Basic Metabolic Panel - Magnesium  4. [place] (hepatocellular carcinoma) - Pt currently under care of Dr. Lisette Abu and is doing well. - oxycodone (OXY-IR) 5 MG capsule; Take 1 capsule (5 mg total) by mouth every 4 (four) hours as needed.  Dispense: 30 capsule; Refill: 0  5. Pain of foot and toes - Pt has pain at the region of the MTP's L>R. Will refer to Podiatry. - Ambulatory referral to Podiatry  6.  Colon cancer screening - Pt has not had a screening colonoscopy since 2006. Due this year.Referral made to [name]. - Ambulatory referral to Gastroenterology      Plan:   During the course of the visit the patient was educated and counseled about appropriate screening and preventive services including:    Pneumococcal vaccine   Influenza vaccine  Td vaccine  Screening electrocardiogram  Bone densitometry screening  Colorectal cancer screening  Diabetes screening  Glaucoma screening  Nutrition counseling   Advanced directives: requested  Screening recommendations, referrals: Vaccinations: Please see documentation below and orders this visit.  Nutrition assessed and recommended  Colonoscopy ordered Recommended yearly ophthalmology/optometry visit for glaucoma screening and checkup Recommended yearly dental visit for hygiene and checkup Advanced directives - requested  Conditions/risks identified: BMI: Discussed weight loss, diet, and increase physical activity.  Increase physical activity: AHA recommends 150 minutes of physical activity a week.  Medications reviewed Urinary Incontinence is  not an issue: discussed non pharmacology and pharmacology options.  Fall risk: moderate- discussed PT, home fall assessment, medications.    Medicare Attestation I have personally reviewed: The patient's medical and social history Their use of alcohol, tobacco or illicit drugs Their current medications and supplements The patient's functional ability including ADLs,fall risks, home safety risks, cognitive, and hearing and visual impairment Diet and physical activities Evidence for depression or mood disorders  The patient's weight, height, BMI, and visual acuity have been recorded in the chart.  I have made referrals, counseling, and provided education to the patient based on review of the above and I have provided the patient with a written personalized care plan for preventive services.     [name],[name], MD   02/23/2015

## 2015-02-24 LAB — MICROALBUMIN, URINE: Microalb, Ur: 0.2 mg/dL (ref <2.0)

## 2015-03-02 ENCOUNTER — Telehealth: Payer: Self-pay

## 2015-03-02 DIAGNOSIS — I739 Peripheral vascular disease, unspecified: Secondary | ICD-10-CM

## 2015-03-02 NOTE — Telephone Encounter (Signed)
LM for pt re appt, dpm °

## 2015-03-02 NOTE — Telephone Encounter (Signed)
Phone call from pt.  Reported he has intermittent right foot pain.  Stated he has had the foot pain for "seven years."   Reported that his PCP has referred him to a Podiatrist.  Described the pain "like a vine growing around my foot, when I lay down." Stated "it hurts at times when I walk."  Also stated if he takes an Oxycodone 5 mg, one tablet, he won't have foot pain for about 3 days.  Denied any swelling of right foot.  Denied any open sores.  Noted pt. missed his 6 mo surveillance appt. in October.  Will reschedule appt. for ABI's, and to see Dr. Bridgett Larsson or the Nurse Practitioner.  Agrees with plan.  Pt. questioned if he needs to go to the [facility] to discuss this with his PCP.

## 2015-03-13 ENCOUNTER — Encounter: Payer: Self-pay | Admitting: Internal Medicine

## 2015-03-13 NOTE — Progress Notes (Signed)
Patient ID: Jeffrey Snow, male   DOB: [birth year], 59 y.o.   MRN: [id number] Pt refusing Colonoscopy in light of his Hepatocellular Carcinoma. This is a reasonable refusal as he has decided that he would not undergo any intervention should he have a colon cancer.

## 2015-03-15 ENCOUNTER — Ambulatory Visit (INDEPENDENT_AMBULATORY_CARE_PROVIDER_SITE_OTHER): Payer: Medicaid Other | Admitting: Podiatry

## 2015-03-15 ENCOUNTER — Telehealth: Payer: Self-pay | Admitting: Vascular Surgery

## 2015-03-15 ENCOUNTER — Ambulatory Visit (INDEPENDENT_AMBULATORY_CARE_PROVIDER_SITE_OTHER): Payer: Medicaid Other

## 2015-03-15 ENCOUNTER — Encounter: Payer: Self-pay | Admitting: Podiatry

## 2015-03-15 VITALS — BP 176/66 | HR 50 | Resp 17 | Ht 68.0 in | Wt 146.0 lb

## 2015-03-15 DIAGNOSIS — M79661 Pain in right lower leg: Secondary | ICD-10-CM

## 2015-03-15 DIAGNOSIS — I739 Peripheral vascular disease, unspecified: Secondary | ICD-10-CM

## 2015-03-15 DIAGNOSIS — M79671 Pain in right foot: Secondary | ICD-10-CM

## 2015-03-15 NOTE — Patient Instructions (Signed)
Okay to try soft foot ankle support for relief of pain Your foot pain is suggested of possible circulation insufficiency or possible nerve related problems that are undetermined at this time

## 2015-03-15 NOTE — Progress Notes (Signed)
   Subjective:    Patient ID: Jeffrey Snow, male    DOB: 12-01-1956, 59 y.o.   MRN: [id number]  HPI Comments: N foot pain L right foot and toes D 5 - 6 years O  C numbness, tingling and tightness A sleeping, health hx liver cancer treated with radiation, cirrhosis, and hepatitis C  T Dr. Geryl Councilman at [facility] performed angiogram, Dr. Zigmund Daniel prescribed Oxycodone for 3 month periods  Dr. Geryl Councilman at [facility] requests copy of today's office notes.  Foot Pain   Patient complains of a persistent numbness tingling in the right foot over multiple years without any progression of symptoms. He wants to know if a ankle support would be of any benefit for this problem. He has a history of peripheral arterial disease. He he has used oxycodone 5 mg prescribed every 4 hours as needed for pain with persistence of symptoms in the right foot/ankle  Review of Systems  Eyes: Positive for itching.  Cardiovascular:       Calf pain with walking. Feet constantly cold.  All other systems reviewed and are negative.      Objective:   Physical Exam  Patient appears orientated 3  Vascular: DP pulses 0/4 bilaterally PT pulses 0/4 bilaterally Capillary reflex immediate bilaterally Minor spider varicose veins ankles bilaterally No edema noted bilaterally  Neurological: Sensation to 10 g monofilament wire intact 5/5 bilaterally Vibratory sensation intact bilaterally Ankle reflex equal and reactive bilaterally Patient reaction to light pressure and touch on the right foot ankle area in a painful manner  Dermatological: Atrophic skin without any hair growth noted Feet are warm to touch bilaterally Neatly  trimmed toenails with occasional dystrophic changes 6-10  Musculoskeletal: Patient has limping gait favoring the right foot Is able to weakly heel off bilaterally Able to weakly heel off on the left Unable to heel off on the right Manual motor testing: Dorsi flexion ,plantar flexion ,inversion, eversion  5/5 bilaterally  X-ray examination weightbearing right ankle  Intact bony structure without fracture and/or dislocation Ankle mortise within normal limits Bone density adequate  Radiographic impression: No acute bony abnormality noted in the right ankle  X-ray examination right foot 2 views weightbearing  Intact bony structure without fracture and/or dislocation Joint spaces are adequate Mild decrease in bone density noted  Radiographic impression: No acute bony abnormality noted in the right foot    Assessment & Plan:   Assessment: Nonpalpable pedal pulses consistent with peripheral arterial disease Symptoms are suggestive of a neuropathy or a chronic pain syndrome There is no evidence of any significant dermatological changes or bony changes in the right foot/ankle which would indicate a regional pain syndrome  Plan: I discussed the findings of the examination and x-ray with patient today. I made aware that the ankle foot x-rays demonstrate no bony issues I told him that he has no palpable pedal pulse a shoes which is consistent with peripheral arterial disease  I suggested a return for follow-up evaluation for his vascular status He could be referred to neurologist to evaluate for neuropathy  In regards to his question about wearing an ankle support I see no contraindication to wearing a soft ankle support, however, I do not think it would reduce symptoms in the right foot and ankle  Patient states he has a pending follow-up evaluation with vascular surgeon

## 2015-03-15 NOTE — Telephone Encounter (Signed)
LM for patient that right now, we do not have any openings before 04/07- but I have placed him on the wait list. Advised pt to call if he has any questions. dm

## 2015-03-15 NOTE — Telephone Encounter (Signed)
-----   Message from Denman George, RN sent at 03/15/2015  9:22 AM EDT ----- Regarding: request for appt. change Contact: [phone] He is asking for an earlier appt. time on 4/7; any options for him?

## 2015-03-22 ENCOUNTER — Encounter: Payer: Self-pay | Admitting: Family

## 2015-03-23 ENCOUNTER — Ambulatory Visit: Payer: Medicaid Other | Admitting: Family

## 2015-03-23 ENCOUNTER — Ambulatory Visit (HOSPITAL_COMMUNITY)
Admission: RE | Admit: 2015-03-23 | Discharge: 2015-03-23 | Disposition: A | Discharge status: Home / Self Care | Payer: Medicaid Other | Source: Ambulatory Visit | Attending: Vascular Surgery | Admitting: Vascular Surgery

## 2015-03-23 ENCOUNTER — Ambulatory Visit (INDEPENDENT_AMBULATORY_CARE_PROVIDER_SITE_OTHER): Payer: Medicaid Other | Admitting: Family

## 2015-03-23 ENCOUNTER — Encounter (HOSPITAL_COMMUNITY): Payer: Medicaid Other

## 2015-03-23 ENCOUNTER — Encounter: Payer: Self-pay | Admitting: Family

## 2015-03-23 VITALS — BP 110/62 | HR 54 | Resp 16 | Ht 68.0 in | Wt 149.0 lb

## 2015-03-23 DIAGNOSIS — I739 Peripheral vascular disease, unspecified: Secondary | ICD-10-CM

## 2015-03-23 DIAGNOSIS — Z87891 Personal history of nicotine dependence: Secondary | ICD-10-CM

## 2015-03-23 NOTE — Patient Instructions (Signed)

## 2015-03-23 NOTE — Progress Notes (Signed)
[facility] [place] HISTORY AND PHYSICAL -PAD  History of Present Illness Jeffrey Snow is a 59 y.o. male patient of Dr. Bridgett Larsson who is s/p angiogram with B leg runoff on 12/27/13: B leg runoff. That angiogram demonstrated: R distal SFA occlusion with essentially hypertrophied collaterals from the R profunda femoral artery which constitute a bypass of the R distal SFA occlusion, L popliteal artery is occluded at the knee but numerous geniculate arteries bypass the short segment occlusion. The patient notes stable intermittent claudication sx and persistent numbness in feet. The patient is able to complete their activities of daily living. He states he missed his scheduled follow up as he had other medical appointments.  He has no non healing wounds.   He has a history of liver cancer, Dr. Shan Levans is his oncologist.    Pt Diabetic: No Pt smoker: former smoker, quit in 2012  Pt meds include: Statin :No ASA: No Other anticoagulants/antiplatelets: no  Past Medical History  Diagnosis Date  . Depression   . Anxiety   . Panic attack   . Hypertension   . Hepatitis C   . Claudication of left lower extremity m-1  . Abnormal MRI of abdomen, liver  04/21/2014  . Cirrhosis, alcoholic 01/20/2352  . Liver mass   . [place] (hepatocellular carcinoma)     Social History History  Substance Use Topics  . Smoking status: Former Smoker -- 0.50 packs/day    Quit date: 08/21/2011  . Smokeless tobacco: Never Used  . Alcohol Use: No     Comment: Occasional beer on the weekends    Family History Family History  Problem Relation Age of Onset  . Diabetes Father   . Diabetes Brother   . Diabetes Brother     Past Surgical History  Procedure Laterality Date  . Cosmetic surgery      facial reconstructive surgery  . Aortogram    . Abdominal aortagram N/A 12/27/2013    Procedure: ABDOMINAL Maxcine Ham;  Surgeon: Conrad Avoca, MD;  Location: [facility] CATH LAB;  Service:  Cardiovascular;  Laterality: N/A;  . Lower extremity angiogram Bilateral 12/27/2013    Procedure: LOWER EXTREMITY ANGIOGRAM;  Surgeon: Conrad Kanopolis, MD;  Location: [facility] CATH LAB;  Service: Cardiovascular;  Laterality: Bilateral;  . Esophagogastroduodenoscopy N/A 12/17/2014    Procedure: ESOPHAGOGASTRODUODENOSCOPY (EGD);  Surgeon: Missy Sabins, MD;  Location: [facility] ENDOSCOPY;  Service: Endoscopy;  Laterality: N/A;    Allergies  Allergen Reactions  . Folic Acid Other (See Comments)    Makes stomach feel funny    Current Outpatient Prescriptions  Medication Sig Dispense Refill  . ALPRAZolam (XANAX) 0.5 MG tablet Take 0.5 mg by mouth 3 (three) times daily as needed for anxiety.     . benazepril (LOTENSIN) 10 MG tablet Take 1 tablet (10 mg total) by mouth daily. 90 tablet 3  . bisacodyl (DULCOLAX) 5 MG EC tablet Take 5 mg by mouth daily as needed for mild constipation or moderate constipation.    . docusate sodium 100 MG CAPS Take 100 mg by mouth 2 (two) times daily. 20 capsule 0  . ergocalciferol (DRISDOL) 50000 UNITS capsule Take 1 capsule (50,000 Units total) by mouth once a week. (Patient not taking: Reported on 03/15/2015) 4 capsule 12  . FLUoxetine (PROZAC) 20 MG capsule Take 20 mg by mouth every morning.     . folic acid (FOLVITE) 1 MG tablet Take 1 tablet (1 mg total) by mouth daily. 30 tablet 11  .  oxycodone (OXY-IR) 5 MG capsule Take 1 capsule (5 mg total) by mouth every 4 (four) hours as needed. 30 capsule 0  . potassium chloride SA (K-DUR,KLOR-CON) 20 MEQ tablet Take 2 tablets (40 mEq total) by mouth daily. For 5 days (Patient not taking: Reported on 01/19/2015) 10 tablet 0  . spironolactone (ALDACTONE) 25 MG tablet Take 25 mg by mouth 3 (three) times daily.     Marland Kitchen thiamine 100 MG tablet Take 1 tablet (100 mg total) by mouth daily. 30 tablet 0  . zolpidem (AMBIEN) 10 MG tablet Take 10 mg by mouth at bedtime.      No current facility-administered medications for this visit.    ROS: See HPI  for pertinent positives and negatives.   Physical Examination  Filed Vitals:   03/23/15 1318  BP: 110/62  Pulse: 54  Resp: 16  Height: 5\' 8"  (1.727 m)  Weight: 149 lb (67.586 kg)   Body mass index is 22.66 kg/(m^2).  General: A&O x 3, WDWN  Pulmonary: Sym exp, good air movt, CTAB, no rales, rhonchi, & wheezin  Cardiac: RRR, Nl S1, S2, no detected murmur  Vascular: Vessel Right Left  Radial Palpable Palpable  Brachial Palpable Palpable  Carotid Palpable, without bruit Palpable, without bruit  Aorta Not palpable N/A  Femoral Palpable Palpable  Popliteal Not palpable Not palpable  PT Not Palpable Not Palpable  DP Not Palpable Not Palpable   Gastrointestinal: soft, NTND, -G/R, - HSM, - palpable masses, - CVAT B  Musculoskeletal: M/S 5/5 throughout , Extremities without ischemic changes , L groin without hematoma, no echymosis present at cannulation site, B edema 1+, some LDS B, B leg rash improved  Neurologic: Pain and light touch intact in extremities , Motor exam as listed above          Non-Invasive Vascular Imaging: DATE: 03/23/2015 ABI: RIGHT 0.62 (09/03/13, 0.70), Waveforms: monophasic, TBI: 0.38;  LEFT 0.52 (0.69), Waveforms: monophasic, TBI: 0.42   ASSESSMENT: Jeffrey Snow is a 59 y.o. male who is s/p angiogram with B leg runoff on 12/27/13: B leg runoff. That angiogram demonstrated: R distal SFA occlusion with essentially hypertrophied collaterals from the R profunda femoral artery which constitute a bypass of the R distal SFA occlusion, L popliteal artery is occluded at the knee but numerous geniculate arteries bypass the short segment occlusion. The patient notes stable intermittent claudication sx and persistent numbness in feet. The patient is able to complete their activities of daily living. He states he missed his scheduled follow up as he had other medical appointments.  He has no non healing wounds.   On Dr. Lianne Moris  March 2015 progress note he indicates he doubts that this patient's peripheral arterial disease accounts for his anesthesia in his feet, as he essentially has chronic hypertrophied collaterals that have bypassed his occlusions. We discussed that he can be surgically reconstructed, but as he only has claudication and those bypasses only have 5-yr patencies not 10-yr, it does NOT make sense to proceed at this point.  PLAN:  I discussed in depth with the patient the nature of atherosclerosis, and emphasized the importance of maximal medical management including strict control of blood pressure, blood glucose, and lipid levels, obtaining regular exercise, and continued cessation of smoking.  The patient is aware that without maximal medical management the underlying atherosclerotic disease process will progress, limiting the benefit of any interventions.  Based on the patient's vascular studies and examination, pt will return to clinic on 4/8 or 04/14/15,  pt chose 04/14/15 to see Dr. Bridgett Larsson and discuss whether revascularization is an option.  The patient was given information about PAD including signs, symptoms, treatment, what symptoms should prompt the patient to seek immediate medical care, and risk reduction measures to take.  Clemon Chambers, RN, MSN, FNP-C [facility] [employer] Phone: [phone]  Clinic MD: Oneida Alar  03/23/2015 1:16 PM

## 2015-04-14 ENCOUNTER — Ambulatory Visit: Payer: Self-pay | Admitting: Vascular Surgery

## 2015-04-21 ENCOUNTER — Ambulatory Visit: Payer: Medicaid Other | Admitting: Vascular Surgery

## 2015-04-27 ENCOUNTER — Other Ambulatory Visit: Payer: Self-pay | Admitting: *Deleted

## 2015-04-27 ENCOUNTER — Other Ambulatory Visit (HOSPITAL_COMMUNITY): Payer: Self-pay | Admitting: Interventional Radiology

## 2015-04-27 DIAGNOSIS — C220 Liver cell carcinoma: Secondary | ICD-10-CM

## 2015-05-04 ENCOUNTER — Encounter: Payer: Self-pay | Admitting: Vascular Surgery

## 2015-05-05 ENCOUNTER — Ambulatory Visit: Payer: Medicaid Other | Admitting: Vascular Surgery

## 2015-05-22 ENCOUNTER — Other Ambulatory Visit (HOSPITAL_COMMUNITY): Payer: Self-pay | Admitting: Interventional Radiology

## 2015-05-23 ENCOUNTER — Other Ambulatory Visit (HOSPITAL_COMMUNITY): Payer: Self-pay

## 2015-05-23 LAB — COMPLETE METABOLIC PANEL WITH GFR
ALT: 29 U/L (ref 0–53)
AST: 24 U/L (ref 0–37)
Albumin: 3.4 g/dL — ABNORMAL LOW (ref 3.5–5.2)
Alkaline Phosphatase: 111 U/L (ref 39–117)
BUN: 16 mg/dL (ref 6–23)
CO2: 20 mEq/L (ref 19–32)
Calcium: 8.8 mg/dL (ref 8.4–10.5)
Chloride: 104 mEq/L (ref 96–112)
Creat: 0.96 mg/dL (ref 0.50–1.35)
GFR, Est African American: >89 mL/min
GFR, Est Non African American: 86 mL/min
Glucose, Bld: 149 mg/dL — ABNORMAL HIGH (ref 70–99)
Potassium: 4 mEq/L (ref 3.5–5.3)
Sodium: 134 mEq/L — ABNORMAL LOW (ref 135–145)
Total Bilirubin: 0.7 mg/dL (ref 0.2–1.2)
Total Protein: 6.1 g/dL (ref 6.0–8.3)

## 2015-05-23 LAB — CBC
HCT: 38.4 % — ABNORMAL LOW (ref 39.0–52.0)
Hemoglobin: 12.5 g/dL — ABNORMAL LOW (ref 13.0–17.0)
MCH: 32 pg (ref 26.0–34.0)
MCHC: 32.6 g/dL (ref 30.0–36.0)
MCV: 98.2 fL (ref 78.0–100.0)
MPV: 12.2 fL (ref 8.6–12.4)
Platelets: 58 10*3/uL — ABNORMAL LOW (ref 150–400)
RBC: 3.91 MIL/uL — ABNORMAL LOW (ref 4.22–5.81)
RDW: 14.6 % (ref 11.5–15.5)
WBC: 3.6 10*3/uL — ABNORMAL LOW (ref 4.0–10.5)

## 2015-05-23 LAB — PROTIME-INR
INR: 1.33 (ref <1.50)
Prothrombin Time: 16.5 seconds — ABNORMAL HIGH (ref 11.6–15.2)

## 2015-05-29 ENCOUNTER — Ambulatory Visit: Payer: Self-pay | Admitting: Internal Medicine

## 2015-05-30 ENCOUNTER — Ambulatory Visit (HOSPITAL_COMMUNITY)
Admission: RE | Admit: 2015-05-30 | Discharge: 2015-05-30 | Disposition: A | Discharge status: Home / Self Care | Payer: Medicaid Other | Source: Ambulatory Visit | Attending: Interventional Radiology | Admitting: Interventional Radiology

## 2015-05-30 ENCOUNTER — Ambulatory Visit
Admission: RE | Admit: 2015-05-30 | Discharge: 2015-05-30 | Disposition: A | Discharge status: Home / Self Care | Payer: Medicaid Other | Source: Ambulatory Visit | Attending: Interventional Radiology | Admitting: Interventional Radiology

## 2015-05-30 DIAGNOSIS — K8020 Calculus of gallbladder without cholecystitis without obstruction: Secondary | ICD-10-CM | POA: Diagnosis not present

## 2015-05-30 DIAGNOSIS — C220 Liver cell carcinoma: Secondary | ICD-10-CM | POA: Insufficient documentation

## 2015-05-30 DIAGNOSIS — R161 Splenomegaly, not elsewhere classified: Secondary | ICD-10-CM | POA: Insufficient documentation

## 2015-05-30 DIAGNOSIS — K766 Portal hypertension: Secondary | ICD-10-CM | POA: Insufficient documentation

## 2015-05-30 DIAGNOSIS — B1920 Unspecified viral hepatitis C without hepatic coma: Secondary | ICD-10-CM | POA: Insufficient documentation

## 2015-05-30 DIAGNOSIS — N62 Hypertrophy of breast: Secondary | ICD-10-CM | POA: Diagnosis not present

## 2015-05-30 DIAGNOSIS — K7030 Alcoholic cirrhosis of liver without ascites: Secondary | ICD-10-CM | POA: Insufficient documentation

## 2015-05-30 HISTORY — PX: IR GENERIC HISTORICAL: IMG1180011

## 2015-05-30 MED ORDER — GADOXETATE DISODIUM 0.25 MMOL/ML IV SOLN
10.0000 mL | Freq: Once | INTRAVENOUS | Status: AC | PRN
  Administered 2015-05-30: 6 mL via INTRAVENOUS

## 2015-05-30 NOTE — Progress Notes (Signed)
Chief Complaint: Chief Complaint  Patient presents with  . Follow-up    follow up DEB-TACE/thermal ablation of Liver      Referring Physician(s): Kelso Bibby  History of Present Illness: Jeffrey Snow is a 59 y.o. male with alcoholic and HCV cirrhosis complicated by hepatocellular carcinoma, ascites and esophageal varices. He was treated by combination drug-eluting bead transarterial chemoembolization followed by microwave thermal ablation on 6/25 and 06/10/2014. Follow-up surveillance imaging has demonstrated a complete response to therapy to this point.   Jeffrey Snow presents today for his (nearly) 1 year follow-up evaluation. His MRI today again shows a contracting prior treatment site with no evidence of nodularity or enhancement to suggest residual, recurrent or new hepatocellular carcinoma.  Clinically, Jeffrey Snow continues to do well. His ascites is very well managed. I see almost no ascitic fluid on his abdominal MRI and he has no current complaints of abdominal swelling or fluid. His energy level is good. He notes that he has been cutting out some medications over the past several months that he feel may not be offering him benefit. I encouraged him to be sure that he communicates with the doctor who prescribed these medicines for him before stopping anything.  I also encouraged him of the great importance of continuing to follow-up with [facility] liver care and Benancio Deeds, NP. They're doing a fantastic job of helping him manage his ascites and will continue to discuss the possibility of his candidacy for HCV treatment, or potentially transplant in the future.  Past Medical History  Diagnosis Date  . Depression   . Anxiety   . Panic attack   . Hypertension   . Hepatitis C   . Claudication of left lower extremity m-1  . Abnormal MRI of abdomen, liver  04/21/2014  . Cirrhosis, alcoholic 02/17/7424  . Liver mass   . [place] (hepatocellular carcinoma)     Past Surgical  History  Procedure Laterality Date  . Cosmetic surgery      facial reconstructive surgery  . Aortogram    . Abdominal aortagram N/A 12/27/2013    Procedure: ABDOMINAL Maxcine Ham;  Surgeon: Conrad Glendive, MD;  Location: [facility] CATH LAB;  Service: Cardiovascular;  Laterality: N/A;  . Lower extremity angiogram Bilateral 12/27/2013    Procedure: LOWER EXTREMITY ANGIOGRAM;  Surgeon: Conrad Valparaiso, MD;  Location: [facility] CATH LAB;  Service: Cardiovascular;  Laterality: Bilateral;  . Esophagogastroduodenoscopy N/A 12/17/2014    Procedure: ESOPHAGOGASTRODUODENOSCOPY (EGD);  Surgeon: Missy Sabins, MD;  Location: [facility];  Service: Endoscopy;  Laterality: N/A;    Allergies: Folic acid  Medications: Prior to Admission medications   Medication Sig Start Date End Date Taking? Authorizing Provider  ALPRAZolam Duanne Moron) 0.5 MG tablet Take 0.5 mg by mouth 3 (three) times daily as needed for anxiety.    Yes Historical Provider, MD  aspirin 81 MG tablet Take 81 mg by mouth daily.   Yes Historical Provider, MD  benazepril (LOTENSIN) 10 MG tablet Take 1 tablet (10 mg total) by mouth daily. 01/12/15  Yes Leana Gamer, MD  bisacodyl (DULCOLAX) 5 MG EC tablet Take 5 mg by mouth daily as needed for mild constipation or moderate constipation.   Yes Historical Provider, MD  docusate sodium 100 MG CAPS Take 100 mg by mouth 2 (two) times daily. 06/11/14  Yes Ascencion Dike, PA-C  FLUoxetine (PROZAC) 20 MG capsule Take 20 mg by mouth every morning.    Yes Historical Provider, MD  furosemide (LASIX) 20 MG tablet  Take 20 mg by mouth daily.   Yes Historical Provider, MD  spironolactone (ALDACTONE) 25 MG tablet Take 25 mg by mouth 3 (three) times daily.    Yes Historical Provider, MD  zolpidem (AMBIEN) 10 MG tablet Take 10 mg by mouth at bedtime.    Yes Historical Provider, MD  ergocalciferol (DRISDOL) 50000 UNITS capsule Take 1 capsule (50,000 Units total) by mouth once a week. Patient not taking: Reported on 03/15/2015 03/31/14    Dorena Dew, FNP  folic acid (FOLVITE) 1 MG tablet Take 1 tablet (1 mg total) by mouth daily. Patient not taking: Reported on 03/23/2015 07/06/14   Leana Gamer, MD  oxycodone (OXY-IR) 5 MG capsule Take 1 capsule (5 mg total) by mouth every 4 (four) hours as needed. Patient not taking: Reported on 05/30/2015 02/23/15   Leana Gamer, MD  potassium chloride SA (K-DUR,KLOR-CON) 20 MEQ tablet Take 2 tablets (40 mEq total) by mouth daily. For 5 days Patient not taking: Reported on 01/19/2015 12/17/14   Eugenie Filler, MD  thiamine 100 MG tablet Take 1 tablet (100 mg total) by mouth daily. Patient not taking: Reported on 05/30/2015 05/02/14   Modena Jansky, MD     Family History  Problem Relation Age of Onset  . Diabetes Father   . Diabetes Brother   . Diabetes Brother     History   Social History  . Marital Status: Single    Spouse Name: N/A  . Number of Children: 3  . Years of Education: N/A   Occupational History  .  [employer]   Social History Main Topics  . Smoking status: Former Smoker -- 0.50 packs/day    Quit date: 08/21/2011  . Smokeless tobacco: Never Used  . Alcohol Use: No     Comment: Occasional beer on the weekends  . Drug Use: No     Comment: abused cocaine in the 80's  . Sexual Activity: Not on file   Other Topics Concern  . Not on file   Social History Narrative   Single, never married.  Lives alone.  3 daughters, 16 grandchildren who are not very involved in the patient's care.  Quit smoking and heavy drinking 3.5 years ago.     ECOG Status: 0 - Asymptomatic  Review of Systems: A 12 point ROS discussed and pertinent positives are indicated in the HPI above.  All other systems are negative.  Review of Systems  Vital Signs: BP 143/61 mmHg  Pulse 51  Temp(Src) 97.8 F (36.6 C) (Oral)  Resp 14  Ht 5' 8.5" (1.74 m)  Wt 152 lb (68.947 kg)  BMI 22.77 kg/m2  SpO2 98%  Physical Exam  Constitutional: He is oriented to person, place,  and time. He appears well-developed and well-nourished. No distress.  HENT:  Head: Normocephalic and atraumatic.  Eyes: No scleral icterus.  Cardiovascular: Normal rate and regular rhythm.   Pulmonary/Chest: Effort normal.  Abdominal: Soft. He exhibits no distension and no mass. There is no tenderness.  Neurological: He is alert and oriented to person, place, and time.  Skin: Skin is warm and dry.  Psychiatric: He has a normal mood and affect. His behavior is normal.  Nursing note and vitals reviewed.   Imaging: Mr Abdomen W Wo Contrast  05/30/2015   CLINICAL DATA:  Alcoholic and hepatitis-C cirrhosis. Hepatocellular carcinoma. Treated with a combination of trans arterial chemoembolization and thermal ablation in June of 2015. No new symptoms or complaints.  EXAM: MRI  ABDOMEN WITHOUT AND WITH CONTRAST  TECHNIQUE: Multiplanar multisequence MR imaging of the abdomen was performed both before and after the administration of intravenous contrast.  CONTRAST:  10.0 ml Eovist, a mixed extracellular and hepatocyte specific contrast agent.  COMPARISON:  01/19/2015  FINDINGS: Lower chest: Normal heart size without pericardial or pleural effusion. Bilateral gynecomastia.  Hepatobiliary: Moderate cirrhosis. The treated area in the posterior segment right liver lobe (segment 7) measures 3.3 x 3.4 cm on image 18 of series 3. Decreased in size from 3.4 x 3.8 cm on the prior exam. Demonstrates mild T2 hypo intensity and T1 hyperintensity prior to contrast. No post-contrast enhancement.  No new liver lesions. Multiple gallstones without acute cholecystitis or biliary ductal dilatation.  Pancreas: Normal, without mass or ductal dilatation.  Spleen: Splenomegaly, 16.8 cm craniocaudal. Arterial and portal venous phase heterogeneous enhancement within the central spleen, including on image 62 of series 503, favored to be related to portal venous hypertension.  Adrenals/Urinary Tract: Normal adrenal glands. Normal kidneys,  without hydronephrosis.  Stomach/Bowel: Normal stomach and abdominal bowel loops.  Vascular/Lymphatic: Aortic and branch vessel atherosclerosis. No splenic artery aneurysm. Portal venous hypertension, with portosystemic collaterals in left upper quadrant including superior to the left kidney. Hepatic and portal veins patent. Prominent porta hepatis nodes are felt to be similar. Example 1.4 cm in the portacaval space on image 31 of series 8. A left periaortic node measures 8 mm and is similar, not pathologic by size criteria.  Other: Near complete resolution of abdominal ascites. Trace perihepatic and perisplenic fluid remains.  Musculoskeletal: No acute osseous abnormality. Mild S-shaped thoracolumbar spine curvature.  IMPRESSION: 1. Decreased size of site of treatment in the right lobe of the liver. No findings to suggest recurrent, residual, or metachronous hepatocellular carcinoma. 2. Cirrhosis and portal venous hypertension. 3. Cholelithiasis. 4. Near complete resolution of abdominal ascites. 5. Bilateral gynecomastia.   Electronically Signed   By: Abigail Miyamoto M.D.   On: 05/30/2015 10:16    Labs:  CBC:  Recent Labs  12/17/14 0535 12/17/14 1237 12/31/14 0700 05/22/15 0716  WBC 4.0 4.3 3.9* 3.6*  HGB 9.7* 9.4* 11.2* 12.5*  HCT 28.9* 27.6* 34.9* 38.4*  PLT 55* 53* 64* 58*    COAGS:  Recent Labs  06/09/14 0918 09/26/14 0705 12/16/14 1244 12/17/14 0535 05/22/15 0716  INR 1.50* 1.50* 1.50* 1.40 CANCELED  1.33  APTT 32  --  31  --   --     BMP:  Recent Labs  12/16/14 1140 12/17/14 0535 12/31/14 0700 01/12/15 1127 02/23/15 1457 05/22/15 0716  NA 135 136 133* 136 138 134*  K 2.8* 3.2* 3.7 3.5 4.3 4.0  CL 101 104 98 100 104 104  CO2 25 26 27 27 25 20   GLUCOSE 140* 85 95 71 82 149*  BUN 47* 33* 13 9 18 16   CALCIUM 8.9 8.4 8.4 8.4 8.7 8.8  CREATININE 0.96 0.86 0.78 0.76 0.84 0.96  GFRNONAA 89* >90 >90  --   --  86  GFRAA >90 >90 >90  --   --  >89    LIVER FUNCTION  TESTS:  Recent Labs  12/16/14 1140 12/17/14 0535 12/31/14 0700 05/22/15 0716  BILITOT 1.3* 2.4* 1.5* 0.7  AST 53* 43* 37 24  ALT 32 32 27 29  ALKPHOS 69 66 114 111  PROT 5.3* 5.1* 6.0 6.1  ALBUMIN 2.9* 3.0* 2.9* 3.4*    TUMOR MARKERS: No results for input(s): AFPTM, CEA, CA199, CHROMGRNA in the last 8760 hours.  Assessment and Plan:  Jeffrey Snow continues to do exceptionally well status post combined transarterial chemoembolization with drug-eluting beads followed by percutaneous thermal ablation with microwave. By MRI evaluation today, there is no evidence of residual or recurrent hepatocellular carcinoma at the site of treatment and no evidence of new disease elsewhere in the liver.  He is now 1 year cancer free.  [place] - At this point, I believe we can extend surveillance imaging to every 6 months.  - I will see him again at 6 months after his next MRI with and without contrast.  - Given his continued sobriety which now extends just over a year, he may become a transplant candidate at some point in the future. I will leave this to Roosevelt Locks, NP with [facility] as I know he is a very complicated patient with both hepatitis C and a fluctuating support network.  Cirrhosis - Ascites currently well managed - F/U with [facility] for eventual management of his HCV if he becomes an acceptable candidate   Signed: [place], [place] 05/30/2015, 11:41 AM   I spent a total of 10 Minutes in face to face in clinical consultation, greater than 50% of which was counseling/coordinating care for hepatocellular carcinoma.

## 2015-06-01 ENCOUNTER — Ambulatory Visit (INDEPENDENT_AMBULATORY_CARE_PROVIDER_SITE_OTHER): Payer: Medicaid Other | Admitting: Internal Medicine

## 2015-06-01 VITALS — BP 117/58 | HR 59 | Temp 98.1°F | Resp 16 | Ht 68.5 in | Wt 153.0 lb

## 2015-06-01 DIAGNOSIS — M79604 Pain in right leg: Secondary | ICD-10-CM

## 2015-06-01 DIAGNOSIS — R739 Hyperglycemia, unspecified: Secondary | ICD-10-CM | POA: Diagnosis not present

## 2015-06-01 DIAGNOSIS — C220 Liver cell carcinoma: Secondary | ICD-10-CM | POA: Diagnosis not present

## 2015-06-01 DIAGNOSIS — I10 Essential (primary) hypertension: Secondary | ICD-10-CM | POA: Diagnosis not present

## 2015-06-01 LAB — POCT URINALYSIS DIP (DEVICE)
Bilirubin Urine: NEGATIVE
Glucose, UA: NEGATIVE mg/dL
Hgb urine dipstick: NEGATIVE
Ketones, ur: NEGATIVE mg/dL
Nitrite: NEGATIVE
Protein, ur: NEGATIVE mg/dL
Specific Gravity, Urine: 1.01 (ref 1.005–1.030)
Urobilinogen, UA: 0.2 mg/dL (ref 0.0–1.0)
pH: 6 (ref 5.0–8.0)

## 2015-06-01 MED ORDER — OXYCODONE HCL 5 MG PO CAPS: 5.0000 mg | ORAL_CAPSULE | ORAL | Status: DC | PRN

## 2015-06-02 ENCOUNTER — Telehealth: Payer: Self-pay

## 2015-06-02 DIAGNOSIS — R1011 Right upper quadrant pain: Secondary | ICD-10-CM

## 2015-06-02 DIAGNOSIS — C220 Liver cell carcinoma: Secondary | ICD-10-CM

## 2015-06-02 DIAGNOSIS — G8929 Other chronic pain: Secondary | ICD-10-CM

## 2015-06-02 LAB — HEMOGLOBIN A1C
Hgb A1c MFr Bld: 5.6 % (ref <5.7)
Mean Plasma Glucose: 114 mg/dL (ref <117)

## 2015-06-02 MED ORDER — OXYCODONE HCL 5 MG PO CAPS: 5.0000 mg | ORAL_CAPSULE | ORAL | Status: DC | PRN

## 2015-06-02 NOTE — Telephone Encounter (Signed)
Jeffrey Snow, is a 59 year old patient with a history of chronic pain that was evaluated on 06/01/2015 by Dr. Liston Alba. During the office visit, Jeffrey Snow was unable to get written prescription due to printer issues. Dr. Zigmund Daniel reviewed medications and performed a pill count on 06/01/2015.  I will reprint the prescription on today for Oxycodone 5 mg every 4 hours as needed for moderate to severe pain #30.   Meds ordered this encounter  Medications  . oxycodone (OXY-IR) 5 MG capsule    Sig: Take 1 capsule (5 mg total) by mouth every 4 (four) hours as needed.    Dispense:  30 capsule    Refill:  0    Order Specific Question:  Supervising Provider    Answer:  Liston Alba A [[id number]]   Dorena Dew, FNP

## 2015-06-02 NOTE — Telephone Encounter (Signed)
Refill request for oxycodone 5mg  LOV was 06/01/2015 with Dr. Zigmund Daniel. Please advise. Thanks!

## 2015-06-05 ENCOUNTER — Encounter: Payer: Self-pay | Admitting: Internal Medicine

## 2015-06-05 NOTE — Progress Notes (Signed)
Patient ID: Jeffrey Snow, male   DOB: [birth year], 59 y.o.   MRN: [id number]  HPI 59 y.o. male  presents for 3 month follow up with hypertension, hyperlipidemia, diabetes and vitamin D. His blood pressure has been controlled at home, today their BP is BP: (!) 117/58 mmHg He does not workout. He denies chest pain, shortness of breath, dizziness.  He is not on cholesterol medication and denies myalgias. His cholesterol is at goal. The cholesterol was:  01/26/2015: Cholesterol 113; HDL 35*; LDL Cholesterol 69; Triglycerides 46 Last A1C was: 06/01/2015: Hgb A1c MFr Bld 5.6 Patient is on Vitamin D supplement. No results found for requested labs within last 365 days.  Current Medications:  Current Outpatient Prescriptions on File Prior to Visit  Medication Sig Dispense Refill  . ALPRAZolam (XANAX) 0.5 MG tablet Take 0.5 mg by mouth 3 (three) times daily as needed for anxiety.     Marland Kitchen aspirin 81 MG tablet Take 81 mg by mouth daily.    . benazepril (LOTENSIN) 10 MG tablet Take 1 tablet (10 mg total) by mouth daily. 90 tablet 3  . bisacodyl (DULCOLAX) 5 MG EC tablet Take 5 mg by mouth daily as needed for mild constipation or moderate constipation.    . docusate sodium 100 MG CAPS Take 100 mg by mouth 2 (two) times daily. 20 capsule 0  . ergocalciferol (DRISDOL) 50000 UNITS capsule Take 1 capsule (50,000 Units total) by mouth once a week. 4 capsule 12  . FLUoxetine (PROZAC) 20 MG capsule Take 20 mg by mouth every morning.     . folic acid (FOLVITE) 1 MG tablet Take 1 tablet (1 mg total) by mouth daily. 30 tablet 11  . furosemide (LASIX) 20 MG tablet Take 20 mg by mouth daily.    . potassium chloride SA (K-DUR,KLOR-CON) 20 MEQ tablet Take 2 tablets (40 mEq total) by mouth daily. For 5 days 10 tablet 0  . spironolactone (ALDACTONE) 25 MG tablet Take 25 mg by mouth 3 (three) times daily.     Marland Kitchen thiamine 100 MG tablet Take 1 tablet (100 mg total) by mouth daily. 30 tablet 0  . zolpidem (AMBIEN) 10 MG tablet  Take 10 mg by mouth at bedtime.      No current facility-administered medications on file prior to visit.   Medical History:  Past Medical History  Diagnosis Date  . Depression   . Anxiety   . Panic attack   . Hypertension   . Hepatitis C   . Claudication of left lower extremity m-1  . Abnormal MRI of abdomen, liver  04/21/2014  . Cirrhosis, alcoholic 06/20/1606  . Liver mass   . HCC (hepatocellular carcinoma)    Allergies:  Allergies  Allergen Reactions  . Folic Acid Other (See Comments)    Makes stomach feel funny     Review of Systems:  Review of Systems  Constitutional: Negative.   HENT: Negative.   Eyes: Negative.   Respiratory: Negative.   Cardiovascular: Negative.   Gastrointestinal: Negative.   Genitourinary: Negative.   Musculoskeletal: Negative.        Chronic pain in right leg.   Skin: Negative.   Neurological: Negative.   Endo/Heme/Allergies: Negative.   Psychiatric/Behavioral: Negative.     Family history- Review and unchanged Social history- Review and unchanged Physical Exam: BP 117/58 mmHg  Pulse 59  Temp(Src) 98.1 F (36.7 C) (Oral)  Resp 16  Ht 5' 8.5" (1.74 m)  Wt 153 lb (69.4 kg)  BMI  22.92 kg/m2 Wt Readings from Last 3 Encounters:  06/01/15 153 lb (69.4 kg)  05/30/15 152 lb (68.947 kg)  03/23/15 149 lb (67.586 kg)   General Appearance: Well nourished, in no apparent distress. Eyes: PERRLA, EOMs, conjunctiva no swelling or erythema Sinuses: No Frontal/maxillary tenderness ENT/Mouth: Ext aud canals clear, TMs without erythema, bulging. No erythema, swelling, or exudate on post pharynx.  Tonsils not swollen or erythematous. Hearing normal.  Neck: Supple, thyroid normal. No JVD or Carotid Bruit Respiratory: Respiratory effort normal, BS equal bilaterally without rales, rhonchi, wheezing or stridor.  Cardio: RRR with no MRGs. Brisk peripheral pulses without edema.  Abdomen: Soft, + BS.  Non tender, no guarding, rebound, hernias,  masses. Lymphatics: Non tender without lymphadenopathy.  Musculoskeletal: Full ROM, normal strength, normal gait. Pulses 2+ in BLE's Skin: Warm, dry without rashes, lesions, ecchymosis.  Neuro: Cranial nerves intact. No cerebellar symptoms. Sensation intact.  Psych: Awake and oriented X 3, normal affect, Insight and Judgment appropriate. Good recent and remote recall       Assessment and Plan:  1. Pain of right lower extremity - Pt has continued pain in RLE. He has been evaluated by Vascular and vein specialists. - Will refill prescription for Oxycodone-IR 5 mg. Pt brought pills in today and has used 26 pills in 3 months. - He is high risk due to his history of ETOH'ism in the past and requires evaluation of opiate use within 1 month of prescription.   2. Hyperglycemia - Hemoglobin A1C  3. Essential hypertension - BP well controlled and will continue current medications.  - Urinalysis, Routine w reflex microscopic (not at [facility]); Standing - Urinalysis, Routine w reflex microscopic (not at [facility]) - POCT urinalysis dip (device) - Continue medication, monitor blood pressure at home. Continue DASH diet.  Reminder to go to the ER if any CP, SOB, nausea, dizziness, severe HA, changes vision/speech, left arm numbness and tingling and jaw pain.  4. HCC (hepatocellular carcinoma) - Pt has completed radiation Tx. He will be following with XRT on an every 6 month basis.   5. Hepatitis C: Pt has a diagnosis of Hepatitis C and I have advised him to follow up with Hepatologist for consideration of treatment particularly in light of [place]. He will call their office to discuss.   Continue diet and meds as discussed. Further disposition pending results of labs. Discussed medication effects and side effects.    Lajuanda Penick A., MD 8:53 AM [facility]

## 2015-06-14 ENCOUNTER — Encounter: Payer: Self-pay | Admitting: Vascular Surgery

## 2015-06-16 ENCOUNTER — Ambulatory Visit: Payer: Medicaid Other | Admitting: Vascular Surgery

## 2015-07-10 ENCOUNTER — Encounter: Payer: Self-pay | Admitting: Vascular Surgery

## 2015-07-14 ENCOUNTER — Ambulatory Visit: Payer: Medicaid Other | Admitting: Vascular Surgery

## 2015-08-03 ENCOUNTER — Encounter: Payer: Self-pay | Admitting: Vascular Surgery

## 2015-08-04 ENCOUNTER — Ambulatory Visit: Payer: Medicaid Other | Admitting: Vascular Surgery

## 2015-08-05 ENCOUNTER — Encounter (HOSPITAL_COMMUNITY): Payer: Self-pay

## 2015-08-05 ENCOUNTER — Emergency Department (HOSPITAL_COMMUNITY)
Admission: EM | Admit: 2015-08-05 | Discharge: 2015-08-05 | Discharge status: Against Medical Advice | Payer: Medicaid Other | Attending: Emergency Medicine | Admitting: Emergency Medicine

## 2015-08-05 DIAGNOSIS — R000 Tachycardia, unspecified: Secondary | ICD-10-CM | POA: Diagnosis not present

## 2015-08-05 DIAGNOSIS — Z8505 Personal history of malignant neoplasm of liver: Secondary | ICD-10-CM | POA: Insufficient documentation

## 2015-08-05 DIAGNOSIS — K922 Gastrointestinal hemorrhage, unspecified: Secondary | ICD-10-CM | POA: Diagnosis not present

## 2015-08-05 DIAGNOSIS — I10 Essential (primary) hypertension: Secondary | ICD-10-CM | POA: Insufficient documentation

## 2015-08-05 DIAGNOSIS — Z87891 Personal history of nicotine dependence: Secondary | ICD-10-CM | POA: Insufficient documentation

## 2015-08-05 DIAGNOSIS — F410 Panic disorder [episodic paroxysmal anxiety] without agoraphobia: Secondary | ICD-10-CM | POA: Insufficient documentation

## 2015-08-05 DIAGNOSIS — R0989 Other specified symptoms and signs involving the circulatory and respiratory systems: Secondary | ICD-10-CM | POA: Insufficient documentation

## 2015-08-05 DIAGNOSIS — R109 Unspecified abdominal pain: Secondary | ICD-10-CM | POA: Diagnosis present

## 2015-08-05 DIAGNOSIS — Z79899 Other long term (current) drug therapy: Secondary | ICD-10-CM | POA: Diagnosis not present

## 2015-08-05 DIAGNOSIS — Z7982 Long term (current) use of aspirin: Secondary | ICD-10-CM | POA: Insufficient documentation

## 2015-08-05 DIAGNOSIS — Z8619 Personal history of other infectious and parasitic diseases: Secondary | ICD-10-CM | POA: Insufficient documentation

## 2015-08-05 DIAGNOSIS — F329 Major depressive disorder, single episode, unspecified: Secondary | ICD-10-CM | POA: Insufficient documentation

## 2015-08-05 LAB — CBC WITH DIFFERENTIAL/PLATELET
Basophils Absolute: 0 10*3/uL (ref 0.0–0.1)
Basophils Relative: 0 % (ref 0–1)
Eosinophils Absolute: 0.2 10*3/uL (ref 0.0–0.7)
Eosinophils Relative: 2 % (ref 0–5)
HCT: 32.7 % — ABNORMAL LOW (ref 39.0–52.0)
Hemoglobin: 10.9 g/dL — ABNORMAL LOW (ref 13.0–17.0)
Lymphocytes Relative: 21 % (ref 12–46)
Lymphs Abs: 2.6 10*3/uL (ref 0.7–4.0)
MCH: 31.9 pg (ref 26.0–34.0)
MCHC: 33.3 g/dL (ref 30.0–36.0)
MCV: 95.6 fL (ref 78.0–100.0)
Monocytes Absolute: 1 10*3/uL (ref 0.1–1.0)
Monocytes Relative: 8 % (ref 3–12)
Neutro Abs: 8.5 10*3/uL — ABNORMAL HIGH (ref 1.7–7.7)
Neutrophils Relative %: 69 % (ref 43–77)
Platelets: 105 10*3/uL — ABNORMAL LOW (ref 150–400)
RBC: 3.42 MIL/uL — ABNORMAL LOW (ref 4.22–5.81)
RDW: 13.9 % (ref 11.5–15.5)
WBC: 12.3 10*3/uL — ABNORMAL HIGH (ref 4.0–10.5)

## 2015-08-05 LAB — LIPASE, BLOOD: Lipase: 29 U/L (ref 22–51)

## 2015-08-05 LAB — HEPATIC FUNCTION PANEL
ALT: 36 U/L (ref 17–63)
AST: 39 U/L (ref 15–41)
Albumin: 4 g/dL (ref 3.5–5.0)
Alkaline Phosphatase: 109 U/L (ref 38–126)
Bilirubin, Direct: 0.2 mg/dL (ref 0.1–0.5)
Indirect Bilirubin: 0.7 mg/dL (ref 0.3–0.9)
Total Bilirubin: 0.9 mg/dL (ref 0.3–1.2)
Total Protein: 7 g/dL (ref 6.5–8.1)

## 2015-08-05 LAB — URINALYSIS, ROUTINE W REFLEX MICROSCOPIC
Bilirubin Urine: NEGATIVE
Glucose, UA: NEGATIVE mg/dL
Hgb urine dipstick: NEGATIVE
Ketones, ur: NEGATIVE mg/dL
Nitrite: NEGATIVE
Protein, ur: NEGATIVE mg/dL
Specific Gravity, Urine: 1.01 (ref 1.005–1.030)
Urobilinogen, UA: 0.2 mg/dL (ref 0.0–1.0)
pH: 5 (ref 5.0–8.0)

## 2015-08-05 LAB — I-STAT CHEM 8, ED
BUN: 56 mg/dL — ABNORMAL HIGH (ref 6–20)
Calcium, Ion: 1.24 mmol/L (ref 1.13–1.30)
Chloride: 107 mmol/L (ref 101–111)
Creatinine, Ser: 1.3 mg/dL — ABNORMAL HIGH (ref 0.61–1.24)
Glucose, Bld: 120 mg/dL — ABNORMAL HIGH (ref 65–99)
HCT: 36 % — ABNORMAL LOW (ref 39.0–52.0)
Hemoglobin: 12.2 g/dL — ABNORMAL LOW (ref 13.0–17.0)
Potassium: 4.5 mmol/L (ref 3.5–5.1)
Sodium: 139 mmol/L (ref 135–145)
TCO2: 19 mmol/L (ref 0–100)

## 2015-08-05 LAB — URINE MICROSCOPIC-ADD ON

## 2015-08-05 LAB — PROTIME-INR
INR: 1.22 (ref 0.00–1.49)
Prothrombin Time: 15.5 seconds — ABNORMAL HIGH (ref 11.6–15.2)

## 2015-08-05 LAB — I-STAT TROPONIN, ED: Troponin i, poc: 0 ng/mL (ref 0.00–0.08)

## 2015-08-05 LAB — POC OCCULT BLOOD, ED: Fecal Occult Bld: POSITIVE — AB

## 2015-08-05 MED ORDER — SODIUM CHLORIDE 0.9 % IV BOLUS (SEPSIS)
1000.0000 mL | Freq: Once | INTRAVENOUS | Status: AC
Start: 2015-08-05 — End: 2015-08-05
  Administered 2015-08-05: 1000 mL via INTRAVENOUS

## 2015-08-05 MED ORDER — PANTOPRAZOLE SODIUM 20 MG PO TBEC: 20.0000 mg | DELAYED_RELEASE_TABLET | Freq: Two times a day (BID) | ORAL | Status: DC

## 2015-08-05 MED ORDER — PANTOPRAZOLE SODIUM 40 MG IV SOLR
40.0000 mg | Freq: Once | INTRAVENOUS | Status: AC
  Administered 2015-08-05: 40 mg via INTRAVENOUS
  Filled 2015-08-05: qty 40

## 2015-08-05 MED ORDER — MORPHINE SULFATE (PF) 4 MG/ML IV SOLN
4.0000 mg | Freq: Once | INTRAVENOUS | Status: AC
  Administered 2015-08-05: 4 mg via INTRAVENOUS
  Filled 2015-08-05: qty 1

## 2015-08-05 MED ORDER — SODIUM CHLORIDE 0.9 % IV SOLN
INTRAVENOUS | Status: DC
  Administered 2015-08-05: 13:00:00 via INTRAVENOUS

## 2015-08-05 MED ORDER — ONDANSETRON HCL 4 MG/2ML IJ SOLN
4.0000 mg | Freq: Once | INTRAMUSCULAR | Status: AC
  Administered 2015-08-05: 4 mg via INTRAVENOUS
  Filled 2015-08-05: qty 2

## 2015-08-05 NOTE — Discharge Instructions (Signed)
Please follow up closely with your GI specialist next week for further evaluation of your gastrointestinal bleeding.  Return to ER if your condition worsen or if you have other concerns.  You are leaving against medical advice.   Gastrointestinal Bleeding Gastrointestinal bleeding is bleeding somewhere along the path that food travels through the body (digestive tract). This path is anywhere between the mouth and the opening of the butt (anus). You may have blood in your throw up (vomit) or in your poop (stools). If there is a lot of bleeding, you may need to stay in the hospital. [place]  Only take medicine as told by your doctor.  Eat foods with fiber such as whole grains, fruits, and vegetables. You can also try eating 1 to 3 prunes a day.  Drink enough fluids to keep your pee (urine) clear or pale yellow. GET HELP RIGHT AWAY IF:   Your bleeding gets worse.  You feel dizzy, weak, or you pass out (faint).  You have bad cramps in your back or belly (abdomen).  You have large blood clumps (clots) in your poop.  Your problems are getting worse. MAKE SURE YOU:   Understand these instructions.  Will watch your condition.  Will get help right away if you are not doing well or get worse. Document Released: 09/10/2008 Document Revised: 11/18/2012 Document Reviewed: 11/11/2011 [facility] Patient Information 2015 [place], [place]. This information is not intended to replace advice given to you by your health care provider. Make sure you discuss any questions you have with your health care provider.

## 2015-08-05 NOTE — ED Notes (Signed)
He c/o 1 epidodes of emesis yesterday; and 1 very dark "black" stool today plus some generalized abd. Discomfort.  He states he has been chronically on o.t.c. Stool softener and has recently used Dulcolax.  He is in no distress.

## 2015-08-05 NOTE — ED Provider Notes (Signed)
CSN: [id number]     Arrival date & time 08/05/15  1044 History   First MD Initiated Contact with Patient 08/05/15 1124     Chief Complaint  Patient presents with  . Abdominal Pain     (Consider location/radiation/quality/duration/timing/severity/associated sxs/prior Treatment) HPI   59 year old male with history of alcohol abuse, hepatitis C, hepatocellular carcinoma, cirrhosis of liver presenting for evaluation of abdominal pain. Patient states since he is currently on a low-sodium diet, he eats at [employer] on a daily basis. Yesterday after eating a normal male, he developed abdominal discomfort follows with nausea and vomiting and decreased appetite. He also took one Dulcolax and report that his stools is dark which is usual whenever he takes his Dulcolax. Today he reported abdominal discomfort is mild but he is afraid to eat. He did try to stay hydrated by drinking fluid. Last bowel movement was this morning. Patient believes he may have food poisoning. He denies fever, chills, headache, chest pain, shortness of breath, productive cough, back pain, dysuria, or rash. Patient states his liver cancer is well-controlled. He denies any recent alcohol use. Denies taking NSAIDs regularly.  Past Medical History  Diagnosis Date  . Depression   . Anxiety   . Panic attack   . Hypertension   . Hepatitis C   . Claudication of left lower extremity m-1  . Abnormal MRI of abdomen, liver  04/21/2014  . Cirrhosis, alcoholic 12/24/6220  . Liver mass   . [place] (hepatocellular carcinoma)    Past Surgical History  Procedure Laterality Date  . Cosmetic surgery      facial reconstructive surgery  . Aortogram    . Abdominal aortagram N/A 12/27/2013    Procedure: ABDOMINAL Maxcine Ham;  Surgeon: Conrad [place], MD;  Location: [facility] CATH LAB;  Service: Cardiovascular;  Laterality: N/A;  . Lower extremity angiogram Bilateral 12/27/2013    Procedure: LOWER EXTREMITY ANGIOGRAM;  Surgeon: Conrad [place], MD;  Location: [facility] CATH  LAB;  Service: Cardiovascular;  Laterality: Bilateral;  . Esophagogastroduodenoscopy N/A 12/17/2014    Procedure: ESOPHAGOGASTRODUODENOSCOPY (EGD);  Surgeon: Missy Sabins, MD;  Location: [facility] ENDOSCOPY;  Service: Endoscopy;  Laterality: N/A;   Family History  Problem Relation Age of Onset  . Diabetes Father   . Diabetes Brother   . Diabetes Brother    Social History  Substance Use Topics  . Smoking status: Former Smoker -- 0.50 packs/day    Quit date: 08/21/2011  . Smokeless tobacco: Never Used  . Alcohol Use: No     Comment: Occasional beer on the weekends    Review of Systems  All other systems reviewed and are negative.     Allergies  Folic acid  Home Medications   Prior to Admission medications   Medication Sig Start Date End Date Taking? Authorizing Provider  ALPRAZolam Duanne Moron) 0.5 MG tablet Take 0.5 mg by mouth 2 (two) times daily.    Yes Historical Provider, MD  aspirin 81 MG tablet Take 81 mg by mouth daily.   Yes Historical Provider, MD  benazepril (LOTENSIN) 10 MG tablet Take 1 tablet (10 mg total) by mouth daily. 01/12/15  Yes Leana Gamer, MD  bisacodyl (DULCOLAX) 5 MG EC tablet Take 5 mg by mouth daily as needed for mild constipation or moderate constipation.   Yes Historical Provider, MD  FLUoxetine (PROZAC) 40 MG capsule Take 1 capsule by mouth every morning. 05/02/15  Yes Historical Provider, MD  furosemide (LASIX) 20 MG tablet Take 20 mg by mouth daily.  Yes Historical Provider, MD  oxycodone (OXY-IR) 5 MG capsule Take 1 capsule (5 mg total) by mouth every 4 (four) hours as needed. Patient taking differently: Take 5 mg by mouth every 4 (four) hours as needed for pain.  06/02/15  Yes Dorena Dew, FNP  spironolactone (ALDACTONE) 25 MG tablet Take 75 mg by mouth every morning.    Yes Historical Provider, MD  zolpidem (AMBIEN) 10 MG tablet Take 10 mg by mouth at bedtime.    Yes Historical Provider, MD  docusate sodium 100 MG CAPS Take 100 mg by mouth 2  (two) times daily. Patient not taking: Reported on 08/05/2015 06/11/14   Ascencion Dike, PA-C  ergocalciferol (DRISDOL) 50000 UNITS capsule Take 1 capsule (50,000 Units total) by mouth once a week. Patient not taking: Reported on 08/05/2015 03/31/14   Dorena Dew, FNP  folic acid (FOLVITE) 1 MG tablet Take 1 tablet (1 mg total) by mouth daily. Patient not taking: Reported on 08/05/2015 07/06/14   Leana Gamer, MD  potassium chloride SA (K-DUR,KLOR-CON) 20 MEQ tablet Take 2 tablets (40 mEq total) by mouth daily. For 5 days Patient not taking: Reported on 08/05/2015 12/17/14   Eugenie Filler, MD  thiamine 100 MG tablet Take 1 tablet (100 mg total) by mouth daily. Patient not taking: Reported on 08/05/2015 05/02/14   Modena Jansky, MD   BP 92/53 mmHg  Pulse 110  Temp(Src) 97.6 F (36.4 C) (Oral)  Resp 18  SpO2 100% Physical Exam  Constitutional: He appears well-developed and well-nourished. No distress.  HENT:  Head: Atraumatic.  Eyes: Conjunctivae are normal.  Neck: Neck supple.  Cardiovascular:  Tachycardia without murmurs rubs or gallops  Pulmonary/Chest:  Faint scattered rhonchi heard  Abdominal: Soft. Bowel sounds are normal. He exhibits no distension. There is tenderness (Mild diffuse abdominal discomfort on palpation without focal point tenderness. Negative Murphy sign, no pain at McBurney's point.). There is no rebound and no guarding.  Genitourinary:  Chaperone present during exam. Normal rectal tone, no mass, black tarry stool noted.  Neurological: He is alert.  Skin: No rash noted.  Psychiatric: He has a normal mood and affect.  Nursing note and vitals reviewed.   ED Course  Procedures (including critical care time)  Patient with abdominal discomfort with nausea vomiting after eating at [employer]. Likely food poisoning, however he has history of liver cancer in remission and he is tachycardic and mildly hypotensive therefore IV fluid, labs, an abdominal CT scan will be  obtained. Doubt biliary disease.  12:30 PM Patient with evidence of upper GI bleed as demonstrated by black and tarry Hemoccult-positive stools, and abdominal discomfort. Hemoglobin is 10.9. BUN 56 creatinine 1.3. Patient has one similar episode in January . He was evaluated by GI specialist, [name] who performed an EGD which show no esophageal varices on EGD no active bleeding but friable mucosa throughout the body and antrum as well as in the duodenum with one small duodenal erosion with no stigmata of hemorrhage.    12:35 PM Normal hepatic function panel. Urine shows 11-20 WBC and few bacteria. Nitrite negative. He denies having any urinary discomfort, therefore urine culture sent. Will not treat for UTI at this time as he has no symptoms  12:51 PM Appreciate consultation from GI specialist [name], who recommend if pt to be admitted then he will be available for consultation and if pt gets discharge, then pt to f/u outpt with [name] for further care.  Recommend PPI. No  octreotide indicated.  Care discussed with Dr. Regenia Skeeter.   2:11 PM Recommend pt to be admitted for further evaluation of his upper GI bleed.  However, pt is adamant on leaving.  He understand risk of leaving AMA, including worsening bleeding or potential death.  He agrees to f/u closely with his GI specialist next week.  Will prescribe protonix.    Labs Review Labs Reviewed  CBC WITH DIFFERENTIAL/PLATELET - Abnormal; Notable for the following:    WBC 12.3 (*)    RBC 3.42 (*)    Hemoglobin 10.9 (*)    HCT 32.7 (*)    Platelets 105 (*)    Neutro Abs 8.5 (*)    All other components within normal limits  URINALYSIS, ROUTINE W REFLEX MICROSCOPIC (NOT AT [facility]) - Abnormal; Notable for the following:    APPearance CLOUDY (*)    Leukocytes, UA SMALL (*)    All other components within normal limits  PROTIME-INR - Abnormal; Notable for the following:    Prothrombin Time 15.5 (*)    All other components within normal  limits  URINE MICROSCOPIC-ADD ON - Abnormal; Notable for the following:    Squamous Epithelial / LPF FEW (*)    Bacteria, UA FEW (*)    Casts HYALINE CASTS (*)    All other components within normal limits  I-STAT CHEM 8, ED - Abnormal; Notable for the following:    BUN 56 (*)    Creatinine, Ser 1.30 (*)    Glucose, Bld 120 (*)    Hemoglobin 12.2 (*)    HCT 36.0 (*)    All other components within normal limits  POC OCCULT BLOOD, ED - Abnormal; Notable for the following:    Fecal Occult Bld POSITIVE (*)    All other components within normal limits  URINE CULTURE  HEPATIC FUNCTION PANEL  LIPASE, BLOOD  I-STAT TROPOININ, ED    Imaging Review No results found. I have personally reviewed and evaluated lab results as part of my medical decision-making.   EKG Interpretation None      MDM   Final diagnoses:  Upper GI bleed    BP 93/35 mmHg  Pulse 66  Temp(Src) 97.6 F (36.4 C) (Oral)  Resp 16  Ht 5\' 8"  (1.727 m)  Wt 150 lb (68.04 kg)  BMI 22.81 kg/m2  SpO2 98%  I have reviewed nursing notes and vital signs. I reviewed available ER/hospitalization records through the EMR     Domenic Moras, PA-C 08/05/15 [place], MD 08/06/15 1009

## 2015-08-07 LAB — URINE CULTURE

## 2015-08-11 ENCOUNTER — Other Ambulatory Visit: Payer: Self-pay | Admitting: Family Medicine

## 2015-08-25 ENCOUNTER — Other Ambulatory Visit: Payer: Self-pay

## 2015-08-31 ENCOUNTER — Ambulatory Visit (INDEPENDENT_AMBULATORY_CARE_PROVIDER_SITE_OTHER): Payer: Medicaid Other | Admitting: Family Medicine

## 2015-08-31 ENCOUNTER — Encounter: Payer: Self-pay | Admitting: Family Medicine

## 2015-08-31 DIAGNOSIS — N390 Urinary tract infection, site not specified: Secondary | ICD-10-CM

## 2015-08-31 DIAGNOSIS — M79671 Pain in right foot: Secondary | ICD-10-CM

## 2015-08-31 DIAGNOSIS — I10 Essential (primary) hypertension: Secondary | ICD-10-CM

## 2015-08-31 DIAGNOSIS — I739 Peripheral vascular disease, unspecified: Secondary | ICD-10-CM | POA: Diagnosis not present

## 2015-08-31 DIAGNOSIS — B182 Chronic viral hepatitis C: Secondary | ICD-10-CM

## 2015-08-31 LAB — POCT URINALYSIS DIP (DEVICE)
Bilirubin Urine: NEGATIVE
Glucose, UA: NEGATIVE mg/dL
Hgb urine dipstick: NEGATIVE
Ketones, ur: NEGATIVE mg/dL
Nitrite: POSITIVE — AB
Protein, ur: NEGATIVE mg/dL
Specific Gravity, Urine: 1.01 (ref 1.005–1.030)
Urobilinogen, UA: 0.2 mg/dL (ref 0.0–1.0)
pH: 6 (ref 5.0–8.0)

## 2015-08-31 LAB — COMPLETE METABOLIC PANEL WITH GFR
ALT: 20 U/L (ref 9–46)
AST: 25 U/L (ref 10–35)
Albumin: 3.7 g/dL (ref 3.6–5.1)
Alkaline Phosphatase: 108 U/L (ref 40–115)
BUN: 11 mg/dL (ref 7–25)
CO2: 23 mmol/L (ref 20–31)
Calcium: 8.7 mg/dL (ref 8.6–10.3)
Chloride: 104 mmol/L (ref 98–110)
Creat: 0.96 mg/dL (ref 0.70–1.25)
GFR, Est African American: >89 mL/min (ref >=60)
GFR, Est Non African American: 86 mL/min (ref >=60)
Glucose, Bld: 74 mg/dL (ref 65–99)
Potassium: 4.2 mmol/L (ref 3.5–5.3)
Sodium: 137 mmol/L (ref 135–146)
Total Bilirubin: 0.6 mg/dL (ref 0.2–1.2)
Total Protein: 6.3 g/dL (ref 6.1–8.1)

## 2015-08-31 MED ORDER — CIPROFLOXACIN HCL 500 MG PO TABS: 500.0000 mg | ORAL_TABLET | Freq: Two times a day (BID) | ORAL | Status: AC

## 2015-08-31 NOTE — Patient Instructions (Signed)

## 2015-08-31 NOTE — Progress Notes (Signed)
Subjective:    Patient ID: Jeffrey Snow, male    DOB: [birth year], 59 y.o.   MRN: [id number]  HPI [name], a 59 year old male with a history of hypertension and hepatitis C presents for a 3 month follow up of hypertension. He is not exercising and is not adherent to low salt diet.  Blood pressure is well controlled at home. Patient has a history of claudication of left lower extremity . Patient denies chest pain, chest pressure/discomfort, exertional chest pressure/discomfort, irregular heart beat, lower extremity edema, palpitations and tachypnea.  Cardiovascular risk factors include: advanced age (older than 60 for men, 55 for women), dyslipidemia and sedentary lifestyle. Jeffrey Snow states that he is consistently taking medications.   Past Medical History  Diagnosis Date  . Depression   . Anxiety   . Panic attack   . Hypertension   . Hepatitis C   . Claudication of left lower extremity m-1  . Abnormal MRI of abdomen, liver  04/21/2014  . Cirrhosis, alcoholic 02/20/9023  . Liver mass   . [place] (hepatocellular carcinoma)    Social History   Social History  . Marital Status: Single    Spouse Name: N/A  . Number of Children: 3  . Years of Education: N/A   Occupational History  .  [employer]   Social History Main Topics  . Smoking status: Former Smoker -- 0.50 packs/day    Quit date: 08/21/2011  . Smokeless tobacco: Never Used  . Alcohol Use: No     Comment: Occasional beer on the weekends  . Drug Use: No     Comment: abused cocaine in the 80's  . Sexual Activity: Not on file   Other Topics Concern  . Not on file   Social History Narrative   Single, never married.  Lives alone.  3 daughters, 16 grandchildren who are not very involved in the patient's care.  Quit smoking and heavy drinking 3.5 years ago.    Allergies  Allergen Reactions  . Folic Acid Other (See Comments)    Makes stomach feel funny   Immunization History  Administered Date(s)  Administered  . Influenza,inj,Quad PF,36+ Mos 12/20/2013, 12/21/2014  . Tdap 01/27/2014   Review of Systems  Constitutional: Positive for fatigue.  HENT: Negative.   Eyes: Negative.  Negative for visual disturbance.  Respiratory: Negative.   Cardiovascular: Negative.  Negative for chest pain, palpitations and leg swelling.  Gastrointestinal: Negative for vomiting and diarrhea.  Endocrine: Negative.  Negative for polydipsia and polyphagia.  Genitourinary: Negative.  Negative for frequency, hematuria and decreased urine volume.  Musculoskeletal: Negative.        Right foot pain  Skin: Negative.   Allergic/Immunologic: Positive for immunocompromised state (Hepatitis C with hepatocellular carcinoma).  Neurological: Negative.  Negative for dizziness and weakness.  Hematological: Negative.   Psychiatric/Behavioral: Positive for agitation. Negative for suicidal ideas and sleep disturbance. The patient is nervous/anxious.        Objective:   Physical Exam  Constitutional: He is oriented to person, place, and time. He appears well-developed and well-nourished.  HENT:  Head: Normocephalic and atraumatic.  Right Ear: External ear normal.  Left Ear: External ear normal.  Mouth/Throat: Oropharynx is clear and moist.  Eyes: Conjunctivae are normal. Pupils are equal, round, and reactive to light.  Neck: Normal range of motion. Neck supple.  Cardiovascular: Normal rate, regular rhythm, normal heart sounds and intact distal pulses.   Pulmonary/Chest: Effort normal.  Abdominal: Soft. Bowel sounds  are normal.  Musculoskeletal:       Right ankle: He exhibits normal range of motion, no swelling and normal pulse. Tenderness.  Neurological: He is alert and oriented to person, place, and time. He has normal reflexes.  Skin: Skin is dry and intact. Rash noted. Rash is maculopapular. He is not diaphoretic. No cyanosis. Nails show no clubbing.  Psychiatric: His speech is normal. Judgment normal. His  mood appears anxious. He expresses no homicidal and no suicidal ideation.        BP 122/68 mmHg  Pulse 58  Temp(Src) 98 F (36.7 C) (Oral)  Resp 16  Ht 5' 8.5" (1.74 m)  Wt 152 lb (68.947 kg)  BMI 22.77 kg/m2 Assessment & Plan:   1. Essential hypertension Blood pressure at goal on current medication regimen. Will continue current medication regimen. Reviewed urine for proteinuria, negative. The patient is asked to make an attempt to improve diet and exercise patterns to aid in medical management of this problem.Continue medication, monitor blood pressure at home. Continue DASH diet. Reminder to go to the ER if any CP, SOB, nausea, dizziness, severe HA, changes vision/speech, left arm numbness and tingling and jaw pain.   - Urinalysis Dipstick - COMPLETE METABOLIC PANEL WITH GFR  2. Urinary tract infection, acute Positive nitrites and small leukocytes present. Will start antibiotic and send for urine culture.  - ciprofloxacin (CIPRO) 500 MG tablet; Take 1 tablet (500 mg total) by mouth 2 (two) times daily.  Dispense: 14 tablet; Refill: 0 - Urine culture  3. Chronic hepatitis C without hepatic coma Pt has a diagnosis of Hepatitis C and I have advised him to follow up with Hepatologist for consideration of treatment particularly in light of [place]. Patient states that he has an appointment scheduled with hepatologist in December.   4. Right foot pain Pt has continued pain in RLE. He has been evaluated by Vascular and vein specialists consistently. . Pt brought  Oxycodone IR 5 mg pills to appointment, patient has used 16/30 pills in 3 months. Discussed medication regimen with patient at length.   RTC: 3 months for hypertension  Dorena Dew, FNP

## 2015-09-03 LAB — URINE CULTURE: Colony Count: >=100000

## 2015-09-05 ENCOUNTER — Encounter (HOSPITAL_COMMUNITY)
Admission: EM | Disposition: A | Discharge status: Home / Self Care | Payer: Self-pay | Source: Home / Self Care | Attending: Internal Medicine

## 2015-09-05 ENCOUNTER — Encounter (HOSPITAL_COMMUNITY): Payer: Self-pay | Admitting: Oncology

## 2015-09-05 ENCOUNTER — Telehealth: Payer: Self-pay | Admitting: Internal Medicine

## 2015-09-05 ENCOUNTER — Inpatient Hospital Stay (HOSPITAL_COMMUNITY)
Admission: EM | Admit: 2015-09-05 | Discharge: 2015-09-09 | DRG: 377 | Disposition: A | Discharge status: Home / Self Care | Payer: Medicaid Other | Attending: Internal Medicine | Admitting: Internal Medicine

## 2015-09-05 DIAGNOSIS — D62 Acute posthemorrhagic anemia: Secondary | ICD-10-CM | POA: Diagnosis present

## 2015-09-05 DIAGNOSIS — E861 Hypovolemia: Secondary | ICD-10-CM | POA: Diagnosis present

## 2015-09-05 DIAGNOSIS — Z87891 Personal history of nicotine dependence: Secondary | ICD-10-CM | POA: Diagnosis not present

## 2015-09-05 DIAGNOSIS — C220 Liver cell carcinoma: Secondary | ICD-10-CM

## 2015-09-05 DIAGNOSIS — N390 Urinary tract infection, site not specified: Secondary | ICD-10-CM | POA: Diagnosis present

## 2015-09-05 DIAGNOSIS — K7030 Alcoholic cirrhosis of liver without ascites: Secondary | ICD-10-CM | POA: Diagnosis present

## 2015-09-05 DIAGNOSIS — R7989 Other specified abnormal findings of blood chemistry: Secondary | ICD-10-CM | POA: Diagnosis present

## 2015-09-05 DIAGNOSIS — B1920 Unspecified viral hepatitis C without hepatic coma: Secondary | ICD-10-CM | POA: Diagnosis present

## 2015-09-05 DIAGNOSIS — K250 Acute gastric ulcer with hemorrhage: Secondary | ICD-10-CM | POA: Diagnosis present

## 2015-09-05 DIAGNOSIS — R1011 Right upper quadrant pain: Secondary | ICD-10-CM

## 2015-09-05 DIAGNOSIS — F410 Panic disorder [episodic paroxysmal anxiety] without agoraphobia: Secondary | ICD-10-CM | POA: Diagnosis present

## 2015-09-05 DIAGNOSIS — D696 Thrombocytopenia, unspecified: Secondary | ICD-10-CM | POA: Diagnosis present

## 2015-09-05 DIAGNOSIS — E872 Acidosis, unspecified: Secondary | ICD-10-CM | POA: Diagnosis present

## 2015-09-05 DIAGNOSIS — D500 Iron deficiency anemia secondary to blood loss (chronic): Secondary | ICD-10-CM

## 2015-09-05 DIAGNOSIS — I10 Essential (primary) hypertension: Secondary | ICD-10-CM | POA: Diagnosis present

## 2015-09-05 DIAGNOSIS — K922 Gastrointestinal hemorrhage, unspecified: Secondary | ICD-10-CM | POA: Diagnosis present

## 2015-09-05 DIAGNOSIS — Z8505 Personal history of malignant neoplasm of liver: Secondary | ICD-10-CM | POA: Diagnosis not present

## 2015-09-05 DIAGNOSIS — N179 Acute kidney failure, unspecified: Secondary | ICD-10-CM | POA: Diagnosis present

## 2015-09-05 DIAGNOSIS — Z833 Family history of diabetes mellitus: Secondary | ICD-10-CM

## 2015-09-05 DIAGNOSIS — K920 Hematemesis: Secondary | ICD-10-CM | POA: Diagnosis present

## 2015-09-05 DIAGNOSIS — R579 Shock, unspecified: Secondary | ICD-10-CM | POA: Diagnosis not present

## 2015-09-05 DIAGNOSIS — R578 Other shock: Secondary | ICD-10-CM | POA: Diagnosis present

## 2015-09-05 DIAGNOSIS — K449 Diaphragmatic hernia without obstruction or gangrene: Secondary | ICD-10-CM | POA: Diagnosis present

## 2015-09-05 DIAGNOSIS — E875 Hyperkalemia: Secondary | ICD-10-CM | POA: Diagnosis present

## 2015-09-05 DIAGNOSIS — R945 Abnormal results of liver function studies: Secondary | ICD-10-CM

## 2015-09-05 DIAGNOSIS — G8929 Other chronic pain: Secondary | ICD-10-CM

## 2015-09-05 HISTORY — PX: ESOPHAGOGASTRODUODENOSCOPY: SHX5428

## 2015-09-05 LAB — CBC WITH DIFFERENTIAL/PLATELET
Basophils Absolute: 0 10*3/uL (ref 0.0–0.1)
Basophils Relative: 0 %
Eosinophils Absolute: 0 10*3/uL (ref 0.0–0.7)
Eosinophils Relative: 0 %
HCT: 22.9 % — ABNORMAL LOW (ref 39.0–52.0)
Hemoglobin: 7.7 g/dL — ABNORMAL LOW (ref 13.0–17.0)
Lymphocytes Relative: 12 %
Lymphs Abs: 1.2 10*3/uL (ref 0.7–4.0)
MCH: 33.2 pg (ref 26.0–34.0)
MCHC: 33.6 g/dL (ref 30.0–36.0)
MCV: 98.7 fL (ref 78.0–100.0)
Monocytes Absolute: 0.6 10*3/uL (ref 0.1–1.0)
Monocytes Relative: 6 %
Neutro Abs: 8.4 10*3/uL — ABNORMAL HIGH (ref 1.7–7.7)
Neutrophils Relative %: 82 %
Platelets: 106 10*3/uL — ABNORMAL LOW (ref 150–400)
RBC: 2.32 MIL/uL — ABNORMAL LOW (ref 4.22–5.81)
RDW: 14 % (ref 11.5–15.5)
WBC: 10.2 10*3/uL (ref 4.0–10.5)

## 2015-09-05 LAB — HEPATIC FUNCTION PANEL
ALT: 26 U/L (ref 17–63)
AST: 47 U/L — ABNORMAL HIGH (ref 15–41)
Albumin: 2.7 g/dL — ABNORMAL LOW (ref 3.5–5.0)
Alkaline Phosphatase: 86 U/L (ref 38–126)
Bilirubin, Direct: 0.2 mg/dL (ref 0.1–0.5)
Indirect Bilirubin: 0.8 mg/dL (ref 0.3–0.9)
Total Bilirubin: 1 mg/dL (ref 0.3–1.2)
Total Protein: 5.1 g/dL — ABNORMAL LOW (ref 6.5–8.1)

## 2015-09-05 LAB — BASIC METABOLIC PANEL
Anion gap: 12 (ref 5–15)
BUN: 50 mg/dL — ABNORMAL HIGH (ref 6–20)
CO2: 18 mmol/L — ABNORMAL LOW (ref 22–32)
Calcium: 8.5 mg/dL — ABNORMAL LOW (ref 8.9–10.3)
Chloride: 105 mmol/L (ref 101–111)
Creatinine, Ser: 1.31 mg/dL — ABNORMAL HIGH (ref 0.61–1.24)
GFR calc Af Amer: >60 mL/min (ref >60)
GFR calc non Af Amer: 58 mL/min — ABNORMAL LOW (ref >60)
Glucose, Bld: 163 mg/dL — ABNORMAL HIGH (ref 65–99)
Potassium: 6.2 mmol/L (ref 3.5–5.1)
Sodium: 135 mmol/L (ref 135–145)

## 2015-09-05 LAB — I-STAT CHEM 8, ED
BUN: 45 mg/dL — ABNORMAL HIGH (ref 6–20)
Calcium, Ion: 1.18 mmol/L (ref 1.13–1.30)
Chloride: 111 mmol/L (ref 101–111)
Creatinine, Ser: 1.2 mg/dL (ref 0.61–1.24)
Glucose, Bld: 127 mg/dL — ABNORMAL HIGH (ref 65–99)
HCT: 18 % — ABNORMAL LOW (ref 39.0–52.0)
Hemoglobin: 6.1 g/dL — CL (ref 13.0–17.0)
Potassium: 5.7 mmol/L — ABNORMAL HIGH (ref 3.5–5.1)
Sodium: 140 mmol/L (ref 135–145)
TCO2: 18 mmol/L (ref 0–100)

## 2015-09-05 LAB — I-STAT CG4 LACTIC ACID, ED
Lactic Acid, Venous: 4.42 mmol/L (ref 0.5–2.0)
Lactic Acid, Venous: 3.06 mmol/L (ref 0.5–2.0)
Lactic Acid, Venous: 2.63 mmol/L (ref 0.5–2.0)

## 2015-09-05 LAB — PROTIME-INR
INR: 1.55 — ABNORMAL HIGH (ref 0.00–1.49)
Prothrombin Time: 18.7 seconds — ABNORMAL HIGH (ref 11.6–15.2)

## 2015-09-05 LAB — PREPARE RBC (CROSSMATCH)

## 2015-09-05 LAB — POC OCCULT BLOOD, ED: Fecal Occult Bld: NEGATIVE

## 2015-09-05 LAB — MRSA PCR SCREENING: MRSA by PCR: NEGATIVE

## 2015-09-05 LAB — AMMONIA: Ammonia: 106 umol/L — ABNORMAL HIGH (ref 9–35)

## 2015-09-05 SURGERY — EGD (ESOPHAGOGASTRODUODENOSCOPY)
Anesthesia: Moderate Sedation

## 2015-09-05 MED ORDER — DIPHENHYDRAMINE HCL 50 MG/ML IJ SOLN
INTRAMUSCULAR | Status: DC | PRN
  Administered 2015-09-05: 25 mg via INTRAVENOUS

## 2015-09-05 MED ORDER — CIPROFLOXACIN HCL 500 MG PO TABS
500.0000 mg | ORAL_TABLET | Freq: Two times a day (BID) | ORAL | Status: DC
  Administered 2015-09-05 – 2015-09-09 (×8): 500 mg via ORAL
  Filled 2015-09-05 (×8): qty 1

## 2015-09-05 MED ORDER — SODIUM CHLORIDE 0.9 % IV SOLN
80.0000 mg | Freq: Once | INTRAVENOUS | Status: AC
  Administered 2015-09-05: 80 mg via INTRAVENOUS
  Filled 2015-09-05: qty 80

## 2015-09-05 MED ORDER — FOLIC ACID 1 MG PO TABS
1.0000 mg | ORAL_TABLET | Freq: Every day | ORAL | Status: DC
  Administered 2015-09-05 – 2015-09-09 (×4): 1 mg via ORAL
  Filled 2015-09-05 (×5): qty 1

## 2015-09-05 MED ORDER — SODIUM CHLORIDE 0.9 % IV SOLN: INTRAVENOUS | Status: DC

## 2015-09-05 MED ORDER — ONDANSETRON HCL 4 MG PO TABS: 4.0000 mg | ORAL_TABLET | Freq: Four times a day (QID) | ORAL | Status: DC | PRN

## 2015-09-05 MED ORDER — OXYCODONE HCL 5 MG PO CAPS: 5.0000 mg | ORAL_CAPSULE | ORAL | Status: DC | PRN

## 2015-09-05 MED ORDER — SODIUM CHLORIDE 0.9 % IV SOLN
10.0000 mL/h | Freq: Once | INTRAVENOUS | Status: AC
  Administered 2015-09-05: 10 mL/h via INTRAVENOUS

## 2015-09-05 MED ORDER — BUTAMBEN-TETRACAINE-BENZOCAINE 2-2-14 % EX AERO
INHALATION_SPRAY | CUTANEOUS | Status: DC | PRN
  Administered 2015-09-05: 2 via TOPICAL

## 2015-09-05 MED ORDER — FENTANYL CITRATE (PF) 100 MCG/2ML IJ SOLN
INTRAMUSCULAR | Status: AC
  Filled 2015-09-05: qty 2

## 2015-09-05 MED ORDER — FLUOXETINE HCL 20 MG PO CAPS
40.0000 mg | ORAL_CAPSULE | Freq: Every morning | ORAL | Status: DC
  Administered 2015-09-05 – 2015-09-09 (×5): 40 mg via ORAL
  Filled 2015-09-05 (×5): qty 2

## 2015-09-05 MED ORDER — SODIUM CHLORIDE 0.9 % IV SOLN
1000.0000 mL | Freq: Once | INTRAVENOUS | Status: AC
  Administered 2015-09-05: 1000 mL via INTRAVENOUS

## 2015-09-05 MED ORDER — MIDAZOLAM HCL 10 MG/2ML IJ SOLN
INTRAMUSCULAR | Status: DC | PRN
  Administered 2015-09-05 (×2): 2 mg via INTRAVENOUS
  Administered 2015-09-05: 1 mg via INTRAVENOUS

## 2015-09-05 MED ORDER — FENTANYL CITRATE (PF) 100 MCG/2ML IJ SOLN
INTRAMUSCULAR | Status: DC | PRN
  Administered 2015-09-05 (×3): 25 ug via INTRAVENOUS

## 2015-09-05 MED ORDER — LORAZEPAM 2 MG/ML IJ SOLN: 0.0000 mg | Freq: Four times a day (QID) | INTRAMUSCULAR | Status: AC

## 2015-09-05 MED ORDER — ONDANSETRON HCL 4 MG/2ML IJ SOLN
4.0000 mg | Freq: Once | INTRAMUSCULAR | Status: AC
Start: 2015-09-05 — End: 2015-09-05
  Administered 2015-09-05: 4 mg via INTRAVENOUS
  Filled 2015-09-05: qty 2

## 2015-09-05 MED ORDER — SODIUM CHLORIDE 0.9 % IV BOLUS (SEPSIS)
1000.0000 mL | Freq: Once | INTRAVENOUS | Status: AC
  Administered 2015-09-05: 1000 mL via INTRAVENOUS

## 2015-09-05 MED ORDER — LORAZEPAM 2 MG/ML IJ SOLN: 0.0000 mg | Freq: Two times a day (BID) | INTRAMUSCULAR | Status: DC

## 2015-09-05 MED ORDER — PANTOPRAZOLE SODIUM 40 MG IV SOLR
40.0000 mg | Freq: Two times a day (BID) | INTRAVENOUS | Status: DC
  Administered 2015-09-05 – 2015-09-06 (×3): 40 mg via INTRAVENOUS
  Filled 2015-09-05 (×3): qty 40

## 2015-09-05 MED ORDER — ZOLPIDEM TARTRATE 10 MG PO TABS
10.0000 mg | ORAL_TABLET | Freq: Every evening | ORAL | Status: DC | PRN
  Administered 2015-09-06 – 2015-09-08 (×3): 10 mg via ORAL
  Filled 2015-09-05 (×3): qty 1

## 2015-09-05 MED ORDER — SODIUM CHLORIDE 0.9 % IV SOLN
1.0000 g | Freq: Once | INTRAVENOUS | Status: AC
  Administered 2015-09-05: 1 g via INTRAVENOUS
  Filled 2015-09-05: qty 10

## 2015-09-05 MED ORDER — DIPHENHYDRAMINE HCL 50 MG/ML IJ SOLN
INTRAMUSCULAR | Status: AC
  Filled 2015-09-05: qty 1

## 2015-09-05 MED ORDER — THIAMINE HCL 100 MG/ML IJ SOLN: 100.0000 mg | Freq: Every day | INTRAMUSCULAR | Status: DC

## 2015-09-05 MED ORDER — ONDANSETRON HCL 4 MG/2ML IJ SOLN
4.0000 mg | Freq: Four times a day (QID) | INTRAMUSCULAR | Status: DC | PRN
  Administered 2015-09-06 – 2015-09-07 (×3): 4 mg via INTRAVENOUS
  Filled 2015-09-05 (×3): qty 2

## 2015-09-05 MED ORDER — VITAMIN B-1 100 MG PO TABS
100.0000 mg | ORAL_TABLET | Freq: Every day | ORAL | Status: DC
  Administered 2015-09-05 – 2015-09-09 (×5): 100 mg via ORAL
  Filled 2015-09-05 (×5): qty 1

## 2015-09-05 MED ORDER — MORPHINE SULFATE (PF) 4 MG/ML IV SOLN: 4.0000 mg | INTRAVENOUS | Status: DC | PRN

## 2015-09-05 MED ORDER — MIDAZOLAM HCL 5 MG/ML IJ SOLN
INTRAMUSCULAR | Status: AC
Start: 2015-09-05 — End: 2015-09-05
  Filled 2015-09-05: qty 2

## 2015-09-05 MED ORDER — SODIUM CHLORIDE 0.9 % IJ SOLN
3.0000 mL | Freq: Two times a day (BID) | INTRAMUSCULAR | Status: DC
  Administered 2015-09-05 – 2015-09-09 (×8): 3 mL via INTRAVENOUS

## 2015-09-05 MED ORDER — ALPRAZOLAM 0.5 MG PO TABS
0.5000 mg | ORAL_TABLET | Freq: Two times a day (BID) | ORAL | Status: DC
  Administered 2015-09-05 – 2015-09-09 (×7): 0.5 mg via ORAL
  Filled 2015-09-05 (×7): qty 1

## 2015-09-05 MED ORDER — LORAZEPAM 2 MG/ML IJ SOLN: 1.0000 mg | Freq: Four times a day (QID) | INTRAMUSCULAR | Status: AC | PRN

## 2015-09-05 MED ORDER — OXYCODONE HCL 5 MG PO TABS: 5.0000 mg | ORAL_TABLET | ORAL | Status: DC | PRN

## 2015-09-05 MED ORDER — LORAZEPAM 1 MG PO TABS: 1.0000 mg | ORAL_TABLET | Freq: Four times a day (QID) | ORAL | Status: AC | PRN

## 2015-09-05 MED ORDER — ADULT MULTIVITAMIN W/MINERALS CH
1.0000 | ORAL_TABLET | Freq: Every day | ORAL | Status: DC
  Administered 2015-09-05 – 2015-09-09 (×5): 1 via ORAL
  Filled 2015-09-05 (×6): qty 1

## 2015-09-05 NOTE — Telephone Encounter (Signed)
Refill request for Oxycodone 5mg . LOV 08/31/2015. Patient called in on Friday. Thanks!

## 2015-09-05 NOTE — ED Notes (Signed)
Bed: WA25 Expected date:  Expected time:  Means of arrival:  Comments: 

## 2015-09-05 NOTE — ED Notes (Signed)
Called to give report and was told to call [facility], they don't have anyone that can take a patient at this time.

## 2015-09-05 NOTE — Consult Note (Signed)
Subjective:   HPI  The patient is a 59 year old male with a history of cirrhosis of the liver and hepatitis C who has also had hepatocellular carcinoma. The hepatocellular carcinoma was treated in June 2015 by transarterial chemotherapy and percutaneous thermal ablation. According to him he is cancer free. He has never been actually treated for hepatitis C.  He presents to the hospital emergency room with complaints of vomiting blood. He started vomiting blood which she described as red with clots at about 10 PM last night. His last emesis was about 6 or 6:30 this morning. He feels better now. He had an EGD in January of this year by [name] which showed a gastritis and duodenal erosion that no varices  Review of Systems Currently denies chest pain or shortness of breath.  Past Medical History  Diagnosis Date  . Depression   . Anxiety   . Panic attack   . Hypertension   . Hepatitis C   . Claudication of left lower extremity m-1  . Abnormal MRI of abdomen, liver  04/21/2014  . Cirrhosis, alcoholic 12/21/9676  . Liver mass   . [place] (hepatocellular carcinoma)    Past Surgical History  Procedure Laterality Date  . Cosmetic surgery      facial reconstructive surgery  . Aortogram    . Abdominal aortagram N/A 12/27/2013    Procedure: ABDOMINAL Maxcine Ham;  Surgeon: Conrad [place], MD;  Location: [facility] CATH LAB;  Service: Cardiovascular;  Laterality: N/A;  . Lower extremity angiogram Bilateral 12/27/2013    Procedure: LOWER EXTREMITY ANGIOGRAM;  Surgeon: Conrad Glade, MD;  Location: [facility] CATH LAB;  Service: Cardiovascular;  Laterality: Bilateral;  . Esophagogastroduodenoscopy N/A 12/17/2014    Procedure: ESOPHAGOGASTRODUODENOSCOPY (EGD);  Surgeon: Missy Sabins, MD;  Location: [facility] END[facility];  Service: Endoscopy;  Laterality: N/A;   Social History   Social History  . Marital Status: Single    Spouse Name: N/A  . Number of Children: 3  . Years of Education: N/A   Occupational History  .  [employer]   Social History Main Topics  . Smoking status: Former Smoker -- 0.50 packs/day    Quit date: 08/21/2011  . Smokeless tobacco: Never Used  . Alcohol Use: No     Comment: Occasional beer on the weekends  . Drug Use: No     Comment: abused cocaine in the 80's  . Sexual Activity: Not on file   Other Topics Concern  . Not on file   Social History Narrative   Single, never married.  Lives alone.  3 daughters, 16 grandchildren who are not very involved in the patient's care.  Quit smoking and heavy drinking 3.5 years ago.    family history includes Diabetes in his brother, brother, and father. No current facility-administered medications for this encounter.  Current outpatient prescriptions:  .  ALPRAZolam (XANAX) 0.5 MG tablet, Take 0.5 mg by mouth 2 (two) times daily. , Disp: , Rfl:  .  aspirin 81 MG tablet, Take 81 mg by mouth daily., Disp: , Rfl:  .  benazepril (LOTENSIN) 10 MG tablet, Take 1 tablet (10 mg total) by mouth daily., Disp: 90 tablet, Rfl: 3 .  bisacodyl (DULCOLAX) 5 MG EC tablet, Take 5 mg by mouth daily as needed for mild constipation or moderate constipation., Disp: , Rfl:  .  ciprofloxacin (CIPRO) 500 MG tablet, Take 1 tablet (500 mg total) by mouth 2 (two) times daily., Disp: 14 tablet, Rfl: 0 .  FLUoxetine (PROZAC)  40 MG capsule, Take 1 capsule by mouth every morning., Disp: , Rfl: 2 .  furosemide (LASIX) 20 MG tablet, Take 20 mg by mouth daily., Disp: , Rfl:  .  oxycodone (OXY-IR) 5 MG capsule, Take 1 capsule (5 mg total) by mouth every 4 (four) hours as needed. (Patient taking differently: Take 5 mg by mouth every 4 (four) hours as needed for pain. ), Disp: 30 capsule, Rfl: 0 .  spironolactone (ALDACTONE) 25 MG tablet, Take 75 mg by mouth every morning. , Disp: , Rfl:  .  zolpidem (AMBIEN) 10 MG tablet, Take 10 mg by mouth at bedtime as needed for sleep. , Disp: , Rfl:  .  docusate sodium 100 MG CAPS, Take 100 mg by mouth 2 (two) times daily. (Patient not taking:  Reported on 09/05/2015), Disp: 20 capsule, Rfl: 0 .  ergocalciferol (DRISDOL) 50000 UNITS capsule, Take 1 capsule (50,000 Units total) by mouth once a week. (Patient not taking: Reported on 08/05/2015), Disp: 4 capsule, Rfl: 12 .  pantoprazole (PROTONIX) 20 MG tablet, Take 1 tablet (20 mg total) by mouth 2 (two) times daily before a meal. (Patient not taking: Reported on 08/31/2015), Disp: 30 tablet, Rfl: 0 Allergies  Allergen Reactions  . Folic Acid Other (See Comments)    Makes stomach feel funny     Objective:     BP 91/51 mmHg  Pulse 91  Temp(Src) 98.3 F (36.8 C) (Oral)  Resp 18  SpO2 98%  Alert and oriented  No acute distress  Heart regular rhythm no murmurs  Lungs clear  Abdomen: Bowel sounds normal, soft, nontender  Laboratory No components found for: D1    Assessment:     Upper GI bleed  Cirrhosis of liver  History of hepatitis C  History of hepatocellular carcinoma      Plan:     Patient is going to be admitted to the hospital by the hospitalists. Transfuse blood as needed. PPI therapy. I will plan EGD later today. Lab Results  Component Value Date   HGB 6.1* 09/05/2015   HGB 7.7* 09/05/2015   HGB 12.2* 08/05/2015   HGB 14.6 10/12/2012   HCT 18.0* 09/05/2015   HCT 22.9* 09/05/2015   HCT 36.0* 08/05/2015   HCT 42.7 10/12/2012   ALKPHOS 86 09/05/2015   ALKPHOS 108 08/31/2015   ALKPHOS 109 08/05/2015   ALKPHOS 88 10/12/2012   AST 47* 09/05/2015   AST 25 08/31/2015   AST 39 08/05/2015   AST 111* 10/12/2012   ALT 26 09/05/2015   ALT 20 08/31/2015   ALT 36 08/05/2015   ALT 151* 10/12/2012

## 2015-09-05 NOTE — Consult Note (Signed)
PULMONARY / CRITICAL CARE MEDICINE   Name: Jeffrey Snow MRN: [id number] DOB: 05/09/1956    ADMISSION DATE:  09/05/2015 CONSULTATION DATE:  09/05/15  REFERRING MD :  Dr. Doy Mince   CHIEF COMPLAINT:  GIB   INITIAL PRESENTATION: 59 y/o M with PMH of Hep C+, alcoholic cirrhosis and hepatocellular carcinoma (treated with ablation) who presented to [facility] on 9/20 with complaints of hematemesis that began 10PM on 9/19.    STUDIES:  9/20  EGD >>   SIGNIFICANT EVENTS: 9/20  Presented to [facility] with hematemesis    HISTORY OF PRESENT ILLNESS:  59 y/o M, former ETOH / smoker (quit ~ 2012) with PMH of depression / anxiety, claudication of LE's on oxycodone, Hep C+ (never been treated), alcoholic cirrhosis and hepatocellular carcinoma (treated with transarterial chemotherapy and percutaneous thermal ablation) who presented to [facility] on 9/20 with complaints of hematemesis that began 10PM on 9/19.  The patient reports he had abrupt onset of vomiting around 10 PM.  He indicates he was in his usual state of health the day prior to admission. He lives alone and uses a walker in his home. Otherwise he ambulates outside of his home without a walker. Patient reports he developed vomiting of bright red blood with blood clots. He reports he had approximately 4 episodes of hematemesis. At least twice he describes "filling a [employer] bag".  Patient denies dark stool and significant abdominal pain. He does note some abdominal discomfort but thought it was related to constipation.  He had taken a Dulcolax in an effort to have a BM.  He reports he had a similar episode in January 2016 at which time he had an EGD which showed gastritis and duodenal erosion but no varices.  He denies alcohol intake.  He last had an episode of hematemesis around 0 600 on 9/20. Patient also reported experiencing palpitations when up moving around after episodes of vomiting.  Initial ER evaluation notable for lactic acid 4.42, hemoglobin 7.7 (down  from 12.2 on 08/05/15), and platelets 106. The patient was treated with volume resuscitation. Blood pressure within normal limits.  Repeat lactic acid cleared to 3. EKG demonstrated sinus rhythm.  PCCM consulted for evaluation.   PAST MEDICAL HISTORY :   has a past medical history of Depression; Anxiety; Panic attack; Hypertension; Hepatitis C; Claudication of left lower extremity (m-1); Abnormal MRI of abdomen, liver  (04/21/2014); Cirrhosis, alcoholic (1/0/2725); Liver mass; and [place] (hepatocellular carcinoma).  has past surgical history that includes Cosmetic surgery; Aortogram; abdominal aortagram (N/A, 12/27/2013); lower extremity angiogram (Bilateral, 12/27/2013); and Esophagogastroduodenoscopy (N/A, 12/17/2014).   Prior to Admission medications   Medication Sig Start Date End Date Taking? Authorizing Provider  ALPRAZolam Duanne Moron) 0.5 MG tablet Take 0.5 mg by mouth 2 (two) times daily.    Yes Historical Provider, MD  aspirin 81 MG tablet Take 81 mg by mouth daily.   Yes Historical Provider, MD  benazepril (LOTENSIN) 10 MG tablet Take 1 tablet (10 mg total) by mouth daily. 01/12/15  Yes Leana Gamer, MD  bisacodyl (DULCOLAX) 5 MG EC tablet Take 5 mg by mouth daily as needed for mild constipation or moderate constipation.   Yes Historical Provider, MD  ciprofloxacin (CIPRO) 500 MG tablet Take 1 tablet (500 mg total) by mouth 2 (two) times daily. 08/31/15 09/07/15 Yes Dorena Dew, FNP  FLUoxetine (PROZAC) 40 MG capsule Take 1 capsule by mouth every morning. 05/02/15  Yes Historical Provider, MD  furosemide (LASIX) 20 MG tablet Take 20 mg  by mouth daily.   Yes Historical Provider, MD  oxycodone (OXY-IR) 5 MG capsule Take 1 capsule (5 mg total) by mouth every 4 (four) hours as needed. Patient taking differently: Take 5 mg by mouth every 4 (four) hours as needed for pain.  06/02/15  Yes Dorena Dew, FNP  spironolactone (ALDACTONE) 25 MG tablet Take 75 mg by mouth every morning.    Yes Historical  Provider, MD  zolpidem (AMBIEN) 10 MG tablet Take 10 mg by mouth at bedtime as needed for sleep.    Yes Historical Provider, MD  docusate sodium 100 MG CAPS Take 100 mg by mouth 2 (two) times daily. Patient not taking: Reported on 09/05/2015 06/11/14   Ascencion Dike, PA-C  ergocalciferol (DRISDOL) 50000 UNITS capsule Take 1 capsule (50,000 Units total) by mouth once a week. Patient not taking: Reported on 08/05/2015 03/31/14   Dorena Dew, FNP  pantoprazole (PROTONIX) 20 MG tablet Take 1 tablet (20 mg total) by mouth 2 (two) times daily before a meal. Patient not taking: Reported on 08/31/2015 08/05/15   Domenic Moras, PA-C   Allergies  Allergen Reactions  . Folic Acid Other (See Comments)    Makes stomach feel funny    FAMILY HISTORY:  has no family status information on file.    SOCIAL HISTORY:  reports that he quit smoking about 4 years ago. He has never used smokeless tobacco. He reports that he does not drink alcohol or use illicit drugs.  REVIEW OF SYSTEMS:   Gen: Denies fever, chills, weight change, fatigue, night sweats HEENT: Denies blurred vision, double vision, hearing loss, tinnitus, sinus congestion, rhinorrhea, sore throat, neck stiffness, dysphagia PULM: Denies shortness of breath, cough, sputum production, hemoptysis, wheezing CV: Denies chest pain, edema, orthopnea, paroxysmal nocturnal dyspnea, palpitations GI: Denies abdominal pain, nausea, diarrhea, hematochezia, melena, constipation, change in bowel habits.  Reports hematemesis, see HPI.  Mild abdominal discomfort.  GU: Denies dysuria, hematuria, polyuria, oliguria, urethral discharge Endocrine: Denies hot or cold intolerance, polyuria, polyphagia or appetite change Derm: Denies rash, dry skin, scaling or peeling skin change Heme: Denies easy bruising, bleeding, bleeding gums Neuro: Denies headache, numbness, weakness, slurred speech, loss of memory or consciousness   SUBJECTIVE:   VITAL SIGNS: Temp:  [98.3 F  (36.8 C)] 98.3 F (36.8 C) (09/20 0703) Pulse Rate:  [91-107] 91 (09/20 1121) Resp:  [18-19] 18 (09/20 1121) BP: (91-135)/(49-61) 91/51 mmHg (09/20 1121) SpO2:  [93 %-98 %] 98 % (09/20 1121)   HEMODYNAMICS:     VENTILATOR SETTINGS:     INTAKE / OUTPUT: No intake or output data in the 24 hours ending 09/05/15 1237  PHYSICAL EXAMINATION: General:  Thin adult male in NAD  Neuro:  AAOx4, speech clear, MAE HEENT:  MM pale/dry, no jvd Cardiovascular:  s1s2 rrr, no m/r/g Lungs:  resp's even/non-labored, lungs bilaterally clear, speaking full paragraphs  Abdomen:  Mild epigastric tenderness to palpation, soft, bsx4 active  Musculoskeletal:  No acute deformities  Skin:  Warm/dry, no edema   LABS:  CBC  Recent Labs Lab 09/05/15 0746 09/05/15 1019  WBC 10.2  --   HGB 7.7* 6.1*  HCT 22.9* 18.0*  PLT 106*  --    Coag's  Recent Labs Lab 09/05/15 0746  INR 1.55*   BMET  Recent Labs Lab 08/31/15 1220 09/05/15 0746 09/05/15 1019  NA 137 135 140  K 4.2 6.2* 5.7*  CL 104 105 111  CO2 23 18*  --   BUN 11 50* 45*  CREATININE 0.96 1.31* 1.20  GLUCOSE 74 163* 127*   Electrolytes  Recent Labs Lab 08/31/15 1220 09/05/15 0746  CALCIUM 8.7 8.5*   Sepsis Markers  Recent Labs Lab 09/05/15 0758 09/05/15 1019  LATICACIDVEN 4.42* 3.06*   ABG No results for input(s): PHART, PCO2ART, PO2ART in the last 168 hours.    Liver Enzymes  Recent Labs Lab 08/31/15 1220 09/05/15 0746  AST 25 47*  ALT 20 26  ALKPHOS 108 86  BILITOT 0.6 1.0  ALBUMIN 3.7 2.7*   Cardiac Enzymes No results for input(s): TROPONINI, PROBNP in the last 168 hours.   Glucose No results for input(s): GLUCAP in the last 168 hours.  Imaging No results found.   ASSESSMENT / PLAN:  GASTROINTESTINAL A:   GIB  Hematemesis  P:   GI consulted, appreciate input  PPI gtt EGD planned for 9/20 afternoon PRN zofran  NPO  HEMATOLOGIC A:   Acute Blood Loss Anemia - in setting of  GIB P:  Transfuse 1 unit PRBC's now  Hemodynamically stable / no further bleeding, if change, will place central line Trend CBC Follow up CBC post transfusion   PULMONARY OETT A: Former Smoker P:   Monitor respiratory status Oxygen as needed to support saturations 90-95%  CARDIOVASCULAR CVL A:  Hx HTN - BP normal on admit P:  Monitor in SDU  Hold home antihypertensive / diuretic regimen  NS at 75  RENAL A:   Hyperkalemia  AKI - in setting of volume depletion  Elevated Lactic Acid - clearing, elevated in setting of GIB P:   Monitor BMP / UOP  Replace electrolytes as indicated  ENDOCRINE A:   At Risk Hypoglycemia - in setting of NPO status with hx of liver disease  P:   Monitor glucose on BMP   NEUROLOGIC A:   Pain  P:   RASS goal: n/a  Minimize sedation as able   FAMILY  - Updates: Patient updated on plan of care.   - Inter-disciplinary family meet or Palliative Care meeting due by:  9/27    Noe Gens, NP-C [facility] Pgr: [phone] or if no answer [phone] 09/05/2015, 12:37 PM    Attending:  I have seen and examined the patient with nurse practitioner/resident and agree with the note above.   I saw Mr. Susman in the ER on 9/20 with Noe Gens  He notes hematemesis overnight, this is the first time he has ever had this No belly pain.  Has not had hematemesis in the ER  Vitals normal  On exam, mildly tender abdomen, but soft, bowel sounds positive, conversant, no distress  Former alcoholic with Hep C and cirrhosis here with an upper GI bleed.  Not bleeding actively in the ER but in need of transfusion and endoscopy.  We recommended admission to the step down Unit and GI consultation.    Will be available prn  Roselie Awkward, MD [facility] Pager: [phone] Cell: [phone] After 3pm or if no response, call [phone]

## 2015-09-05 NOTE — Telephone Encounter (Signed)
Reviewed [place] Substance Reporting system prior to reorder. No inconsistencies noted.   Meds ordered this encounter  Medications  . oxycodone (OXY-IR) 5 MG capsule    Sig: Take 1 capsule (5 mg total) by mouth every 4 (four) hours as needed.    Dispense:  30 capsule    Refill:  0    Order Specific Question:  Supervising Provider    Answer:  Tresa Garter [[id number]]    Dorena Dew, FNP

## 2015-09-05 NOTE — Progress Notes (Signed)
Utilization Review completed.  [employer] RN CM  

## 2015-09-05 NOTE — H&P (Signed)
[facility] History and Physical  LOVE MILBOURNE GMW:[id number] DOB: 1956/04/05 DOA: 09/05/2015  Referring physician: Dr. Dillard Essex PCP: [name],[name], MD   Chief Complaint: Hematemesis  HPI: Jeffrey Snow is a 59 y.o. male with past medical history of depression and anxiety, hepatitis C, history of hepatocellular carcinoma with a history of alcoholic cirrhosis hasn't drank in over 5 years, essential hypertension that comes in for coffee ground hematemesis with blood clots that started the day prior to admission. He relates he has vomited blood about 4-6 times overnight. He relates he slept with a bucket next to him and in the middle of the night he also woke up vomiting blood. When he woke up this morning he felt palpitations and lightheaded upon standing, with generalized weakness and shortness of breath. He related he has not consumed any alcohol or any NSAIDs.  He has not had a bowel movement but denies any history in the past 2 or 3 days of melanotic stools.  In the ED: Vitals were checked and he was hypotensive you given 2 L of normal saline and 2 large bores IVs were placed, he was given 2 units of packed red blood cells and GI was consulted who recommended an endoscopy. He was also started on Protonix IV   Review of Systems:  Constitutional:  No weight loss, night sweats, Fevers, chills, fatigue.  HEENT:  No headaches, Difficulty swallowing,Tooth/dental problems,Sore throat,  No sneezing, itching, ear ache, nasal congestion, post nasal drip,  Cardio-vascular:  No chest pain, Orthopnea, PND, swelling in lower extremities, anasarca, dizziness, palpitations  GI:  No heartburn, indigestion, abdominal pain diarrhea, change in bowel habits, loss of appetite  Resp:  No shortness of breath with exertion or at rest. No excess mucus, no productive cough, No non-productive cough, No coughing up of blood.No change in color of mucus.No wheezing.No chest wall deformity  Skin:   no rash or lesions.  GU:  no dysuria, change in color of urine, no urgency or frequency. No flank pain.  Musculoskeletal:  No joint pain or swelling. No decreased range of motion. No back pain.  Psych:  No change in mood or affect. No depression or anxiety. No memory loss.   Past Medical History  Diagnosis Date  . Depression   . Anxiety   . Panic attack   . Hypertension   . Hepatitis C   . Claudication of left lower extremity m-1  . Abnormal MRI of abdomen, liver  04/21/2014  . Cirrhosis, alcoholic 03/19/346  . Liver mass   . [place] (hepatocellular carcinoma)    Past Surgical History  Procedure Laterality Date  . Cosmetic surgery      facial reconstructive surgery  . Aortogram    . Abdominal aortagram N/A 12/27/2013    Procedure: ABDOMINAL Maxcine Ham;  Surgeon: Conrad Miller, MD;  Location: [facility] CATH LAB;  Service: Cardiovascular;  Laterality: N/A;  . Lower extremity angiogram Bilateral 12/27/2013    Procedure: LOWER EXTREMITY ANGIOGRAM;  Surgeon: Conrad [place], MD;  Location: [facility] CATH LAB;  Service: Cardiovascular;  Laterality: Bilateral;  . Esophagogastroduodenoscopy N/A 12/17/2014    Procedure: ESOPHAGOGASTRODUODENOSCOPY (EGD);  Surgeon: Missy Sabins, MD;  Location: [facility] ENDOSCOPY;  Service: Endoscopy;  Laterality: N/A;   Social History:  reports that he quit smoking about 4 years ago. He has never used smokeless tobacco. He reports that he does not drink alcohol or use illicit drugs.  Allergies  Allergen Reactions  . Folic Acid Other (See Comments)  Makes stomach feel funny    Family History  Problem Relation Age of Onset  . Diabetes Father   . Diabetes Brother   . Diabetes Brother     Prior to Admission medications   Medication Sig Start Date End Date Taking? Authorizing Provider  ALPRAZolam Duanne Moron) 0.5 MG tablet Take 0.5 mg by mouth 2 (two) times daily.    Yes Historical Provider, MD  aspirin 81 MG tablet Take 81 mg by mouth daily.   Yes Historical Provider, MD  benazepril  (LOTENSIN) 10 MG tablet Take 1 tablet (10 mg total) by mouth daily. 01/12/15  Yes Leana Gamer, MD  bisacodyl (DULCOLAX) 5 MG EC tablet Take 5 mg by mouth daily as needed for mild constipation or moderate constipation.   Yes Historical Provider, MD  ciprofloxacin (CIPRO) 500 MG tablet Take 1 tablet (500 mg total) by mouth 2 (two) times daily. 08/31/15 09/07/15 Yes Dorena Dew, FNP  FLUoxetine (PROZAC) 40 MG capsule Take 1 capsule by mouth every morning. 05/02/15  Yes Historical Provider, MD  furosemide (LASIX) 20 MG tablet Take 20 mg by mouth daily.   Yes Historical Provider, MD  spironolactone (ALDACTONE) 25 MG tablet Take 75 mg by mouth every morning.    Yes Historical Provider, MD  zolpidem (AMBIEN) 10 MG tablet Take 10 mg by mouth at bedtime as needed for sleep.    Yes Historical Provider, MD  docusate sodium 100 MG CAPS Take 100 mg by mouth 2 (two) times daily. Patient not taking: Reported on 09/05/2015 06/11/14   Ascencion Dike, PA-C  ergocalciferol (DRISDOL) 50000 UNITS capsule Take 1 capsule (50,000 Units total) by mouth once a week. Patient not taking: Reported on 08/05/2015 03/31/14   Dorena Dew, FNP  oxycodone (OXY-IR) 5 MG capsule Take 1 capsule (5 mg total) by mouth every 4 (four) hours as needed. 09/05/15   Dorena Dew, FNP  pantoprazole (PROTONIX) 20 MG tablet Take 1 tablet (20 mg total) by mouth 2 (two) times daily before a meal. Patient not taking: Reported on 08/31/2015 08/05/15   Domenic Moras, PA-C   Physical Exam: Filed Vitals:   09/05/15 7124 09/05/15 0703 09/05/15 0709 09/05/15 1121  BP:  96/49 135/61 91/51  Pulse:  107  91  Temp:  98.3 F (36.8 C)    TempSrc:  Oral    Resp:  19  18  SpO2: 98% 93%  98%    Wt Readings from Last 3 Encounters:  08/31/15 68.947 kg (152 lb)  08/05/15 68.04 kg (150 lb)  06/01/15 69.4 kg (153 lb)    General:  Appears calm and comfortable Eyes: PERRL, normal lids, irises & conjunctiva ENT: grossly normal hearing, lips &  tongue Neck: no LAD, masses or thyromegaly Cardiovascular: RRR, no m/r/g. No LE edema. Telemetry: SR, no arrhythmias  Respiratory: CTA bilaterally, no w/r/r. Normal respiratory effort. Abdomen: soft, tender in the right lower quadrant no epigastric tenderness no rebound or guarding. Skin: no rash or induration seen on limited exam Musculoskeletal: grossly normal tone BUE/BLE Psychiatric: grossly normal mood and affect, speech fluent and appropriate Neurologic: grossly non-focal.          Labs on Admission:  Basic Metabolic Panel:  Recent Labs Lab 08/31/15 1220 09/05/15 0746 09/05/15 1019  NA 137 135 140  K 4.2 6.2* 5.7*  CL 104 105 111  CO2 23 18*  --   GLUCOSE 74 163* 127*  BUN 11 50* 45*  CREATININE 0.96 1.31* 1.20  CALCIUM 8.7  8.5*  --    Liver Function Tests:  Recent Labs Lab 08/31/15 1220 09/05/15 0746  AST 25 47*  ALT 20 26  ALKPHOS 108 86  BILITOT 0.6 1.0  PROT 6.3 5.1*  ALBUMIN 3.7 2.7*   No results for input(s): LIPASE, AMYLASE in the last 168 hours.  Recent Labs Lab 09/05/15 0746  AMMONIA 106*   CBC:  Recent Labs Lab 09/05/15 0746 09/05/15 1019  WBC 10.2  --   NEUTROABS 8.4*  --   HGB 7.7* 6.1*  HCT 22.9* 18.0*  MCV 98.7  --   PLT 106*  --    Cardiac Enzymes: No results for input(s): CKTOTAL, CKMB, CKMBINDEX, TROPONINI in the last 168 hours.  BNP (last 3 results) No results for input(s): BNP in the last 8760 hours.  ProBNP (last 3 results) No results for input(s): PROBNP in the last 8760 hours.  CBG: No results for input(s): GLUCAP in the last 168 hours.  Radiological Exams on Admission: No results found.  EKG: Independently reviewed. *Normal sinus rhythm normal axis.  Assessment/Plan Acute upper GI bleed Anemia, blood loss: - On his last endoscopy in January 2016 he did not have any varices, he denies any NSAIDs use, except for baby aspirin, he denies any alcohol abuse. I agree with the 2 large bores IV's, 2 L of normal  saline and 2 units of packed red blood cells. He has already been started on Protonix IV GI has been consulted recommended an endoscopy today. We'll check a CBC posttransfusion line transfuse as needed.  Lactic acidosis due to hemorrhagic shock: - Likely due to hypovolemia, he has a ready received 2 L of normal saline and 2 units of pack red blood cells, monitor strict I's and O's is elected as was elevated due to hypovolemia. We'll continue to monitor his hemoglobin and transfuse as needed. - We'll admit him to step down. Today, normal saline at 125 mL an hour.  Hyperkalemia: - Likely due to hypovolemia and that setting of ACE inhibitor and Aldactone, I will give him aggressive IV fluid hydration and recheck in the morning hold his ace.  Essential  HTN (hypertension): Hold all antihypertensive medications including Aldactone and Lasix.  Elevated LFTs/Cirrhosis, alcoholic - He relates he hasn't consumed alcohol in over 5 years but his LFTs are split almost 2-1. - I will check a GGT, start him on thiamine and folate and monitor with CIWA protocol.  [place] (hepatocellular carcinoma): Patient has completed his radiation treatment will follow-up with radiation oncology as an outpatient every 6 months.  [facility] neurology [name] has a ready been consulted by the ED physician and will take him for endoscopy today.  Code Status: full DVT Prophylaxis:SCD's Family Communication: none Disposition Plan: home in 2-3 days  Time spent: 65 min  Charlynne Cousins [facility] Pager [phone]

## 2015-09-05 NOTE — ED Provider Notes (Signed)
CSN: [id number]     Arrival date & time 09/05/15  1655 History   First MD Initiated Contact with Patient 09/05/15 [phone]     Chief Complaint  Patient presents with  . Hemoptysis     (Consider location/radiation/quality/duration/timing/severity/associated sxs/prior Treatment) Patient is a 59 y.o. male presenting with vomiting.  Emesis Severity:  Moderate Duration:  9 hours Timing:  Constant Quality:  Bright red blood (with blood clots) Progression:  Unchanged Chronicity:  Recurrent Relieved by:  Nothing Worsened by:  Liquids Associated symptoms: abdominal pain   Associated symptoms: no fever   Associated symptoms comment:  SOB, weakness, fatigue   Past Medical History  Diagnosis Date  . Depression   . Anxiety   . Panic attack   . Hypertension   . Hepatitis C   . Claudication of left lower extremity m-1  . Abnormal MRI of abdomen, liver  04/21/2014  . Cirrhosis, alcoholic 01/22/785  . Liver mass   . [place] (hepatocellular carcinoma)    Past Surgical History  Procedure Laterality Date  . Cosmetic surgery      facial reconstructive surgery  . Aortogram    . Abdominal aortagram N/A 12/27/2013    Procedure: ABDOMINAL Maxcine Ham;  Surgeon: Conrad [place], MD;  Location: [facility] CATH LAB;  Service: Cardiovascular;  Laterality: N/A;  . Lower extremity angiogram Bilateral 12/27/2013    Procedure: LOWER EXTREMITY ANGIOGRAM;  Surgeon: Conrad Franklin, MD;  Location: [facility] CATH LAB;  Service: Cardiovascular;  Laterality: Bilateral;  . Esophagogastroduodenoscopy N/A 12/17/2014    Procedure: ESOPHAGOGASTRODUODENOSCOPY (EGD);  Surgeon: Missy Sabins, MD;  Location: [facility] ENDOSCOPY;  Service: Endoscopy;  Laterality: N/A;   Family History  Problem Relation Age of Onset  . Diabetes Father   . Diabetes Brother   . Diabetes Brother    Social History  Substance Use Topics  . Smoking status: Former Smoker -- 0.50 packs/day    Quit date: 08/21/2011  . Smokeless tobacco: Never Used  . Alcohol Use: No   Comment: Occasional beer on the weekends    Review of Systems  Gastrointestinal: Positive for vomiting and abdominal pain.  All other systems reviewed and are negative.     Allergies  Folic acid  Home Medications   Prior to Admission medications   Medication Sig Start Date End Date Taking? Authorizing Provider  ALPRAZolam Duanne Moron) 0.5 MG tablet Take 0.5 mg by mouth 2 (two) times daily.     Historical Provider, MD  aspirin 81 MG tablet Take 81 mg by mouth daily.    Historical Provider, MD  benazepril (LOTENSIN) 10 MG tablet Take 1 tablet (10 mg total) by mouth daily. 01/12/15   Leana Gamer, MD  bisacodyl (DULCOLAX) 5 MG EC tablet Take 5 mg by mouth daily as needed for mild constipation or moderate constipation.    Historical Provider, MD  ciprofloxacin (CIPRO) 500 MG tablet Take 1 tablet (500 mg total) by mouth 2 (two) times daily. 08/31/15 09/07/15  Dorena Dew, FNP  docusate sodium 100 MG CAPS Take 100 mg by mouth 2 (two) times daily. 06/11/14   Ascencion Dike, PA-C  ergocalciferol (DRISDOL) 50000 UNITS capsule Take 1 capsule (50,000 Units total) by mouth once a week. Patient not taking: Reported on 08/05/2015 03/31/14   Dorena Dew, FNP  FLUoxetine (PROZAC) 40 MG capsule Take 1 capsule by mouth every morning. 05/02/15   Historical Provider, MD  furosemide (LASIX) 20 MG tablet Take 20 mg by mouth daily.    Historical Provider,  MD  oxycodone (OXY-IR) 5 MG capsule Take 1 capsule (5 mg total) by mouth every 4 (four) hours as needed. Patient taking differently: Take 5 mg by mouth every 4 (four) hours as needed for pain.  06/02/15   Dorena Dew, FNP  pantoprazole (PROTONIX) 20 MG tablet Take 1 tablet (20 mg total) by mouth 2 (two) times daily before a meal. Patient not taking: Reported on 08/31/2015 08/05/15   Domenic Moras, PA-C  spironolactone (ALDACTONE) 25 MG tablet Take 75 mg by mouth every morning.     Historical Provider, MD  thiamine 100 MG tablet Take 1 tablet (100 mg  total) by mouth daily. Patient not taking: Reported on 08/05/2015 05/02/14   Modena Jansky, MD  zolpidem (AMBIEN) 10 MG tablet Take 10 mg by mouth at bedtime.     Historical Provider, MD   BP 96/49 mmHg  Pulse 107  Temp(Src) 98.3 F (36.8 C) (Oral)  Resp 19  SpO2 93% Physical Exam  Constitutional: He is oriented to person, place, and time. He appears well-developed and well-nourished. No distress.  HENT:  Head: Normocephalic and atraumatic.  Mouth/Throat: Oropharynx is clear and moist.  Dried blood around lips and in back of oropharynx  Eyes: Conjunctivae are normal. Pupils are equal, round, and reactive to light. No scleral icterus.  Neck: Neck supple.  Cardiovascular: Normal rate, regular rhythm, normal heart sounds and intact distal pulses.   No murmur heard. Pulmonary/Chest: Effort normal and breath sounds normal. No stridor. No respiratory distress. He has no wheezes. He has no rales.  Abdominal: Soft. He exhibits no distension. There is tenderness in the right upper quadrant and epigastric area. There is no rigidity, no rebound and no guarding.  Musculoskeletal: Normal range of motion. He exhibits no edema.  Neurological: He is alert and oriented to person, place, and time.  Skin: Skin is warm and dry. No rash noted.  Psychiatric: He has a normal mood and affect. His behavior is normal.  Nursing note and vitals reviewed.   ED Course  CRITICAL CARE Performed by: Serita Grit Authorized by: Serita Grit Total critical care time: 45 minutes Critical care time was exclusive of separately billable procedures and treating other patients. Critical care was necessary to treat or prevent imminent or life-threatening deterioration of the following conditions: circulatory failure. Critical care was time spent personally by me on the following activities: development of treatment plan with patient or surrogate, discussions with consultants, evaluation of patient's response to  treatment, examination of patient, obtaining history from patient or surrogate, ordering and performing treatments and interventions, ordering and review of laboratory studies, ordering and review of radiographic studies, pulse oximetry, re-evaluation of patient's condition and review of old charts.   (including critical care time) Labs Review Labs Reviewed  AMMONIA - Abnormal; Notable for the following:    Ammonia 106 (*)    All other components within normal limits  BASIC METABOLIC PANEL - Abnormal; Notable for the following:    Potassium 6.2 (*)    CO2 18 (*)    Glucose, Bld 163 (*)    BUN 50 (*)    Creatinine, Ser 1.31 (*)    Calcium 8.5 (*)    GFR calc non Af Amer 58 (*)    All other components within normal limits  HEPATIC FUNCTION PANEL - Abnormal; Notable for the following:    Total Protein 5.1 (*)    Albumin 2.7 (*)    AST 47 (*)    All other  components within normal limits  CBC WITH DIFFERENTIAL/PLATELET - Abnormal; Notable for the following:    RBC 2.32 (*)    Hemoglobin 7.7 (*)    HCT 22.9 (*)    Platelets 106 (*)    Neutro Abs 8.4 (*)    All other components within normal limits  PROTIME-INR - Abnormal; Notable for the following:    Prothrombin Time 18.7 (*)    INR 1.55 (*)    All other components within normal limits  I-STAT CG4 LACTIC ACID, ED - Abnormal; Notable for the following:    Lactic Acid, Venous 4.42 (*)    All other components within normal limits  I-STAT CHEM 8, ED - Abnormal; Notable for the following:    Potassium 5.7 (*)    BUN 45 (*)    Glucose, Bld 127 (*)    Hemoglobin 6.1 (*)    HCT 18.0 (*)    All other components within normal limits  I-STAT CG4 LACTIC ACID, ED - Abnormal; Notable for the following:    Lactic Acid, Venous 3.06 (*)    All other components within normal limits  I-STAT CG4 LACTIC ACID, ED - Abnormal; Notable for the following:    Lactic Acid, Venous 2.63 (*)    All other components within normal limits  POC OCCULT  BLOOD, ED  TYPE AND SCREEN  PREPARE RBC (CROSSMATCH)    Imaging Review No results found. I have personally reviewed and evaluated these images and lab results as part of my medical decision-making.   EKG Interpretation   Date/Time:  Tuesday September 05 2015 08:43:57 EDT Ventricular Rate:  89 PR Interval:  139 QRS Duration: 75 QT Interval:  357 QTC Calculation: 434 R Axis:   4 Text Interpretation:  Sinus rhythm Low voltage, extremity and precordial  leads No significant change was found Confirmed by [facility]  MD, TREY  ([id number]) on 09/05/2015 4:20:41 PM      MDM   Final diagnoses:  Acute upper GI bleed  Lactic acidosis  Hyperkalemia    Hg very low and lactic acid elevated.  He required IV protonix and ultimately blood transfusion.  I consulted GI, critical care, and internal medicine.  Admitted to stepdown unit.      Serita Grit, MD 09/05/15 [phone]

## 2015-09-05 NOTE — ED Notes (Signed)
MD at bedside for admitting

## 2015-09-05 NOTE — ED Notes (Signed)
Per EMS pt has been experiencing hemoptysis since 2230 last night.  Pt told EMS that he has a hx of "internal bleeding."  Pt is A&O x 4.  Denies pain.  Pt also states that his abdomen will begin to have a burning sensation prior to episode of hemoptysis.

## 2015-09-05 NOTE — Op Note (Signed)
[place], [id number]   ENDOSCOPY PROCEDURE REPORT  PATIENT: Jeffrey Snow, Jeffrey Snow  MR#: [id number] BIRTHDATE: Dec 31, 1955 , 59  yrs. old GENDER: male ENDOSCOPIST: Acquanetta Sit, MD REFERRED BY: PROCEDURE DATE:  09-10-15 PROCEDURE:  EGD ASA CLASS:      3 INDICATIONS:  upper GI bleed MEDICATIONS: fentanyl 75 g IV, Versed 5 mg IV, Benadryl 25 mg IV TOPICAL ANESTHETIC: Cetacaine spray  DESCRIPTION OF PROCEDURE: After the risks benefits and alternatives of the procedure were thoroughly explained, informed consent was obtained.  The Pentax Gastroscope [id number] endoscope was introduced through the mouth and advanced to the second portion of the duodenum , Without limitations.  The instrument was slowly withdrawn as the mucosa was fully examined. Estimated blood loss is zero unless otherwise noted in this procedure report.  Findings:  Esophagus: No esophageal varices. At the esophagogastric junction there was a small ulceration. Surrounding the esophagogastric junction the mucosa was very erythematous. No active bleeding.  Stomach: Moderate size hiatal hernia. Erythematous gastric mucosa. Moderate amount of blackish-colored liquid in the stomach but no evidence of active bleeding and no red blood.  Duodenum: Patchy erythema in the bulb    The scope was then withdrawn from the patient and the procedure completed.  COMPLICATIONS: There were no immediate complications.  ENDOSCOPIC IMPRESSION:see above   RECOMMENDATIONS:PPI therapy. Avoid aspirin and NSAIDs.   REPEAT EXAM:  eSignedAcquanetta Sit, MD 09-10-15 3:38 PM    CC:  CPT CODES: ICD CODES:  The ICD and CPT codes recommended by this software are interpretations from the data that the clinical staff has captured with the software.  The verification of the translation of this report to the ICD and CPT codes and modifiers is the sole responsibility of the health care  institution and practicing physician where this report was generated.  [place]. will not be held responsible for the validity of the ICD and CPT codes included on this report.  AMA assumes no liability for data contained or not contained herein. CPT is a Designer, television/film set of the [employer].  PATIENT NAME:  [name], [name] MR#: [id number]

## 2015-09-05 NOTE — ED Notes (Signed)
Bed: XK48 Expected date:  Expected time:  Means of arrival:  Comments: EMS 59 yo male vomiting blood

## 2015-09-06 DIAGNOSIS — R579 Shock, unspecified: Secondary | ICD-10-CM

## 2015-09-06 DIAGNOSIS — R7989 Other specified abnormal findings of blood chemistry: Secondary | ICD-10-CM

## 2015-09-06 DIAGNOSIS — D696 Thrombocytopenia, unspecified: Secondary | ICD-10-CM

## 2015-09-06 DIAGNOSIS — E875 Hyperkalemia: Secondary | ICD-10-CM

## 2015-09-06 DIAGNOSIS — C220 Liver cell carcinoma: Secondary | ICD-10-CM

## 2015-09-06 DIAGNOSIS — I10 Essential (primary) hypertension: Secondary | ICD-10-CM

## 2015-09-06 LAB — CBC
HCT: 25.4 % — ABNORMAL LOW (ref 39.0–52.0)
HCT: 26 % — ABNORMAL LOW (ref 39.0–52.0)
HCT: 24.4 % — ABNORMAL LOW (ref 39.0–52.0)
Hemoglobin: 8.7 g/dL — ABNORMAL LOW (ref 13.0–17.0)
Hemoglobin: 8.9 g/dL — ABNORMAL LOW (ref 13.0–17.0)
Hemoglobin: 8.2 g/dL — ABNORMAL LOW (ref 13.0–17.0)
MCH: 32.5 pg (ref 26.0–34.0)
MCH: 32 pg (ref 26.0–34.0)
MCH: 32.2 pg (ref 26.0–34.0)
MCHC: 34.3 g/dL (ref 30.0–36.0)
MCHC: 34.2 g/dL (ref 30.0–36.0)
MCHC: 33.6 g/dL (ref 30.0–36.0)
MCV: 94.8 fL (ref 78.0–100.0)
MCV: 93.5 fL (ref 78.0–100.0)
MCV: 95.7 fL (ref 78.0–100.0)
Platelets: 56 10*3/uL — ABNORMAL LOW (ref 150–400)
Platelets: 58 10*3/uL — ABNORMAL LOW (ref 150–400)
Platelets: 55 10*3/uL — ABNORMAL LOW (ref 150–400)
RBC: 2.68 MIL/uL — ABNORMAL LOW (ref 4.22–5.81)
RBC: 2.78 MIL/uL — ABNORMAL LOW (ref 4.22–5.81)
RBC: 2.55 MIL/uL — ABNORMAL LOW (ref 4.22–5.81)
RDW: 14.7 % (ref 11.5–15.5)
RDW: 15.3 % (ref 11.5–15.5)
RDW: 15.7 % — ABNORMAL HIGH (ref 11.5–15.5)
WBC: 7.4 10*3/uL (ref 4.0–10.5)
WBC: 7.6 10*3/uL (ref 4.0–10.5)
WBC: 5 10*3/uL (ref 4.0–10.5)

## 2015-09-06 LAB — TYPE AND SCREEN
ABO/RH(D): O POS
Antibody Screen: NEGATIVE
Unit division: 0
Unit division: 0

## 2015-09-06 LAB — COMPREHENSIVE METABOLIC PANEL
ALT: 24 U/L (ref 17–63)
AST: 34 U/L (ref 15–41)
Albumin: 2.8 g/dL — ABNORMAL LOW (ref 3.5–5.0)
Alkaline Phosphatase: 64 U/L (ref 38–126)
Anion gap: 4 — ABNORMAL LOW (ref 5–15)
BUN: 44 mg/dL — ABNORMAL HIGH (ref 6–20)
CO2: 21 mmol/L — ABNORMAL LOW (ref 22–32)
Calcium: 8.4 mg/dL — ABNORMAL LOW (ref 8.9–10.3)
Chloride: 117 mmol/L — ABNORMAL HIGH (ref 101–111)
Creatinine, Ser: 1.02 mg/dL (ref 0.61–1.24)
GFR calc Af Amer: >60 mL/min (ref >60)
GFR calc non Af Amer: >60 mL/min (ref >60)
Glucose, Bld: 86 mg/dL (ref 65–99)
Potassium: 4.2 mmol/L (ref 3.5–5.1)
Sodium: 142 mmol/L (ref 135–145)
Total Bilirubin: 1.1 mg/dL (ref 0.3–1.2)
Total Protein: 4.9 g/dL — ABNORMAL LOW (ref 6.5–8.1)

## 2015-09-06 MED ORDER — BOOST / RESOURCE BREEZE PO LIQD
1.0000 | Freq: Three times a day (TID) | ORAL | Status: DC
  Administered 2015-09-06 – 2015-09-09 (×7): 1 via ORAL

## 2015-09-06 MED ORDER — DEXTROSE-NACL 5-0.45 % IV SOLN
INTRAVENOUS | Status: DC
  Filled 2015-09-06: qty 1000

## 2015-09-06 MED ORDER — DEXTROSE-NACL 5-0.45 % IV SOLN
INTRAVENOUS | Status: DC
  Administered 2015-09-06 – 2015-09-07 (×3): via INTRAVENOUS

## 2015-09-06 NOTE — Clinical Documentation Improvement (Signed)
Hospitalist  Please document a Final Diagnosis for cause of the GI bleed if possible:  Please exercise your independent, professional judgment when responding. A specific answer is not anticipated or expected.  Supporting Information: (As per notes) "Patient denies any further hematemesis. Patient states that he did have a dark bowel movement this morning that seemed like a clot. Status post upper endoscopy 09/05/2015 per [name] which showed small ulceration at the GE junction. Surrounding GE junction mucosa very erythematous with no active bleed. Moderate hiatal hernia. Erythematous gastric mucosa. Moderate blackish colored liquid in the stomach with no evidence of active bleed. Patchy erythema in the duodenal bulb. Patient is status post 2 units packed red blood cells(09/05/2015). Hemoglobin currently stable at 8.9. Continue PPI."  Thank You, Alessandra Grout, RN, BSN, CCDS,Clinical Documentation Specialist:  [phone]  [phone]=[employer]- Health Information Management

## 2015-09-06 NOTE — Progress Notes (Signed)
[facility] PROGRESS NOTE  Jeffrey Snow VOJ:[id number] DOB: Feb 06, 1956 DOA: 09/05/2015 PCP: [name],[name], MD  Assessment/Plan: #1 acute upper GI bleed Questionable etiology. Patient denies any further hematemesis. Patient states that he did have a dark bowel movement this morning that seemed like a clot. Status post upper endoscopy 09/05/2015 per [name] which showed small ulceration at the GE junction. Surrounding GE junction mucosa very erythematous with no active bleed. Moderate hiatal hernia. Erythematous gastric mucosa. Moderate blackish colored liquid in the stomach with no evidence of active bleed. Patchy erythema in the duodenal bulb. Patient is status post 2 units packed red blood cells(09/05/2015). Hemoglobin currently stable at 8.9. Continue PPI. Avoid aspirin and NSAIDs. Advance to clear liquid diet. GI following and appreciate input and recommendations.  #2 hemorrhagic shock Secondary to problem #1. Status post 2 units pack red blood cells. Blood pressure improved. Antihypertensives on hold. Continue volume resuscitation with IV fluids. PCCM following. Follow.  #3 acute blood loss anemia Secondary to problem #1. Status post 2 units pack red blood cells 09/05/2015. Hemoglobin currently stable at 8.9. Chest lesions were showed hemoglobin less than 7.  #4 hyperkalemia Likely secondary to volume depletion in the setting of ACE inhibitor and Aldactone. ACE inhibitor and Aldactone on hold. Hyperkalemia has resolved. Follow.  #5 hypertension Antihypertensives medications on hold. Follow.   #6 elevated LFTs/alcoholic cirrhosis Patient denies any alcohol RESUMPTION IN OVER 5 YEARS. LFTs have normalized. Continue thiamine and folic acid. Continue the Ativan withdrawal protocol.  #7 hepatocellular carcinoma Status post transarterial chemotherapy and percutaneous thermal ablation. Outpatient follow-up.  #8 chronic thrombocytopenia Stable.  #9 prehospitalization  UTI Continue ciprofloxacin for 3 more days as patient states had 3 days left of antibiotics to take.  #10 prophylaxis PPI for GI prophylaxis. SCDs for DVT prophylaxis.    Code Status: Full Family Communication: Updated patient. No family at bedside. Disposition Plan: Remain the step down unit.   Consultants:  Gastroenterology: [name] 09/05/2015  PCCM: Dr [name] 09/05/2015  Procedures:  Upper endoscopy: [name] 09/05/2015  2 units pack red blood cells 09/05/2015  Antibiotics:  None  HPI/Subjective: Patient denies any further hematemesis. Patient states had a bowel movement this morning that was dark and looked like a clot. Patient denies any further abdominal pain after having bowel movement. No chest pain. No shortness of breath.  Objective: Filed Vitals:   09/06/15 0800  BP:   Pulse:   Temp: 98.1 F (36.7 C)  Resp:     Intake/Output Summary (Last 24 hours) at 09/06/15 0921 Last data filed at 09/06/15 0600  Gross per 24 hour  Intake   2051 ml  Output   1100 ml  Net    951 ml   Filed Weights   09/05/15 1830  Weight: 68.1 kg (150 lb 2.1 oz)    Exam:   General:  NAD  Cardiovascular: RRR  Respiratory: CTAB  Abdomen: Soft, nontender, nondistended, positive bowel sounds.  Musculoskeletal: No clubbing cyanosis or edema.  Data Reviewed: Basic Metabolic Panel:  Recent Labs Lab 08/31/15 1220 09/05/15 0746 09/05/15 1019 09/06/15 0610  NA 137 135 140 142  K 4.2 6.2* 5.7* 4.2  CL 104 105 111 117*  CO2 23 18*  --  21*  GLUCOSE 74 163* 127* 86  BUN 11 50* 45* 44*  CREATININE 0.96 1.31* 1.20 1.02  CALCIUM 8.7 8.5*  --  8.4*   Liver Function Tests:  Recent Labs Lab 08/31/15 1220 09/05/15 0746 09/06/15 0610  AST  25 47* 34  ALT 20 26 24   ALKPHOS 108 86 64  BILITOT 0.6 1.0 1.1  PROT 6.3 5.1* 4.9*  ALBUMIN 3.7 2.7* 2.8*   No results for input(s): LIPASE, AMYLASE in the last 168 hours.  Recent Labs Lab 09/05/15 0746  AMMONIA 106*    CBC:  Recent Labs Lab 09/05/15 0746 09/05/15 1019 09/06/15 0212 09/06/15 0610  WBC 10.2  --  7.4 7.6  NEUTROABS 8.4*  --   --   --   HGB 7.7* 6.1* 8.7* 8.9*  HCT 22.9* 18.0* 25.4* 26.0*  MCV 98.7  --  94.8 93.5  PLT 106*  --  56* 58*   Cardiac Enzymes: No results for input(s): CKTOTAL, CKMB, CKMBINDEX, TROPONINI in the last 168 hours. BNP (last 3 results) No results for input(s): BNP in the last 8760 hours.  ProBNP (last 3 results) No results for input(s): PROBNP in the last 8760 hours.  CBG: No results for input(s): GLUCAP in the last 168 hours.  Recent Results (from the past 240 hour(s))  Urine culture     Status: None   Collection Time: 08/31/15  4:25 PM  Result Value Ref Range Status   Culture STAPHYLOCOCCUS SPECIES (COAGULASE NEGATIVE)  Final   Colony Count >=100,000 COLONIES/ML  Final   Organism ID, Bacteria STAPHYLOCOCCUS SPECIES (COAGULASE NEGATIVE)  Final    Comment: Rifampin and Gentamicin should not be used as single drugs for treatment of Staph infections.       Susceptibility   Staphylococcus species (coagulase negative) -  (no method available)    OXACILLIN <=0.25 Sensitive     CEFAZOLIN  Sensitive     GENTAMICIN <=0.5 Sensitive     CIPROFLOXACIN <=0.5 Sensitive     LEVOFLOXACIN 0.25 Sensitive     NITROFURANTOIN <=16 Sensitive     TRIMETH/SULFA 160 Resistant     VANCOMYCIN 1 Sensitive     RIFAMPIN <=0.5 Sensitive     TETRACYCLINE 2 Sensitive   MRSA PCR Screening     Status: None   Collection Time: 09/05/15  6:50 PM  Result Value Ref Range Status   MRSA by PCR NEGATIVE NEGATIVE Final    Comment:        The GeneXpert MRSA Assay (FDA approved for NASAL specimens only), is one component of a comprehensive MRSA colonization surveillance program. It is not intended to diagnose MRSA infection nor to guide or monitor treatment for MRSA infections.      Studies: No results found.  Scheduled Meds: . ALPRAZolam  0.5 mg Oral BID  .  ciprofloxacin  500 mg Oral BID  . FLUoxetine  40 mg Oral q morning - 13K  . folic acid  1 mg Oral Daily  . LORazepam  0-4 mg Intravenous Q6H   Followed by  . [START ON 09/07/2015] LORazepam  0-4 mg Intravenous Q12H  . multivitamin with minerals  1 tablet Oral Daily  . pantoprazole (PROTONIX) IV  40 mg Intravenous Q12H  . sodium chloride  3 mL Intravenous Q12H  . thiamine  100 mg Oral Daily   Or  . thiamine  100 mg Intravenous Daily   Continuous Infusions: . dextrose 5 % and 0.45% NaCl 1,000 mL infusion      Principal Problem:   Acute upper GI bleed Active Problems:   Hemorrhagic shock   Thrombocytopenia   HTN (hypertension)   Elevated LFTs   Cirrhosis, alcoholic   HCC (hepatocellular carcinoma)   Anemia, blood loss   Lactic acidosis  Hyperkalemia    Time spent: 40 mins    [facility] MD [facility] Pager [phone]. If 7PM-7AM, please contact night-coverage at [email or web address], password [facility] 09/06/2015, 9:21 AM  LOS: 1 day

## 2015-09-06 NOTE — Progress Notes (Signed)
Initial Nutrition Assessment  DOCUMENTATION CODES:   Not applicable  INTERVENTION:  - Continue to advance diet as medically feasible - Will order Boost Breeze TID, each supplement provides 250 kcal and 9 grams of protein - RD will continue to monitor for needs  NUTRITION DIAGNOSIS:   Inadequate protein intake related to other (see comment) (current diet order) as evidenced by other (see comment) (CLD does not meet estimated protein needs).  GOAL:   Patient will meet greater than or equal to 90% of their needs  MONITOR:   Diet advancement, PO intake, Supplement acceptance, Weight trends, Labs, I & O's  REASON FOR ASSESSMENT:   Malnutrition Screening Tool  ASSESSMENT:   59 y.o. male with past medical history of depression and anxiety, hepatitis C, history of hepatocellular carcinoma with a history of alcoholic cirrhosis hasn't drank in over 5 years, essential hypertension that comes in for coffee ground hematemesis with blood clots that started the day prior to admission. He relates he has vomited blood about 4-6 times overnight. He relates he slept with a bucket next to him and in the middle of the night he also woke up vomiting blood. When he woke up this morning he felt palpitations and lightheaded upon standing, with generalized weakness and shortness of breath. He related he has not consumed any alcohol or any NSAIDs.  Pt seen for MST. BMI indicates normal weight status. Pt was NPO until 0921 this AM when diet was advanced to CLD; pt has not yet received a tray since diet advancement.  He reports that he strictly follows a 1500 mg/day Na restricted diet at home and that he has a home health aid who also assists him with knowing what he can and cannot eat on this diet. He typically eats 1 large meal/day and may snack 1-2 times per day as well as drinking iced tea throughout the day. Pt does not have teeth and reports that he eats both with and without his dentures and tolerates  chewing all foods well. He denies swallowing difficulty.   Pt states that nausea and vomiting began the night prior to admission and that because of this he is currently not feeling hungry.   Mild muscle wasting noted to upper and lower body. He states UBW is 150-160 lbs although he sometimes weighs below 150 lbs. Per chart review, current weight consistent with reported UBW.   Will order Boost Breeze to supplement current diet order and monitor for needs with diet advancement. Not meeting needs at this time. Medications reviewed. Labs reviewed; Cl: 117 mmol/L, BUN elevated, Ca: 8.4 mg/dL.    Diet Order:  Diet clear liquid Room service appropriate?: Yes; Fluid consistency:: Thin  Skin:  Reviewed, no issues  Last BM:  PTA  Height:   Ht Readings from Last 1 Encounters:  09/05/15 5\' 8"  (1.727 m)    Weight:   Wt Readings from Last 1 Encounters:  09/05/15 150 lb 2.1 oz (68.1 kg)    Ideal Body Weight:  70 kg (kg)  BMI:  Body mass index is 22.83 kg/(m^2).  Estimated Nutritional Needs:   Kcal:  1500-1700  Protein:  65-75 grams  Fluid:  2.2 L/day  EDUCATION NEEDS:   No education needs identified at this time      Jarome Matin, RD, LDN Inpatient Clinical Dietitian Pager # [phone] After hours/weekend pager # [phone]

## 2015-09-06 NOTE — Progress Notes (Signed)
[facility] Progress Note  Subjective: No signs of further bleeding  Objective: Vital signs in last 24 hours: Temp:  [97.3 F (36.3 C)-99.5 F (37.5 C)] 98.1 F (36.7 C) (09/21 0800) Pulse Rate:  [61-100] 61 (09/21 0700) Resp:  [13-32] 15 (09/21 0700) BP: (91-149)/(46-92) 100/54 mmHg (09/21 0700) SpO2:  [94 %-100 %] 94 % (09/21 0700) Weight:  [68.1 kg (150 lb 2.1 oz)] 68.1 kg (150 lb 2.1 oz) (09/20 1830) Weight change:    PE:  Heart regular rhythm  Lungs clear  Abdomen soft nontender  Lab Results: Results for orders placed or performed during the hospital encounter of 09/05/15 (from the past 24 hour(s))  Prepare RBC     Status: None   Collection Time: 09/05/15  1:00 PM  Result Value Ref Range   Order Confirmation ORDER PROCESSED BY BLOOD BANK   I-Stat CG4 Lactic Acid, ED     Status: Abnormal   Collection Time: 09/05/15  1:36 PM  Result Value Ref Range   Lactic Acid, Venous 2.63 (HH) 0.5 - 2.0 mmol/L   Comment NOTIFIED PHYSICIAN   MRSA PCR Screening     Status: None   Collection Time: 09/05/15  6:50 PM  Result Value Ref Range   MRSA by PCR NEGATIVE NEGATIVE  CBC     Status: Abnormal   Collection Time: 09/06/15  2:12 AM  Result Value Ref Range   WBC 7.4 4.0 - 10.5 K/uL   RBC 2.68 (L) 4.22 - 5.81 MIL/uL   Hemoglobin 8.7 (L) 13.0 - 17.0 g/dL   HCT 25.4 (L) 39.0 - 52.0 %   MCV 94.8 78.0 - 100.0 fL   MCH 32.5 26.0 - 34.0 pg   MCHC 34.3 30.0 - 36.0 g/dL   RDW 14.7 11.5 - 15.5 %   Platelets 56 (L) 150 - 400 K/uL  CBC     Status: Abnormal   Collection Time: 09/06/15  6:10 AM  Result Value Ref Range   WBC 7.6 4.0 - 10.5 K/uL   RBC 2.78 (L) 4.22 - 5.81 MIL/uL   Hemoglobin 8.9 (L) 13.0 - 17.0 g/dL   HCT 26.0 (L) 39.0 - 52.0 %   MCV 93.5 78.0 - 100.0 fL   MCH 32.0 26.0 - 34.0 pg   MCHC 34.2 30.0 - 36.0 g/dL   RDW 15.3 11.5 - 15.5 %   Platelets 58 (L) 150 - 400 K/uL  Comprehensive metabolic panel     Status: Abnormal   Collection Time: 09/06/15  6:10 AM   Result Value Ref Range   Sodium 142 135 - 145 mmol/L   Potassium 4.2 3.5 - 5.1 mmol/L   Chloride 117 (H) 101 - 111 mmol/L   CO2 21 (L) 22 - 32 mmol/L   Glucose, Bld 86 65 - 99 mg/dL   BUN 44 (H) 6 - 20 mg/dL   Creatinine, Ser 1.02 0.61 - 1.24 mg/dL   Calcium 8.4 (L) 8.9 - 10.3 mg/dL   Total Protein 4.9 (L) 6.5 - 8.1 g/dL   Albumin 2.8 (L) 3.5 - 5.0 g/dL   AST 34 15 - 41 U/L   ALT 24 17 - 63 U/L   Alkaline Phosphatase 64 38 - 126 U/L   Total Bilirubin 1.1 0.3 - 1.2 mg/dL   GFR calc non Af Amer >60 >60 mL/min   GFR calc Af Amer >60 >60 mL/min   Anion gap 4 (L) 5 - 15    Studies/Results: No results found.    Assessment: Upper  GI bleed secondary to ulceration at the esophagogastric junction  History of cirrhosis of the liver. No signs of esophageal or gastric varices on the EGD.  Hepatitis C  History of hepatocellular carcinoma which was treated and according to the patient eradicated in the past  Plan:   Continue supportive care. Advance diet. If no further bleeding he can probably go home tomorrow. Would avoid aspirin and NSAIDs.    SAM F Dominyk Law 09/06/2015, 11:01 AM  Pager: [phone] If no answer or after 5 PM call [phone]

## 2015-09-07 LAB — CBC
HCT: 23.3 % — ABNORMAL LOW (ref 39.0–52.0)
Hemoglobin: 7.9 g/dL — ABNORMAL LOW (ref 13.0–17.0)
MCH: 32.6 pg (ref 26.0–34.0)
MCHC: 33.9 g/dL (ref 30.0–36.0)
MCV: 96.3 fL (ref 78.0–100.0)
Platelets: 52 10*3/uL — ABNORMAL LOW (ref 150–400)
RBC: 2.42 MIL/uL — ABNORMAL LOW (ref 4.22–5.81)
RDW: 15.5 % (ref 11.5–15.5)
WBC: 3.9 10*3/uL — ABNORMAL LOW (ref 4.0–10.5)

## 2015-09-07 LAB — BASIC METABOLIC PANEL
Anion gap: 5 (ref 5–15)
BUN: 24 mg/dL — ABNORMAL HIGH (ref 6–20)
CO2: 21 mmol/L — ABNORMAL LOW (ref 22–32)
Calcium: 8.4 mg/dL — ABNORMAL LOW (ref 8.9–10.3)
Chloride: 115 mmol/L — ABNORMAL HIGH (ref 101–111)
Creatinine, Ser: 1.01 mg/dL (ref 0.61–1.24)
GFR calc Af Amer: >60 mL/min (ref >60)
GFR calc non Af Amer: >60 mL/min (ref >60)
Glucose, Bld: 113 mg/dL — ABNORMAL HIGH (ref 65–99)
Potassium: 4.1 mmol/L (ref 3.5–5.1)
Sodium: 141 mmol/L (ref 135–145)

## 2015-09-07 LAB — HEMOGLOBIN AND HEMATOCRIT, BLOOD
HCT: 25 % — ABNORMAL LOW (ref 39.0–52.0)
Hemoglobin: 8.4 g/dL — ABNORMAL LOW (ref 13.0–17.0)

## 2015-09-07 MED ORDER — PANTOPRAZOLE SODIUM 40 MG PO TBEC
40.0000 mg | DELAYED_RELEASE_TABLET | Freq: Two times a day (BID) | ORAL | Status: DC
  Administered 2015-09-07 – 2015-09-09 (×5): 40 mg via ORAL
  Filled 2015-09-07 (×5): qty 1

## 2015-09-07 NOTE — Evaluation (Signed)
Physical Therapy One Time Evaluation Patient Details Name: Jeffrey Snow MRN: [id number] DOB: 1956-11-02 Today's Date: 09/07/2015   History of Present Illness  59 y.o. male with past medical history of depression and anxiety, hepatitis C, hepatocellular carcinoma, alcoholic cirrhosis, essential hypertension that comes in for coffee ground hematemesis with blood clots that started the day prior to admission and admitted for acute upper GI bleed and hemorrhagic shock.  Clinical Impression  Patient evaluated by Physical Therapy with no further acute PT needs identified. All education has been completed and the patient has no further questions. Pt mobilizing well and reports he has RW at home if needed.  Pt denies denies and SPO2 and HR WNL upon ambulating.  No further follow-up Physical Therapy or equipment needs. PT is signing off. Thank you for this referral.     Follow Up Recommendations No PT follow up    Equipment Recommendations  None recommended by PT    Recommendations for Other Services       Precautions / Restrictions Precautions Precautions: Fall      Mobility  Bed Mobility Overal bed mobility: Modified Independent                Transfers Overall transfer level: Modified independent                  Ambulation/Gait Ambulation/Gait assistance: Supervision;Modified independent (Device/Increase time) Ambulation Distance (Feet): 200 Feet Assistive device: None Gait Pattern/deviations: Antalgic     General Gait Details: slightly antalgic gait however pt reports no pain currently, hx of R ankle pain however will use RW at home if too painful, vitals WNL, denies dizziness  Stairs            Wheelchair Mobility    Modified Rankin (Stroke Patients Only)       Balance Overall balance assessment: No apparent balance deficits (not formally assessed)                                           Pertinent Vitals/Pain Pain  Assessment: No/denies pain    Home Living Family/patient expects to be discharged to:: Private residence Living Arrangements: Alone   Type of Home: Apartment       Home Layout: One level Home Equipment: Environmental consultant - 2 wheels      Prior Function Level of Independence: Independent with assistive device(s)               Hand Dominance        Extremity/Trunk Assessment               Lower Extremity Assessment: Overall WFL for tasks assessed;RLE deficits/detail RLE Deficits / Details: pt reports chronic intermittent R ankle pain however states MD cannot find cause, reports sometimes difficulty with moving toes and ankle however R LE appears WFL today       Communication   Communication: No difficulties  Cognition Arousal/Alertness: Awake/alert Behavior During Therapy: WFL for tasks assessed/performed Overall Cognitive Status: Within Functional Limits for tasks assessed                      General Comments      Exercises        Assessment/Plan    PT Assessment Patent does not need any further PT services  PT Diagnosis     PT Problem List    PT  Treatment Interventions     PT Goals (Current goals can be found in the Care Plan section) Acute Rehab PT Goals PT Goal Formulation: All assessment and education complete, DC therapy    Frequency     Barriers to discharge        Co-evaluation               End of Session   Activity Tolerance: Patient tolerated treatment well Patient left: in bed;with call bell/phone within reach (sitting EOB, OT entered room )           Time: 6861-6837 PT Time Calculation (min) (ACUTE ONLY): 15 min   Charges:   PT Evaluation $Initial PT Evaluation Tier I: 1 Procedure     PT G Codes:        Deshanta Lady,KATHrine E 09/07/2015, 2:47 PM Carmelia Bake, PT, DPT 09/07/2015 Pager: [phone]

## 2015-09-07 NOTE — Progress Notes (Signed)
[facility] Progress Note  Subjective: Slight drop in hemoglobin overnight. This could be dilutional or lab variation. No signs of active GI bleeding. Patient has no specific complaints.  Objective: Vital signs in last 24 hours: Temp:  [97.2 F (36.2 C)-98.3 F (36.8 C)] 98.3 F (36.8 C) (09/22 0722) Pulse Rate:  [56-93] 56 (09/22 0800) Resp:  [12-18] 14 (09/22 0800) BP: (88-139)/(44-69) 91/48 mmHg (09/22 0800) SpO2:  [96 %-100 %] 96 % (09/22 0800) Weight:  [67.6 kg (149 lb 0.5 oz)] 67.6 kg (149 lb 0.5 oz) (09/22 0400) Weight change: -0.5 kg (-1 lb 1.6 oz)   PE:  Heart regular rhythm no murmurs  Lungs clear  Abdomen: Soft and nontender  Lab Results: Results for orders placed or performed during the hospital encounter of 09/05/15 (from the past 24 hour(s))  CBC     Status: Abnormal   Collection Time: 09/06/15  4:17 PM  Result Value Ref Range   WBC 5.0 4.0 - 10.5 K/uL   RBC 2.55 (L) 4.22 - 5.81 MIL/uL   Hemoglobin 8.2 (L) 13.0 - 17.0 g/dL   HCT 24.4 (L) 39.0 - 52.0 %   MCV 95.7 78.0 - 100.0 fL   MCH 32.2 26.0 - 34.0 pg   MCHC 33.6 30.0 - 36.0 g/dL   RDW 15.7 (H) 11.5 - 15.5 %   Platelets 55 (L) 150 - 400 K/uL  CBC     Status: Abnormal   Collection Time: 09/07/15  4:00 AM  Result Value Ref Range   WBC 3.9 (L) 4.0 - 10.5 K/uL   RBC 2.42 (L) 4.22 - 5.81 MIL/uL   Hemoglobin 7.9 (L) 13.0 - 17.0 g/dL   HCT 23.3 (L) 39.0 - 52.0 %   MCV 96.3 78.0 - 100.0 fL   MCH 32.6 26.0 - 34.0 pg   MCHC 33.9 30.0 - 36.0 g/dL   RDW 15.5 11.5 - 15.5 %   Platelets 52 (L) 150 - 400 K/uL  Basic metabolic panel     Status: Abnormal   Collection Time: 09/07/15  4:00 AM  Result Value Ref Range   Sodium 141 135 - 145 mmol/L   Potassium 4.1 3.5 - 5.1 mmol/L   Chloride 115 (H) 101 - 111 mmol/L   CO2 21 (L) 22 - 32 mmol/L   Glucose, Bld 113 (H) 65 - 99 mg/dL   BUN 24 (H) 6 - 20 mg/dL   Creatinine, Ser 1.01 0.61 - 1.24 mg/dL   Calcium 8.4 (L) 8.9 - 10.3 mg/dL   GFR calc non Af  Amer >60 >60 mL/min   GFR calc Af Amer >60 >60 mL/min   Anion gap 5 5 - 15    Studies/Results: No results found.    Assessment: Patient is currently clinically stable. I think he can be transferred out of the unit and perhaps discharge later today or in the morning if hemoglobin remains stable.  Plan:   Continue PPI therapy.    SAM F Karna Abed 09/07/2015, 11:09 AM  Pager: [phone] If no answer or after 5 PM call [phone]

## 2015-09-07 NOTE — Evaluation (Signed)
Occupational Therapy Evaluation Patient Details Name: Jeffrey Snow MRN: [id number] DOB: November 10, 1956 Today's Date: 09/07/2015    History of Present Illness 59 y.o. male with past medical history of depression and anxiety, hepatitis C, hepatocellular carcinoma, alcoholic cirrhosis, essential hypertension that comes in for coffee ground hematemesis with blood clots that started the day prior to admission and admitted for acute upper GI bleed and hemorrhagic shock.   Clinical Impression   Pt was admitted for the above.  At baseline, he is independent with adls and IADLs.  At time of evaluation, pt was mostly supervision, assisting with lines and leads; however, he was unsteady when attempting tub transfer, even with use of grab bar. Will follow in acute to further educate on tub bench as possibility vs. Sponge bathing for safety.  Gathering adls items at mod I level is other focus in acute.      Follow Up Recommendations  No OT follow up    Equipment Recommendations   (possibly tub transfer bench:  to be further assessed)    Recommendations for Other Services       Precautions / Restrictions Precautions Precautions: Fall Restrictions Weight Bearing Restrictions: No      Mobility Bed Mobility Overal bed mobility: Independent                Transfers Overall transfer level: Modified independent Equipment used: None                  Balance Overall balance assessment: Pt unsteady only when stepping over ledge to simulate tub: steadying assistance for this                                           ADL Overall ADL's : Needs assistance/impaired                         Toilet Transfer: Supervision/safety;Ambulation       Tub/ Shower Transfer: Minimal assistance;Tub transfer;Ambulation     General ADL Comments: Practiced simulated tub transfer 3x's--pt unsteady especially when stepping over ledge with LLE--needed steadying twice.   Pt has a grab bar at home.  He states doesn't have anyone who can come over when he showers.  Spoke to him about either sponge bathing at sink initially until he feels he has his baseline strength vs. tub bench.  I had a photo of a tub bench, but he had difficulty conceptualizing this.  Will try to bring one by tomorrow to show him and make sure he would have the space for it in his home.  Pt was not unsteady walking around room and into bathroom:  He needs overall supervision for adls to gather items (mostly for lines).  Pt reports he doesn't really have much of a support system.  He has a brother who will pick him up, but this brother is also busy helping another brother who has medical issues.  Pt spoke about needing to clean up around house from bleeding prior to admission. Encouraged him to have his brother assist him     Vision     Perception     Praxis      Pertinent Vitals/Pain Pain Assessment: No/denies pain     Hand Dominance     Extremity/Trunk Assessment Upper Extremity Assessment Upper Extremity Assessment: Overall WFL for tasks assessed (grossly 4/5 strength)  Communication Communication Communication: No difficulties   Cognition Arousal/Alertness: Awake/alert Behavior During Therapy: WFL for tasks assessed/performed Overall Cognitive Status: Within Functional Limits for tasks assessed (pt very talkative:  redirection for tasks)                     General Comments       Exercises       Shoulder Instructions      Home Living Family/patient expects to be discharged to:: Private residence Living Arrangements: Alone   Type of Home: Apartment       Home Layout: One level     Bathroom Shower/Tub: Teacher, early years/pre: Handicapped height     Home Equipment: Environmental consultant - 2 wheels          Prior Functioning/Environment Level of Independence: Independent with assistive device(s)        Comments: pt drives and gets  groceries    OT Diagnosis: Generalized weakness   OT Problem List: Impaired balance (sitting and/or standing);Pain;Decreased knowledge of use of DME or AE   OT Treatment/Interventions: Self-care/ADL training;DME and/or AE instruction;Patient/family education    OT Goals(Current goals can be found in the care plan section) Acute Rehab OT Goals Patient Stated Goal: none stated:  agreeable to OT  OT Goal Formulation: With patient Time For Goal Achievement: 09/14/15 Potential to Achieve Goals: Good ADL Goals Pt Will Perform Tub/Shower Transfer: Tub transfer;ambulating;tub bench;with modified independence Additional ADL Goal #1: pt will gather clothes at mod I level  OT Frequency: Min 2X/week   Barriers to D/C:            Co-evaluation              End of Session    Activity Tolerance: Patient tolerated treatment well Patient left: in bed;with call bell/phone within reach   Time: 1337-1401 OT Time Calculation (min): 24 min Charges:  OT General Charges $OT Visit: 1 Procedure OT Evaluation $Initial OT Evaluation Tier I: 1 Procedure OT Treatments $Self Care/Home Management : 8-22 mins G-Codes:    Paloma Grange 28-Sep-2015, 3:11 PM  Lesle Chris, OTR/L [phone] 2015-09-28

## 2015-09-07 NOTE — Progress Notes (Signed)
[facility] PROGRESS NOTE  Jeffrey Snow AOZ:[id number] DOB: 06/30/1956 DOA: 09/05/2015 PCP: [name],[name], MD  Assessment/Plan: #1 acute upper GI bleed  Likely secondary to ulceration at GE junction. Patient denies any further hematemesis. Patient states that his stools are dark. Status post upper endoscopy 09/05/2015 per [name] which showed small ulceration at the GE junction. Surrounding GE junction mucosa very erythematous with no active bleed. Moderate hiatal hernia. Erythematous gastric mucosa. Moderate blackish colored liquid in the stomach with no evidence of active bleed. Patchy erythema in the duodenal bulb. Patient is status post 2 units packed red blood cells(09/05/2015). Hemoglobin currently at  7.9 today from 8.9 yesterday. Continue PPI. Avoid aspirin and NSAIDs. Advance to  Regular diet. GI following and appreciate input and recommendations.  #2 hemorrhagic shock Secondary to problem #1. Status post 2 units pack red blood cells. Blood pressure improved. Antihypertensives on hold. Continue volume resuscitation with IV fluids. PCCM following. Follow.  #3 acute blood loss anemia Secondary to problem #1. Status post 2 units pack red blood cells 09/05/2015. Hemoglobin currently  At 7.9 from 8.2 from 8.9.  Patient states still with some dark stools.  Possible dilutional effect as well.  Advance diet to a regular diet.  If tolerates breakfast  Will saline lock IV fluids.  Transfusion threshold hemoglobin less than 7.  #4 hyperkalemia Likely secondary to volume depletion in the setting of ACE inhibitor and Aldactone. ACE inhibitor and Aldactone on hold. Hyperkalemia has resolved. Follow.  #5 hypertension Antihypertensives medications on hold. Follow.   #6 elevated LFTs/alcoholic cirrhosis Patient denies any alcohol RESUMPTION IN OVER 5 YEARS. LFTs have normalized. Continue thiamine and folic acid. Continue the Ativan withdrawal protocol.  #7 hepatocellular  carcinoma Status post transarterial chemotherapy and percutaneous thermal ablation. Outpatient follow-up.  #8 chronic thrombocytopenia Stable.  #9 prehospitalization UTI Continue ciprofloxacin for 2 more days as patient states had 3 days left of antibiotics to take  prior to admission.  #10 prophylaxis PPI for GI prophylaxis. SCDs for DVT prophylaxis.    Code Status: Full Family Communication: Updated patient. No family at bedside. Disposition Plan: Remain the step down unit.   Consultants:  Gastroenterology: [name] 09/05/2015  PCCM: Dr [name] 09/05/2015  Procedures:  Upper endoscopy: [name] 09/05/2015  2 units pack red blood cells 09/05/2015  Antibiotics:  None  HPI/Subjective: Patient denies any further hematemesis. Patient states  Bowel movements are dark. Patient denies any dizziness. No chest pain. No shortness of breath.  Objective: Filed Vitals:   09/07/15 0800  BP: 91/48  Pulse: 56  Temp:   Resp: 14    Intake/Output Summary (Last 24 hours) at 09/07/15 0931 Last data filed at 09/07/15 0656  Gross per 24 hour  Intake   1495 ml  Output   1550 ml  Net    -55 ml   Filed Weights   09/05/15 1830 09/07/15 0400  Weight: 68.1 kg (150 lb 2.1 oz) 67.6 kg (149 lb 0.5 oz)    Exam:   General:  NAD  Cardiovascular: RRR  Respiratory: CTAB  Abdomen: Soft, nontender, nondistended, positive bowel sounds.  Musculoskeletal: No clubbing cyanosis or edema.  Data Reviewed: Basic Metabolic Panel:  Recent Labs Lab 08/31/15 1220 09/05/15 0746 09/05/15 1019 09/06/15 0610 09/07/15 0400  NA 137 135 140 142 141  K 4.2 6.2* 5.7* 4.2 4.1  CL 104 105 111 117* 115*  CO2 23 18*  --  21* 21*  GLUCOSE 74 163* 127* 86 113*  BUN  11 50* 45* 44* 24*  CREATININE 0.96 1.31* 1.20 1.02 1.01  CALCIUM 8.7 8.5*  --  8.4* 8.4*   Liver Function Tests:  Recent Labs Lab 08/31/15 1220 09/05/15 0746 09/06/15 0610  AST 25 47* 34  ALT 20 26 24   ALKPHOS 108 86  64  BILITOT 0.6 1.0 1.1  PROT 6.3 5.1* 4.9*  ALBUMIN 3.7 2.7* 2.8*   No results for input(s): LIPASE, AMYLASE in the last 168 hours.  Recent Labs Lab 09/05/15 0746  AMMONIA 106*   CBC:  Recent Labs Lab 09/05/15 0746 09/05/15 1019 09/06/15 0212 09/06/15 0610 09/06/15 1617 09/07/15 0400  WBC 10.2  --  7.4 7.6 5.0 3.9*  NEUTROABS 8.4*  --   --   --   --   --   HGB 7.7* 6.1* 8.7* 8.9* 8.2* 7.9*  HCT 22.9* 18.0* 25.4* 26.0* 24.4* 23.3*  MCV 98.7  --  94.8 93.5 95.7 96.3  PLT 106*  --  56* 58* 55* 52*   Cardiac Enzymes: No results for input(s): CKTOTAL, CKMB, CKMBINDEX, TROPONINI in the last 168 hours. BNP (last 3 results) No results for input(s): BNP in the last 8760 hours.  ProBNP (last 3 results) No results for input(s): PROBNP in the last 8760 hours.  CBG: No results for input(s): GLUCAP in the last 168 hours.  Recent Results (from the past 240 hour(s))  Urine culture     Status: None   Collection Time: 08/31/15  4:25 PM  Result Value Ref Range Status   Culture STAPHYLOCOCCUS SPECIES (COAGULASE NEGATIVE)  Final   Colony Count >=100,000 COLONIES/ML  Final   Organism ID, Bacteria STAPHYLOCOCCUS SPECIES (COAGULASE NEGATIVE)  Final    Comment: Rifampin and Gentamicin should not be used as single drugs for treatment of Staph infections.       Susceptibility   Staphylococcus species (coagulase negative) -  (no method available)    OXACILLIN <=0.25 Sensitive     CEFAZOLIN  Sensitive     GENTAMICIN <=0.5 Sensitive     CIPROFLOXACIN <=0.5 Sensitive     LEVOFLOXACIN 0.25 Sensitive     NITROFURANTOIN <=16 Sensitive     TRIMETH/SULFA 160 Resistant     VANCOMYCIN 1 Sensitive     RIFAMPIN <=0.5 Sensitive     TETRACYCLINE 2 Sensitive   MRSA PCR Screening     Status: None   Collection Time: 09/05/15  6:50 PM  Result Value Ref Range Status   MRSA by PCR NEGATIVE NEGATIVE Final    Comment:        The GeneXpert MRSA Assay (FDA approved for NASAL specimens only), is one  component of a comprehensive MRSA colonization surveillance program. It is not intended to diagnose MRSA infection nor to guide or monitor treatment for MRSA infections.      Studies: No results found.  Scheduled Meds: . ALPRAZolam  0.5 mg Oral BID  . ciprofloxacin  500 mg Oral BID  . feeding supplement  1 Container Oral TID BM  . FLUoxetine  40 mg Oral q morning - 62M  . folic acid  1 mg Oral Daily  . LORazepam  0-4 mg Intravenous Q6H   Followed by  . [START ON 09/08/2015] LORazepam  0-4 mg Intravenous Q12H  . multivitamin with minerals  1 tablet Oral Daily  . pantoprazole (PROTONIX) IV  40 mg Intravenous Q12H  . sodium chloride  3 mL Intravenous Q12H  . thiamine  100 mg Oral Daily   Or  . thiamine  100 mg Intravenous Daily   Continuous Infusions: . dextrose 5 % and 0.45% NaCl 75 mL/hr at 09/06/15 2221    Principal Problem:   Acute upper GI bleed Active Problems:   Hemorrhagic shock   Thrombocytopenia   HTN (hypertension)   Elevated LFTs   Cirrhosis, alcoholic   HCC (hepatocellular carcinoma)   Anemia, blood loss   Lactic acidosis   Hyperkalemia    Time spent: 40 mins    [facility] MD [facility] Pager [phone]. If 7PM-7AM, please contact night-coverage at [email or web address], password [facility] 09/07/2015, 9:31 AM  LOS: 2 days

## 2015-09-07 NOTE — Progress Notes (Signed)
Patient transferred from stepdown. Report received from [place], [place]. Agree with previous nurse assessment.

## 2015-09-07 NOTE — Care Management Note (Signed)
Case Management Note  Patient Details  Name: Jeffrey Snow MRN: [id number] Date of Birth: 1956/01/20  Subjective/Objective:                 Gi bleed and hypotension   Action/Plan:Date:  Sept. 22, 2016 U.R. performed for needs and level of care. Will continue to follow for Case Management needs.  Velva Harman, RN, BSN, [place]   [phone]   Expected Discharge Date:   (UNKNOWN)               Expected Discharge Plan:  Home/Self Care  In-House Referral:  NA  Discharge planning Services  CM Consult  Post Acute Care Choice:  NA Choice offered to:  NA  DME Arranged:    DME Agency:     HH Arranged:    HH Agency:     Status of Service:  In process, will continue to follow  Medicare Important Message Given:    Date Medicare IM Given:    Medicare IM give by:    Date Additional Medicare IM Given:    Additional Medicare Important Message give by:     If discussed at [place] of Stay Meetings, dates discussed:    Additional Comments:  Leeroy Cha, RN 09/07/2015, 11:06 AM

## 2015-09-08 LAB — CBC
HCT: 22.1 % — ABNORMAL LOW (ref 39.0–52.0)
Hemoglobin: 7.4 g/dL — ABNORMAL LOW (ref 13.0–17.0)
MCH: 32.5 pg (ref 26.0–34.0)
MCHC: 33.5 g/dL (ref 30.0–36.0)
MCV: 96.9 fL (ref 78.0–100.0)
Platelets: 45 10*3/uL — ABNORMAL LOW (ref 150–400)
RBC: 2.28 MIL/uL — ABNORMAL LOW (ref 4.22–5.81)
RDW: 15 % (ref 11.5–15.5)
WBC: 3.2 10*3/uL — ABNORMAL LOW (ref 4.0–10.5)

## 2015-09-08 LAB — BASIC METABOLIC PANEL
Anion gap: 6 (ref 5–15)
BUN: 11 mg/dL (ref 6–20)
CO2: 22 mmol/L (ref 22–32)
Calcium: 8 mg/dL — ABNORMAL LOW (ref 8.9–10.3)
Chloride: 113 mmol/L — ABNORMAL HIGH (ref 101–111)
Creatinine, Ser: 0.96 mg/dL (ref 0.61–1.24)
GFR calc Af Amer: >60 mL/min (ref >60)
GFR calc non Af Amer: >60 mL/min (ref >60)
Glucose, Bld: 119 mg/dL — ABNORMAL HIGH (ref 65–99)
Potassium: 3.6 mmol/L (ref 3.5–5.1)
Sodium: 141 mmol/L (ref 135–145)

## 2015-09-08 LAB — HEMOGLOBIN AND HEMATOCRIT, BLOOD
HCT: 23.3 % — ABNORMAL LOW (ref 39.0–52.0)
Hemoglobin: 7.8 g/dL — ABNORMAL LOW (ref 13.0–17.0)

## 2015-09-08 MED ORDER — BISACODYL 10 MG RE SUPP
10.0000 mg | Freq: Every day | RECTAL | Status: DC | PRN
  Administered 2015-09-09: 10 mg via RECTAL
  Filled 2015-09-08 (×2): qty 1

## 2015-09-08 NOTE — Progress Notes (Signed)
[facility] PROGRESS NOTE  Jeffrey Snow YSA:[id number] DOB: March 05, 1956 DOA: 09/05/2015 PCP: [name],[name], MD  Assessment/Plan: #1 acute upper GI bleed  Likely secondary to ulceration at GE junction. Patient denies any further hematemesis. Patient states that his stools are dark. Status post upper endoscopy 09/05/2015 per [name] which showed small ulceration at the GE junction. Surrounding GE junction mucosa very erythematous with no active bleed. Moderate hiatal hernia. Erythematous gastric mucosa. Moderate blackish colored liquid in the stomach with no evidence of active bleed. Patchy erythema in the duodenal bulb. Patient is status post 2 units packed red blood cells(09/05/2015). Hemoglobin currently at  7.4 from 7.9 from 8.9 yesterday. Continue PPI. Avoid aspirin and NSAIDs. Continue current diet. Saline lock IV fluids. GI following and appreciate input and recommendations.  #2 hemorrhagic shock Secondary to problem #1. Status post 2 units pack red blood cells. Blood pressure improved. Antihypertensives on hold. Saline lock IV fluids. Follow.  #3 acute blood loss anemia Secondary to problem #1. Status post 2 units pack red blood cells 09/05/2015. Hemoglobin currently  At 7.4 from 7.9 from 8.2 from 8.9.  Patient states still with some dark stools.  Possible dilutional effect as well.  Advance diet to a regular diet.  If tolerates breakfast  Will saline lock IV fluids.  Transfusion threshold hemoglobin less than 7.  #4 hyperkalemia Likely secondary to volume depletion in the setting of ACE inhibitor and Aldactone. ACE inhibitor and Aldactone on hold. Hyperkalemia has resolved. Follow.  #5 hypertension Antihypertensives medications on hold. Follow.   #6 elevated LFTs/alcoholic cirrhosis Patient denies any alcohol RESUMPTION IN OVER 5 YEARS. LFTs have normalized. Continue thiamine and folic acid. Continue the Ativan withdrawal protocol.  #7 hepatocellular carcinoma Status  post transarterial chemotherapy and percutaneous thermal ablation. Outpatient follow-up.  #8 chronic thrombocytopenia Stable.  #9 prehospitalization UTI Continue ciprofloxacin for 1 more days as patient states had 3 days left of antibiotics to take  prior to admission.  #10 prophylaxis PPI for GI prophylaxis. SCDs for DVT prophylaxis.    Code Status: Full Family Communication: Updated patient. No family at bedside. Disposition Plan: Hopefully home tomorrow.   Consultants:  Gastroenterology: [name] 09/05/2015  PCCM: Dr [name] 09/05/2015  Procedures:  Upper endoscopy: [name] 09/05/2015  2 units pack red blood cells 09/05/2015  Antibiotics:  None  HPI/Subjective: Patient denies any further hematemesis. Patient denies bloody stools.  Objective: Filed Vitals:   09/08/15 1407  BP: 121/57  Pulse: 66  Temp: 98.2 F (36.8 C)  Resp: 16    Intake/Output Summary (Last 24 hours) at 09/08/15 1908 Last data filed at 09/08/15 1845  Gross per 24 hour  Intake 1092.5 ml  Output    525 ml  Net  567.5 ml   Filed Weights   09/05/15 1830 09/07/15 0400 09/08/15 0502  Weight: 68.1 kg (150 lb 2.1 oz) 67.6 kg (149 lb 0.5 oz) 67.586 kg (149 lb)    Exam:   General:  NAD  Cardiovascular: RRR  Respiratory: CTAB  Abdomen: Soft, nontender, nondistended, positive bowel sounds.  Musculoskeletal: No clubbing cyanosis or edema.  Data Reviewed: Basic Metabolic Panel:  Recent Labs Lab 09/05/15 0746 09/05/15 1019 09/06/15 0610 09/07/15 0400 09/08/15 0540  NA 135 140 142 141 141  K 6.2* 5.7* 4.2 4.1 3.6  CL 105 111 117* 115* 113*  CO2 18*  --  21* 21* 22  GLUCOSE 163* 127* 86 113* 119*  BUN 50* 45* 44* 24* 11  CREATININE 1.31* 1.20  1.02 1.01 0.96  CALCIUM 8.5*  --  8.4* 8.4* 8.0*   Liver Function Tests:  Recent Labs Lab 09/05/15 0746 09/06/15 0610  AST 47* 34  ALT 26 24  ALKPHOS 86 64  BILITOT 1.0 1.1  PROT 5.1* 4.9*  ALBUMIN 2.7* 2.8*   No  results for input(s): LIPASE, AMYLASE in the last 168 hours.  Recent Labs Lab 09/05/15 0746  AMMONIA 106*   CBC:  Recent Labs Lab 09/05/15 0746  09/06/15 0212 09/06/15 0610 09/06/15 1617 09/07/15 0400 09/07/15 1430 09/08/15 0540 09/08/15 1432  WBC 10.2  --  7.4 7.6 5.0 3.9*  --  3.2*  --   NEUTROABS 8.4*  --   --   --   --   --   --   --   --   HGB 7.7*  < > 8.7* 8.9* 8.2* 7.9* 8.4* 7.4* 7.8*  HCT 22.9*  < > 25.4* 26.0* 24.4* 23.3* 25.0* 22.1* 23.3*  MCV 98.7  --  94.8 93.5 95.7 96.3  --  96.9  --   PLT 106*  --  56* 58* 55* 52*  --  45*  --   < > = values in this interval not displayed. Cardiac Enzymes: No results for input(s): CKTOTAL, CKMB, CKMBINDEX, TROPONINI in the last 168 hours. BNP (last 3 results) No results for input(s): BNP in the last 8760 hours.  ProBNP (last 3 results) No results for input(s): PROBNP in the last 8760 hours.  CBG: No results for input(s): GLUCAP in the last 168 hours.  Recent Results (from the past 240 hour(s))  Urine culture     Status: None   Collection Time: 08/31/15  4:25 PM  Result Value Ref Range Status   Culture STAPHYLOCOCCUS SPECIES (COAGULASE NEGATIVE)  Final   Colony Count >=100,000 COLONIES/ML  Final   Organism ID, Bacteria STAPHYLOCOCCUS SPECIES (COAGULASE NEGATIVE)  Final    Comment: Rifampin and Gentamicin should not be used as single drugs for treatment of Staph infections.       Susceptibility   Staphylococcus species (coagulase negative) -  (no method available)    OXACILLIN <=0.25 Sensitive     CEFAZOLIN  Sensitive     GENTAMICIN <=0.5 Sensitive     CIPROFLOXACIN <=0.5 Sensitive     LEVOFLOXACIN 0.25 Sensitive     NITROFURANTOIN <=16 Sensitive     TRIMETH/SULFA 160 Resistant     VANCOMYCIN 1 Sensitive     RIFAMPIN <=0.5 Sensitive     TETRACYCLINE 2 Sensitive   MRSA PCR Screening     Status: None   Collection Time: 09/05/15  6:50 PM  Result Value Ref Range Status   MRSA by PCR NEGATIVE NEGATIVE Final     Comment:        The GeneXpert MRSA Assay (FDA approved for NASAL specimens only), is one component of a comprehensive MRSA colonization surveillance program. It is not intended to diagnose MRSA infection nor to guide or monitor treatment for MRSA infections.      Studies: No results found.  Scheduled Meds: . ALPRAZolam  0.5 mg Oral BID  . ciprofloxacin  500 mg Oral BID  . feeding supplement  1 Container Oral TID BM  . FLUoxetine  40 mg Oral q morning - 35H  . folic acid  1 mg Oral Daily  . LORazepam  0-4 mg Intravenous Q12H  . multivitamin with minerals  1 tablet Oral Daily  . pantoprazole  40 mg Oral BID WC  . sodium  chloride  3 mL Intravenous Q12H  . thiamine  100 mg Oral Daily   Or  . thiamine  100 mg Intravenous Daily   Continuous Infusions:    Principal Problem:   Acute upper GI bleed Active Problems:   Hemorrhagic shock   Thrombocytopenia   HTN (hypertension)   Elevated LFTs   Cirrhosis, alcoholic   HCC (hepatocellular carcinoma)   Anemia, blood loss   Lactic acidosis   Hyperkalemia    Time spent: 40 mins    [facility] MD [facility] Pager [phone]. If 7PM-7AM, please contact night-coverage at [email or web address], password [facility] 09/08/2015, 7:08 PM  LOS: 3 days

## 2015-09-08 NOTE — Progress Notes (Signed)
[facility] Progress Note  Subjective: No obvious signs of GI bleeding. Slight drop in hemoglobin and hematocrit however. Patient states he had a bowel movement today which was small but brown. Denies hematemesis.  Objective: Vital signs in last 24 hours: Temp:  [98 F (36.7 C)-98.2 F (36.8 C)] 98.2 F (36.8 C) (09/23 1407) Pulse Rate:  [60-72] 66 (09/23 1407) Resp:  [16] 16 (09/23 1407) BP: (106-121)/(43-57) 121/57 mmHg (09/23 1407) SpO2:  [98 %-100 %] 100 % (09/23 1407) Weight:  [67.586 kg (149 lb)] 67.586 kg (149 lb) (09/23 0502) Weight change: -0.014 kg (-0.5 oz)   PE:  Alert and oriented  No distress  Heart regular rhythm no murmurs  Lungs clear  Abdomen: Soft and nontender  Lab Results: Results for orders placed or performed during the hospital encounter of 09/05/15 (from the past 24 hour(s))  LAB REPORT - SCANNED     Status: None   Collection Time: 09/07/15  4:56 PM   Narrative   Ordered by an unspecified provider.  CBC     Status: Abnormal   Collection Time: 09/08/15  5:40 AM  Result Value Ref Range   WBC 3.2 (L) 4.0 - 10.5 K/uL   RBC 2.28 (L) 4.22 - 5.81 MIL/uL   Hemoglobin 7.4 (L) 13.0 - 17.0 g/dL   HCT 22.1 (L) 39.0 - 52.0 %   MCV 96.9 78.0 - 100.0 fL   MCH 32.5 26.0 - 34.0 pg   MCHC 33.5 30.0 - 36.0 g/dL   RDW 15.0 11.5 - 15.5 %   Platelets 45 (L) 150 - 400 K/uL  Basic metabolic panel     Status: Abnormal   Collection Time: 09/08/15  5:40 AM  Result Value Ref Range   Sodium 141 135 - 145 mmol/L   Potassium 3.6 3.5 - 5.1 mmol/L   Chloride 113 (H) 101 - 111 mmol/L   CO2 22 22 - 32 mmol/L   Glucose, Bld 119 (H) 65 - 99 mg/dL   BUN 11 6 - 20 mg/dL   Creatinine, Ser 0.96 0.61 - 1.24 mg/dL   Calcium 8.0 (L) 8.9 - 10.3 mg/dL   GFR calc non Af Amer >60 >60 mL/min   GFR calc Af Amer >60 >60 mL/min   Anion gap 6 5 - 15  Hemoglobin and hematocrit, blood     Status: Abnormal   Collection Time: 09/08/15  2:32 PM  Result Value Ref Range   Hemoglobin 7.8 (L) 13.0 - 17.0 g/dL   HCT 23.3 (L) 39.0 - 52.0 %    Studies/Results: No results found.    Assessment: GI bleed. No obvious bleeding this time but slight drop in hemoglobin and hematocrit.  Cirrhosis of liver  Hepatitis C    Plan:   Follow clinically. He does not seem to be in any distress.    Jeffrey Snow 09/08/2015, 4:05 PM  Pager: [phone] If no answer or after 5 PM call [phone] Lab Results  Component Value Date   HGB 7.8* 09/08/2015   HGB 7.4* 09/08/2015   HGB 8.4* 09/07/2015   HGB 14.6 10/12/2012   HCT 23.3* 09/08/2015   HCT 22.1* 09/08/2015   HCT 25.0* 09/07/2015   HCT 42.7 10/12/2012   ALKPHOS 64 09/06/2015   ALKPHOS 86 09/05/2015   ALKPHOS 108 08/31/2015   ALKPHOS 88 10/12/2012   AST 34 09/06/2015   AST 47* 09/05/2015   AST 25 08/31/2015   AST 111* 10/12/2012   ALT 24 09/06/2015   ALT 26  09/05/2015   ALT 20 08/31/2015   ALT 151* 10/12/2012

## 2015-09-08 NOTE — Progress Notes (Signed)
Occupational Therapy Treatment Patient Details Name: Jeffrey Snow MRN: [id number] DOB: October 01, 1956 Today's Date: 09/08/2015    History of present illness 59 y.o. male with past medical history of depression and anxiety, hepatitis C, hepatocellular carcinoma, alcoholic cirrhosis, essential hypertension that comes in for coffee ground hematemesis with blood clots that started the day prior to admission and admitted for acute upper GI bleed and hemorrhagic shock.   OT comments  Further educated on tub bench transfer.  Will try to see pt one more time prior to d/c.  He is not sure he wants a tub bench.  Sponge bathing would be safe and we will further assess if he can safely step into tub  Follow Up Recommendations   (home safety check for bathroom/tub bench fitting if pt wants tub bench)    Equipment Recommendations   Defer tub bench to home safety check:  Pt is not sure he wants this and OT wants to make sure it will fit in bathroom space   Recommendations for Other Services      Precautions / Restrictions Precautions Precautions: Fall Restrictions Weight Bearing Restrictions: No       Mobility Bed Mobility Overal bed mobility: Independent                Transfers Overall transfer level: Independent Equipment used: None                  Balance                               High Level Balance Comments: no LOB when ambulating to bathroom.   ADL                                         General ADL Comments: brought tub bench by and demonstrated for pt.  He did not want to practice this. He said he doesn't want to make any decisions until he goes home. Recommended that pt sponge bathe initially if he doesn't get this due to fall risk when stepping over tub.  Pt ambulated to bathroom and stood to urinate:  supervision/assist for IV.  Pt was not unsteady and did not have LOB.  HgB today is 7.4 and he was asymptomatic with me.   Discussed safety when reaching to retrieve items from floor.  Pt usually keeps shoes by couch and can reach for them in sitting.        Vision                     Perception     Praxis      Cognition   Behavior During Therapy: WFL for tasks assessed/performed Overall Cognitive Status: Within Functional Limits for tasks assessed                       Extremity/Trunk Assessment               Exercises     Shoulder Instructions       General Comments      Pertinent Vitals/ Pain       Pain Assessment: No/denies pain  Home Living  Prior Functioning/Environment              Frequency       Progress Toward Goals  OT Goals(current goals can now be found in the care plan section)  Progress towards OT goals: modified one     Plan   recommend home safety eval, if pt wants to consider tub transfer bench   Co-evaluation                 End of Session     Activity Tolerance Patient tolerated treatment well   Patient Left in bed;with call bell/phone within reach;with bed alarm set (pt initially sat in chair but wanted to go back to bed )   Nurse Communication          Time: 6301-6010 OT Time Calculation (min): 18 min  Charges: OT General Charges $OT Visit: 1 Procedure OT Treatments $Self Care/Home Management : 8-22 mins  Jeffrey Snow 09/08/2015, 11:34 AM  Jeffrey Snow, OTR/L [phone] 09/08/2015

## 2015-09-08 NOTE — Progress Notes (Addendum)
M.D. Contacted regarding patients c/o constipation. Order received soap suds enema to assist with discomfort. Patient is not agreeable to plan of care. Education provided regarding the benefits of enema. Pt request to hold off at this time. Will have Night RN to f/u

## 2015-09-09 LAB — CBC
HCT: 22.6 % — ABNORMAL LOW (ref 39.0–52.0)
Hemoglobin: 7.6 g/dL — ABNORMAL LOW (ref 13.0–17.0)
MCH: 32.5 pg (ref 26.0–34.0)
MCHC: 33.6 g/dL (ref 30.0–36.0)
MCV: 96.6 fL (ref 78.0–100.0)
Platelets: 49 10*3/uL — ABNORMAL LOW (ref 150–400)
RBC: 2.34 MIL/uL — ABNORMAL LOW (ref 4.22–5.81)
RDW: 14.7 % (ref 11.5–15.5)
WBC: 2.9 10*3/uL — ABNORMAL LOW (ref 4.0–10.5)

## 2015-09-09 MED ORDER — PANTOPRAZOLE SODIUM 40 MG PO TBEC: 40.0000 mg | DELAYED_RELEASE_TABLET | Freq: Every day | ORAL | Status: DC

## 2015-09-09 MED ORDER — SPIRONOLACTONE 25 MG PO TABS: 75.0000 mg | ORAL_TABLET | Freq: Every morning | ORAL | Status: DC

## 2015-09-09 MED ORDER — PANTOPRAZOLE SODIUM 40 MG PO TBEC: 40.0000 mg | DELAYED_RELEASE_TABLET | Freq: Two times a day (BID) | ORAL | Status: DC

## 2015-09-09 MED ORDER — FUROSEMIDE 20 MG PO TABS: 20.0000 mg | ORAL_TABLET | Freq: Every day | ORAL | Status: DC

## 2015-09-09 MED ORDER — FOLIC ACID 1 MG PO TABS: 1.0000 mg | ORAL_TABLET | Freq: Every day | ORAL | Status: DC

## 2015-09-09 MED ORDER — BOOST / RESOURCE BREEZE PO LIQD: 1.0000 | Freq: Three times a day (TID) | ORAL | Status: DC

## 2015-09-09 MED ORDER — POLYETHYLENE GLYCOL 3350 17 G PO PACK: 17.0000 g | PACK | Freq: Every day | ORAL | Status: DC

## 2015-09-09 MED ORDER — THIAMINE HCL 100 MG PO TABS: 100.0000 mg | ORAL_TABLET | Freq: Every day | ORAL | Status: DC

## 2015-09-09 NOTE — Discharge Summary (Signed)
Physician Discharge Summary  Jeffrey Snow XFG:[id number] DOB: 09/15/1956 DOA: 09/05/2015  PCP: Jeffrey A., MD  Admit date: 09/05/2015 Discharge date: 09/09/2015  Time spent: 65 minutes  Recommendations for Outpatient Follow-up:  1. Follow-up with Jeffrey A., MD as scheduled. Follow up patient in need a CBC done to follow-up on his hemoglobin. Patient's blood pressure also need to be reassessed as he will resume his diuretics/antihypertensives medications in about 3 days post discharge. 2.  follow up with Jeffrey. Amedeo Snow, gastroenterology in one week. Follow-up patient in need a CBC done to follow-up on his hemoglobin.   Discharge Diagnoses:  Principal Problem:   Acute upper GI bleed Active Problems:   Hemorrhagic shock   Thrombocytopenia   HTN (hypertension)   Elevated LFTs   Cirrhosis, alcoholic   HCC (hepatocellular carcinoma)   Anemia, blood loss   Lactic acidosis   Hyperkalemia   Discharge Condition: Stable and improved  Diet recommendation: Heart healthy.  Filed Weights   09/07/15 0400 09/08/15 0502 09/09/15 0434  Weight: 67.6 kg (149 lb 0.5 oz) 67.586 kg (149 lb) 67.994 kg (149 lb 14.4 oz)    History of present illness:  Per Jeffrey Snow is a 59 y.o. male with past medical history of depression and anxiety, hepatitis C, history of hepatocellular carcinoma with a history of alcoholic cirrhosis hasn't drank in over 5 years, essential hypertension that came in with coffee ground hematemesis with blood clots that started the day prior to admission. He related he had vomited blood about 4-6 times overnight. He related he slept with a bucket next to him and in the middle of the night he also woke up vomiting blood. When he woke up on the morning of admission, he felt palpitations and lightheaded upon standing, with generalized weakness and shortness of breath. He related he has not consumed any alcohol or any NSAIDs. He had not had a bowel  movement but denied any history in the past 2 or 3 days of melanotic stools.  In the ED: Vitals were checked and he was hypotensive and given 2 L of normal saline and 2 large bores IVs were placed, he was given 2 units of packed red blood cells and GI was consulted who recommended an endoscopy. He was also started on Protonix IV    Hospital Course:  #1 acute upper GI bleed secondary to ulceration at the GE junction. Patient had presented with hematemesis and acute upper GI bleed. Patient was also noted to be in hemorrhagic shock on admission. Patient was admitted to the ICU 2 large-bore IVs placed and patient was hydrated with IV fluids. Patient was also transfused 2 units of packed red blood cells. GI was consulted and patient was seen in consultation by Jeffrey. Penelope Coop. Critical care was also consulted. Patient subsequently underwent an upper endoscopy on 09/05/2015 per Jeffrey. Penelope Coop which showed small ulceration at the GE junction. Surrounding GE junction mucosa very erythematous with no active bleed. Moderate hiatal hernia. Erythematous gastric mucosa. Moderate blackish colored liquid in the stomach with no evidence of active bleed. Patchy erythema in the duodenal bulb. Patient improved clinically did not have any further hematemesis or melanotic stools. Patient was placed on a PPI IV and subsequently transitioned to an oral PPI twice daily. Patient's blood pressure improved and stabilized. Patient's hemoglobin stabilized at 7.6 by day of discharge. Patient will be discharged home on a tonic 40 mg twice daily. Has been recommended that patient avoid aspirin and NSAIDs. Patient is  to follow-up with GI as outpatient.   #2 hemorrhagic shock Secondary to problem #1. Status post 2 units pack red blood cells. Patient was also placed on IV fluids and his antihypertensives medications held. Patient's blood pressure improved and shock had resolved by day of discharge.   #3 acute blood loss anemia Secondary to  problem #1. Status post 2 units pack red blood cells 09/05/2015. Patient was placed on IV fluids on admission and during the hospitalization. Patient had a dilutional component to his hemoglobin. Hemoglobin improved and stabilized at 7.6 by day of discharge. Patient's GI bleed improved and the had resolved by day of discharge.   #4 hyperkalemia Likely secondary to volume depletion in the setting of ACE inhibitor and Aldactone. ACE inhibitor and Aldactone were held. Patient's hyperkalemia had resolved by day of discharge.  #5 hypertension Antihypertensives medications were held during the hospitalization secondary to his presentation with hemorrhagic shock. Patient was placed on IV fluids and followed. Patient's blood pressure normalized. Patient may resume his diuretics/antihypertensives medications 2-3 days post discharge.   #6 elevated LFTs/alcoholic cirrhosis Patient denied any alcohol RESUMPTION IN OVER 5 YEARS. LFTs have normalized. Continued on thiamine and folic acid. Ativan withdrawal protocol was instituted.  #7 hepatocellular carcinoma Status post transarterial chemotherapy and percutaneous thermal ablation. Outpatient follow-up.  #8 chronic thrombocytopenia Stable.  #9 prehospitalization UTI Continued on ciprofloxacin during the hospitalization and completed his course of antibiotic therapy. During the hospitalization.   Procedures:  Upper endoscopy: Jeffrey. Penelope Coop 09/05/2015  2 units pack red blood cells 09/05/2015  Consultations:  Gastroenterology: Jeffrey. Penelope Coop 09/05/2015  PCCM: Jeffrey [name] 09/05/2015    Discharge Exam: Filed Vitals:   09/09/15 0434  BP: 114/49  Pulse: 63  Temp: 97 F (36.1 C)  Resp: 16    General: NAD Cardiovascular: RRR Respiratory: CTAB  Discharge Instructions   Discharge Instructions    Diet - low sodium heart healthy    Complete by:  As directed      Discharge instructions    Complete by:  As directed   Follow up with Jeffrey  A., MD as scheduled. Follow up with Jeffrey Snow, GI in 1 week.     Increase activity slowly    Complete by:  As directed           Current Discharge Medication List    START taking these medications   Details  feeding supplement (BOOST / RESOURCE BREEZE) LIQD Take 1 Container by mouth 3 (three) times daily between meals. Refills: 0    folic acid (FOLVITE) 1 MG tablet Take 1 tablet (1 mg total) by mouth daily.    polyethylene glycol (MIRALAX / GLYCOLAX) packet Take 17 g by mouth daily. Qty: 14 each, Refills: 0    thiamine 100 MG tablet Take 1 tablet (100 mg total) by mouth daily.      CONTINUE these medications which have CHANGED   Details  furosemide (LASIX) 20 MG tablet Take 1 tablet (20 mg total) by mouth daily. Resume in 2-3 days. Qty: 30 tablet    pantoprazole (PROTONIX) 40 MG tablet Take 1 tablet (40 mg total) by mouth 2 (two) times daily before a meal. Qty: 62 tablet, Refills: 0    spironolactone (ALDACTONE) 25 MG tablet Take 3 tablets (75 mg total) by mouth every morning. Resume in 2-3 days.      CONTINUE these medications which have NOT CHANGED   Details  ALPRAZolam (XANAX) 0.5 MG tablet Take 0.5 mg by mouth 2 (two) times  daily.     bisacodyl (DULCOLAX) 5 MG EC tablet Take 5 mg by mouth daily as needed for mild constipation or moderate constipation.    FLUoxetine (PROZAC) 40 MG capsule Take 1 capsule by mouth every morning. Refills: 2    zolpidem (AMBIEN) 10 MG tablet Take 10 mg by mouth at bedtime as needed for sleep.    Associated Diagnoses: Thrombocytopenia    docusate sodium 100 MG CAPS Take 100 mg by mouth 2 (two) times daily. Qty: 20 capsule, Refills: 0    oxycodone (OXY-IR) 5 MG capsule Take 1 capsule (5 mg total) by mouth every 4 (four) hours as needed. Qty: 30 capsule, Refills: 0   Associated Diagnoses: HCC (hepatocellular carcinoma); Abdominal pain, chronic, right upper quadrant      STOP taking these medications     aspirin 81 MG tablet       benazepril (LOTENSIN) 10 MG tablet      ciprofloxacin (CIPRO) 500 MG tablet      ergocalciferol (DRISDOL) 50000 UNITS capsule        Allergies  Allergen Reactions  . Folic Acid Other (See Comments)    Makes stomach feel funny   Follow-up Information    Follow up with Jeffrey A., MD In 2 weeks.   Specialty:  Internal Medicine   Why:  8:30am on Oct 06,2016,, AT POST HOSP. VISIT, LIST OF MEDS, ARRIVE 15 PRIOR TO APPT   Contact information:   [place] [address] [phone]       Follow up with HAYES,JOHN C, MD. Schedule an appointment as soon as possible for a visit in 1 week.   Specialty:  Gastroenterology   Contact information:   [address] [place] [address] [phone]        The results of significant diagnostics from this hospitalization (including imaging, microbiology, ancillary and laboratory) are listed below for reference.    Significant Diagnostic Studies: No results found.  Microbiology: Recent Results (from the past 240 hour(s))  Urine culture     Status: None   Collection Time: 08/31/15  4:25 PM  Result Value Ref Range Status   Culture STAPHYLOCOCCUS SPECIES (COAGULASE NEGATIVE)  Final   Colony Count >=100,000 COLONIES/ML  Final   Organism ID, Bacteria STAPHYLOCOCCUS SPECIES (COAGULASE NEGATIVE)  Final    Comment: Rifampin and Gentamicin should not be used as single drugs for treatment of Staph infections.       Susceptibility   Staphylococcus species (coagulase negative) -  (no method available)    OXACILLIN <=0.25 Sensitive     CEFAZOLIN  Sensitive     GENTAMICIN <=0.5 Sensitive     CIPROFLOXACIN <=0.5 Sensitive     LEVOFLOXACIN 0.25 Sensitive     NITROFURANTOIN <=16 Sensitive     TRIMETH/SULFA 160 Resistant     VANCOMYCIN 1 Sensitive     RIFAMPIN <=0.5 Sensitive     TETRACYCLINE 2 Sensitive   MRSA PCR Screening     Status: None   Collection Time: 09/05/15  6:50 PM  Result Value Ref  Range Status   MRSA by PCR NEGATIVE NEGATIVE Final    Comment:        The GeneXpert MRSA Assay (FDA approved for NASAL specimens only), is one component of a comprehensive MRSA colonization surveillance program. It is not intended to diagnose MRSA infection nor to guide or monitor treatment for MRSA infections.      Labs: Basic Metabolic Panel:  Recent  Labs Lab 09/05/15 0746 09/05/15 1019 09/06/15 0610 09/07/15 0400 09/08/15 0540  NA 135 140 142 141 141  K 6.2* 5.7* 4.2 4.1 3.6  CL 105 111 117* 115* 113*  CO2 18*  --  21* 21* 22  GLUCOSE 163* 127* 86 113* 119*  BUN 50* 45* 44* 24* 11  CREATININE 1.31* 1.20 1.02 1.01 0.96  CALCIUM 8.5*  --  8.4* 8.4* 8.0*   Liver Function Tests:  Recent Labs Lab 09/05/15 0746 09/06/15 0610  AST 47* 34  ALT 26 24  ALKPHOS 86 64  BILITOT 1.0 1.1  PROT 5.1* 4.9*  ALBUMIN 2.7* 2.8*   No results for input(s): LIPASE, AMYLASE in the last 168 hours.  Recent Labs Lab 09/05/15 0746  AMMONIA 106*   CBC:  Recent Labs Lab 09/05/15 0746  09/06/15 0610 09/06/15 1617 09/07/15 0400 09/07/15 1430 09/08/15 0540 09/08/15 1432 09/09/15 0513  WBC 10.2  < > 7.6 5.0 3.9*  --  3.2*  --  2.9*  NEUTROABS 8.4*  --   --   --   --   --   --   --   --   HGB 7.7*  < > 8.9* 8.2* 7.9* 8.4* 7.4* 7.8* 7.6*  HCT 22.9*  < > 26.0* 24.4* 23.3* 25.0* 22.1* 23.3* 22.6*  MCV 98.7  < > 93.5 95.7 96.3  --  96.9  --  96.6  PLT 106*  < > 58* 55* 52*  --  45*  --  49*  < > = values in this interval not displayed. Cardiac Enzymes: No results for input(s): CKTOTAL, CKMB, CKMBINDEX, TROPONINI in the last 168 hours. BNP: BNP (last 3 results) No results for input(s): BNP in the last 8760 hours.  ProBNP (last 3 results) No results for input(s): PROBNP in the last 8760 hours.  CBG: No results for input(s): GLUCAP in the last 168 hours.     SignedIrine Seal MD [facility] 09/09/2015, 3:46 PM

## 2015-09-09 NOTE — Progress Notes (Signed)
Subjective: No further blood in stool. No hematemesis. No abdominal pain. Tolerating diet.  Objective: Vital signs in last 24 hours: Temp:  [97 F (36.1 C)-98.3 F (36.8 C)] 97 F (36.1 C) (09/24 0434) Pulse Rate:  [63-67] 63 (09/24 0434) Resp:  [16] 16 (09/24 0434) BP: (114-115)/(49-53) 114/49 mmHg (09/24 0434) SpO2:  [97 %-100 %] 97 % (09/24 0434) Weight:  [67.994 kg (149 lb 14.4 oz)] 67.994 kg (149 lb 14.4 oz) (09/24 0434) Weight change: 0.408 kg (14.4 oz) Last BM Date: 09/04/15  PE: GEN:  Cachectic, chronically ill-appearing but is in NAD SKIN:  Scattered ecchymoses ABD:  Soft, non-tender  Lab Results: CBC    Component Value Date/Time   WBC 2.9* 09/09/2015 0513   WBC 4.1 10/12/2012 1331   RBC 2.34* 09/09/2015 0513   RBC 2.35* 12/16/2014 1331   RBC 4.70 10/12/2012 1331   HGB 7.6* 09/09/2015 0513   HGB 14.6 10/12/2012 1331   HCT 22.6* 09/09/2015 0513   HCT 42.7 10/12/2012 1331   PLT 49* 09/09/2015 0513   PLT 115* 10/12/2012 1331   MCV 96.6 09/09/2015 0513   MCV 90.9 10/12/2012 1331   MCH 32.5 09/09/2015 0513   MCH 31.1 10/12/2012 1331   MCHC 33.6 09/09/2015 0513   MCHC 34.2 10/12/2012 1331   RDW 14.7 09/09/2015 0513   RDW 13.7 10/12/2012 1331   LYMPHSABS 1.2 09/05/2015 0746   LYMPHSABS 1.2 10/12/2012 1331   MONOABS 0.6 09/05/2015 0746   MONOABS 0.5 10/12/2012 1331   EOSABS 0.0 09/05/2015 0746   EOSABS 0.0 10/12/2012 1331   BASOSABS 0.0 09/05/2015 0746   BASOSABS 0.0 10/12/2012 1331   CMP     Component Value Date/Time   NA 141 09/08/2015 0540   NA 134* 10/12/2012 1331   K 3.6 09/08/2015 0540   K 4.3 10/12/2012 1331   CL 113* 09/08/2015 0540   CL 103 10/12/2012 1331   CO2 22 09/08/2015 0540   CO2 22 10/12/2012 1331   GLUCOSE 119* 09/08/2015 0540   GLUCOSE 103* 10/12/2012 1331   BUN 11 09/08/2015 0540   BUN 7.0 10/12/2012 1331   CREATININE 0.96 09/08/2015 0540   CREATININE 0.96 08/31/2015 1220   CREATININE 0.8 10/12/2012 1331   CALCIUM 8.0*  09/08/2015 0540   CALCIUM 9.3 10/12/2012 1331   PROT 4.9* 09/06/2015 0610   PROT 6.5 10/12/2012 1331   ALBUMIN 2.8* 09/06/2015 0610   ALBUMIN 3.8 10/12/2012 1331   AST 34 09/06/2015 0610   AST 111* 10/12/2012 1331   ALT 24 09/06/2015 0610   ALT 151* 10/12/2012 1331   ALKPHOS 64 09/06/2015 0610   ALKPHOS 88 10/12/2012 1331   BILITOT 1.1 09/06/2015 0610   BILITOT 1.08 10/12/2012 1331   GFRNONAA >60 09/08/2015 0540   GFRNONAA 86 08/31/2015 1220   GFRAA >60 09/08/2015 0540   GFRAA >89 08/31/2015 1220   Assessment:  1.  GI bleed.  Likely from ulceration at GE junction, clinically resolved. 2.  Cirrhosis with hepatoma.  Plan:  1.  Pantoprazole 40 mg po bid, now and upon discharge. 2.  Advance diet as tolerated. 3.  OK to be discharged from GI perspective. 4.  Patient needs to follow-up with [name] [facility], [phone]) as outpatient. 5.  Will sign-off; please call with questions.   Jeffrey Snow 09/09/2015, 2:18 PM   Pager [phone] If no answer or after 5 PM call [phone]

## 2015-09-21 ENCOUNTER — Ambulatory Visit: Payer: Self-pay | Admitting: Family Medicine

## 2015-10-09 ENCOUNTER — Ambulatory Visit (INDEPENDENT_AMBULATORY_CARE_PROVIDER_SITE_OTHER): Payer: Medicaid Other | Admitting: Family Medicine

## 2015-10-09 ENCOUNTER — Encounter: Payer: Self-pay | Admitting: Family Medicine

## 2015-10-09 VITALS — BP 150/67 | HR 68 | Temp 98.2°F | Resp 18 | Wt 152.0 lb

## 2015-10-09 DIAGNOSIS — Z0000 Encounter for general adult medical examination without abnormal findings: Secondary | ICD-10-CM

## 2015-10-09 DIAGNOSIS — K922 Gastrointestinal hemorrhage, unspecified: Secondary | ICD-10-CM | POA: Diagnosis not present

## 2015-10-09 NOTE — Patient Instructions (Signed)
Follow-up with Jeffrey Snow as planned in 2 months. Call when you need a refill on oxycodone. You received a flu shot today.

## 2015-10-11 NOTE — Progress Notes (Signed)
Patient ID: Jeffrey Snow, male   DOB: [birth year], 59 y.o.   MRN: [id number]   Sabre Leonetti, is a 59 y.o. male  NID:[id number]  RWE:[id number]  DOB - 08/05/1956  CC:  Chief Complaint  Patient presents with  . Follow-up    Hospital Follow up       HPI: Jeffrey Snow is a 59 y.o. male here for follow-up from a hospital visit from 9/20 to 9/24 for an upper GI bleed. He denies needing anything today. He was just told to follow-up. He also had an appointment with GI about a week ago. He is in the process of arranging treatment for Hep C.  He has a regular follow-up for chronic conditions coming up with Mrs. Hollis. He reports having no further episodes of hematesis. He reports feeling well.He has a history of PAD, alcoholic cirrhosis and hepatocellular carcinoma. He suffers with depression, anxiety and panic attacks. He has a history of hypertension. He was taking off his BP meds which in hospital due to hypotension. He has not yet resumed. His BP today is 150/67 Allergies  Allergen Reactions  . Folic Acid Other (See Comments)    Makes stomach feel funny   Past Medical History  Diagnosis Date  . Depression   . Anxiety   . Panic attack   . Hypertension   . Hepatitis C   . Claudication of left lower extremity (HCC) m-1  . Abnormal MRI of abdomen, liver  04/21/2014  . Cirrhosis, alcoholic ([place]) 05/16/9508  . Liver mass   . [place] (hepatocellular carcinoma) ([place])    Current Outpatient Prescriptions on File Prior to Visit  Medication Sig Dispense Refill  . ALPRAZolam (XANAX) 0.5 MG tablet Take 0.5 mg by mouth 2 (two) times daily.     . bisacodyl (DULCOLAX) 5 MG EC tablet Take 5 mg by mouth daily as needed for mild constipation or moderate constipation.    . feeding supplement (BOOST / RESOURCE BREEZE) LIQD Take 1 Container by mouth 3 (three) times daily between meals.  0  . FLUoxetine (PROZAC) 40 MG capsule Take 1 capsule by mouth every morning.  2  . oxycodone (OXY-IR) 5 MG capsule  Take 1 capsule (5 mg total) by mouth every 4 (four) hours as needed. 30 capsule 0  . pantoprazole (PROTONIX) 40 MG tablet Take 1 tablet (40 mg total) by mouth 2 (two) times daily before a meal. 62 tablet 0  . spironolactone (ALDACTONE) 25 MG tablet Take 3 tablets (75 mg total) by mouth every morning. Resume in 2-3 days.    Marland Kitchen zolpidem (AMBIEN) 10 MG tablet Take 10 mg by mouth at bedtime as needed for sleep.     Marland Kitchen docusate sodium 100 MG CAPS Take 100 mg by mouth 2 (two) times daily. (Patient not taking: Reported on 09/05/2015) 20 capsule 0  . folic acid (FOLVITE) 1 MG tablet Take 1 tablet (1 mg total) by mouth daily. (Patient not taking: Reported on 10/09/2015)    . furosemide (LASIX) 20 MG tablet Take 1 tablet (20 mg total) by mouth daily. Resume in 2-3 days. (Patient not taking: Reported on 10/09/2015) 30 tablet   . polyethylene glycol (MIRALAX / GLYCOLAX) packet Take 17 g by mouth daily. (Patient not taking: Reported on 10/09/2015) 14 each 0  . thiamine 100 MG tablet Take 1 tablet (100 mg total) by mouth daily. (Patient not taking: Reported on 10/09/2015)     No current facility-administered medications on file prior to visit.   Family  History  Problem Relation Age of Onset  . Diabetes Father   . Diabetes Brother   . Diabetes Brother    Social History   Social History  . Marital Status: Single    Spouse Name: N/A  . Number of Children: 3  . Years of Education: N/A   Occupational History  .  [employer]   Social History Main Topics  . Smoking status: Former Smoker -- 0.50 packs/day    Quit date: 08/21/2011  . Smokeless tobacco: Never Used  . Alcohol Use: No     Comment: Occasional beer on the weekends  . Drug Use: No     Comment: abused cocaine in the 80's  . Sexual Activity: No   Other Topics Concern  . Not on file   Social History Narrative   Single, never married.  Lives alone.  3 daughters, 16 grandchildren who are not very involved in the patient's care.  Quit smoking  and heavy drinking 3.5 years ago.     Review of Systems: Constitutional: Negative  HENT: Negative  Eyes: Negative  Neck: Negative  Respiratory: Negative  Cardiovascular: Negative  Gastrointestinal: Negative except for constipation for which he takes docolax.  Genitourinary: Negative  Musculoskeletal: Negative except for right foot pain and leg pain due to PAD Neurological: Negative  Hematological: Positive for recent anemia. Psychiatric/Behavioral: Depression, anxiety.    Objective:   Filed Vitals:   10/09/15 1323  BP: 150/67  Pulse: 68  Temp: 98.2 F (36.8 C)  Resp: 18    Physical Exam: Constitutional: Patient appears well-developed and well-nourished. No distress. HENT: Normocephalic, atraumatic, External right and left ear normal. Oropharynx is clear and moist.  Eyes: Conjunctivae and EOM are normal. PERRLA, no scleral icterus. Neck: Normal ROM. Neck supple. No lymphadenopathy, No thyromegaly. CVS: RRR, S1/S2 +, no murmurs, no gallops, no rubs Pulmonary: Effort and breath sounds normal, no stridor, rhonchi, wheezes, rales.  Abdominal: Soft. Normoactive BS,, no distension, tenderness, rebound or guarding.  Musculoskeletal: Normal range of motion. No edema and no tenderness.  Neuro: Alert.Normal muscle tone coordination. Non-focal Skin: Skin is warm and dry. No rash noted. Not diaphoretic. No erythema. No pallor. Psychiatric: Normal mood and affect. Behavior, judgment, thought content normal.  Lab Results  Component Value Date   WBC 2.9* 09/09/2015   HGB 7.6* 09/09/2015   HCT 22.6* 09/09/2015   MCV 96.6 09/09/2015   PLT 49* 09/09/2015   Lab Results  Component Value Date   CREATININE 0.96 09/08/2015   BUN 11 09/08/2015   NA 141 09/08/2015   K 3.6 09/08/2015   CL 113* 09/08/2015   CO2 22 09/08/2015    Lab Results  Component Value Date   HGBA1C 5.6 06/01/2015   Lipid Panel     Component Value Date/Time   CHOL 113 01/26/2015 0834   TRIG 46 01/26/2015  0834   HDL 35* 01/26/2015 0834   CHOLHDL 3.2 01/26/2015 0834   VLDL 9 01/26/2015 0834   LDLCALC 69 01/26/2015 0834       Assessment and plan:   1. Health care maintenance - Flu Vaccine QUAD 36+ mos PF IM (Fluarix & Fluzone Quad PF)  2. Gastrointestinal hemorrhage, unspecified gastritis, unspecified gastrointestinal hemorrhage type -CBC to check on anemia from acute blood loss  3. Hypertension -Restart Aldactone 25 mg. One po qd day -Continue folic acid  4. Alcholism -Continue Thiamine    Follow-up with Mrs. Hollis as planned previously.  The patient was given clear instructions to go  to ER or return to medical center if symptoms don't improve, worsen or new problems develop. The patient verbalized understanding.      Micheline Chapman, MSN, FNP-BC   10/11/2015, 2:41 PM

## 2015-11-03 ENCOUNTER — Other Ambulatory Visit: Payer: Self-pay | Admitting: *Deleted

## 2015-11-03 DIAGNOSIS — C220 Liver cell carcinoma: Secondary | ICD-10-CM

## 2015-11-17 ENCOUNTER — Other Ambulatory Visit (HOSPITAL_COMMUNITY): Payer: Self-pay | Admitting: Interventional Radiology

## 2015-11-17 DIAGNOSIS — C220 Liver cell carcinoma: Secondary | ICD-10-CM

## 2015-12-05 LAB — CBC
HCT: 36.7 % — ABNORMAL LOW (ref 39.0–52.0)
Hemoglobin: 11.1 g/dL — ABNORMAL LOW (ref 13.0–17.0)
MCH: 23.8 pg — ABNORMAL LOW (ref 26.0–34.0)
MCHC: 30.2 g/dL (ref 30.0–36.0)
MCV: 78.6 fL (ref 78.0–100.0)
Platelets: 75 10*3/uL — ABNORMAL LOW (ref 150–400)
RBC: 4.67 MIL/uL (ref 4.22–5.81)
RDW: 17.9 % — ABNORMAL HIGH (ref 11.5–15.5)
WBC: 4.7 10*3/uL (ref 4.0–10.5)

## 2015-12-05 LAB — COMPLETE METABOLIC PANEL WITH GFR
ALT: 12 U/L (ref 9–46)
AST: 13 U/L (ref 10–35)
Albumin: 3.8 g/dL (ref 3.6–5.1)
Alkaline Phosphatase: 122 U/L — ABNORMAL HIGH (ref 40–115)
BUN: 9 mg/dL (ref 7–25)
CO2: 29 mmol/L (ref 20–31)
Calcium: 9 mg/dL (ref 8.6–10.3)
Chloride: 103 mmol/L (ref 98–110)
Creat: 0.83 mg/dL (ref 0.70–1.25)
GFR, Est African American: >89 mL/min (ref >=60)
GFR, Est Non African American: >89 mL/min (ref >=60)
Glucose, Bld: 59 mg/dL — ABNORMAL LOW (ref 65–99)
Potassium: 3.6 mmol/L (ref 3.5–5.3)
Sodium: 138 mmol/L (ref 135–146)
Total Bilirubin: 0.6 mg/dL (ref 0.2–1.2)
Total Protein: 7 g/dL (ref 6.1–8.1)

## 2015-12-05 LAB — PROTIME-INR
INR: 1.17 (ref <1.50)
Prothrombin Time: 15 seconds (ref 11.6–15.2)

## 2015-12-20 ENCOUNTER — Ambulatory Visit
Admission: RE | Admit: 2015-12-20 | Discharge: 2015-12-20 | Disposition: A | Discharge status: Home / Self Care | Payer: Medicaid Other | Source: Ambulatory Visit | Attending: Interventional Radiology | Admitting: Interventional Radiology

## 2015-12-20 ENCOUNTER — Ambulatory Visit (HOSPITAL_COMMUNITY)
Admission: RE | Admit: 2015-12-20 | Discharge: 2015-12-20 | Disposition: A | Discharge status: Home / Self Care | Payer: Medicaid Other | Source: Ambulatory Visit | Attending: Interventional Radiology | Admitting: Interventional Radiology

## 2015-12-20 DIAGNOSIS — K7460 Unspecified cirrhosis of liver: Secondary | ICD-10-CM | POA: Diagnosis not present

## 2015-12-20 DIAGNOSIS — I8500 Esophageal varices without bleeding: Secondary | ICD-10-CM | POA: Diagnosis not present

## 2015-12-20 DIAGNOSIS — I700 Atherosclerosis of aorta: Secondary | ICD-10-CM | POA: Diagnosis not present

## 2015-12-20 DIAGNOSIS — K8020 Calculus of gallbladder without cholecystitis without obstruction: Secondary | ICD-10-CM | POA: Insufficient documentation

## 2015-12-20 DIAGNOSIS — K760 Fatty (change of) liver, not elsewhere classified: Secondary | ICD-10-CM | POA: Diagnosis not present

## 2015-12-20 DIAGNOSIS — C220 Liver cell carcinoma: Secondary | ICD-10-CM | POA: Insufficient documentation

## 2015-12-20 DIAGNOSIS — R161 Splenomegaly, not elsewhere classified: Secondary | ICD-10-CM | POA: Diagnosis not present

## 2015-12-20 DIAGNOSIS — R591 Generalized enlarged lymph nodes: Secondary | ICD-10-CM | POA: Diagnosis not present

## 2015-12-20 MED ORDER — GADOXETATE DISODIUM 0.25 MMOL/ML IV SOLN
5.0000 mL | Freq: Once | INTRAVENOUS | Status: AC | PRN
  Administered 2015-12-20: 7 mL via INTRAVENOUS

## 2015-12-20 NOTE — Progress Notes (Signed)
Chief Complaint: Patient was seen in consultation today for  Chief Complaint  Patient presents with  . Follow-up    follow up DEB-TACE & MWA   at the request of Zia Kanner  Referring Physician(s): Estoria Geary  History of Present Illness: Jeffrey Snow is a 60 y.o. male with alcoholic and HCV cirrhosis complicated by hepatocellular carcinoma, ascites and esophageal varices. He was treated by combination drug-eluting bead transarterial chemoembolization followed by microwave thermal ablation on 6/25 and 06/10/2014. Follow-up surveillance imaging has demonstrated a complete response to therapy to this point.   [name] presents today for his 68 month follow-up evaluation. His MRI today again shows continued contraction of the prior treatment site with no evidence of nodularity or enhancement to suggest residual, recurrent or new hepatocellular carcinoma.  Clinically, [name] continues to do well. His ascites is very well managed. I see almost no ascitic fluid on his abdominal MRI and he has no current complaints of abdominal swelling or fluid. His energy level is good.   He has been on Harvoni therapy for his HCV which is prescribed by [name].  He seems to be tolerating this well and tells me his last blood work showed no evidence of residual HCV.   He continues to maintain his sobriety and is now 1.5 years sober which is fantastic.  Additionally, he has not smoked for the same duration.  He continues to follow a low-sodium diet.   He had a recent admission for upper GI bleed due to a distal esophageal ulcer.  He's been instructed to avoid ASA, NSAIDS and acidic foods.  He's now on a PPI and doing well.  No further bleeding.   Past Medical History  Diagnosis Date  . Depression   . Anxiety   . Panic attack   . Hypertension   . Hepatitis C   . Claudication of left lower extremity (HCC) m-1  . Abnormal MRI of abdomen, liver  04/21/2014  . Cirrhosis,  alcoholic ([place]) 123456  . Liver mass   . [place] (hepatocellular carcinoma) [facility])     Past Surgical History  Procedure Laterality Date  . Cosmetic surgery      facial reconstructive surgery  . Aortogram    . Abdominal aortagram N/A 12/27/2013    Procedure: ABDOMINAL Maxcine Ham;  Surgeon: Conrad Montezuma, MD;  Location: [facility] CATH LAB;  Service: Cardiovascular;  Laterality: N/A;  . Lower extremity angiogram Bilateral 12/27/2013    Procedure: LOWER EXTREMITY ANGIOGRAM;  Surgeon: Conrad [place], MD;  Location: [facility] CATH LAB;  Service: Cardiovascular;  Laterality: Bilateral;  . Esophagogastroduodenoscopy N/A 12/17/2014    Procedure: ESOPHAGOGASTRODUODENOSCOPY (EGD);  Surgeon: Missy Sabins, MD;  Location: [facility] ENDOSCOPY;  Service: Endoscopy;  Laterality: N/A;  . Esophagogastroduodenoscopy N/A 09/05/2015    Procedure: ESOPHAGOGASTRODUODENOSCOPY (EGD);  Surgeon: Wonda Horner, MD;  Location: [facility] ENDOSCOPY;  Service: Endoscopy;  Laterality: N/A;    Allergies: Folic acid  Medications: Prior to Admission medications   Medication Sig Start Date End Date Taking? Authorizing Provider  ALPRAZolam Duanne Moron) 0.5 MG tablet Take 0.5 mg by mouth 2 (two) times daily.    Yes Historical Provider, MD  bisacodyl (DULCOLAX) 5 MG EC tablet Take 5 mg by mouth daily as needed for mild constipation or moderate constipation.   Yes Historical Provider, MD  docusate sodium 100 MG CAPS Take 100 mg by mouth 2 (two) times daily. 06/11/14  Yes Ascencion Dike, PA-C  feeding supplement (BOOST / RESOURCE BREEZE) LIQD Take  1 Container by mouth 3 (three) times daily between meals. 09/09/15  Yes Eugenie Filler, MD  FLUoxetine (PROZAC) 40 MG capsule Take 1 capsule by mouth every morning. 05/02/15  Yes Historical Provider, MD  furosemide (LASIX) 20 MG tablet Take 1 tablet (20 mg total) by mouth daily. Resume in 2-3 days. 09/12/15  Yes Eugenie Filler, MD  oxycodone (OXY-IR) 5 MG capsule Take 1 capsule (5 mg total) by mouth every 4 (four) hours as  needed. 09/05/15  Yes Dorena Dew, FNP  pantoprazole (PROTONIX) 40 MG tablet Take 1 tablet (40 mg total) by mouth 2 (two) times daily before a meal. 09/09/15  Yes Eugenie Filler, MD  spironolactone (ALDACTONE) 25 MG tablet Take 3 tablets (75 mg total) by mouth every morning. Resume in 2-3 days. 09/12/15  Yes Eugenie Filler, MD  zolpidem (AMBIEN) 10 MG tablet Take 10 mg by mouth at bedtime as needed for sleep.    Yes Historical Provider, MD  folic acid (FOLVITE) 1 MG tablet Take 1 tablet (1 mg total) by mouth daily. Patient not taking: Reported on 10/09/2015 09/09/15   Eugenie Filler, MD  polyethylene glycol [facility] / Floria Raveling) packet Take 17 g by mouth daily. Patient not taking: Reported on 10/09/2015 09/09/15   Eugenie Filler, MD  thiamine 100 MG tablet Take 1 tablet (100 mg total) by mouth daily. Patient not taking: Reported on 10/09/2015 09/09/15   Eugenie Filler, MD     Family History  Problem Relation Age of Onset  . Diabetes Father   . Diabetes Brother   . Diabetes Brother     Social History   Social History  . Marital Status: Single    Spouse Name: N/A  . Number of Children: 3  . Years of Education: N/A   Occupational History  .  [employer]   Social History Main Topics  . Smoking status: Former Smoker -- 0.50 packs/day    Quit date: 08/21/2011  . Smokeless tobacco: Never Used  . Alcohol Use: No     Comment: Occasional beer on the weekends  . Drug Use: No     Comment: abused cocaine in the 80's  . Sexual Activity: No   Other Topics Concern  . Not on file   Social History Narrative   Single, never married.  Lives alone.  3 daughters, 16 grandchildren who are not very involved in the patient's care.  Quit smoking and heavy drinking 3.5 years ago.     Review of Systems: A 12 point ROS discussed and pertinent positives are indicated in the HPI above.  All other systems are negative.  Review of Systems  Vital Signs: BP 135/76 mmHg  Pulse 68   Temp(Src) 98.3 F (36.8 C) (Oral)  Resp 15  Ht 5\' 8"  (1.727 m)  Wt 150 lb (68.04 kg)  BMI 22.81 kg/m2  SpO2 99%  Physical Exam  Constitutional: He is oriented to person, place, and time. He appears well-developed and well-nourished. No distress.  HENT:  Head: Normocephalic and atraumatic.  Eyes: No scleral icterus.  Cardiovascular: Normal rate and regular rhythm.   Pulmonary/Chest: Effort normal and breath sounds normal.  Abdominal: Soft. Bowel sounds are normal. He exhibits no distension and no mass. There is no tenderness.  Neurological: He is alert and oriented to person, place, and time.  Skin: Skin is warm and dry.  Psychiatric: He has a normal mood and affect. His behavior is normal.  Nursing note and vitals  reviewed.  Imaging: Mr Abdomen W Wo Contrast  12/20/2015  CLINICAL DATA:  Cirrhosis. Right liver lobe hepatocellular carcinoma status post combined DEB-TACE and percutaneous thermal ablation in 2015, presenting for follow-up. EXAM: MRI ABDOMEN WITHOUT AND WITH CONTRAST TECHNIQUE: Multiplanar multisequence MR imaging of the abdomen was performed both before and after the administration of intravenous contrast. CONTRAST:  7 cc Eovist IV. COMPARISON:  05/30/2015 MRI abdomen. FINDINGS: Lower chest: Clear lung bases. Hepatobiliary: Liver surface is diffusely markedly irregular and there is relative hypertrophy of the lateral segment left liver lobe, in keeping with cirrhosis. Mild diffuse hepatic steatosis on chemical shift imaging. There is continued reduction in the size of the treated 3.2 x 3.1 cm segment 7 right liver lobe mass (series 500/ image 35), decreased from 3.6 x 3.2 cm on 05/30/2015, with no internal or capsular enhancement within the mass to suggest recurrent tumor. There are two 5 mm foci of arterial phase hyperenhancement in the segment 7 right liver lobe (series 501/images 30 and 32), which are occult on all other sequences and are favored to represent transient hepatic  perfusional phenomena. No new liver mass. Nondistended gallbladder again demonstrates several subcentimeter layering gallstones, with no gallbladder wall thickening or pericholecystic fluid. No biliary ductal dilatation. Common bile duct diameter 3 mm. No choledocholithiasis. Pancreas: No pancreatic mass or duct dilation.  No pancreas divisum. Spleen: Stable moderate splenomegaly (craniocaudal splenic length 17.7 cm). No splenic mass. Adrenals/Urinary Tract: Normal adrenals. No hydronephrosis. Normal kidneys with no renal mass. Stomach/Bowel: Grossly normal stomach. Visualized small and large bowel is normal caliber, with no bowel wall thickening. Vascular/Lymphatic: Atherosclerotic nonaneurysmal abdominal aorta. Patent portal, splenic, hepatic and renal veins. Stable small paraumbilical varix. Stable small paraesophageal varices. Stable mildly enlarged 1.2 cm porta hepatis lymph node (series 503/ image 42) . No new abdominal lymphadenopathy. Other: No abdominal ascites or focal fluid collection. Musculoskeletal: No aggressive appearing focal osseous lesions. IMPRESSION: 1. Continued reduction in the size of the treated segment 7 right liver lobe tumor, with no evidence of tumor recurrence. 2. No definite new liver masses. Two tiny nodular foci of arterial phase hyperenhancement in the segment 7 right liver lobe are favored to represent benign transient hepatic perfusional phenomena, recommend attention to these foci on follow-up MRI abdomen with and without IV contrast in 3-6 months. 3. Cirrhosis. Stable moderate splenomegaly and mild paraumbilical and paraesophageal varices. No ascites. 4. Stable mild porta hepatis lymphadenopathy, nonspecific, likely reactive. 5. Stable cholelithiasis.  No biliary ductal dilatation. Electronically Signed   By: Ilona Sorrel M.D.   On: 12/20/2015 10:39    Labs:  CBC:  Recent Labs  09/07/15 0400  09/08/15 0540 09/08/15 1432 09/09/15 0513 12/04/15 1412  WBC 3.9*  --   3.2*  --  2.9* 4.7  HGB 7.9*  < > 7.4* 7.8* 7.6* 11.1*  HCT 23.3*  < > 22.1* 23.3* 22.6* 36.7*  PLT 52*  --  45*  --  49* 75*  < > = values in this interval not displayed.  COAGS:  Recent Labs  05/22/15 0716 08/05/15 1137 09/05/15 0746 12/04/15 1412  INR CANCELED  1.33 1.22 1.55* 1.17    BMP:  Recent Labs  09/06/15 0610 09/07/15 0400 09/08/15 0540 12/04/15 1412  NA 142 141 141 138  K 4.2 4.1 3.6 3.6  CL 117* 115* 113* 103  CO2 21* 21* 22 29  GLUCOSE 86 113* 119* 59*  BUN 44* 24* 11 9  CALCIUM 8.4* 8.4* 8.0* 9.0  CREATININE 1.02  1.01 0.96 0.83  GFRNONAA >60 >60 >60 >89  GFRAA >60 >60 >60 >89    LIVER FUNCTION TESTS:  Recent Labs  08/31/15 1220 09/05/15 0746 09/06/15 0610 12/04/15 1412  BILITOT 0.6 1.0 1.1 0.6  AST 25 47* 34 13  ALT 20 26 24 12   ALKPHOS 108 86 64 122*  PROT 6.3 5.1* 4.9* 7.0  ALBUMIN 3.7 2.7* 2.8* 3.8    TUMOR MARKERS: No results for input(s): AFPTM, CEA, CA199, CHROMGRNA in the last 8760 hours.  Assessment and Plan:  Truly remarkable outcome for this gentleman compared to when I first saw him as an in-patient in May of 2015.  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     I offered my sincere encouragement and congratulations to him for his successful efforts at self rehabilitation and for taking ownership of his health and healthcare.     1.) Next MRI with gadolinium (Eovist) in 6 months (July 2017) followed by clinic visit and liver labs (if not recently drawn).     SignedJacqulynn Cadet 12/20/2015, 11:28 AM   I spent a total of 15 Minutes in face to face in clinical consultation, greater than 50% of which was counseling/coordinating care for [facility].

## 2016-01-15 ENCOUNTER — Other Ambulatory Visit: Payer: Self-pay | Admitting: Internal Medicine

## 2016-02-14 ENCOUNTER — Telehealth: Payer: Self-pay | Admitting: *Deleted

## 2016-02-14 NOTE — Telephone Encounter (Signed)
Pt called and needs a refill of his Lasix. Pt also stated that he called in for a refill for his oxycodone but was never told when to come pick it up. Please advise provider. Thanks

## 2016-02-14 NOTE — Telephone Encounter (Signed)
Refill request for Lasix and Oxycodone. LOV 10/10/2016. Please advise. Thanks!

## 2016-02-15 ENCOUNTER — Other Ambulatory Visit: Payer: Self-pay | Admitting: Family Medicine

## 2016-02-15 NOTE — Telephone Encounter (Signed)
Can you address this call.  He has an appointment with your coming up.

## 2016-02-16 NOTE — Telephone Encounter (Signed)
Patient was on oxycodone in the past for chronic pain management. He has an appointment on 02/27/2016, we will discuss further at that time. I have not seen this patient since 08/31/2015. I will not prescribe opiate medications for this patient at this time.    Dorena Dew, FNP

## 2016-02-20 ENCOUNTER — Other Ambulatory Visit: Payer: Self-pay | Admitting: Family Medicine

## 2016-02-27 ENCOUNTER — Ambulatory Visit: Payer: Self-pay | Admitting: Family Medicine

## 2016-04-03 ENCOUNTER — Encounter: Payer: Self-pay | Admitting: Family Medicine

## 2016-04-03 ENCOUNTER — Ambulatory Visit (INDEPENDENT_AMBULATORY_CARE_PROVIDER_SITE_OTHER): Payer: Medicaid Other | Admitting: Family Medicine

## 2016-04-03 VITALS — BP 140/74 | HR 56 | Temp 97.4°F | Ht 68.0 in | Wt 164.0 lb

## 2016-04-03 DIAGNOSIS — R21 Rash and other nonspecific skin eruption: Secondary | ICD-10-CM

## 2016-04-03 DIAGNOSIS — G629 Polyneuropathy, unspecified: Secondary | ICD-10-CM | POA: Diagnosis not present

## 2016-04-03 DIAGNOSIS — L299 Pruritus, unspecified: Secondary | ICD-10-CM | POA: Diagnosis not present

## 2016-04-03 DIAGNOSIS — R7989 Other specified abnormal findings of blood chemistry: Secondary | ICD-10-CM

## 2016-04-03 DIAGNOSIS — D649 Anemia, unspecified: Secondary | ICD-10-CM

## 2016-04-03 DIAGNOSIS — E559 Vitamin D deficiency, unspecified: Secondary | ICD-10-CM

## 2016-04-03 DIAGNOSIS — R945 Abnormal results of liver function studies: Secondary | ICD-10-CM

## 2016-04-03 DIAGNOSIS — I10 Essential (primary) hypertension: Secondary | ICD-10-CM

## 2016-04-03 LAB — CBC WITH DIFFERENTIAL/PLATELET
Basophils Absolute: 0 cells/uL (ref 0–200)
Basophils Relative: 0 %
Eosinophils Absolute: 156 cells/uL (ref 15–500)
Eosinophils Relative: 4 %
HCT: 37.5 % — ABNORMAL LOW (ref 38.5–50.0)
Hemoglobin: 11.2 g/dL — ABNORMAL LOW (ref 13.2–17.1)
Lymphocytes Relative: 32 %
Lymphs Abs: 1248 cells/uL (ref 850–3900)
MCH: 23.1 pg — ABNORMAL LOW (ref 27.0–33.0)
MCHC: 29.9 g/dL — ABNORMAL LOW (ref 32.0–36.0)
MCV: 77.3 fL — ABNORMAL LOW (ref 80.0–100.0)
Monocytes Absolute: 624 cells/uL (ref 200–950)
Monocytes Relative: 16 %
Neutro Abs: 1872 cells/uL (ref 1500–7800)
Neutrophils Relative %: 48 %
Platelets: 55 10*3/uL — ABNORMAL LOW (ref 140–400)
RBC: 4.85 MIL/uL (ref 4.20–5.80)
RDW: 16.4 % — ABNORMAL HIGH (ref 11.0–15.0)
WBC: 3.9 10*3/uL (ref 3.8–10.8)

## 2016-04-03 LAB — COMPLETE METABOLIC PANEL WITH GFR
ALT: 10 U/L (ref 9–46)
AST: 14 U/L (ref 10–35)
Albumin: 4.1 g/dL (ref 3.6–5.1)
Alkaline Phosphatase: 145 U/L — ABNORMAL HIGH (ref 40–115)
BUN: 10 mg/dL (ref 7–25)
CO2: 24 mmol/L (ref 20–31)
Calcium: 9 mg/dL (ref 8.6–10.3)
Chloride: 102 mmol/L (ref 98–110)
Creat: 0.98 mg/dL (ref 0.70–1.25)
GFR, Est African American: >89 mL/min (ref >=60)
GFR, Est Non African American: 83 mL/min (ref >=60)
Glucose, Bld: 68 mg/dL (ref 65–99)
Potassium: 4 mmol/L (ref 3.5–5.3)
Sodium: 138 mmol/L (ref 135–146)
Total Bilirubin: 0.8 mg/dL (ref 0.2–1.2)
Total Protein: 7 g/dL (ref 6.1–8.1)

## 2016-04-03 MED ORDER — GABAPENTIN 100 MG PO CAPS: 100.0000 mg | ORAL_CAPSULE | Freq: Three times a day (TID) | ORAL | Status: DC

## 2016-04-03 MED ORDER — HYDROXYZINE HCL 10 MG PO TABS: 10.0000 mg | ORAL_TABLET | Freq: Three times a day (TID) | ORAL | Status: DC | PRN

## 2016-04-03 MED ORDER — PERMETHRIN 5 % EX CREA: 1.0000 "application " | TOPICAL_CREAM | Freq: Once | CUTANEOUS | Status: DC

## 2016-04-03 NOTE — Progress Notes (Signed)
Subjective:    Patient ID: Jeffrey Snow, male    DOB: 1956/04/08, 60 y.o.   MRN: [id number]  HPI Jeffrey Snow, a 60 year old male with a history of hypertension and hepatitis C presents for a 3 month follow up chronic conditions. He is not exercising and is not adherent to low salt diet.  Blood pressure is well controlled at home. Patient has a history of claudication of left lower extremity . Patient denies chest pain, chest pressure/discomfort, exertional chest pressure/discomfort, irregular heart beat, lower extremity edema, palpitations and tachypnea.  Cardiovascular risk factors include: advanced age (older than 76 for men, 54 for women), dyslipidemia and sedentary lifestyle. Jeffrey Snow states that he is consistently taking medications.    Patient has a history of hepatitis C. He was under the care of hepatologist. He completed Harvoni therapy successfully several months ago. He continues to follow up with oncologist twice yearly for history of hepatocellular carcinoma.   Jeffrey Snow is complaining of burning pain to right foot. He states that pain has been present for greater than 1 year. Pain is worsened by walking long distances and is minimally relieved by rest. He says that pain is unrelieved by OTC analgesics. Current pain intensity is 6/10.   Patient complains of generalized rash that has been present over the past several weeks.  Appearance of rash at onset was red and generalized.  Rash has not changed over time. Discomfort associated with rash: is pruritic. Denies: abdominal pain, arthralgia, congestion, cough, crankiness, decrease in appetite, decrease in energy level, fever, headache, irritability, myalgia, nausea and sore throat. Patient has not had previous evaluation of rash. Jeffrey Snow has a history of scabies. Patient has had previous treatment.  Response to treatment.  Patient has not had contacts with similar rash. Patient has not identified precipitant. Patient  denies new exposures soaps, lotions, laundry detergents, foods, medications, plants, insects or animals. Past Medical History  Diagnosis Date  . Depression   . Anxiety   . Panic attack   . Hypertension   . Hepatitis C   . Claudication of left lower extremity (HCC) m-1  . Abnormal MRI of abdomen, liver  04/21/2014  . Cirrhosis, alcoholic ([place]) 123456  . Liver mass   . [place] (hepatocellular carcinoma) ([place])    Social History   Social History  . Marital Status: Single    Spouse Name: N/A  . Number of Children: 3  . Years of Education: N/A   Occupational History  .  [employer]   Social History Main Topics  . Smoking status: Former Smoker -- 0.50 packs/day    Quit date: 08/21/2011  . Smokeless tobacco: Never Used  . Alcohol Use: No     Comment: Occasional beer on the weekends  . Drug Use: No     Comment: abused cocaine in the 80's  . Sexual Activity: No   Other Topics Concern  . Not on file   Social History Narrative   Single, never married.  Lives alone.  3 daughters, 16 grandchildren who are not very involved in the patient's care.  Quit smoking and heavy drinking 3.5 years ago.    Allergies  Allergen Reactions  . Folic Acid Other (See Comments)    Makes stomach feel funny   Immunization History  Administered Date(s) Administered  . Influenza,inj,Quad PF,36+ Mos 12/20/2013, 12/21/2014, 10/09/2015  . Tdap 01/27/2014   Review of Systems  Constitutional: Positive for fatigue.  HENT: Negative.   Eyes: Negative.  Negative for visual disturbance.  Respiratory: Negative.   Cardiovascular: Negative.  Negative for chest pain, palpitations and leg swelling.  Gastrointestinal: Negative for vomiting and diarrhea.  Endocrine: Negative.  Negative for polydipsia and polyphagia.  Genitourinary: Negative.  Negative for frequency, hematuria and decreased urine volume.  Musculoskeletal: Negative.        Right foot pain  Skin: Positive for rash.  Neurological: Positive for  numbness. Negative for dizziness and weakness.  Hematological: Negative.   Psychiatric/Behavioral: Positive for agitation. Negative for suicidal ideas and sleep disturbance. The patient is nervous/anxious.        Objective:   Physical Exam  Constitutional: He is oriented to person, place, and time. He appears well-developed and well-nourished.  HENT:  Head: Normocephalic and atraumatic.  Right Ear: External ear normal.  Left Ear: External ear normal.  Mouth/Throat: Oropharynx is clear and moist.  Eyes: Conjunctivae are normal. Pupils are equal, round, and reactive to light.  Neck: Normal range of motion. Neck supple.  Cardiovascular: Normal rate, regular rhythm, normal heart sounds and intact distal pulses.   Pulmonary/Chest: Effort normal.  Abdominal: Soft. Bowel sounds are normal.  Musculoskeletal:       Right ankle: He exhibits normal range of motion, no swelling and normal pulse. Tenderness.  Neurological: He is alert and oriented to person, place, and time. He has normal reflexes.  Skin: Skin is dry and intact. Rash noted. Rash is maculopapular. He is not diaphoretic. No cyanosis. Nails show no clubbing.  Generalized, erythematous, pruritis, maculopapular rash   Psychiatric: His speech is normal. Judgment normal. His mood appears anxious. He expresses no homicidal and no suicidal ideation.        BP 140/74 mmHg  Pulse 56  Temp(Src) 97.4 F (36.3 C) (Oral)  Ht 5\' 8"  (1.727 m)  Wt 164 lb (74.39 kg)  BMI 24.94 kg/m2  SpO2 100% Assessment & Plan:  1. Neuropathy [facility]) Patient continues to complaining of burning pain to right foot. Will start a trial of gabapentin 100 mg three times per day. Will follow up in 1 month.  - gabapentin (NEURONTIN) 100 MG capsule; Take 1 capsule (100 mg total) by mouth 3 (three) times daily.  Dispense: 90 capsule; Refill: 0  2. Pruritic condition - hydrOXYzine (ATARAX/VISTARIL) 10 MG tablet; Take 1 tablet (10 mg total) by mouth 3 (three) times  daily as needed.  Dispense: 20 tablet; Refill: 0  3. Rash and nonspecific skin eruption Discussed rash at length. Jeffrey Snow has a history of Patient to follow up by phone.  - permethrin (ACTICIN) 5 % cream; Apply 1 application topically once.  Dispense: 60 g; Refill: 0  4. Vitamin D deficiency disease - Vitamin D, 25-hydroxy  5. Anemia, unspecified anemia type  - CBC with Differential  6. Elevated LFTs  - COMPLETE METABOLIC PANEL WITH GFR  7. Essential hypertension Blood pressure at goal on current medication regimen. Will continue current medication regimen. Reviewed urine for proteinuria, negative. The patient is asked to make an attempt to improve diet and exercise patterns to aid in medical management of this problem.Continue medication, monitor blood pressure at home. Continue DASH diet. Reminder to go to the ER if any CP, SOB, nausea, dizziness, severe HA, changes vision/speech, left arm numbness and tingling and jaw pain.   - Urinalysis complete  RTC: 1 month for right foot pain  Annaliz Aven M, FNP

## 2016-04-03 NOTE — Patient Instructions (Addendum)
Will start a trial of gabapentin for neuropathy/nerve pain of right foot. Will take gabapentin 100 mg three times per day for nerve pain.   Will start Hydroxyzine 10 mg three times per day as needed for itching.   Will apply permethrin once.  If you continue to have itching rash after 1 week please call office to discuss symptoms.   Rash A rash is a change in the color or texture of the skin. There are many different types of rashes. You may have other problems that accompany your rash. CAUSES   Infections.  Allergic reactions. This can include allergies to pets or foods.  Certain medicines.  Exposure to certain chemicals, soaps, or cosmetics.  Heat.  Exposure to poisonous plants.  Tumors, both cancerous and noncancerous. SYMPTOMS   Redness.  Scaly skin.  Itchy skin.  Dry or cracked skin.  Bumps.  Blisters.  Pain. DIAGNOSIS  Your caregiver may do a physical exam to determine what type of rash you have. A skin sample (biopsy) may be taken and examined under a microscope. TREATMENT  Treatment depends on the type of rash you have. Your caregiver may prescribe certain medicines. For serious conditions, you may need to see a skin doctor (dermatologist). HOME CARE INSTRUCTIONS   Avoid the substance that caused your rash.  Do not scratch your rash. This can cause infection.  You may take cool baths to help stop itching.  Only take over-the-counter or prescription medicines as directed by your caregiver.  Keep all follow-up appointments as directed by your caregiver. SEEK IMMEDIATE MEDICAL CARE IF:  You have increasing pain, swelling, or redness.  You have a fever.  You have new or severe symptoms.  You have body aches, diarrhea, or vomiting.  Your rash is not better after 3 days. MAKE SURE YOU:  Understand these instructions.  Will watch your condition.  Will get help right away if you are not doing well or get worse.   This information is not  intended to replace advice given to you by your health care provider. Make sure you discuss any questions you have with your health care provider.   Document Released: 11/22/2002 Document Revised: 12/23/2014 Document Reviewed: 04/19/2015 Elsevier Interactive Patient Education 2016 [employer] Permethrin lotion What is this medicine? PERMETHRIN (per METH rin) is used to treat head lice infestations. It acts by destroying both the lice and their eggs. This medicine may be used for other purposes; ask your health care provider or pharmacist if you have questions. What should I tell my health care provider before I take this medicine? They need to know if you have any of these conditions: -asthma -an unusual or allergic reaction to permethrin, veterinary or household insecticides, other medicines, chrysanthemums, foods, dyes, or preservatives -pregnant or trying to get pregnant -breast-feeding How should I use this medicine? This medicine is for external use only. Do not take by mouth. Shampoo your hair with regular shampoo, rinse and towel dry. Do NOT use a shampoo with a conditioner. Shake well before applying. Apply enough medicine to your hair to wet the hair and scalp (usually about 2 tablespoons), and thoroughly rub the medicine into your hair and scalp. Make sure you get behind the ears and on the back of the neck. Keep this medicine away from your eyes. If you accidentally get some in your eyes, rinse your eyes with water right away. Leave on your hair for 10 minutes, unless directed otherwise by your doctor or health care professional.  Then, rinse thoroughly with water. Dry with a clean towel. When your hair is dry, comb it with a fine toothed comb to remove any leftover nits (eggs) or nit shells. If you still have lice after one week, see your doctor or health care professional. You may need a second treatment. If you are applying this medicine to another person, wear plastic or disposable  gloves to protect yourself from infestation. Talk to your pediatrician regarding the use of this medicine in children. While this drug may be prescribed for children as young as 80 months old for selected conditions, precautions do apply. Overdosage: If you think you have taken too much of this medicine contact a poison control center or emergency room at once. NOTE: This medicine is only for you. Do not share this medicine with others. What if I miss a dose? This does not apply. What may interact with this medicine? Interactions are not expected. Do not use any other skin products on the affected area without telling your doctor or health care professional. This list may not describe all possible interactions. Give your health care provider a list of all the medicines, herbs, non-prescription drugs, or dietary supplements you use. Also tell them if you smoke, drink alcohol, or use illegal drugs. Some items may interact with your medicine. What should I watch for while using this medicine? This medicine is used as a single application treatment. If live lice are observed 7 or more days after initial application, a second treatment may be needed. Head lice can be spread from one person to another by direct contact with clothing, hats, scarves, bedding, towels, washcloths, hairbrushes, and combs. All members of your household should be examined for head lice and should receive treatment if they are found to be infected. If you have any questions about this, check with your doctor or health care professional. To prevent reinfection or spreading of the infection, the following steps should be taken: Machine wash all clothing, bedding, towels, and washcloths in very hot water and dry them using the hot cycle of a dryer for at least 20 minutes. Clothing or bedding that cannot be washed should be dry cleaned or sealed in an airtight plastic bag for 2 weeks. Shampoo any wigs or hairpieces. You should also wash  all hairbrushes and combs in very hot soapy water (above 130 degrees F) for 5 to 10 minutes. Do not share your hairbrushes or combs with other people. Wash all toys in very hot water (above 130 degrees F) for 5 to 10 minutes or seal in an airtight plastic bag for 2 weeks. Also, clean the house or room by vacuuming furniture, rugs, and floors. What side effects may I notice from receiving this medicine? Side effects that usually do not require medical attention (report to your doctor or health care professional if they continue or are bothersome): -itching -redness or mild swelling of the scalp -stinging or burning -tingling sensation This list may not describe all possible side effects. Call your doctor for medical advice about side effects. You may report side effects to FDA at 1-800-FDA-1088. Where should I keep my medicine? Keep out of the reach of children. Store at room temperature away from heat and direct light. Do not refrigerate or freeze. After treatment, throw away any unused medicine. NOTE: This sheet is a summary. It may not cover all possible information. If you have questions about this medicine, talk to your doctor, pharmacist, or health care provider.    2016,  Elsevier/Gold Standard. (2014-07-06 15:25:17)

## 2016-04-04 LAB — URINALYSIS, COMPLETE
Bacteria, UA: NONE SEEN [HPF]
Bilirubin Urine: NEGATIVE
Casts: NONE SEEN [LPF]
Crystals: NONE SEEN [HPF]
Glucose, UA: NEGATIVE
Hgb urine dipstick: NEGATIVE
Ketones, ur: NEGATIVE
Leukocytes, UA: NEGATIVE
Nitrite: NEGATIVE
Protein, ur: NEGATIVE
RBC / HPF: NONE SEEN RBC/HPF (ref <=2)
Specific Gravity, Urine: 1.007 (ref 1.001–1.035)
Squamous Epithelial / LPF: NONE SEEN [HPF] (ref <=5)
WBC, UA: NONE SEEN WBC/HPF (ref <=5)
Yeast: NONE SEEN [HPF]
pH: 6 (ref 5.0–8.0)

## 2016-04-04 LAB — VITAMIN D 25 HYDROXY (VIT D DEFICIENCY, FRACTURES): Vit D, 25-Hydroxy: 14 ng/mL — ABNORMAL LOW (ref 30–100)

## 2016-04-05 ENCOUNTER — Telehealth: Payer: Self-pay

## 2016-04-05 NOTE — Telephone Encounter (Signed)
Called patient. Gave lab results. Patient verbalized understanding.  

## 2016-04-05 NOTE — Telephone Encounter (Signed)
-----   Message from Dorena Dew, [place] sent at 04/05/2016  9:38 AM EDT ----- Regarding: lab results Please inform patient that platelet count continues to be decreased. Please look for signs of spontaneous bleeding, blood in urine or stool, fatigue, or easy bruising. Please follow up with oncologist as scheduled.     Thanks ----- Message -----    From: [facility]    Sent: 04/04/2016   4:02 AM      To: Dorena Dew, FNP

## 2016-04-10 ENCOUNTER — Other Ambulatory Visit: Payer: Self-pay | Admitting: Family Medicine

## 2016-04-10 DIAGNOSIS — L299 Pruritus, unspecified: Secondary | ICD-10-CM

## 2016-04-10 MED ORDER — HYDROXYZINE HCL 10 MG PO TABS: 10.0000 mg | ORAL_TABLET | Freq: Three times a day (TID) | ORAL | Status: DC | PRN

## 2016-04-29 ENCOUNTER — Telehealth: Payer: Self-pay

## 2016-04-29 NOTE — Telephone Encounter (Signed)
Patient is requesting Oxycodone, medication was discontinued on 12/16/2014. Patient will need an appointment to discuss uncontrolled pain management.    Jeffrey Dew, FNP

## 2016-04-29 NOTE — Telephone Encounter (Signed)
Refill request for oxycodone. LOV 04/03/2016. Please advise. Thanks!

## 2016-04-29 NOTE — Telephone Encounter (Signed)
Patient has appointment on 05/16/2016, there are no sooner appointments. He will keep this one. Thanks!

## 2016-05-14 ENCOUNTER — Other Ambulatory Visit: Payer: Self-pay | Admitting: Family Medicine

## 2016-05-16 ENCOUNTER — Ambulatory Visit (INDEPENDENT_AMBULATORY_CARE_PROVIDER_SITE_OTHER): Payer: Medicaid Other | Admitting: Family Medicine

## 2016-05-16 ENCOUNTER — Encounter: Payer: Self-pay | Admitting: Family Medicine

## 2016-05-16 VITALS — BP 137/67 | HR 51 | Temp 98.2°F | Resp 16 | Ht 68.0 in | Wt 169.0 lb

## 2016-05-16 DIAGNOSIS — D649 Anemia, unspecified: Secondary | ICD-10-CM | POA: Diagnosis not present

## 2016-05-16 DIAGNOSIS — G629 Polyneuropathy, unspecified: Secondary | ICD-10-CM | POA: Diagnosis not present

## 2016-05-16 DIAGNOSIS — L299 Pruritus, unspecified: Secondary | ICD-10-CM | POA: Diagnosis not present

## 2016-05-16 DIAGNOSIS — R21 Rash and other nonspecific skin eruption: Secondary | ICD-10-CM

## 2016-05-16 LAB — CBC WITH DIFFERENTIAL/PLATELET
Basophils Absolute: 0 cells/uL (ref 0–200)
Basophils Relative: 0 %
Eosinophils Absolute: 276 cells/uL (ref 15–500)
Eosinophils Relative: 6 %
HCT: 39.1 % (ref 38.5–50.0)
Hemoglobin: 12.2 g/dL — ABNORMAL LOW (ref 13.2–17.1)
Lymphocytes Relative: 29 %
Lymphs Abs: 1334 cells/uL (ref 850–3900)
MCH: 24.6 pg — ABNORMAL LOW (ref 27.0–33.0)
MCHC: 31.2 g/dL — ABNORMAL LOW (ref 32.0–36.0)
MCV: 78.8 fL — ABNORMAL LOW (ref 80.0–100.0)
Monocytes Absolute: 506 cells/uL (ref 200–950)
Monocytes Relative: 11 %
Neutro Abs: 2484 cells/uL (ref 1500–7800)
Neutrophils Relative %: 54 %
Platelets: 66 10*3/uL — ABNORMAL LOW (ref 140–400)
RBC: 4.96 MIL/uL (ref 4.20–5.80)
RDW: 21.6 % — ABNORMAL HIGH (ref 11.0–15.0)
WBC: 4.6 10*3/uL (ref 3.8–10.8)

## 2016-05-16 MED ORDER — GABAPENTIN 100 MG PO CAPS: ORAL_CAPSULE | ORAL | Status: DC

## 2016-05-16 MED ORDER — HYDROCORTISONE 1 % EX LOTN: 1.0000 "application " | TOPICAL_LOTION | Freq: Two times a day (BID) | CUTANEOUS | Status: DC

## 2016-05-16 MED ORDER — HYDROXYZINE HCL 10 MG PO TABS: 10.0000 mg | ORAL_TABLET | Freq: Three times a day (TID) | ORAL | Status: DC | PRN

## 2016-05-16 NOTE — Progress Notes (Signed)
Subjective:    Patient ID: Jeffrey Snow, male    DOB: 1956-03-05, 60 y.o.   MRN: [id number]  HPI [name], a 60 year old male with a history of hypertension and hepatitis C presents for follow up of a generalized rash. Patient complains of generalized rash that has been present over the past several months.  Appearance of rash at onset was red and generalized.  Rash has not changed over time. Discomfort associated with rash: is pruritic. Denies: abdominal pain, arthralgia, congestion, cough, crankiness, decrease in appetite, decrease in energy level, fever, headache, irritability, myalgia, nausea and sore throat. Patient has not had previous evaluation of rash. Jeffrey Snow has a history of scabies. Patient has had previous treatment, with unsatisfactory results. He was treated with permethrin and has had house exterminated as discussed during last visit.  Patient has not had contacts with similar rash. Patient has not identified precipitant. Patient denies new exposures soaps, lotions, laundry detergents, foods, medications, plants, insects or animals. Patient has a history of hepatitis C. He was under the care of hepatologist. He completed Harvoni therapy successfully several months ago. He continues to follow up with oncologist twice yearly for history of hepatocellular carcinoma.   Jeffrey Snow is complaining of burning pain to right foot. He states that pain has been present for greater than 1 year. Pain is worsened by walking long distances and is minimally relieved by rest. He says that pain is unrelieved by OTC analgesics.    Past Medical History  Diagnosis Date  . Depression   . Anxiety   . Panic attack   . Hypertension   . Hepatitis C   . Claudication of left lower extremity (HCC) m-1  . Abnormal MRI of abdomen, liver  04/21/2014  . Cirrhosis, alcoholic ([facility]) [id number]  . Liver mass   . Harrison (hepatocellular carcinoma) ([place])    Social History   Social History  .  Marital Status: Single    Spouse Name: N/A  . Number of Children: 3  . Years of Education: N/A   Occupational History  .  [employer]   Social History Main Topics  . Smoking status: Former Smoker -- 0.50 packs/day    Quit date: 08/21/2011  . Smokeless tobacco: Never Used  . Alcohol Use: No     Comment: Occasional beer on the weekends  . Drug Use: No     Comment: abused cocaine in the 80's  . Sexual Activity: No   Other Topics Concern  . Not on file   Social History Narrative   Single, never married.  Lives alone.  3 daughters, 16 grandchildren who are not very involved in the patient's care.  Quit smoking and heavy drinking 3.5 years ago.    Allergies  Allergen Reactions  . Folic Acid Other (See Comments)    Makes stomach feel funny   Immunization History  Administered Date(s) Administered  . Influenza,inj,Quad PF,36+ Mos 12/20/2013, 12/21/2014, 10/09/2015  . Tdap 01/27/2014   Review of Systems  Constitutional: Positive for fatigue.  HENT: Negative.   Eyes: Negative.  Negative for visual disturbance.  Respiratory: Negative.   Cardiovascular: Negative.  Negative for chest pain, palpitations and leg swelling.  Gastrointestinal: Negative for vomiting and diarrhea.  Endocrine: Negative.  Negative for polydipsia, polyphagia and polyuria.  Genitourinary: Negative.  Negative for frequency, hematuria and decreased urine volume.  Musculoskeletal: Negative.        Right foot pain  Skin: Positive for rash.  Neurological: Positive  for numbness. Negative for dizziness and weakness.  Hematological: Negative.   Psychiatric/Behavioral: Positive for agitation. Negative for suicidal ideas and sleep disturbance. The patient is nervous/anxious.        Objective:   Physical Exam  Constitutional: He is oriented to person, place, and time. He appears well-developed and well-nourished.  HENT:  Head: Normocephalic and atraumatic.  Right Ear: External ear normal.  Left Ear: External  ear normal.  Mouth/Throat: Oropharynx is clear and moist.  Eyes: Conjunctivae are normal. Pupils are equal, round, and reactive to light.  Neck: Normal range of motion. Neck supple.  Cardiovascular: Normal rate, regular rhythm, normal heart sounds and intact distal pulses.   Pulmonary/Chest: Effort normal.  Abdominal: Soft. Bowel sounds are normal.  Musculoskeletal:       Right ankle: He exhibits normal range of motion, no swelling and normal pulse. Tenderness.  Neurological: He is alert and oriented to person, place, and time. He has normal reflexes.  Skin: Skin is dry and intact. Rash noted. Rash is maculopapular. He is not diaphoretic. No cyanosis. Nails show no clubbing.  Generalized, erythematous, pruritis, maculopapular rash   Psychiatric: His speech is normal. Judgment normal. His mood appears anxious. He expresses no homicidal and no suicidal ideation.        BP 137/67 mmHg  Pulse 51  Temp(Src) 98.2 F (36.8 C) (Oral)  Resp 16  Ht 5\' 8"  (1.727 m)  Wt 169 lb (76.658 kg)  BMI 25.70 kg/m2  SpO2 98% Assessment & Plan:  1. Rash and nonspecific skin eruption Patient discussed rash at length. He has had house exterminated and has been treated with Permethrin cream. I will send a referral to dermatology for further treatment and evaluation.   - hydrocortisone 1 % lotion; Apply 1 application topically 2 (two) times daily.  Dispense: 118 mL; Refill: 0 - Ambulatory referral to Dermatology  2. Pruritic condition - hydrOXYzine (ATARAX/VISTARIL) 10 MG tablet; Take 1 tablet (10 mg total) by mouth 3 (three) times daily as needed.  Dispense: 60 tablet; Refill: 0 - hydrocortisone 1 % lotion; Apply 1 application topically 2 (two) times daily.  Dispense: 118 mL; Refill: 0  3. Neuropathy [facility]) Patient continues to complaining of burning pain to right foot. - gabapentin (NEURONTIN) 100 MG capsule; TAKE 1 CAPSULE(100 MG) BY MOUTH THREE TIMES DAILY  Dispense: 90 capsule; Refill: 2  4.  Anemia, unspecified anemia type Patient has a history of thrombocytopenia. - CBC with Differential      RTC: Will follow up by phone with laboratory results. Also, follow up in 6 months for chronic conditions.   Dorena Dew, FNP

## 2016-05-16 NOTE — Patient Instructions (Signed)

## 2016-05-31 ENCOUNTER — Other Ambulatory Visit: Payer: Self-pay | Admitting: Family Medicine

## 2016-06-03 ENCOUNTER — Telehealth: Payer: Self-pay

## 2016-06-03 NOTE — Telephone Encounter (Signed)
Pt is requesting a medication refill for Hydroskin lotion 1%. Thanks!

## 2016-06-03 NOTE — Telephone Encounter (Signed)
This has been refilled.  Thanks. 

## 2016-06-14 ENCOUNTER — Other Ambulatory Visit: Payer: Self-pay | Admitting: Family Medicine

## 2016-07-02 ENCOUNTER — Other Ambulatory Visit (HOSPITAL_COMMUNITY): Payer: Self-pay | Admitting: Interventional Radiology

## 2016-07-02 DIAGNOSIS — C220 Liver cell carcinoma: Secondary | ICD-10-CM

## 2016-07-04 ENCOUNTER — Other Ambulatory Visit: Payer: Self-pay | Admitting: Radiology

## 2016-07-04 DIAGNOSIS — C220 Liver cell carcinoma: Secondary | ICD-10-CM

## 2016-07-08 LAB — PROTIME-INR
INR: 1.1
Prothrombin Time: 12.1 s — ABNORMAL HIGH (ref 9.0–11.5)

## 2016-07-09 LAB — COMPREHENSIVE METABOLIC PANEL
ALT: 20 U/L (ref 9–46)
AST: 20 U/L (ref 10–35)
Albumin: 3.7 g/dL (ref 3.6–5.1)
Alkaline Phosphatase: 139 U/L — ABNORMAL HIGH (ref 40–115)
BUN: 11 mg/dL (ref 7–25)
CO2: 25 mmol/L (ref 20–31)
Calcium: 8.8 mg/dL (ref 8.6–10.3)
Chloride: 103 mmol/L (ref 98–110)
Creat: 1.04 mg/dL (ref 0.70–1.25)
Glucose, Bld: 85 mg/dL (ref 65–99)
Potassium: 3.5 mmol/L (ref 3.5–5.3)
Sodium: 136 mmol/L (ref 135–146)
Total Bilirubin: 0.6 mg/dL (ref 0.2–1.2)
Total Protein: 6.5 g/dL (ref 6.1–8.1)

## 2016-07-09 LAB — CBC
HCT: 44.4 % (ref 38.5–50.0)
Hemoglobin: 14.5 g/dL (ref 13.2–17.1)
MCH: 28.6 pg (ref 27.0–33.0)
MCHC: 32.7 g/dL (ref 32.0–36.0)
MCV: 87.6 fL (ref 80.0–100.0)
Platelets: 54 10*3/uL — ABNORMAL LOW (ref 140–400)
RBC: 5.07 MIL/uL (ref 4.20–5.80)
RDW: 21.5 % — ABNORMAL HIGH (ref 11.0–15.0)
WBC: 4.5 10*3/uL (ref 3.8–10.8)

## 2016-07-09 LAB — AFP TUMOR MARKER: AFP-Tumor Marker: 25.7 ng/mL — ABNORMAL HIGH (ref <6.1)

## 2016-07-12 ENCOUNTER — Other Ambulatory Visit: Payer: Self-pay | Admitting: Family Medicine

## 2016-07-23 ENCOUNTER — Ambulatory Visit (HOSPITAL_COMMUNITY)
Admission: RE | Admit: 2016-07-23 | Discharge: 2016-07-23 | Disposition: A | Discharge status: Home / Self Care | Payer: Medicaid Other | Source: Ambulatory Visit | Attending: Interventional Radiology | Admitting: Interventional Radiology

## 2016-07-23 ENCOUNTER — Ambulatory Visit
Admission: RE | Admit: 2016-07-23 | Discharge: 2016-07-23 | Disposition: A | Discharge status: Home / Self Care | Payer: Medicaid Other | Source: Ambulatory Visit | Attending: Interventional Radiology | Admitting: Interventional Radiology

## 2016-07-23 DIAGNOSIS — C220 Liver cell carcinoma: Secondary | ICD-10-CM

## 2016-07-23 DIAGNOSIS — K760 Fatty (change of) liver, not elsewhere classified: Secondary | ICD-10-CM | POA: Insufficient documentation

## 2016-07-23 DIAGNOSIS — R161 Splenomegaly, not elsewhere classified: Secondary | ICD-10-CM | POA: Insufficient documentation

## 2016-07-23 DIAGNOSIS — I8500 Esophageal varices without bleeding: Secondary | ICD-10-CM | POA: Insufficient documentation

## 2016-07-23 HISTORY — PX: IR GENERIC HISTORICAL: IMG1180011

## 2016-07-23 MED ORDER — GADOXETATE DISODIUM 0.25 MMOL/ML IV SOLN: 7.0000 mL | Freq: Once | INTRAVENOUS | Status: DC | PRN

## 2016-07-23 NOTE — Progress Notes (Signed)
Chief Complaint: Patient was seen in follow-up today for  Chief Complaint  Patient presents with  . Follow-up    2 yr follow up DEB-TACE/MWA of Hepatocellular Carcinoma of Right Lobe of Liver   at the request of [place]  Referring Physician(s): Jeffrey Snow  History of Present Illness: Jeffrey Snow is a 60 y.o. male alcoholic and HCV cirrhosis complicated by hepatocellular carcinoma, ascites and esophageal varices. He was treated by combination drug-eluting bead transarterial chemoembolization followed by microwave thermal ablation on 6/25 and 06/10/2014. Follow-up surveillance imaging has demonstrated a complete response to therapy to this point.   Jeffrey Snow presents today for his 24 month follow-up evaluation. His MRI today again shows continued contraction of the prior treatment site with no evidence of nodularity or enhancement to suggest residual, recurrent or new hepatocellular carcinoma.  Clinically, Jeffrey Snow continues to do Extremely well. His ascites is very well managed. I see almost no ascitic fluid on his abdominal MRI and he has no current complaints of abdominal swelling or fluid. His energy level is good.   He has completed his Harvoni therapy.  He continues to maintain his sobriety and is now 2 years sober which is fantastic.  Additionally, he has not smoked for the same duration.  He continues to follow a low-sodium diet.   Past Medical History:  Diagnosis Date  . Abnormal MRI of abdomen, liver  04/21/2014  . Anxiety   . Cirrhosis, alcoholic ([place]) 123456  . Claudication of left lower extremity (HCC) m-1  . Depression   . [place] (hepatocellular carcinoma) ([place])   . Hepatitis C   . Hypertension   . Liver mass   . Panic attack     Past Surgical History:  Procedure Laterality Date  . ABDOMINAL AORTAGRAM N/A 12/27/2013   Procedure: ABDOMINAL Maxcine Ham;  Surgeon: Jeffrey Venice, MD;  Location: [facility] CATH LAB;  Service: Cardiovascular;   Laterality: N/A;  . AORTOGRAM    . COSMETIC SURGERY     facial reconstructive surgery  . ESOPHAGOGASTRODUODENOSCOPY N/A 12/17/2014   Procedure: ESOPHAGOGASTRODUODENOSCOPY (EGD);  Surgeon: Jeffrey Sabins, MD;  Location: [facility] ENDOSCOPY;  Service: Endoscopy;  Laterality: N/A;  . ESOPHAGOGASTRODUODENOSCOPY N/A 09/05/2015   Procedure: ESOPHAGOGASTRODUODENOSCOPY (EGD);  Surgeon: Jeffrey Horner, MD;  Location: [facility] ENDOSCOPY;  Service: Endoscopy;  Laterality: N/A;  . LOWER EXTREMITY ANGIOGRAM Bilateral 12/27/2013   Procedure: LOWER EXTREMITY ANGIOGRAM;  Surgeon: Jeffrey [place], MD;  Location: [facility] CATH LAB;  Service: Cardiovascular;  Laterality: Bilateral;    Allergies: Folic acid  Medications: Prior to Admission medications   Medication Sig Start Date End Date Taking? Authorizing Provider  ALPRAZolam Jeffrey Snow) 0.5 MG tablet Take 0.5 mg by mouth 2 (two) times daily.    Yes Historical Provider, MD  bisacodyl (DULCOLAX) 5 MG EC tablet Take 5 mg by mouth daily as needed for mild constipation or moderate constipation. Reported on 04/03/2016   Yes Historical Provider, MD  docusate sodium 100 MG CAPS Take 100 mg by mouth 2 (two) times daily. 06/11/14  Yes Jeffrey Dike, PA-C  ferrous sulfate 325 (65 FE) MG EC tablet Take 325 mg by mouth 2 (two) times daily.   Yes Historical Provider, MD  FLUoxetine (PROZAC) 40 MG capsule Take 1 capsule by mouth every morning. 05/02/15  Yes Historical Provider, MD  furosemide (LASIX) 20 MG tablet TAKE 1 TABLET BY MOUTH EVERY DAY 01/15/16  Yes Jeffrey Dew, FNP  gabapentin (NEURONTIN) 100 MG capsule TAKE 1 CAPSULE(100 MG) BY  MOUTH THREE TIMES DAILY 05/16/16  Yes Jeffrey Dew, FNP  hydrOXYzine (ATARAX/VISTARIL) 10 MG tablet TAKE 1 TABLET(10 MG) BY MOUTH THREE TIMES DAILY AS NEEDED 07/12/16  Yes Jeffrey Chapman, NP  pantoprazole (PROTONIX) 40 MG tablet Take 1 tablet (40 mg total) by mouth 2 (two) times daily before a meal. 09/09/15  Yes Jeffrey Filler, MD  spironolactone (ALDACTONE)  25 MG tablet Take 3 tablets (75 mg total) by mouth every morning. Resume in 2-3 days. 09/12/15  Yes Jeffrey Filler, MD  zolpidem (AMBIEN) 10 MG tablet Take 10 mg by mouth at bedtime as needed for sleep.    Yes Historical Provider, MD  HYDROSKIN 1 % lotion APPLY EXTERNALLY TO THE AFFECTED AREA TWICE DAILY Patient not taking: Reported on 07/23/2016 06/03/16   Jeffrey Dew, FNP  HYDROSKIN 1 % lotion APPLY EXTERNALLY TO THE AFFECTED AREA TWICE DAILY Patient not taking: Reported on 07/23/2016 07/12/16   Jeffrey Chapman, NP  permethrin (ACTICIN) 5 % cream Apply 1 application topically once. Patient not taking: Reported on 05/16/2016 04/03/16   Jeffrey Dew, FNP  polyethylene glycol (MIRALAX / Floria Raveling) packet Take 17 g by mouth daily. Patient not taking: Reported on 05/16/2016 09/09/15   Jeffrey Filler, MD     Family History  Problem Relation Age of Onset  . Diabetes Father   . Diabetes Brother   . Diabetes Brother     Social History   Social History  . Marital status: Single    Spouse name: N/A  . Number of children: 3  . Years of education: N/A   Occupational History  .  [employer]   Social History Main Topics  . Smoking status: Former Smoker    Packs/day: 0.50    Quit date: 08/21/2011  . Smokeless tobacco: Never Used  . Alcohol use No     Comment: Occasional beer on the weekends  . Drug use: No     Comment: abused cocaine in the 80's  . Sexual activity: No   Other Topics Concern  . Not on file   Social History Narrative   Single, never married.  Lives alone.  3 daughters, 16 grandchildren who are not very involved in the patient's care.  Quit smoking and heavy drinking 3.5 years ago.     ECOG Status: 0 - Asymptomatic  Review of Systems: A 12 point ROS discussed and pertinent positives are indicated in the HPI above.  All other systems are negative.  Review of Systems  Vital Signs: BP 138/72 (BP Location: Left Arm, Patient Position: Sitting, Cuff Size: Normal)    Pulse (!) 56   Temp 98 F (36.7 C) (Oral)   Resp 14   Ht 5' 8.5" (1.74 m)   Wt 168 lb (76.2 kg)   SpO2 97%   BMI 25.17 kg/m   Physical Exam  Constitutional: He is oriented to person, place, and time. He appears well-developed and well-nourished. No distress.  HENT:  Head: Normocephalic and atraumatic.  Eyes: No scleral icterus.  Cardiovascular: Normal rate and regular rhythm.   Pulmonary/Chest: Effort normal.  Abdominal: Soft. He exhibits no distension. There is no tenderness.  Neurological: He is alert and oriented to person, place, and time.  Skin: Skin is warm and dry.  Psychiatric: He has a normal mood and affect. His behavior is normal.  Nursing note and vitals reviewed.   Mallampati Score:     Imaging: Mr Abdomen W Wo Contrast  Result Date: 07/23/2016  CLINICAL DATA:  60 year old male with history of cirrhosis with prior history of hepatocellular carcinoma of the right lobe of the liver treated with combined DEB-TACE (drug-eluting bead transarterial chemoembolization) and percutaneous thermal ablation in 2015. Followup evaluation. EXAM: MRI ABDOMEN WITHOUT AND WITH CONTRAST TECHNIQUE: Multiplanar multisequence MR imaging of the abdomen was performed both before and after the administration of intravenous contrast. CONTRAST:  7 mL of Eovist. COMPARISON:  Abdominal MRI 12/20/2015. Multiple other prior examinations. FINDINGS: Lower chest:  Unremarkable. Hepatobiliary: The liver has a shrunken appearance and nodular contour, compatible with underlying cirrhosis. Diffuse loss of signal intensity throughout the hepatic parenchyma on out of phase dual echo images, compatible with a background of mild hepatic steatosis. Centered in segment 7 of the liver there is again a well-defined 2.9 x 3.1 cm area that is heterogeneous in T1 and T2 signal intensity, predominantly slightly T1 hyperintense and T2 hypointense, with no appreciable internal enhancement, compatible with a stable post  ablation defect. Previously noted small hypervascular areas in the right lobe of the liver are no longer identified on today's examination (presumably perfusion anomalies on the prior study). No new hypervascular hepatic lesions are identified. No intra or extrahepatic biliary ductal dilatation. Small filling defects lie dependently in the gallbladder, compatible with gallstones. No evidence to suggest an acute cholecystitis at this time. Pancreas: No pancreatic mass. No pancreatic ductal dilatation. No pancreatic or peripancreatic fluid or inflammatory changes. Spleen: Spleen is enlarged measuring 13.3 x 6.7 x 15.7 cm (estimated splenic volume of 700 mL). Adrenals/Urinary Tract: Bilateral kidneys and bilateral adrenal glands are normal in appearance. No hydroureteronephrosis in the visualized abdomen. Stomach/Bowel: Visualized portions are unremarkable. Vascular/Lymphatic: No aneurysm identified in the visualized abdominal vasculature. Recannulized paraumbilical vein. Paraesophageal varices. Portal vein is patent. Borderline enlarged hepatoduodenal lymph node measuring 9 mm in short axis. Other: No significant volume of ascites in the visualized peritoneal cavity. Musculoskeletal: No aggressive osseous lesions are noted in the visualized portions of the skeleton. IMPRESSION: 1. Expected post procedural changes of prior ablation procedure for lesion in segment 7 of the liver, which appears grossly stable compared to the prior examination. No findings to suggest locally recurrent disease, and no evidence of metastatic disease in the abdomen on today's study. 2. Hepatic steatosis. 3. Splenomegaly. 4. Small portosystemic collateral pathways, including paraesophageal varices. 5. Additional incidental findings, as above. Electronically Signed   By: Vinnie Langton M.D.   On: 07/23/2016 13:10    Labs:  CBC:  Recent Labs  12/04/15 1412 04/03/16 1107 05/16/16 1031 07/08/16 1053  WBC 4.7 3.9 4.6 4.5  HGB 11.1*  11.2* 12.2* 14.5  HCT 36.7* 37.5* 39.1 44.4  PLT 75* 55* 66* 54*    COAGS:  Recent Labs  08/05/15 1137 09/05/15 0746 12/04/15 1412 07/08/16 1053  INR 1.22 1.55* 1.17 1.1    BMP:  Recent Labs  09/07/15 0400 09/08/15 0540 12/04/15 1412 04/03/16 1107 07/08/16 1053  NA 141 141 138 138 136  K 4.1 3.6 3.6 4.0 3.5  CL 115* 113* 103 102 103  CO2 21* 22 29 24 25   GLUCOSE 113* 119* 59* 68 85  BUN 24* 11 9 10 11   CALCIUM 8.4* 8.0* 9.0 9.0 8.8  CREATININE 1.01 0.96 0.83 0.98 1.04  GFRNONAA >60 >60 >89 83  --   GFRAA >60 >60 >89 >89  --     LIVER FUNCTION TESTS:  Recent Labs  09/06/15 0610 12/04/15 1412 04/03/16 1107 07/08/16 1053  BILITOT 1.1 0.6 0.8 0.6  AST 34 13 14 20   ALT 24 12 10 20   ALKPHOS 64 122* 145* 139*  PROT 4.9* 7.0 7.0 6.5  ALBUMIN 2.8* 3.8 4.1 3.7    TUMOR MARKERS:  Recent Labs  07/08/16 1053  AFPTM 25.7*    Assessment and Plan:  Mr. Coniglio is now 2 years status post combined chemoembolization and microwave ablation of his hepatocellular carcinoma. His disease remains in remission.  Furthermore, he has completed treatment for his underlying hepatitis C and has completely changed his personal lifestyle. He has not smoked or drank alcohol in 2 years.  Mr. Rill is an amazing success story.  1.) Return to clinic in 6 months with repeat MRI of the abdomen with Eovist contrast, liver labs and serum AFP.  Electronically Signed: Jacqulynn Cadet 07/23/2016, 5:16 PM   I spent a total of  15 Minutes in face to face in clinical consultation, greater than 50% of which was counseling/coordinating care for hepatocellular cancer

## 2016-08-04 ENCOUNTER — Other Ambulatory Visit: Payer: Self-pay | Admitting: Family Medicine

## 2016-08-04 DIAGNOSIS — L299 Pruritus, unspecified: Secondary | ICD-10-CM

## 2016-08-28 ENCOUNTER — Other Ambulatory Visit: Payer: Self-pay | Admitting: Family Medicine

## 2016-08-28 DIAGNOSIS — R21 Rash and other nonspecific skin eruption: Secondary | ICD-10-CM

## 2016-09-02 ENCOUNTER — Other Ambulatory Visit: Payer: Self-pay

## 2016-09-02 DIAGNOSIS — L299 Pruritus, unspecified: Secondary | ICD-10-CM

## 2016-09-02 MED ORDER — HYDROXYZINE HCL 10 MG PO TABS: ORAL_TABLET | ORAL | 3 refills | Status: DC

## 2016-09-02 NOTE — Telephone Encounter (Signed)
Refill for hydroxyzine sent into pharmacy. Thanks!  

## 2016-09-23 ENCOUNTER — Other Ambulatory Visit: Payer: Self-pay | Admitting: Family Medicine

## 2016-09-23 DIAGNOSIS — G629 Polyneuropathy, unspecified: Secondary | ICD-10-CM

## 2016-11-04 ENCOUNTER — Other Ambulatory Visit: Payer: Self-pay | Admitting: Family Medicine

## 2016-11-04 ENCOUNTER — Telehealth: Payer: Self-pay | Admitting: Family Medicine

## 2016-11-04 DIAGNOSIS — Z1211 Encounter for screening for malignant neoplasm of colon: Secondary | ICD-10-CM

## 2016-11-04 NOTE — Telephone Encounter (Signed)
Advised patient that his referral to Dr [facility] had been done and that he could call them to make his appointment. I spoke with Jeffrey Snow and he will call her for appointment.

## 2016-11-05 ENCOUNTER — Telehealth: Payer: Self-pay

## 2016-11-05 ENCOUNTER — Other Ambulatory Visit: Payer: Self-pay | Admitting: Family Medicine

## 2016-11-05 DIAGNOSIS — R21 Rash and other nonspecific skin eruption: Secondary | ICD-10-CM

## 2016-11-05 MED ORDER — PERMETHRIN 5 % EX CREA: TOPICAL_CREAM | CUTANEOUS | 0 refills | Status: DC

## 2016-11-05 NOTE — Telephone Encounter (Signed)
Done

## 2016-11-05 NOTE — Telephone Encounter (Signed)
Vaughan Basta can this be done? Please advise. Thanks!

## 2016-11-16 ENCOUNTER — Other Ambulatory Visit: Payer: Self-pay | Admitting: Family Medicine

## 2016-11-18 ENCOUNTER — Ambulatory Visit: Payer: Self-pay | Admitting: Family Medicine

## 2016-11-26 ENCOUNTER — Other Ambulatory Visit: Payer: Self-pay

## 2016-11-26 DIAGNOSIS — G629 Polyneuropathy, unspecified: Secondary | ICD-10-CM

## 2016-11-26 MED ORDER — GABAPENTIN 100 MG PO CAPS: ORAL_CAPSULE | ORAL | 0 refills | Status: DC

## 2016-12-18 ENCOUNTER — Other Ambulatory Visit: Payer: Self-pay

## 2016-12-18 ENCOUNTER — Other Ambulatory Visit: Payer: Self-pay | Admitting: Family Medicine

## 2016-12-18 ENCOUNTER — Encounter: Payer: Self-pay | Admitting: Interventional Radiology

## 2016-12-18 DIAGNOSIS — G629 Polyneuropathy, unspecified: Secondary | ICD-10-CM

## 2016-12-18 DIAGNOSIS — L299 Pruritus, unspecified: Secondary | ICD-10-CM

## 2016-12-18 MED ORDER — GABAPENTIN 100 MG PO CAPS: ORAL_CAPSULE | ORAL | 3 refills | Status: DC

## 2016-12-18 MED ORDER — HYDROXYZINE HCL 10 MG PO TABS: ORAL_TABLET | ORAL | 3 refills | Status: DC

## 2016-12-19 ENCOUNTER — Encounter: Payer: Self-pay | Admitting: Interventional Radiology

## 2016-12-20 ENCOUNTER — Encounter: Payer: Self-pay | Admitting: Interventional Radiology

## 2016-12-23 ENCOUNTER — Ambulatory Visit: Payer: Self-pay | Admitting: Family Medicine

## 2017-01-06 ENCOUNTER — Ambulatory Visit: Payer: Self-pay | Admitting: Family Medicine

## 2017-01-08 ENCOUNTER — Telehealth: Payer: Self-pay

## 2017-01-08 NOTE — Telephone Encounter (Signed)
Spoke with provider she advised ok to take benadryl. Called patient, advised to try benadryl and if rash is not gone in 1 week call back to scheduled and appointment. Thanks!

## 2017-01-13 ENCOUNTER — Telehealth: Payer: Self-pay | Admitting: Family Medicine

## 2017-01-13 NOTE — Telephone Encounter (Signed)
[place] said okay to fill Lasix, will need potassium checked at next visit please. He is on your schedule.

## 2017-01-22 ENCOUNTER — Other Ambulatory Visit: Payer: Self-pay | Admitting: Radiology

## 2017-01-22 ENCOUNTER — Other Ambulatory Visit (HOSPITAL_COMMUNITY): Payer: Self-pay | Admitting: Interventional Radiology

## 2017-01-22 DIAGNOSIS — C220 Liver cell carcinoma: Secondary | ICD-10-CM

## 2017-01-27 ENCOUNTER — Ambulatory Visit (INDEPENDENT_AMBULATORY_CARE_PROVIDER_SITE_OTHER): Payer: Medicaid Other | Admitting: Family Medicine

## 2017-01-27 ENCOUNTER — Encounter: Payer: Self-pay | Admitting: Family Medicine

## 2017-01-27 VITALS — BP 155/77 | HR 52 | Temp 98.3°F | Resp 12 | Ht 68.0 in | Wt 178.0 lb

## 2017-01-27 DIAGNOSIS — R21 Rash and other nonspecific skin eruption: Secondary | ICD-10-CM

## 2017-01-27 LAB — COMPREHENSIVE METABOLIC PANEL
ALT: 21 U/L (ref 9–46)
AST: 20 U/L (ref 10–35)
Albumin: 4.2 g/dL (ref 3.6–5.1)
Alkaline Phosphatase: 110 U/L (ref 40–115)
BUN: 14 mg/dL (ref 7–25)
CO2: 28 mmol/L (ref 20–31)
Calcium: 9.1 mg/dL (ref 8.6–10.3)
Chloride: 101 mmol/L (ref 98–110)
Creat: 1.15 mg/dL (ref 0.70–1.25)
Glucose, Bld: 63 mg/dL — ABNORMAL LOW (ref 65–99)
Potassium: 4.2 mmol/L (ref 3.5–5.3)
Sodium: 137 mmol/L (ref 135–146)
Total Bilirubin: 0.7 mg/dL (ref 0.2–1.2)
Total Protein: 7 g/dL (ref 6.1–8.1)

## 2017-01-27 NOTE — Progress Notes (Signed)
Jeffrey Snow, is a 61 y.o. male  [id number]  [id number]  DOB - [birth year]  CC:  Chief Complaint  Patient presents with  . Rash    itching sores on legs, arms, chest for 2 months has tried everything and can not get rid of it       HPI: Jeffrey Snow is a 61 y.o. male here for follow-up for chronic rash. He reports it has been for two months. But I see in his chart that he was treated for this much earlier that that and was referred to dermatology but was not seen as they did not take medicaid. He reports the rash does not currently itch.   Allergies  Allergen Reactions  . Folic Acid Other (See Comments)    Makes stomach feel funny   Past Medical History:  Diagnosis Date  . Abnormal MRI of abdomen, liver  04/21/2014  . Anxiety   . Cirrhosis, alcoholic ([place]) 123456  . Claudication of left lower extremity (HCC) m-1  . Depression   . [place] (hepatocellular carcinoma) ([place])   . Hepatitis C   . Hypertension   . Liver mass   . Panic attack    Current Outpatient Prescriptions on File Prior to Visit  Medication Sig Dispense Refill  . ALPRAZolam (XANAX) 0.5 MG tablet Take 0.5 mg by mouth 2 (two) times daily.     . bisacodyl (DULCOLAX) 5 MG EC tablet Take 5 mg by mouth daily as needed for mild constipation or moderate constipation. Reported on 04/03/2016    . ferrous sulfate 325 (65 FE) MG EC tablet Take 325 mg by mouth 2 (two) times daily.    Marland Kitchen FLUoxetine (PROZAC) 40 MG capsule Take 1 capsule by mouth every morning.  2  . furosemide (LASIX) 20 MG tablet TAKE 1 TABLET BY MOUTH EVERY DAY 30 tablet 0  . gabapentin (NEURONTIN) 100 MG capsule TAKE 1 CAPSULE(100 MG) BY MOUTH THREE TIMES DAILY 90 capsule 3  . hydrOXYzine (ATARAX/VISTARIL) 10 MG tablet TAKE 1 TABLET(10 MG) BY MOUTH THREE TIMES DAILY AS NEEDED 60 tablet 3  . pantoprazole (PROTONIX) 40 MG tablet Take 1 tablet (40 mg total) by mouth 2 (two) times daily before a meal. 62 tablet 0  . spironolactone (ALDACTONE) 25 MG  tablet Take 3 tablets (75 mg total) by mouth every morning. Resume in 2-3 days.    Marland Kitchen docusate sodium 100 MG CAPS Take 100 mg by mouth 2 (two) times daily. (Patient not taking: Reported on 01/27/2017) 20 capsule 0  . HYDROSKIN 1 % lotion APPLY EXTERNALLY TO THE AFFECTED AREA TWICE DAILY (Patient not taking: Reported on 07/23/2016) 30 mL 0  . HYDROSKIN 1 % lotion APPLY EXTERNALLY TO THE AFFECTED AREA TWICE DAILY (Patient not taking: Reported on 07/23/2016) 120 mL 0  . hydrOXYzine (ATARAX/VISTARIL) 10 MG tablet TAKE 1 TABLET(10 MG) BY MOUTH THREE TIMES DAILY AS NEEDED (Patient not taking: Reported on 01/27/2017) 60 tablet 0  . permethrin (ELIMITE) 5 % cream APPLY EXTERNALLY TO THE AFFECTED AREA 1 TIME (Patient not taking: Reported on 01/27/2017) 60 g 0  . polyethylene glycol (MIRALAX / GLYCOLAX) packet Take 17 g by mouth daily. (Patient not taking: Reported on 05/16/2016) 14 each 0  . zolpidem (AMBIEN) 10 MG tablet Take 10 mg by mouth at bedtime as needed for sleep.      No current facility-administered medications on file prior to visit.    Family History  Problem Relation Age of Onset  . Diabetes Father   .  Diabetes Brother   . Diabetes Brother    Social History   Social History  . Marital status: Single    Spouse name: N/A  . Number of children: 3  . Years of education: N/A   Occupational History  .  [employer]   Social History Main Topics  . Smoking status: Former Smoker    Packs/day: 0.50    Quit date: 08/21/2011  . Smokeless tobacco: Never Used  . Alcohol use No     Comment: Occasional beer on the weekends  . Drug use: No     Comment: abused cocaine in the 80's  . Sexual activity: No   Other Topics Concern  . Not on file   Social History Narrative   Single, never married.  Lives alone.  3 daughters, 16 grandchildren who are not very involved in the patient's care.  Quit smoking and heavy drinking 3.5 years ago.     Review of Systems: Constitutional: Negative Skin: + for rash  and itching.   Objective:   Vitals:   01/27/17 1127  BP: (!) 150/68  Pulse: (!) 52  Resp: 12  Temp: 98.3 F (36.8 C)    Physical Exam: Constitutional: Patient appears well-developed and well-nourished. No distress. Musculoskeletal: Normal range of motion. No edema and no tenderness.  Neuro: Alert.Normal muscle tone coordination. Non-focal Skin: Skin is warm and dry. + for maculopapular rash scattered over trunk and extremeties. Many are excoriated from scratching This rash is not confluent. He also has a petechial rash on his lower legs consistent with PVD. Psychiatric: Normal mood and affect. Behavior, judgment, thought content normal.  Lab Results  Component Value Date   WBC 4.5 07/08/2016   HGB 14.5 07/08/2016   HCT 44.4 07/08/2016   MCV 87.6 07/08/2016   PLT 54 (L) 07/08/2016   Lab Results  Component Value Date   CREATININE 1.04 07/08/2016   BUN 11 07/08/2016   NA 136 07/08/2016   K 3.5 07/08/2016   CL 103 07/08/2016   CO2 25 07/08/2016    Lab Results  Component Value Date   HGBA1C 5.6 06/01/2015   Lipid Panel     Component Value Date/Time   CHOL 113 01/26/2015 0834   TRIG 46 01/26/2015 0834   HDL 35 (L) 01/26/2015 0834   CHOLHDL 3.2 01/26/2015 0834   VLDL 9 01/26/2015 0834   LDLCALC 69 01/26/2015 0834        Assessment and plan:   1. Rash and nonspecific skin eruption - Punch biopsy.    PRN follow-up The patient was given clear instructions to go to ER or return to medical center if symptoms don't improve, worsen or new problems develop. The patient verbalized understanding.    Micheline Chapman FNP  01/27/2017, 11:57 AM

## 2017-01-27 NOTE — Patient Instructions (Signed)
Will call with biopsy results

## 2017-01-28 LAB — CBC
HCT: 49.1 % (ref 38.5–50.0)
Hemoglobin: 16.4 g/dL (ref 13.2–17.1)
MCH: 30.2 pg (ref 27.0–33.0)
MCHC: 33.4 g/dL (ref 32.0–36.0)
MCV: 90.4 fL (ref 80.0–100.0)
Platelets: 53 10*3/uL — ABNORMAL LOW (ref 140–400)
RBC: 5.43 MIL/uL (ref 4.20–5.80)
RDW: 14.7 % (ref 11.0–15.0)
WBC: 4.1 10*3/uL (ref 3.8–10.8)

## 2017-01-28 LAB — AFP TUMOR MARKER: AFP-Tumor Marker: 24.9 ng/mL — ABNORMAL HIGH (ref <6.1)

## 2017-01-28 LAB — PROTIME-INR
INR: 1.1
Prothrombin Time: 12 s — ABNORMAL HIGH (ref 9.0–11.5)

## 2017-02-03 ENCOUNTER — Other Ambulatory Visit: Payer: Self-pay | Admitting: Family Medicine

## 2017-02-03 ENCOUNTER — Telehealth: Payer: Self-pay | Admitting: Family Medicine

## 2017-02-03 DIAGNOSIS — R21 Rash and other nonspecific skin eruption: Secondary | ICD-10-CM

## 2017-02-03 NOTE — Telephone Encounter (Signed)
Reported derma path to patient and advised him he needs a dermatology referral for evaluation and treatment per Sharon Seller, NP . He faxed referral to [facility], Dr Daine Floras per patient request.

## 2017-02-03 NOTE — Telephone Encounter (Signed)
Patient received Jury Summons for 03/17/2017 and due to his vascular issues with his leg he feels he is physically unable to serve on a jury. He will need note with detailed explanation from you. Please let him know if you can do this. He does have a handicap sticker for his vehicle which he says he received from this office. Thank you

## 2017-02-10 ENCOUNTER — Encounter: Payer: Self-pay | Admitting: Family Medicine

## 2017-02-11 ENCOUNTER — Ambulatory Visit
Admission: RE | Admit: 2017-02-11 | Discharge: 2017-02-11 | Disposition: A | Discharge status: Home / Self Care | Payer: Medicaid Other | Source: Ambulatory Visit | Attending: Interventional Radiology | Admitting: Interventional Radiology

## 2017-02-11 ENCOUNTER — Ambulatory Visit (HOSPITAL_COMMUNITY)
Admission: RE | Admit: 2017-02-11 | Discharge: 2017-02-11 | Disposition: A | Discharge status: Home / Self Care | Payer: Medicaid Other | Source: Ambulatory Visit | Attending: Interventional Radiology | Admitting: Interventional Radiology

## 2017-02-11 ENCOUNTER — Encounter: Payer: Self-pay | Admitting: Radiology

## 2017-02-11 DIAGNOSIS — K8020 Calculus of gallbladder without cholecystitis without obstruction: Secondary | ICD-10-CM | POA: Diagnosis not present

## 2017-02-11 DIAGNOSIS — C220 Liver cell carcinoma: Secondary | ICD-10-CM

## 2017-02-11 DIAGNOSIS — K760 Fatty (change of) liver, not elsewhere classified: Secondary | ICD-10-CM | POA: Insufficient documentation

## 2017-02-11 DIAGNOSIS — R161 Splenomegaly, not elsewhere classified: Secondary | ICD-10-CM | POA: Diagnosis not present

## 2017-02-11 DIAGNOSIS — K7460 Unspecified cirrhosis of liver: Secondary | ICD-10-CM | POA: Insufficient documentation

## 2017-02-11 HISTORY — PX: IR GENERIC HISTORICAL: IMG1180011

## 2017-02-11 MED ORDER — GADOXETATE DISODIUM 0.25 MMOL/ML IV SOLN
10.0000 mL | Freq: Once | INTRAVENOUS | Status: AC | PRN
  Administered 2017-02-11: 7 mL via INTRAVENOUS

## 2017-02-11 NOTE — Progress Notes (Signed)
Referring Physician(s): Magrinat,G/Hayes,J  Chief Complaint: The patient is seen in follow up today s/p DEB- TACE on 06/09/14 followed by thermal ablation on 06/10/14 of right hepatic lobe [place]  History of present illness: Jeffrey Snow is a 61 y.o. male alcoholic and HCV cirrhosis complicated by hepatocellular carcinoma, ascites and esophageal varices. He was treated by combination drug-eluting bead transarterial chemoembolization followed by microwave thermal ablation on 6/25 and 06/10/2014. Follow-up surveillance imaging has demonstrated a complete response to therapy to this point. He presents again today to IR clinic for follow-up MRI and routine evaluation. He continues to do well and is currently without new complaints. He is currently on Aldactone and Lasix therapy. Latest AFP from 01/27/17 is 24.9, previously 25.7. Liver function tests remain normal. He was seen recently by primary care service for diffuse rash and punch biopsy was performed from site on abdomen which revealed ulcerated lichen simplex chronicus. His activity level is good as well as appetite. He continues to abstain from smoking or alcohol use. He has been released from [name] following completion of treatment for hepatitis C.   Past Medical History:  Diagnosis Date  . Abnormal MRI of abdomen, liver  04/21/2014  . Anxiety   . Cirrhosis, alcoholic ([place]) 123456  . Claudication of left lower extremity (HCC) m-1  . Depression   . [place] (hepatocellular carcinoma) ([place])   . Hepatitis C   . Hypertension   . Liver mass   . Panic attack     Past Surgical History:  Procedure Laterality Date  . ABDOMINAL AORTAGRAM N/A 12/27/2013   Procedure: ABDOMINAL Maxcine Ham;  Surgeon: Conrad Hollister, MD;  Location: [facility] CATH LAB;  Service: Cardiovascular;  Laterality: N/A;  . AORTOGRAM    . COSMETIC SURGERY     facial reconstructive surgery  . ESOPHAGOGASTRODUODENOSCOPY N/A 12/17/2014   Procedure: ESOPHAGOGASTRODUODENOSCOPY (EGD);   Surgeon: Missy Sabins, MD;  Location: [facility] ENDOSCOPY;  Service: Endoscopy;  Laterality: N/A;  . ESOPHAGOGASTRODUODENOSCOPY N/A 09/05/2015   Procedure: ESOPHAGOGASTRODUODENOSCOPY (EGD);  Surgeon: Wonda Horner, MD;  Location: [facility] ENDOSCOPY;  Service: Endoscopy;  Laterality: N/A;  . IR GENERIC HISTORICAL  01/19/2015   IR RADIOLOGIST EVAL & MGMT 01/19/2015 Jacqulynn Cadet, MD [facility] INTERV RAD  . IR GENERIC HISTORICAL  07/23/2016   IR RADIOLOGIST EVAL & MGMT 07/23/2016 Jacqulynn Cadet, MD [facility] INTERV RAD  . IR GENERIC HISTORICAL  05/30/2015   IR RADIOLOGIST EVAL & MGMT 05/30/2015 [facility] INTERV RAD  . IR GENERIC HISTORICAL  10/04/2014   IR RADIOLOGIST EVAL & MGMT 10/04/2014 Jacqulynn Cadet, MD [facility] INTERV RAD  . LOWER EXTREMITY ANGIOGRAM Bilateral 12/27/2013   Procedure: LOWER EXTREMITY ANGIOGRAM;  Surgeon: Conrad [place], MD;  Location: [facility] CATH LAB;  Service: Cardiovascular;  Laterality: Bilateral;    Allergies: Folic acid  Medications: Prior to Admission medications   Medication Sig Start Date End Date Taking? Authorizing Provider  ALPRAZolam Duanne Moron) 0.5 MG tablet Take 0.5 mg by mouth 2 (two) times daily.    Yes Historical Provider, MD  bisacodyl (DULCOLAX) 5 MG EC tablet Take 5 mg by mouth daily as needed for mild constipation or moderate constipation. Reported on 04/03/2016   Yes Historical Provider, MD  docusate sodium 100 MG CAPS Take 100 mg by mouth 2 (two) times daily. 06/11/14  Yes Ascencion Dike, PA-C  ferrous sulfate 325 (65 FE) MG EC tablet Take 325 mg by mouth 2 (two) times daily.   Yes Historical Provider, MD  FLUoxetine (PROZAC) 40 MG capsule  Take 1 capsule by mouth every morning. 05/02/15  Yes Historical Provider, MD  furosemide (LASIX) 20 MG tablet TAKE 1 TABLET BY MOUTH EVERY DAY 12/18/16  Yes Micheline Chapman, NP  gabapentin (NEURONTIN) 100 MG capsule TAKE 1 CAPSULE(100 MG) BY MOUTH THREE TIMES DAILY 12/18/16  Yes Dorena Dew, FNP  pantoprazole (PROTONIX) 40 MG tablet Take 1 tablet (40  mg total) by mouth 2 (two) times daily before a meal. 09/09/15  Yes Eugenie Filler, MD  HYDROSKIN 1 % lotion APPLY EXTERNALLY TO [place] Patient not taking: Reported on 07/23/2016 06/03/16   Dorena Dew, FNP  HYDROSKIN 1 % lotion APPLY EXTERNALLY TO THE AFFECTED AREA TWICE DAILY Patient not taking: Reported on 07/23/2016 07/12/16   Micheline Chapman, NP  hydrOXYzine (ATARAX/VISTARIL) 10 MG tablet TAKE 1 TABLET(10 MG) BY MOUTH THREE TIMES DAILY AS NEEDED Patient not taking: Reported on 01/27/2017 07/12/16   Micheline Chapman, NP  hydrOXYzine (ATARAX/VISTARIL) 10 MG tablet TAKE 1 TABLET(10 MG) BY MOUTH THREE TIMES DAILY AS NEEDED 12/18/16   Dorena Dew, FNP  permethrin (ELIMITE) 5 % cream APPLY EXTERNALLY TO THE AFFECTED AREA 1 TIME Patient not taking: Reported on 01/27/2017 11/05/16   Micheline Chapman, NP  polyethylene glycol (MIRALAX / GLYCOLAX) packet Take 17 g by mouth daily. Patient not taking: Reported on 05/16/2016 09/09/15   Eugenie Filler, MD  spironolactone (ALDACTONE) 25 MG tablet Take 3 tablets (75 mg total) by mouth every morning. Resume in 2-3 days. Patient not taking: Reported on 02/11/2017 09/12/15   Eugenie Filler, MD  zolpidem (AMBIEN) 10 MG tablet Take 10 mg by mouth at bedtime as needed for sleep.     Historical Provider, MD     Family History  Problem Relation Age of Onset  . Diabetes Father   . Diabetes Brother   . Diabetes Brother     Social History   Social History  . Marital status: Single    Spouse name: N/A  . Number of children: 3  . Years of education: N/A   Occupational History  .  [employer]   Social History Main Topics  . Smoking status: Former Smoker    Packs/day: 0.50    Quit date: 08/21/2011  . Smokeless tobacco: Never Used  . Alcohol use No     Comment: Occasional beer on the weekends  . Drug use: No     Comment: abused cocaine in the 80's  . Sexual activity: No   Other Topics Concern  . Not on file   Social  History Narrative   Single, never married.  Lives alone.  3 daughters, 16 grandchildren who are not very involved in the patient's care.  Quit smoking and heavy drinking 3.5 years ago.      Vital Signs: BP (!) 163/76 (BP Location: Left Arm, Patient Position: Sitting, Cuff Size: Large)   Pulse (!) 51   Temp 97.3 F (36.3 C) (Oral)   Resp 16   Ht 5' 8.5" (1.74 m)   Wt 178 lb (80.7 kg)   SpO2 97%   BMI 26.67 kg/m   Physical Exam awake, alert. Chest clear to auscultation bilaterally. Heart with regular rate and rhythm. Abdomen soft, positive bowel sounds, nontender. No lower extremity edema.  Imaging: Mr Abdomen Wwo Contrast  Result Date: 02/11/2017 CLINICAL DATA:  Cirrhosis with [place] in right hepatic lobe, status post TACE and percutaneous ablation in 2015 EXAM: MRI ABDOMEN WITHOUT AND WITH  CONTRAST TECHNIQUE: Multiplanar multisequence MR imaging of the abdomen was performed both before and after the administration of intravenous contrast. CONTRAST:  7 mL Eovist IV COMPARISON:  07/23/2016 FINDINGS: Motion degraded images. Lower chest: Lung bases are clear. Hepatobiliary: Cirrhosis. Mild to moderate hepatic steatosis. Postcontrast imaging is severely motion degraded but notable for a 2.4 cm ablation defect in segment 7 (series 503/ image 23). No suspicious/enhancing lesion is noted, noting that imaging sensitivity is limited. Layering gallstones (series 10/ image 31), without associated inflammatory changes. No intrahepatic or extrahepatic ductal dilatation. Pancreas:  Within normal limits. Spleen: Enlarged, measuring 16.9 cm in maximal craniocaudal dimension. Adrenals/Urinary Tract:  Adrenal glands are within normal limits. Kidneys are within normal limits.  No hydronephrosis. Stomach/Bowel: Stomach is within normal limits. Visualized bowel unremarkable. Vascular/Lymphatic:  No evidence of abdominal aortic aneurysm. Portal vein is patent. No suspicious abdominal lymphadenopathy. Other:  No  abdominal ascites. Musculoskeletal: No focal osseous lesions. IMPRESSION: Severely motion degraded images. 2.4 cm ablation defect in segment 7. No suspicious/enhancing lesion is noted. Cirrhosis.  Mild to moderate hepatic steatosis. Splenomegaly. Cholelithiasis, without associated inflammatory changes. Electronically Signed   By: Julian Hy M.D.   On: 02/11/2017 08:43    Labs:  CBC:  Recent Labs  04/03/16 1107 05/16/16 1031 07/08/16 1053 01/27/17 1300  WBC 3.9 4.6 4.5 4.1  HGB 11.2* 12.2* 14.5 16.4  HCT 37.5* 39.1 44.4 49.1  PLT 55* 66* 54* 53*    COAGS:  Recent Labs  07/08/16 1053 01/27/17 1300  INR 1.1 1.1    BMP:  Recent Labs  04/03/16 1107 07/08/16 1053 01/27/17 1300  NA 138 136 137  K 4.0 3.5 4.2  CL 102 103 101  CO2 24 25 28   GLUCOSE 68 85 63*  BUN 10 11 14   CALCIUM 9.0 8.8 9.1  CREATININE 0.98 1.04 1.15  GFRNONAA 83  --   --   GFRAA >89  --   --     LIVER FUNCTION TESTS:  Recent Labs  04/03/16 1107 07/08/16 1053 01/27/17 1300  BILITOT 0.8 0.6 0.7  AST 14 20 20   ALT 10 20 21   ALKPHOS 145* 139* 110  PROT 7.0 6.5 7.0  ALBUMIN 4.1 3.7 4.2    Assessment: Jeffrey Snow is a 61 y.o. male alcoholic and HCV cirrhosis complicated by hepatocellular carcinoma, ascites and esophageal varices. He was treated by combination drug-eluting bead transarterial chemoembolization followed by microwave thermal ablation on 6/25 and 06/10/2014. Follow-up surveillance imaging has demonstrated a complete response to therapy to this point. Patient is currently stable with no acute complaints. He has completed treatment of his hepatitis C. He continues to abstain from smoking and alcohol use. Latest AFP is 24.9. Liver function tests remain normal. MRI of the abdomen performed today reveals 2.4 cm ablation defect in segment 7. No suspicious/enhancing lesion. Cirrhosis.  Mild to moderate hepatic steatosis.Splenomegaly.Cholelithiasis, without associated inflammatory  changes. Imaging studies were reviewed with the patient today by Dr. Laurence Ferrari. Patient has also recently been encouraged by [name] to taper diuretic use and Dr. Laurence Ferrari concurs with this decision. If patient feels that ascites is returning following this he could always resume diuretic therapy and/or undergo paracentesis if needed. He'll be scheduled for follow-up IR clinic visit and MRI of the abdomen in 6 months. He was told to contact our service in the interim with any additional questions or concerns.   Signed: D. Rowe Robert 02/11/2017, 11:19 AM   Please refer to Dr. Katrinka Blazing attestation of  this note for management and plan.      Patient ID: AEON CAULLEY, male   DOB: 05-24-1956, 61 y.o.   MRN: [id number]

## 2017-02-17 ENCOUNTER — Ambulatory Visit (INDEPENDENT_AMBULATORY_CARE_PROVIDER_SITE_OTHER): Payer: Medicaid Other | Admitting: Family Medicine

## 2017-02-17 ENCOUNTER — Encounter: Payer: Self-pay | Admitting: Family Medicine

## 2017-02-17 VITALS — BP 140/64 | HR 86 | Temp 97.8°F | Resp 12 | Ht 68.5 in | Wt 178.0 lb

## 2017-02-17 DIAGNOSIS — R21 Rash and other nonspecific skin eruption: Secondary | ICD-10-CM | POA: Diagnosis not present

## 2017-02-17 NOTE — Progress Notes (Signed)
Jeffrey Snow, is a 61 y.o. male  [id number]  [id number]  DOB - Apr 27, 1956  CC:  Chief Complaint  Patient presents with  . follow up    02/25/17 dermatology appointment  . Rash    has new rash in groin area, wonders if its jock itch or from his coffee pot...its where he sets it  . jury summons       HPI: Lydon Wenzinger is a 61 y.o. male  here complaining of a new skin lesion. He has a generalized rash and is seeing a dermatologist next week. This new lesion he reports is in his groin and he wonders if it is jock itch vs and injury from where he sets his hot coffee pot. He also needs paperwork to excuse him from jury duty. Allergies  Allergen Reactions  . Folic Acid Other (See Comments)    Makes stomach feel funny   Past Medical History:  Diagnosis Date  . Abnormal MRI of abdomen, liver  04/21/2014  . Anxiety   . Cirrhosis, alcoholic ([place]) 123456  . Claudication of left lower extremity (HCC) m-1  . Depression   . [place] (hepatocellular carcinoma) ([place])   . Hepatitis C   . Hypertension   . Liver mass   . Panic attack    Current Outpatient Prescriptions on File Prior to Visit  Medication Sig Dispense Refill  . ALPRAZolam (XANAX) 0.5 MG tablet Take 0.5 mg by mouth 2 (two) times daily.     . bisacodyl (DULCOLAX) 5 MG EC tablet Take 5 mg by mouth daily as needed for mild constipation or moderate constipation. Reported on 04/03/2016    . docusate sodium 100 MG CAPS Take 100 mg by mouth 2 (two) times daily. 20 capsule 0  . ferrous sulfate 325 (65 FE) MG EC tablet Take 325 mg by mouth 2 (two) times daily.    Marland Kitchen FLUoxetine (PROZAC) 40 MG capsule Take 1 capsule by mouth every morning.  2  . furosemide (LASIX) 20 MG tablet TAKE 1 TABLET BY MOUTH EVERY DAY 30 tablet 0  . gabapentin (NEURONTIN) 100 MG capsule TAKE 1 CAPSULE(100 MG) BY MOUTH THREE TIMES DAILY 90 capsule 3  . hydrOXYzine (ATARAX/VISTARIL) 10 MG tablet TAKE 1 TABLET(10 MG) BY MOUTH THREE TIMES DAILY AS NEEDED 60  tablet 3  . pantoprazole (PROTONIX) 40 MG tablet Take 1 tablet (40 mg total) by mouth 2 (two) times daily before a meal. 62 tablet 0  . HYDROSKIN 1 % lotion APPLY EXTERNALLY TO THE AFFECTED AREA TWICE DAILY (Patient not taking: Reported on 07/23/2016) 30 mL 0  . HYDROSKIN 1 % lotion APPLY EXTERNALLY TO THE AFFECTED AREA TWICE DAILY (Patient not taking: Reported on 07/23/2016) 120 mL 0  . hydrOXYzine (ATARAX/VISTARIL) 10 MG tablet TAKE 1 TABLET(10 MG) BY MOUTH THREE TIMES DAILY AS NEEDED (Patient not taking: Reported on 01/27/2017) 60 tablet 0  . permethrin (ELIMITE) 5 % cream APPLY EXTERNALLY TO THE AFFECTED AREA 1 TIME (Patient not taking: Reported on 01/27/2017) 60 g 0  . polyethylene glycol (MIRALAX / GLYCOLAX) packet Take 17 g by mouth daily. (Patient not taking: Reported on 05/16/2016) 14 each 0  . spironolactone (ALDACTONE) 25 MG tablet Take 3 tablets (75 mg total) by mouth every morning. Resume in 2-3 days. (Patient not taking: Reported on 02/11/2017)    . zolpidem (AMBIEN) 10 MG tablet Take 10 mg by mouth at bedtime as needed for sleep.      No current facility-administered medications on file prior  to visit.    Family History  Problem Relation Age of Onset  . Diabetes Father   . Diabetes Brother   . Diabetes Brother    Social History   Social History  . Marital status: Single    Spouse name: N/A  . Number of children: 3  . Years of education: N/A   Occupational History  .  [employer]   Social History Main Topics  . Smoking status: Former Smoker    Packs/day: 0.50    Quit date: 08/21/2011  . Smokeless tobacco: Never Used  . Alcohol use No     Comment: Occasional beer on the weekends  . Drug use: No     Comment: abused cocaine in the 80's  . Sexual activity: No   Other Topics Concern  . Not on file   Social History Narrative   Single, never married.  Lives alone.  3 daughters, 16 grandchildren who are not very involved in the patient's care.  Quit smoking and heavy drinking  3.5 years ago.     Review of Systems: Constitutional: Negative Skin: +rashes Psychiatric/Behavioral: depression, anxiety  Objective:   Vitals:   02/17/17 1055  BP: 140/64  Pulse: 86  Resp: 12  Temp: 97.8 F (36.6 C)    Physical Exam: Constitutional: Patient appears well-developed and well-nourished. No distress.  Skin: Skin is warm and dry with scattered rash. On the left inner thigh there is a lesion which is probably from the hot coffee pot.  It is no jock itch.  Psychiatric: Normal mood and affect. Behavior, judgment, thought content normal.  Lab Results  Component Value Date   WBC 4.1 01/27/2017   HGB 16.4 01/27/2017   HCT 49.1 01/27/2017   MCV 90.4 01/27/2017   PLT 53 (L) 01/27/2017   Lab Results  Component Value Date   CREATININE 1.15 01/27/2017   BUN 14 01/27/2017   NA 137 01/27/2017   K 4.2 01/27/2017   CL 101 01/27/2017   CO2 28 01/27/2017    Lab Results  Component Value Date   HGBA1C 5.6 06/01/2015   Lipid Panel     Component Value Date/Time   CHOL 113 01/26/2015 0834   TRIG 46 01/26/2015 0834   HDL 35 (L) 01/26/2015 0834   CHOLHDL 3.2 01/26/2015 0834   VLDL 9 01/26/2015 0834   LDLCALC 69 01/26/2015 0834        Assessment and plan:   There are no diagnoses linked to this encounter.  Return in about 6 months (around 08/20/2017).  The patient was given clear instructions to go to ER or return to medical center if symptoms don't improve, worsen or new problems develop. The patient verbalized understanding.    Micheline Chapman FNP  02/17/2017, 12:51 PM

## 2017-04-04 ENCOUNTER — Other Ambulatory Visit: Payer: Self-pay | Admitting: Family Medicine

## 2017-04-04 DIAGNOSIS — L299 Pruritus, unspecified: Secondary | ICD-10-CM

## 2017-04-24 ENCOUNTER — Encounter: Payer: Self-pay | Admitting: Family Medicine

## 2017-04-24 ENCOUNTER — Ambulatory Visit (INDEPENDENT_AMBULATORY_CARE_PROVIDER_SITE_OTHER): Payer: Medicaid Other | Admitting: Family Medicine

## 2017-04-24 VITALS — BP 134/84 | HR 60 | Temp 98.7°F | Resp 14 | Ht 68.5 in | Wt 169.0 lb

## 2017-04-24 DIAGNOSIS — M25572 Pain in left ankle and joints of left foot: Secondary | ICD-10-CM | POA: Diagnosis not present

## 2017-04-24 DIAGNOSIS — G629 Polyneuropathy, unspecified: Secondary | ICD-10-CM | POA: Diagnosis not present

## 2017-04-24 DIAGNOSIS — I739 Peripheral vascular disease, unspecified: Secondary | ICD-10-CM | POA: Diagnosis not present

## 2017-04-24 DIAGNOSIS — M79604 Pain in right leg: Secondary | ICD-10-CM | POA: Diagnosis not present

## 2017-04-24 DIAGNOSIS — M79605 Pain in left leg: Secondary | ICD-10-CM

## 2017-04-24 LAB — POCT URINALYSIS DIP (DEVICE)
Bilirubin Urine: NEGATIVE
Glucose, UA: NEGATIVE mg/dL
Hgb urine dipstick: NEGATIVE
Ketones, ur: NEGATIVE mg/dL
Leukocytes, UA: NEGATIVE
Nitrite: NEGATIVE
Protein, ur: NEGATIVE mg/dL
Specific Gravity, Urine: <=1.005 (ref 1.005–1.030)
Urobilinogen, UA: 0.2 mg/dL (ref 0.0–1.0)
pH: 6.5 (ref 5.0–8.0)

## 2017-04-24 MED ORDER — PENTOXIFYLLINE ER 400 MG PO TBCR: 400.0000 mg | EXTENDED_RELEASE_TABLET | Freq: Three times a day (TID) | ORAL | 0 refills | Status: DC

## 2017-04-24 NOTE — Progress Notes (Signed)
Patient ID: Jeffrey Snow, male    DOB: April 01, 1956, 61 y.o.   MRN: [id number]  PCP: Scot Jun, FNP  Chief Complaint  Patient presents with  . Foot Pain  . Leg Pain  . Medication Problem    OXYCODONE GIVES HIM HIVES    Subjective:  HPI  Jeffrey Snow is a 61 y.o. male presents for evaluation of foot and leg pain related to peripheral vascular disease.  Medical problems include:  Shamon reports a chronic problem of leg pain related to inflow and outflow vascular disease. He had been seen in the past by Dr. Bridgett Larsson vein and vascular specialist who confirmed PAD with claudication. Reports recently he having burning pain shooting down his legs into his ankles. Pain is exacarbated with walking and most pronounced at bedtime. He also complains of ankle pain radiating into the achilles tendon. He has taken Gabapentin 300 mg 3 times daily for sometime. Since starting the medication, leg pain improved , recently pain is starting to become less manageable.  Mr. Sortor is in the process of applying for a concealed carry weapons permit. He is under the care of psychiatry for mental health conditions. Today is Mr. Weniger, first encounter in clinic with me, Molli Barrows, FNP, as his new PCP.   Social History   Social History  . Marital status: Single    Spouse name: N/A  . Number of children: 3  . Years of education: N/A   Occupational History  .  [employer]   Social History Main Topics  . Smoking status: Former Smoker    Packs/day: 0.50    Quit date: 08/21/2011  . Smokeless tobacco: Never Used  . Alcohol use No     Comment: Occasional beer on the weekends  . Drug use: No     Comment: abused cocaine in the 80's  . Sexual activity: No   Other Topics Concern  . Not on file   Social History Narrative   Single, never married.  Lives alone.  3 daughters, 16 grandchildren who are not very involved in the patient's care.  Quit smoking and heavy drinking 3.5  years ago.     Family History  Problem Relation Age of Onset  . Diabetes Father   . Diabetes Brother   . Diabetes Brother      Review of Systems  Patient Active Problem List   Diagnosis Date Noted  . Acute upper GI bleed 09/05/2015  . Lactic acidosis 09/05/2015  . Hyperkalemia 09/05/2015  . Hemorrhagic shock ([place]) 09/05/2015  . Urinary tract infection, acute 08/31/2015  . Anemia, blood loss 12/16/2014  . Melena 12/16/2014  . Anemia 12/16/2014  . Abdominal pain, chronic, right upper quadrant 07/06/2014  . [place] (hepatocellular carcinoma) ([place]) 06/09/2014  . Other pancytopenia ([place]) 04/27/2014  . Cellulitis of leg 04/24/2014  . Elevated troponin 04/23/2014  . Normocytic anemia 04/21/2014  . Cirrhosis, alcoholic ([place]) 14/97/0263  . Hyponatremia 04/21/2014  . Hypokalemia 04/21/2014  . Metabolic acidosis 78/58/8502  . Hepatitis C 04/21/2014  . Abnormal MRI of abdomen, liver  04/21/2014  . Leg pain 04/21/2014  . Rash and nonspecific skin eruption 04/21/2014  . T wave inversion in EKG 04/21/2014  . Right foot pain 03/31/2014  . Hyperglycemia 03/03/2014  . Vitamin D deficiency disease 03/03/2014  . HTN (hypertension) 03/03/2014  . Elevated LFTs 03/03/2014  . Screening for colon cancer 03/03/2014  . Annual physical exam 03/03/2014  . PVD (peripheral vascular disease) ([place]) 12/03/2013  .  PAD (peripheral artery disease) ([place]) 10/26/2013  . Arterial insufficiency ([place]) 10/26/2013  . Fall 10/26/2013  . Depression 09/22/2013  . Insomnia 09/22/2013  . Claudication of left lower extremity ([place])   . Edema 08/20/2013  . Pain in limb 08/20/2013  . Atherosclerosis of native arteries of the extremities with intermittent claudication 08/20/2013  . Thrombocytopenia ([place]) 10/12/2012    Allergies  Allergen Reactions  . Folic Acid Other (See Comments)    Makes stomach feel funny    Prior to Admission medications   Medication Sig Start Date End Date Taking? Authorizing Provider   ALPRAZolam Duanne Moron) 0.5 MG tablet Take 0.5 mg by mouth 2 (two) times daily.    Yes [provider]  bisacodyl (DULCOLAX) 5 MG EC tablet Take 5 mg by mouth daily as needed for mild constipation or moderate constipation. Reported on 04/03/2016   Yes [provider]  docusate sodium 100 MG CAPS Take 100 mg by mouth 2 (two) times daily. 06/11/14  Yes Bruning, Lennette Bihari, PA-C  furosemide (LASIX) 20 MG tablet TAKE 1 TABLET BY MOUTH EVERY DAY 12/18/16  Yes Micheline Chapman, NP  gabapentin (NEURONTIN) 100 MG capsule TAKE 1 CAPSULE(100 MG) BY MOUTH THREE TIMES DAILY 12/18/16  Yes Dorena Dew, FNP  HYDROSKIN 1 % lotion APPLY EXTERNALLY TO THE AFFECTED AREA TWICE DAILY 07/12/16  Yes Micheline Chapman, NP  hydrOXYzine (ATARAX/VISTARIL) 10 MG tablet TAKE 1 TABLET(10 MG) BY MOUTH THREE TIMES DAILY AS NEEDED 04/04/17  Yes Dorena Dew, FNP  pantoprazole (PROTONIX) 40 MG tablet Take 1 tablet (40 mg total) by mouth 2 (two) times daily before a meal. 09/09/15  Yes Eugenie Filler, MD  permethrin (ELIMITE) 5 % cream APPLY EXTERNALLY TO THE AFFECTED AREA 1 TIME 11/05/16  Yes Micheline Chapman, NP  spironolactone (ALDACTONE) 25 MG tablet Take 3 tablets (75 mg total) by mouth every morning. Resume in 2-3 days. 09/12/15  Yes Eugenie Filler, MD  ferrous sulfate 325 (65 FE) MG EC tablet Take 325 mg by mouth 2 (two) times daily.    [provider]  FLUoxetine (PROZAC) 40 MG capsule Take 1 capsule by mouth every morning. 05/02/15   [provider]  HYDROSKIN 1 % lotion APPLY EXTERNALLY TO THE AFFECTED AREA TWICE DAILY Patient not taking: Reported on 04/24/2017 06/03/16   Dorena Dew, FNP  hydrOXYzine (ATARAX/VISTARIL) 10 MG tablet TAKE 1 TABLET(10 MG) BY MOUTH THREE TIMES DAILY AS NEEDED Patient not taking: Reported on 04/24/2017 07/12/16   Micheline Chapman, NP  polyethylene glycol (MIRALAX / GLYCOLAX) packet Take 17 g by mouth daily. Patient not taking: Reported on 04/24/2017  09/09/15   Eugenie Filler, MD  zolpidem (AMBIEN) 10 MG tablet Take 10 mg by mouth at bedtime as needed for sleep.     [provider]    Past Medical, Surgical Family and Social History reviewed and updated.    Objective:   Today's Vitals   04/24/17 1016  BP: 134/84  Pulse: 60  Resp: 14  Temp: 98.7 F (37.1 C)  TempSrc: Oral  SpO2: 97%  Weight: 169 lb (76.7 kg)  Height: 5' 8.5" (1.74 m)    Wt Readings from Last 3 Encounters:  04/24/17 169 lb (76.7 kg)  02/17/17 178 lb (80.7 kg)  02/11/17 178 lb (80.7 kg)    Physical Exam  Constitutional: He is oriented to person, place, and time. He appears well-developed and well-nourished.  HENT:  Head: Normocephalic and atraumatic.  Eyes:  Conjunctivae and EOM are normal. Pupils are equal, round, and reactive to light.  Neck: Normal range of motion. Neck supple.  Cardiovascular: Normal rate, regular rhythm, normal heart sounds and intact distal pulses.   Pulmonary/Chest: Effort normal and breath sounds normal.  Musculoskeletal: Normal range of motion.  Neurological: He is alert and oriented to person, place, and time.  Reflex Scores:      Patellar reflexes are 2+ on the right side and 2+ on the left side. Psychiatric: Judgment and thought content normal. His mood appears anxious. His speech is rapid and/or pressured. He is hyperactive.     Assessment & Plan:  1. PAD (peripheral artery disease) ([place]) 2. Acute left ankle pain 3. Pain in both lower extremities 4. Neuropathy -Start pentoxifylline 400 mg, 3 times daily with meals -Continue Gabapentin 300 mg, 3 times daily  Patient was advised to defer any request for his concealed carry permit and or psychological evaluation to his mental health provider.  RTC: 3 months to evaluate leg pain  Carroll Sage. Kenton Kingfisher, MSN, [facility] [address] [place], [place] [address] [phone]

## 2017-04-24 NOTE — Patient Instructions (Addendum)
I am starting you on [place] for lower extremity pain.   Return for care in 6 weeks to evaluate leg pain.   If you need approval for concealed to carry permit, I will need documentation from your psychiatric that you are psychiatrically stable to have a concealed carry permit.

## 2017-04-29 ENCOUNTER — Other Ambulatory Visit: Payer: Self-pay | Admitting: Family Medicine

## 2017-04-29 DIAGNOSIS — G629 Polyneuropathy, unspecified: Secondary | ICD-10-CM

## 2017-04-29 DIAGNOSIS — L299 Pruritus, unspecified: Secondary | ICD-10-CM

## 2017-05-15 ENCOUNTER — Telehealth: Payer: Self-pay

## 2017-05-15 NOTE — Telephone Encounter (Signed)
Left a vm returning patient call about referral

## 2017-05-16 ENCOUNTER — Other Ambulatory Visit: Payer: Self-pay | Admitting: Family Medicine

## 2017-05-16 DIAGNOSIS — R21 Rash and other nonspecific skin eruption: Secondary | ICD-10-CM

## 2017-05-16 DIAGNOSIS — G8929 Other chronic pain: Secondary | ICD-10-CM

## 2017-05-16 NOTE — Telephone Encounter (Signed)
Patient would like a referral to a different dermatology and Pain management.

## 2017-05-16 NOTE — Progress Notes (Signed)
Advised [name] that I would refer him to pain management and dermatology as he is dissatisfied with [facility] services. I advise him that there will likely be some delay in getting him established at another practice however, I am willing to attempt. He was upset regarding the prior authorization process for getting his controlled prescriptions filled. I also explained that he would  likely encounter PA at any pain clinic as Medicare/Medicaid are changing the process for authorizing payment for opioid medication.  Carroll Sage. Kenton Kingfisher, MSN, FNP-C [facility]  [address] [place], [place] [address] [phone]

## 2017-05-26 ENCOUNTER — Other Ambulatory Visit: Payer: Self-pay | Admitting: Family Medicine

## 2017-05-26 DIAGNOSIS — G629 Polyneuropathy, unspecified: Secondary | ICD-10-CM

## 2017-05-26 DIAGNOSIS — L299 Pruritus, unspecified: Secondary | ICD-10-CM

## 2017-06-18 ENCOUNTER — Other Ambulatory Visit: Payer: Self-pay | Admitting: Family Medicine

## 2017-06-18 DIAGNOSIS — L299 Pruritus, unspecified: Secondary | ICD-10-CM

## 2017-06-18 DIAGNOSIS — G629 Polyneuropathy, unspecified: Secondary | ICD-10-CM

## 2017-07-09 ENCOUNTER — Telehealth: Payer: Self-pay

## 2017-07-09 MED ORDER — FUROSEMIDE 20 MG PO TABS: 20.0000 mg | ORAL_TABLET | Freq: Every day | ORAL | 0 refills | Status: DC

## 2017-07-10 ENCOUNTER — Telehealth: Payer: Self-pay

## 2017-07-10 MED ORDER — FERROUS SULFATE 325 (65 FE) MG PO TBEC: 325.0000 mg | DELAYED_RELEASE_TABLET | Freq: Every day | ORAL | 1 refills | Status: DC

## 2017-07-10 NOTE — Telephone Encounter (Signed)
Refilled iron tablets per phone request.

## 2017-07-11 ENCOUNTER — Other Ambulatory Visit: Payer: Self-pay | Admitting: Family Medicine

## 2017-07-11 DIAGNOSIS — L299 Pruritus, unspecified: Secondary | ICD-10-CM

## 2017-07-23 ENCOUNTER — Other Ambulatory Visit (HOSPITAL_COMMUNITY): Payer: Self-pay | Admitting: Interventional Radiology

## 2017-07-23 ENCOUNTER — Other Ambulatory Visit: Payer: Self-pay | Admitting: Radiology

## 2017-07-23 DIAGNOSIS — C220 Liver cell carcinoma: Secondary | ICD-10-CM

## 2017-07-25 LAB — COMPLETE METABOLIC PANEL WITH GFR
ALT: 11 U/L (ref 9–46)
AST: 12 U/L (ref 10–35)
Albumin: 4 g/dL (ref 3.6–5.1)
Alkaline Phosphatase: 103 U/L (ref 40–115)
BUN: 12 mg/dL (ref 7–25)
CO2: 20 mmol/L (ref 20–32)
Calcium: 8.8 mg/dL (ref 8.6–10.3)
Chloride: 105 mmol/L (ref 98–110)
Creat: 0.82 mg/dL (ref 0.70–1.25)
GFR, Est African American: >89 mL/min (ref >=60)
GFR, Est Non African American: >89 mL/min (ref >=60)
Glucose, Bld: 123 mg/dL — ABNORMAL HIGH (ref 65–99)
Potassium: 3.6 mmol/L (ref 3.5–5.3)
Sodium: 137 mmol/L (ref 135–146)
Total Bilirubin: 0.8 mg/dL (ref 0.2–1.2)
Total Protein: 6.3 g/dL (ref 6.1–8.1)

## 2017-07-25 LAB — CBC
HCT: 41 % (ref 38.5–50.0)
Hemoglobin: 13.9 g/dL (ref 13.2–17.1)
MCH: 31 pg (ref 27.0–33.0)
MCHC: 33.9 g/dL (ref 32.0–36.0)
MCV: 91.5 fL (ref 80.0–100.0)
Platelets: 48 10*3/uL — ABNORMAL LOW (ref 140–400)
RBC: 4.48 MIL/uL (ref 4.20–5.80)
RDW: 14.3 % (ref 11.0–15.0)
WBC: 3.7 10*3/uL — ABNORMAL LOW (ref 3.8–10.8)

## 2017-07-25 LAB — PROTIME-INR
INR: 1.1
Prothrombin Time: 12 s — ABNORMAL HIGH (ref 9.0–11.5)

## 2017-07-28 ENCOUNTER — Encounter: Payer: Self-pay | Admitting: *Deleted

## 2017-07-28 LAB — AFP TUMOR MARKER: AFP-Tumor Marker: 49.5 ng/mL — ABNORMAL HIGH (ref <6.1)

## 2017-07-30 ENCOUNTER — Ambulatory Visit (HOSPITAL_COMMUNITY): Payer: Medicaid Other

## 2017-07-30 ENCOUNTER — Other Ambulatory Visit: Payer: Self-pay

## 2017-08-03 ENCOUNTER — Other Ambulatory Visit: Payer: Self-pay | Admitting: Family Medicine

## 2017-08-03 DIAGNOSIS — L299 Pruritus, unspecified: Secondary | ICD-10-CM

## 2017-08-03 DIAGNOSIS — G629 Polyneuropathy, unspecified: Secondary | ICD-10-CM

## 2017-08-10 ENCOUNTER — Other Ambulatory Visit: Payer: Self-pay | Admitting: Family Medicine

## 2017-08-13 ENCOUNTER — Ambulatory Visit
Admission: RE | Admit: 2017-08-13 | Discharge: 2017-08-13 | Disposition: A | Discharge status: Home / Self Care | Payer: Medicaid Other | Source: Ambulatory Visit | Attending: Interventional Radiology | Admitting: Interventional Radiology

## 2017-08-13 ENCOUNTER — Ambulatory Visit (HOSPITAL_COMMUNITY)
Admission: RE | Admit: 2017-08-13 | Discharge: 2017-08-13 | Disposition: A | Discharge status: Home / Self Care | Payer: Medicaid Other | Source: Ambulatory Visit | Attending: Interventional Radiology | Admitting: Interventional Radiology

## 2017-08-13 DIAGNOSIS — K766 Portal hypertension: Secondary | ICD-10-CM | POA: Diagnosis not present

## 2017-08-13 DIAGNOSIS — K8020 Calculus of gallbladder without cholecystitis without obstruction: Secondary | ICD-10-CM | POA: Insufficient documentation

## 2017-08-13 DIAGNOSIS — K7460 Unspecified cirrhosis of liver: Secondary | ICD-10-CM | POA: Diagnosis not present

## 2017-08-13 DIAGNOSIS — K740 Hepatic fibrosis: Secondary | ICD-10-CM | POA: Insufficient documentation

## 2017-08-13 DIAGNOSIS — C220 Liver cell carcinoma: Secondary | ICD-10-CM | POA: Insufficient documentation

## 2017-08-13 DIAGNOSIS — R161 Splenomegaly, not elsewhere classified: Secondary | ICD-10-CM | POA: Insufficient documentation

## 2017-08-13 HISTORY — PX: IR RADIOLOGIST EVAL & MGMT: IMG5224

## 2017-08-13 MED ORDER — GADOXETATE DISODIUM 0.25 MMOL/ML IV SOLN
10.0000 mL | Freq: Once | INTRAVENOUS | Status: AC | PRN
  Administered 2017-08-13: 7 mL via INTRAVENOUS

## 2017-08-13 NOTE — Progress Notes (Signed)
Chief Complaint: Patient was seen in follow up today for hepatocellular cancer at the request of McCullough,Heath  Referring Physician(s): McCullough,Heath  History of Present Illness: Jeffrey Snow is a 61 y.o. male with alcoholic and HCV cirrhosis complicated by hepatocellular carcinoma, ascites and esophageal varices. He was treated by combination drug-eluting bead transarterial chemoembolization followed by microwave thermal ablation on 6/25 and 06/10/2014.  Follow-up surveillance imaging has demonstrated a complete response to therapy to this point. He presents again today to IR clinic for follow-up MRI and routine evaluation. He continues to do well and is currently without new complaints. He is currently on Aldactone and Lasix therapy. Latest AFP is elevated at 49 from 01/27/17 is 24.9, previously 25.7. Liver function tests remain normal.  He remains asymptomatic. He denies current or recent alcohol use and is doing very well maintaining his sobriety. He is essentially clinically asymptomatic. Denies abdominal pain, nausea, vomiting, or other systemic symptoms.  Past Medical History:  Diagnosis Date  . Abnormal MRI of abdomen, liver  04/21/2014  . Anxiety   . Cirrhosis, alcoholic ([place]) 0/02/5008  . Claudication of left lower extremity (HCC) m-1  . Depression   . [place] (hepatocellular carcinoma) ([place])   . Hepatitis C   . Hypertension   . Liver mass   . Panic attack     Past Surgical History:  Procedure Laterality Date  . ABDOMINAL AORTAGRAM N/A 12/27/2013   Procedure: ABDOMINAL Maxcine Ham;  Surgeon: Conrad [place]burg, MD;  Location: [facility] CATH LAB;  Service: Cardiovascular;  Laterality: N/A;  . AORTOGRAM    . COSMETIC SURGERY     facial reconstructive surgery  . ESOPHAGOGASTRODUODENOSCOPY N/A 12/17/2014   Procedure: ESOPHAGOGASTRODUODENOSCOPY (EGD);  Surgeon: Missy Sabins, MD;  Location: [facility] ENDOSCOPY;  Service: Endoscopy;  Laterality: N/A;  . ESOPHAGOGASTRODUODENOSCOPY N/A  09/05/2015   Procedure: ESOPHAGOGASTRODUODENOSCOPY (EGD);  Surgeon: Wonda Horner, MD;  Location: [facility] ENDOSCOPY;  Service: Endoscopy;  Laterality: N/A;  . IR GENERIC HISTORICAL  01/19/2015   IR RADIOLOGIST EVAL & MGMT 01/19/2015 Jacqulynn Cadet, MD [facility] INTERV RAD  . IR GENERIC HISTORICAL  07/23/2016   IR RADIOLOGIST EVAL & MGMT 07/23/2016 Jacqulynn Cadet, MD [facility] INTERV RAD  . IR GENERIC HISTORICAL  05/30/2015   IR RADIOLOGIST EVAL & MGMT 05/30/2015 [facility] INTERV RAD  . IR GENERIC HISTORICAL  10/04/2014   IR RADIOLOGIST EVAL & MGMT 10/04/2014 Jacqulynn Cadet, MD [facility] INTERV RAD  . IR GENERIC HISTORICAL  02/11/2017   IR RADIOLOGIST EVAL & MGMT 02/11/2017 Jacqulynn Cadet, MD [facility] INTERV RAD  . LOWER EXTREMITY ANGIOGRAM Bilateral 12/27/2013   Procedure: LOWER EXTREMITY ANGIOGRAM;  Surgeon: Conrad [place], MD;  Location: [facility] CATH LAB;  Service: Cardiovascular;  Laterality: Bilateral;    Allergies: Folic acid  Medications: Prior to Admission medications   Medication Sig Start Date End Date Taking? Authorizing Provider  ALPRAZolam Duanne Moron) 0.5 MG tablet Take 0.5 mg by mouth 2 (two) times daily.    Yes [provider]  bisacodyl (DULCOLAX) 5 MG EC tablet Take 5 mg by mouth daily as needed for mild constipation or moderate constipation. Reported on 04/03/2016   Yes [provider]  ferrous sulfate 325 (65 FE) MG EC tablet Take 1 tablet (325 mg total) by mouth daily with breakfast. 07/10/17  Yes Scot Jun, FNP  FLUoxetine (PROZAC) 40 MG capsule Take 1 capsule by mouth every morning. 05/02/15  Yes [provider]  folic acid (FOLVITE) 381 MCG tablet Take 400 mcg by mouth  daily.   Yes [provider]  furosemide (LASIX) 20 MG tablet TAKE 1 TABLET(20 MG) BY MOUTH DAILY 08/11/17  Yes Scot Jun, FNP  gabapentin (NEURONTIN) 100 MG capsule TAKE 1 CAPSULE(100 MG) BY MOUTH THREE TIMES DAILY 08/04/17  Yes Scot Jun, FNP  hydrOXYzine (ATARAX/VISTARIL) 10  MG tablet TAKE 1 TABLET(10 MG) BY MOUTH THREE TIMES DAILY AS NEEDED 07/12/16  Yes Micheline Chapman, NP  pantoprazole (PROTONIX) 40 MG tablet Take 1 tablet (40 mg total) by mouth 2 (two) times daily before a meal. 09/09/15  Yes Eugenie Filler, MD  pentoxifylline (TRENTAL) 400 MG CR tablet TAKE 1 TABLET(400 MG) BY MOUTH THREE TIMES DAILY WITH MEALS 06/19/17  Yes Scot Jun, FNP  spironolactone (ALDACTONE) 25 MG tablet Take 3 tablets (75 mg total) by mouth every morning. Resume in 2-3 days. 09/12/15  Yes Eugenie Filler, MD  vitamin C (ASCORBIC ACID) 250 MG tablet Take 500 mg by mouth daily.   Yes [provider]  zolpidem (AMBIEN) 10 MG tablet Take 10 mg by mouth at bedtime as needed for sleep.     [provider]     Family History  Problem Relation Age of Onset  . Diabetes Father   . Diabetes Brother   . Diabetes Brother     Social History   Social History  . Marital status: Single    Spouse name: N/A  . Number of children: 3  . Years of education: N/A   Occupational History  .  [employer]   Social History Main Topics  . Smoking status: Former Smoker    Packs/day: 0.50    Quit date: 08/21/2011  . Smokeless tobacco: Never Used  . Alcohol use No     Comment: Occasional beer on the weekends  . Drug use: No     Comment: abused cocaine in the 80's  . Sexual activity: No   Other Topics Concern  . None   Social History Narrative   Single, never married.  Lives alone.  3 daughters, 16 grandchildren who are not very involved in the patient's care.  Quit smoking and heavy drinking 3.5 years ago.     ECOG Status: 0 - Asymptomatic  Review of Systems: A 12 point ROS discussed and pertinent positives are indicated in the HPI above.  All other systems are negative.  Review of Systems  Vital Signs: BP (!) 156/74 (BP Location: Left Arm, Patient Position: Sitting, Cuff Size: Normal)   Pulse (!) 56   Temp 97.6 F (36.4 C)   Resp 18   SpO2 95%    Physical Exam  Constitutional: He is oriented to person, place, and time. He appears well-developed and well-nourished. No distress.  HENT:  Head: Normocephalic and atraumatic.  Eyes: No scleral icterus.  Cardiovascular: Normal rate and regular rhythm.   Pulmonary/Chest: Effort normal and breath sounds normal.  Abdominal: Soft. He exhibits no distension.  Neurological: He is alert and oriented to person, place, and time.  Skin: Skin is warm and dry.  Psychiatric: He has a normal mood and affect. His behavior is normal.  Nursing note and vitals reviewed.    Imaging: Mr Abdomen Wwo Contrast  Result Date: 08/13/2017 CLINICAL DATA:  Three year followup of thermal ablation of out of cellular carcinoma. EXAM: MRI ABDOMEN WITHOUT AND WITH CONTRAST TECHNIQUE: Multiplanar multisequence MR imaging of the abdomen was performed both before and after the administration of intravenous contrast. CONTRAST:  58mL EOVIST GADOXETATE DISODIUM  0.25 MOL/L IV SOLN COMPARISON:  MRI 02/11/2017 and 07/23/2016 FINDINGS: Lower chest: The lung bases are grossly clear. No pleural or pericardial effusion. Hepatobiliary: Advanced cirrhotic changes involving the liver with areas of confluent hepatic fibrosis. Stable ablation defect involving the right hepatic lobe in segment 7. No findings suspicious for recurrent tumor. Stable cholelithiasis. No findings for acute cholecystitis. No common bile duct dilatation. Pancreas:  No mass, inflammation or ductal dilatation. Spleen:  Stable splenomegaly.  No focal lesions. Adrenals/Urinary Tract: The adrenal glands and kidneys are unremarkable and stable. Stomach/Bowel: The stomach, duodenum, visualized small bowel and visualized colon are grossly normal in stable. Vascular/Lymphatic: Stable atherosclerotic calcifications involving the aorta but no aneurysm or dissection. The branch vessels are patent. The major venous structures are patent. There are portal venous collaterals. No  mesenteric or retroperitoneal mass or lymphadenopathy. Small scattered lymph nodes are stable. Other:  No ascites or abdominal wall hernia. Musculoskeletal: No significant bony findings. IMPRESSION: 1. Stable ablation defect involving the right hepatic lobe. No findings suspicious for recurrent tumor. 2. No new early arterial phase enhancing lesions to suggest dysplastic nodules or HCC. 3. Chronic changes of hepatic cirrhosis with areas of confluent hepatic fibrosis. Associated portal venous hypertension, portal venous collaterals and splenomegaly but no ascites. 4. Cholelithiasis. Electronically Signed   By: Marijo Sanes M.D.   On: 08/13/2017 13:10    Labs:  CBC:  Recent Labs  01/27/17 1300 07/23/17 0907  WBC 4.1 3.7*  HGB 16.4 13.9  HCT 49.1 41.0  PLT 53* 48*    COAGS:  Recent Labs  01/27/17 1300 07/23/17 0907  INR 1.1 1.1    BMP:  Recent Labs  01/27/17 1300 07/23/17 0907  NA 137 137  K 4.2 3.6  CL 101 105  CO2 28 20  GLUCOSE 63* 123*  BUN 14 12  CALCIUM 9.1 8.8  CREATININE 1.15 0.82  GFRNONAA  --  >89  GFRAA  --  >89    LIVER FUNCTION TESTS:  Recent Labs  01/27/17 1300 07/23/17 0907  BILITOT 0.7 0.8  AST 20 12  ALT 21 11  ALKPHOS 110 103  PROT 7.0 6.3  ALBUMIN 4.2 4.0    TUMOR MARKERS:  Recent Labs  01/27/17 1300 07/23/17 0907  AFPTM 24.9* 49.5*    Assessment and Plan:  Mr. Mitcham continues to do extremely well. He is maintaining his sobriety and has no current active clinical complaints. His 3 year follow-up MRI of the abdomen today demonstrated no evidence of recurrent or new disease. His liver morphology is slowly changing over time likely due to progression of his underlying cirrhosis.  Today his AFP was elevated compared to prior. This is likely due to his intrinsic liver disease, however we will keep a close eye on it.  1.) I would like to repeat his serum AFP in 6 months. He does not need to come into clinic for this. We will  review the results and relay them to him.  2.) Return to clinic in 1 year with repeat MRI of the abdomen with Eovist.  Electronically Signed: Laurence Ferrari, [place] 08/13/2017, 4:10 PM   I spent a total of 15 Minutes in face to face in clinical consultation, greater than 50% of which was counseling/coordinating care for cirrhosis, [place].

## 2017-08-21 ENCOUNTER — Ambulatory Visit: Payer: Self-pay | Admitting: Family Medicine

## 2017-08-24 ENCOUNTER — Other Ambulatory Visit: Payer: Self-pay | Admitting: Family Medicine

## 2017-08-24 DIAGNOSIS — L299 Pruritus, unspecified: Secondary | ICD-10-CM

## 2017-08-27 ENCOUNTER — Other Ambulatory Visit: Payer: Self-pay | Admitting: Family Medicine

## 2017-08-27 DIAGNOSIS — G629 Polyneuropathy, unspecified: Secondary | ICD-10-CM

## 2017-09-05 ENCOUNTER — Other Ambulatory Visit: Payer: Self-pay | Admitting: Family Medicine

## 2017-09-11 ENCOUNTER — Other Ambulatory Visit: Payer: Self-pay | Admitting: Family Medicine

## 2017-09-13 ENCOUNTER — Other Ambulatory Visit: Payer: Self-pay | Admitting: Family Medicine

## 2017-09-14 ENCOUNTER — Other Ambulatory Visit: Payer: Self-pay | Admitting: Family Medicine

## 2017-09-14 DIAGNOSIS — L299 Pruritus, unspecified: Secondary | ICD-10-CM

## 2017-09-28 ENCOUNTER — Other Ambulatory Visit: Payer: Self-pay | Admitting: Family Medicine

## 2017-09-28 DIAGNOSIS — G629 Polyneuropathy, unspecified: Secondary | ICD-10-CM

## 2017-09-29 ENCOUNTER — Encounter: Payer: Self-pay | Admitting: Interventional Radiology

## 2017-10-03 ENCOUNTER — Other Ambulatory Visit: Payer: Self-pay | Admitting: Family Medicine

## 2017-10-03 DIAGNOSIS — L299 Pruritus, unspecified: Secondary | ICD-10-CM

## 2017-10-11 ENCOUNTER — Other Ambulatory Visit: Payer: Self-pay | Admitting: Family Medicine

## 2017-11-09 ENCOUNTER — Other Ambulatory Visit: Payer: Self-pay | Admitting: Family Medicine

## 2017-11-10 NOTE — Telephone Encounter (Signed)
No additional refills until patient is seen in office

## 2017-11-14 ENCOUNTER — Telehealth: Payer: Self-pay

## 2017-11-14 NOTE — Telephone Encounter (Signed)
Patient notified that script was sent on 11/10/2017

## 2017-11-18 ENCOUNTER — Telehealth: Payer: Self-pay

## 2017-11-19 ENCOUNTER — Other Ambulatory Visit: Payer: Self-pay | Admitting: Family Medicine

## 2017-11-19 NOTE — Progress Notes (Signed)
Patient notified

## 2017-11-19 NOTE — Telephone Encounter (Signed)
Patient notified that script won't be filled due to allergies and office visit

## 2017-11-19 NOTE — Progress Notes (Signed)
Contact patient to advise folic acid is listed as allergy, therefore medication will not be filled. This is not a medication that was prescribed at practice however,an again is noted as an allergy. Clarify that he is not request iron tablets as this is the last medication I prescribed and my notes to pharmacy indicates that he needs an office visit for any additional refills.  Jeffrey Snow. Kenton Kingfisher, MSN, FNP-C [facility]  [address] [place], [place] [address] [phone]

## 2017-12-08 ENCOUNTER — Other Ambulatory Visit: Payer: Self-pay | Admitting: Family Medicine

## 2017-12-09 ENCOUNTER — Other Ambulatory Visit: Payer: Self-pay | Admitting: Family Medicine

## 2017-12-11 ENCOUNTER — Other Ambulatory Visit: Payer: Self-pay | Admitting: Family Medicine

## 2017-12-13 ENCOUNTER — Other Ambulatory Visit: Payer: Self-pay | Admitting: Family Medicine

## 2017-12-17 ENCOUNTER — Ambulatory Visit (INDEPENDENT_AMBULATORY_CARE_PROVIDER_SITE_OTHER): Payer: Medicaid Other | Admitting: Family Medicine

## 2017-12-17 ENCOUNTER — Other Ambulatory Visit: Payer: Self-pay | Admitting: Family Medicine

## 2017-12-17 ENCOUNTER — Encounter: Payer: Self-pay | Admitting: Family Medicine

## 2017-12-17 VITALS — BP 138/64 | HR 60 | Temp 97.7°F | Resp 16 | Ht 68.5 in | Wt 176.0 lb

## 2017-12-17 DIAGNOSIS — I739 Peripheral vascular disease, unspecified: Secondary | ICD-10-CM | POA: Diagnosis not present

## 2017-12-17 DIAGNOSIS — Z131 Encounter for screening for diabetes mellitus: Secondary | ICD-10-CM | POA: Diagnosis not present

## 2017-12-17 DIAGNOSIS — C220 Liver cell carcinoma: Secondary | ICD-10-CM | POA: Diagnosis not present

## 2017-12-17 DIAGNOSIS — L299 Pruritus, unspecified: Secondary | ICD-10-CM

## 2017-12-17 DIAGNOSIS — G629 Polyneuropathy, unspecified: Secondary | ICD-10-CM

## 2017-12-17 LAB — POCT URINALYSIS DIP (DEVICE)
Bilirubin Urine: NEGATIVE
Glucose, UA: NEGATIVE mg/dL
Hgb urine dipstick: NEGATIVE
Ketones, ur: NEGATIVE mg/dL
Leukocytes, UA: NEGATIVE
Nitrite: NEGATIVE
Protein, ur: NEGATIVE mg/dL
Specific Gravity, Urine: <=1.005 (ref 1.005–1.030)
Urobilinogen, UA: 0.2 mg/dL (ref 0.0–1.0)
pH: 5.5 (ref 5.0–8.0)

## 2017-12-17 LAB — POCT GLYCOSYLATED HEMOGLOBIN (HGB A1C): Hemoglobin A1C: 5.4

## 2017-12-17 MED ORDER — HYDROXYZINE HCL 10 MG PO TABS: ORAL_TABLET | ORAL | 3 refills | Status: DC

## 2017-12-17 MED ORDER — FUROSEMIDE 20 MG PO TABS: 20.0000 mg | ORAL_TABLET | Freq: Every day | ORAL | 1 refills | Status: DC

## 2017-12-17 MED ORDER — PENTOXIFYLLINE ER 400 MG PO TBCR: EXTENDED_RELEASE_TABLET | ORAL | 2 refills | Status: DC

## 2017-12-17 MED ORDER — PANTOPRAZOLE SODIUM 40 MG PO TBEC: 40.0000 mg | DELAYED_RELEASE_TABLET | Freq: Two times a day (BID) | ORAL | 1 refills | Status: DC

## 2017-12-17 MED ORDER — GABAPENTIN 100 MG PO CAPS: ORAL_CAPSULE | ORAL | 2 refills | Status: DC

## 2017-12-17 MED ORDER — SPIRONOLACTONE 25 MG PO TABS: 75.0000 mg | ORAL_TABLET | Freq: Every day | ORAL | 4 refills | Status: DC

## 2017-12-17 NOTE — Progress Notes (Signed)
poct

## 2017-12-17 NOTE — Progress Notes (Signed)
Patient ID: Jeffrey Snow, male    DOB: May 20, 1956, 62 y.o.   MRN: [id number]  PCP: Scot Jun, FNP  Chief Complaint  Patient presents with  . Follow-up    leg pain  . Medication Refill  . Erectile Dysfunction    Subjective:  HPI Jeffrey Snow is a 62 y.o. male , with liver carcinoma, PAD, anxiety, GERD presents for evaluation of chronic leg pain and medication refill.  Vascular Disease  [place] has a medical history significant for for peripheral arterial disease.  He was previously managed by Dr. Bridgett Larsson for vascular disease. His last ABI was performed in 2014 and was significant for severe peripheral vascular disease.  ABI left 0.69 and ABI right 0.70. Patient has not followed up with vascular medicine recently. Today he complains of chronic ongoing leg pain.  Pain has progressed over the last year.  Pain is more significant with walking and at bedtime. Improves with sitting. He is currently prescribed tramadol for restless leg syndrome however he reports medication is not effective in resolving his leg pain.    Hepatocellular Cancer  He also suffers from liver cancer.  Last visit with oncology documented in 2015.  Patient reports he was told that his disease process was stable and has not followed up with oncology since 2015. He has a history of hepatitis C and there is no documented treatment of chronic infection. Last documented note from oncologist noted the patient was to follow-up with him in July 2015.  He currently takes chronically spironolactone and furosemide to prevent abdominal ascites.  He denies any increase in abdominal girth, nausea, vomiting, or yellowing of urine, stools, or skin.  GERD Patient has a history of a GI bleed back in 2016.  He had an upper endoscopy performed in September 2016 to evaluate him for esophageal varices which he was negative.  GI recommended chronic PPI therapy.  Patient reports adherence to therapy.  He denies any recent abdominal  pain, hemoptysis, food intolerances, unintentional weight loss, or diarrhea. Social History   Socioeconomic History  . Marital status: Single    Spouse name: Not on file  . Number of children: 3  . Years of education: Not on file  . Highest education level: Not on file  Social Needs  . Financial resource strain: Not on file  . Food insecurity - worry: Not on file  . Food insecurity - inability: Not on file  . Transportation needs - medical: Not on file  . Transportation needs - non-medical: Not on file  Occupational History    Employer: [employer]  Tobacco Use  . Smoking status: Former Smoker    Packs/day: 0.50    Last attempt to quit: 08/21/2011    Years since quitting: 6.3  . Smokeless tobacco: Never Used  Substance and Sexual Activity  . Alcohol use: No    Alcohol/week: 0.0 oz    Comment: Occasional beer on the weekends  . Drug use: No    Comment: abused cocaine in the 80's  . Sexual activity: No  Other Topics Concern  . Not on file  Social History Narrative   Single, never married.  Lives alone.  3 daughters, 16 grandchildren who are not very involved in the patient's care.  Quit smoking and heavy drinking 3.5 years ago.     Family History  Problem Relation Age of Onset  . Diabetes Father   . Diabetes Brother   . Diabetes Brother    Review of  Systems  Constitutional: Negative.   HENT: Negative.   Respiratory: Negative.   Cardiovascular: Negative.   Gastrointestinal: Negative for abdominal distention, constipation, diarrhea, nausea, rectal pain and vomiting.  Genitourinary: Negative.        Erectile Dysfunction symptoms   Musculoskeletal:       Persistent leg pain bilaterally  Skin: Negative.   Psychiatric/Behavioral: The patient is nervous/anxious and is hyperactive.        Continue to follow-up regularly with neuropsychiatric    Patient Active Problem List   Diagnosis Date Noted  . Acute upper GI bleed 09/05/2015  . Lactic acidosis 09/05/2015  .  Hyperkalemia 09/05/2015  . Hemorrhagic shock ([place]) 09/05/2015  . Urinary tract infection, acute 08/31/2015  . Anemia, blood loss 12/16/2014  . Melena 12/16/2014  . Anemia 12/16/2014  . Abdominal pain, chronic, right upper quadrant 07/06/2014  . [place] (hepatocellular carcinoma) ([place]) 06/09/2014  . Other pancytopenia ([place]) 04/27/2014  . Cellulitis of leg 04/24/2014  . Elevated troponin 04/23/2014  . Normocytic anemia 04/21/2014  . Cirrhosis, alcoholic ([place]) 78/67/6720  . Hyponatremia 04/21/2014  . Hypokalemia 04/21/2014  . Metabolic acidosis 94/70/9628  . Hepatitis C 04/21/2014  . Abnormal MRI of abdomen, liver  04/21/2014  . Leg pain 04/21/2014  . Rash and nonspecific skin eruption 04/21/2014  . T wave inversion in EKG 04/21/2014  . Right foot pain 03/31/2014  . Hyperglycemia 03/03/2014  . Vitamin D deficiency disease 03/03/2014  . HTN (hypertension) 03/03/2014  . Elevated LFTs 03/03/2014  . Screening for colon cancer 03/03/2014  . Annual physical exam 03/03/2014  . PVD (peripheral vascular disease) ([place]) 12/03/2013  . PAD (peripheral artery disease) ([place]) 10/26/2013  . Arterial insufficiency ([place]) 10/26/2013  . Fall 10/26/2013  . Depression 09/22/2013  . Insomnia 09/22/2013  . Claudication of left lower extremity ([place])   . Edema 08/20/2013  . Pain in limb 08/20/2013  . Atherosclerosis of native arteries of the extremities with intermittent claudication 08/20/2013  . Thrombocytopenia ([place]) 10/12/2012    Allergies  Allergen Reactions  . Folic Acid Other (See Comments)    Makes stomach feel funny    Prior to Admission medications   Medication Sig Start Date End Date Taking? Authorizing Provider  ALPRAZolam Duanne Moron) 0.5 MG tablet Take 0.5 mg by mouth 2 (two) times daily.    Yes [provider]  FEROSUL 325 (65 Fe) MG tablet TAKE 1 TABLET(325 MG) BY MOUTH DAILY WITH BREAKFAST 11/10/17  Yes Scot Jun, FNP  FLUoxetine (PROZAC) 40 MG capsule Take 1 capsule by  mouth every morning. 05/02/15  Yes [provider]  furosemide (LASIX) 20 MG tablet TAKE 1 TABLET BY MOUTH EVERY DAY 12/11/17  Yes Scot Jun, FNP  gabapentin (NEURONTIN) 100 MG capsule TAKE 1 CAPSULE(100 MG) BY MOUTH THREE TIMES DAILY 09/29/17  Yes Scot Jun, FNP  hydrOXYzine (ATARAX/VISTARIL) 10 MG tablet TAKE 1 TABLET(10 MG) BY MOUTH THREE TIMES DAILY AS NEEDED 10/03/17  Yes Scot Jun, FNP  pantoprazole (PROTONIX) 40 MG tablet Take 1 tablet (40 mg total) by mouth 2 (two) times daily before a meal. 09/09/15  Yes Eugenie Filler, MD  pentoxifylline (TRENTAL) 400 MG CR tablet TAKE 1 TABLET(400 MG) BY MOUTH THREE TIMES DAILY WITH MEALS 10/03/17  Yes Scot Jun, FNP  spironolactone (ALDACTONE) 25 MG tablet Take 3 tablets (75 mg total) by mouth every morning. Resume in 2-3 days. 09/12/15  Yes Eugenie Filler, MD  vitamin C (ASCORBIC ACID) 250 MG tablet Take  500 mg by mouth daily.   Yes [provider]  bisacodyl (DULCOLAX) 5 MG EC tablet Take 5 mg by mouth daily as needed for mild constipation or moderate constipation. Reported on 04/03/2016    [provider]  folic acid (FOLVITE) 101 MCG tablet Take 400 mcg by mouth daily.    [provider]  zolpidem (AMBIEN) 10 MG tablet Take 10 mg by mouth at bedtime as needed for sleep.     [provider]    Past Medical, Surgical Family and Social History reviewed and updated.    Objective:   Today's Vitals   12/17/17 1044 12/17/17 1118  BP: (!) 142/70 138/64  Pulse: 60   Resp: 16   Temp: 97.7 F (36.5 C)   TempSrc: Oral   SpO2: 98%   Weight: 176 lb (79.8 kg)   Height: 5' 8.5" (1.74 m)     Wt Readings from Last 3 Encounters:  12/17/17 176 lb (79.8 kg)  04/24/17 169 lb (76.7 kg)  02/17/17 178 lb (80.7 kg)    Physical Exam  Constitutional: He appears well-developed and well-nourished.  HENT:  Head: Normocephalic and atraumatic.  Right Ear: External ear normal.   Left Ear: External ear normal.  Eyes: Conjunctivae and EOM are normal. Pupils are equal, round, and reactive to light. No scleral icterus.  Neck: Normal range of motion. Neck supple. No thyromegaly present.  Cardiovascular: Normal rate, regular rhythm, normal heart sounds and intact distal pulses.  Pulmonary/Chest: Effort normal and breath sounds normal.  Abdominal: Soft. Bowel sounds are normal. He exhibits no distension and no mass. There is no tenderness. There is no rebound and no guarding.  Lymphadenopathy:    He has no cervical adenopathy.  Psychiatric: Thought content normal. His mood appears anxious. His speech is rapid and/or pressured. He is agitated and hyperactive. Cognition and memory are normal. He expresses impulsivity.  Patient was talking incessantly throughout the visit.  Very difficult to carry a conversation and obtain a detailed history as patient with persistently talking and not pausing to listen and answer questions without multiple redirections.   Assessment & Plan:  1. PAD (peripheral artery disease) ([place]), last ABI performed in 2014 was significant for severe peripheral arterial disease.  I am referring patient back to vascular surgery for further evaluation and follow-up as he is complaining with claudication symptoms worsening over the course of the year.  For now recommend continuing Trental as prescribed.  Patient is on chronic benzodiazepines will not prescribe any opioid medication for pain. - Ambulatory referral to Vascular Surgery  2. Screening for diabetes mellitus, A1C 5.4  3. [place] (hepatocellular carcinoma) ([place]), according to last notes from Dr. Jana Hakim,  oncologist, patient was to follow-up for further evaluation however patient has not seen oncologist since 2015.  Will reach out to oncology in efforts to schedule a follow-up appointment for patient. LFT's have remained stable. Checking CMP today. Continue furosemide and spironolactone as prescribed.  4.  Pruritic condition, chronic likely secondary to liver disease. Continue hydroxyzine.  5. Neuropathy, likely secondary to arterial disease. Recommend follow-up with vascular. For now continue Gabapentin.  Meds ordered this encounter  Medications  . spironolactone (ALDACTONE) 25 MG tablet    Sig: Take 3 tablets (75 mg total) by mouth daily. Resume in 2-3 days.    Dispense:  90 tablet    Refill:  4    Order Specific Question:   Supervising Provider    Answer:   Tresa Garter [id number]  .  pentoxifylline (TRENTAL) 400 MG CR tablet    Sig: TAKE 1 TABLET(400 MG) BY MOUTH THREE TIMES DAILY WITH MEALS    Dispense:  90 tablet    Refill:  2    Order Specific Question:   Supervising Provider    Answer:   Tresa Garter [id number]  . pantoprazole (PROTONIX) 40 MG tablet    Sig: Take 1 tablet (40 mg total) by mouth 2 (two) times daily before a meal.    Dispense:  180 tablet    Refill:  1    Order Specific Question:   Supervising Provider    Answer:   Tresa Garter [id number]  . hydrOXYzine (ATARAX/VISTARIL) 10 MG tablet    Sig: TAKE 1 TABLET(10 MG) BY MOUTH THREE TIMES DAILY AS NEEDED    Dispense:  60 tablet    Refill:  3    Order Specific Question:   Supervising Provider    Answer:   Tresa Garter [id number]  . gabapentin (NEURONTIN) 100 MG capsule    Sig: TAKE 1 CAPSULE(100 MG) BY MOUTH THREE TIMES DAILY    Dispense:  90 capsule    Refill:  2    Order Specific Question:   Supervising Provider    Answer:   Tresa Garter [id number]  . DISCONTD: furosemide (LASIX) 20 MG tablet    Sig: Take 1 tablet (20 mg total) by mouth daily.    Dispense:  90 tablet    Refill:  1    Patient will not get anymore until appointment    Order Specific Question:   Supervising Provider    Answer:   Tresa Garter [[id number]]  . furosemide (LASIX) 20 MG tablet    Sig: Take 1 tablet (20 mg total) by mouth daily.    Dispense:  90 tablet    Refill:  1    Order Specific  Question:   Supervising Provider    Answer:   Tresa Garter [id number]   RTC: 6 months for chronic condition management.   Carroll Sage. Kenton Kingfisher, MSN, FNP-C [facility]  [address] [place], [place] [address] [phone]

## 2017-12-17 NOTE — Patient Instructions (Addendum)
As we discussed, due to the chronicity of your medical history, I do not recommend medication therapy for erectile dysfunction.  You are currently prescribed Prozac which can induce erectile dysfunction, therefore you may want to discuss alternative options for treatment of depression.   You have been referred to Vascular Surgeon Dr. Bridgett Larsson for further evaluation of PAD.    Peripheral Vascular Disease Peripheral vascular disease (PVD) is a disease of the blood vessels that are not part of your heart and brain. A simple term for PVD is poor circulation. In most cases, PVD narrows the blood vessels that carry blood from your heart to the rest of your body. This can result in a decreased supply of blood to your arms, legs, and internal organs, like your stomach or kidneys. However, it most often affects a person's lower legs and feet. There are two types of PVD.  Organic PVD. This is the more common type. It is caused by damage to the structure of blood vessels.  Functional PVD. This is caused by conditions that make blood vessels contract and tighten (spasm).  Without treatment, PVD tends to get worse over time. PVD can also lead to acute ischemic limb. This is when an arm or limb suddenly has trouble getting enough blood. This is a medical emergency. Follow these instructions at home:  Take medicines only as told by your doctor.  Do not use any tobacco products, including cigarettes, chewing tobacco, or electronic cigarettes. If you need help quitting, ask your doctor.  Lose weight if you are overweight, and maintain a healthy weight as told by your doctor.  Eat a diet that is low in fat and cholesterol. If you need help, ask your doctor.  Exercise regularly. Ask your doctor for some good activities for you.  Take good care of your feet. ? Wear comfortable shoes that fit well. ? Check your feet often for any cuts or sores. Contact a doctor if:  You have cramps in your legs while  walking.  You have leg pain when you are at rest.  You have coldness in a leg or foot.  Your skin changes.  You are unable to get or have an erection (erectile dysfunction).  You have cuts or sores on your feet that are not healing. Get help right away if:  Your arm or leg turns cold and blue.  Your arms or legs become red, warm, swollen, painful, or numb.  You have chest pain or trouble breathing.  You suddenly have weakness in your face, arm, or leg.  You become very confused or you cannot speak.  You suddenly have a very bad headache.  You suddenly cannot see. This information is not intended to replace advice given to you by your health care provider. Make sure you discuss any questions you have with your health care provider. Document Released: 02/26/2010 Document Revised: 05/09/2016 Document Reviewed: 05/12/2014 Elsevier Interactive Patient Education  2017 [employer].

## 2017-12-18 LAB — COMPREHENSIVE METABOLIC PANEL
ALT: 15 IU/L (ref 0–44)
AST: 17 IU/L (ref 0–40)
Albumin/Globulin Ratio: 1.8 (ref 1.2–2.2)
Albumin: 4.8 g/dL (ref 3.6–4.8)
Alkaline Phosphatase: 123 IU/L — ABNORMAL HIGH (ref 39–117)
BUN/Creatinine Ratio: 9 — ABNORMAL LOW (ref 10–24)
BUN: 9 mg/dL (ref 8–27)
Bilirubin Total: 0.6 mg/dL (ref 0.0–1.2)
CO2: 25 mmol/L (ref 20–29)
Calcium: 9.4 mg/dL (ref 8.6–10.2)
Chloride: 101 mmol/L (ref 96–106)
Creatinine, Ser: 0.96 mg/dL (ref 0.76–1.27)
GFR calc Af Amer: 98 mL/min/{1.73_m2} (ref >59)
GFR calc non Af Amer: 84 mL/min/{1.73_m2} (ref >59)
Globulin, Total: 2.6 g/dL (ref 1.5–4.5)
Glucose: 81 mg/dL (ref 65–99)
Potassium: 4.2 mmol/L (ref 3.5–5.2)
Sodium: 140 mmol/L (ref 134–144)
Total Protein: 7.4 g/dL (ref 6.0–8.5)

## 2017-12-18 LAB — CBC WITH DIFFERENTIAL/PLATELET
Basophils Absolute: 0 10*3/uL (ref 0.0–0.2)
Basos: 0 %
EOS (ABSOLUTE): 0.1 10*3/uL (ref 0.0–0.4)
Eos: 3 %
Hematocrit: 42.8 % (ref 37.5–51.0)
Hemoglobin: 14.9 g/dL (ref 13.0–17.7)
Immature Grans (Abs): 0 10*3/uL (ref 0.0–0.1)
Immature Granulocytes: 0 %
Lymphocytes Absolute: 1.2 10*3/uL (ref 0.7–3.1)
Lymphs: 26 %
MCH: 30.8 pg (ref 26.6–33.0)
MCHC: 34.8 g/dL (ref 31.5–35.7)
MCV: 89 fL (ref 79–97)
Monocytes Absolute: 0.5 10*3/uL (ref 0.1–0.9)
Monocytes: 12 %
Neutrophils Absolute: 2.6 10*3/uL (ref 1.4–7.0)
Neutrophils: 59 %
Platelets: 51 10*3/uL — CL (ref 150–379)
RBC: 4.83 x10E6/uL (ref 4.14–5.80)
RDW: 14.4 % (ref 12.3–15.4)
WBC: 4.5 10*3/uL (ref 3.4–10.8)

## 2017-12-21 ENCOUNTER — Telehealth: Payer: Self-pay | Admitting: Family Medicine

## 2017-12-21 ENCOUNTER — Encounter: Payer: Self-pay | Admitting: Family Medicine

## 2017-12-21 NOTE — Telephone Encounter (Signed)
Carrie please contact oncology services regarding patient.  In review of his chart I see he has not been seen by oncology since 2014 and he has a diagnosis of hepatocellular carcinoma.  Recent labs shows an elevated alkaline phosphate and he continues to have chronic thrombocytopenia.  The last provider that he saw at the cancer center was Dr. Lurline Del. If no follow-up indicated, please let me know.  Thanks!   Carroll Sage. Kenton Kingfisher, MSN, FNP-C [facility]  [address] [place], [place] [address] [phone]

## 2017-12-23 NOTE — Telephone Encounter (Signed)
Patient will need a new referral to be seen by oncology as it has been more than 3 years per cancer center.

## 2017-12-23 NOTE — Telephone Encounter (Signed)
Left a vm for Ebony Hail at [facility] oncology to called back regarding patient care

## 2017-12-24 NOTE — Telephone Encounter (Signed)
Completed referral 

## 2017-12-24 NOTE — Addendum Note (Signed)
Addended by: Scot Jun on: 12/24/2017 11:40 PM   Modules accepted: Orders

## 2017-12-25 ENCOUNTER — Telehealth: Payer: Self-pay

## 2017-12-25 NOTE — Telephone Encounter (Signed)
Patient called stating that he received a call regarding oncology and that he already has a oncologist name Jacqulynn Cadet at [phone]  that he has been seeing for 4 years now. Patient wanted to speak with you about the referral. Patient is aware that you are out of office today and will return tomorrow.

## 2017-12-26 NOTE — Telephone Encounter (Signed)
Left a voicemail for patient to return call regarding referral.  Jeffrey Snow. Kenton Kingfisher, MSN, FNP-C [facility]  [address] [place], [place] [address] [phone]

## 2018-01-05 ENCOUNTER — Other Ambulatory Visit: Payer: Self-pay | Admitting: Oncology

## 2018-01-05 ENCOUNTER — Other Ambulatory Visit: Payer: Self-pay | Admitting: *Deleted

## 2018-01-05 DIAGNOSIS — C220 Liver cell carcinoma: Secondary | ICD-10-CM

## 2018-01-06 ENCOUNTER — Telehealth: Payer: Self-pay

## 2018-01-06 NOTE — Telephone Encounter (Signed)
Call was not returned to patient. Patient spoke at length with the Oncology RN coordinator and declined referral. I spoke with patient at length on yesterday and recommended follow-up and explained most recent lab results. However speaking with in by phone when he will not permit me to talk nor will he actively listen to the conversation is counterproductive.  Patient is welcomed to schedule an appointment to discuss this or any other concerns.   The following is the recent staff message from the oncology navigator RN, regarding recent conversation with Jeffrey Snow   ----- Message -----  From: Arna Snipe, RN  Sent: 01/06/2018 11:25 AM  To: Scot Jun, FNP  Subject: Referral                     Hello Joelene Millin,   I am the GI Oncology Nurse Navigator working with Dr. Benay Spice and Dr. Burr Medico. I received your referral for this patient today and called him to discuss his relationship with Dr. Rainey Pines. Dr. Laurence Ferrari is the radiologist that ablated his liver four years ago and is following him closely. After spending 45 minutes on the phone with [name], it is apparent that he is compliant with his surveillance and has labs drawn every six months which Dr. Laurence Ferrari reviews. At this point the patient is interested in a referral. Please let me know if I can be of further assistance.   Dawn [employer]

## 2018-01-06 NOTE — Telephone Encounter (Signed)
Patient would like for provider to give him a call regarding his oncologist referral.

## 2018-01-06 NOTE — Progress Notes (Signed)
I received a referral for this patient today from Molli Barrows FNP, [name]. I called patient to discuss his relationship with Dr. Rainey Pines. Dr. Laurence Ferrari is the radiologist that ablated his liver four years ago and is following him closely. After spending 45 minutes on the phone with Jeffrey Snow, it is apparent that he is compliant with his surveillance and has labs drawn every six months which Dr. Laurence Ferrari reviews. At this point the patient is interested in a referral.

## 2018-01-07 LAB — AFP TUMOR MARKER: AFP-Tumor Marker: 81.3 ng/mL — ABNORMAL HIGH (ref <6.1)

## 2018-01-07 NOTE — Telephone Encounter (Signed)
Patient notified and states that he spoke with Dr. Virgina Evener and he doesn't need to follow up with oncology.

## 2018-01-08 ENCOUNTER — Ambulatory Visit: Payer: Self-pay | Admitting: Hematology

## 2018-01-08 ENCOUNTER — Other Ambulatory Visit (HOSPITAL_COMMUNITY): Payer: Self-pay | Admitting: Interventional Radiology

## 2018-01-08 DIAGNOSIS — C220 Liver cell carcinoma: Secondary | ICD-10-CM

## 2018-01-22 ENCOUNTER — Other Ambulatory Visit (HOSPITAL_COMMUNITY): Payer: Self-pay

## 2018-01-22 ENCOUNTER — Other Ambulatory Visit: Payer: Self-pay

## 2018-02-26 NOTE — Telephone Encounter (Signed)
Medication refilled

## 2018-03-02 ENCOUNTER — Telehealth: Payer: Self-pay

## 2018-03-03 NOTE — Telephone Encounter (Signed)
Left a vm for patient to callback 

## 2018-03-04 MED ORDER — SPIRONOLACTONE 25 MG PO TABS: 75.0000 mg | ORAL_TABLET | Freq: Every day | ORAL | 0 refills | Status: DC

## 2018-03-04 NOTE — Telephone Encounter (Signed)
Medication refilled

## 2018-03-13 ENCOUNTER — Other Ambulatory Visit: Payer: Self-pay | Admitting: Family Medicine

## 2018-03-13 DIAGNOSIS — L299 Pruritus, unspecified: Secondary | ICD-10-CM

## 2018-03-29 ENCOUNTER — Other Ambulatory Visit: Payer: Self-pay | Admitting: Family Medicine

## 2018-03-29 DIAGNOSIS — L299 Pruritus, unspecified: Secondary | ICD-10-CM

## 2018-03-29 DIAGNOSIS — G629 Polyneuropathy, unspecified: Secondary | ICD-10-CM

## 2018-03-31 ENCOUNTER — Telehealth: Payer: Self-pay

## 2018-03-31 NOTE — Telephone Encounter (Signed)
Left a vm for patient that script was sent to pharmacy

## 2018-04-01 ENCOUNTER — Other Ambulatory Visit: Payer: Self-pay

## 2018-04-01 DIAGNOSIS — G629 Polyneuropathy, unspecified: Secondary | ICD-10-CM

## 2018-04-02 ENCOUNTER — Other Ambulatory Visit: Payer: Self-pay | Admitting: Radiology

## 2018-04-02 DIAGNOSIS — C220 Liver cell carcinoma: Secondary | ICD-10-CM

## 2018-04-07 LAB — COMPLETE METABOLIC PANEL WITH GFR
AG Ratio: 1.8 (calc) (ref 1.0–2.5)
ALT: 18 U/L (ref 9–46)
AST: 16 U/L (ref 10–35)
Albumin: 4.8 g/dL (ref 3.6–5.1)
Alkaline phosphatase (APISO): 105 U/L (ref 40–115)
BUN: 10 mg/dL (ref 7–25)
CO2: 30 mmol/L (ref 20–32)
Calcium: 9.3 mg/dL (ref 8.6–10.3)
Chloride: 101 mmol/L (ref 98–110)
Creat: 1.02 mg/dL (ref 0.70–1.25)
GFR, Est African American: 90 mL/min/{1.73_m2} (ref >=60)
GFR, Est Non African American: 78 mL/min/{1.73_m2} (ref >=60)
Globulin: 2.6 g/dL (calc) (ref 1.9–3.7)
Glucose, Bld: 102 mg/dL — ABNORMAL HIGH (ref 65–99)
Potassium: 3.7 mmol/L (ref 3.5–5.3)
Sodium: 139 mmol/L (ref 135–146)
Total Bilirubin: 1.2 mg/dL (ref 0.2–1.2)
Total Protein: 7.4 g/dL (ref 6.1–8.1)

## 2018-04-07 LAB — CBC
HCT: 46.7 % (ref 38.5–50.0)
Hemoglobin: 15.9 g/dL (ref 13.2–17.1)
MCH: 30.2 pg (ref 27.0–33.0)
MCHC: 34 g/dL (ref 32.0–36.0)
MCV: 88.8 fL (ref 80.0–100.0)
MPV: 12 fL (ref 7.5–12.5)
Platelets: 58 10*3/uL — ABNORMAL LOW (ref 140–400)
RBC: 5.26 10*6/uL (ref 4.20–5.80)
RDW: 13.2 % (ref 11.0–15.0)
WBC: 4.8 10*3/uL (ref 3.8–10.8)

## 2018-04-07 LAB — AFP TUMOR MARKER: AFP-Tumor Marker: 115.2 ng/mL — ABNORMAL HIGH (ref <6.1)

## 2018-04-07 LAB — PROTIME-INR
INR: 1.1
Prothrombin Time: 11.9 s — ABNORMAL HIGH (ref 9.0–11.5)

## 2018-04-10 ENCOUNTER — Telehealth: Payer: Self-pay | Admitting: *Deleted

## 2018-04-10 NOTE — Telephone Encounter (Signed)
Patient would like a call back re: his tumor marker has become elevated.

## 2018-04-17 ENCOUNTER — Ambulatory Visit (HOSPITAL_COMMUNITY)
Admission: RE | Admit: 2018-04-17 | Discharge: 2018-04-17 | Disposition: A | Discharge status: Home / Self Care | Payer: Medicaid Other | Source: Ambulatory Visit | Attending: Interventional Radiology | Admitting: Interventional Radiology

## 2018-04-17 DIAGNOSIS — C220 Liver cell carcinoma: Secondary | ICD-10-CM | POA: Diagnosis present

## 2018-04-17 DIAGNOSIS — K8020 Calculus of gallbladder without cholecystitis without obstruction: Secondary | ICD-10-CM | POA: Insufficient documentation

## 2018-04-17 DIAGNOSIS — Z9889 Other specified postprocedural states: Secondary | ICD-10-CM | POA: Diagnosis not present

## 2018-04-17 DIAGNOSIS — K7460 Unspecified cirrhosis of liver: Secondary | ICD-10-CM | POA: Insufficient documentation

## 2018-04-17 DIAGNOSIS — I700 Atherosclerosis of aorta: Secondary | ICD-10-CM | POA: Diagnosis not present

## 2018-04-17 MED ORDER — GADOXETATE DISODIUM 0.25 MMOL/ML IV SOLN
10.0000 mL | Freq: Once | INTRAVENOUS | Status: AC | PRN
  Administered 2018-04-17: 7 mL via INTRAVENOUS

## 2018-04-19 ENCOUNTER — Other Ambulatory Visit: Payer: Self-pay | Admitting: Family Medicine

## 2018-04-19 DIAGNOSIS — L299 Pruritus, unspecified: Secondary | ICD-10-CM

## 2018-04-23 ENCOUNTER — Telehealth: Payer: Self-pay | Admitting: Radiology

## 2018-04-23 NOTE — Telephone Encounter (Signed)
Called patient to review MR results of 04/17/2018 per instructions of Dr Jacqulynn Cadet.    Will repeat labs, MR Abd w/ & w/o contrast early August 2019.  Patient understands.  Zhana Jeangilles Riki Rusk, RN 04/23/2018 8:53 AM

## 2018-04-28 ENCOUNTER — Other Ambulatory Visit: Payer: Self-pay | Admitting: Family Medicine

## 2018-04-28 DIAGNOSIS — G629 Polyneuropathy, unspecified: Secondary | ICD-10-CM

## 2018-05-05 ENCOUNTER — Other Ambulatory Visit: Payer: Self-pay

## 2018-05-12 ENCOUNTER — Telehealth: Payer: Self-pay

## 2018-05-12 DIAGNOSIS — L299 Pruritus, unspecified: Secondary | ICD-10-CM

## 2018-05-12 MED ORDER — HYDROXYZINE HCL 10 MG PO TABS: ORAL_TABLET | ORAL | 0 refills | Status: DC

## 2018-05-12 NOTE — Telephone Encounter (Signed)
Medication sent.

## 2018-05-29 ENCOUNTER — Telehealth: Payer: Self-pay

## 2018-05-29 DIAGNOSIS — G629 Polyneuropathy, unspecified: Secondary | ICD-10-CM

## 2018-05-29 MED ORDER — GABAPENTIN 100 MG PO CAPS: ORAL_CAPSULE | ORAL | 0 refills | Status: DC

## 2018-05-29 NOTE — Telephone Encounter (Signed)
Medication sent.

## 2018-06-01 ENCOUNTER — Telehealth: Payer: Self-pay

## 2018-06-01 DIAGNOSIS — L299 Pruritus, unspecified: Secondary | ICD-10-CM

## 2018-06-01 MED ORDER — HYDROXYZINE HCL 10 MG PO TABS: ORAL_TABLET | ORAL | 0 refills | Status: DC

## 2018-06-01 NOTE — Telephone Encounter (Signed)
Medication sent to pharmacy  

## 2018-06-10 ENCOUNTER — Encounter: Payer: Self-pay | Admitting: Vascular Surgery

## 2018-06-10 ENCOUNTER — Encounter (HOSPITAL_COMMUNITY): Payer: Self-pay

## 2018-06-14 ENCOUNTER — Other Ambulatory Visit: Payer: Self-pay | Admitting: Family Medicine

## 2018-06-16 ENCOUNTER — Ambulatory Visit: Payer: Self-pay | Admitting: Family Medicine

## 2018-06-17 ENCOUNTER — Encounter: Payer: Self-pay | Admitting: Family Medicine

## 2018-06-17 ENCOUNTER — Ambulatory Visit (INDEPENDENT_AMBULATORY_CARE_PROVIDER_SITE_OTHER): Payer: Medicaid Other | Admitting: Family Medicine

## 2018-06-17 VITALS — BP 128/60 | HR 60 | Temp 98.1°F | Ht 68.5 in | Wt 180.0 lb

## 2018-06-17 DIAGNOSIS — I10 Essential (primary) hypertension: Secondary | ICD-10-CM

## 2018-06-17 DIAGNOSIS — Z131 Encounter for screening for diabetes mellitus: Secondary | ICD-10-CM | POA: Diagnosis not present

## 2018-06-17 DIAGNOSIS — R21 Rash and other nonspecific skin eruption: Secondary | ICD-10-CM

## 2018-06-17 DIAGNOSIS — Z09 Encounter for follow-up examination after completed treatment for conditions other than malignant neoplasm: Secondary | ICD-10-CM

## 2018-06-17 LAB — POCT URINALYSIS DIP (MANUAL ENTRY)
Bilirubin, UA: NEGATIVE
Blood, UA: NEGATIVE
Glucose, UA: NEGATIVE mg/dL
Ketones, POC UA: NEGATIVE mg/dL
Leukocytes, UA: NEGATIVE
Nitrite, UA: NEGATIVE
Protein Ur, POC: NEGATIVE mg/dL
Spec Grav, UA: 1.01 (ref 1.010–1.025)
Urobilinogen, UA: 1 E.U./dL
pH, UA: 6.5 (ref 5.0–8.0)

## 2018-06-17 LAB — POCT GLYCOSYLATED HEMOGLOBIN (HGB A1C): Hemoglobin A1C: 5.5 % (ref 4.0–5.6)

## 2018-06-17 NOTE — Patient Instructions (Signed)
Hydrocortisone skin cream, ointment, lotion, or solution What is this medicine? HYDROCORTISONE (hye droe KOR ti sone) is a corticosteroid. It is used on the skin to reduce swelling, redness, itching, and allergic reactions. This medicine may be used for other purposes; ask your health care provider or pharmacist if you have questions. COMMON BRAND NAME(S): Ala-Cort, Ala-Scalp, Anusol HC, Aqua Glycolic HC, Balneol for Her, Caldecort, Cetacort, Cortaid, Cortaid Advanced, Cortaid Intensive Therapy, Cortaid Sensitive Skin, CortAlo, Corticaine, Corticool, Cortizone, Cortizone-10, Cortizone-10 Cooling Relief, Cortizone-10 Intensive Healing, Cortizone-10 Plus, Dermarest Dricort, Dermarest Eczema, DERMASORB HC Complete, Gly-Cort, Hycort, Hydro Skin, Hydroskin, Hytone, Instacort, Lacticare HC, Locoid, Locoid Lipocream, MiCort-HC, Monistat Complete Care Instant Itch Relief Cream, Neosporin Eczema, NuCort, Nutracort, NuZon, Pandel, Pediaderm HC, Penecort, Preparation H Hydrocortisone, Procto-Kit, Procto-Med HC, Proctocream-HC, Proctosol-HC, Proctozone-HC, Rederm, Sarnol-HC, Nurse, adult, Engineer, site, Contractor, Tucks HC, Human resources officer What should I tell my health care provider before I take this medicine? They need to know if you have any of these conditions: -any active infection -diabetes -large areas of burned or damaged skin -skin wasting or thinning -an unusual or allergic reaction to hydrocortisone, corticosteroids, sulfites, other medicines, foods, dyes, or preservatives -pregnant or trying to get pregnant -breast-feeding How should I use this medicine? This medicine is for external use only. Do not take by mouth. Follow the directions on the prescription label. Wash your hands before and after use. Apply a thin film of medicine to the affected area. Do not cover with a bandage or dressing unless your doctor or health care professional tells you to. Do not use on healthy skin or over large areas of skin.  Do not get this medicine in your eyes. If you do, rinse out with plenty of cool tap water. Do not to use more medicine than prescribed. Do not use your medicine more often than directed or for more than 14 days. Talk to your pediatrician regarding the use of this medicine in children. Special care may be needed. While this drug may be prescribed for children as young as 55 years of age for selected conditions, precautions do apply. Do not use this medicine for the treatment of diaper rash unless directed to do so by your doctor or health care professional. If applying this medicine to the diaper area of a child, do not cover with tight-fitting diapers or plastic pants. This may increase the amount of medicine that passes through the skin and increase the risk of serious side effects. Elderly patients are more likely to have damaged skin through aging, and this may increase side effects. This medicine should only be used for brief periods and infrequently in older patients. Overdosage: If you think you have taken too much of this medicine contact a poison control center or emergency room at once. NOTE: This medicine is only for you. Do not share this medicine with others. What if I miss a dose? If you miss a dose, use it as soon as you can. If it is almost time for your next dose, use only that dose. Do not use double or extra doses. What may interact with this medicine? Interactions are not expected. Do not use any other skin products on the affected area without asking your doctor or health care professional. This list may not describe all possible interactions. Give your health care provider a list of all the medicines, herbs, non-prescription drugs, or dietary supplements you use. Also tell them if you smoke, drink alcohol, or use illegal drugs. Some items may interact with your  medicine. What should I watch for while using this medicine? Tell your doctor or health care professional if your symptoms do  not start to get better within 7 days or if they get worse. Tell your doctor or health care professional if you are exposed to anyone with measles or chickenpox, or if you develop sores or blisters that do not heal properly. What side effects may I notice from receiving this medicine? Side effects that you should report to your doctor or health care professional as soon as possible: -allergic reactions like skin rash, itching or hives, swelling of the face, lips, or tongue -burning feeling on the skin -dark red spots on the skin -infection -lack of healing of skin condition -painful, red, pus filled blisters in hair follicles -thinning of the skin Side effects that usually do not require medical attention (report to your doctor or health care professional if they continue or are bothersome): -dry skin, irritation -unusual increased growth of hair on the face or body This list may not describe all possible side effects. Call your doctor for medical advice about side effects. You may report side effects to FDA at 1-800-FDA-1088. Where should I keep my medicine? Keep out of the reach of children. Store at room temperature between 15 and 30 degrees C (59 and 86 degrees F). Do not freeze. Throw away any unused medicine after the expiration date. NOTE: This sheet is a summary. It may not cover all possible information. If you have questions about this medicine, talk to your doctor, pharmacist, or health care provider.  2018 Elsevier/Gold Standard (2008-04-15 16:08:48)  

## 2018-06-17 NOTE — Progress Notes (Signed)
Subjective:    Patient ID: Jeffrey Snow, male    DOB: 28-Oct-1956, 62 y.o.   MRN: [id number]   PCP: Kathe Becton, NP  Chief Complaint  Patient presents with  . Follow-up    6 month on chronic condition     HPI  Jeffrey Snow has a past medical history of Panic Attack, Liver Mass, Hypertension, Hepatitis C, HCC, Depression, Cirrhosis, and Anxiety. He is here today for follow up.   Current Status: Since his last office visit, he is doing well with no complaints.   He denies fevers, chills, fatigue, recent infections, weight loss, and night sweats.   He has not had any headaches, visual changes, dizziness, and falls.   No chest pain, heart palpitations, cough and shortness of breath reported.   No reports of GI problems such as nausea, vomiting, diarrhea, and constipation. She has no reports of blood in stools, dysuria and hematuria.   No depression or anxiety, and denies suicidal ideations, homicidal ideations, or auditory hallucinations.   She denies pain today.   He c/o a upper left leg rash.   Past Medical History:  Diagnosis Date  . Abnormal MRI of abdomen, liver  04/21/2014  . Anxiety   . Cirrhosis, alcoholic ([place]) 03/16/9621  . Claudication of left lower extremity (HCC) m-1  . Depression   . Timpson (hepatocellular carcinoma) ([place])   . Hepatitis C   . Hypertension   . Liver mass   . Panic attack     Family History  Problem Relation Age of Onset  . Diabetes Father   . Diabetes Brother   . Diabetes Brother     Social History   Socioeconomic History  . Marital status: Single    Spouse name: Not on file  . Number of children: 3  . Years of education: Not on file  . Highest education level: Not on file  Occupational History    Employer: [employer]  . Financial resource strain: Not on file  . Food insecurity:    Worry: Not on file    Inability: Not on file  . Transportation needs:    Medical: Not on file    Non-medical: Not on file   Tobacco Use  . Smoking status: Former Smoker    Packs/day: 0.50    Last attempt to quit: 08/21/2011    Years since quitting: 6.8  . Smokeless tobacco: Never Used  Substance and Sexual Activity  . Alcohol use: No    Alcohol/week: 0.0 oz    Comment: Occasional beer on the weekends  . Drug use: No    Types: Cocaine    Comment: abused cocaine in the 80's  . Sexual activity: Never  Lifestyle  . Physical activity:    Days per week: Not on file    Minutes per session: Not on file  . Stress: Not on file  Relationships  . Social connections:    Talks on phone: Not on file    Gets together: Not on file    Attends religious service: Not on file    Active member of club or organization: Not on file    Attends meetings of clubs or organizations: Not on file    Relationship status: Not on file  . Intimate partner violence:    Fear of current or ex partner: Not on file    Emotionally abused: Not on file    Physically abused: Not on file    Forced sexual activity:  Not on file  Other Topics Concern  . Not on file  Social History Narrative   Single, never married.  Lives alone.  3 daughters, 16 grandchildren who are not very involved in the patient's care.  Quit smoking and heavy drinking 3.5 years ago.     Past Surgical History:  Procedure Laterality Date  . ABDOMINAL AORTAGRAM N/A 12/27/2013   Procedure: ABDOMINAL Maxcine Ham;  Surgeon: Conrad Rufus, MD;  Location[facility] CATH LAB;  Service: Cardiovascular;  Laterality: N/A;  . AORTOGRAM    . COSMETIC SURGERY     facial reconstructive surgery  . ESOPHAGOGASTRODUODENOSCOPY N/A 12/17/2014   Procedure: ESOPHAGOGASTRODUODENOSCOPY (EGD);  Surgeon: Missy Sabins, MD;  Location: [facility] ENDOSCOPY;  Service: Endoscopy;  Laterality: N/A;  . ESOPHAGOGASTRODUODENOSCOPY N/A 09/05/2015   Procedure: ESOPHAGOGASTRODUODENOSCOPY (EGD);  Surgeon: Wonda Horner, MD;  Location: [facility] ENDOSCOPY;  Service: Endoscopy;  Laterality: N/A;  . IR GENERIC HISTORICAL  01/19/2015    IR RADIOLOGIST EVAL & MGMT 01/19/2015 Jacqulynn Cadet, MD [facility] INTERV RAD  . IR GENERIC HISTORICAL  07/23/2016   IR RADIOLOGIST EVAL & MGMT 07/23/2016 Jacqulynn Cadet, MD [facility] INTERV RAD  . IR GENERIC HISTORICAL  05/30/2015   IR RADIOLOGIST EVAL & MGMT 05/30/2015 [facility] INTERV RAD  . IR GENERIC HISTORICAL  10/04/2014   IR RADIOLOGIST EVAL & MGMT 10/04/2014 Jacqulynn Cadet, MD [facility] INTERV RAD  . IR GENERIC HISTORICAL  02/11/2017   IR RADIOLOGIST EVAL & MGMT 02/11/2017 Jacqulynn Cadet, MD [facility] INTERV RAD  . IR RADIOLOGIST EVAL & MGMT  08/13/2017  . LOWER EXTREMITY ANGIOGRAM Bilateral 12/27/2013   Procedure: LOWER EXTREMITY ANGIOGRAM;  Surgeon: Conrad Geneva, MD;  Location: [facility] CATH LAB;  Service: Cardiovascular;  Laterality: Bilateral;    Immunization History  Administered Date(s) Administered  . Influenza,inj,Quad PF,6+ Mos 12/20/2013, 12/21/2014, 10/09/2015, 08/22/2017  . Tdap 01/27/2014    Current Meds  Medication Sig  . ALPRAZolam (XANAX) 0.5 MG tablet Take 0.5 mg by mouth 2 (two) times daily.   . bisacodyl (DULCOLAX) 5 MG EC tablet Take 5 mg by mouth daily as needed for mild constipation or moderate constipation. Reported on 04/03/2016  . FLUoxetine (PROZAC) 40 MG capsule Take 1 capsule by mouth every morning.  . furosemide (LASIX) 20 MG tablet TAKE 1 TABLET(20 MG) BY MOUTH DAILY  . gabapentin (NEURONTIN) 100 MG capsule TAKE 1 CAPSULE(100 MG) BY MOUTH THREE TIMES DAILY  . hydrOXYzine (ATARAX/VISTARIL) 10 MG tablet TAKE 1 TABLET(10 MG) BY MOUTH THREE TIMES DAILY AS NEEDED  . pantoprazole (PROTONIX) 40 MG tablet Take 1 tablet (40 mg total) by mouth 2 (two) times daily before a meal.  . spironolactone (ALDACTONE) 25 MG tablet Take 3 tablets (75 mg total) by mouth daily. Resume in 2-3 days.  . vitamin C (ASCORBIC ACID) 250 MG tablet Take 500 mg by mouth daily.  . [DISCONTINUED] gabapentin (NEURONTIN) 100 MG capsule TAKE 1 CAPSULE(100 MG) BY MOUTH THREE TIMES DAILY  . [DISCONTINUED]  hydrOXYzine (ATARAX/VISTARIL) 10 MG tablet TAKE 1 TABLET(10 MG) BY MOUTH THREE TIMES DAILY AS NEEDED  . [DISCONTINUED] pentoxifylline (TRENTAL) 400 MG CR tablet TAKE 1 TABLET(400 MG) BY MOUTH THREE TIMES DAILY WITH MEALS    Allergies  Allergen Reactions  . Folic Acid Other (See Comments)    Makes stomach feel funny    BP 128/60 (BP Location: Left Arm, Patient Position: Sitting, Cuff Size: Large)   Pulse 60   Temp 98.1 F (36.7 C) (Oral)   Ht 5' 8.5" (1.74 m)  Wt 180 lb (81.6 kg)   SpO2 96%   BMI 26.97 kg/m   Review of Systems  Constitutional: Negative.   HENT: Negative.   Eyes: Negative.   Respiratory: Negative.   Cardiovascular: Negative.   Gastrointestinal: Positive for abdominal distention (Obese).  Endocrine: Negative.   Genitourinary: Negative.   Musculoskeletal: Negative.   Allergic/Immunologic: Negative.   Neurological: Negative.   Hematological: Negative.   Psychiatric/Behavioral: Negative.    Objective:   Physical Exam  Constitutional: He is oriented to person, place, and time. He appears well-developed and well-nourished.  HENT:  Head: Normocephalic and atraumatic.  Right Ear: External ear normal.  Left Ear: External ear normal.  Nose: Nose normal.  Mouth/Throat: Oropharynx is clear and moist.  Eyes: Pupils are equal, round, and reactive to light. Conjunctivae and EOM are normal.  Neck: Normal range of motion. Neck supple.  Cardiovascular: Normal rate, regular rhythm, normal heart sounds and intact distal pulses.  Pulmonary/Chest: Effort normal and breath sounds normal.  Abdominal: Soft. Bowel sounds are normal.  Neurological: He is alert and oriented to person, place, and time.  Skin: Skin is warm and dry. Rash (left upper leg) noted.  Psychiatric: He has a normal mood and affect. His behavior is normal. Judgment and thought content normal.  Nursing note and vitals reviewed.  Assessment & Plan:   1. Screening for diabetes mellitus Hgb A1c 5.5 and  Urinalysis is negative today.  - POCT urinalysis dipstick - POCT glycosylated hemoglobin (Hb A1C)  2. Rash and nonspecific skin eruption He will try OTC Hydrocortisone 1% cream for upper left leg skin rash.   3. Essential hypertension Blood pressure is 128/60 today. He will continue taking Furosemide and Spironolactone as prescribed.   4. Follow up He will follow up in 6 months.    No orders of the defined types were placed in this encounter.   Kathe Becton,  MSN, FNP-BC [facility] [address] [place], [place] [address] [phone]

## 2018-06-22 ENCOUNTER — Telehealth: Payer: Self-pay

## 2018-06-22 NOTE — Telephone Encounter (Signed)
Patient states that he has been using the over the counter hydrocortisone cream and need something stronger to get rid of the rash. Also wants to know if you will okay his hydroxyzine to be refilled.

## 2018-06-23 ENCOUNTER — Other Ambulatory Visit: Payer: Self-pay | Admitting: Family Medicine

## 2018-06-23 DIAGNOSIS — L299 Pruritus, unspecified: Secondary | ICD-10-CM

## 2018-06-23 DIAGNOSIS — R21 Rash and other nonspecific skin eruption: Secondary | ICD-10-CM

## 2018-06-23 MED ORDER — TRIAMCINOLONE ACETONIDE 0.1 % EX CREA: 1.0000 "application " | TOPICAL_CREAM | Freq: Two times a day (BID) | CUTANEOUS | 1 refills | Status: DC

## 2018-06-23 MED ORDER — HYDROXYZINE HCL 10 MG PO TABS: ORAL_TABLET | ORAL | 2 refills | Status: DC

## 2018-06-23 NOTE — Telephone Encounter (Signed)
Patient notified

## 2018-06-23 NOTE — Progress Notes (Signed)
Rx for Triamcinolone Cream to pharmacy today.

## 2018-06-23 NOTE — Telephone Encounter (Signed)
-----   Message from Azzie Glatter, [place] sent at 06/23/2018 12:11 PM EDT ----- Regarding: "New Rx" Jeffrey Snow,   Please contact patient to let him the new Rx for:  Triamcinolone Cream 1% was sent to his pharmacy today.    Thank you1

## 2018-06-23 NOTE — Progress Notes (Signed)
Refill for Hydroxyzine to pharmacy today.

## 2018-06-24 ENCOUNTER — Other Ambulatory Visit: Payer: Self-pay

## 2018-06-24 DIAGNOSIS — G629 Polyneuropathy, unspecified: Secondary | ICD-10-CM

## 2018-06-24 MED ORDER — GABAPENTIN 100 MG PO CAPS: ORAL_CAPSULE | ORAL | 0 refills | Status: DC

## 2018-06-24 NOTE — Telephone Encounter (Signed)
Medication sent to pharmacy  

## 2018-07-09 ENCOUNTER — Other Ambulatory Visit: Payer: Self-pay | Admitting: *Deleted

## 2018-07-09 DIAGNOSIS — C220 Liver cell carcinoma: Secondary | ICD-10-CM

## 2018-07-28 LAB — CBC
HCT: 44.7 % (ref 38.5–50.0)
Hemoglobin: 15.3 g/dL (ref 13.2–17.1)
MCH: 29.9 pg (ref 27.0–33.0)
MCHC: 34.2 g/dL (ref 32.0–36.0)
MCV: 87.5 fL (ref 80.0–100.0)
MPV: 12.2 fL (ref 7.5–12.5)
Platelets: 61 10*3/uL — ABNORMAL LOW (ref 140–400)
RBC: 5.11 10*6/uL (ref 4.20–5.80)
RDW: 14.1 % (ref 11.0–15.0)
WBC: 4.2 10*3/uL (ref 3.8–10.8)

## 2018-07-28 LAB — COMPREHENSIVE METABOLIC PANEL
AG Ratio: 1.7 (calc) (ref 1.0–2.5)
ALT: 14 U/L (ref 9–46)
AST: 13 U/L (ref 10–35)
Albumin: 4.5 g/dL (ref 3.6–5.1)
Alkaline phosphatase (APISO): 105 U/L (ref 40–115)
BUN: 10 mg/dL (ref 7–25)
CO2: 29 mmol/L (ref 20–32)
Calcium: 9.2 mg/dL (ref 8.6–10.3)
Chloride: 105 mmol/L (ref 98–110)
Creat: 0.94 mg/dL (ref 0.70–1.25)
Globulin: 2.6 g/dL (calc) (ref 1.9–3.7)
Glucose, Bld: 72 mg/dL (ref 65–99)
Potassium: 3.9 mmol/L (ref 3.5–5.3)
Sodium: 143 mmol/L (ref 135–146)
Total Bilirubin: 1 mg/dL (ref 0.2–1.2)
Total Protein: 7.1 g/dL (ref 6.1–8.1)

## 2018-07-28 LAB — PROTIME-INR
INR: 1.1
Prothrombin Time: 11.9 s — ABNORMAL HIGH (ref 9.0–11.5)

## 2018-07-28 LAB — AFP TUMOR MARKER: AFP-Tumor Marker: 133.7 ng/mL — ABNORMAL HIGH (ref <6.1)

## 2018-07-29 ENCOUNTER — Telehealth: Payer: Self-pay

## 2018-07-29 DIAGNOSIS — G629 Polyneuropathy, unspecified: Secondary | ICD-10-CM

## 2018-07-29 MED ORDER — GABAPENTIN 100 MG PO CAPS: ORAL_CAPSULE | ORAL | 0 refills | Status: DC

## 2018-07-29 NOTE — Telephone Encounter (Signed)
Medication sent to pharmacy. Patient notified.

## 2018-08-05 ENCOUNTER — Other Ambulatory Visit (HOSPITAL_COMMUNITY): Payer: Self-pay | Admitting: Interventional Radiology

## 2018-08-05 DIAGNOSIS — C220 Liver cell carcinoma: Secondary | ICD-10-CM

## 2018-08-11 ENCOUNTER — Ambulatory Visit (HOSPITAL_COMMUNITY)
Admission: RE | Admit: 2018-08-11 | Discharge: 2018-08-11 | Disposition: A | Discharge status: Home / Self Care | Payer: Medicaid Other | Source: Ambulatory Visit | Attending: Interventional Radiology | Admitting: Interventional Radiology

## 2018-08-11 ENCOUNTER — Ambulatory Visit
Admission: RE | Admit: 2018-08-11 | Discharge: 2018-08-11 | Disposition: A | Discharge status: Home / Self Care | Payer: Medicaid Other | Source: Ambulatory Visit | Attending: Interventional Radiology | Admitting: Interventional Radiology

## 2018-08-11 DIAGNOSIS — C220 Liver cell carcinoma: Secondary | ICD-10-CM

## 2018-08-11 DIAGNOSIS — K7460 Unspecified cirrhosis of liver: Secondary | ICD-10-CM | POA: Insufficient documentation

## 2018-08-11 DIAGNOSIS — K8020 Calculus of gallbladder without cholecystitis without obstruction: Secondary | ICD-10-CM | POA: Diagnosis not present

## 2018-08-11 DIAGNOSIS — R161 Splenomegaly, not elsewhere classified: Secondary | ICD-10-CM | POA: Diagnosis not present

## 2018-08-11 HISTORY — PX: IR RADIOLOGIST EVAL & MGMT: IMG5224

## 2018-08-11 MED ORDER — GADOXETATE DISODIUM 0.25 MMOL/ML IV SOLN
10.0000 mL | Freq: Once | INTRAVENOUS | Status: AC | PRN
  Administered 2018-08-11: 7 mL via INTRAVENOUS

## 2018-08-11 NOTE — Progress Notes (Signed)
Referring Physician(s): Magrinat,G/Hayes,J/[name],D  Chief Complaint: The patient is seen in follow up today s/p DEB- TACE on 06/09/14 followed by thermal ablation on 06/10/14 of right hepatic lobe [place]  History of present illness: Jeffrey Snow is a 62 year old male with history of alcoholic cirrhosis and hepatitis C as well as hepatocellular carcinoma.  He is status post right hepatic lobe [place] DEB-TACE followed by thermal ablation in 2015.  He presents today to discuss follow-up MRI abdomen.  According to the patient he has been doing very well.  He currently denies fever, headache, chest pain, dyspnea, cough, abdominal/back pain, nausea, vomiting or bleeding.  His appetite is good.  His functional status is good.  Latest AFP from 07/27/2018 shows slight increase at 133.7, previously 115.2 in May of this year.   Past Medical History:  Diagnosis Date  . Abnormal MRI of abdomen, liver  04/21/2014  . Anxiety   . Cirrhosis, alcoholic ([place]) 07/17/9936  . Claudication of left lower extremity (HCC) m-1  . Depression   . [place] (hepatocellular carcinoma) ([place])   . Hepatitis C   . Hypertension   . Liver mass   . Panic attack     Past Surgical History:  Procedure Laterality Date  . ABDOMINAL AORTAGRAM N/A 12/27/2013   Procedure: ABDOMINAL Maxcine Ham;  Surgeon: Conrad Newberry, MD;  Location: [facility] CATH LAB;  Service: Cardiovascular;  Laterality: N/A;  . AORTOGRAM    . COSMETIC SURGERY     facial reconstructive surgery  . ESOPHAGOGASTRODUODENOSCOPY N/A 12/17/2014   Procedure: ESOPHAGOGASTRODUODENOSCOPY (EGD);  Surgeon: Missy Sabins, MD;  Location: [facility] ENDOSCOPY;  Service: Endoscopy;  Laterality: N/A;  . ESOPHAGOGASTRODUODENOSCOPY N/A 09/05/2015   Procedure: ESOPHAGOGASTRODUODENOSCOPY (EGD);  Surgeon: Wonda Horner, MD;  Location: [facility] ENDOSCOPY;  Service: Endoscopy;  Laterality: N/A;  . IR GENERIC HISTORICAL  01/19/2015   IR RADIOLOGIST EVAL & MGMT 01/19/2015 Jacqulynn Cadet, MD [facility] INTERV RAD  . IR GENERIC  HISTORICAL  07/23/2016   IR RADIOLOGIST EVAL & MGMT 07/23/2016 Jacqulynn Cadet, MD [facility] INTERV RAD  . IR GENERIC HISTORICAL  05/30/2015   IR RADIOLOGIST EVAL & MGMT 05/30/2015 [facility] INTERV RAD  . IR GENERIC HISTORICAL  10/04/2014   IR RADIOLOGIST EVAL & MGMT 10/04/2014 Jacqulynn Cadet, MD [facility] INTERV RAD  . IR GENERIC HISTORICAL  02/11/2017   IR RADIOLOGIST EVAL & MGMT 02/11/2017 Jacqulynn Cadet, MD [facility] INTERV RAD  . IR RADIOLOGIST EVAL & MGMT  08/13/2017  . LOWER EXTREMITY ANGIOGRAM Bilateral 12/27/2013   Procedure: LOWER EXTREMITY ANGIOGRAM;  Surgeon: Conrad Oxford, MD;  Location: [facility] CATH LAB;  Service: Cardiovascular;  Laterality: Bilateral;    Allergies: Folic acid and Oxycodone  Medications: Prior to Admission medications   Medication Sig Start Date End Date Taking? Authorizing Provider  ALPRAZolam Duanne Moron) 0.5 MG tablet Take 0.5 mg by mouth 2 (two) times daily.    Yes [provider]  bisacodyl (DULCOLAX) 5 MG EC tablet Take 5 mg by mouth daily as needed for mild constipation or moderate constipation. Reported on 04/03/2016   Yes [provider]  furosemide (LASIX) 20 MG tablet TAKE 1 TABLET(20 MG) BY MOUTH DAILY 06/15/18  Yes Jegede, Olugbemiga E, MD  gabapentin (NEURONTIN) 100 MG capsule TAKE 1 CAPSULE(100 MG) BY MOUTH THREE TIMES DAILY 07/29/18  Yes Azzie Glatter, FNP  hydrOXYzine (ATARAX/VISTARIL) 10 MG tablet TAKE 1 TABLET(10 MG) BY MOUTH THREE TIMES DAILY AS NEEDED 06/23/18  Yes Azzie Glatter, FNP  pantoprazole (PROTONIX) 40 MG tablet Take 1  tablet (40 mg total) by mouth 2 (two) times daily before a meal. 12/17/17  Yes Scot Jun, FNP  spironolactone (ALDACTONE) 25 MG tablet Take 3 tablets (75 mg total) by mouth daily. Resume in 2-3 days. 03/04/18  Yes Scot Jun, FNP  vitamin C (ASCORBIC ACID) 250 MG tablet Take 500 mg by mouth daily.   Yes [provider]  FEROSUL 325 (65 Fe) MG tablet TAKE 1 TABLET(325 MG) BY MOUTH DAILY WITH  BREAKFAST Patient not taking: Reported on 06/17/2018 11/10/17   Scot Jun, FNP  FLUoxetine (PROZAC) 40 MG capsule Take 1 capsule by mouth every morning. 05/02/15   [provider]  triamcinolone cream (KENALOG) 0.1 % Apply 1 application topically 2 (two) times daily. Patient not taking: Reported on 08/11/2018 06/23/18   Azzie Glatter, FNP     Family History  Problem Relation Age of Onset  . Diabetes Father   . Diabetes Brother   . Diabetes Brother     Social History   Socioeconomic History  . Marital status: Single    Spouse name: Not on file  . Number of children: 3  . Years of education: Not on file  . Highest education level: Not on file  Occupational History    Employer: [employer]  . Financial resource strain: Not on file  . Food insecurity:    Worry: Not on file    Inability: Not on file  . Transportation needs:    Medical: Not on file    Non-medical: Not on file  Tobacco Use  . Smoking status: Former Smoker    Packs/day: 0.50    Last attempt to quit: 08/21/2011    Years since quitting: 6.9  . Smokeless tobacco: Never Used  Substance and Sexual Activity  . Alcohol use: No    Alcohol/week: 0.0 standard drinks    Comment: Occasional beer on the weekends  . Drug use: No    Types: Cocaine    Comment: abused cocaine in the 80's  . Sexual activity: Never  Lifestyle  . Physical activity:    Days per week: Not on file    Minutes per session: Not on file  . Stress: Not on file  Relationships  . Social connections:    Talks on phone: Not on file    Gets together: Not on file    Attends religious service: Not on file    Active member of club or organization: Not on file    Attends meetings of clubs or organizations: Not on file    Relationship status: Not on file  Other Topics Concern  . Not on file  Social History Narrative   Single, never married.  Lives alone.  3 daughters, 16 grandchildren who are not very involved in the  patient's care.  Quit smoking and heavy drinking 3.5 years ago.      Vital Signs: BP (!) 159/70 (BP Location: Right Arm, Patient Position: Sitting, Cuff Size: Normal)   Pulse (!) 52   Temp 97.6 F (36.4 C)   Resp 14   SpO2 95%   Physical Exam awake, alert.  Chest clear to auscultation bilaterally.  Heart with bradycardic but regular rhythm.  Abdomen soft, positive bowel sounds, nontender, splenomegaly.  Mild bilateral lower extremity edema.  Imaging: Mr Abdomen Wwo Contrast  Result Date: 08/11/2018 CLINICAL DATA:  Cirrhosis and HCC status post DEB-TACE 06/09/2014 and microwave ablation 06/10/2014. Restaging. EXAM: MRI ABDOMEN WITHOUT AND WITH CONTRAST TECHNIQUE: Multiplanar  multisequence MR imaging of the abdomen was performed both before and after the administration of intravenous contrast. CONTRAST:  58mL EOVIST GADOXETATE DISODIUM 0.25 MOL/L IV SOLN COMPARISON:  04/17/2018 MRI abdomen. FINDINGS: Lower chest: No acute abnormality at the lung bases. Hepatobiliary: Relative hypertrophy of the lateral segment left liver lobe with markedly irregular liver surface, compatible with hepatic cirrhosis. No hepatic steatosis. No new liver masses. Previous liver masses as follows: -segment 7 right liver dome 1.3 cm mass (series 502/image 21) with arterial hyperenhancement, no portal venous washout or capsule, and with hepatobiliary phase hypoenhancement, previously 1.1 cm using similar measurement technique, slightly increased, considered LI-RADS 3 (intermediate probability for HCC) -stable 2.3 cm ablation defect in the segment 7 right liver lobe (series 502/image 26) without convincing enhancement to suggest recurrence Cholelithiasis. No gallbladder distention. No gallbladder wall thickening. No pericholecystic fluid. No biliary ductal dilatation. Common bile duct diameter 2 mm. No choledocholithiasis. Pancreas: No pancreatic mass or duct dilation.  No pancreas divisum. Spleen: Moderate splenomegaly  (craniocaudal splenic length 16.8 cm), stable. No splenic mass. Adrenals/Urinary Tract: Normal adrenals. No hydronephrosis. Normal kidneys with no renal mass. Stomach/Bowel: Normal non-distended stomach. Visualized small and large bowel is normal caliber, with no bowel wall thickening. Vascular/Lymphatic: Atherosclerotic nonaneurysmal abdominal aorta. Patent portal, splenic, hepatic and renal veins. Stable tiny paraumbilical varix. No pathologically enlarged lymph nodes in the abdomen. Other: No abdominal ascites or focal fluid collection. Musculoskeletal: No aggressive appearing focal osseous lesions. IMPRESSION: 1. Cirrhosis. No evidence of tumor recurrence at the stable ablation defect in the segment 7 right liver lobe. Separate LI-RADS 3 (intermediate probability for HCC) 1.3 cm segment 7 right liver dome mass, minimally increased. No new liver masses. Continued MRI surveillance suggested in 3-6 months. 2. Stable moderate splenomegaly and tiny paraumbilical varix. No ascites. 3. Cholelithiasis.  No biliary ductal dilatation. Electronically Signed   By: Ilona Sorrel M.D.   On: 08/11/2018 14:02    Labs:  CBC: Recent Labs    12/17/17 1130 04/06/18 1424 07/27/18 1103  WBC 4.5 4.8 4.2  HGB 14.9 15.9 15.3  HCT 42.8 46.7 44.7  PLT 51* 58* 61*    COAGS: Recent Labs    04/06/18 1424 07/27/18 1103  INR 1.1 1.1    BMP: Recent Labs    12/17/17 1130 04/06/18 1424 07/27/18 1103  NA 140 139 143  K 4.2 3.7 3.9  CL 101 101 105  CO2 25 30 29   GLUCOSE 81 102* 72  BUN 9 10 10   CALCIUM 9.4 9.3 9.2  CREATININE 0.96 1.02 0.94  GFRNONAA 84 78  --   GFRAA 98 90  --     LIVER FUNCTION TESTS: Recent Labs    12/17/17 1130 04/06/18 1424 07/27/18 1103  BILITOT 0.6 1.2 1.0  AST 17 16 13   ALT 15 18 14   ALKPHOS 123*  --   --   PROT 7.4 7.4 7.1  ALBUMIN 4.8  --   --     Assessment:  62 year old male with history of alcoholic cirrhosis and hepatitis C as well as hepatocellular carcinoma.   He is status post right hepatic lobe [place] DEB-TACE followed by thermal ablation in 2015. Latest AFP from 07/27/2018 shows slight increase at 133.7, previously 115.2 in May of this year.  Patient currently without acute complaints.  Follow-up MRI abdomen today reveals known cirrhosis with no evidence of tumor recurrence at the stable ablation defect in segment 7 of the right hepatic lobe.  There is a separate 1.3 cm  segment 7 right liver dome mass which is slightly increased in size.  No new liver masses.  Also noted was stable moderate splenomegaly and a tiny periumbilical varix.  No ascites.  Cholelithiasis present.  Imaging studies were reviewed with patient by Dr. Laurence Ferrari.  Continued MRI surveillance suggested.   Signed: D. Rowe Robert, PA-C 08/11/2018, 2:53 PM   Please refer to Dr. Katrinka Blazing attestation of this note for management and plan.      Patient ID: Jeffrey Snow, male   DOB: [birth year], 62 y.o.   MRN: [id number]

## 2018-08-26 ENCOUNTER — Other Ambulatory Visit: Payer: Self-pay | Admitting: Family Medicine

## 2018-08-26 DIAGNOSIS — G629 Polyneuropathy, unspecified: Secondary | ICD-10-CM

## 2018-08-27 ENCOUNTER — Other Ambulatory Visit: Payer: Self-pay

## 2018-08-27 DIAGNOSIS — G629 Polyneuropathy, unspecified: Secondary | ICD-10-CM

## 2018-08-27 MED ORDER — GABAPENTIN 100 MG PO CAPS: ORAL_CAPSULE | ORAL | 0 refills | Status: DC

## 2018-08-27 NOTE — Telephone Encounter (Signed)
Medication sent to pharmacy  

## 2018-08-31 ENCOUNTER — Other Ambulatory Visit: Payer: Self-pay | Admitting: Family Medicine

## 2018-09-06 ENCOUNTER — Other Ambulatory Visit: Payer: Self-pay | Admitting: Family Medicine

## 2018-09-06 DIAGNOSIS — L299 Pruritus, unspecified: Secondary | ICD-10-CM

## 2018-09-07 ENCOUNTER — Other Ambulatory Visit: Payer: Self-pay

## 2018-09-07 DIAGNOSIS — L299 Pruritus, unspecified: Secondary | ICD-10-CM

## 2018-09-07 MED ORDER — HYDROXYZINE HCL 10 MG PO TABS: ORAL_TABLET | ORAL | 1 refills | Status: DC

## 2018-09-09 ENCOUNTER — Other Ambulatory Visit: Payer: Self-pay

## 2018-09-09 MED ORDER — FUROSEMIDE 20 MG PO TABS: ORAL_TABLET | ORAL | 0 refills | Status: DC

## 2018-09-09 NOTE — Telephone Encounter (Signed)
Medication sent to pharmacy  

## 2018-09-23 ENCOUNTER — Other Ambulatory Visit: Payer: Self-pay | Admitting: Family Medicine

## 2018-09-23 DIAGNOSIS — G629 Polyneuropathy, unspecified: Secondary | ICD-10-CM

## 2018-09-24 ENCOUNTER — Other Ambulatory Visit: Payer: Self-pay

## 2018-09-24 DIAGNOSIS — G629 Polyneuropathy, unspecified: Secondary | ICD-10-CM

## 2018-09-25 MED ORDER — GABAPENTIN 100 MG PO CAPS: ORAL_CAPSULE | ORAL | 0 refills | Status: DC

## 2018-09-26 ENCOUNTER — Other Ambulatory Visit: Payer: Self-pay | Admitting: Family Medicine

## 2018-09-29 ENCOUNTER — Other Ambulatory Visit: Payer: Self-pay

## 2018-09-29 MED ORDER — SPIRONOLACTONE 25 MG PO TABS: ORAL_TABLET | ORAL | 0 refills | Status: DC

## 2018-09-29 NOTE — Telephone Encounter (Signed)
Medication sent to pharmacy  

## 2018-10-21 ENCOUNTER — Other Ambulatory Visit: Payer: Self-pay | Admitting: Family Medicine

## 2018-10-21 DIAGNOSIS — G629 Polyneuropathy, unspecified: Secondary | ICD-10-CM

## 2018-10-22 ENCOUNTER — Telehealth: Payer: Self-pay

## 2018-10-22 DIAGNOSIS — G629 Polyneuropathy, unspecified: Secondary | ICD-10-CM

## 2018-10-22 MED ORDER — GABAPENTIN 100 MG PO CAPS: ORAL_CAPSULE | ORAL | 0 refills | Status: DC

## 2018-10-22 NOTE — Telephone Encounter (Signed)
Medication sent to pharmacy  

## 2018-10-27 ENCOUNTER — Other Ambulatory Visit: Payer: Self-pay | Admitting: Family Medicine

## 2018-10-27 DIAGNOSIS — L299 Pruritus, unspecified: Secondary | ICD-10-CM

## 2018-10-28 ENCOUNTER — Telehealth: Payer: Self-pay

## 2018-10-28 DIAGNOSIS — L299 Pruritus, unspecified: Secondary | ICD-10-CM

## 2018-10-28 MED ORDER — HYDROXYZINE HCL 10 MG PO TABS: ORAL_TABLET | ORAL | 1 refills | Status: DC

## 2018-10-28 MED ORDER — FUROSEMIDE 20 MG PO TABS: ORAL_TABLET | ORAL | 0 refills | Status: DC

## 2018-10-28 MED ORDER — SPIRONOLACTONE 25 MG PO TABS: 75.0000 mg | ORAL_TABLET | Freq: Every day | ORAL | 0 refills | Status: DC

## 2018-10-28 NOTE — Telephone Encounter (Signed)
Medication sent to pharmacy  

## 2018-10-28 NOTE — Telephone Encounter (Signed)
Medication sent.

## 2018-11-22 ENCOUNTER — Other Ambulatory Visit: Payer: Self-pay | Admitting: Family Medicine

## 2018-11-22 DIAGNOSIS — G629 Polyneuropathy, unspecified: Secondary | ICD-10-CM

## 2018-11-23 ENCOUNTER — Telehealth: Payer: Self-pay

## 2018-11-23 DIAGNOSIS — G629 Polyneuropathy, unspecified: Secondary | ICD-10-CM

## 2018-11-23 MED ORDER — SPIRONOLACTONE 25 MG PO TABS: 75.0000 mg | ORAL_TABLET | Freq: Every day | ORAL | 0 refills | Status: DC

## 2018-11-23 MED ORDER — GABAPENTIN 100 MG PO CAPS: ORAL_CAPSULE | ORAL | 0 refills | Status: DC

## 2018-11-23 MED ORDER — FUROSEMIDE 20 MG PO TABS: ORAL_TABLET | ORAL | 0 refills | Status: DC

## 2018-12-18 ENCOUNTER — Ambulatory Visit: Payer: Self-pay | Admitting: Family Medicine

## 2018-12-21 ENCOUNTER — Other Ambulatory Visit: Payer: Self-pay | Admitting: Family Medicine

## 2018-12-21 ENCOUNTER — Other Ambulatory Visit: Payer: Self-pay

## 2018-12-21 DIAGNOSIS — L299 Pruritus, unspecified: Secondary | ICD-10-CM

## 2018-12-21 DIAGNOSIS — G629 Polyneuropathy, unspecified: Secondary | ICD-10-CM

## 2018-12-21 MED ORDER — GABAPENTIN 100 MG PO CAPS: ORAL_CAPSULE | ORAL | 0 refills | Status: DC

## 2018-12-21 NOTE — Telephone Encounter (Signed)
Medication sent to pharmacy  

## 2018-12-28 ENCOUNTER — Other Ambulatory Visit: Payer: Self-pay

## 2018-12-28 ENCOUNTER — Other Ambulatory Visit: Payer: Self-pay | Admitting: Family Medicine

## 2018-12-28 DIAGNOSIS — L299 Pruritus, unspecified: Secondary | ICD-10-CM

## 2019-01-06 ENCOUNTER — Encounter: Payer: Self-pay | Admitting: Family Medicine

## 2019-01-06 ENCOUNTER — Ambulatory Visit (INDEPENDENT_AMBULATORY_CARE_PROVIDER_SITE_OTHER): Payer: Medicaid Other | Admitting: Family Medicine

## 2019-01-06 VITALS — BP 140/76 | HR 58 | Temp 98.2°F | Ht 68.5 in | Wt 177.4 lb

## 2019-01-06 DIAGNOSIS — R21 Rash and other nonspecific skin eruption: Secondary | ICD-10-CM | POA: Diagnosis not present

## 2019-01-06 DIAGNOSIS — I10 Essential (primary) hypertension: Secondary | ICD-10-CM

## 2019-01-06 DIAGNOSIS — C220 Liver cell carcinoma: Secondary | ICD-10-CM

## 2019-01-06 DIAGNOSIS — Z09 Encounter for follow-up examination after completed treatment for conditions other than malignant neoplasm: Secondary | ICD-10-CM

## 2019-01-06 DIAGNOSIS — F419 Anxiety disorder, unspecified: Secondary | ICD-10-CM

## 2019-01-06 DIAGNOSIS — Z131 Encounter for screening for diabetes mellitus: Secondary | ICD-10-CM | POA: Diagnosis not present

## 2019-01-06 LAB — POCT URINALYSIS DIP (MANUAL ENTRY)
Bilirubin, UA: NEGATIVE
Blood, UA: NEGATIVE
Glucose, UA: NEGATIVE mg/dL
Ketones, POC UA: NEGATIVE mg/dL
Leukocytes, UA: NEGATIVE
Nitrite, UA: NEGATIVE
Protein Ur, POC: NEGATIVE mg/dL
Spec Grav, UA: 1.01 (ref 1.010–1.025)
Urobilinogen, UA: 0.2 E.U./dL
pH, UA: 6 (ref 5.0–8.0)

## 2019-01-06 LAB — POCT GLYCOSYLATED HEMOGLOBIN (HGB A1C): Hemoglobin A1C: 5.3 % (ref 4.0–5.6)

## 2019-01-06 NOTE — Progress Notes (Signed)
Established Patient Office Visit  Subjective:  Patient ID: Jeffrey Snow, male    DOB: 22-Sep-1956  Age: 63 y.o. MRN: [id number]  CC:  Chief Complaint  Patient presents with  . Follow-up    chronic condtion     HPI Jeffrey Snow is a 63 year old male who presents for follow up of chronic diseases.  Past Medical History:  Diagnosis Date  . Abnormal MRI of abdomen, liver  04/21/2014  . Anxiety   . Cirrhosis, alcoholic ([place]) 01/17/253  . Claudication of left lower extremity (HCC) m-1  . Depression   . [place] (hepatocellular carcinoma) ([place])   . Hepatitis C   . Hypertension   . Liver mass   . Panic attack      Current Status: Since his last office visit, he is doing well with no complaints. He denies visual changes, chest pain, cough, shortness of breath, heart palpitations, and falls. He has occasionally headaches and dizziness with position changes. Denies severe headaches, confusion, seizures, double vision, and blurred vision, nausea and vomiting. He continues to see Oncologist for regular follow up, for history of Liver Cancer. He continues to see Psychiatrist, who prescribes Alprazolam and Prozac for anxiety. He denies suicidal ideations, homicidal ideations, or auditory hallucinations.  He denies fevers, chills, fatigue, recent infections, weight loss, and night sweats. He has not had any headaches, visual changes, dizziness, and falls. No chest pain, heart palpitations, cough and shortness of breath reported. No reports of GI problems such as nausea, vomiting, diarrhea, and constipation. He has no reports of blood in stools, dysuria and hematuria. He denies pain today.   Past Surgical History:  Procedure Laterality Date  . ABDOMINAL AORTAGRAM N/A 12/27/2013   Procedure: ABDOMINAL Maxcine Ham;  Surgeon: Conrad [place]os, MD;  Location: [facility] CATH LAB;  Service: Cardiovascular;  Laterality: N/A;  . AORTOGRAM    . COSMETIC SURGERY     facial reconstructive surgery  .  ESOPHAGOGASTRODUODENOSCOPY N/A 12/17/2014   Procedure: ESOPHAGOGASTRODUODENOSCOPY (EGD);  Surgeon: Missy Sabins, MD;  Location: [facility] ENDOSCOPY;  Service: Endoscopy;  Laterality: N/A;  . ESOPHAGOGASTRODUODENOSCOPY N/A 09/05/2015   Procedure: ESOPHAGOGASTRODUODENOSCOPY (EGD);  Surgeon: Wonda Horner, MD;  Location: [facility] ENDOSCOPY;  Service: Endoscopy;  Laterality: N/A;  . IR GENERIC HISTORICAL  01/19/2015   IR RADIOLOGIST EVAL & MGMT 01/19/2015 Jacqulynn Cadet, MD [facility] INTERV RAD  . IR GENERIC HISTORICAL  07/23/2016   IR RADIOLOGIST EVAL & MGMT 07/23/2016 Jacqulynn Cadet, MD [facility] INTERV RAD  . IR GENERIC HISTORICAL  05/30/2015   IR RADIOLOGIST EVAL & MGMT 05/30/2015 [facility] INTERV RAD  . IR GENERIC HISTORICAL  10/04/2014   IR RADIOLOGIST EVAL & MGMT 10/04/2014 Jacqulynn Cadet, MD [facility] INTERV RAD  . IR GENERIC HISTORICAL  02/11/2017   IR RADIOLOGIST EVAL & MGMT 02/11/2017 Jacqulynn Cadet, MD [facility] INTERV RAD  . IR RADIOLOGIST EVAL & MGMT  08/13/2017  . IR RADIOLOGIST EVAL & MGMT  08/11/2018  . LOWER EXTREMITY ANGIOGRAM Bilateral 12/27/2013   Procedure: LOWER EXTREMITY ANGIOGRAM;  Surgeon: Conrad [place], MD;  Location: [facility] CATH LAB;  Service: Cardiovascular;  Laterality: Bilateral;    Family History  Problem Relation Age of Onset  . Diabetes Father   . Diabetes Brother   . Diabetes Brother     Social History   Socioeconomic History  . Marital status: Single    Spouse name: Not on file  . Number of children: 3  . Years of education: Not on file  . Highest  education level: Not on file  Occupational History    Employer: [employer]  . Financial resource strain: Not on file  . Food insecurity:    Worry: Not on file    Inability: Not on file  . Transportation needs:    Medical: Not on file    Non-medical: Not on file  Tobacco Use  . Smoking status: Former Smoker    Packs/day: 0.50    Last attempt to quit: 08/21/2011    Years since quitting: 7.3  . Smokeless tobacco: Never Used   Substance and Sexual Activity  . Alcohol use: No    Alcohol/week: 0.0 standard drinks    Comment: Occasional beer on the weekends  . Drug use: No    Types: Cocaine    Comment: abused cocaine in the 80's  . Sexual activity: Never  Lifestyle  . Physical activity:    Days per week: Not on file    Minutes per session: Not on file  . Stress: Not on file  Relationships  . Social connections:    Talks on phone: Not on file    Gets together: Not on file    Attends religious service: Not on file    Active member of club or organization: Not on file    Attends meetings of clubs or organizations: Not on file    Relationship status: Not on file  . Intimate partner violence:    Fear of current or ex partner: Not on file    Emotionally abused: Not on file    Physically abused: Not on file    Forced sexual activity: Not on file  Other Topics Concern  . Not on file  Social History Narrative   Single, never married.  Lives alone.  3 daughters, 16 grandchildren who are not very involved in the patient's care.  Quit smoking and heavy drinking 3.5 years ago.     Outpatient Medications Prior to Visit  Medication Sig Dispense Refill  . ALPRAZolam (XANAX) 0.5 MG tablet Take 0.5 mg by mouth 2 (two) times daily.     . bisacodyl (DULCOLAX) 5 MG EC tablet Take 5 mg by mouth daily as needed for mild constipation or moderate constipation. Reported on 04/03/2016    . FLUoxetine (PROZAC) 40 MG capsule Take 1 capsule by mouth every morning.  2  . furosemide (LASIX) 20 MG tablet TAKE 1 TABLET(20 MG) BY MOUTH DAILY 90 tablet 0  . gabapentin (NEURONTIN) 100 MG capsule TAKE 1 CAPSULE(100 MG) BY MOUTH THREE TIMES DAILY 90 capsule 0  . hydrOXYzine (ATARAX/VISTARIL) 10 MG tablet TAKE 1 TABLET(10 MG) BY MOUTH THREE TIMES DAILY AS NEEDED 90 tablet 1  . pantoprazole (PROTONIX) 40 MG tablet Take 1 tablet (40 mg total) by mouth 2 (two) times daily before a meal. 180 tablet 1  . spironolactone (ALDACTONE) 25 MG tablet  TAKE 3 TABLETS BY MOUTH DAILY. RESUME IN 2-3 DAYS 90 tablet 0  . vitamin C (ASCORBIC ACID) 250 MG tablet Take 500 mg by mouth daily.    . FEROSUL 325 (65 Fe) MG tablet TAKE 1 TABLET(325 MG) BY MOUTH DAILY WITH BREAKFAST (Patient not taking: Reported on 06/17/2018) 30 tablet 0  . spironolactone (ALDACTONE) 25 MG tablet Take 3 tablets (75 mg total) by mouth daily. Resume in 2-3 days. 90 tablet 0  . triamcinolone cream (KENALOG) 0.1 % Apply 1 application topically 2 (two) times daily. (Patient not taking: Reported on 08/11/2018) 30 g 1   No facility-administered  medications prior to visit.     Allergies  Allergen Reactions  . Folic Acid Other (See Comments)    Makes stomach feel funny  . Oxycodone Itching and Other (See Comments)    Cause insomnia    ROS Review of Systems  Constitutional: Negative.   HENT: Negative.   Eyes: Negative.   Respiratory: Negative.   Cardiovascular: Negative.   Gastrointestinal: Positive for abdominal distention.  Endocrine: Negative.   Genitourinary: Negative.   Musculoskeletal: Negative.   Skin: Negative.   Allergic/Immunologic: Negative.   Neurological: Negative.   Hematological: Negative.   Psychiatric/Behavioral: Negative.    Objective:    Physical Exam  Constitutional: He is oriented to person, place, and time. He appears well-developed and well-nourished.  HENT:  Head: Normocephalic and atraumatic.  Eyes: Conjunctivae are normal.  Neck: Normal range of motion. Neck supple.  Cardiovascular: Normal rate, regular rhythm, normal heart sounds and intact distal pulses.  Pulmonary/Chest: Breath sounds normal.  Abdominal: Soft. Bowel sounds are normal.  Musculoskeletal: Normal range of motion.  Neurological: He is alert and oriented to person, place, and time.  Skin: Skin is warm and dry.  Psychiatric: He has a normal mood and affect. His behavior is normal. Judgment and thought content normal.  Nursing note and vitals reviewed.   BP 140/76 (BP  Location: Left Arm, Patient Position: Sitting, Cuff Size: Small)   Pulse (!) 58   Temp 98.2 F (36.8 C) (Oral)   Ht 5' 8.5" (1.74 m)   Wt 177 lb 6.4 oz (80.5 kg)   SpO2 96%   BMI 26.58 kg/m  Wt Readings from Last 3 Encounters:  01/06/19 177 lb 6.4 oz (80.5 kg)  06/17/18 180 lb (81.6 kg)  12/17/17 176 lb (79.8 kg)     Health Maintenance Due  Topic Date Due  . COLONOSCOPY  02/20/2005  . INFLUENZA VACCINE  07/16/2018    There are no preventive care reminders to display for this patient.  No results found for: TSH Lab Results  Component Value Date   WBC 4.2 07/27/2018   HGB 15.3 07/27/2018   HCT 44.7 07/27/2018   MCV 87.5 07/27/2018   PLT 61 (L) 07/27/2018   Lab Results  Component Value Date   NA 143 07/27/2018   K 3.9 07/27/2018   CO2 29 07/27/2018   GLUCOSE 72 07/27/2018   BUN 10 07/27/2018   CREATININE 0.94 07/27/2018   BILITOT 1.0 07/27/2018   ALKPHOS 123 (H) 12/17/2017   AST 13 07/27/2018   ALT 14 07/27/2018   PROT 7.1 07/27/2018   ALBUMIN 4.8 12/17/2017   CALCIUM 9.2 07/27/2018   ANIONGAP 6 09/08/2015   Lab Results  Component Value Date   CHOL 113 01/26/2015   Lab Results  Component Value Date   HDL 35 (L) 01/26/2015   Lab Results  Component Value Date   LDLCALC 69 01/26/2015   Lab Results  Component Value Date   TRIG 46 01/26/2015   Lab Results  Component Value Date   CHOLHDL 3.2 01/26/2015   Lab Results  Component Value Date   HGBA1C 5.3 01/06/2019   Assessment & Plan:   1. Essential hypertension Blood pressure is stable. She will continue to decrease high sodium intake, excessive alcohol intake, increase potassium intake, smoking cessation, and increase physical activity of at least 30 minutes of cardio activity daily. She will continue to follow Heart Healthy or DASH diet.  2. [place] (hepatocellular carcinoma) ([place]) Continue to follow up with Oncologist as needed.  3. Rash and nonspecific skin eruption  4. Anxiety He will  continue Alprazolam and Prozac as prescribed by Psychiatrist. Monitor.   5. Screening for diabetes mellitus Hgb A1c is stable at 5.3 today. He will continue to decrease foods/beverages high in sugars and carbs and follow Heart Healthy or DASH diet. Increase physical activity to at least 30 minutes cardio exercise daily.  - POCT glycosylated hemoglobin (Hb A1C) - POCT urinalysis dipstick  6. Follow up He will follow up in 6 months.   No orders of the defined types were placed in this encounter.  Kathe Becton,  MSN, FNP-C Patient [facility] [place], [place] [address] [phone]   Problem List Items Addressed This Visit      Cardiovascular and Mediastinum   HTN (hypertension) - Primary     Digestive   HCC (hepatocellular carcinoma) ([place])     Musculoskeletal and Integument   Rash and nonspecific skin eruption    Other Visit Diagnoses    Anxiety       Screening for diabetes mellitus       Relevant Orders   POCT glycosylated hemoglobin (Hb A1C) (Completed)   POCT urinalysis dipstick (Completed)   Follow up          No orders of the defined types were placed in this encounter.   Follow-up: Return in about 6 months (around 07/07/2019).    Azzie Glatter, FNP

## 2019-01-09 ENCOUNTER — Other Ambulatory Visit: Payer: Self-pay | Admitting: Family Medicine

## 2019-01-11 ENCOUNTER — Other Ambulatory Visit: Payer: Self-pay | Admitting: Family Medicine

## 2019-01-14 ENCOUNTER — Other Ambulatory Visit: Payer: Self-pay | Admitting: Radiology

## 2019-01-14 ENCOUNTER — Other Ambulatory Visit: Payer: Self-pay | Admitting: Interventional Radiology

## 2019-01-14 DIAGNOSIS — C220 Liver cell carcinoma: Secondary | ICD-10-CM

## 2019-01-17 ENCOUNTER — Other Ambulatory Visit: Payer: Self-pay | Admitting: Family Medicine

## 2019-01-17 DIAGNOSIS — G629 Polyneuropathy, unspecified: Secondary | ICD-10-CM

## 2019-01-19 ENCOUNTER — Telehealth: Payer: Self-pay

## 2019-01-19 DIAGNOSIS — G629 Polyneuropathy, unspecified: Secondary | ICD-10-CM

## 2019-01-19 NOTE — Telephone Encounter (Signed)
Left a vm for patient to callback 

## 2019-01-20 MED ORDER — GABAPENTIN 100 MG PO CAPS: ORAL_CAPSULE | ORAL | 1 refills | Status: DC

## 2019-02-02 LAB — CBC
HCT: 45.1 % (ref 38.5–50.0)
Hemoglobin: 14.8 g/dL (ref 13.2–17.1)
MCH: 29 pg (ref 27.0–33.0)
MCHC: 32.8 g/dL (ref 32.0–36.0)
MCV: 88.3 fL (ref 80.0–100.0)
MPV: 11.6 fL (ref 7.5–12.5)
Platelets: 61 10*3/uL — ABNORMAL LOW (ref 140–400)
RBC: 5.11 10*6/uL (ref 4.20–5.80)
RDW: 15.1 % — ABNORMAL HIGH (ref 11.0–15.0)
WBC: 3.4 10*3/uL — ABNORMAL LOW (ref 3.8–10.8)

## 2019-02-02 LAB — COMPLETE METABOLIC PANEL WITH GFR
AG Ratio: 1.7 (calc) (ref 1.0–2.5)
ALT: 17 U/L (ref 9–46)
AST: 17 U/L (ref 10–35)
Albumin: 4.2 g/dL (ref 3.6–5.1)
Alkaline phosphatase (APISO): 91 U/L (ref 35–144)
BUN: 7 mg/dL (ref 7–25)
CO2: 29 mmol/L (ref 20–32)
Calcium: 9 mg/dL (ref 8.6–10.3)
Chloride: 102 mmol/L (ref 98–110)
Creat: 1.02 mg/dL (ref 0.70–1.25)
GFR, Est African American: 90 mL/min/{1.73_m2} (ref >=60)
GFR, Est Non African American: 78 mL/min/{1.73_m2} (ref >=60)
Globulin: 2.5 g/dL (calc) (ref 1.9–3.7)
Glucose, Bld: 61 mg/dL — ABNORMAL LOW (ref 65–139)
Potassium: 3.7 mmol/L (ref 3.5–5.3)
Sodium: 139 mmol/L (ref 135–146)
Total Bilirubin: 1 mg/dL (ref 0.2–1.2)
Total Protein: 6.7 g/dL (ref 6.1–8.1)

## 2019-02-02 LAB — PROTIME-INR
INR: 1.2 — ABNORMAL HIGH
Prothrombin Time: 12.2 s — ABNORMAL HIGH (ref 9.0–11.5)

## 2019-02-02 LAB — AFP TUMOR MARKER: AFP-Tumor Marker: 132.3 ng/mL — ABNORMAL HIGH (ref <6.1)

## 2019-02-09 ENCOUNTER — Encounter: Payer: Self-pay | Admitting: Radiology

## 2019-02-09 ENCOUNTER — Ambulatory Visit (HOSPITAL_COMMUNITY)
Admission: RE | Admit: 2019-02-09 | Discharge: 2019-02-09 | Disposition: A | Discharge status: Home / Self Care | Payer: Medicaid Other | Source: Ambulatory Visit | Attending: Interventional Radiology | Admitting: Interventional Radiology

## 2019-02-09 ENCOUNTER — Ambulatory Visit
Admission: RE | Admit: 2019-02-09 | Discharge: 2019-02-09 | Disposition: A | Discharge status: Home / Self Care | Payer: Medicaid Other | Source: Ambulatory Visit | Attending: Interventional Radiology | Admitting: Interventional Radiology

## 2019-02-09 DIAGNOSIS — C220 Liver cell carcinoma: Secondary | ICD-10-CM | POA: Diagnosis not present

## 2019-02-09 HISTORY — PX: IR RADIOLOGIST EVAL & MGMT: IMG5224

## 2019-02-09 MED ORDER — GADOXETATE DISODIUM 0.25 MMOL/ML IV SOLN
10.0000 mL | Freq: Once | INTRAVENOUS | Status: AC | PRN
  Administered 2019-02-09: 7 mL via INTRAVENOUS

## 2019-02-09 NOTE — Progress Notes (Signed)
Referring Physician(s): Magrinat,G/[name],D  Chief Complaint: The patient is seen in follow up today s/p DEB-TACE on 06/09/14 followed by thermal ablation on 06/10/14 of right hepatic lobe [place]  History of present illness: 63 year old male with history of alcoholic cirrhosis and hepatitis C as well as hepatocellular carcinoma.  He is status post right hepatic lobe [place] DEB-TACE followed by thermal ablation in 2015.  He presents today to discuss follow-up MRI abdomen/lab results.  According to the patient he has been doing very well.  He currently denies fever, headache, chest pain, dyspnea, cough, abdominal/back pain, nausea, vomiting or bleeding.  His appetite is good.  His functional status is good.  Latest AFP from 02/01/2019 was 132.3.  Hemoglobin stable, platelets 61k, creatinine normal, LFTs normal.   Past Medical History:  Diagnosis Date  . Abnormal MRI of abdomen, liver  04/21/2014  . Anxiety   . Cirrhosis, alcoholic ([place]) 07/16/2750  . Claudication of left lower extremity (HCC) m-1  . Depression   . [place] (hepatocellular carcinoma) ([place])   . Hepatitis C   . Hypertension   . Liver mass   . Panic attack     Past Surgical History:  Procedure Laterality Date  . ABDOMINAL AORTAGRAM N/A 12/27/2013   Procedure: ABDOMINAL Maxcine Ham;  Surgeon: Conrad Candelero Arriba, MD;  Location: [facility] CATH LAB;  Service: Cardiovascular;  Laterality: N/A;  . AORTOGRAM    . COSMETIC SURGERY     facial reconstructive surgery  . ESOPHAGOGASTRODUODENOSCOPY N/A 12/17/2014   Procedure: ESOPHAGOGASTRODUODENOSCOPY (EGD);  Surgeon: Missy Sabins, MD;  Location: [facility] ENDOSCOPY;  Service: Endoscopy;  Laterality: N/A;  . ESOPHAGOGASTRODUODENOSCOPY N/A 09/05/2015   Procedure: ESOPHAGOGASTRODUODENOSCOPY (EGD);  Surgeon: Wonda Horner, MD;  Location: [facility] ENDOSCOPY;  Service: Endoscopy;  Laterality: N/A;  . IR GENERIC HISTORICAL  01/19/2015   IR RADIOLOGIST EVAL & MGMT 01/19/2015 Jacqulynn Cadet, MD [facility] INTERV RAD  . IR GENERIC  HISTORICAL  07/23/2016   IR RADIOLOGIST EVAL & MGMT 07/23/2016 Jacqulynn Cadet, MD [facility] INTERV RAD  . IR GENERIC HISTORICAL  05/30/2015   IR RADIOLOGIST EVAL & MGMT 05/30/2015 [facility] INTERV RAD  . IR GENERIC HISTORICAL  10/04/2014   IR RADIOLOGIST EVAL & MGMT 10/04/2014 Jacqulynn Cadet, MD [facility] INTERV RAD  . IR GENERIC HISTORICAL  02/11/2017   IR RADIOLOGIST EVAL & MGMT 02/11/2017 Jacqulynn Cadet, MD [facility] INTERV RAD  . IR RADIOLOGIST EVAL & MGMT  08/13/2017  . IR RADIOLOGIST EVAL & MGMT  08/11/2018  . LOWER EXTREMITY ANGIOGRAM Bilateral 12/27/2013   Procedure: LOWER EXTREMITY ANGIOGRAM;  Surgeon: Conrad Carter, MD;  Location: [facility] CATH LAB;  Service: Cardiovascular;  Laterality: Bilateral;    Allergies: Folic acid and Oxycodone  Medications: Prior to Admission medications   Medication Sig Start Date End Date Taking? Authorizing Provider  ALPRAZolam Duanne Moron) 0.5 MG tablet Take 0.5 mg by mouth 2 (two) times daily.    Yes [provider]  bisacodyl (DULCOLAX) 5 MG EC tablet Take 5 mg by mouth daily as needed for mild constipation or moderate constipation. Reported on 04/03/2016   Yes [provider]  FLUoxetine (PROZAC) 40 MG capsule Take 1 capsule by mouth every morning. 05/02/15  Yes [provider]  furosemide (LASIX) 20 MG tablet TAKE 1 TABLET(20 MG) BY MOUTH DAILY 11/23/18  Yes Azzie Glatter, FNP  gabapentin (NEURONTIN) 100 MG capsule TAKE 1 CAPSULE(100 MG) BY MOUTH THREE TIMES DAILY 01/20/19  Yes Azzie Glatter, FNP  hydrOXYzine (ATARAX/VISTARIL) 10 MG tablet TAKE 1 TABLET(10  MG) BY MOUTH THREE TIMES DAILY AS NEEDED 10/28/18  Yes Azzie Glatter, FNP  pantoprazole (PROTONIX) 40 MG tablet TAKE 1 TABLET(40 MG) BY MOUTH TWICE DAILY BEFORE A MEAL 01/11/19  Yes Azzie Glatter, FNP  spironolactone (ALDACTONE) 25 MG tablet TAKE 3 TABLETS BY MOUTH DAILY. RESUME IN 2-3 DAYS 09/29/18  Yes Azzie Glatter, FNP  vitamin C (ASCORBIC ACID) 250 MG tablet Take 500 mg by  mouth daily.   Yes [provider]  FEROSUL 325 (65 Fe) MG tablet TAKE 1 TABLET(325 MG) BY MOUTH DAILY WITH BREAKFAST Patient not taking: Reported on 06/17/2018 11/10/17   Scot Jun, FNP     Family History  Problem Relation Age of Onset  . Diabetes Father   . Diabetes Brother   . Diabetes Brother     Social History   Socioeconomic History  . Marital status: Single    Spouse name: Not on file  . Number of children: 3  . Years of education: Not on file  . Highest education level: Not on file  Occupational History    Employer: [employer]  . Financial resource strain: Not on file  . Food insecurity:    Worry: Not on file    Inability: Not on file  . Transportation needs:    Medical: Not on file    Non-medical: Not on file  Tobacco Use  . Smoking status: Former Smoker    Packs/day: 0.50    Last attempt to quit: 08/21/2011    Years since quitting: 7.4  . Smokeless tobacco: Never Used  Substance and Sexual Activity  . Alcohol use: No    Alcohol/week: 0.0 standard drinks    Comment: Occasional beer on the weekends  . Drug use: No    Types: Cocaine    Comment: abused cocaine in the 80's  . Sexual activity: Never  Lifestyle  . Physical activity:    Days per week: Not on file    Minutes per session: Not on file  . Stress: Not on file  Relationships  . Social connections:    Talks on phone: Not on file    Gets together: Not on file    Attends religious service: Not on file    Active member of club or organization: Not on file    Attends meetings of clubs or organizations: Not on file    Relationship status: Not on file  Other Topics Concern  . Not on file  Social History Narrative   Single, never married.  Lives alone.  3 daughters, 16 grandchildren who are not very involved in the patient's care.  Quit smoking and heavy drinking 3.5 years ago.      Vital Signs: BP (!) 155/73   Pulse (!) 57   Temp 97.9 F (36.6 C) (Oral)   Resp 13    Ht 5' 8.5" (1.74 m)   Wt 180 lb (81.6 kg)   SpO2 94%   BMI 26.97 kg/m   Physical Exam awake, alert.  Chest clear to auscultation bilaterally.  Heart with regular rate and rhythm.  Abdomen soft, positive bowel sounds,  nontender; no lower extremity edema.  Imaging: Mr Abdomen Wwo Contrast  Result Date: 02/09/2019 CLINICAL DATA:  Hepatocellular carcinoma, prior DEB-TACE and microwave ablation both in June 2015. Restaging assessment. EXAM: MRI ABDOMEN WITHOUT AND WITH CONTRAST TECHNIQUE: Multiplanar multisequence MR imaging of the abdomen was performed both before and after the administration of intravenous contrast. CONTRAST:  49mL EOVIST  GADOXETATE DISODIUM 0.25 MOL/L IV SOLN COMPARISON:  08/11/2018 FINDINGS: Lower chest: Unremarkable Hepatobiliary: Cirrhosis. Multiple small gallstones without biliary dilatation. The ablation defect remains nonenhancing although there is a 1.2 by 1.3 cm enhancing focus also in segment 7 of the liver on image [id number] showing late arterial phase and portal venous phase enhancement but subsequent delayed isoenhancement. On the delayed 20 minutes images this lesion is isoenhancing. This is a roughly similar appearance to the 08/11/2018 exam and the size of the lesion is unchanged. This continues to be LI-RADS category LR-3. A 1.7 by 1.3 cm focus of late arterial phase enhancement in segment 6 of the liver on image [id number] is present, slightly more sharply defined than on the prior exam but roughly similar in size, with isoenhancement on later phase images. This is likewise LI-RADS LR-3. Pancreas:  Unremarkable Spleen: The spleen measures 13.4 by 6.9 by 16.7 cm (volume = 810 cm^3). No focal splenic lesions are identified. Adrenals/Urinary Tract:  Unremarkable Stomach/Bowel: Unremarkable Vascular/Lymphatic: Splenorenal shunting. Aortoiliac atherosclerotic vascular disease. Other:  No supplemental non-categorized findings. Musculoskeletal: Mild dextroconvex lumbar scoliosis.  Lower lumbar degenerative disc disease. IMPRESSION: 1. LI-RADS category LR-3 lesion in segment 7 of the liver and separate LI-RADS category LR-3 lesion in segment 6 of the liver, both essentially stable in size from 08/11/2018. Surveillance imaging is suggested. 2. Hepatic cirrhosis with splenorenal shunting indicating portal venous hypertension. 3. No recurrence along the ablation site itself. 4. Multiple gallstones. 5.  Aortic Atherosclerosis (ICD10-I70.0). 6. Splenomegaly. Electronically Signed   By: Van Clines M.D.   On: 02/09/2019 10:30    Labs:  CBC: Recent Labs    04/06/18 1424 07/27/18 1103 02/01/19 0948  WBC 4.8 4.2 3.4*  HGB 15.9 15.3 14.8  HCT 46.7 44.7 45.1  PLT 58* 61* 61*    COAGS: Recent Labs    04/06/18 1424 07/27/18 1103 02/01/19 0948  INR 1.1 1.1 1.2*    BMP: Recent Labs    04/06/18 1424 07/27/18 1103 02/01/19 0948  NA 139 143 139  K 3.7 3.9 3.7  CL 101 105 102  CO2 30 29 29   GLUCOSE 102* 72 61*  BUN 10 10 7   CALCIUM 9.3 9.2 9.0  CREATININE 1.02 0.94 1.02  GFRNONAA 78  --  78  GFRAA 90  --  90    LIVER FUNCTION TESTS: Recent Labs    04/06/18 1424 07/27/18 1103 02/01/19 0948  BILITOT 1.2 1.0 1.0  AST 16 13 17   ALT 18 14 17   PROT 7.4 7.1 6.7    Assessment: 63 year old male with history of alcoholic cirrhosis and hepatitis C as well as hepatocellular carcinoma.  He is status post right hepatic lobe [place] DEB-TACE followed by thermal ablation in 2015.  He presents today to discuss follow-up MRI abdomen/lab results.    Patient currently stable and asymptomatic.  MRI abdomen performed today revealed:  1. LI-RADS category LR-3 lesion in segment 7 of the liver and separate LI-RADS category LR-3 lesion in segment 6 of the liver, both essentially stable in size from 08/11/2018. Surveillance imaging is suggested. 2. Hepatic cirrhosis with splenorenal shunting indicating portal venous hypertension. 3. No recurrence along the ablation site  itself. 4. Multiple gallstones. 5.  Aortic Atherosclerosis (ICD10-I70.0). 6. Splenomegaly  Labs done on 02/01/2019 revealed AFP of 132.3 down slightly from 133.7  6 months ago, normal LFTs, platelets 61k, hemoglobin 14.8, WBC 3.4. Patient seen by Dr. Laurence Ferrari.  At this point recommend one-year follow-up MRI abdomen and lab studies.  Patient told to contact our service with any additional questions or concerns.   Signed: D. Rowe Robert, PA-C 02/09/2019, 12:11 PM   Please refer to Dr. Katrinka Blazing attestation of this note for management and plan.      Patient ID: Jeffrey Snow, male   DOB: [birth year], 63 y.o.   MRN: [id number]

## 2019-02-18 ENCOUNTER — Other Ambulatory Visit: Payer: Self-pay | Admitting: Family Medicine

## 2019-02-18 DIAGNOSIS — L299 Pruritus, unspecified: Secondary | ICD-10-CM

## 2019-03-04 ENCOUNTER — Other Ambulatory Visit: Payer: Self-pay | Admitting: Family Medicine

## 2019-03-05 ENCOUNTER — Telehealth: Payer: Self-pay

## 2019-03-08 NOTE — Telephone Encounter (Signed)
Refilled called into pharmacy 

## 2019-03-18 ENCOUNTER — Other Ambulatory Visit: Payer: Self-pay | Admitting: Family Medicine

## 2019-03-18 DIAGNOSIS — G629 Polyneuropathy, unspecified: Secondary | ICD-10-CM

## 2019-03-18 MED ORDER — GABAPENTIN 100 MG PO CAPS: ORAL_CAPSULE | ORAL | 5 refills | Status: DC

## 2019-03-19 ENCOUNTER — Other Ambulatory Visit: Payer: Self-pay | Admitting: Family Medicine

## 2019-03-19 ENCOUNTER — Telehealth: Payer: Self-pay

## 2019-03-19 DIAGNOSIS — K7030 Alcoholic cirrhosis of liver without ascites: Secondary | ICD-10-CM

## 2019-03-19 DIAGNOSIS — K7031 Alcoholic cirrhosis of liver with ascites: Secondary | ICD-10-CM

## 2019-03-19 MED ORDER — SPIRONOLACTONE 25 MG PO TABS: ORAL_TABLET | ORAL | 1 refills | Status: DC

## 2019-03-19 NOTE — Telephone Encounter (Signed)
Left a vm for patient to callback 

## 2019-03-19 NOTE — Telephone Encounter (Signed)
Per patient is taking the Spironolactone  because he is being treated for liver cancer and to prevent fluid from building up on his liver. Dr. Teena Irani at [facility] gastro was who started him on it.

## 2019-03-22 NOTE — Telephone Encounter (Signed)
Patient picked up medication

## 2019-04-13 NOTE — Telephone Encounter (Signed)
Message sent to provider 

## 2019-04-22 NOTE — Telephone Encounter (Signed)
Message sent to provider 

## 2019-05-03 ENCOUNTER — Telehealth: Payer: Self-pay

## 2019-05-03 ENCOUNTER — Other Ambulatory Visit: Payer: Self-pay | Admitting: Family Medicine

## 2019-05-03 DIAGNOSIS — L299 Pruritus, unspecified: Secondary | ICD-10-CM

## 2019-05-04 ENCOUNTER — Other Ambulatory Visit: Payer: Self-pay

## 2019-05-04 DIAGNOSIS — L299 Pruritus, unspecified: Secondary | ICD-10-CM

## 2019-05-04 MED ORDER — HYDROXYZINE HCL 10 MG PO TABS: ORAL_TABLET | ORAL | 2 refills | Status: DC

## 2019-05-04 NOTE — Telephone Encounter (Signed)
Medication sent to pharmacy  

## 2019-05-05 NOTE — Telephone Encounter (Signed)
Sent to provider 

## 2019-05-23 ENCOUNTER — Other Ambulatory Visit: Payer: Self-pay | Admitting: Family Medicine

## 2019-05-23 DIAGNOSIS — K7031 Alcoholic cirrhosis of liver with ascites: Secondary | ICD-10-CM

## 2019-05-26 ENCOUNTER — Telehealth: Payer: Self-pay | Admitting: Family Medicine

## 2019-05-26 NOTE — Telephone Encounter (Signed)
Sent to nurse

## 2019-05-27 ENCOUNTER — Other Ambulatory Visit: Payer: Self-pay | Admitting: Family Medicine

## 2019-05-27 DIAGNOSIS — K7031 Alcoholic cirrhosis of liver with ascites: Secondary | ICD-10-CM

## 2019-05-27 NOTE — Telephone Encounter (Signed)
Left a vm for patient

## 2019-05-28 ENCOUNTER — Telehealth: Payer: Self-pay

## 2019-05-28 NOTE — Telephone Encounter (Signed)
Patient pick up medication today per [place]

## 2019-07-07 ENCOUNTER — Ambulatory Visit: Payer: Self-pay | Admitting: Family Medicine

## 2019-07-10 ENCOUNTER — Other Ambulatory Visit: Payer: Self-pay | Admitting: Family Medicine

## 2019-07-10 DIAGNOSIS — L299 Pruritus, unspecified: Secondary | ICD-10-CM

## 2019-07-19 ENCOUNTER — Other Ambulatory Visit: Payer: Self-pay | Admitting: Family Medicine

## 2019-07-19 DIAGNOSIS — K7031 Alcoholic cirrhosis of liver with ascites: Secondary | ICD-10-CM

## 2019-07-28 ENCOUNTER — Other Ambulatory Visit: Payer: Self-pay

## 2019-07-28 ENCOUNTER — Ambulatory Visit (INDEPENDENT_AMBULATORY_CARE_PROVIDER_SITE_OTHER): Payer: Medicaid Other | Admitting: Family Medicine

## 2019-07-28 DIAGNOSIS — Z538 Procedure and treatment not carried out for other reasons: Secondary | ICD-10-CM

## 2019-07-28 NOTE — Progress Notes (Deleted)
Virtual Visit via Telephone Note  I connected with Jeffrey Snow on 07/28/19 at 11:20 AM EDT by telephone and verified that I am speaking with the correct person using two identifiers.   I discussed the limitations, risks, security and privacy concerns of performing an evaluation and management service by telephone and the availability of in person appointments. I also discussed with the patient that there may be a patient responsible charge related to this service. The patient expressed understanding and agreed to proceed.   History of Present Illness:  Past Medical History:  Diagnosis Date  . Abnormal MRI of abdomen, liver  04/21/2014  . Anxiety   . Cirrhosis, alcoholic ([place]) 08/21/931  . Claudication of left lower extremity (HCC) m-1  . Depression   . [place] (hepatocellular carcinoma) ([place])   . Hepatitis C   . Hypertension   . Liver mass   . Panic attack     Current Status: Since hi last office visit, he is doing well with no complaints.         He denies fevers, chills, fatigue, recent infections, weight loss, and night sweats. He has not had any headaches, visual changes, dizziness, and falls. No chest pain, heart palpitations, cough and shortness of breath reported. No reports of GI problems such as nausea, vomiting, diarrhea, and constipation. He has no reports of blood in stools, dysuria and hematuria. No depression or anxiety, and denies suicidal ideations, homicidal ideations, or auditory hallucinations. He denies pain today.        Observations/Objective:  Telephone Virtual Visit   Assessment and Plan:     Follow Up Instructions:    I discussed the assessment and treatment plan with the patient. The patient was provided an opportunity to ask questions and all were answered. The patient agreed with the plan and demonstrated an understanding of the instructions.   The patient was advised to call back or seek an in-person evaluation if the symptoms worsen  or if the condition fails to improve as anticipated.  I provided *** minutes of non-face-to-face time during this encounter.   Azzie Glatter, FNP

## 2019-08-10 ENCOUNTER — Ambulatory Visit: Payer: Medicaid Other | Admitting: Family Medicine

## 2019-08-10 ENCOUNTER — Other Ambulatory Visit: Payer: Self-pay

## 2019-09-06 ENCOUNTER — Other Ambulatory Visit: Payer: Self-pay | Admitting: Family Medicine

## 2019-09-06 DIAGNOSIS — K7031 Alcoholic cirrhosis of liver with ascites: Secondary | ICD-10-CM

## 2019-09-08 ENCOUNTER — Other Ambulatory Visit: Payer: Self-pay | Admitting: Family Medicine

## 2019-09-08 DIAGNOSIS — K7031 Alcoholic cirrhosis of liver with ascites: Secondary | ICD-10-CM

## 2019-09-08 DIAGNOSIS — L299 Pruritus, unspecified: Secondary | ICD-10-CM

## 2019-09-09 ENCOUNTER — Other Ambulatory Visit: Payer: Self-pay | Admitting: Family Medicine

## 2019-09-22 ENCOUNTER — Other Ambulatory Visit: Payer: Self-pay | Admitting: Family Medicine

## 2019-09-22 DIAGNOSIS — G629 Polyneuropathy, unspecified: Secondary | ICD-10-CM

## 2019-11-04 ENCOUNTER — Telehealth: Payer: Self-pay | Admitting: Family Medicine

## 2019-11-04 ENCOUNTER — Telehealth: Payer: Self-pay | Admitting: Internal Medicine

## 2019-11-04 ENCOUNTER — Other Ambulatory Visit: Payer: Self-pay

## 2019-11-04 DIAGNOSIS — L299 Pruritus, unspecified: Secondary | ICD-10-CM

## 2019-11-04 MED ORDER — HYDROXYZINE HCL 10 MG PO TABS: 10.0000 mg | ORAL_TABLET | Freq: Three times a day (TID) | ORAL | 2 refills | Status: DC | PRN

## 2019-11-04 NOTE — Telephone Encounter (Signed)
error 

## 2019-11-07 ENCOUNTER — Other Ambulatory Visit: Payer: Self-pay | Admitting: Family Medicine

## 2019-11-07 DIAGNOSIS — K7031 Alcoholic cirrhosis of liver with ascites: Secondary | ICD-10-CM

## 2019-11-08 NOTE — Telephone Encounter (Signed)
done

## 2019-11-12 ENCOUNTER — Other Ambulatory Visit: Payer: Self-pay | Admitting: Family Medicine

## 2019-11-15 NOTE — Telephone Encounter (Signed)
Patient has missed a few follow up appointments. He has requested a refill Lasix 20 MG. Please advise.

## 2019-11-22 ENCOUNTER — Telehealth: Payer: Self-pay | Admitting: Internal Medicine

## 2019-11-23 NOTE — Telephone Encounter (Signed)
done

## 2019-11-26 ENCOUNTER — Other Ambulatory Visit: Payer: Self-pay

## 2019-11-26 ENCOUNTER — Ambulatory Visit (INDEPENDENT_AMBULATORY_CARE_PROVIDER_SITE_OTHER): Payer: Medicaid Other | Admitting: Family Medicine

## 2019-11-26 ENCOUNTER — Other Ambulatory Visit: Payer: Self-pay | Admitting: Family Medicine

## 2019-11-26 ENCOUNTER — Encounter: Payer: Self-pay | Admitting: Family Medicine

## 2019-11-26 VITALS — BP 142/74 | HR 60 | Temp 97.8°F | Ht 68.5 in | Wt 182.6 lb

## 2019-11-26 DIAGNOSIS — C220 Liver cell carcinoma: Secondary | ICD-10-CM | POA: Diagnosis not present

## 2019-11-26 DIAGNOSIS — Z09 Encounter for follow-up examination after completed treatment for conditions other than malignant neoplasm: Secondary | ICD-10-CM

## 2019-11-26 DIAGNOSIS — Z0000 Encounter for general adult medical examination without abnormal findings: Secondary | ICD-10-CM

## 2019-11-26 DIAGNOSIS — F419 Anxiety disorder, unspecified: Secondary | ICD-10-CM

## 2019-11-26 DIAGNOSIS — I10 Essential (primary) hypertension: Secondary | ICD-10-CM

## 2019-11-26 DIAGNOSIS — R062 Wheezing: Secondary | ICD-10-CM

## 2019-11-26 DIAGNOSIS — K7031 Alcoholic cirrhosis of liver with ascites: Secondary | ICD-10-CM

## 2019-11-26 DIAGNOSIS — G629 Polyneuropathy, unspecified: Secondary | ICD-10-CM

## 2019-11-26 DIAGNOSIS — L299 Pruritus, unspecified: Secondary | ICD-10-CM

## 2019-11-26 LAB — POCT URINALYSIS DIPSTICK
Bilirubin, UA: NEGATIVE
Blood, UA: NEGATIVE
Glucose, UA: NEGATIVE
Ketones, UA: NEGATIVE
Leukocytes, UA: NEGATIVE
Nitrite, UA: NEGATIVE
Protein, UA: NEGATIVE
Spec Grav, UA: 1.02 (ref 1.010–1.025)
Urobilinogen, UA: 0.2 E.U./dL
pH, UA: 5.5 (ref 5.0–8.0)

## 2019-11-26 LAB — POCT GLYCOSYLATED HEMOGLOBIN (HGB A1C): Hemoglobin A1C: 5.8 % — AB (ref 4.0–5.6)

## 2019-11-26 LAB — GLUCOSE, POCT (MANUAL RESULT ENTRY): POC Glucose: 126 mg/dl — AB (ref 70–99)

## 2019-11-26 MED ORDER — PANTOPRAZOLE SODIUM 40 MG PO TBEC: DELAYED_RELEASE_TABLET | ORAL | 1 refills | Status: DC

## 2019-11-26 MED ORDER — FLUOXETINE HCL 40 MG PO CAPS: 40.0000 mg | ORAL_CAPSULE | Freq: Every morning | ORAL | 6 refills | Status: DC

## 2019-11-26 MED ORDER — GABAPENTIN 100 MG PO CAPS: ORAL_CAPSULE | ORAL | 6 refills | Status: DC

## 2019-11-26 MED ORDER — ALBUTEROL SULFATE HFA 108 (90 BASE) MCG/ACT IN AERS: 2.0000 | INHALATION_SPRAY | Freq: Four times a day (QID) | RESPIRATORY_TRACT | 11 refills | Status: DC | PRN

## 2019-11-26 MED ORDER — HYDROXYZINE HCL 10 MG PO TABS: 10.0000 mg | ORAL_TABLET | Freq: Three times a day (TID) | ORAL | 6 refills | Status: DC | PRN

## 2019-11-26 MED ORDER — SPIRONOLACTONE 25 MG PO TABS: 75.0000 mg | ORAL_TABLET | Freq: Every day | ORAL | 6 refills | Status: DC

## 2019-11-26 MED ORDER — FUROSEMIDE 20 MG PO TABS: ORAL_TABLET | ORAL | 1 refills | Status: DC

## 2019-11-26 NOTE — Patient Instructions (Signed)
Albuterol inhalation aerosol What is this medicine? ALBUTEROL (al [place]) is a bronchodilator. It helps open up the airways in your lungs to make it easier to breathe. This medicine is used to treat and to prevent bronchospasm. This medicine may be used for other purposes; ask your health care provider or pharmacist if you have questions. COMMON BRAND NAME(S): Proair HFA, Proventil, Proventil HFA, Respirol, Ventolin, Ventolin HFA What should I tell my health care provider before I take this medicine? They need to know if you have any of the following conditions:  diabetes  heart disease or irregular heartbeat  high blood pressure  pheochromocytoma  seizures  thyroid disease  an unusual or allergic reaction to albuterol, levalbuterol, other medicines, foods, dyes, or preservatives  pregnant or trying to get pregnant  breast-feeding How should I use this medicine? This medicine is for inhalation through the mouth. Follow the directions on your prescription label. Take your medicine at regular intervals. Do not use more often than directed. Make sure that you are using your inhaler correctly. Ask your doctor or health care provider if you have any questions. Talk to your pediatrician regarding the use of this medicine in children. While this drug may be prescribed for children as young as 4 years for selected conditions, precautions do apply. Overdosage: If you think you have taken too much of this medicine contact a poison control center or emergency room at once. NOTE: This medicine is only for you. Do not share this medicine with others. What if I miss a dose? If you miss a dose, use it as soon as you can. If it is almost time for your next dose, use only that dose. Do not use double or extra doses. What may interact with this medicine?  anti-infectives like chloroquine and pentamidine  caffeine  cisapride  diuretics  medicines for colds  medicines for depression or  for emotional or psychotic conditions  medicines for weight loss including some herbal products  methadone  some antibiotics like clarithromycin, erythromycin, levofloxacin, and linezolid  some heart medicines  steroid hormones like dexamethasone, cortisone, hydrocortisone  theophylline  thyroid hormones This list may not describe all possible interactions. Give your health care provider a list of all the medicines, herbs, non-prescription drugs, or dietary supplements you use. Also tell them if you smoke, drink alcohol, or use illegal drugs. Some items may interact with your medicine. What should I watch for while using this medicine? Tell your doctor or health care professional if your symptoms do not improve. Do not use extra albuterol. If your asthma or bronchitis gets worse while you are using this medicine, call your doctor right away. If your mouth gets dry try chewing sugarless gum or sucking hard candy. Drink water as directed. What side effects may I notice from receiving this medicine? Side effects that you should report to your doctor or health care professional as soon as possible:  allergic reactions like skin rash, itching or hives, swelling of the face, lips, or tongue  breathing problems  chest pain  feeling faint or lightheaded, falls  high blood pressure  irregular heartbeat  fever  muscle cramps or weakness  pain, tingling, numbness in the hands or feet  vomiting Side effects that usually do not require medical attention (report to your doctor or health care professional if they continue or are bothersome):  changes in taste  cough  dry mouth  headache  nervousness or trembling  stomach upset  stuffy or  runny nose  throat irritation  trouble sleeping This list may not describe all possible side effects. Call your doctor for medical advice about side effects. You may report side effects to FDA at 1-800-FDA-1088. Where should I keep my  medicine? Keep out of the reach of children. Store Proventil HFA and ProAir HFA at room temperature between 15 and 25 degrees C (59 and 77 degrees F). Store Ventolin HFA at room temperature between 20 and 25 degrees C (68 and 77 degrees F); it may be stored between 15 and 30 degrees C (59 and 86 degrees F) on occasion. The contents are under pressure and may burst when exposed to heat or flame. Do not freeze. This medicine does not work as well if it is too cold. Throw away the inhaler when the dose counter displays "0" or after the expiration date on the package, whichever comes first. Ventolin HFA should be thrown away 12 months after removing it from the foil pouch. NOTE: This sheet is a summary. It may not cover all possible information. If you have questions about this medicine, talk to your doctor, pharmacist, or health care provider.  2020 Elsevier/Gold Standard (2019-03-18 12:46:54)  

## 2019-11-26 NOTE — Progress Notes (Signed)
Patient [facility]  Established Patient Office Visit  Subjective:  Patient ID: Jeffrey Snow, male    DOB: Aug 17, 1956  Age: 63 y.o. MRN: [id number]  CC:  Chief Complaint  Patient presents with  . Follow-up    HTN, DM    HPI Jeffrey Snow is a 63 year old male who presents for Follow Up today.   Past Medical History:  Diagnosis Date  . Abnormal MRI of abdomen, liver  04/21/2014  . Anxiety   . Cirrhosis, alcoholic ([place]) 03/22/961  . Claudication of left lower extremity (HCC) m-1  . Depression   . [place] (hepatocellular carcinoma) ([place])   . Hepatitis C   . Hypertension   . Liver mass   . Panic attack    Current Status: Since his last office visit, he is doing well with no complaints.  He denies visual changes, chest pain, cough, shortness of breath, heart palpitations, and falls. He has occasional headaches and dizziness with position changes. Denies severe headaches, confusion, seizures, double vision, and blurred vision, nausea and vomiting. His moderate is stable today. His next appointment with Psychiatry 12/2019. He has follow up with Oncologist February 2021. He denies fevers, chills, fatigue, recent infections, weight loss, and night sweats. No reports of GI problems such as diarrhea, and constipation. He has no reports of blood in stools, dysuria and hematuria. He denies pain today.   Past Surgical History:  Procedure Laterality Date  . ABDOMINAL AORTAGRAM N/A 12/27/2013   Procedure: ABDOMINAL Maxcine Ham;  Surgeon: Conrad Livingston, MD;  Location: [facility] CATH LAB;  Service: Cardiovascular;  Laterality: N/A;  . AORTOGRAM    . COSMETIC SURGERY     facial reconstructive surgery  . ESOPHAGOGASTRODUODENOSCOPY N/A 12/17/2014   Procedure: ESOPHAGOGASTRODUODENOSCOPY (EGD);  Surgeon: Missy Sabins, MD;  Location: [facility] ENDOSCOPY;  Service: Endoscopy;  Laterality: N/A;  . ESOPHAGOGASTRODUODENOSCOPY N/A 09/05/2015   Procedure:  ESOPHAGOGASTRODUODENOSCOPY (EGD);  Surgeon: Wonda Horner, MD;  Location: [facility] ENDOSCOPY;  Service: Endoscopy;  Laterality: N/A;  . IR GENERIC HISTORICAL  01/19/2015   IR RADIOLOGIST EVAL & MGMT 01/19/2015 Jacqulynn Cadet, MD [facility] INTERV RAD  . IR GENERIC HISTORICAL  07/23/2016   IR RADIOLOGIST EVAL & MGMT 07/23/2016 Jacqulynn Cadet, MD [facility] INTERV RAD  . IR GENERIC HISTORICAL  05/30/2015   IR RADIOLOGIST EVAL & MGMT 05/30/2015 [facility] INTERV RAD  . IR GENERIC HISTORICAL  10/04/2014   IR RADIOLOGIST EVAL & MGMT 10/04/2014 Jacqulynn Cadet, MD [facility] INTERV RAD  . IR GENERIC HISTORICAL  02/11/2017   IR RADIOLOGIST EVAL & MGMT 02/11/2017 Jacqulynn Cadet, MD [facility] INTERV RAD  . IR RADIOLOGIST EVAL & MGMT  08/13/2017  . IR RADIOLOGIST EVAL & MGMT  08/11/2018  . IR RADIOLOGIST EVAL & MGMT  02/09/2019  . LOWER EXTREMITY ANGIOGRAM Bilateral 12/27/2013   Procedure: LOWER EXTREMITY ANGIOGRAM;  Surgeon: Conrad Mertzon, MD;  Location: [facility] CATH LAB;  Service: Cardiovascular;  Laterality: Bilateral;    Family History  Problem Relation Age of Onset  . Diabetes Father   . Diabetes Brother   . Diabetes Brother     Social History   Socioeconomic History  . Marital status: Single    Spouse name: Not on file  . Number of children: 3  . Years of education: Not on file  . Highest education level: Not on file  Occupational History    Employer: [employer]  Tobacco Use  . Smoking status: Former Smoker  Packs/day: 0.50    Quit date: 08/21/2011    Years since quitting: 8.2  . Smokeless tobacco: Never Used  Substance and Sexual Activity  . Alcohol use: No    Alcohol/week: 0.0 standard drinks    Comment: Occasional beer on the weekends  . Drug use: No    Types: Cocaine    Comment: abused cocaine in the 80's  . Sexual activity: Not Currently  Other Topics Concern  . Not on file  Social History Narrative   Single, never married.  Lives alone.  3 daughters, 16 grandchildren who are not very involved in the  patient's care.  Quit smoking and heavy drinking 3.5 years ago.    Social Determinants of Health   Financial Resource Strain:   . Difficulty of Paying Living Expenses: Not on file  Food Insecurity:   . Worried About Charity fundraiser in the Last Year: Not on file  . Ran Out of Food in the Last Year: Not on file  Transportation Needs:   . Lack of Transportation (Medical): Not on file  . Lack of Transportation (Non-Medical): Not on file  Physical Activity:   . Days of Exercise per Week: Not on file  . Minutes of Exercise per Session: Not on file  Stress:   . Feeling of Stress : Not on file  Social Connections:   . Frequency of Communication with Friends and Family: Not on file  . Frequency of Social Gatherings with Friends and Family: Not on file  . Attends Religious Services: Not on file  . Active Member of Clubs or Organizations: Not on file  . Attends Archivist Meetings: Not on file  . Marital Status: Not on file  Intimate Partner Violence:   . Fear of Current or Ex-Partner: Not on file  . Emotionally Abused: Not on file  . Physically Abused: Not on file  . Sexually Abused: Not on file    Outpatient Medications Prior to Visit  Medication Sig Dispense Refill  . ALPRAZolam (XANAX) 0.5 MG tablet Take 0.5 mg by mouth 2 (two) times daily.     . bisacodyl (DULCOLAX) 5 MG EC tablet Take 5 mg by mouth daily as needed for mild constipation or moderate constipation. Reported on 04/03/2016    . vitamin C (ASCORBIC ACID) 250 MG tablet Take 500 mg by mouth daily.    Marland Kitchen FLUoxetine (PROZAC) 40 MG capsule Take 1 capsule by mouth every morning.  2  . furosemide (LASIX) 20 MG tablet TAKE 1 TABLET BY MOUTH EVERY MORNING 90 tablet 0  . gabapentin (NEURONTIN) 100 MG capsule TAKE 1 CAPSULE(100 MG) BY MOUTH THREE TIMES DAILY 90 capsule 5  . hydrOXYzine (ATARAX/VISTARIL) 10 MG tablet Take 1 tablet (10 mg total) by mouth 3 (three) times daily as needed. 90 tablet 2  . pantoprazole  (PROTONIX) 40 MG tablet TAKE 1 TABLET(40 MG) BY MOUTH TWICE DAILY BEFORE A MEAL 180 tablet 1  . spironolactone (ALDACTONE) 25 MG tablet TAKE 3 TABLETS BY MOUTH DAILY 90 tablet 0  . FEROSUL 325 (65 Fe) MG tablet TAKE 1 TABLET(325 MG) BY MOUTH DAILY WITH BREAKFAST (Patient not taking: Reported on 06/17/2018) 30 tablet 0  . hydrOXYzine (ATARAX/VISTARIL) 10 MG tablet TAKE 1 TABLET BY MOUTH THREE TIMES DAILY AS NEEDED 90 tablet 1   No facility-administered medications prior to visit.    Allergies  Allergen Reactions  . Folic Acid Other (See Comments)    Makes stomach feel funny  . Oxycodone Itching  and Other (See Comments)    Cause insomnia    ROS Review of Systems  Constitutional: Negative.   HENT: Negative.   Eyes: Negative.   Respiratory: Negative.   Cardiovascular: Negative.   Gastrointestinal: Negative.   Endocrine: Negative.   Genitourinary: Negative.   Musculoskeletal: Negative.   Skin: Negative.   Allergic/Immunologic: Negative.   Neurological: Negative.   Hematological: Negative.   Psychiatric/Behavioral: Negative.       Objective:    Physical Exam  Constitutional: He is oriented to person, place, and time. He appears well-developed and well-nourished.  HENT:  Head: Normocephalic and atraumatic.  Eyes: Conjunctivae are normal.  Cardiovascular: Normal rate, regular rhythm, normal heart sounds and intact distal pulses.  Pulmonary/Chest: Effort normal. He has wheezes (wheezes heard in lower lobes).  Abdominal: Soft. Bowel sounds are normal.  Musculoskeletal:        General: Normal range of motion.     Cervical back: Normal range of motion and neck supple.  Neurological: He is alert and oriented to person, place, and time.  Skin: Skin is warm and dry.  Psychiatric: His behavior is normal. Judgment and thought content normal.  Moderately anxious today.   Nursing note and vitals reviewed.   BP (!) 142/74 Comment: reports not taking bp medication  Pulse 60   Temp  97.8 F (36.6 C) (Oral)   Ht 5' 8.5" (1.74 m)   Wt 182 lb 9.6 oz (82.8 kg)   SpO2 95%   BMI 27.36 kg/m  Wt Readings from Last 3 Encounters:  11/26/19 182 lb 9.6 oz (82.8 kg)  02/09/19 180 lb (81.6 kg)  01/06/19 177 lb 6.4 oz (80.5 kg)     Health Maintenance Due  Topic Date Due  . COLONOSCOPY  02/20/2005    There are no preventive care reminders to display for this patient.  Lab Results  Component Value Date   TSH 3.360 11/26/2019   Lab Results  Component Value Date   WBC 3.6 11/26/2019   HGB 16.0 11/26/2019   HCT 48.4 11/26/2019   MCV 87 11/26/2019   PLT 44 (LL) 11/26/2019   Lab Results  Component Value Date   NA 139 02/01/2019   K 3.7 02/01/2019   CO2 29 02/01/2019   GLUCOSE 61 (L) 02/01/2019   BUN 7 02/01/2019   CREATININE 1.02 02/01/2019   BILITOT 1.0 02/01/2019   ALKPHOS 123 (H) 12/17/2017   AST 17 02/01/2019   ALT 17 02/01/2019   PROT 6.7 02/01/2019   ALBUMIN 4.8 12/17/2017   CALCIUM 9.0 02/01/2019   ANIONGAP 6 09/08/2015   Lab Results  Component Value Date   CHOL 157 11/26/2019   Lab Results  Component Value Date   HDL 46 11/26/2019   Lab Results  Component Value Date   LDLCALC 93 11/26/2019   Lab Results  Component Value Date   TRIG 100 11/26/2019   Lab Results  Component Value Date   CHOLHDL 3.4 11/26/2019   Lab Results  Component Value Date   HGBA1C 5.8 (A) 11/26/2019      Assessment & Plan:    1. Essential hypertension The current medical regimen is effective; blood pressure is stable at 142/74 today; continue present plan and medications as prescribed. He will continue to take medications as prescribed, to decrease high sodium intake, excessive alcohol intake, increase potassium intake, smoking cessation, and increase physical activity of at least 30 minutes of cardio activity daily. He will continue to follow Heart Healthy or DASH diet. -  Urinalysis Dipstick - CBC with Differential - Comp Met (CMET); Future - Lipid  Panel - TSH - PSA - Vitamin B12 - Vitamin D, 25-hydroxy  2. Wheezing Stable today. No signs or symptoms of respiratory distress noted or reported.  - albuterol (VENTOLIN HFA) 108 (90 Base) MCG/ACT inhaler; Inhale 2 puffs into the lungs every 6 (six) hours as needed for wheezing or shortness of breath.  Dispense: 8 g; Refill: 11  3. Neuropathy Stable.  - gabapentin (NEURONTIN) 100 MG capsule; Take 1 capsule, by mouth 3 times a day.  Dispense: 90 capsule; Refill: 6  4. [place] (hepatocellular carcinoma) ([place]) Continue to follow up with Oncology as needed.   5. Pruritic condition - hydrOXYzine (ATARAX/VISTARIL) 10 MG tablet; Take 1 tablet (10 mg total) by mouth 3 (three) times daily as needed.  Dispense: 90 tablet; Refill: 6  6. Alcoholic cirrhosis of liver with ascites (HCC) - furosemide (LASIX) 20 MG tablet; TAKE 1 TABLET BY MOUTH EVERY MORNING  Dispense: 90 tablet; Refill: 1 - spironolactone (ALDACTONE) 25 MG tablet; Take 3 tablets (75 mg total) by mouth daily.  Dispense: 90 tablet; Refill: 6  7. Anxiety Moderate.   8. Healthcare maintenance - POCT HgB A1C - Glucose (CBG)  9. Follow up He will follow up in 6 months.   Meds ordered this encounter  Medications  . albuterol (VENTOLIN HFA) 108 (90 Base) MCG/ACT inhaler    Sig: Inhale 2 puffs into the lungs every 6 (six) hours as needed for wheezing or shortness of breath.    Dispense:  8 g    Refill:  11  . furosemide (LASIX) 20 MG tablet    Sig: TAKE 1 TABLET BY MOUTH EVERY MORNING    Dispense:  90 tablet    Refill:  1  . gabapentin (NEURONTIN) 100 MG capsule    Sig: Take 1 capsule, by mouth 3 times a day.    Dispense:  90 capsule    Refill:  6  . FLUoxetine (PROZAC) 40 MG capsule    Sig: Take 1 capsule (40 mg total) by mouth every morning.    Dispense:  30 capsule    Refill:  6  . hydrOXYzine (ATARAX/VISTARIL) 10 MG tablet    Sig: Take 1 tablet (10 mg total) by mouth 3 (three) times daily as needed.    Dispense:  90  tablet    Refill:  6  . pantoprazole (PROTONIX) 40 MG tablet    Sig: TAKE 1 TABLET(40 MG) BY MOUTH TWICE DAILY BEFORE A MEAL    Dispense:  180 tablet    Refill:  1  . spironolactone (ALDACTONE) 25 MG tablet    Sig: Take 3 tablets (75 mg total) by mouth daily.    Dispense:  90 tablet    Refill:  6    Orders Placed This Encounter  Procedures  . CBC with Differential  . Comp Met (CMET)  . Lipid Panel  . TSH  . PSA  . Vitamin B12  . Vitamin D, 25-hydroxy  . POCT HgB A1C  . Urinalysis Dipstick  . Glucose (CBG)    Referral Orders  No referral(s) requested today    Kathe Becton,  MSN, FNP-BC [place] [address] [place], [place] [address] [phone] fax  Problem List Items Addressed This Visit      Cardiovascular and Mediastinum   HTN (hypertension) - Primary   Relevant Medications   furosemide (LASIX)  20 MG tablet   spironolactone (ALDACTONE) 25 MG tablet   Other Relevant Orders   Urinalysis Dipstick (Completed)   CBC with Differential (Completed)   Comp Met (CMET)   Lipid Panel (Completed)   TSH (Completed)   PSA (Completed)   Vitamin B12 (Completed)   Vitamin D, 25-hydroxy (Completed)     Digestive   Cirrhosis, alcoholic (HCC)   Relevant Medications   furosemide (LASIX) 20 MG tablet   spironolactone (ALDACTONE) 25 MG tablet   HCC (hepatocellular carcinoma) (HCC)    Other Visit Diagnoses    Wheezing       Relevant Medications   albuterol (VENTOLIN HFA) 108 (90 Base) MCG/ACT inhaler   Neuropathy       Relevant Medications   gabapentin (NEURONTIN) 100 MG capsule   Pruritic condition       Relevant Medications   hydrOXYzine (ATARAX/VISTARIL) 10 MG tablet   Anxiety       Relevant Medications   FLUoxetine (PROZAC) 40 MG capsule   hydrOXYzine (ATARAX/VISTARIL) 10 MG tablet   Healthcare maintenance       Relevant Orders   POCT HgB A1C (Completed)   Glucose  (CBG) (Completed)   Follow up          Meds ordered this encounter  Medications  . albuterol (VENTOLIN HFA) 108 (90 Base) MCG/ACT inhaler    Sig: Inhale 2 puffs into the lungs every 6 (six) hours as needed for wheezing or shortness of breath.    Dispense:  8 g    Refill:  11  . furosemide (LASIX) 20 MG tablet    Sig: TAKE 1 TABLET BY MOUTH EVERY MORNING    Dispense:  90 tablet    Refill:  1  . gabapentin (NEURONTIN) 100 MG capsule    Sig: Take 1 capsule, by mouth 3 times a day.    Dispense:  90 capsule    Refill:  6  . FLUoxetine (PROZAC) 40 MG capsule    Sig: Take 1 capsule (40 mg total) by mouth every morning.    Dispense:  30 capsule    Refill:  6  . hydrOXYzine (ATARAX/VISTARIL) 10 MG tablet    Sig: Take 1 tablet (10 mg total) by mouth 3 (three) times daily as needed.    Dispense:  90 tablet    Refill:  6  . pantoprazole (PROTONIX) 40 MG tablet    Sig: TAKE 1 TABLET(40 MG) BY MOUTH TWICE DAILY BEFORE A MEAL    Dispense:  180 tablet    Refill:  1  . spironolactone (ALDACTONE) 25 MG tablet    Sig: Take 3 tablets (75 mg total) by mouth daily.    Dispense:  90 tablet    Refill:  6    Follow-up: No follow-ups on file.    Azzie Glatter, FNP

## 2019-11-27 LAB — CBC WITH DIFFERENTIAL/PLATELET
Basophils Absolute: 0 10*3/uL (ref 0.0–0.2)
Basos: 0 %
EOS (ABSOLUTE): 0.1 10*3/uL (ref 0.0–0.4)
Eos: 2 %
Hematocrit: 48.4 % (ref 37.5–51.0)
Hemoglobin: 16 g/dL (ref 13.0–17.7)
Immature Grans (Abs): 0 10*3/uL (ref 0.0–0.1)
Immature Granulocytes: 0 %
Lymphocytes Absolute: 0.9 10*3/uL (ref 0.7–3.1)
Lymphs: 24 %
MCH: 28.6 pg (ref 26.6–33.0)
MCHC: 33.1 g/dL (ref 31.5–35.7)
MCV: 87 fL (ref 79–97)
Monocytes Absolute: 0.5 10*3/uL (ref 0.1–0.9)
Monocytes: 13 %
Neutrophils Absolute: 2.2 10*3/uL (ref 1.4–7.0)
Neutrophils: 61 %
Platelets: 44 10*3/uL — CL (ref 150–450)
RBC: 5.59 x10E6/uL (ref 4.14–5.80)
RDW: 14.7 % (ref 11.6–15.4)
WBC: 3.6 10*3/uL (ref 3.4–10.8)

## 2019-11-27 LAB — LIPID PANEL
Chol/HDL Ratio: 3.4 ratio (ref 0.0–5.0)
Cholesterol, Total: 157 mg/dL (ref 100–199)
HDL: 46 mg/dL (ref >39)
LDL Chol Calc (NIH): 93 mg/dL (ref 0–99)
Triglycerides: 100 mg/dL (ref 0–149)
VLDL Cholesterol Cal: 18 mg/dL (ref 5–40)

## 2019-11-27 LAB — PSA: Prostate Specific Ag, Serum: 0.7 ng/mL (ref 0.0–4.0)

## 2019-11-27 LAB — VITAMIN D 25 HYDROXY (VIT D DEFICIENCY, FRACTURES): Vit D, 25-Hydroxy: 9.7 ng/mL — ABNORMAL LOW (ref 30.0–100.0)

## 2019-11-27 LAB — TSH: TSH: 3.36 u[IU]/mL (ref 0.450–4.500)

## 2019-11-27 LAB — VITAMIN B12: Vitamin B-12: 359 pg/mL (ref 232–1245)

## 2019-11-28 DIAGNOSIS — G629 Polyneuropathy, unspecified: Secondary | ICD-10-CM | POA: Insufficient documentation

## 2019-11-28 DIAGNOSIS — F419 Anxiety disorder, unspecified: Secondary | ICD-10-CM | POA: Insufficient documentation

## 2019-11-28 DIAGNOSIS — R062 Wheezing: Secondary | ICD-10-CM | POA: Insufficient documentation

## 2019-12-08 ENCOUNTER — Other Ambulatory Visit: Payer: Self-pay | Admitting: Family Medicine

## 2019-12-08 DIAGNOSIS — K7031 Alcoholic cirrhosis of liver with ascites: Secondary | ICD-10-CM

## 2020-01-17 ENCOUNTER — Other Ambulatory Visit: Payer: Self-pay | Admitting: Interventional Radiology

## 2020-01-17 DIAGNOSIS — C220 Liver cell carcinoma: Secondary | ICD-10-CM

## 2020-01-20 ENCOUNTER — Other Ambulatory Visit: Payer: Self-pay | Admitting: *Deleted

## 2020-01-20 DIAGNOSIS — C220 Liver cell carcinoma: Secondary | ICD-10-CM

## 2020-01-24 ENCOUNTER — Telehealth: Payer: Self-pay | Admitting: Family Medicine

## 2020-01-24 NOTE — Telephone Encounter (Signed)
Called, no answer. Left a message to call back.  

## 2020-01-25 LAB — CBC
HCT: 48.1 % (ref 38.5–50.0)
Hemoglobin: 16.1 g/dL (ref 13.2–17.1)
MCH: 29.2 pg (ref 27.0–33.0)
MCHC: 33.5 g/dL (ref 32.0–36.0)
MCV: 87.3 fL (ref 80.0–100.0)
Platelets: 58 10*3/uL — ABNORMAL LOW (ref 140–400)
RBC: 5.51 10*6/uL (ref 4.20–5.80)
RDW: 14.4 % (ref 11.0–15.0)
WBC: 3.9 10*3/uL (ref 3.8–10.8)

## 2020-01-25 LAB — AFP TUMOR MARKER: AFP-Tumor Marker: 820.3 ng/mL — ABNORMAL HIGH (ref <6.1)

## 2020-01-25 LAB — COMPLETE METABOLIC PANEL WITH GFR
AG Ratio: 1.7 (calc) (ref 1.0–2.5)
ALT: 19 U/L (ref 9–46)
AST: 22 U/L (ref 10–35)
Albumin: 4.6 g/dL (ref 3.6–5.1)
Alkaline phosphatase (APISO): 116 U/L (ref 35–144)
BUN: 11 mg/dL (ref 7–25)
CO2: 29 mmol/L (ref 20–32)
Calcium: 9.3 mg/dL (ref 8.6–10.3)
Chloride: 99 mmol/L (ref 98–110)
Creat: 1.02 mg/dL (ref 0.70–1.25)
GFR, Est African American: 90 mL/min/{1.73_m2} (ref >=60)
GFR, Est Non African American: 77 mL/min/{1.73_m2} (ref >=60)
Globulin: 2.7 g/dL (calc) (ref 1.9–3.7)
Glucose, Bld: 108 mg/dL (ref 65–139)
Potassium: 4.4 mmol/L (ref 3.5–5.3)
Sodium: 137 mmol/L (ref 135–146)
Total Bilirubin: 1.2 mg/dL (ref 0.2–1.2)
Total Protein: 7.3 g/dL (ref 6.1–8.1)

## 2020-01-25 LAB — PROTIME-INR
INR: 1.1
Prothrombin Time: 11.7 s — ABNORMAL HIGH (ref 9.0–11.5)

## 2020-02-01 ENCOUNTER — Other Ambulatory Visit: Payer: Self-pay

## 2020-02-01 ENCOUNTER — Ambulatory Visit (HOSPITAL_COMMUNITY)
Admission: RE | Admit: 2020-02-01 | Discharge: 2020-02-01 | Disposition: A | Discharge status: Home / Self Care | Payer: Medicaid Other | Source: Ambulatory Visit | Attending: Interventional Radiology | Admitting: Interventional Radiology

## 2020-02-01 DIAGNOSIS — C220 Liver cell carcinoma: Secondary | ICD-10-CM

## 2020-02-01 MED ORDER — GADOBUTROL 1 MMOL/ML IV SOLN
8.0000 mL | Freq: Once | INTRAVENOUS | Status: AC | PRN
  Administered 2020-02-01: 8 mL via INTRAVENOUS

## 2020-02-10 ENCOUNTER — Other Ambulatory Visit: Payer: Self-pay

## 2020-02-10 ENCOUNTER — Ambulatory Visit
Admission: RE | Admit: 2020-02-10 | Discharge: 2020-02-10 | Disposition: A | Discharge status: Home / Self Care | Payer: Medicaid Other | Source: Ambulatory Visit | Attending: Interventional Radiology | Admitting: Interventional Radiology

## 2020-02-10 ENCOUNTER — Encounter: Payer: Self-pay | Admitting: *Deleted

## 2020-02-10 DIAGNOSIS — C220 Liver cell carcinoma: Secondary | ICD-10-CM

## 2020-02-10 HISTORY — PX: IR RADIOLOGIST EVAL & MGMT: IMG5224

## 2020-02-10 NOTE — Progress Notes (Signed)
Chief Complaint: Patient was seen in follow-up remotely today ([place]) for hepatocellular carcinoma at the request of [place].    Referring Physician(s): Dr. Jana Hakim  History of Present Illness: KEDRIC SETTERS is a 64 y.o. male with alcoholic and HCV cirrhosis complicated by hepatocellular carcinoma, ascites and esophageal varices. He was treated by combination drug-eluting bead transarterial chemoembolization followed by microwave thermal ablation on 6/25 and 06/10/2014.  Follow-up surveillance imaging has demonstrated a complete response to therapy to this point.He presents again today to IR clinic for follow-up MRI and routine evaluation. He continues to do well and is currently without new complaints.  He remains asymptomatic.  Denies abdominal pain, nausea, vomiting, fatigue, unintentional weight loss or other systemic symptoms.   Unfortunately, His most recent laboratory values are distressing.  His alpha-fetoprotein spiked to 820.3 compared to 132 last year.   His most recent laboratory values are distressing.  His alpha-fetoprotein spiked to 820.3 compared to 132 last year.  His most recent MRI dated 02/01/2020 demonstrates significant interval enlargement of previously noted LR-3 lesions.  He also has at least 2 new lesions.  He has at least 4 lesions, 3 in the right hemiliver and one in the left.  His largest individual lesion measures up to 4.4 cm in segment 7 of the hepatic dome.  I reviewed his imaging findings with him in detail.  Past Medical History:  Diagnosis Date  . Abnormal MRI of abdomen, liver  04/21/2014  . Anxiety   . Cirrhosis, alcoholic ([place]) [id number]  . Claudication of left lower extremity (HCC) m-1  . Depression   . Girard (hepatocellular carcinoma) ([place])   . Hepatitis C   . Hypertension   . Liver mass   . Panic attack     Past Surgical History:  Procedure Laterality Date  . ABDOMINAL AORTAGRAM N/A 12/27/2013   Procedure: ABDOMINAL  Maxcine Ham;  Surgeon: Conrad West Orange, MD;  Location: [facility] CATH LAB;  Service: Cardiovascular;  Laterality: N/A;  . AORTOGRAM    . COSMETIC SURGERY     facial reconstructive surgery  . ESOPHAGOGASTRODUODENOSCOPY N/A 12/17/2014   Procedure: ESOPHAGOGASTRODUODENOSCOPY (EGD);  Surgeon: Missy Sabins, MD;  Location: [facility];  Service: Endoscopy;  Laterality: N/A;  . ESOPHAGOGASTRODUODENOSCOPY N/A 09/05/2015   Procedure: ESOPHAGOGASTRODUODENOSCOPY (EGD);  Surgeon: Wonda Horner, MD;  Location: [facility];  Service: Endoscopy;  Laterality: N/A;  . IR GENERIC HISTORICAL  01/19/2015   IR RADIOLOGIST EVAL & MGMT 01/19/2015 Jacqulynn Cadet, MD [facility] INTERV RAD  . IR GENERIC HISTORICAL  07/23/2016   IR RADIOLOGIST EVAL & MGMT 07/23/2016 Jacqulynn Cadet, MD [facility] INTERV RAD  . IR GENERIC HISTORICAL  05/30/2015   IR RADIOLOGIST EVAL & MGMT 05/30/2015 [facility] INTERV RAD  . IR GENERIC HISTORICAL  10/04/2014   IR RADIOLOGIST EVAL & MGMT 10/04/2014 Jacqulynn Cadet, MD [facility] INTERV RAD  . IR GENERIC HISTORICAL  02/11/2017   IR RADIOLOGIST EVAL & MGMT 02/11/2017 Jacqulynn Cadet, MD [facility] INTERV RAD  . IR RADIOLOGIST EVAL & MGMT  08/13/2017  . IR RADIOLOGIST EVAL & MGMT  08/11/2018  . IR RADIOLOGIST EVAL & MGMT  02/09/2019  . LOWER EXTREMITY ANGIOGRAM Bilateral 12/27/2013   Procedure: LOWER EXTREMITY ANGIOGRAM;  Surgeon: Conrad , MD;  Location: [facility] CATH LAB;  Service: Cardiovascular;  Laterality: Bilateral;    Allergies: Folic acid and Oxycodone  Medications: Prior to Admission medications   Medication Sig Start Date End Date Taking? Authorizing Provider  albuterol (VENTOLIN HFA) 108 (90 Base) MCG/ACT  inhaler Inhale 2 puffs into the lungs every 6 (six) hours as needed for wheezing or shortness of breath. 11/26/19   Azzie Glatter, FNP  ALPRAZolam Duanne Moron) 0.5 MG tablet Take 0.5 mg by mouth 2 (two) times daily.     [provider]  bisacodyl (DULCOLAX) 5 MG EC tablet Take 5 mg by mouth daily as needed  for mild constipation or moderate constipation. Reported on 04/03/2016    [provider]  FEROSUL 325 (65 Fe) MG tablet TAKE 1 TABLET(325 MG) BY MOUTH DAILY WITH BREAKFAST Patient not taking: Reported on 06/17/2018 11/10/17   Scot Jun, FNP  FLUoxetine (PROZAC) 40 MG capsule Take 1 capsule (40 mg total) by mouth every morning. 11/26/19   Azzie Glatter, FNP  furosemide (LASIX) 20 MG tablet TAKE 1 TABLET(20 MG) BY MOUTH DAILY 11/29/19   Azzie Glatter, FNP  furosemide (LASIX) 20 MG tablet TAKE 1 TABLET BY MOUTH EVERY MORNING 11/26/19   Azzie Glatter, FNP  gabapentin (NEURONTIN) 100 MG capsule Take 1 capsule, by mouth 3 times a day. 11/26/19   Azzie Glatter, FNP  hydrOXYzine (ATARAX/VISTARIL) 10 MG tablet Take 1 tablet (10 mg total) by mouth 3 (three) times daily as needed. 11/26/19   Azzie Glatter, FNP  pantoprazole (PROTONIX) 40 MG tablet TAKE 1 TABLET(40 MG) BY MOUTH TWICE DAILY BEFORE A MEAL 11/26/19   Azzie Glatter, FNP  spironolactone (ALDACTONE) 25 MG tablet Take 3 tablets (75 mg total) by mouth daily. 11/26/19   Azzie Glatter, FNP  vitamin C (ASCORBIC ACID) 250 MG tablet Take 500 mg by mouth daily.    [provider]     Family History  Problem Relation Age of Onset  . Diabetes Father   . Diabetes Brother   . Diabetes Brother     Social History   Socioeconomic History  . Marital status: Single    Spouse name: Not on file  . Number of children: 3  . Years of education: Not on file  . Highest education level: Not on file  Occupational History    Employer: [employer]  Tobacco Use  . Smoking status: Former Smoker    Packs/day: 0.50    Quit date: 08/21/2011    Years since quitting: 8.4  . Smokeless tobacco: Never Used  Substance and Sexual Activity  . Alcohol use: No    Alcohol/week: 0.0 standard drinks    Comment: Occasional beer on the weekends  . Drug use: No    Types: Cocaine    Comment: abused cocaine in the 80's  .  Sexual activity: Not Currently  Other Topics Concern  . Not on file  Social History Narrative   Single, never married.  Lives alone.  3 daughters, 16 grandchildren who are not very involved in the patient's care.  Quit smoking and heavy drinking 3.5 years ago.    Social Determinants of Health   Financial Resource Strain:   . Difficulty of Paying Living Expenses: Not on file  Food Insecurity:   . Worried About Charity fundraiser in the Last Year: Not on file  . Ran Out of Food in the Last Year: Not on file  Transportation Needs:   . Lack of Transportation (Medical): Not on file  . Lack of Transportation (Non-Medical): Not on file  Physical Activity:   . Days of Exercise per Week: Not on file  . Minutes of Exercise per Session: Not on file  Stress:   .  Feeling of Stress : Not on file  Social Connections:   . Frequency of Communication with Friends and Family: Not on file  . Frequency of Social Gatherings with Friends and Family: Not on file  . Attends Religious Services: Not on file  . Active Member of Clubs or Organizations: Not on file  . Attends Archivist Meetings: Not on file  . Marital Status: Not on file    Review of Systems  Review of Systems: A 12 point ROS discussed and pertinent positives are indicated in the HPI above.  All other systems are negative.  Physical Exam No direct physical exam was performed (except for noted visual exam findings with Video Visits).   Vital Signs: There were no vitals taken for this visit.  Imaging: MR ABDOMEN WWO CONTRAST  Result Date: 02/01/2020 CLINICAL DATA:  Hepatocellular carcinoma status post DEB-TACE 06/09/2014 and microwave ablation 06/10/2014. Restaging. EXAM: MRI ABDOMEN WITHOUT AND WITH CONTRAST TECHNIQUE: Multiplanar multisequence MR imaging of the abdomen was performed both before and after the administration of intravenous contrast. CONTRAST:  49mL GADAVIST GADOBUTROL 1 MMOL/ML IV SOLN COMPARISON:  02/09/2019  MRI abdomen. FINDINGS: Lower chest: No acute abnormality at the lung bases. Hepatobiliary: Diffusely lobulated liver surface with relative hypertrophy of the lateral segment left liver lobe, compatible with hepatic cirrhosis. No hepatic steatosis. Segment 7 right liver dome 4.4 x 2.9 cm mass (series 10/image 25) with avid arterial hyperenhancement and restricted diffusion, significantly increased from 1.4 x 1.1 cm on 02/09/2019 MRI compatible with LI-RADS category LR-5. New 2.2 x 1.9 cm focus of arterial hyperenhancement at the margin of the previous ablation defect in the segment 7 right liver lobe (series 10/image 31), contiguous with the above segment 7 lesion. Segment 6 right liver lobe 2.8 x 2.5 cm mass (series 10/image 44) with avid arterial hyperenhancement, areas of portal washout and restricted diffusion, increased from 1.7 x 1.3 cm on 02/09/2019 MRI, compatible with LI-RADS category LR-5. New posterior segment 2 left liver lobe 1.3 x 1.3 cm mass (series 10/image 28) with arterial hyperenhancement and restricted diffusion, compatible with LI-RADS category LR-5. Multiple subcentimeter gallstones within the nondistended gallbladder. No gallbladder wall thickening or pericholecystic fluid. No biliary ductal dilatation. Common bile duct diameter 3 mm. No choledocholithiasis. No biliary strictures, masses or beading. Pancreas: No pancreatic mass or duct dilation.  No pancreas divisum. Spleen: Mild to moderate splenomegaly. Craniocaudal splenic length 17.2 cm. No splenic mass. Adrenals/Urinary Tract: Normal adrenals. No hydronephrosis. Normal kidneys with no renal mass. Stomach/Bowel: Normal non-distended stomach. Visualized small and large bowel is normal caliber, with no bowel wall thickening. Vascular/Lymphatic: Atherosclerotic nonaneurysmal abdominal aorta. Patent portal, splenic, hepatic and renal veins. Small paraumbilical and perisplenic varices. No pathologically enlarged lymph nodes in the abdomen.  Other: No abdominal ascites or focal fluid collection. Musculoskeletal: No aggressive appearing focal osseous lesions. IMPRESSION: 1. Recurrent multifocal hepatocellular carcinoma with significant growth of segment 7 right liver dome tumor, new tumor at the previous ablation defect in segment 7, enlarging segment 6 tumor and new segment 2 tumor. 2. Cirrhosis. Mild-to-moderate splenomegaly. Small paraumbilical and perisplenic varices. No ascites. 3. Cholelithiasis.  No biliary ductal dilatation. 4.  Aortic Atherosclerosis (ICD10-I70.0). Electronically Signed   By: Ilona Sorrel M.D.   On: 02/01/2020 12:52    Labs:  CBC: Recent Labs    11/26/19 1213 01/24/20 1230  WBC 3.6 3.9  HGB 16.0 16.1  HCT 48.4 48.1  PLT 44* 58*    COAGS: Recent Labs  01/24/20 1230  INR 1.1    BMP: Recent Labs    01/24/20 1230  NA 137  K 4.4  CL 99  CO2 29  GLUCOSE 108  BUN 11  CALCIUM 9.3  CREATININE 1.02  GFRNONAA 77  GFRAA 90    LIVER FUNCTION TESTS: Recent Labs    01/24/20 1230  BILITOT 1.2  AST 22  ALT 19  PROT 7.3    TUMOR MARKERS: Recent Labs    01/24/20 1230  AFPTM 820.3*    Assessment and Plan:  Unfortunately, Mr. Deleo his most recent MRI demonstrates significant interval progression of disease with new multifocal hepatocellular carcinoma.  He now has disease in both the right and left liver including hepatic segments 7, 6 and 2.  His largest lesion is now over 4 cm and he has more than 3 lesions.  This effectively places him outside of the [place] criteria and excludes him from possible liver transplantation.  Mr. Parrado is understandably distressed by this information.  He watched one of his brothers pass away from hepatocellular carcinoma.  I explained to him that while we were able to achieve remission for nearly 6 years, his disease has returned.  At this point, given the multi focal nature of his disease, we are unlikely to achieve remission again.  Future treatments  will be more palliative in nature in an effort to provide him with the most time and best quality of life possible.  At this point, I believe he is an excellent candidate for additional liver directed therapy with Y 90 transarterial radio embolization.  I will also make sure he reconnects with Dr. Burr Medico as systemic therapy will likely be needed in the future.  1.)  Schedule for pre-Y 11 planning study to be followed by right lobar and left segmental transarterial radio embolization.    Electronically Signed: Jacqulynn Cadet 02/10/2020, 2:14 PM   I spent a total of  25 Minutes in remote  clinical consultation, greater than 50% of which was counseling/coordinating care for hepatocellular cancer.    Visit type: Audio only (telephone). Audio (no video) only due to patient preference.. Alternative for in-person consultation at [facility], [address] [place], [place], [place]. This visit type was conducted due to national recommendations for restrictions regarding the COVID-19 Pandemic (e.g. social distancing).  This format is felt to be most appropriate for this patient at this time.  All issues noted in this document were discussed and addressed.

## 2020-02-22 ENCOUNTER — Other Ambulatory Visit (HOSPITAL_COMMUNITY): Payer: Self-pay | Admitting: Interventional Radiology

## 2020-02-22 DIAGNOSIS — C220 Liver cell carcinoma: Secondary | ICD-10-CM

## 2020-03-01 ENCOUNTER — Other Ambulatory Visit: Payer: Self-pay | Admitting: Family Medicine

## 2020-03-09 ENCOUNTER — Other Ambulatory Visit: Payer: Self-pay | Admitting: Student

## 2020-03-09 ENCOUNTER — Other Ambulatory Visit: Payer: Self-pay | Admitting: Radiology

## 2020-03-10 ENCOUNTER — Ambulatory Visit (HOSPITAL_COMMUNITY)
Admission: RE | Admit: 2020-03-10 | Discharge: 2020-03-10 | Disposition: A | Discharge status: Home / Self Care | Payer: Medicare Other | Source: Ambulatory Visit | Attending: Interventional Radiology | Admitting: Interventional Radiology

## 2020-03-10 ENCOUNTER — Other Ambulatory Visit (HOSPITAL_COMMUNITY): Payer: Self-pay | Admitting: Interventional Radiology

## 2020-03-10 ENCOUNTER — Encounter (HOSPITAL_COMMUNITY)
Admission: RE | Admit: 2020-03-10 | Discharge: 2020-03-10 | Disposition: A | Discharge status: Home / Self Care | Payer: Medicare Other | Source: Ambulatory Visit | Attending: Interventional Radiology | Admitting: Interventional Radiology

## 2020-03-10 ENCOUNTER — Encounter (HOSPITAL_COMMUNITY): Payer: Self-pay

## 2020-03-10 ENCOUNTER — Other Ambulatory Visit: Payer: Self-pay

## 2020-03-10 DIAGNOSIS — C220 Liver cell carcinoma: Secondary | ICD-10-CM | POA: Diagnosis present

## 2020-03-10 DIAGNOSIS — F329 Major depressive disorder, single episode, unspecified: Secondary | ICD-10-CM | POA: Insufficient documentation

## 2020-03-10 DIAGNOSIS — I10 Essential (primary) hypertension: Secondary | ICD-10-CM | POA: Insufficient documentation

## 2020-03-10 DIAGNOSIS — K7031 Alcoholic cirrhosis of liver with ascites: Secondary | ICD-10-CM | POA: Diagnosis not present

## 2020-03-10 DIAGNOSIS — F419 Anxiety disorder, unspecified: Secondary | ICD-10-CM | POA: Insufficient documentation

## 2020-03-10 DIAGNOSIS — Z79899 Other long term (current) drug therapy: Secondary | ICD-10-CM | POA: Diagnosis not present

## 2020-03-10 DIAGNOSIS — I739 Peripheral vascular disease, unspecified: Secondary | ICD-10-CM | POA: Diagnosis not present

## 2020-03-10 HISTORY — PX: IR ANGIOGRAM VISCERAL SELECTIVE: IMG657

## 2020-03-10 HISTORY — PX: IR EMBO ARTERIAL NOT HEMORR HEMANG INC GUIDE ROADMAPPING: IMG5448

## 2020-03-10 HISTORY — PX: IR US GUIDE VASC ACCESS LEFT: IMG2389

## 2020-03-10 HISTORY — PX: IR ANGIOGRAM SELECTIVE EACH ADDITIONAL VESSEL: IMG667

## 2020-03-10 LAB — CBC WITH DIFFERENTIAL/PLATELET
Abs Immature Granulocytes: 0 K/uL (ref 0.00–0.07)
Basophils Absolute: 0 K/uL (ref 0.0–0.1)
Basophils Relative: 1 %
Eosinophils Absolute: 0.1 K/uL (ref 0.0–0.5)
Eosinophils Relative: 2 %
HCT: 47.5 % (ref 39.0–52.0)
Hemoglobin: 15.7 g/dL (ref 13.0–17.0)
Immature Granulocytes: 0 %
Lymphocytes Relative: 23 %
Lymphs Abs: 0.8 K/uL (ref 0.7–4.0)
MCH: 29.1 pg (ref 26.0–34.0)
MCHC: 33.1 g/dL (ref 30.0–36.0)
MCV: 88.1 fL (ref 80.0–100.0)
Monocytes Absolute: 0.7 K/uL (ref 0.1–1.0)
Monocytes Relative: 21 %
Neutro Abs: 1.8 K/uL (ref 1.7–7.7)
Neutrophils Relative %: 53 %
Platelets: DECREASED K/uL (ref 150–400)
RBC: 5.39 MIL/uL (ref 4.22–5.81)
RDW: 14.7 % (ref 11.5–15.5)
WBC: 3.3 K/uL — ABNORMAL LOW (ref 4.0–10.5)
nRBC: 0 % (ref 0.0–0.2)

## 2020-03-10 LAB — COMPREHENSIVE METABOLIC PANEL WITH GFR
ALT: 18 U/L (ref 0–44)
AST: 23 U/L (ref 15–41)
Albumin: 4 g/dL (ref 3.5–5.0)
Alkaline Phosphatase: 77 U/L (ref 38–126)
Anion gap: 10 (ref 5–15)
BUN: 13 mg/dL (ref 8–23)
CO2: 22 mmol/L (ref 22–32)
Calcium: 8.6 mg/dL — ABNORMAL LOW (ref 8.9–10.3)
Chloride: 101 mmol/L (ref 98–111)
Creatinine, Ser: 1.04 mg/dL (ref 0.61–1.24)
GFR calc Af Amer: >60 mL/min
GFR calc non Af Amer: >60 mL/min
Glucose, Bld: 103 mg/dL — ABNORMAL HIGH (ref 70–99)
Potassium: 3.2 mmol/L — ABNORMAL LOW (ref 3.5–5.1)
Sodium: 133 mmol/L — ABNORMAL LOW (ref 135–145)
Total Bilirubin: 1.2 mg/dL (ref 0.3–1.2)
Total Protein: 7.1 g/dL (ref 6.5–8.1)

## 2020-03-10 LAB — PROTIME-INR
INR: 1.2 (ref 0.8–1.2)
Prothrombin Time: 15.1 seconds (ref 11.4–15.2)

## 2020-03-10 MED ORDER — VERAPAMIL HCL 2.5 MG/ML IV SOLN
INTRAVENOUS | Status: AC
  Filled 2020-03-10: qty 2

## 2020-03-10 MED ORDER — MIDAZOLAM HCL 2 MG/2ML IJ SOLN
INTRAMUSCULAR | Status: AC
  Filled 2020-03-10: qty 2

## 2020-03-10 MED ORDER — IOHEXOL 300 MG/ML  SOLN
100.0000 mL | Freq: Once | INTRAMUSCULAR | Status: AC | PRN
  Administered 2020-03-10: 49 mL via INTRA_ARTERIAL

## 2020-03-10 MED ORDER — FLUMAZENIL 0.5 MG/5ML IV SOLN
INTRAVENOUS | Status: AC
  Filled 2020-03-10: qty 5

## 2020-03-10 MED ORDER — VERAPAMIL HCL 2.5 MG/ML IV SOLN: INTRA_ARTERIAL | Status: AC | PRN

## 2020-03-10 MED ORDER — MIDAZOLAM HCL 2 MG/2ML IJ SOLN
INTRAMUSCULAR | Status: AC | PRN
  Administered 2020-03-10 (×2): 1 mg via INTRAVENOUS
  Administered 2020-03-10: 2 mg via INTRAVENOUS
  Administered 2020-03-10 (×4): 1 mg via INTRAVENOUS

## 2020-03-10 MED ORDER — TECHNETIUM TO 99M ALBUMIN AGGREGATED
4.4000 | Freq: Once | INTRAVENOUS | Status: AC | PRN
  Administered 2020-03-10: 4.4 via INTRAVENOUS

## 2020-03-10 MED ORDER — FENTANYL CITRATE (PF) 100 MCG/2ML IJ SOLN
INTRAMUSCULAR | Status: AC | PRN
  Administered 2020-03-10 (×6): 50 ug via INTRAVENOUS

## 2020-03-10 MED ORDER — MIDAZOLAM HCL 2 MG/2ML IJ SOLN
INTRAMUSCULAR | Status: AC
  Filled 2020-03-10: qty 6

## 2020-03-10 MED ORDER — IOHEXOL 300 MG/ML  SOLN
100.0000 mL | Freq: Once | INTRAMUSCULAR | Status: AC | PRN
  Administered 2020-03-10: 60 mL via INTRA_ARTERIAL

## 2020-03-10 MED ORDER — HEPARIN SODIUM (PORCINE) 1000 UNIT/ML IJ SOLN
INTRAMUSCULAR | Status: AC
  Filled 2020-03-10: qty 1

## 2020-03-10 MED ORDER — LIDOCAINE HCL (PF) 1 % IJ SOLN
INTRAMUSCULAR | Status: AC | PRN
  Administered 2020-03-10: 2 mL

## 2020-03-10 MED ORDER — SODIUM CHLORIDE 0.9 % IV SOLN: INTRAVENOUS | Status: DC

## 2020-03-10 MED ORDER — FENTANYL CITRATE (PF) 100 MCG/2ML IJ SOLN
INTRAMUSCULAR | Status: AC
  Filled 2020-03-10: qty 6

## 2020-03-10 MED ORDER — LIDOCAINE HCL 1 % IJ SOLN
INTRAMUSCULAR | Status: AC
  Filled 2020-03-10: qty 20

## 2020-03-10 MED ORDER — NALOXONE HCL 0.4 MG/ML IJ SOLN
INTRAMUSCULAR | Status: AC
  Filled 2020-03-10: qty 1

## 2020-03-10 MED ORDER — NITROGLYCERIN IN D5W 100-5 MCG/ML-% IV SOLN
INTRAVENOUS | Status: AC
  Filled 2020-03-10: qty 250

## 2020-03-10 NOTE — Discharge Instructions (Addendum)
 Radial Site Care  This sheet gives you information about how to care for yourself after your procedure. Your health care provider may also give you more specific instructions. If you have problems or questions, contact your health care provider. What can I expect after the procedure? After the procedure, it is common to have:  Bruising and tenderness at the catheter insertion area. Follow these instructions at home: Medicines  Take over-the-counter and prescription medicines only as told by your health care provider. Insertion site care  Follow instructions from your health care provider about how to take care of your insertion site. Make sure you: ? Wash your hands with soap and water before you change your bandage (dressing). If soap and water are not available, use hand sanitizer. ? Change your dressing as told by your health care provider. ? Leave stitches (sutures), skin glue, or adhesive strips in place. These skin closures may need to stay in place for 2 weeks or longer. If adhesive strip edges start to loosen and curl up, you may trim the loose edges. Do not remove adhesive strips completely unless your health care provider tells you to do that.  Check your insertion site every day for signs of infection. Check for: ? Redness, swelling, or pain. ? Fluid or blood. ? Pus or a bad smell. ? Warmth.  Do not take baths, swim, or use a hot tub until your health care provider approves.  You may shower 24-48 hours after the procedure, or as directed by your health care provider. ? Remove the dressing and gently wash the site with plain soap and water. ? Pat the area dry with a clean towel. ? Do not rub the site. That could cause bleeding.  Do not apply powder or lotion to the site. Activity   For 24 hours after the procedure, or as directed by your health care provider: ? Do not flex or bend the affected arm. ? Do not push or pull heavy objects with the affected arm. ? Do not  drive yourself home from the hospital or clinic. You may drive 24 hours after the procedure unless your health care provider tells you not to. ? Do not operate machinery or power tools.  Do not lift anything that is heavier than 10 lb (4.5 kg), or the limit that you are told, until your health care provider says that it is safe.  Ask your health care provider when it is okay to: ? Return to work or school. ? Resume usual physical activities or sports. ? Resume sexual activity. General instructions  If the catheter site starts to bleed, raise your arm and put firm pressure on the site. If the bleeding does not stop, get help right away. This is a medical emergency.  If you went home on the same day as your procedure, a responsible adult should be with you for the first 24 hours after you arrive home.  Keep all follow-up visits as told by your health care provider. This is important. Contact a health care provider if:  You have a fever.  You have redness, swelling, or yellow drainage around your insertion site. Get help right away if:  You have unusual pain at the radial site.  The catheter insertion area swells very fast.  The insertion area is bleeding, and the bleeding does not stop when you hold steady pressure on the area.  Your arm or hand becomes pale, cool, tingly, or numb. These symptoms may represent a serious   problem that is an emergency. Do not wait to see if the symptoms will go away. Get medical help right away. Call your local emergency services (911 in the [place] Do not drive yourself to the hospital. Summary  After the procedure, it is common to have bruising and tenderness at the site.  Follow instructions from your health care provider about how to take care of your radial site wound. Check the wound every day for signs of infection.  Do not lift anything that is heavier than 10 lb (4.5 kg), or the limit that you are told, until your health care provider says  that it is safe. This information is not intended to replace advice given to you by your health care provider. Make sure you discuss any questions you have with your health care provider. Document Revised: 01/07/2018 Document Reviewed: 01/07/2018 Elsevier Patient Education  2020 [employer]        Moderate Conscious Sedation, Adult, Care After These instructions provide you with information about caring for yourself after your procedure. Your health care provider may also give you more specific instructions. Your treatment has been planned according to current medical practices, but problems sometimes occur. Call your health care provider if you have any problems or questions after your procedure. What can I expect after the procedure? After your procedure, it is common:  To feel sleepy for several hours.  To feel clumsy and have poor balance for several hours.  To have poor judgment for several hours.  To vomit if you eat too soon. Follow these instructions at home: For at least 24 hours after the procedure:   Do not: ? Participate in activities where you could fall or become injured. ? Drive. ? Use heavy machinery. ? Drink alcohol. ? Take sleeping pills or medicines that cause drowsiness. ? Make important decisions or sign legal documents. ? Take care of children on your own.  Rest. Eating and drinking  Follow the diet recommended by your health care provider.  If you vomit: ? Drink water, juice, or soup when you can drink without vomiting. ? Make sure you have little or no nausea before eating solid foods. General instructions  Have a responsible adult stay with you until you are awake and alert.  Take over-the-counter and prescription medicines only as told by your health care provider.  If you smoke, do not smoke without supervision.  Keep all follow-up visits as told by your health care provider. This is important. Contact a health care provider  if:  You keep feeling nauseous or you keep vomiting.  You feel light-headed.  You develop a rash.  You have a fever. Get help right away if:  You have trouble breathing. This information is not intended to replace advice given to you by your health care provider. Make sure you discuss any questions you have with your health care provider. Document Revised: 11/14/2017 Document Reviewed: 03/23/2016 Elsevier Patient Education  2020 [employer] Inc.  

## 2020-03-10 NOTE — H&P (Signed)
Referring Physician(s): Magrinat,G/[name],D  Supervising Physician: Jacqulynn Cadet  Patient Status:  [facility]  Chief Complaint: Progressive multifocal hepatocellular carcinoma   Subjective:  Jeffrey Snow is a 64 y.o. male withalcoholic and HCV cirrhosis complicated by hepatocellular carcinoma, ascites and esophageal varices. He was treated by combination drug-eluting bead transarterial chemoembolization followed by microwave thermal ablation on 6/25 and 06/10/2014. His most recent MRI demonstrates significant interval progression of disease with new multifocal hepatocellular carcinoma.  He now has disease in both the right and left liver including hepatic segments 7, 6 and 2.  His largest lesion is now over 4 cm and he has more than 3 lesions.  This effectively places him outside of the [place] criteria and excludes him from possible liver transplantation. His latest AFP in February of this year was 132.3.  Following recent discussions with Dr. Laurence Ferrari he was deemed an appropriate candidate for hepatic Y 90 treatment and presents today for preplanning hepatic/visceral arteriography with embolization and test Y 90 dosing.  He currently denies fever, headache, chest pain, dyspnea, cough, abdominal/back pain, nausea, vomiting or bleeding.  Past Medical History:  Diagnosis Date  . Abnormal MRI of abdomen, liver  04/21/2014  . Anxiety   . Cirrhosis, alcoholic ([place]) 123456  . Claudication of left lower extremity (HCC) m-1  . Depression   . [place] (hepatocellular carcinoma) ([place])   . Hepatitis C   . Hypertension   . Liver mass   . Panic attack    Past Surgical History:  Procedure Laterality Date  . ABDOMINAL AORTAGRAM N/A 12/27/2013   Procedure: ABDOMINAL Maxcine Ham;  Surgeon: Conrad Fullerton, MD;  Location: [facility] CATH LAB;  Service: Cardiovascular;  Laterality: N/A;  . AORTOGRAM    . COSMETIC SURGERY     facial reconstructive surgery  . ESOPHAGOGASTRODUODENOSCOPY N/A 12/17/2014   Procedure: ESOPHAGOGASTRODUODENOSCOPY (EGD);  Surgeon: Missy Sabins, MD;  Location: [facility];  Service: Endoscopy;  Laterality: N/A;  . ESOPHAGOGASTRODUODENOSCOPY N/A 09/05/2015   Procedure: ESOPHAGOGASTRODUODENOSCOPY (EGD);  Surgeon: Wonda Horner, MD;  Location: [facility];  Service: Endoscopy;  Laterality: N/A;  . IR GENERIC HISTORICAL  01/19/2015   IR RADIOLOGIST EVAL & MGMT 01/19/2015 Jacqulynn Cadet, MD [facility] INTERV RAD  . IR GENERIC HISTORICAL  07/23/2016   IR RADIOLOGIST EVAL & MGMT 07/23/2016 Jacqulynn Cadet, MD [facility] INTERV RAD  . IR GENERIC HISTORICAL  05/30/2015   IR RADIOLOGIST EVAL & MGMT 05/30/2015 [facility] INTERV RAD  . IR GENERIC HISTORICAL  10/04/2014   IR RADIOLOGIST EVAL & MGMT 10/04/2014 Jacqulynn Cadet, MD [facility] INTERV RAD  . IR GENERIC HISTORICAL  02/11/2017   IR RADIOLOGIST EVAL & MGMT 02/11/2017 Jacqulynn Cadet, MD [facility] INTERV RAD  . IR RADIOLOGIST EVAL & MGMT  08/13/2017  . IR RADIOLOGIST EVAL & MGMT  08/11/2018  . IR RADIOLOGIST EVAL & MGMT  02/09/2019  . IR RADIOLOGIST EVAL & MGMT  02/10/2020  . LOWER EXTREMITY ANGIOGRAM Bilateral 12/27/2013   Procedure: LOWER EXTREMITY ANGIOGRAM;  Surgeon: Conrad Fostoria, MD;  Location[facility] CATH LAB;  Service: Cardiovascular;  Laterality: Bilateral;      Allergies: Folic acid and Oxycodone  Medications: Prior to Admission medications   Medication Sig Start Date End Date Taking? Authorizing Provider  albuterol (VENTOLIN HFA) 108 (90 Base) MCG/ACT inhaler Inhale 2 puffs into the lungs every 6 (six) hours as needed for wheezing or shortness of breath. 11/26/19  Yes Azzie Glatter, FNP  ALPRAZolam Duanne Moron) 0.5 MG tablet Take 0.5 mg by mouth  2 (two) times daily.    Yes [provider]  bisacodyl (DULCOLAX) 5 MG EC tablet Take 5 mg by mouth daily as needed for mild constipation or moderate constipation. Reported on 04/03/2016   Yes [provider]  FLUoxetine (PROZAC) 40 MG capsule Take 1 capsule (40 mg total) by  mouth every morning. 11/26/19  Yes Azzie Glatter, FNP  furosemide (LASIX) 20 MG tablet TAKE 1 TABLET(20 MG) BY MOUTH DAILY 11/29/19  Yes Azzie Glatter, FNP  gabapentin (NEURONTIN) 100 MG capsule Take 1 capsule, by mouth 3 times a day. 11/26/19  Yes Azzie Glatter, FNP  hydrOXYzine (ATARAX/VISTARIL) 10 MG tablet Take 1 tablet (10 mg total) by mouth 3 (three) times daily as needed. 11/26/19  Yes Azzie Glatter, FNP  pantoprazole (PROTONIX) 40 MG tablet TAKE 1 TABLET(40 MG) BY MOUTH TWICE DAILY BEFORE A MEAL 11/26/19  Yes Azzie Glatter, FNP  spironolactone (ALDACTONE) 25 MG tablet Take 3 tablets (75 mg total) by mouth daily. 11/26/19  Yes Azzie Glatter, FNP  vitamin C (ASCORBIC ACID) 250 MG tablet Take 500 mg by mouth daily.   Yes [provider]  FEROSUL 325 (65 Fe) MG tablet TAKE 1 TABLET(325 MG) BY MOUTH DAILY WITH BREAKFAST Patient not taking: Reported on 06/17/2018 11/10/17   Scot Jun, FNP  furosemide (LASIX) 20 MG tablet TAKE 1 TABLET BY MOUTH EVERY MORNING 11/26/19   Azzie Glatter, FNP     Vital Signs: Blood pressure 136/81, heart rate 61, temp 98.6, respirations 16, O2 sat 98% room air Ht 5\' 8"  (1.727 m)   Wt 180 lb (81.6 kg)   BMI 27.37 kg/m   Physical Exam awake, alert.  Chest clear to auscultation bilaterally.  Heart with regular rate and rhythm.  Abdomen soft, positive bowel sounds, nontender.  No significant lower extremity edema.  Imaging: No results found.  Labs:  CBC: Recent Labs    11/26/19 1213 01/24/20 1230 03/10/20 0810  WBC 3.6 3.9 3.3*  HGB 16.0 16.1 15.7  HCT 48.4 48.1 47.5  PLT 44* 58* PENDING    COAGS: Recent Labs    01/24/20 1230 03/10/20 0810  INR 1.1 1.2    BMP: Recent Labs    01/24/20 1230 03/10/20 0810  NA 137 133*  K 4.4 3.2*  CL 99 101  CO2 29 22  GLUCOSE 108 103*  BUN 11 13  CALCIUM 9.3 8.6*  CREATININE 1.02 1.04  GFRNONAA 77 >60  GFRAA 90 >60    LIVER FUNCTION TESTS: Recent Labs      01/24/20 1230 03/10/20 0810  BILITOT 1.2 1.2  AST 22 23  ALT 19 18  ALKPHOS  --  77  PROT 7.3 7.1  ALBUMIN  --  4.0    Assessment and Plan: 64 y.o. male withalcoholic and HCV cirrhosis complicated by hepatocellular carcinoma, ascites and esophageal varices. He was treated by combination drug-eluting bead transarterial chemoembolization followed by microwave thermal ablation on 6/25 and 06/10/2014. His most recent MRI demonstrates significant interval progression of disease with new multifocal hepatocellular carcinoma.  He now has disease in both the right and left liver including hepatic segments 7, 6 and 2.  His largest lesion is now over 4 cm and he has more than 3 lesions.  This effectively places him outside of the [place] criteria and excludes him from possible liver transplantation. His latest AFP in February of this year was 132.3.  Following recent discussions with Dr. Laurence Ferrari he was deemed  an appropriate candidate for hepatic Y 90 treatment and presents today for preplanning hepatic/visceral arteriography with embolization and test Y 90 dosing. Risks and benefits of procedure were discussed with the patient including, but not limited to bleeding, infection, vascular injury or contrast induced renal failure.  This interventional procedure involves the use of X-rays and because of the nature of the planned procedure, it is possible that we will have prolonged use of X-ray fluoroscopy.  Potential radiation risks to you include (but are not limited to) the following: - A slightly elevated risk for cancer  several years later in life. This risk is typically less than 0.5% percent. This risk is low in comparison to the normal incidence of human cancer, which is 33% for women and 50% for men according to the [employer]. - Radiation induced injury can include skin redness, resembling a rash, tissue breakdown / ulcers and hair loss (which can be temporary or permanent).    The likelihood of either of these occurring depends on the difficulty of the procedure and whether you are sensitive to radiation due to previous procedures, disease, or genetic conditions.   IF your procedure requires a prolonged use of radiation, you will be notified and given written instructions for further action.  It is your responsibility to monitor the irradiated area for the 2 weeks following the procedure and to notify your physician if you are concerned that you have suffered a radiation induced injury.    All of the patient's questions were answered, patient is agreeable to proceed.  Consent signed and in chart.      Electronically Signed: D. Rowe Robert, PA-C 03/10/2020, 8:45 AM   I spent a total of 30 minutes at the the patient's bedside AND on the patient's hospital floor or unit, greater than 50% of which was counseling/coordinating care for hepatic/visceral arteriogram with embolization and test Y 90 dosing

## 2020-03-10 NOTE — Sedation Documentation (Signed)
Patient's SPO2 dropped to 50s. Sternal rub and jaw lift performed by this RN and Dr Laurence Ferrari. Non-rebreather face mask applied at 15L/min O2. Patient roused and SPO2 increased to 100%. No reversal medication required. Continuing to monitor patient closely.

## 2020-03-10 NOTE — Procedures (Signed)
Interventional Radiology Procedure Note  Procedure: [id number] mapping study with embolization of GDA and RGA  Complications: None  Estimated Blood Loss: None  Recommendations: - TR band takedown - DC home  Signed,  Criselda Peaches, MD

## 2020-03-11 LAB — AFP TUMOR MARKER: AFP, Serum, Tumor Marker: 867 ng/mL — ABNORMAL HIGH (ref 0.0–8.3)

## 2020-03-11 LAB — CEA: CEA: 2.4 ng/mL (ref 0.0–4.7)

## 2020-03-13 ENCOUNTER — Telehealth: Payer: Self-pay | Admitting: Family Medicine

## 2020-03-13 NOTE — Telephone Encounter (Signed)
[name] is seeking a second opinion about his cancer. Please advise.

## 2020-03-13 NOTE — Telephone Encounter (Signed)
Pt wants cancer results sent to a different oncologist for a second opinion. Please call pt back for more info

## 2020-03-20 ENCOUNTER — Other Ambulatory Visit: Payer: Self-pay | Admitting: Radiology

## 2020-03-21 ENCOUNTER — Encounter (HOSPITAL_COMMUNITY): Payer: Self-pay

## 2020-03-21 ENCOUNTER — Encounter (HOSPITAL_COMMUNITY)
Admission: RE | Admit: 2020-03-21 | Discharge: 2020-03-21 | Disposition: A | Discharge status: Home / Self Care | Payer: Medicare Other | Source: Ambulatory Visit | Attending: Interventional Radiology | Admitting: Interventional Radiology

## 2020-03-21 ENCOUNTER — Ambulatory Visit (HOSPITAL_COMMUNITY)
Admission: RE | Admit: 2020-03-21 | Discharge: 2020-03-21 | Disposition: A | Discharge status: Home / Self Care | Payer: Medicare Other | Source: Ambulatory Visit | Attending: Interventional Radiology | Admitting: Interventional Radiology

## 2020-03-21 ENCOUNTER — Other Ambulatory Visit (HOSPITAL_COMMUNITY): Payer: Self-pay | Admitting: Interventional Radiology

## 2020-03-21 ENCOUNTER — Other Ambulatory Visit: Payer: Self-pay

## 2020-03-21 DIAGNOSIS — I10 Essential (primary) hypertension: Secondary | ICD-10-CM | POA: Insufficient documentation

## 2020-03-21 DIAGNOSIS — C220 Liver cell carcinoma: Secondary | ICD-10-CM

## 2020-03-21 DIAGNOSIS — Z79899 Other long term (current) drug therapy: Secondary | ICD-10-CM | POA: Diagnosis not present

## 2020-03-21 DIAGNOSIS — F419 Anxiety disorder, unspecified: Secondary | ICD-10-CM | POA: Diagnosis not present

## 2020-03-21 DIAGNOSIS — Z888 Allergy status to other drugs, medicaments and biological substances status: Secondary | ICD-10-CM | POA: Diagnosis not present

## 2020-03-21 DIAGNOSIS — Z885 Allergy status to narcotic agent status: Secondary | ICD-10-CM | POA: Insufficient documentation

## 2020-03-21 DIAGNOSIS — F329 Major depressive disorder, single episode, unspecified: Secondary | ICD-10-CM | POA: Diagnosis not present

## 2020-03-21 DIAGNOSIS — B1920 Unspecified viral hepatitis C without hepatic coma: Secondary | ICD-10-CM | POA: Diagnosis not present

## 2020-03-21 DIAGNOSIS — K7031 Alcoholic cirrhosis of liver with ascites: Secondary | ICD-10-CM | POA: Diagnosis not present

## 2020-03-21 DIAGNOSIS — I70212 Atherosclerosis of native arteries of extremities with intermittent claudication, left leg: Secondary | ICD-10-CM | POA: Insufficient documentation

## 2020-03-21 HISTORY — PX: IR ANGIOGRAM EXTREMITY RIGHT: IMG652

## 2020-03-21 HISTORY — PX: IR ANGIOGRAM SELECTIVE EACH ADDITIONAL VESSEL: IMG667

## 2020-03-21 HISTORY — PX: IR ANGIOGRAM VISCERAL SELECTIVE: IMG657

## 2020-03-21 HISTORY — PX: IR US GUIDE VASC ACCESS LEFT: IMG2389

## 2020-03-21 HISTORY — PX: IR US GUIDE VASC ACCESS RIGHT: IMG2390

## 2020-03-21 HISTORY — PX: IR EMBO TUMOR ORGAN ISCHEMIA INFARCT INC GUIDE ROADMAPPING: IMG5449

## 2020-03-21 LAB — CBC WITH DIFFERENTIAL/PLATELET
Abs Immature Granulocytes: 0.01 10*3/uL (ref 0.00–0.07)
Basophils Absolute: 0 10*3/uL (ref 0.0–0.1)
Basophils Relative: 1 %
Eosinophils Absolute: 0.1 10*3/uL (ref 0.0–0.5)
Eosinophils Relative: 2 %
HCT: 45.4 % (ref 39.0–52.0)
Hemoglobin: 14.8 g/dL (ref 13.0–17.0)
Immature Granulocytes: 0 %
Lymphocytes Relative: 22 %
Lymphs Abs: 0.7 10*3/uL (ref 0.7–4.0)
MCH: 29.1 pg (ref 26.0–34.0)
MCHC: 32.6 g/dL (ref 30.0–36.0)
MCV: 89.2 fL (ref 80.0–100.0)
Monocytes Absolute: 0.5 10*3/uL (ref 0.1–1.0)
Monocytes Relative: 16 %
Neutro Abs: 1.9 10*3/uL (ref 1.7–7.7)
Neutrophils Relative %: 59 %
Platelets: 62 10*3/uL — ABNORMAL LOW (ref 150–400)
RBC: 5.09 MIL/uL (ref 4.22–5.81)
RDW: 14.4 % (ref 11.5–15.5)
WBC: 3.3 10*3/uL — ABNORMAL LOW (ref 4.0–10.5)
nRBC: 0 % (ref 0.0–0.2)

## 2020-03-21 LAB — COMPREHENSIVE METABOLIC PANEL
ALT: 18 U/L (ref 0–44)
AST: 24 U/L (ref 15–41)
Albumin: 3.6 g/dL (ref 3.5–5.0)
Alkaline Phosphatase: 86 U/L (ref 38–126)
Anion gap: 9 (ref 5–15)
BUN: 12 mg/dL (ref 8–23)
CO2: 25 mmol/L (ref 22–32)
Calcium: 9 mg/dL (ref 8.9–10.3)
Chloride: 103 mmol/L (ref 98–111)
Creatinine, Ser: 0.86 mg/dL (ref 0.61–1.24)
GFR calc Af Amer: >60 mL/min (ref >60)
GFR calc non Af Amer: >60 mL/min (ref >60)
Glucose, Bld: 99 mg/dL (ref 70–99)
Potassium: 4.3 mmol/L (ref 3.5–5.1)
Sodium: 137 mmol/L (ref 135–145)
Total Bilirubin: 1 mg/dL (ref 0.3–1.2)
Total Protein: 6.7 g/dL (ref 6.5–8.1)

## 2020-03-21 MED ORDER — SODIUM CHLORIDE 0.9 % IV SOLN
8.0000 mg | Freq: Once | INTRAVENOUS | Status: AC
  Administered 2020-03-21: 8 mg via INTRAVENOUS
  Filled 2020-03-21 (×2): qty 4

## 2020-03-21 MED ORDER — FENTANYL CITRATE (PF) 100 MCG/2ML IJ SOLN
INTRAMUSCULAR | Status: AC
  Filled 2020-03-21: qty 4

## 2020-03-21 MED ORDER — MIDAZOLAM HCL 2 MG/2ML IJ SOLN
INTRAMUSCULAR | Status: AC
  Filled 2020-03-21: qty 6

## 2020-03-21 MED ORDER — LIDOCAINE HCL 1 % IJ SOLN
INTRAMUSCULAR | Status: AC
  Filled 2020-03-21: qty 20

## 2020-03-21 MED ORDER — IOHEXOL 300 MG/ML  SOLN
50.0000 mL | Freq: Once | INTRAMUSCULAR | Status: AC | PRN
  Administered 2020-03-21: 11:00:00 10 mL via INTRA_ARTERIAL

## 2020-03-21 MED ORDER — LIDOCAINE HCL (PF) 1 % IJ SOLN
INTRAMUSCULAR | Status: AC | PRN
  Administered 2020-03-21 (×2): 5 mL

## 2020-03-21 MED ORDER — IOHEXOL 300 MG/ML  SOLN
100.0000 mL | Freq: Once | INTRAMUSCULAR | Status: AC | PRN
  Administered 2020-03-21: 11:00:00 38 mL via INTRA_ARTERIAL

## 2020-03-21 MED ORDER — PIPERACILLIN-TAZOBACTAM 3.375 G IVPB
INTRAVENOUS | Status: AC
  Filled 2020-03-21: qty 50

## 2020-03-21 MED ORDER — PIPERACILLIN-TAZOBACTAM 3.375 G IVPB
3.3750 g | Freq: Once | INTRAVENOUS | Status: AC
  Administered 2020-03-21: 3.375 g via INTRAVENOUS

## 2020-03-21 MED ORDER — SODIUM CHLORIDE 0.9 % IV SOLN: INTRAVENOUS | Status: DC

## 2020-03-21 MED ORDER — PANTOPRAZOLE SODIUM 40 MG IV SOLR
40.0000 mg | Freq: Once | INTRAVENOUS | Status: AC
  Administered 2020-03-21: 09:00:00 40 mg via INTRAVENOUS
  Filled 2020-03-21: qty 40

## 2020-03-21 MED ORDER — MIDAZOLAM HCL 2 MG/2ML IJ SOLN
INTRAMUSCULAR | Status: AC | PRN
  Administered 2020-03-21: 1 mg via INTRAVENOUS

## 2020-03-21 MED ORDER — DIPHENHYDRAMINE HCL 50 MG/ML IJ SOLN
INTRAMUSCULAR | Status: AC | PRN
  Administered 2020-03-21: 25 mg via INTRAVENOUS

## 2020-03-21 MED ORDER — DIPHENHYDRAMINE HCL 50 MG/ML IJ SOLN
INTRAMUSCULAR | Status: AC
  Filled 2020-03-21: qty 1

## 2020-03-21 MED ORDER — DEXAMETHASONE SODIUM PHOSPHATE 10 MG/ML IJ SOLN
8.0000 mg | Freq: Once | INTRAMUSCULAR | Status: AC
  Administered 2020-03-21: 8 mg via INTRAVENOUS
  Filled 2020-03-21: qty 1

## 2020-03-21 NOTE — Sedation Documentation (Signed)
Transferred to nuc med for study 

## 2020-03-21 NOTE — Procedures (Signed)
Interventional Radiology Procedure Note  Procedure: Right lobar Y90.   Complications: None  Estimated Blood Loss: None  Recommendations: - Bedrest 6 hrs - DC home  Signed,  Criselda Peaches, MD

## 2020-03-21 NOTE — Discharge Instructions (Signed)
Please call Interventional Radiology clinic [phone] with any questions or concerns.  You may remove your dressing and shower tomorrow.    Moderate Conscious Sedation, Adult, Care After These instructions provide you with information about caring for yourself after your procedure. Your health care provider may also give you more specific instructions. Your treatment has been planned according to current medical practices, but problems sometimes occur. Call your health care provider if you have any problems or questions after your procedure. What can I expect after the procedure? After your procedure, it is common:  To feel sleepy for several hours.  To feel clumsy and have poor balance for several hours.  To have poor judgment for several hours.  To vomit if you eat too soon. Follow these instructions at home: For at least 24 hours after the procedure:   Do not: ? Participate in activities where you could fall or become injured. ? Drive. ? Use heavy machinery. ? Drink alcohol. ? Take sleeping pills or medicines that cause drowsiness. ? Make important decisions or sign legal documents. ? Take care of children on your own.  Rest. Eating and drinking  Follow the diet recommended by your health care provider.  If you vomit: ? Drink water, juice, or soup when you can drink without vomiting. ? Make sure you have little or no nausea before eating solid foods. General instructions  Have a responsible adult stay with you until you are awake and alert.  Take over-the-counter and prescription medicines only as told by your health care provider.  If you smoke, do not smoke without supervision.  Keep all follow-up visits as told by your health care provider. This is important. Contact a health care provider if:  You keep feeling nauseous or you keep vomiting.  You feel light-headed.  You develop a rash.  You have a fever. Get help right away if:  You have trouble  breathing. This information is not intended to replace advice given to you by your health care provider. Make sure you discuss any questions you have with your health care provider. Document Revised: 11/14/2017 Document Reviewed: 03/23/2016 Elsevier Patient Education  [place] Y-90 Radioembolization Discharge Instructions  You have been given a radioactive material during your procedure.  While it is safe for you to be discharged home from the hospital, you need to proceed directly home.    Do not use public transportation, including air travel, lasting more than 2 hours for 1 week.  Avoid crowded public places for 1 week.  Adult visitors should try to avoid close contact with you for 1 week.    Children and pregnant females should not visit or have close contact with you for 1 week.  Items that you touch are not radioactive.  Do not sleep in the same bed as your partner for 1 week, and a condom should be used for sexual activity during the first 24 hours.  Your blood may be radioactive and caution should be used if any bleeding occurs during the recovery period.  Body fluids may be radioactive for 24 hours.  Wash your hands after voiding.  Men should sit to urinate.  Dispose of any soiled materials (flush down toilet or place in trash at home) during the first day.  Drink 6 to 8 glasses of fluids per day for 5 days to hydrate yourself.  If you need to see a doctor during the first week, you must let them know that you were treated  with yttrium-90 microspheres, and will be slightly radioactive.  They can call Interventional Radiology [phone] with any questions.   Hepatic Artery Radioembolization, Care After This sheet gives you information about how to care for yourself after your procedure. Your health care provider may also give you more specific instructions. If you have problems or questions, contact your health care provider. What can I expect after the  procedure? After the procedure, it is common to have:  A slight fever for 1-2 weeks. If your fever gets worse, tell your health care provider.  Fatigue.  Loss of appetite. This should gradually improve after about 1 week.  Abdominal pain on your right side.  Soreness and tenderness in your groin area where the needle and catheter were placed (puncture site). Follow these instructions at home:  Puncture site care  Follow instructions from your health care provider about how to take care of the puncture site. Make sure you: ? Wash your hands with soap and water before you change your bandage (dressing). If soap and water are not available, use hand sanitizer. ? Change your dressing as told by your health care provider. ? Leave stitches (sutures), skin glue, or adhesive strips in place. These skin closures may need to stay in place for 2 weeks or longer. If adhesive strip edges start to loosen and curl up, you may trim the loose edges. Do not remove adhesive strips completely unless your health care provider tells you to do that.  Check your puncture site every day for signs of infection. Check for: ? More redness, swelling, or pain. ? More fluid or blood. ? Warmth. ? Pus or a bad smell. Activity  Rest and return to your normal activities as told by your health care provider. Ask your health care provider what activities are safe for you.  Do not drive for 24 hours after the procedure if you were given a medicine to help you relax (sedative).  Do not lift anything that is heavier than 10 lb (4.5 kg) until your health care provider says that it is safe. Medicines  Take over-the-counter and prescription medicines only as told by your health care provider.  Do not drive or use heavy machinery while taking prescription pain medicine. Radiation precautions  For up to a week after your procedure, there will be a small amount of radioactivity near your liver. This is not especially  dangerous to other people. However, you should follow these precautions for 7 days: ? Do not come in close contact with people. ? Do not sleep in the same bed as someone else. ? Do not hold children or babies. ? Do not have contact with pregnant women. General instructions  To prevent or treat constipation while you are taking prescription pain medicine, your health care provider may recommend that you: ? Drink enough fluid to keep your urine clear or pale yellow. ? Take over-the-counter or prescription medicines. ? Eat foods that are high in fiber, such as fresh fruits and vegetables, whole grains, and beans. ? Limit foods that are high in fat and processed sugars, such as fried and sweet foods.  Eat frequent small meals until your appetite returns. Follow instructions from your health care provider about eating or drinking restrictions.  Do not take baths, swim, or use a hot tub until your health care provider approves. You may take showers. Wash your puncture site with mild soap and water and pat the area dry.  Wear compression stockings as told by your health  care provider. These stockings help to prevent blood clots and reduce swelling in your legs.  Keep all follow-up visits as told by your health care provider. This is important. You may need to have blood tests and imaging tests done. Contact a health care provider if:  You have more redness, swelling, or pain around your puncture site.  You have more fluid or blood coming from your puncture site.  Your puncture site feels warm to the touch.  You have pus or a bad smell coming from your puncture site.  You have pain that: ? Gets worse. ? Does not get better with medicine. ? Feels like very bad heartburn. ? Is in the middle of your abdomen, above your belly button.  Your skin and the white parts of your eyes turn yellow (jaundice).  The color of your urine changes to dark brown.  The color of your stool changes to light  yellow.  Your abdominal measurement (girth) increases in a short period of time.  You gain more than 5 lb (2.3 kg) in a short period of time. Get help right away if:  You have a fever that lasts longer than 2 weeks or is higher than what your health care provider told you to expect.  You develop any of the following in your legs: ? Pain. ? Swelling. ? Skin that is cold or pale or turns blue.  You have chest pain.  You have blood in your vomit, saliva, or stool.  You have trouble breathing. This information is not intended to replace advice given to you by your health care provider. Make sure you discuss any questions you have with your health care provider. Document Revised: 11/14/2017 Document Reviewed: 08/31/2016 Elsevier Patient Education  [place].

## 2020-03-21 NOTE — H&P (Signed)
Referring Physician(s): Magrinat,G/[name],D  Supervising Physician: Jacqulynn Cadet  Patient Status:  [facility]  Chief Complaint: Progressive multifocal hepatocellular carcinoma   Subjective: Jeffrey Snow a 64 y.o.malewithalcoholic and HCV cirrhosis complicated by hepatocellular carcinoma, ascites and esophageal varices. He was treated by combination drug-eluting bead transarterial chemoembolization followed by microwave thermal ablation on 6/25 and 06/10/2014. His most recent MRI demonstrates significant interval progression of disease with new multifocal hepatocellular carcinoma. He now has disease in both the right and left liver including hepatic segments 7, 6 and 2. His largest lesion is now over 4 cm and he has more than 3 lesions. This effectively places him outside of the [place] criteria and excludes him from possible liver transplantation. His latest AFP in February of this year was 132.3.  Following recent discussions with Dr. Laurence Ferrari he was deemed an appropriate candidate for hepatic Y 90 treatment and presents today for Y 90 treatment of the right hepatic lobe.  He currently denies fever, headache, chest pain, dyspnea, cough, abdominal/back pain, nausea, vomiting or bleeding.  Past Medical History:  Diagnosis Date  . Abnormal MRI of abdomen, liver  04/21/2014  . Anxiety   . Cirrhosis, alcoholic ([place]) 123456  . Claudication of left lower extremity (HCC) m-1  . Depression   . [place] (hepatocellular carcinoma) ([place])   . Hepatitis C   . Hypertension   . Liver mass   . Panic attack    Past Surgical History:  Procedure Laterality Date  . ABDOMINAL AORTAGRAM N/A 12/27/2013   Procedure: ABDOMINAL Maxcine Ham;  Surgeon: Conrad [place], MD;  Location: [facility] CATH LAB;  Service: Cardiovascular;  Laterality: N/A;  . AORTOGRAM    . COSMETIC SURGERY     facial reconstructive surgery  . ESOPHAGOGASTRODUODENOSCOPY N/A 12/17/2014   Procedure: ESOPHAGOGASTRODUODENOSCOPY (EGD);   Surgeon: Missy Sabins, MD;  Location: [facility] ENDOSCOPY;  Service: Endoscopy;  Laterality: N/A;  . ESOPHAGOGASTRODUODENOSCOPY N/A 09/05/2015   Procedure: ESOPHAGOGASTRODUODENOSCOPY (EGD);  Surgeon: Wonda Horner, MD;  Location: [facility] ENDOSCOPY;  Service: Endoscopy;  Laterality: N/A;  . IR ANGIOGRAM SELECTIVE EACH ADDITIONAL VESSEL  03/10/2020  . IR ANGIOGRAM SELECTIVE EACH ADDITIONAL VESSEL  03/10/2020  . IR ANGIOGRAM SELECTIVE EACH ADDITIONAL VESSEL  03/10/2020  . IR ANGIOGRAM SELECTIVE EACH ADDITIONAL VESSEL  03/10/2020  . IR ANGIOGRAM SELECTIVE EACH ADDITIONAL VESSEL  03/10/2020  . IR ANGIOGRAM VISCERAL SELECTIVE  03/10/2020  . IR EMBO ARTERIAL NOT HEMORR HEMANG INC GUIDE ROADMAPPING  03/10/2020  . IR GENERIC HISTORICAL  01/19/2015   IR RADIOLOGIST EVAL & MGMT 01/19/2015 Jacqulynn Cadet, MD [facility] INTERV RAD  . IR GENERIC HISTORICAL  07/23/2016   IR RADIOLOGIST EVAL & MGMT 07/23/2016 Jacqulynn Cadet, MD [facility] INTERV RAD  . IR GENERIC HISTORICAL  05/30/2015   IR RADIOLOGIST EVAL & MGMT 05/30/2015 [facility] INTERV RAD  . IR GENERIC HISTORICAL  10/04/2014   IR RADIOLOGIST EVAL & MGMT 10/04/2014 Jacqulynn Cadet, MD [facility] INTERV RAD  . IR GENERIC HISTORICAL  02/11/2017   IR RADIOLOGIST EVAL & MGMT 02/11/2017 Jacqulynn Cadet, MD [facility] INTERV RAD  . IR RADIOLOGIST EVAL & MGMT  08/13/2017  . IR RADIOLOGIST EVAL & MGMT  08/11/2018  . IR RADIOLOGIST EVAL & MGMT  02/09/2019  . IR RADIOLOGIST EVAL & MGMT  02/10/2020  . IR US GUIDE VASC ACCESS LEFT  03/10/2020  . LOWER EXTREMITY ANGIOGRAM Bilateral 12/27/2013   Procedure: LOWER EXTREMITY ANGIOGRAM;  Surgeon: Conrad [place], MD;  Location: [facility] CATH LAB;  Service: Cardiovascular;  Laterality:  Bilateral;      Allergies: Folic acid and Oxycodone  Medications: Prior to Admission medications   Medication Sig Start Date End Date Taking? Authorizing Provider  albuterol (VENTOLIN HFA) 108 (90 Base) MCG/ACT inhaler Inhale 2 puffs into the lungs every 6 (six) hours as needed for  wheezing or shortness of breath. 11/26/19  Yes Azzie Glatter, FNP  ALPRAZolam Duanne Moron) 0.5 MG tablet Take 0.5 mg by mouth 2 (two) times daily.    Yes [provider]  bisacodyl (DULCOLAX) 5 MG EC tablet Take 5 mg by mouth daily as needed for mild constipation or moderate constipation. Reported on 04/03/2016   Yes [provider]  FLUoxetine (PROZAC) 40 MG capsule Take 1 capsule (40 mg total) by mouth every morning. 11/26/19  Yes Azzie Glatter, FNP  furosemide (LASIX) 20 MG tablet TAKE 1 TABLET(20 MG) BY MOUTH DAILY 11/29/19  Yes Azzie Glatter, FNP  furosemide (LASIX) 20 MG tablet TAKE 1 TABLET BY MOUTH EVERY MORNING 11/26/19  Yes Azzie Glatter, FNP  gabapentin (NEURONTIN) 100 MG capsule Take 1 capsule, by mouth 3 times a day. 11/26/19  Yes Azzie Glatter, FNP  hydrOXYzine (ATARAX/VISTARIL) 10 MG tablet Take 1 tablet (10 mg total) by mouth 3 (three) times daily as needed. 11/26/19  Yes Azzie Glatter, FNP  pantoprazole (PROTONIX) 40 MG tablet TAKE 1 TABLET(40 MG) BY MOUTH TWICE DAILY BEFORE A MEAL 11/26/19  Yes Azzie Glatter, FNP  spironolactone (ALDACTONE) 25 MG tablet Take 3 tablets (75 mg total) by mouth daily. 11/26/19  Yes Azzie Glatter, FNP  vitamin C (ASCORBIC ACID) 250 MG tablet Take 500 mg by mouth daily.   Yes [provider]  FEROSUL 325 (65 Fe) MG tablet TAKE 1 TABLET(325 MG) BY MOUTH DAILY WITH BREAKFAST Patient not taking: Reported on 06/17/2018 11/10/17   Scot Jun, FNP     Vital Signs: Blood pressure 153/78, temp 98.3, heart rate 57, respirations 16, O2 sat 98% room air   Physical Exam awake, alert.  Chest clear to auscultation bilaterally.  Heart with slightly bradycardic but regular rhythm.  Abdomen soft, positive bowel sounds, nontender.  No significant lower extremity edema.  Imaging: No results found.  Labs:  CBC: Recent Labs    11/26/19 1213 01/24/20 1230 03/10/20 0810  WBC 3.6 3.9 3.3*  HGB 16.0 16.1  15.7  HCT 48.4 48.1 47.5  PLT 44* 58* PLATELET CLUMPS NOTED ON SMEAR, COUNT APPEARS DECREASED    COAGS: Recent Labs    01/24/20 1230 03/10/20 0810  INR 1.1 1.2    BMP: Recent Labs    01/24/20 1230 03/10/20 0810  NA 137 133*  K 4.4 3.2*  CL 99 101  CO2 29 22  GLUCOSE 108 103*  BUN 11 13  CALCIUM 9.3 8.6*  CREATININE 1.02 1.04  GFRNONAA 77 >60  GFRAA 90 >60    LIVER FUNCTION TESTS: Recent Labs    01/24/20 1230 03/10/20 0810  BILITOT 1.2 1.2  AST 22 23  ALT 19 18  ALKPHOS  --  77  PROT 7.3 7.1  ALBUMIN  --  4.0    Assessment and Plan: 63 y.o.malewithalcoholic and HCV cirrhosis complicated by hepatocellular carcinoma, ascites and esophageal varices. He was treated by combination drug-eluting bead transarterial chemoembolization followed by microwave thermal ablation on 6/25 and 06/10/2014. His most recent MRI demonstrates significant interval progression of disease with new multifocal hepatocellular carcinoma. He now has disease in both the right and left liver  including hepatic segments 7, 6 and 2. His largest lesion is now over 4 cm and he has more than 3 lesions. This effectively places him outside of the [place] criteria and excludes him from possible liver transplantation. His latest AFP in February of this year was 132.3.  Following recent discussions with Dr. Laurence Ferrari he was deemed an appropriate candidate for hepatic Y 90 radioembolization and presents today for treatment of the right hepatic lobe.Risks and benefits of procedure were discussed with the patient including, but not limited to bleeding, infection, vascular injury or contrast induced renal failure.  This interventional procedure involves the use of X-rays and because of the nature of the planned procedure, it is possible that we will have prolonged use of X-ray fluoroscopy.  Potential radiation risks to you include (but are not limited to) the following: - A slightly elevated risk for cancer   several years later in life. This risk is typically less than 0.5% percent. This risk is low in comparison to the normal incidence of human cancer, which is 33% for women and 50% for men according to the [place]. - Radiation induced injury can include skin redness, resembling a rash, tissue breakdown / ulcers and hair loss (which can be temporary or permanent).   The likelihood of either of these occurring depends on the difficulty of the procedure and whether you are sensitive to radiation due to previous procedures, disease, or genetic conditions.   IF your procedure requires a prolonged use of radiation, you will be notified and given written instructions for further action.  It is your responsibility to monitor the irradiated area for the 2 weeks following the procedure and to notify your physician if you are concerned that you have suffered a radiation induced injury.    All of the patient's questions were answered, patient is agreeable to proceed.  Consent signed and in chart.      Electronically Signed: D. Rowe Robert, PA-C 03/21/2020, 8:44 AM   I spent a total of 25 minutes at the the patient's bedside AND on the patient's hospital floor or unit, greater than 50% of which was counseling/coordinating care for hepatic/visceral arteriogram with Y 90 radioembolization

## 2020-04-01 ENCOUNTER — Other Ambulatory Visit: Payer: Self-pay | Admitting: Family Medicine

## 2020-04-01 DIAGNOSIS — K7031 Alcoholic cirrhosis of liver with ascites: Secondary | ICD-10-CM

## 2020-04-17 ENCOUNTER — Other Ambulatory Visit: Payer: Self-pay | Admitting: Radiology

## 2020-04-18 ENCOUNTER — Ambulatory Visit (HOSPITAL_COMMUNITY)
Admission: RE | Admit: 2020-04-18 | Discharge: 2020-04-18 | Disposition: A | Discharge status: Home / Self Care | Payer: Medicare Other | Source: Ambulatory Visit | Attending: Interventional Radiology | Admitting: Interventional Radiology

## 2020-04-18 ENCOUNTER — Other Ambulatory Visit (HOSPITAL_COMMUNITY): Payer: Self-pay | Admitting: Interventional Radiology

## 2020-04-18 ENCOUNTER — Ambulatory Visit (HOSPITAL_COMMUNITY)
Admission: RE | Admit: 2020-04-18 | Discharge: 2020-04-18 | Disposition: A | Discharge status: Home / Self Care | Payer: Medicare Other | Source: Ambulatory Visit

## 2020-04-18 ENCOUNTER — Encounter (HOSPITAL_COMMUNITY): Payer: Self-pay

## 2020-04-18 ENCOUNTER — Telehealth: Payer: Self-pay | Admitting: *Deleted

## 2020-04-18 ENCOUNTER — Other Ambulatory Visit: Payer: Self-pay

## 2020-04-18 DIAGNOSIS — F419 Anxiety disorder, unspecified: Secondary | ICD-10-CM | POA: Insufficient documentation

## 2020-04-18 DIAGNOSIS — K7030 Alcoholic cirrhosis of liver without ascites: Secondary | ICD-10-CM | POA: Diagnosis not present

## 2020-04-18 DIAGNOSIS — C220 Liver cell carcinoma: Secondary | ICD-10-CM

## 2020-04-18 DIAGNOSIS — F329 Major depressive disorder, single episode, unspecified: Secondary | ICD-10-CM | POA: Diagnosis not present

## 2020-04-18 DIAGNOSIS — I10 Essential (primary) hypertension: Secondary | ICD-10-CM | POA: Insufficient documentation

## 2020-04-18 DIAGNOSIS — Z79899 Other long term (current) drug therapy: Secondary | ICD-10-CM | POA: Insufficient documentation

## 2020-04-18 HISTORY — PX: IR ANGIOGRAM SELECTIVE EACH ADDITIONAL VESSEL: IMG667

## 2020-04-18 HISTORY — PX: IR EMBO TUMOR ORGAN ISCHEMIA INFARCT INC GUIDE ROADMAPPING: IMG5449

## 2020-04-18 HISTORY — PX: IR ANGIOGRAM VISCERAL SELECTIVE: IMG657

## 2020-04-18 HISTORY — PX: IR US GUIDE VASC ACCESS LEFT: IMG2389

## 2020-04-18 LAB — CBC WITH DIFFERENTIAL/PLATELET
Abs Immature Granulocytes: 0.02 10*3/uL (ref 0.00–0.07)
Basophils Absolute: 0 10*3/uL (ref 0.0–0.1)
Basophils Relative: 0 %
Eosinophils Absolute: 0.1 10*3/uL (ref 0.0–0.5)
Eosinophils Relative: 2 %
HCT: 47.8 % (ref 39.0–52.0)
Hemoglobin: 15.6 g/dL (ref 13.0–17.0)
Immature Granulocytes: 0 %
Lymphocytes Relative: 7 %
Lymphs Abs: 0.3 10*3/uL — ABNORMAL LOW (ref 0.7–4.0)
MCH: 30.2 pg (ref 26.0–34.0)
MCHC: 32.6 g/dL (ref 30.0–36.0)
MCV: 92.6 fL (ref 80.0–100.0)
Monocytes Absolute: 0.6 10*3/uL (ref 0.1–1.0)
Monocytes Relative: 14 %
Neutro Abs: 3.5 10*3/uL (ref 1.7–7.7)
Neutrophils Relative %: 77 %
Platelets: 61 10*3/uL — ABNORMAL LOW (ref 150–400)
RBC: 5.16 MIL/uL (ref 4.22–5.81)
RDW: 15.6 % — ABNORMAL HIGH (ref 11.5–15.5)
WBC: 4.6 10*3/uL (ref 4.0–10.5)
nRBC: 0 % (ref 0.0–0.2)

## 2020-04-18 LAB — COMPREHENSIVE METABOLIC PANEL
ALT: 30 U/L (ref 0–44)
AST: 33 U/L (ref 15–41)
Albumin: 3.9 g/dL (ref 3.5–5.0)
Alkaline Phosphatase: 132 U/L — ABNORMAL HIGH (ref 38–126)
Anion gap: 8 (ref 5–15)
BUN: 15 mg/dL (ref 8–23)
CO2: 23 mmol/L (ref 22–32)
Calcium: 8.9 mg/dL (ref 8.9–10.3)
Chloride: 107 mmol/L (ref 98–111)
Creatinine, Ser: 0.88 mg/dL (ref 0.61–1.24)
GFR calc Af Amer: >60 mL/min (ref >60)
GFR calc non Af Amer: >60 mL/min (ref >60)
Glucose, Bld: 104 mg/dL — ABNORMAL HIGH (ref 70–99)
Potassium: 4.3 mmol/L (ref 3.5–5.1)
Sodium: 138 mmol/L (ref 135–145)
Total Bilirubin: 1.1 mg/dL (ref 0.3–1.2)
Total Protein: 7.1 g/dL (ref 6.5–8.1)

## 2020-04-18 LAB — PROTIME-INR
INR: 1.2 (ref 0.8–1.2)
Prothrombin Time: 14.3 seconds (ref 11.4–15.2)

## 2020-04-18 MED ORDER — PIPERACILLIN-TAZOBACTAM 3.375 G IVPB
3.3750 g | Freq: Once | INTRAVENOUS | Status: AC
  Administered 2020-04-18: 3.375 g via INTRAVENOUS

## 2020-04-18 MED ORDER — PIPERACILLIN-TAZOBACTAM 3.375 G IVPB
INTRAVENOUS | Status: AC
  Filled 2020-04-18: qty 50

## 2020-04-18 MED ORDER — MIDAZOLAM HCL 2 MG/2ML IJ SOLN
INTRAMUSCULAR | Status: AC | PRN
  Administered 2020-04-18 (×2): 1 mg via INTRAVENOUS

## 2020-04-18 MED ORDER — MIDAZOLAM HCL 2 MG/2ML IJ SOLN
INTRAMUSCULAR | Status: AC
  Filled 2020-04-18: qty 4

## 2020-04-18 MED ORDER — VERAPAMIL HCL 2.5 MG/ML IV SOLN
INTRAVENOUS | Status: AC
  Filled 2020-04-18: qty 2

## 2020-04-18 MED ORDER — IOHEXOL 300 MG/ML  SOLN: 100.0000 mL | Freq: Once | INTRAMUSCULAR | Status: DC | PRN

## 2020-04-18 MED ORDER — DIPHENHYDRAMINE HCL 50 MG/ML IJ SOLN
INTRAMUSCULAR | Status: AC | PRN
  Administered 2020-04-18: 25 mg via INTRAVENOUS
  Administered 2020-04-18: 12.5 mg via INTRAVENOUS

## 2020-04-18 MED ORDER — PANTOPRAZOLE SODIUM 40 MG IV SOLR
INTRAVENOUS | Status: AC
  Filled 2020-04-18: qty 40

## 2020-04-18 MED ORDER — FENTANYL CITRATE (PF) 100 MCG/2ML IJ SOLN
INTRAMUSCULAR | Status: AC
  Filled 2020-04-18: qty 4

## 2020-04-18 MED ORDER — FENTANYL CITRATE (PF) 100 MCG/2ML IJ SOLN
INTRAMUSCULAR | Status: AC | PRN
  Administered 2020-04-18 (×3): 50 ug via INTRAVENOUS

## 2020-04-18 MED ORDER — HEPARIN SODIUM (PORCINE) 1000 UNIT/ML IJ SOLN
INTRAMUSCULAR | Status: AC
  Filled 2020-04-18: qty 1

## 2020-04-18 MED ORDER — LIDOCAINE HCL (PF) 1 % IJ SOLN
INTRAMUSCULAR | Status: AC | PRN
  Administered 2020-04-18: 5 mL

## 2020-04-18 MED ORDER — VERAPAMIL HCL 2.5 MG/ML IV SOLN: INTRA_ARTERIAL | Status: AC | PRN

## 2020-04-18 MED ORDER — NITROGLYCERIN IN D5W 100-5 MCG/ML-% IV SOLN
INTRAVENOUS | Status: AC
  Filled 2020-04-18: qty 250

## 2020-04-18 MED ORDER — PANTOPRAZOLE SODIUM 40 MG IV SOLR
40.0000 mg | Freq: Once | INTRAVENOUS | Status: AC
  Administered 2020-04-18: 40 mg via INTRAVENOUS

## 2020-04-18 MED ORDER — SODIUM CHLORIDE 0.9 % IV SOLN: INTRAVENOUS | Status: DC

## 2020-04-18 MED ORDER — SODIUM CHLORIDE 0.9 % IV SOLN
8.0000 mg | Freq: Once | INTRAVENOUS | Status: AC
  Administered 2020-04-18: 8 mg via INTRAVENOUS
  Filled 2020-04-18: qty 4

## 2020-04-18 MED ORDER — DEXAMETHASONE SODIUM PHOSPHATE 10 MG/ML IJ SOLN
INTRAMUSCULAR | Status: AC
  Filled 2020-04-18: qty 1

## 2020-04-18 MED ORDER — LIDOCAINE HCL 1 % IJ SOLN
INTRAMUSCULAR | Status: AC
  Filled 2020-04-18: qty 20

## 2020-04-18 MED ORDER — DIPHENHYDRAMINE HCL 50 MG/ML IJ SOLN
INTRAMUSCULAR | Status: AC
  Filled 2020-04-18: qty 1

## 2020-04-18 MED ORDER — YTTRIUM 90 INJECTION: 14.4000 | INJECTION | Freq: Once | INTRAVENOUS | Status: DC

## 2020-04-18 MED ORDER — DEXAMETHASONE SODIUM PHOSPHATE 10 MG/ML IJ SOLN
8.0000 mg | Freq: Once | INTRAMUSCULAR | Status: AC
  Administered 2020-04-18: 10 mg via INTRAVENOUS

## 2020-04-18 NOTE — Progress Notes (Signed)
Patient has Medicaid transportation home and has a neighbor who will check on him after procedure.  Dr. Laurence Ferrari and Rowe Robert, PA aware

## 2020-04-18 NOTE — Sedation Documentation (Signed)
End y90

## 2020-04-18 NOTE — H&P (Signed)
Referring Physician(s): Magrinat,G/[name],D  Supervising Physician: Jacqulynn Cadet  Patient Status:  [facility]  Chief Complaint: Progressive multifocal hepatocellular carcinoma   Subjective: Jeffrey Weers Lassiteris a 64 y.o.malewithalcoholic and HCV cirrhosis complicated by hepatocellular carcinoma, ascites and esophageal varices. He was treated by combination drug-eluting bead transarterial chemoembolization followed by microwave thermal ablation on 6/25 and 06/10/2014.His most recent MRI demonstrates significant interval progression of disease with new multifocal hepatocellular carcinoma. He now has disease in both the right and left liver including hepatic segments 7, 6 and 2. His largest lesion is now over 4 cm and he has more than 3 lesions. This effectively places him outside of the [place] criteria and excludes him from possible liver transplantation.His latest AFP in March of this year was 2.4, AFP 867.Following recent discussions with Dr. Thomasenia Sales deemed an appropriate candidate for hepatic Y 90 treatment and he underwent right hepatic lobe treatment on 03/21/20.  He presents again today for left hepatic lobe Y 90 radioembolization.  He currently denies fever, headache, chest pain, worsening dyspnea, cough, abdominal/back pain, nausea, vomiting or bleeding.  He does have some peripheral neuropathy.  Past Medical History:  Diagnosis Date  . Abnormal MRI of abdomen, liver  04/21/2014  . Anxiety   . Cirrhosis, alcoholic ([place]) 123456  . Claudication of left lower extremity (HCC) m-1  . Depression   . [place] (hepatocellular carcinoma) ([place])   . Hepatitis C   . Hypertension   . Liver mass   . Panic attack    Past Surgical History:  Procedure Laterality Date  . ABDOMINAL AORTAGRAM N/A 12/27/2013   Procedure: ABDOMINAL Maxcine Ham;  Surgeon: Conrad [place], MD;  Location: [facility] CATH LAB;  Service: Cardiovascular;  Laterality: N/A;  . AORTOGRAM    . COSMETIC SURGERY     facial  reconstructive surgery  . ESOPHAGOGASTRODUODENOSCOPY N/A 12/17/2014   Procedure: ESOPHAGOGASTRODUODENOSCOPY (EGD);  Surgeon: Missy Sabins, MD;  Location: [facility] ENDOSCOPY;  Service: Endoscopy;  Laterality: N/A;  . ESOPHAGOGASTRODUODENOSCOPY N/A 09/05/2015   Procedure: ESOPHAGOGASTRODUODENOSCOPY (EGD);  Surgeon: Wonda Horner, MD;  Location: [facility] ENDOSCOPY;  Service: Endoscopy;  Laterality: N/A;  . IR ANGIOGRAM EXTREMITY RIGHT  03/21/2020  . IR ANGIOGRAM SELECTIVE EACH ADDITIONAL VESSEL  03/10/2020  . IR ANGIOGRAM SELECTIVE EACH ADDITIONAL VESSEL  03/10/2020  . IR ANGIOGRAM SELECTIVE EACH ADDITIONAL VESSEL  03/10/2020  . IR ANGIOGRAM SELECTIVE EACH ADDITIONAL VESSEL  03/10/2020  . IR ANGIOGRAM SELECTIVE EACH ADDITIONAL VESSEL  03/10/2020  . IR ANGIOGRAM SELECTIVE EACH ADDITIONAL VESSEL  03/21/2020  . IR ANGIOGRAM SELECTIVE EACH ADDITIONAL VESSEL  03/21/2020  . IR ANGIOGRAM SELECTIVE EACH ADDITIONAL VESSEL  03/21/2020  . IR ANGIOGRAM SELECTIVE EACH ADDITIONAL VESSEL  03/21/2020  . IR ANGIOGRAM SELECTIVE EACH ADDITIONAL VESSEL  03/21/2020  . IR ANGIOGRAM VISCERAL SELECTIVE  03/10/2020  . IR ANGIOGRAM VISCERAL SELECTIVE  03/21/2020  . IR EMBO ARTERIAL NOT HEMORR HEMANG INC GUIDE ROADMAPPING  03/10/2020  . IR EMBO TUMOR ORGAN ISCHEMIA INFARCT INC GUIDE ROADMAPPING  03/21/2020  . IR GENERIC HISTORICAL  01/19/2015   IR RADIOLOGIST EVAL & MGMT 01/19/2015 Jacqulynn Cadet, MD [facility] INTERV RAD  . IR GENERIC HISTORICAL  07/23/2016   IR RADIOLOGIST EVAL & MGMT 07/23/2016 Jacqulynn Cadet, MD [facility] INTERV RAD  . IR GENERIC HISTORICAL  05/30/2015   IR RADIOLOGIST EVAL & MGMT 05/30/2015 [facility] INTERV RAD  . IR GENERIC HISTORICAL  10/04/2014   IR RADIOLOGIST EVAL & MGMT 10/04/2014 Jacqulynn Cadet, MD [facility] INTERV RAD  . IR  GENERIC HISTORICAL  02/11/2017   IR RADIOLOGIST EVAL & MGMT 02/11/2017 Jacqulynn Cadet, MD [facility] INTERV RAD  . IR RADIOLOGIST EVAL & MGMT  08/13/2017  . IR RADIOLOGIST EVAL & MGMT  08/11/2018  . IR RADIOLOGIST EVAL &  MGMT  02/09/2019  . IR RADIOLOGIST EVAL & MGMT  02/10/2020  . IR US GUIDE VASC ACCESS LEFT  03/10/2020  . IR US GUIDE VASC ACCESS LEFT  03/21/2020  . IR US GUIDE VASC ACCESS RIGHT  03/21/2020  . LOWER EXTREMITY ANGIOGRAM Bilateral 12/27/2013   Procedure: LOWER EXTREMITY ANGIOGRAM;  Surgeon: Conrad [place], MD;  Location: [facility] CATH LAB;  Service: Cardiovascular;  Laterality: Bilateral;      Allergies: Folic acid and Oxycodone  Medications: Prior to Admission medications   Medication Sig Start Date End Date Taking? Authorizing Provider  albuterol (VENTOLIN HFA) 108 (90 Base) MCG/ACT inhaler Inhale 2 puffs into the lungs every 6 (six) hours as needed for wheezing or shortness of breath. 11/26/19   Azzie Glatter, FNP  ALPRAZolam Duanne Moron) 0.5 MG tablet Take 0.5 mg by mouth 2 (two) times daily.     [provider]  bisacodyl (DULCOLAX) 5 MG EC tablet Take 5 mg by mouth daily as needed for mild constipation or moderate constipation. Reported on 04/03/2016    [provider]  diazepam (VALIUM) 5 MG tablet Take 5 mg by mouth every 6 (six) hours as needed for anxiety.    [provider]  FEROSUL 325 (65 Fe) MG tablet TAKE 1 TABLET(325 MG) BY MOUTH DAILY WITH BREAKFAST Patient not taking: Reported on 06/17/2018 11/10/17   Scot Jun, FNP  FLUoxetine (PROZAC) 40 MG capsule Take 1 capsule (40 mg total) by mouth every morning. 11/26/19   Azzie Glatter, FNP  furosemide (LASIX) 20 MG tablet TAKE 1 TABLET(20 MG) BY MOUTH DAILY 11/29/19   Azzie Glatter, FNP  furosemide (LASIX) 20 MG tablet TAKE 1 TABLET BY MOUTH EVERY MORNING 11/26/19   Azzie Glatter, FNP  gabapentin (NEURONTIN) 100 MG capsule Take 1 capsule, by mouth 3 times a day. 11/26/19   Azzie Glatter, FNP  hydrOXYzine (ATARAX/VISTARIL) 10 MG tablet Take 1 tablet (10 mg total) by mouth 3 (three) times daily as needed. 11/26/19   Azzie Glatter, FNP  pantoprazole (PROTONIX) 40 MG tablet TAKE 1 TABLET(40 MG) BY  MOUTH TWICE DAILY BEFORE A MEAL 11/26/19   Azzie Glatter, FNP  spironolactone (ALDACTONE) 25 MG tablet TAKE 3 TABLETS(75 MG) BY MOUTH DAILY 04/03/20   Azzie Glatter, FNP  vitamin C (ASCORBIC ACID) 250 MG tablet Take 500 mg by mouth daily.    [provider]     Vital Signs: Blood pressure 154/90, temp 98.2, heart rate 58, respirations 17, O2 sat 100% room air   Physical Exam awake, alert.  Chest clear to auscultation bilaterally.  Heart with slightly bradycardic but regular rhythm.  Abdomen soft, slight distention, positive bowel sounds, currently nontender.  No significant lower extremity edema.  Imaging: No results found.  Labs:  CBC: Recent Labs    11/26/19 1213 01/24/20 1230 03/10/20 0810 03/21/20 0812  WBC 3.6 3.9 3.3* 3.3*  HGB 16.0 16.1 15.7 14.8  HCT 48.4 48.1 47.5 45.4  PLT 44* 58* PLATELET CLUMPS NOTED ON SMEAR, COUNT APPEARS DECREASED 62*    COAGS: Recent Labs    01/24/20 1230 03/10/20 0810  INR 1.1 1.2    BMP: Recent Labs    01/24/20 1230 03/10/20 0810 03/21/20  KG:[id number]  NA 137 133* 137  K 4.4 3.2* 4.3  CL 99 101 103  CO2 29 22 25   GLUCOSE 108 103* 99  BUN 11 13 12   CALCIUM 9.3 8.6* 9.0  CREATININE 1.02 1.04 0.86  GFRNONAA 77 >60 >60  GFRAA 90 >60 >60    LIVER FUNCTION TESTS: Recent Labs    01/24/20 1230 03/10/20 0810 03/21/20 0812  BILITOT 1.2 1.2 1.0  AST 22 23 24   ALT 19 18 18   ALKPHOS  --  77 86  PROT 7.3 7.1 6.7  ALBUMIN  --  4.0 3.6    Assessment and Plan: 63 y.o.malewithalcoholic and HCV cirrhosis complicated by hepatocellular carcinoma, ascites and esophageal varices. He was treated by combination drug-eluting bead transarterial chemoembolization followed by microwave thermal ablation on 6/25 and 06/10/2014.His most recent MRI demonstrates significant interval progression of disease with new multifocal hepatocellular carcinoma. He now has disease in both the right and left liver including hepatic segments 7, 6  and 2. His largest lesion is now over 4 cm and he has more than 3 lesions. This effectively places him outside of the [place] criteria and excludes him from possible liver transplantation.His latest AFP in March of this year was 2.4, AFP 867.Following recent discussions with Dr. Thomasenia Sales deemed an appropriate candidate for hepatic Y 90 treatment and he underwent right hepatic lobe treatment on 03/21/20.  He presents again today for left hepatic lobe Y 90 radioembolization. Risks and benefits of procedure were discussed with the patient including, but not limited to bleeding, infection, vascular injury or contrast induced renal failure.  This interventional procedure involves the use of X-rays and because of the nature of the planned procedure, it is possible that we will have prolonged use of X-ray fluoroscopy.  Potential radiation risks to you include (but are not limited to) the following: - A slightly elevated risk for cancer  several years later in life. This risk is typically less than 0.5% percent. This risk is low in comparison to the normal incidence of human cancer, which is 33% for women and 50% for men according to the [place]. - Radiation induced injury can include skin redness, resembling a rash, tissue breakdown / ulcers and hair loss (which can be temporary or permanent).   The likelihood of either of these occurring depends on the difficulty of the procedure and whether you are sensitive to radiation due to previous procedures, disease, or genetic conditions.   IF your procedure requires a prolonged use of radiation, you will be notified and given written instructions for further action.  It is your responsibility to monitor the irradiated area for the 2 weeks following the procedure and to notify your physician if you are concerned that you have suffered a radiation induced injury.    All of the patient's questions were answered, patient is agreeable to  proceed.  Consent signed and in chart.     Electronically Signed: D. Rowe Robert, PA-C 04/18/2020, 8:28 AM   I spent a total of 30 minutes at the the patient's bedside AND on the patient's hospital floor or unit, greater than 50% of which was counseling/coordinating care for hepatic arteriogram with left hepatic lobe Y-90 radioembolization

## 2020-04-18 NOTE — Progress Notes (Addendum)
Actual arrival time 1204 from IR to short stay.

## 2020-04-18 NOTE — Sedation Documentation (Signed)
In nuc med for study ?

## 2020-04-18 NOTE — Discharge Instructions (Signed)
Post Y-90 Radioembolization Discharge Instructions  You have been given a radioactive material during your procedure.  While it is safe for you to be discharged home from the hospital, you need to proceed directly home.    Do not use public transportation, including air travel, lasting more than 2 hours for 1 week.  Avoid crowded public places for 1 week.  Adult visitors should try to avoid close contact with you for 1 week.    Children and pregnant females should not visit or have close contact with you for 1 week.  Items that you touch are not radioactive.  Do not sleep in the same bed as your partner for 1 week, and a condom should be used for sexual activity during the first 24 hours.  Your blood may be radioactive and caution should be used if any bleeding occurs during the recovery period.  Body fluids may be radioactive for 24 hours.  Wash your hands after voiding.  Men should sit to urinate.  Dispose of any soiled materials (flush down toilet or place in trash at home) during the first day.  Drink 6 to 8 glasses of fluids per day for 5 days to hydrate yourself.  If you need to see a doctor during the first week, you must let them know that you were treated with yttrium-90 microspheres, and will be slightly radioactive.  They can call Interventional Radiology [phone] with any questions.   Hepatic Artery Radioembolization, Care After This sheet gives you information about how to care for yourself after your procedure. Your health care provider may also give you more specific instructions. If you have problems or questions, contact your health care provider. What can I expect after the procedure? After the procedure, it is common to have:  A slight fever for 1-2 weeks. If your fever gets worse, tell your health care provider.  Fatigue.  Loss of appetite. This should gradually improve after about 1 week.  Abdominal pain on your right side.  Soreness and tenderness in your  groin area where the needle and catheter were placed (puncture site). Follow these instructions at home:  Puncture site care  Follow instructions from your health care provider about how to take care of the puncture site. Make sure you: ? Wash your hands with soap and water before you change your bandage (dressing). If soap and water are not available, use hand sanitizer. ? Change your dressing as told by your health care provider. ? Leave stitches (sutures), skin glue, or adhesive strips in place. These skin closures may need to stay in place for 2 weeks or longer. If adhesive strip edges start to loosen and curl up, you may trim the loose edges. Do not remove adhesive strips completely unless your health care provider tells you to do that.  Check your puncture site every day for signs of infection. Check for: ? More redness, swelling, or pain. ? More fluid or blood. ? Warmth. ? Pus or a bad smell. Activity  Rest and return to your normal activities as told by your health care provider. Ask your health care provider what activities are safe for you.  Do not drive for 24 hours after the procedure if you were given a medicine to help you relax (sedative).  Do not lift anything that is heavier than 10 lb (4.5 kg) until your health care provider says that it is safe. Medicines  Take over-the-counter and prescription medicines only as told by your health care provider.  Do not drive or use heavy machinery while taking prescription pain medicine. Radiation precautions  For up to a week after your procedure, there will be a small amount of radioactivity near your liver. This is not especially dangerous to other people. However, you should follow these precautions for 7 days: ? Do not come in close contact with people. ? Do not sleep in the same bed as someone else. ? Do not hold children or babies. ? Do not have contact with pregnant women. General instructions  To prevent or treat  constipation while you are taking prescription pain medicine, your health care provider may recommend that you: ? Drink enough fluid to keep your urine clear or pale yellow. ? Take over-the-counter or prescription medicines. ? Eat foods that are high in fiber, such as fresh fruits and vegetables, whole grains, and beans. ? Limit foods that are high in fat and processed sugars, such as fried and sweet foods.  Eat frequent small meals until your appetite returns. Follow instructions from your health care provider about eating or drinking restrictions.  Do not take baths, swim, or use a hot tub until your health care provider approves. You may take showers. Wash your puncture site with mild soap and water and pat the area dry.  Wear compression stockings as told by your health care provider. These stockings help to prevent blood clots and reduce swelling in your legs.  Keep all follow-up visits as told by your health care provider. This is important. You may need to have blood tests and imaging tests done. Contact a health care provider if:  You have more redness, swelling, or pain around your puncture site.  You have more fluid or blood coming from your puncture site.  Your puncture site feels warm to the touch.  You have pus or a bad smell coming from your puncture site.  You have pain that: ? Gets worse. ? Does not get better with medicine. ? Feels like very bad heartburn. ? Is in the middle of your abdomen, above your belly button.  Your skin and the white parts of your eyes turn yellow (jaundice).  The color of your urine changes to dark brown.  The color of your stool changes to light yellow.  Your abdominal measurement (girth) increases in a short period of time.  You gain more than 5 lb (2.3 kg) in a short period of time. Get help right away if:  You have a fever that lasts longer than 2 weeks or is higher than what your health care provider told you to expect.  You  develop any of the following in your legs: ? Pain. ? Swelling. ? Skin that is cold or pale or turns blue.  You have chest pain.  You have blood in your vomit, saliva, or stool.  You have trouble breathing. This information is not intended to replace advice given to you by your health care provider. Make sure you discuss any questions you have with your health care provider. Document Revised: 11/14/2017 Document Reviewed: 08/31/2016 Elsevier Patient Education  [place].   Moderate Conscious Sedation, Adult, Care After These instructions provide you with information about caring for yourself after your procedure. Your health care provider may also give you more specific instructions. Your treatment has been planned according to current medical practices, but problems sometimes occur. Call your health care provider if you have any problems or questions after your procedure. What can I expect after the procedure? After your  procedure, it is common:  To feel sleepy for several hours.  To feel clumsy and have poor balance for several hours.  To have poor judgment for several hours.  To vomit if you eat too soon. Follow these instructions at home: For at least 24 hours after the procedure:   Do not: ? Participate in activities where you could fall or become injured. ? Drive. ? Use heavy machinery. ? Drink alcohol. ? Take sleeping pills or medicines that cause drowsiness. ? Make important decisions or sign legal documents. ? Take care of children on your own.  Rest. Eating and drinking  Follow the diet recommended by your health care provider.  If you vomit: ? Drink water, juice, or soup when you can drink without vomiting. ? Make sure you have little or no nausea before eating solid foods. General instructions  Have a responsible adult stay with you until you are awake and alert.  Take over-the-counter and prescription medicines only as told by your health care  provider.  If you smoke, do not smoke without supervision.  Keep all follow-up visits as told by your health care provider. This is important. Contact a health care provider if:  You keep feeling nauseous or you keep vomiting.  You feel light-headed.  You develop a rash.  You have a fever. Get help right away if:  You have trouble breathing. This information is not intended to replace advice given to you by your health care provider. Make sure you discuss any questions you have with your health care provider. Document Revised: 11/14/2017 Document Reviewed: 03/23/2016 Elsevier Patient Education  2020 [employer].

## 2020-04-18 NOTE — Telephone Encounter (Signed)
Lmom for Seth Bake at Dr. Burr Medico office regarding referral for Mr. Rando

## 2020-04-19 ENCOUNTER — Telehealth: Payer: Self-pay | Admitting: Hematology

## 2020-04-19 NOTE — Telephone Encounter (Signed)
Received a new pt referral from [name] for hepatocellular carcinoma. Jeffrey Snow has been cld and scheduled to see Dr. Burr Medico on 5/10 at 3pm. I lft the appt date and time on the pt's voicemail. I notified Jocelyn Lamer from [facility] of the appt and update GI navigator.

## 2020-04-20 NOTE — Progress Notes (Signed)
Patient arrived to Short Stay for Y 90 procedure with multiple questions and concerns.  Dr. Laurence Ferrari spent 20-30 minutes in discussion with patient prior to procedure.  Dr. Laurence Ferrari advised RN pre procedure medications could be given in radiology prior to procedure instead of Short Stay.

## 2020-04-20 NOTE — Progress Notes (Signed)
Patient calls regarding upcoming appointment with Dr. Burr Medico on Monday 5/10 at 3 pm.  I explained to him we received a referral from Dr. Geroge Baseman for medical oncology.  I explained to him he needs to arrive 15 minutes early to register.  He states he will call DSS to arrange transportation today.  He has a pain contract with [facility] on [address].  He complains about foot pain which has been evaluated and has been on gabapentin for a long while however sounds like he takes it intermittently.  He has been taking Oxycodone intermittently for this pain.  I explained to him that Dr. Burr Medico will not order any pain medication because he has a contract with another provider and encouraged him to reach out to their office.  He verbalized an understanding.   I spent 25 minutes on the phone with this patient.  All his questions were answered.

## 2020-04-21 NOTE — Progress Notes (Signed)
[facility]   [phone] [phone]   Clinic New Consult Note   Patient Care Team: Azzie Glatter, FNP as PCP - General (Family Medicine) Truitt Merle, MD as Consulting Physician (Hematology) Jonnie Finner, RN as Oncology Nurse Navigator  Date of Service:  04/24/2020   CHIEF COMPLAINTS/PURPOSE OF CONSULTATION:  Recurrent HCC   REFERRING PHYSICIAN:  Dr Geroge Baseman  Oncology History Overview Note  Cancer Staging No matching staging information was found for the patient.    [place] (hepatocellular carcinoma) ([place])  04/25/2014 Imaging   MRI Abdomen 04/25/14 - DIAGNOSED IMPRESSION: 3.2 cm enhancing lesion in the posterior segment right hepatic lobe, increased, highly suspicious for hepatocellular carcinoma.   Cirrhosis with splenomegaly and small volume abdominal ascites. Portal vein is patent.   Cholelithiasis with suspected secondary gallbladder wall thickening/edema. If there is clinical concern for cholecystitis, consider hepatobiliary nuclear medicine scan.   06/09/2014 Initial Diagnosis   HCC (hepatocellular carcinoma) ([place])   06/09/2014 Procedure   He was treated by combination drug-eluting bead transarterial chemoembolization followed by microwave thermal ablation on 6/25 and 06/10/2014. Treated by Dr Geroge Baseman    07/11/2014 Imaging   MRI abdomen 07/11/14  IMPRESSION: 1. Status post normal ablation of right hepatic lobe a hepatoma. There is no evidence for residual/recurrent enhancing tissue within the ablation site. 2. Cirrhosis and stigmata of portal venous hypertension.     Imaging   Mostly Stable MRIs from 10/03/14 to 02/09/19   02/09/2019 Imaging   MRI Abdomen  IMPRESSION: 1. LI-RADS category LR-3 lesion in segment 7 of the liver and separate LI-RADS category LR-3 lesion in segment 6 of the liver, both essentially stable in size from 08/11/2018. Surveillance imaging is suggested. 2. Hepatic cirrhosis with splenorenal  shunting indicating portal venous hypertension. 3. No recurrence along the ablation site itself. 4. Multiple gallstones. 5.  Aortic Atherosclerosis (ICD10-I70.0). 6. Splenomegaly.   02/01/2020 Imaging   MRI abdomen 02/01/20   IMPRESSION: 1. Recurrent multifocal hepatocellular carcinoma with significant growth of segment 7 right liver dome tumor, new tumor at the previous ablation defect in segment 7, enlarging segment 6 tumor and new segment 2 tumor. 2. Cirrhosis. Mild-to-moderate splenomegaly. Small paraumbilical and perisplenic varices. No ascites. 3. Cholelithiasis.  No biliary ductal dilatation. 4.  Aortic Atherosclerosis (ICD10-I70.0).   03/21/2020 Procedure   He was treated with Y90 Radio embolizatino on 03/21/20 and 04/18/20      HISTORY OF PRESENTING ILLNESS:  Jeffrey Snow 64 y.o. male is a here because of recurrent [place]. The patient was referred by Dr Geroge Baseman. The patient presents to the clinic today alone. He was brought here by car service.   He was following Dr Geroge Baseman for 6 years for liver cirrhosis after initial cancer diagnosis. He notes he cancer recurrence with elevated tumor marker in 01/2020. He notes after most recent Y90 he felt sick. He notes today he feels better. He notes his appetite is normal and no true appetite lately.   He notes he is single with 3 daughters in [place] and the other in [place]. He notes he quit drinking alcohol 10-15 years ago after drinking 8-10 beers a day. He also quit smoking at the same time after smoking for 35 years. He was negative for lung cancer screening. 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 He notes he is able to do everything himself. He notes he mostly eats out.   They have a PMHx of liver cirrhosis diagnosed about 10 years ago by  Dr Jimmye Norman. He was found to have Hep C sometime after that and was treated with Harvoni by Dr Amedeo Plenty. He was seen by [facility] about possible liver transplant, but not  found to be eligible. 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 He is being seen by pain management specialist at [facility] Dr. Antonietta Jewel. He was given Oxycodone 1 tab that can last him a few days, but latest refill was not given because foot Xray was recommended.  He does not much help from 112m Gabapentin TID. I reviewed his medication list with him.  He had plastic surgery of face for decompressed check bone.    REVIEW OF SYSTEMS:    Constitutional: Denies fevers, chills or abnormal night sweats Eyes: Denies blurriness of vision, double vision or watery eyes Ears, nose, mouth, throat, and face: Denies mucositis or sore throat Respiratory: Denies cough, dyspnea or wheezes Cardiovascular: Denies palpitation, chest discomfort or lower extremity swelling Gastrointestinal:  Denies nausea, heartburn or change in bowel habits Skin: Denies abnormal skin rashes Lymphatics: Denies new lymphadenopathy or easy bruising Neurological: (+) Neuropathy in right foot  Behavioral/Psych: Mood is stable, no new changes  All other systems were reviewed with the patient and are negative.   MEDICAL HISTORY:  Past Medical History:  Diagnosis Date  . Abnormal MRI of abdomen, liver  04/21/2014  . Anxiety   . Cirrhosis, alcoholic ([place] 59/06/9891 . Claudication of left lower extremity (HCC) m-1  . Depression   . [place]   . Hepatitis C   . Hypertension   . Liver mass   . Panic attack     SURGICAL HISTORY: Past Surgical History:  Procedure Laterality Date  . ABDOMINAL AORTAGRAM N/A 12/27/2013   Procedure: ABDOMINAL AMaxcine Ham  Surgeon: BConrad Pea Ridge MD;  [facility];  Service: Cardiovascular;  Laterality: N/A;  . AORTOGRAM    . COSMETIC SURGERY     facial reconstructive surgery  . ESOPHAGOGASTRODUODENOSCOPY N/A 12/17/2014   Procedure: ESOPHAGOGASTRODUODENOSCOPY (EGD);  Surgeon: JMissy Sabins MD;  [facility];  Service: Endoscopy;  Laterality: N/A;  .  ESOPHAGOGASTRODUODENOSCOPY N/A 09/05/2015   Procedure: ESOPHAGOGASTRODUODENOSCOPY (EGD);  Surgeon: SWonda Horner MD;  [facility];  Service: Endoscopy;  Laterality: N/A;  . IR ANGIOGRAM EXTREMITY RIGHT  03/21/2020  . IR ANGIOGRAM SELECTIVE EACH ADDITIONAL VESSEL  03/10/2020  . IR ANGIOGRAM SELECTIVE EACH ADDITIONAL VESSEL  03/10/2020  . IR ANGIOGRAM SELECTIVE EACH ADDITIONAL VESSEL  03/10/2020  . IR ANGIOGRAM SELECTIVE EACH ADDITIONAL VESSEL  03/10/2020  . IR ANGIOGRAM SELECTIVE EACH ADDITIONAL VESSEL  03/10/2020  . IR ANGIOGRAM SELECTIVE EACH ADDITIONAL VESSEL  03/21/2020  . IR ANGIOGRAM SELECTIVE EACH ADDITIONAL VESSEL  03/21/2020  . IR ANGIOGRAM SELECTIVE EACH ADDITIONAL VESSEL  03/21/2020  . IR ANGIOGRAM SELECTIVE EACH ADDITIONAL VESSEL  03/21/2020  . IR ANGIOGRAM SELECTIVE EACH ADDITIONAL VESSEL  03/21/2020  . IR ANGIOGRAM SELECTIVE EACH ADDITIONAL VESSEL  04/18/2020  . IR ANGIOGRAM SELECTIVE EACH ADDITIONAL VESSEL  04/18/2020  . IR ANGIOGRAM VISCERAL SELECTIVE  03/10/2020  . IR ANGIOGRAM VISCERAL SELECTIVE  03/21/2020  . IR ANGIOGRAM VISCERAL SELECTIVE  04/18/2020  . IR EMBO ARTERIAL NOT HEMORR HEMANG INC GUIDE ROADMAPPING  03/10/2020  . IR EMBO TUMOR ORGAN ISCHEMIA INFARCT INC GUIDE ROADMAPPING  03/21/2020  . IR EMBO TUMOR ORGAN ISCHEMIA INFARCT INC GUIDE ROADMAPPING  04/18/2020  . IR GENERIC HISTORICAL  01/19/2015   IR RADIOLOGIST EVAL & MGMT 01/19/2015 HJacqulynn Cadet MD [facility] INTERV  RAD  . IR GENERIC HISTORICAL  07/23/2016   IR RADIOLOGIST EVAL & MGMT 07/23/2016 Jacqulynn Cadet, MD [facility] INTERV RAD  . IR GENERIC HISTORICAL  05/30/2015   IR RADIOLOGIST EVAL & MGMT 05/30/2015 [facility] INTERV RAD  . IR GENERIC HISTORICAL  10/04/2014   IR RADIOLOGIST EVAL & MGMT 10/04/2014 Jacqulynn Cadet, MD [facility] INTERV RAD  . IR GENERIC HISTORICAL  02/11/2017   IR RADIOLOGIST EVAL & MGMT 02/11/2017 Jacqulynn Cadet, MD [facility] INTERV RAD  . IR RADIOLOGIST EVAL & MGMT  08/13/2017  . IR RADIOLOGIST EVAL & MGMT  08/11/2018    . IR RADIOLOGIST EVAL & MGMT  02/09/2019  . IR RADIOLOGIST EVAL & MGMT  02/10/2020  . IR US GUIDE VASC ACCESS LEFT  03/10/2020  . IR US GUIDE VASC ACCESS LEFT  03/21/2020  . IR US GUIDE VASC ACCESS LEFT  04/18/2020  . IR US GUIDE VASC ACCESS RIGHT  03/21/2020  . LOWER EXTREMITY ANGIOGRAM Bilateral 12/27/2013   Procedure: LOWER EXTREMITY ANGIOGRAM;  Surgeon: Conrad [place], MD;  [facility];  Service: Cardiovascular;  Laterality: Bilateral;    SOCIAL HISTORY: Social History   Socioeconomic History  . Marital status: Single    Spouse name: Not on file  . Number of children: 3  . Years of education: Not on file  . Highest education level: Not on file  Occupational History    Employer: [employer]  Tobacco Use  . Smoking status: Former Smoker    Packs/day: 0.50    Quit date: 08/21/2011    Years since quitting: 8.6  . Smokeless tobacco: Never Used  Substance and Sexual Activity  . Alcohol use: No    Alcohol/week: 0.0 standard drinks    Comment: Occasional beer on the weekends  . Drug use: No    Types: Cocaine    Comment: abused cocaine in the 80's  . Sexual activity: Not Currently  Other Topics Concern  . Not on file  Social History Narrative   Single, never married.  Lives alone.  3 daughters, 16 grandchildren who are not very involved in the patient's care.  Quit smoking and heavy drinking 3.5 years ago.    Social Determinants of Health   Financial Resource Strain:   . Difficulty of Paying Living Expenses:   Food Insecurity:   . Worried About Charity fundraiser in the Last Year:   . Arboriculturist in the Last Year:   Transportation Needs:   . Film/video editor (Medical):   Marland Kitchen Lack of Transportation (Non-Medical):   Physical Activity:   . Days of Exercise per Week:   . Minutes of Exercise per Session:   Stress:   . Feeling of Stress :   Social Connections:   . Frequency of Communication with Friends and Family:   . Frequency of Social Gatherings with Friends  and Family:   . Attends Religious Services:   . Active Member of Clubs or Organizations:   . Attends Archivist Meetings:   Marland Kitchen Marital Status:   Intimate Partner Violence:   . Fear of Current or Ex-Partner:   . Emotionally Abused:   Marland Kitchen Physically Abused:   . Sexually Abused:     FAMILY HISTORY: Family History  Problem Relation Age of Onset  . Diabetes Father   . Diabetes Brother   . Diabetes Brother     ALLERGIES:  is allergic to folic acid and oxycodone.  MEDICATIONS:  Current Outpatient Medications  Medication  Sig Dispense Refill  . albuterol (VENTOLIN HFA) 108 (90 Base) MCG/ACT inhaler Inhale 2 puffs into the lungs every 6 (six) hours as needed for wheezing or shortness of breath. 8 g 11  . ALPRAZolam (XANAX) 0.5 MG tablet Take 0.5 mg by mouth 2 (two) times daily.     . bisacodyl (DULCOLAX) 5 MG EC tablet Take 5 mg by mouth daily as needed for mild constipation or moderate constipation. Reported on 04/03/2016    . furosemide (LASIX) 20 MG tablet TAKE 1 TABLET(20 MG) BY MOUTH DAILY 90 tablet 0  . furosemide (LASIX) 20 MG tablet TAKE 1 TABLET BY MOUTH EVERY MORNING 90 tablet 1  . gabapentin (NEURONTIN) 100 MG capsule Take 1 capsule, by mouth 3 times a day. 90 capsule 6  . hydrOXYzine (ATARAX/VISTARIL) 10 MG tablet Take 1 tablet (10 mg total) by mouth 3 (three) times daily as needed. 90 tablet 6  . pantoprazole (PROTONIX) 40 MG tablet TAKE 1 TABLET(40 MG) BY MOUTH TWICE DAILY BEFORE A MEAL 180 tablet 1  . spironolactone (ALDACTONE) 25 MG tablet TAKE 3 TABLETS(75 MG) BY MOUTH DAILY 90 tablet 6  . vitamin C (ASCORBIC ACID) 250 MG tablet Take 500 mg by mouth daily.     No current facility-administered medications for this visit.    PHYSICAL EXAMINATION: ECOG PERFORMANCE STATUS: 0 - Asymptomatic  Vitals:   04/24/20 1524  BP: (!) 182/77  Pulse: 65  Resp: 20  Temp: 98.9 F (37.2 C)  SpO2: 96%   Filed Weights   04/24/20 1524  Weight: 181 lb (82.1 kg)     GENERAL:alert, no distress and comfortable SKIN: skin color, texture, turgor are normal, no rashes or significant lesions EYES: normal, Conjunctiva are pink and non-injected, sclera clear  NECK: supple, thyroid normal size, non-tender, without nodularity LYMPH:  no palpable lymphadenopathy in the cervical, axillary  LUNGS: clear to auscultation and percussion with normal breathing effort HEART: regular rate & rhythm and no murmurs and no lower extremity edema ABDOMEN:abdomen soft, non-tender and normal bowel sounds Musculoskeletal:no cyanosis of digits and no clubbing  NEURO: alert & oriented x 3 with fluent speech, no focal motor/sensory deficits  LABORATORY DATA:  I have reviewed the data as listed CBC Latest Ref Rng & Units 04/18/2020 03/21/2020 03/10/2020  WBC 4.0 - 10.5 K/uL 4.6 3.3(L) 3.3(L)  Hemoglobin 13.0 - 17.0 g/dL 15.6 14.8 15.7  Hematocrit 39.0 - 52.0 % 47.8 45.4 47.5  Platelets 150 - 400 K/uL 61(L) 62(L) PLATELET CLUMPS NOTED ON SMEAR, COUNT APPEARS DECREASED    CMP Latest Ref Rng & Units 04/18/2020 03/21/2020 03/10/2020  Glucose 70 - 99 mg/dL 104(H) 99 103(H)  BUN 8 - 23 mg/dL _0 Creatinine 0.61 - 1.24 mg/dL 0.88 0.86 1.04  Sodium 135 - 145 mmol/L 138 137 133(L)  Potassium 3.5 - 5.1 mmol/L 4.3 4.3 3.2(L)  Chloride 98 - 111 mmol/L 107 103 101  CO2 22 - 32 mmol/L _1 Calcium 8.9 - 10.3 mg/dL 8.9 9.0 8.6(L)  Total Protein 6.5 - 8.1 g/dL 7.1 6.7 7.1  Total Bilirubin 0.3 - 1.2 mg/dL 1.1 1.0 1.2  Alkaline Phos 38 - 126 U/L 132(H) 86 77  AST 15 - 41 U/L 33 24 23  ALT 0 - 44 U/L _2 RADIOGRAPHIC STUDIES: I have personally reviewed the radiological images as listed and agreed with the findings in the report. NM LIVER TUMOR LOC IMFLAM SPECT 1 DAY  Result Date:  04/18/2020 CLINICAL DATA:  Recurrent hepatocellular carcinoma. First treatment to left lobe of liver EXAM: NUCLEAR MEDICINE SPECIAL MED RAD PHYSICS CONS; NUCLEAR MEDICINE RADIO PHARM THERAPY INTRA  ARTERIAL; NUCLEAR MEDICINE TREATMENT PROCEDURE; NUCLEAR MEDICINE LIVER SCAN TECHNIQUE: In conjunction with the interventional radiologist a Y- Microsphere dose was calculated utilizing body surface area formulation. Calculated dose equal 16 mCi. Pre therapy MAA liver SPECT scan and CTA were evaluated. Utilizing a microcatheter system, the hepatic artery was selected and Y-90 microspheres were delivered in fractionated aliquots. Radiopharmaceutical was delivered by the interventional radiologist and nuclear radiologist. The patient tolerated procedure well. No adverse effects were noted. Bremsstrahlung planar and SPECT imaging of the abdomen following intrahepatic arterial delivery of Y-90 microsphere was performed. RADIOPHARMACEUTICALS:  14.40 MCI SIR-SPHERES Y-90 microspheres COMPARISON:  02/01/2020. FINDINGS: Y - 90 microspheres therapy as above. First therapy the left hepatic lobe. Bremsstrahlung planar and SPECT imaging of the abdomen following intrahepatic arterial delivery of Y-90 microsphere demonstrates radioactivity localized to the left hepatic lobe. No evidence of extrahepatic activity. IMPRESSION: Successful Y - 90 microsphere delivery for treatment of unresectable liver metastasis. First therapy to the left lobe. Bremssstrahlung scan demonstrates activity localized to left hepatic lobe with no extrahepatic activity identified. Electronically Signed   By: Kerby Moors M.D.   On: 04/18/2020 14:07   IR Angiogram Visceral Selective  Result Date: 04/18/2020 INDICATION: 64 year old male with multifocal hepatocellular carcinoma. He presents today for left segment two trans arterial radio embolization with Y 90. EXAM: 1. Ultrasound-guided vascular access left radial artery 2. Catheterization of the celiac artery with arteriogram 3. Catheterization of the common hepatic artery with arteriogram 4. Catheterization of the segment 2 hepatic artery with arteriogram 5. Trans arterial radio embolization  MEDICATIONS: 3.375 g Zosyn. 3000 units heparin, 400 mcg nitroglycerin and 5 mg verapamil. The antibiotic was administered within 1 hour of the procedure ANESTHESIA/SEDATION: Moderate (conscious) sedation was employed during this procedure. A total of Versed 2 mg and Fentanyl 150 mcg and 37.5 mg Benadryl was administered intravenously. Moderate Sedation Time: 46 minutes. The patient's level of consciousness and vital signs were monitored continuously by radiology nursing throughout the procedure under my direct supervision. CONTRAST:  40 mL Omnipaque 300 FLUOROSCOPY TIME:  Fluoroscopy Time: 4 minutes 36 seconds (436 mGy). COMPLICATIONS: None immediate. PROCEDURE: Informed consent was obtained from the patient following explanation of the procedure, risks, benefits and alternatives. The patient understands, agrees and consents for the procedure. All questions were addressed. A time out was performed prior to the initiation of the procedure. Maximal barrier sterile technique utilized including caps, mask, sterile gowns, sterile gloves, large sterile drape, hand hygiene, and Betadine prep. The left radial artery was interrogated with ultrasound and found to be widely patent and of adequate diameter. An image was obtained and stored for the medical record. Local anesthesia was attained by infiltration with 1% lidocaine. Under real-time sonographic guidance, the vessel was punctured with a 21 gauge micropuncture needle. Using standard technique, the initial micro needle was exchanged over a 0.021 micro wire for a hydrophilic 5 French slim arterial sheath. The sheath was flushed and then a radial cocktail consisting of 5000 units heparin, 2.5 mg Verapamil and 200 mcg nitroglycerine was injected through the sheath. An ultimate 1 catheter was advanced over a Bentson wire down the abdominal aorta. The catheter was used to select the celiac artery. A contrast injection was performed confirming patency of the celiac artery. The  ultimate 1 catheter was then advanced into the common hepatic artery  over a glidewire. Arteriography was performed confirming successful and persistent occlusion of the gastroduodenal artery and the right gastric artery. No interval development of a new gastric or duodenal collaterals. A high-flow Renegade microcatheter was then advanced over a Fathom 16 wire and used to select the segment 2 hepatic artery. Arteriography was performed confirming catheter position and also demonstrating hypervascular tumor blush in hepatic segment 2. The catheter was secured in place. Trans arterial radio embolization was then performed using resin Y 90 microspheres in the standard fashion. Spheres were administered in small aliquots and antegrade flow was assessed intermittently throughout the procedure and again following completion of the embolization. No evidence of stasis. The catheters were removed and disposed of appropriately. Hemostasis was attained with the assistance of a TR band. IMPRESSION: Successful segment 2 trans arterial radio embolization with Y 90 microspheres. Signed, Criselda Peaches, MD, [facility] Radiology Electronically Signed   By: Jacqulynn Cadet M.D.   On: 04/18/2020 11:52   IR Angiogram Selective Each Additional Vessel  Result Date: 04/18/2020 INDICATION: 64 year old male with multifocal hepatocellular carcinoma. He presents today for left segment two trans arterial radio embolization with Y 90. EXAM: 1. Ultrasound-guided vascular access left radial artery 2. Catheterization of the celiac artery with arteriogram 3. Catheterization of the common hepatic artery with arteriogram 4. Catheterization of the segment 2 hepatic artery with arteriogram 5. Trans arterial radio embolization MEDICATIONS: 3.375 g Zosyn. 3000 units heparin, 400 mcg nitroglycerin and 5 mg verapamil. The antibiotic was administered within 1 hour of the procedure  ANESTHESIA/SEDATION: Moderate (conscious) sedation was employed during this procedure. A total of Versed 2 mg and Fentanyl 150 mcg and 37.5 mg Benadryl was administered intravenously. Moderate Sedation Time: 46 minutes. The patient's level of consciousness and vital signs were monitored continuously by radiology nursing throughout the procedure under my direct supervision. CONTRAST:  40 mL Omnipaque 300 FLUOROSCOPY TIME:  Fluoroscopy Time: 4 minutes 36 seconds (436 mGy). COMPLICATIONS: None immediate. PROCEDURE: Informed consent was obtained from the patient following explanation of the procedure, risks, benefits and alternatives. The patient understands, agrees and consents for the procedure. All questions were addressed. A time out was performed prior to the initiation of the procedure. Maximal barrier sterile technique utilized including caps, mask, sterile gowns, sterile gloves, large sterile drape, hand hygiene, and Betadine prep. The left radial artery was interrogated with ultrasound and found to be widely patent and of adequate diameter. An image was obtained and stored for the medical record. Local anesthesia was attained by infiltration with 1% lidocaine. Under real-time sonographic guidance, the vessel was punctured with a 21 gauge micropuncture needle. Using standard technique, the initial micro needle was exchanged over a 0.021 micro wire for a hydrophilic 5 French slim arterial sheath. The sheath was flushed and then a radial cocktail consisting of 5000 units heparin, 2.5 mg Verapamil and 200 mcg nitroglycerine was injected through the sheath. An ultimate 1 catheter was advanced over a Bentson wire down the abdominal aorta. The catheter was used to select the celiac artery. A contrast injection was performed confirming patency of the celiac artery. The ultimate 1 catheter was then advanced into the common hepatic artery over a glidewire. Arteriography was performed confirming successful and persistent  occlusion of the gastroduodenal artery and the right gastric artery. No interval development of a new gastric or duodenal collaterals. A high-flow Renegade microcatheter was then advanced over a Fathom 16 wire and used to select the segment 2 hepatic  artery. Arteriography was performed confirming catheter position and also demonstrating hypervascular tumor blush in hepatic segment 2. The catheter was secured in place. Trans arterial radio embolization was then performed using resin Y 90 microspheres in the standard fashion. Spheres were administered in small aliquots and antegrade flow was assessed intermittently throughout the procedure and again following completion of the embolization. No evidence of stasis. The catheters were removed and disposed of appropriately. Hemostasis was attained with the assistance of a TR band. IMPRESSION: Successful segment 2 trans arterial radio embolization with Y 90 microspheres. Signed, Criselda Peaches, MD, [facility] Radiology Electronically Signed   By: Jacqulynn Cadet M.D.   On: 04/18/2020 11:52   IR Angiogram Selective Each Additional Vessel  Result Date: 04/18/2020 INDICATION: 64 year old male with multifocal hepatocellular carcinoma. He presents today for left segment two trans arterial radio embolization with Y 90. EXAM: 1. Ultrasound-guided vascular access left radial artery 2. Catheterization of the celiac artery with arteriogram 3. Catheterization of the common hepatic artery with arteriogram 4. Catheterization of the segment 2 hepatic artery with arteriogram 5. Trans arterial radio embolization MEDICATIONS: 3.375 g Zosyn. 3000 units heparin, 400 mcg nitroglycerin and 5 mg verapamil. The antibiotic was administered within 1 hour of the procedure ANESTHESIA/SEDATION: Moderate (conscious) sedation was employed during this procedure. A total of Versed 2 mg and Fentanyl 150 mcg and 37.5 mg Benadryl was  administered intravenously. Moderate Sedation Time: 46 minutes. The patient's level of consciousness and vital signs were monitored continuously by radiology nursing throughout the procedure under my direct supervision. CONTRAST:  40 mL Omnipaque 300 FLUOROSCOPY TIME:  Fluoroscopy Time: 4 minutes 36 seconds (436 mGy). COMPLICATIONS: None immediate. PROCEDURE: Informed consent was obtained from the patient following explanation of the procedure, risks, benefits and alternatives. The patient understands, agrees and consents for the procedure. All questions were addressed. A time out was performed prior to the initiation of the procedure. Maximal barrier sterile technique utilized including caps, mask, sterile gowns, sterile gloves, large sterile drape, hand hygiene, and Betadine prep. The left radial artery was interrogated with ultrasound and found to be widely patent and of adequate diameter. An image was obtained and stored for the medical record. Local anesthesia was attained by infiltration with 1% lidocaine. Under real-time sonographic guidance, the vessel was punctured with a 21 gauge micropuncture needle. Using standard technique, the initial micro needle was exchanged over a 0.021 micro wire for a hydrophilic 5 French slim arterial sheath. The sheath was flushed and then a radial cocktail consisting of 5000 units heparin, 2.5 mg Verapamil and 200 mcg nitroglycerine was injected through the sheath. An ultimate 1 catheter was advanced over a Bentson wire down the abdominal aorta. The catheter was used to select the celiac artery. A contrast injection was performed confirming patency of the celiac artery. The ultimate 1 catheter was then advanced into the common hepatic artery over a glidewire. Arteriography was performed confirming successful and persistent occlusion of the gastroduodenal artery and the right gastric artery. No interval development of a new gastric or duodenal collaterals. A high-flow Renegade  microcatheter was then advanced over a Fathom 16 wire and used to select the segment 2 hepatic artery. Arteriography was performed confirming catheter position and also demonstrating hypervascular tumor blush in hepatic segment 2. The catheter was secured in place. Trans arterial radio embolization was then performed using resin Y 90 microspheres in the standard fashion. Spheres were administered in small aliquots and antegrade flow was assessed  intermittently throughout the procedure and again following completion of the embolization. No evidence of stasis. The catheters were removed and disposed of appropriately. Hemostasis was attained with the assistance of a TR band. IMPRESSION: Successful segment 2 trans arterial radio embolization with Y 90 microspheres. Signed, Criselda Peaches, MD, [facility] Radiology Electronically Signed   By: Jacqulynn Cadet M.D.   On: 04/18/2020 11:52   NM Special Med Rad Physics Cons  Result Date: 04/18/2020 CLINICAL DATA:  Recurrent hepatocellular carcinoma. First treatment to left lobe of liver EXAM: NUCLEAR MEDICINE SPECIAL MED RAD PHYSICS CONS; NUCLEAR MEDICINE RADIO PHARM THERAPY INTRA ARTERIAL; NUCLEAR MEDICINE TREATMENT PROCEDURE; NUCLEAR MEDICINE LIVER SCAN TECHNIQUE: In conjunction with the interventional radiologist a Y- Microsphere dose was calculated utilizing body surface area formulation. Calculated dose equal 16 mCi. Pre therapy MAA liver SPECT scan and CTA were evaluated. Utilizing a microcatheter system, the hepatic artery was selected and Y-90 microspheres were delivered in fractionated aliquots. Radiopharmaceutical was delivered by the interventional radiologist and nuclear radiologist. The patient tolerated procedure well. No adverse effects were noted. Bremsstrahlung planar and SPECT imaging of the abdomen following intrahepatic arterial delivery of Y-90 microsphere was performed. RADIOPHARMACEUTICALS:   14.40 MCI SIR-SPHERES Y-90 microspheres COMPARISON:  02/01/2020. FINDINGS: Y - 90 microspheres therapy as above. First therapy the left hepatic lobe. Bremsstrahlung planar and SPECT imaging of the abdomen following intrahepatic arterial delivery of Y-90 microsphere demonstrates radioactivity localized to the left hepatic lobe. No evidence of extrahepatic activity. IMPRESSION: Successful Y - 90 microsphere delivery for treatment of unresectable liver metastasis. First therapy to the left lobe. Bremssstrahlung scan demonstrates activity localized to left hepatic lobe with no extrahepatic activity identified. Electronically Signed   By: Kerby Moors M.D.   On: 04/18/2020 14:07   NM Special Treatment Procedure  Result Date: 04/18/2020 CLINICAL DATA:  Recurrent hepatocellular carcinoma. First treatment to left lobe of liver EXAM: NUCLEAR MEDICINE SPECIAL MED RAD PHYSICS CONS; NUCLEAR MEDICINE RADIO PHARM THERAPY INTRA ARTERIAL; NUCLEAR MEDICINE TREATMENT PROCEDURE; NUCLEAR MEDICINE LIVER SCAN TECHNIQUE: In conjunction with the interventional radiologist a Y- Microsphere dose was calculated utilizing body surface area formulation. Calculated dose equal 16 mCi. Pre therapy MAA liver SPECT scan and CTA were evaluated. Utilizing a microcatheter system, the hepatic artery was selected and Y-90 microspheres were delivered in fractionated aliquots. Radiopharmaceutical was delivered by the interventional radiologist and nuclear radiologist. The patient tolerated procedure well. No adverse effects were noted. Bremsstrahlung planar and SPECT imaging of the abdomen following intrahepatic arterial delivery of Y-90 microsphere was performed. RADIOPHARMACEUTICALS:  14.40 MCI SIR-SPHERES Y-90 microspheres COMPARISON:  02/01/2020. FINDINGS: Y - 90 microspheres therapy as above. First therapy the left hepatic lobe. Bremsstrahlung planar and SPECT imaging of the abdomen following intrahepatic arterial delivery of Y-90 microsphere  demonstrates radioactivity localized to the left hepatic lobe. No evidence of extrahepatic activity. IMPRESSION: Successful Y - 90 microsphere delivery for treatment of unresectable liver metastasis. First therapy to the left lobe. Bremssstrahlung scan demonstrates activity localized to left hepatic lobe with no extrahepatic activity identified. Electronically Signed   By: Kerby Moors M.D.   On: 04/18/2020 14:07   IR US Guide Vasc Access Left  Result Date: 04/18/2020 INDICATION: 64 year old male with multifocal hepatocellular carcinoma. He presents today for left segment two trans arterial radio embolization with Y 90. EXAM: 1. Ultrasound-guided vascular access left radial artery 2. Catheterization of the celiac artery with arteriogram 3. Catheterization of the common hepatic artery with arteriogram 4. Catheterization  of the segment 2 hepatic artery with arteriogram 5. Trans arterial radio embolization MEDICATIONS: 3.375 g Zosyn. 3000 units heparin, 400 mcg nitroglycerin and 5 mg verapamil. The antibiotic was administered within 1 hour of the procedure ANESTHESIA/SEDATION: Moderate (conscious) sedation was employed during this procedure. A total of Versed 2 mg and Fentanyl 150 mcg and 37.5 mg Benadryl was administered intravenously. Moderate Sedation Time: 46 minutes. The patient's level of consciousness and vital signs were monitored continuously by radiology nursing throughout the procedure under my direct supervision. CONTRAST:  40 mL Omnipaque 300 FLUOROSCOPY TIME:  Fluoroscopy Time: 4 minutes 36 seconds (436 mGy). COMPLICATIONS: None immediate. PROCEDURE: Informed consent was obtained from the patient following explanation of the procedure, risks, benefits and alternatives. The patient understands, agrees and consents for the procedure. All questions were addressed. A time out was performed prior to the initiation of the procedure. Maximal barrier sterile technique utilized including caps, mask, sterile  gowns, sterile gloves, large sterile drape, hand hygiene, and Betadine prep. The left radial artery was interrogated with ultrasound and found to be widely patent and of adequate diameter. An image was obtained and stored for the medical record. Local anesthesia was attained by infiltration with 1% lidocaine. Under real-time sonographic guidance, the vessel was punctured with a 21 gauge micropuncture needle. Using standard technique, the initial micro needle was exchanged over a 0.021 micro wire for a hydrophilic 5 French slim arterial sheath. The sheath was flushed and then a radial cocktail consisting of 5000 units heparin, 2.5 mg Verapamil and 200 mcg nitroglycerine was injected through the sheath. An ultimate 1 catheter was advanced over a Bentson wire down the abdominal aorta. The catheter was used to select the celiac artery. A contrast injection was performed confirming patency of the celiac artery. The ultimate 1 catheter was then advanced into the common hepatic artery over a glidewire. Arteriography was performed confirming successful and persistent occlusion of the gastroduodenal artery and the right gastric artery. No interval development of a new gastric or duodenal collaterals. A high-flow Renegade microcatheter was then advanced over a Fathom 16 wire and used to select the segment 2 hepatic artery. Arteriography was performed confirming catheter position and also demonstrating hypervascular tumor blush in hepatic segment 2. The catheter was secured in place. Trans arterial radio embolization was then performed using resin Y 90 microspheres in the standard fashion. Spheres were administered in small aliquots and antegrade flow was assessed intermittently throughout the procedure and again following completion of the embolization. No evidence of stasis. The catheters were removed and disposed of appropriately. Hemostasis was attained with the assistance of a TR band. IMPRESSION: Successful segment 2  trans arterial radio embolization with Y 90 microspheres. Signed, Criselda Peaches, MD, [facility] Radiology Electronically Signed   By: Jacqulynn Cadet M.D.   On: 04/18/2020 11:52   IR EMBO TUMOR ORGAN ISCHEMIA INFARCT INC GUIDE ROADMAPPING  Result Date: 04/18/2020 INDICATION: 64 year old male with multifocal hepatocellular carcinoma. He presents today for left segment two trans arterial radio embolization with Y 90. EXAM: 1. Ultrasound-guided vascular access left radial artery 2. Catheterization of the celiac artery with arteriogram 3. Catheterization of the common hepatic artery with arteriogram 4. Catheterization of the segment 2 hepatic artery with arteriogram 5. Trans arterial radio embolization MEDICATIONS: 3.375 g Zosyn. 3000 units heparin, 400 mcg nitroglycerin and 5 mg verapamil. The antibiotic was administered within 1 hour of the procedure ANESTHESIA/SEDATION: Moderate (conscious) sedation was employed during this procedure. A  total of Versed 2 mg and Fentanyl 150 mcg and 37.5 mg Benadryl was administered intravenously. Moderate Sedation Time: 46 minutes. The patient's level of consciousness and vital signs were monitored continuously by radiology nursing throughout the procedure under my direct supervision. CONTRAST:  40 mL Omnipaque 300 FLUOROSCOPY TIME:  Fluoroscopy Time: 4 minutes 36 seconds (436 mGy). COMPLICATIONS: None immediate. PROCEDURE: Informed consent was obtained from the patient following explanation of the procedure, risks, benefits and alternatives. The patient understands, agrees and consents for the procedure. All questions were addressed. A time out was performed prior to the initiation of the procedure. Maximal barrier sterile technique utilized including caps, mask, sterile gowns, sterile gloves, large sterile drape, hand hygiene, and Betadine prep. The left radial artery was interrogated with ultrasound and found to be  widely patent and of adequate diameter. An image was obtained and stored for the medical record. Local anesthesia was attained by infiltration with 1% lidocaine. Under real-time sonographic guidance, the vessel was punctured with a 21 gauge micropuncture needle. Using standard technique, the initial micro needle was exchanged over a 0.021 micro wire for a hydrophilic 5 French slim arterial sheath. The sheath was flushed and then a radial cocktail consisting of 5000 units heparin, 2.5 mg Verapamil and 200 mcg nitroglycerine was injected through the sheath. An ultimate 1 catheter was advanced over a Bentson wire down the abdominal aorta. The catheter was used to select the celiac artery. A contrast injection was performed confirming patency of the celiac artery. The ultimate 1 catheter was then advanced into the common hepatic artery over a glidewire. Arteriography was performed confirming successful and persistent occlusion of the gastroduodenal artery and the right gastric artery. No interval development of a new gastric or duodenal collaterals. A high-flow Renegade microcatheter was then advanced over a Fathom 16 wire and used to select the segment 2 hepatic artery. Arteriography was performed confirming catheter position and also demonstrating hypervascular tumor blush in hepatic segment 2. The catheter was secured in place. Trans arterial radio embolization was then performed using resin Y 90 microspheres in the standard fashion. Spheres were administered in small aliquots and antegrade flow was assessed intermittently throughout the procedure and again following completion of the embolization. No evidence of stasis. The catheters were removed and disposed of appropriately. Hemostasis was attained with the assistance of a TR band. IMPRESSION: Successful segment 2 trans arterial radio embolization with Y 90 microspheres. Signed, Criselda Peaches, MD, [facility]  [facility] Radiology Electronically Signed   By: Jacqulynn Cadet M.D.   On: 04/18/2020 11:52   NM Radio Pharm Therapy Intraarterial  Result Date: 04/18/2020 CLINICAL DATA:  Recurrent hepatocellular carcinoma. First treatment to left lobe of liver EXAM: NUCLEAR MEDICINE SPECIAL MED RAD PHYSICS CONS; NUCLEAR MEDICINE RADIO PHARM THERAPY INTRA ARTERIAL; NUCLEAR MEDICINE TREATMENT PROCEDURE; NUCLEAR MEDICINE LIVER SCAN TECHNIQUE: In conjunction with the interventional radiologist a Y- Microsphere dose was calculated utilizing body surface area formulation. Calculated dose equal 16 mCi. Pre therapy MAA liver SPECT scan and CTA were evaluated. Utilizing a microcatheter system, the hepatic artery was selected and Y-90 microspheres were delivered in fractionated aliquots. Radiopharmaceutical was delivered by the interventional radiologist and nuclear radiologist. The patient tolerated procedure well. No adverse effects were noted. Bremsstrahlung planar and SPECT imaging of the abdomen following intrahepatic arterial delivery of Y-90 microsphere was performed. RADIOPHARMACEUTICALS:  14.40 MCI SIR-SPHERES Y-90 microspheres COMPARISON:  02/01/2020. FINDINGS: Y - 90 microspheres therapy as above. First therapy the left hepatic  lobe. Bremsstrahlung planar and SPECT imaging of the abdomen following intrahepatic arterial delivery of Y-90 microsphere demonstrates radioactivity localized to the left hepatic lobe. No evidence of extrahepatic activity. IMPRESSION: Successful Y - 90 microsphere delivery for treatment of unresectable liver metastasis. First therapy to the left lobe. Bremssstrahlung scan demonstrates activity localized to left hepatic lobe with no extrahepatic activity identified. Electronically Signed   By: Kerby Moors M.D.   On: 04/18/2020 14:07    ASSESSMENT & PLAN:  Jeffrey Snow is a 63 y.o. Caucasian male with a history of Anxiety, liver Cirrhosis, depression, Hep C   1. Recurrent multifocal  hepatocellular carcinoma   -He was initially diagnosed by MRI and elevated tumor marker on 04/25/14. He was treated with microwave ablation in 05/2014.  He has longstanding history of alcohol and hepatitis C related liver cirrhosis. -He had been seen by Dr Geroge Baseman with MRIs since then. He was found to have multifocal cancer recurrence in liver on 02/01/20 MRI with elevated tumor marker again.  -Given his multifocal cancer in liver he is not eligible for liver surgery or transplant. His cancer is no longer curable but still treatable to control his disease and prolong his life. I tried to discuss this with him today.  -He was treated with Y90 on 03/21/20 and again on 04/18/20. He is recovering well. -I discussed Y90 did not cure him and his cancer will eventually start growing. When he does progress systemic treatment is recommend to control his disease.  I discussed the systemic treatment options, including oral TKI's, intravenous immunotherapy, with or without bevacizumab. -Patient seemed to have very low healthy literacy, and does not understand why he needs to see me.  Despite I repeatedly explained his diagnosis, prognosis, and treatment options, he did not seem to understand well why he may need systemic therapy, and he was upset that he has losing the eligibility for liver transplant. -Labs from last week reviewed, CBC and CMP WNL except plt 61K, BG 104, Alk Phos 132. Physical exam unremarkable today.  -I recommend CT Chest to complete staging of current liver cancer.  Patient declined. -F/u 4 to 5 months with lab. I will contact Dr. Geroge Baseman on when he plans to f/u with him for repeat MRI liver s/p treatment.    2. Liver Cirrhosis, Hep C (treated) -He notes he quit drinking alcohol 10-15 years ago after drinking 8-10 beers a day. He also quit smoking at the same time after smoking for 35 years. He notes he was negative for prior lung cancer screening.  -He was diagnosed with liver cirrhosis by Dr  Jimmye Norman about 10 years ago and he was referred to Dr. Geroge Baseman who has been monitored by him since.  -His 2016 upper Endoscopies by Dr Penelope Coop and Dr Amedeo Plenty were negative for Esophageal Varices.  -He was treated with Harvoni for his Hep C by Dr Amedeo Plenty. He was diagnosed after his Cirrhosis.  -He was going to be seen by [facility] for liver transplant, but not found to be eligible per Dr Geroge Baseman.  -He is on Lasix to keep fluid off his liver. He has been on long term managed by Dr Clovis Riley, per pt.     3. Anxiety/Depression  -On Xanax   4. Chronic right foot neuropathy  -He is being seen by pain specialist at [facility] -He has been on Oxycodone 1-2 tab every few days as needed for this. He is also on 142m Gabapentin TID which he does not feel helps  him enough.    5. Social Support  -He is single with 3 daughters who live out of town.  -He notes he is able to take care of himself. He does have transportation assistance.     PLAN: -pt declined CT chest  -F/u in 4-5 months for f/u, if he will see Dr. Geroge Baseman in 3 months with liver MRI    No orders of the defined types were placed in this encounter.   All questions were answered. The patient knows to call the clinic with any problems, questions or concerns. The total time spent in the appointment was 50 minutes.     Truitt Merle, MD 04/24/2020 5:47 PM  I, Joslyn Devon, am acting as scribe for Truitt Merle, MD.   I have reviewed the above documentation for accuracy and completeness, and I agree with the above.

## 2020-04-24 ENCOUNTER — Other Ambulatory Visit: Payer: Self-pay | Admitting: Interventional Radiology

## 2020-04-24 ENCOUNTER — Inpatient Hospital Stay: Payer: Medicare Other | Attending: Hematology | Admitting: Hematology

## 2020-04-24 ENCOUNTER — Encounter: Payer: Self-pay | Admitting: Hematology

## 2020-04-24 ENCOUNTER — Other Ambulatory Visit: Payer: Self-pay

## 2020-04-24 VITALS — BP 182/77 | HR 65 | Temp 98.9°F | Resp 20 | Ht 68.0 in | Wt 181.0 lb

## 2020-04-24 DIAGNOSIS — F1021 Alcohol dependence, in remission: Secondary | ICD-10-CM | POA: Insufficient documentation

## 2020-04-24 DIAGNOSIS — Z79899 Other long term (current) drug therapy: Secondary | ICD-10-CM | POA: Insufficient documentation

## 2020-04-24 DIAGNOSIS — C220 Liver cell carcinoma: Secondary | ICD-10-CM | POA: Insufficient documentation

## 2020-04-24 DIAGNOSIS — I10 Essential (primary) hypertension: Secondary | ICD-10-CM | POA: Insufficient documentation

## 2020-04-24 DIAGNOSIS — F418 Other specified anxiety disorders: Secondary | ICD-10-CM | POA: Diagnosis not present

## 2020-04-24 DIAGNOSIS — K7030 Alcoholic cirrhosis of liver without ascites: Secondary | ICD-10-CM | POA: Diagnosis not present

## 2020-04-24 DIAGNOSIS — Z87891 Personal history of nicotine dependence: Secondary | ICD-10-CM | POA: Insufficient documentation

## 2020-04-25 ENCOUNTER — Telehealth: Payer: Self-pay | Admitting: Hematology

## 2020-04-25 NOTE — Telephone Encounter (Signed)
Scheduled appt per 5/10 los.  Called pt and he stated he will check his my chart

## 2020-04-28 ENCOUNTER — Other Ambulatory Visit: Payer: Self-pay

## 2020-04-28 DIAGNOSIS — C220 Liver cell carcinoma: Secondary | ICD-10-CM

## 2020-05-02 LAB — CBC
HCT: 45.3 % (ref 38.5–50.0)
Hemoglobin: 14.7 g/dL (ref 13.2–17.1)
MCH: 30.4 pg (ref 27.0–33.0)
MCHC: 32.5 g/dL (ref 32.0–36.0)
MCV: 93.6 fL (ref 80.0–100.0)
MPV: 13.5 fL — ABNORMAL HIGH (ref 7.5–12.5)
Platelets: 45 10*3/uL — ABNORMAL LOW (ref 140–400)
RBC: 4.84 10*6/uL (ref 4.20–5.80)
RDW: 15.5 % — ABNORMAL HIGH (ref 11.0–15.0)
WBC: 5.2 10*3/uL (ref 3.8–10.8)

## 2020-05-02 LAB — COMPLETE METABOLIC PANEL WITH GFR
AG Ratio: 1.4 (calc) (ref 1.0–2.5)
ALT: 38 U/L (ref 9–46)
AST: 35 U/L (ref 10–35)
Albumin: 3.5 g/dL — ABNORMAL LOW (ref 3.6–5.1)
Alkaline phosphatase (APISO): 168 U/L — ABNORMAL HIGH (ref 35–144)
BUN: 8 mg/dL (ref 7–25)
CO2: 25 mmol/L (ref 20–32)
Calcium: 8.9 mg/dL (ref 8.6–10.3)
Chloride: 105 mmol/L (ref 98–110)
Creat: 0.79 mg/dL (ref 0.70–1.25)
GFR, Est African American: 109 mL/min/{1.73_m2} (ref >=60)
GFR, Est Non African American: 94 mL/min/{1.73_m2} (ref >=60)
Globulin: 2.5 g/dL (calc) (ref 1.9–3.7)
Glucose, Bld: 88 mg/dL (ref 65–99)
Potassium: 3.8 mmol/L (ref 3.5–5.3)
Sodium: 139 mmol/L (ref 135–146)
Total Bilirubin: 1.2 mg/dL (ref 0.2–1.2)
Total Protein: 6 g/dL — ABNORMAL LOW (ref 6.1–8.1)

## 2020-05-02 LAB — PROTIME-INR
INR: 1.1
Prothrombin Time: 11.6 s — ABNORMAL HIGH (ref 9.0–11.5)

## 2020-05-02 LAB — AFP TUMOR MARKER: AFP-Tumor Marker: 94.7 ng/mL — ABNORMAL HIGH (ref <6.1)

## 2020-05-11 ENCOUNTER — Ambulatory Visit
Admission: RE | Admit: 2020-05-11 | Discharge: 2020-05-11 | Disposition: A | Discharge status: Home / Self Care | Payer: Medicare Other | Source: Ambulatory Visit | Attending: Interventional Radiology | Admitting: Interventional Radiology

## 2020-05-11 ENCOUNTER — Other Ambulatory Visit: Payer: Self-pay

## 2020-05-11 ENCOUNTER — Encounter: Payer: Self-pay | Admitting: *Deleted

## 2020-05-11 DIAGNOSIS — C220 Liver cell carcinoma: Secondary | ICD-10-CM

## 2020-05-11 HISTORY — PX: IR RADIOLOGIST EVAL & MGMT: IMG5224

## 2020-05-11 NOTE — Progress Notes (Signed)
Chief Complaint: Patient was seen in follow-up remotely today (TeleHealth) for  at the request of [place].    Referring Physician(s): Keric Zehren  History of Present Illness: Jeffrey Snow is a 64 y.o. male withalcoholic and HCV cirrhosis complicated by hepatocellular carcinoma, ascites and esophageal varices. He was treated by combination drug-eluting bead transarterial chemoembolization followed by microwave thermal ablation on 6/25 and 06/10/2014.  After doing well for just over 5 years, he developed a small LR 3 lesion which we were keeping an eye on.  On routine surveillance, his alpha-fetoprotein spiked to 820.3 compared to 132 last year.   An MRI dated 02/01/2020 demonstrates significant interval enlargement of previously noted LR-3 lesions.  He also has at least 2 new lesions.  He has at least 4 lesions, 3 in the right hemiliver and one in the left.  His largest individual lesion measures up to 4.4 cm in segment 7 of the hepatic dome.  Given progression to multifocal hepatocellular carcinoma, he underwent transarterial radio embolization with right lobar treatment on 03/21/2020 followed by a segment 2 segmentectomy on 04/18/2020.   We are now talking via teleconference today 3 weeks status post transarterial radio embolization.  Jeffrey Snow reports that he feels great.  His appetite remains excellent.  He denies abdominal pain, fever, nausea, vomiting, chills, weight loss or fatigue.  I gave him the good news that his AFP has decreased significantly to just less than 100.  This is a very good sign that we will see a nice response to therapy.  Past Medical History:  Diagnosis Date  . Abnormal MRI of abdomen, liver  04/21/2014  . Anxiety   . Cirrhosis, alcoholic ([place]) 123456  . Claudication of left lower extremity (HCC) m-1  . Depression   . [place] (hepatocellular carcinoma) ([place])   . Hepatitis C   . Hypertension   . Liver mass   . Panic attack     Past  Surgical History:  Procedure Laterality Date  . ABDOMINAL AORTAGRAM N/A 12/27/2013   Procedure: ABDOMINAL Maxcine Ham;  Surgeon: Conrad , MD;  Location: [facility] CATH LAB;  Service: Cardiovascular;  Laterality: N/A;  . AORTOGRAM    . COSMETIC SURGERY     facial reconstructive surgery  . ESOPHAGOGASTRODUODENOSCOPY N/A 12/17/2014   Procedure: ESOPHAGOGASTRODUODENOSCOPY (EGD);  Surgeon: Missy Sabins, MD;  Location: [facility] ENDOSCOPY;  Service: Endoscopy;  Laterality: N/A;  . ESOPHAGOGASTRODUODENOSCOPY N/A 09/05/2015   Procedure: ESOPHAGOGASTRODUODENOSCOPY (EGD);  Surgeon: Wonda Horner, MD;  Location: [facility];  Service: Endoscopy;  Laterality: N/A;  . IR ANGIOGRAM EXTREMITY RIGHT  03/21/2020  . IR ANGIOGRAM SELECTIVE EACH ADDITIONAL VESSEL  03/10/2020  . IR ANGIOGRAM SELECTIVE EACH ADDITIONAL VESSEL  03/10/2020  . IR ANGIOGRAM SELECTIVE EACH ADDITIONAL VESSEL  03/10/2020  . IR ANGIOGRAM SELECTIVE EACH ADDITIONAL VESSEL  03/10/2020  . IR ANGIOGRAM SELECTIVE EACH ADDITIONAL VESSEL  03/10/2020  . IR ANGIOGRAM SELECTIVE EACH ADDITIONAL VESSEL  03/21/2020  . IR ANGIOGRAM SELECTIVE EACH ADDITIONAL VESSEL  03/21/2020  . IR ANGIOGRAM SELECTIVE EACH ADDITIONAL VESSEL  03/21/2020  . IR ANGIOGRAM SELECTIVE EACH ADDITIONAL VESSEL  03/21/2020  . IR ANGIOGRAM SELECTIVE EACH ADDITIONAL VESSEL  03/21/2020  . IR ANGIOGRAM SELECTIVE EACH ADDITIONAL VESSEL  04/18/2020  . IR ANGIOGRAM SELECTIVE EACH ADDITIONAL VESSEL  04/18/2020  . IR ANGIOGRAM VISCERAL SELECTIVE  03/10/2020  . IR ANGIOGRAM VISCERAL SELECTIVE  03/21/2020  . IR ANGIOGRAM VISCERAL SELECTIVE  04/18/2020  . IR EMBO ARTERIAL NOT [place]  03/10/2020  . IR EMBO TUMOR ORGAN ISCHEMIA INFARCT INC GUIDE ROADMAPPING  03/21/2020  . IR EMBO TUMOR ORGAN ISCHEMIA INFARCT INC GUIDE ROADMAPPING  04/18/2020  . IR GENERIC HISTORICAL  01/19/2015   IR RADIOLOGIST EVAL & MGMT 01/19/2015 Jacqulynn Cadet, MD [facility] INTERV RAD  . IR GENERIC HISTORICAL  07/23/2016   IR RADIOLOGIST  EVAL & MGMT 07/23/2016 Jacqulynn Cadet, MD [facility] INTERV RAD  . IR GENERIC HISTORICAL  05/30/2015   IR RADIOLOGIST EVAL & MGMT 05/30/2015 [facility] INTERV RAD  . IR GENERIC HISTORICAL  10/04/2014   IR RADIOLOGIST EVAL & MGMT 10/04/2014 Jacqulynn Cadet, MD [facility] INTERV RAD  . IR GENERIC HISTORICAL  02/11/2017   IR RADIOLOGIST EVAL & MGMT 02/11/2017 Jacqulynn Cadet, MD [facility] INTERV RAD  . IR RADIOLOGIST EVAL & MGMT  08/13/2017  . IR RADIOLOGIST EVAL & MGMT  08/11/2018  . IR RADIOLOGIST EVAL & MGMT  02/09/2019  . IR RADIOLOGIST EVAL & MGMT  02/10/2020  . IR US GUIDE VASC ACCESS LEFT  03/10/2020  . IR US GUIDE VASC ACCESS LEFT  03/21/2020  . IR US GUIDE VASC ACCESS LEFT  04/18/2020  . IR US GUIDE VASC ACCESS RIGHT  03/21/2020  . LOWER EXTREMITY ANGIOGRAM Bilateral 12/27/2013   Procedure: LOWER EXTREMITY ANGIOGRAM;  Surgeon: Conrad Ponderosa, MD;  Location: [facility] CATH LAB;  Service: Cardiovascular;  Laterality: Bilateral;    Allergies: Folic acid and Oxycodone  Medications: Prior to Admission medications   Medication Sig Start Date End Date Taking? Authorizing Provider  albuterol (VENTOLIN HFA) 108 (90 Base) MCG/ACT inhaler Inhale 2 puffs into the lungs every 6 (six) hours as needed for wheezing or shortness of breath. 11/26/19   Azzie Glatter, FNP  ALPRAZolam Duanne Moron) 0.5 MG tablet Take 0.5 mg by mouth 2 (two) times daily.     [provider]  bisacodyl (DULCOLAX) 5 MG EC tablet Take 5 mg by mouth daily as needed for mild constipation or moderate constipation. Reported on 04/03/2016    [provider]  furosemide (LASIX) 20 MG tablet TAKE 1 TABLET(20 MG) BY MOUTH DAILY 11/29/19   Azzie Glatter, FNP  furosemide (LASIX) 20 MG tablet TAKE 1 TABLET BY MOUTH EVERY MORNING 11/26/19   Azzie Glatter, FNP  gabapentin (NEURONTIN) 100 MG capsule Take 1 capsule, by mouth 3 times a day. 11/26/19   Azzie Glatter, FNP  hydrOXYzine (ATARAX/VISTARIL) 10 MG tablet Take 1 tablet (10 mg total) by  mouth 3 (three) times daily as needed. 11/26/19   Azzie Glatter, FNP  pantoprazole (PROTONIX) 40 MG tablet TAKE 1 TABLET(40 MG) BY MOUTH TWICE DAILY BEFORE A MEAL 11/26/19   Azzie Glatter, FNP  spironolactone (ALDACTONE) 25 MG tablet TAKE 3 TABLETS(75 MG) BY MOUTH DAILY 04/03/20   Azzie Glatter, FNP  vitamin C (ASCORBIC ACID) 250 MG tablet Take 500 mg by mouth daily.    [provider]     Family History  Problem Relation Age of Onset  . Diabetes Father   . Diabetes Brother   . Diabetes Brother     Social History   Socioeconomic History  . Marital status: Single    Spouse name: Not on file  . Number of children: 3  . Years of education: Not on file  . Highest education level: Not on file  Occupational History    Employer: [employer]  Tobacco Use  . Smoking status: Former Smoker    Packs/day: 0.50    Quit date:  08/21/2011    Years since quitting: 8.7  . Smokeless tobacco: Never Used  Substance and Sexual Activity  . Alcohol use: No    Alcohol/week: 0.0 standard drinks    Comment: Occasional beer on the weekends  . Drug use: No    Types: Cocaine    Comment: abused cocaine in the 80's  . Sexual activity: Not Currently  Other Topics Concern  . Not on file  Social History Narrative   Single, never married.  Lives alone.  3 daughters, 16 grandchildren who are not very involved in the patient's care.  Quit smoking and heavy drinking 3.5 years ago.    Social Determinants of Health   Financial Resource Strain:   . Difficulty of Paying Living Expenses:   Food Insecurity:   . Worried About Charity fundraiser in the Last Year:   . Arboriculturist in the Last Year:   Transportation Needs:   . Film/video editor (Medical):   Marland Kitchen Lack of Transportation (Non-Medical):   Physical Activity:   . Days of Exercise per Week:   . Minutes of Exercise per Session:   Stress:   . Feeling of Stress :   Social Connections:   . Frequency of Communication with  Friends and Family:   . Frequency of Social Gatherings with Friends and Family:   . Attends Religious Services:   . Active Member of Clubs or Organizations:   . Attends Archivist Meetings:   Marland Kitchen Marital Status:     ECOG Status: 0 - Asymptomatic  Review of Systems  Review of Systems: A 12 point ROS discussed and pertinent positives are indicated in the HPI above.  All other systems are negative.  Physical Exam No direct physical exam was performed (except for noted visual exam findings with Video Visits).    Vital Signs: There were no vitals taken for this visit.  Imaging: NM LIVER TUMOR LOC IMFLAM SPECT 1 DAY  Result Date: 04/18/2020 CLINICAL DATA:  Recurrent hepatocellular carcinoma. First treatment to left lobe of liver EXAM: NUCLEAR MEDICINE SPECIAL MED RAD PHYSICS CONS; NUCLEAR MEDICINE RADIO PHARM THERAPY INTRA ARTERIAL; NUCLEAR MEDICINE TREATMENT PROCEDURE; NUCLEAR MEDICINE LIVER SCAN TECHNIQUE: In conjunction with the interventional radiologist a Y- Microsphere dose was calculated utilizing body surface area formulation. Calculated dose equal 16 mCi. Pre therapy MAA liver SPECT scan and CTA were evaluated. Utilizing a microcatheter system, the hepatic artery was selected and Y-90 microspheres were delivered in fractionated aliquots. Radiopharmaceutical was delivered by the interventional radiologist and nuclear radiologist. The patient tolerated procedure well. No adverse effects were noted. Bremsstrahlung planar and SPECT imaging of the abdomen following intrahepatic arterial delivery of Y-90 microsphere was performed. RADIOPHARMACEUTICALS:  14.40 MCI SIR-SPHERES Y-90 microspheres COMPARISON:  02/01/2020. FINDINGS: Y - 90 microspheres therapy as above. First therapy the left hepatic lobe. Bremsstrahlung planar and SPECT imaging of the abdomen following intrahepatic arterial delivery of Y-90 microsphere demonstrates radioactivity localized to the left hepatic lobe. No evidence  of extrahepatic activity. IMPRESSION: Successful Y - 90 microsphere delivery for treatment of unresectable liver metastasis. First therapy to the left lobe. Bremssstrahlung scan demonstrates activity localized to left hepatic lobe with no extrahepatic activity identified. Electronically Signed   By: Kerby Moors M.D.   On: 04/18/2020 14:07   IR Angiogram Visceral Selective  Result Date: 04/18/2020 INDICATION: 64 year old male with multifocal hepatocellular carcinoma. He presents today for left segment two trans arterial radio embolization with Y 90. EXAM: 1. Ultrasound-guided vascular access  left radial artery 2. Catheterization of the celiac artery with arteriogram 3. Catheterization of the common hepatic artery with arteriogram 4. Catheterization of the segment 2 hepatic artery with arteriogram 5. Trans arterial radio embolization MEDICATIONS: 3.375 g Zosyn. 3000 units heparin, 400 mcg nitroglycerin and 5 mg verapamil. The antibiotic was administered within 1 hour of the procedure ANESTHESIA/SEDATION: Moderate (conscious) sedation was employed during this procedure. A total of Versed 2 mg and Fentanyl 150 mcg and 37.5 mg Benadryl was administered intravenously. Moderate Sedation Time: 46 minutes. The patient's level of consciousness and vital signs were monitored continuously by radiology nursing throughout the procedure under my direct supervision. CONTRAST:  40 mL Omnipaque 300 FLUOROSCOPY TIME:  Fluoroscopy Time: 4 minutes 36 seconds (436 mGy). COMPLICATIONS: None immediate. PROCEDURE: Informed consent was obtained from the patient following explanation of the procedure, risks, benefits and alternatives. The patient understands, agrees and consents for the procedure. All questions were addressed. A time out was performed prior to the initiation of the procedure. Maximal barrier sterile technique utilized including caps, mask, sterile gowns, sterile gloves, large sterile drape, hand hygiene, and Betadine  prep. The left radial artery was interrogated with ultrasound and found to be widely patent and of adequate diameter. An image was obtained and stored for the medical record. Local anesthesia was attained by infiltration with 1% lidocaine. Under real-time sonographic guidance, the vessel was punctured with a 21 gauge micropuncture needle. Using standard technique, the initial micro needle was exchanged over a 0.021 micro wire for a hydrophilic 5 French slim arterial sheath. The sheath was flushed and then a radial cocktail consisting of 5000 units heparin, 2.5 mg Verapamil and 200 mcg nitroglycerine was injected through the sheath. An ultimate 1 catheter was advanced over a Bentson wire down the abdominal aorta. The catheter was used to select the celiac artery. A contrast injection was performed confirming patency of the celiac artery. The ultimate 1 catheter was then advanced into the common hepatic artery over a glidewire. Arteriography was performed confirming successful and persistent occlusion of the gastroduodenal artery and the right gastric artery. No interval development of a new gastric or duodenal collaterals. A high-flow Renegade microcatheter was then advanced over a Fathom 16 wire and used to select the segment 2 hepatic artery. Arteriography was performed confirming catheter position and also demonstrating hypervascular tumor blush in hepatic segment 2. The catheter was secured in place. Trans arterial radio embolization was then performed using resin Y 90 microspheres in the standard fashion. Spheres were administered in small aliquots and antegrade flow was assessed intermittently throughout the procedure and again following completion of the embolization. No evidence of stasis. The catheters were removed and disposed of appropriately. Hemostasis was attained with the assistance of a TR band. IMPRESSION: Successful segment 2 trans arterial radio embolization with Y 90 microspheres. Signed, Criselda Peaches, MD, [facility] Radiology Electronically Signed   By: Jacqulynn Cadet M.D.   On: 04/18/2020 11:52   IR Angiogram Selective Each Additional Vessel  Result Date: 04/18/2020 INDICATION: 64 year old male with multifocal hepatocellular carcinoma. He presents today for left segment two trans arterial radio embolization with Y 90. EXAM: 1. Ultrasound-guided vascular access left radial artery 2. Catheterization of the celiac artery with arteriogram 3. Catheterization of the common hepatic artery with arteriogram 4. Catheterization of the segment 2 hepatic artery with arteriogram 5. Trans arterial radio embolization MEDICATIONS: 3.375 g Zosyn. 3000 units heparin, 400 mcg nitroglycerin and 5 mg verapamil. The  antibiotic was administered within 1 hour of the procedure ANESTHESIA/SEDATION: Moderate (conscious) sedation was employed during this procedure. A total of Versed 2 mg and Fentanyl 150 mcg and 37.5 mg Benadryl was administered intravenously. Moderate Sedation Time: 46 minutes. The patient's level of consciousness and vital signs were monitored continuously by radiology nursing throughout the procedure under my direct supervision. CONTRAST:  40 mL Omnipaque 300 FLUOROSCOPY TIME:  Fluoroscopy Time: 4 minutes 36 seconds (436 mGy). COMPLICATIONS: None immediate. PROCEDURE: Informed consent was obtained from the patient following explanation of the procedure, risks, benefits and alternatives. The patient understands, agrees and consents for the procedure. All questions were addressed. A time out was performed prior to the initiation of the procedure. Maximal barrier sterile technique utilized including caps, mask, sterile gowns, sterile gloves, large sterile drape, hand hygiene, and Betadine prep. The left radial artery was interrogated with ultrasound and found to be widely patent and of adequate diameter. An image was obtained and stored for the medical  record. Local anesthesia was attained by infiltration with 1% lidocaine. Under real-time sonographic guidance, the vessel was punctured with a 21 gauge micropuncture needle. Using standard technique, the initial micro needle was exchanged over a 0.021 micro wire for a hydrophilic 5 French slim arterial sheath. The sheath was flushed and then a radial cocktail consisting of 5000 units heparin, 2.5 mg Verapamil and 200 mcg nitroglycerine was injected through the sheath. An ultimate 1 catheter was advanced over a Bentson wire down the abdominal aorta. The catheter was used to select the celiac artery. A contrast injection was performed confirming patency of the celiac artery. The ultimate 1 catheter was then advanced into the common hepatic artery over a glidewire. Arteriography was performed confirming successful and persistent occlusion of the gastroduodenal artery and the right gastric artery. No interval development of a new gastric or duodenal collaterals. A high-flow Renegade microcatheter was then advanced over a Fathom 16 wire and used to select the segment 2 hepatic artery. Arteriography was performed confirming catheter position and also demonstrating hypervascular tumor blush in hepatic segment 2. The catheter was secured in place. Trans arterial radio embolization was then performed using resin Y 90 microspheres in the standard fashion. Spheres were administered in small aliquots and antegrade flow was assessed intermittently throughout the procedure and again following completion of the embolization. No evidence of stasis. The catheters were removed and disposed of appropriately. Hemostasis was attained with the assistance of a TR band. IMPRESSION: Successful segment 2 trans arterial radio embolization with Y 90 microspheres. Signed, Criselda Peaches, MD, [place] [facility] Radiology Electronically Signed   By: Jacqulynn Cadet M.D.   On: 04/18/2020 11:52     IR Angiogram Selective Each Additional Vessel  Result Date: 04/18/2020 INDICATION: 64 year old male with multifocal hepatocellular carcinoma. He presents today for left segment two trans arterial radio embolization with Y 90. EXAM: 1. Ultrasound-guided vascular access left radial artery 2. Catheterization of the celiac artery with arteriogram 3. Catheterization of the common hepatic artery with arteriogram 4. Catheterization of the segment 2 hepatic artery with arteriogram 5. Trans arterial radio embolization MEDICATIONS: 3.375 g Zosyn. 3000 units heparin, 400 mcg nitroglycerin and 5 mg verapamil. The antibiotic was administered within 1 hour of the procedure ANESTHESIA/SEDATION: Moderate (conscious) sedation was employed during this procedure. A total of Versed 2 mg and Fentanyl 150 mcg and 37.5 mg Benadryl was administered intravenously. Moderate Sedation Time: 46 minutes. The patient's level of consciousness and vital signs were  monitored continuously by radiology nursing throughout the procedure under my direct supervision. CONTRAST:  40 mL Omnipaque 300 FLUOROSCOPY TIME:  Fluoroscopy Time: 4 minutes 36 seconds (436 mGy). COMPLICATIONS: None immediate. PROCEDURE: Informed consent was obtained from the patient following explanation of the procedure, risks, benefits and alternatives. The patient understands, agrees and consents for the procedure. All questions were addressed. A time out was performed prior to the initiation of the procedure. Maximal barrier sterile technique utilized including caps, mask, sterile gowns, sterile gloves, large sterile drape, hand hygiene, and Betadine prep. The left radial artery was interrogated with ultrasound and found to be widely patent and of adequate diameter. An image was obtained and stored for the medical record. Local anesthesia was attained by infiltration with 1% lidocaine. Under real-time sonographic guidance, the vessel was punctured with a 21 gauge micropuncture  needle. Using standard technique, the initial micro needle was exchanged over a 0.021 micro wire for a hydrophilic 5 French slim arterial sheath. The sheath was flushed and then a radial cocktail consisting of 5000 units heparin, 2.5 mg Verapamil and 200 mcg nitroglycerine was injected through the sheath. An ultimate 1 catheter was advanced over a Bentson wire down the abdominal aorta. The catheter was used to select the celiac artery. A contrast injection was performed confirming patency of the celiac artery. The ultimate 1 catheter was then advanced into the common hepatic artery over a glidewire. Arteriography was performed confirming successful and persistent occlusion of the gastroduodenal artery and the right gastric artery. No interval development of a new gastric or duodenal collaterals. A high-flow Renegade microcatheter was then advanced over a Fathom 16 wire and used to select the segment 2 hepatic artery. Arteriography was performed confirming catheter position and also demonstrating hypervascular tumor blush in hepatic segment 2. The catheter was secured in place. Trans arterial radio embolization was then performed using resin Y 90 microspheres in the standard fashion. Spheres were administered in small aliquots and antegrade flow was assessed intermittently throughout the procedure and again following completion of the embolization. No evidence of stasis. The catheters were removed and disposed of appropriately. Hemostasis was attained with the assistance of a TR band. IMPRESSION: Successful segment 2 trans arterial radio embolization with Y 90 microspheres. Signed, Criselda Peaches, MD, [place] [facility] Radiology Electronically Signed   By: Jacqulynn Cadet M.D.   On: 04/18/2020 11:52   NM Special Med Rad Physics Cons  Result Date: 04/18/2020 CLINICAL DATA:  Recurrent hepatocellular carcinoma. First treatment to left lobe of liver EXAM: NUCLEAR  MEDICINE SPECIAL MED RAD PHYSICS CONS; NUCLEAR MEDICINE RADIO PHARM THERAPY INTRA ARTERIAL; NUCLEAR MEDICINE TREATMENT PROCEDURE; NUCLEAR MEDICINE LIVER SCAN TECHNIQUE: In conjunction with the interventional radiologist a Y- Microsphere dose was calculated utilizing body surface area formulation. Calculated dose equal 16 mCi. Pre therapy MAA liver SPECT scan and CTA were evaluated. Utilizing a microcatheter system, the hepatic artery was selected and Y-90 microspheres were delivered in fractionated aliquots. Radiopharmaceutical was delivered by the interventional radiologist and nuclear radiologist. The patient tolerated procedure well. No adverse effects were noted. Bremsstrahlung planar and SPECT imaging of the abdomen following intrahepatic arterial delivery of Y-90 microsphere was performed. RADIOPHARMACEUTICALS:  14.40 MCI SIR-SPHERES Y-90 microspheres COMPARISON:  02/01/2020. FINDINGS: Y - 90 microspheres therapy as above. First therapy the left hepatic lobe. Bremsstrahlung planar and SPECT imaging of the abdomen following intrahepatic arterial delivery of Y-90 microsphere demonstrates radioactivity localized to the left hepatic lobe. No evidence of extrahepatic activity.  IMPRESSION: Successful Y - 90 microsphere delivery for treatment of unresectable liver metastasis. First therapy to the left lobe. Bremssstrahlung scan demonstrates activity localized to left hepatic lobe with no extrahepatic activity identified. Electronically Signed   By: Kerby Moors M.D.   On: 04/18/2020 14:07   NM Special Treatment Procedure  Result Date: 04/18/2020 CLINICAL DATA:  Recurrent hepatocellular carcinoma. First treatment to left lobe of liver EXAM: NUCLEAR MEDICINE SPECIAL MED RAD PHYSICS CONS; NUCLEAR MEDICINE RADIO PHARM THERAPY INTRA ARTERIAL; NUCLEAR MEDICINE TREATMENT PROCEDURE; NUCLEAR MEDICINE LIVER SCAN TECHNIQUE: In conjunction with the interventional radiologist a Y- Microsphere dose was calculated utilizing  body surface area formulation. Calculated dose equal 16 mCi. Pre therapy MAA liver SPECT scan and CTA were evaluated. Utilizing a microcatheter system, the hepatic artery was selected and Y-90 microspheres were delivered in fractionated aliquots. Radiopharmaceutical was delivered by the interventional radiologist and nuclear radiologist. The patient tolerated procedure well. No adverse effects were noted. Bremsstrahlung planar and SPECT imaging of the abdomen following intrahepatic arterial delivery of Y-90 microsphere was performed. RADIOPHARMACEUTICALS:  14.40 MCI SIR-SPHERES Y-90 microspheres COMPARISON:  02/01/2020. FINDINGS: Y - 90 microspheres therapy as above. First therapy the left hepatic lobe. Bremsstrahlung planar and SPECT imaging of the abdomen following intrahepatic arterial delivery of Y-90 microsphere demonstrates radioactivity localized to the left hepatic lobe. No evidence of extrahepatic activity. IMPRESSION: Successful Y - 90 microsphere delivery for treatment of unresectable liver metastasis. First therapy to the left lobe. Bremssstrahlung scan demonstrates activity localized to left hepatic lobe with no extrahepatic activity identified. Electronically Signed   By: Kerby Moors M.D.   On: 04/18/2020 14:07   IR US Guide Vasc Access Left  Result Date: 04/18/2020 INDICATION: 64 year old male with multifocal hepatocellular carcinoma. He presents today for left segment two trans arterial radio embolization with Y 90. EXAM: 1. Ultrasound-guided vascular access left radial artery 2. Catheterization of the celiac artery with arteriogram 3. Catheterization of the common hepatic artery with arteriogram 4. Catheterization of the segment 2 hepatic artery with arteriogram 5. Trans arterial radio embolization MEDICATIONS: 3.375 g Zosyn. 3000 units heparin, 400 mcg nitroglycerin and 5 mg verapamil. The antibiotic was administered within 1 hour of the procedure ANESTHESIA/SEDATION: Moderate (conscious)  sedation was employed during this procedure. A total of Versed 2 mg and Fentanyl 150 mcg and 37.5 mg Benadryl was administered intravenously. Moderate Sedation Time: 46 minutes. The patient's level of consciousness and vital signs were monitored continuously by radiology nursing throughout the procedure under my direct supervision. CONTRAST:  40 mL Omnipaque 300 FLUOROSCOPY TIME:  Fluoroscopy Time: 4 minutes 36 seconds (436 mGy). COMPLICATIONS: None immediate. PROCEDURE: Informed consent was obtained from the patient following explanation of the procedure, risks, benefits and alternatives. The patient understands, agrees and consents for the procedure. All questions were addressed. A time out was performed prior to the initiation of the procedure. Maximal barrier sterile technique utilized including caps, mask, sterile gowns, sterile gloves, large sterile drape, hand hygiene, and Betadine prep. The left radial artery was interrogated with ultrasound and found to be widely patent and of adequate diameter. An image was obtained and stored for the medical record. Local anesthesia was attained by infiltration with 1% lidocaine. Under real-time sonographic guidance, the vessel was punctured with a 21 gauge micropuncture needle. Using standard technique, the initial micro needle was exchanged over a 0.021 micro wire for a hydrophilic 5 French slim arterial sheath. The sheath was flushed and then a radial cocktail consisting of 5000 units heparin, 2.5  mg Verapamil and 200 mcg nitroglycerine was injected through the sheath. An ultimate 1 catheter was advanced over a Bentson wire down the abdominal aorta. The catheter was used to select the celiac artery. A contrast injection was performed confirming patency of the celiac artery. The ultimate 1 catheter was then advanced into the common hepatic artery over a glidewire. Arteriography was performed confirming successful and persistent occlusion of the gastroduodenal artery and  the right gastric artery. No interval development of a new gastric or duodenal collaterals. A high-flow Renegade microcatheter was then advanced over a Fathom 16 wire and used to select the segment 2 hepatic artery. Arteriography was performed confirming catheter position and also demonstrating hypervascular tumor blush in hepatic segment 2. The catheter was secured in place. Trans arterial radio embolization was then performed using resin Y 90 microspheres in the standard fashion. Spheres were administered in small aliquots and antegrade flow was assessed intermittently throughout the procedure and again following completion of the embolization. No evidence of stasis. The catheters were removed and disposed of appropriately. Hemostasis was attained with the assistance of a TR band. IMPRESSION: Successful segment 2 trans arterial radio embolization with Y 90 microspheres. Signed, Criselda Peaches, MD, [facility] Radiology Electronically Signed   By: Jacqulynn Cadet M.D.   On: 04/18/2020 11:52   IR EMBO TUMOR ORGAN ISCHEMIA INFARCT INC GUIDE ROADMAPPING  Result Date: 04/18/2020 INDICATION: 64 year old male with multifocal hepatocellular carcinoma. He presents today for left segment two trans arterial radio embolization with Y 90. EXAM: 1. Ultrasound-guided vascular access left radial artery 2. Catheterization of the celiac artery with arteriogram 3. Catheterization of the common hepatic artery with arteriogram 4. Catheterization of the segment 2 hepatic artery with arteriogram 5. Trans arterial radio embolization MEDICATIONS: 3.375 g Zosyn. 3000 units heparin, 400 mcg nitroglycerin and 5 mg verapamil. The antibiotic was administered within 1 hour of the procedure ANESTHESIA/SEDATION: Moderate (conscious) sedation was employed during this procedure. A total of Versed 2 mg and Fentanyl 150 mcg and 37.5 mg Benadryl was administered intravenously. Moderate  Sedation Time: 46 minutes. The patient's level of consciousness and vital signs were monitored continuously by radiology nursing throughout the procedure under my direct supervision. CONTRAST:  40 mL Omnipaque 300 FLUOROSCOPY TIME:  Fluoroscopy Time: 4 minutes 36 seconds (436 mGy). COMPLICATIONS: None immediate. PROCEDURE: Informed consent was obtained from the patient following explanation of the procedure, risks, benefits and alternatives. The patient understands, agrees and consents for the procedure. All questions were addressed. A time out was performed prior to the initiation of the procedure. Maximal barrier sterile technique utilized including caps, mask, sterile gowns, sterile gloves, large sterile drape, hand hygiene, and Betadine prep. The left radial artery was interrogated with ultrasound and found to be widely patent and of adequate diameter. An image was obtained and stored for the medical record. Local anesthesia was attained by infiltration with 1% lidocaine. Under real-time sonographic guidance, the vessel was punctured with a 21 gauge micropuncture needle. Using standard technique, the initial micro needle was exchanged over a 0.021 micro wire for a hydrophilic 5 French slim arterial sheath. The sheath was flushed and then a radial cocktail consisting of 5000 units heparin, 2.5 mg Verapamil and 200 mcg nitroglycerine was injected through the sheath. An ultimate 1 catheter was advanced over a Bentson wire down the abdominal aorta. The catheter was used to select the celiac artery. A contrast injection was performed confirming patency of the celiac artery. The ultimate  1 catheter was then advanced into the common hepatic artery over a glidewire. Arteriography was performed confirming successful and persistent occlusion of the gastroduodenal artery and the right gastric artery. No interval development of a new gastric or duodenal collaterals. A high-flow Renegade microcatheter was then advanced over  a Fathom 16 wire and used to select the segment 2 hepatic artery. Arteriography was performed confirming catheter position and also demonstrating hypervascular tumor blush in hepatic segment 2. The catheter was secured in place. Trans arterial radio embolization was then performed using resin Y 90 microspheres in the standard fashion. Spheres were administered in small aliquots and antegrade flow was assessed intermittently throughout the procedure and again following completion of the embolization. No evidence of stasis. The catheters were removed and disposed of appropriately. Hemostasis was attained with the assistance of a TR band. IMPRESSION: Successful segment 2 trans arterial radio embolization with Y 90 microspheres. Signed, Criselda Peaches, MD, [place] [facility] Radiology Electronically Signed   By: Jacqulynn Cadet M.D.   On: 04/18/2020 11:52   NM Radio Pharm Therapy Intraarterial  Result Date: 04/18/2020 CLINICAL DATA:  Recurrent hepatocellular carcinoma. First treatment to left lobe of liver EXAM: NUCLEAR MEDICINE SPECIAL MED RAD PHYSICS CONS; NUCLEAR MEDICINE RADIO PHARM THERAPY INTRA ARTERIAL; NUCLEAR MEDICINE TREATMENT PROCEDURE; NUCLEAR MEDICINE LIVER SCAN TECHNIQUE: In conjunction with the interventional radiologist a Y- Microsphere dose was calculated utilizing body surface area formulation. Calculated dose equal 16 mCi. Pre therapy MAA liver SPECT scan and CTA were evaluated. Utilizing a microcatheter system, the hepatic artery was selected and Y-90 microspheres were delivered in fractionated aliquots. Radiopharmaceutical was delivered by the interventional radiologist and nuclear radiologist. The patient tolerated procedure well. No adverse effects were noted. Bremsstrahlung planar and SPECT imaging of the abdomen following intrahepatic arterial delivery of Y-90 microsphere was performed. RADIOPHARMACEUTICALS:  14.40 MCI SIR-SPHERES Y-90  microspheres COMPARISON:  02/01/2020. FINDINGS: Y - 90 microspheres therapy as above. First therapy the left hepatic lobe. Bremsstrahlung planar and SPECT imaging of the abdomen following intrahepatic arterial delivery of Y-90 microsphere demonstrates radioactivity localized to the left hepatic lobe. No evidence of extrahepatic activity. IMPRESSION: Successful Y - 90 microsphere delivery for treatment of unresectable liver metastasis. First therapy to the left lobe. Bremssstrahlung scan demonstrates activity localized to left hepatic lobe with no extrahepatic activity identified. Electronically Signed   By: Kerby Moors M.D.   On: 04/18/2020 14:07    Labs:  CBC: Recent Labs    03/10/20 0810 03/21/20 0812 04/18/20 0810 05/01/20 1016  WBC 3.3* 3.3* 4.6 5.2  HGB 15.7 14.8 15.6 14.7  HCT 47.5 45.4 47.8 45.3  PLT PLATELET CLUMPS NOTED ON SMEAR, COUNT APPEARS DECREASED 62* 61* 45*    COAGS: Recent Labs    01/24/20 1230 03/10/20 0810 04/18/20 0810 05/01/20 1016  INR 1.1 1.2 1.2 1.1    BMP: Recent Labs    03/10/20 0810 03/21/20 0812 04/18/20 0810 05/01/20 1016  NA 133* 137 138 139  K 3.2* 4.3 4.3 3.8  CL 101 103 107 105  CO2 22 25 23 25   GLUCOSE 103* 99 104* 88  BUN 13 12 15 8   CALCIUM 8.6* 9.0 8.9 8.9  CREATININE 1.04 0.86 0.88 0.79  GFRNONAA >60 >60 >60 94  GFRAA >60 >60 >60 109    LIVER FUNCTION TESTS: Recent Labs    03/10/20 0810 03/21/20 0812 04/18/20 0810 05/01/20 1016  BILITOT 1.2 1.0 1.1 1.2  AST 23 24 33 35  ALT  18 18 30  38  ALKPHOS 77 86 132*  --   PROT 7.1 6.7 7.1 6.0*  ALBUMIN 4.0 3.6 3.9  --     TUMOR MARKERS: Recent Labs    01/24/20 1230 05/01/20 1016  AFPTM 820.3* 94.7*    Assessment and Plan:  Jeffrey Snow is doing quite well 1 month status post transarterial radiation segmentectomy of segment 2 lesion and 2 months status post right lobar transarterial radio embolization.  He has no significant post embolization side effects or issues.   Markedly decreased AFP is a very promising indicator of a response to therapy.  1.)  Repeat MRI of the abdomen with gadolinium contrast in early July 2021, 3 months after initial radioembolization of the right hepatic lobe.  Clinic visit with liver labs including AFP at that time as well.   Electronically Signed: Jacqulynn Cadet 05/11/2020, 4:00 PM   I spent a total of  15 Minutes in remote  clinical consultation, greater than 50% of which was counseling/coordinating care for multifocal hepatocellular cancer.    Visit type: Audio only (telephone). Audio (no video) only due to patient preference. Alternative for in-person consultation at [facility], [address], [place], [place]. This visit type was conducted due to national recommendations for restrictions regarding the COVID-19 Pandemic (e.g. social distancing).  This format is felt to be most appropriate for this patient at this time.  All issues noted in this document were discussed and addressed.

## 2020-05-26 ENCOUNTER — Ambulatory Visit: Payer: Medicaid Other | Admitting: Family Medicine

## 2020-05-30 ENCOUNTER — Ambulatory Visit (INDEPENDENT_AMBULATORY_CARE_PROVIDER_SITE_OTHER): Payer: Medicare Other | Admitting: Family Medicine

## 2020-05-30 ENCOUNTER — Other Ambulatory Visit: Payer: Self-pay

## 2020-05-30 ENCOUNTER — Encounter: Payer: Self-pay | Admitting: Family Medicine

## 2020-05-30 DIAGNOSIS — F419 Anxiety disorder, unspecified: Secondary | ICD-10-CM

## 2020-05-30 DIAGNOSIS — G629 Polyneuropathy, unspecified: Secondary | ICD-10-CM | POA: Diagnosis not present

## 2020-05-30 DIAGNOSIS — R062 Wheezing: Secondary | ICD-10-CM | POA: Diagnosis not present

## 2020-05-30 DIAGNOSIS — I10 Essential (primary) hypertension: Secondary | ICD-10-CM

## 2020-05-30 DIAGNOSIS — Z8505 Personal history of malignant neoplasm of liver: Secondary | ICD-10-CM | POA: Diagnosis not present

## 2020-05-30 DIAGNOSIS — Z09 Encounter for follow-up examination after completed treatment for conditions other than malignant neoplasm: Secondary | ICD-10-CM

## 2020-05-30 DIAGNOSIS — K7031 Alcoholic cirrhosis of liver with ascites: Secondary | ICD-10-CM

## 2020-05-30 NOTE — Progress Notes (Addendum)
Virtual Visit via Telephone Note  I connected with Jeffrey Snow on 05/30/20 at  1:00 PM EDT by telephone and verified that I am speaking with the correct person using two identifiers.   I discussed the limitations, risks, security and privacy concerns of performing an evaluation and management service by telephone and the availability of in person appointments. I also discussed with the patient that there may be a patient responsible charge related to this service. The patient expressed understanding and agreed to proceed.  Televisit Today Patient Location: Home Provider Location: Office   History of Present Illness:  Past Surgical History:  Procedure Laterality Date  . ABDOMINAL AORTAGRAM N/A 12/27/2013   Procedure: ABDOMINAL Maxcine Ham;  Surgeon: Conrad [place], MD;  Location: [facility] CATH LAB;  Service: Cardiovascular;  Laterality: N/A;  . AORTOGRAM    . COSMETIC SURGERY     facial reconstructive surgery  . ESOPHAGOGASTRODUODENOSCOPY N/A 12/17/2014   Procedure: ESOPHAGOGASTRODUODENOSCOPY (EGD);  Surgeon: Missy Sabins, MD;  Location: [facility] ENDOSCOPY;  Service: Endoscopy;  Laterality: N/A;  . ESOPHAGOGASTRODUODENOSCOPY N/A 09/05/2015   Procedure: ESOPHAGOGASTRODUODENOSCOPY (EGD);  Surgeon: Wonda Horner, MD;  Location: [facility] ENDOS[facility];  Service: Endoscopy;  Laterality: N/A;  . IR ANGIOGRAM EXTREMITY RIGHT  03/21/2020  . IR ANGIOGRAM SELECTIVE EACH ADDITIONAL VESSEL  03/10/2020  . IR ANGIOGRAM SELECTIVE EACH ADDITIONAL VESSEL  03/10/2020  . IR ANGIOGRAM SELECTIVE EACH ADDITIONAL VESSEL  03/10/2020  . IR ANGIOGRAM SELECTIVE EACH ADDITIONAL VESSEL  03/10/2020  . IR ANGIOGRAM SELECTIVE EACH ADDITIONAL VESSEL  03/10/2020  . IR ANGIOGRAM SELECTIVE EACH ADDITIONAL VESSEL  03/21/2020  . IR ANGIOGRAM SELECTIVE EACH ADDITIONAL VESSEL  03/21/2020  . IR ANGIOGRAM SELECTIVE EACH ADDITIONAL VESSEL  03/21/2020  . IR ANGIOGRAM SELECTIVE EACH ADDITIONAL VESSEL  03/21/2020  . IR ANGIOGRAM SELECTIVE EACH ADDITIONAL VESSEL   03/21/2020  . IR ANGIOGRAM SELECTIVE EACH ADDITIONAL VESSEL  04/18/2020  . IR ANGIOGRAM SELECTIVE EACH ADDITIONAL VESSEL  04/18/2020  . IR ANGIOGRAM VISCERAL SELECTIVE  03/10/2020  . IR ANGIOGRAM VISCERAL SELECTIVE  03/21/2020  . IR ANGIOGRAM VISCERAL SELECTIVE  04/18/2020  . IR EMBO ARTERIAL NOT HEMORR HEMANG INC GUIDE ROADMAPPING  03/10/2020  . IR EMBO TUMOR ORGAN ISCHEMIA INFARCT INC GUIDE ROADMAPPING  03/21/2020  . IR EMBO TUMOR ORGAN ISCHEMIA INFARCT INC GUIDE ROADMAPPING  04/18/2020  . IR GENERIC HISTORICAL  01/19/2015   IR RADIOLOGIST EVAL & MGMT 01/19/2015 Jacqulynn Cadet, MD [facility] INTERV RAD  . IR GENERIC HISTORICAL  07/23/2016   IR RADIOLOGIST EVAL & MGMT 07/23/2016 Jacqulynn Cadet, MD [facility] INTERV RAD  . IR GENERIC HISTORICAL  05/30/2015   IR RADIOLOGIST EVAL & MGMT 05/30/2015 [facility] INTERV RAD  . IR GENERIC HISTORICAL  10/04/2014   IR RADIOLOGIST EVAL & MGMT 10/04/2014 Jacqulynn Cadet, MD [facility] INTERV RAD  . IR GENERIC HISTORICAL  02/11/2017   IR RADIOLOGIST EVAL & MGMT 02/11/2017 Jacqulynn Cadet, MD [facility] INTERV RAD  . IR RADIOLOGIST EVAL & MGMT  08/13/2017  . IR RADIOLOGIST EVAL & MGMT  08/11/2018  . IR RADIOLOGIST EVAL & MGMT  02/09/2019  . IR RADIOLOGIST EVAL & MGMT  02/10/2020  . IR RADIOLOGIST EVAL & MGMT  05/11/2020  . IR US GUIDE VASC ACCESS LEFT  03/10/2020  . IR US GUIDE VASC ACCESS LEFT  03/21/2020  . IR US GUIDE VASC ACCESS LEFT  04/18/2020  . IR US GUIDE VASC ACCESS RIGHT  03/21/2020  . LOWER EXTREMITY ANGIOGRAM Bilateral 12/27/2013   Procedure: LOWER EXTREMITY ANGIOGRAM;  Surgeon:  Conrad Bristow, MD;  Location: [facility] CATH LAB;  Service: Cardiovascular;  Laterality: Bilateral;    Social History   Socioeconomic History  . Marital status: Single    Spouse name: Not on file  . Number of children: 3  . Years of education: Not on file  . Highest education level: Not on file  Occupational History    Employer: [employer]  Tobacco Use  . Smoking status: Former Smoker    Packs/day: 0.50     Quit date: 08/21/2011    Years since quitting: 8.7  . Smokeless tobacco: Never Used  Vaping Use  . Vaping Use: Never used  Substance and Sexual Activity  . Alcohol use: Not on file    Comment: Occasional beer on the weekends  . Drug use: No    Types: Cocaine    Comment: abused cocaine in the 80's  . Sexual activity: Not Currently  Other Topics Concern  . Not on file  Social History Narrative   Single, never married.  Lives alone.  3 daughters, 16 grandchildren who are not very involved in the patient's care.  Quit smoking and heavy drinking 3.5 years ago.    Social Determinants of Health   Financial Resource Strain:   . Difficulty of Paying Living Expenses:   Food Insecurity:   . Worried About Charity fundraiser in the Last Year:   . Arboriculturist in the Last Year:   Transportation Needs:   . Film/video editor (Medical):   Marland Kitchen Lack of Transportation (Non-Medical):   Physical Activity:   . Days of Exercise per Week:   . Minutes of Exercise per Session:   Stress:   . Feeling of Stress :   Social Connections:   . Frequency of Communication with Friends and Family:   . Frequency of Social Gatherings with Friends and Family:   . Attends Religious Services:   . Active Member of Clubs or Organizations:   . Attends Archivist Meetings:   Marland Kitchen Marital Status:   Intimate Partner Violence:   . Fear of Current or Ex-Partner:   . Emotionally Abused:   Marland Kitchen Physically Abused:   . Sexually Abused:    Past Medical History:  Diagnosis Date  . Abnormal MRI of abdomen, liver  04/21/2014  . Anxiety   . Cirrhosis, alcoholic ([place]) 0/05/2375  . Claudication of left lower extremity (HCC) m-1  . Depression   . [place] (hepatocellular carcinoma) ([place])   . Hepatitis C   . Hypertension   . Liver mass   . Panic attack     Allergies  Allergen Reactions  . Folic Acid Other (See Comments)    Makes stomach feel funny  . Oxycodone Itching and Other (See Comments)    Cause insomnia     Patient Active Problem List   Diagnosis Date Noted  . Wheezing 11/28/2019  . Neuropathy 11/28/2019  . Anxiety 11/28/2019  . Acute upper GI bleed 09/05/2015  . Lactic acidosis 09/05/2015  . Hyperkalemia 09/05/2015  . Hemorrhagic shock ([place]) 09/05/2015  . Urinary tract infection, acute 08/31/2015  . Anemia, blood loss 12/16/2014  . Melena 12/16/2014  . Anemia 12/16/2014  . Abdominal pain, chronic, right upper quadrant 07/06/2014  . [place] (hepatocellular carcinoma) ([place]) 06/09/2014  . Other pancytopenia ([place]) 04/27/2014  . Cellulitis of leg 04/24/2014  . Elevated troponin 04/23/2014  . Normocytic anemia 04/21/2014  . Cirrhosis, alcoholic ([place]) 28/31/5176  . Hyponatremia 04/21/2014  . Hypokalemia 04/21/2014  .  Metabolic acidosis 41/28/7867  . Hepatitis C 04/21/2014  . Abnormal MRI of abdomen, liver  04/21/2014  . Leg pain 04/21/2014  . Rash and nonspecific skin eruption 04/21/2014  . T wave inversion in EKG 04/21/2014  . Right foot pain 03/31/2014  . Hyperglycemia 03/03/2014  . Vitamin D deficiency disease 03/03/2014  . HTN (hypertension) 03/03/2014  . Elevated LFTs 03/03/2014  . Screening for colon cancer 03/03/2014  . Annual physical exam 03/03/2014  . PVD (peripheral vascular disease) ([place]) 12/03/2013  . PAD (peripheral artery disease) ([place]) 10/26/2013  . Arterial insufficiency ([place]) 10/26/2013  . Fall 10/26/2013  . Depression 09/22/2013  . Insomnia 09/22/2013  . Claudication of left lower extremity ([place])   . Edema 08/20/2013  . Pain in limb 08/20/2013  . Atherosclerosis of native arteries of the extremities with intermittent claudication 08/20/2013  . Thrombocytopenia ([place]) 10/12/2012    Current Status: Since his last office visit, he continues to follow up with Oncology for Hepatocellular Carcinoma. He has follow up for MRI and office visit 08/2020. He denies fevers, chills, fatigue, recent infections, weight loss, and night sweats. He has not had any headaches,  visual changes, dizziness, and falls. No chest pain, heart palpitations, cough and shortness of breath reported. Denies GI problems such as nausea, vomiting, diarrhea, and constipation. He has no reports of blood in stools, dysuria and hematuria. No depression or anxiety, and denies suicidal ideations, homicidal ideations, or auditory hallucinations. He is taking all medications as prescribed. He denies pain today.    Observations/Objective:  Telephone Virtual Visit   Assessment and Plan:  1. History of hepatocellular carcinoma  2. Essential hypertension He will continue to take medications as prescribed, to decrease high sodium intake, excessive alcohol intake, increase potassium intake, smoking cessation, and increase physical activity of at least 30 minutes of cardio activity daily. He will continue to follow Heart Healthy or DASH diet.  3. Neuropathy  4. Wheezing Stable. Today.   5. Alcoholic cirrhosis of liver with ascites ([place])  6. Anxiety  7. Follow up He will follow up as needed.   No orders of the defined types were placed in this encounter.   No orders of the defined types were placed in this encounter.   Referral Orders  No referral(s) requested today    Kathe Becton,  MSN, FNP-BC [facility] [address] [place], [place] [address] [phone]- fax   I discussed the assessment and treatment plan with the patient. The patient was provided an opportunity to ask questions and all were answered. The patient agreed with the plan and demonstrated an understanding of the instructions.   The patient was advised to call back or seek an in-person evaluation if the symptoms worsen or if the condition fails to improve as anticipated.  I provided 20 minutes of non-face-to-face time during this encounter.   Azzie Glatter, FNP

## 2020-06-06 ENCOUNTER — Other Ambulatory Visit: Payer: Self-pay

## 2020-06-06 ENCOUNTER — Ambulatory Visit (INDEPENDENT_AMBULATORY_CARE_PROVIDER_SITE_OTHER): Payer: Medicare Other | Admitting: Family Medicine

## 2020-06-06 DIAGNOSIS — Z09 Encounter for follow-up examination after completed treatment for conditions other than malignant neoplasm: Secondary | ICD-10-CM

## 2020-06-06 DIAGNOSIS — Z8505 Personal history of malignant neoplasm of liver: Secondary | ICD-10-CM

## 2020-06-06 DIAGNOSIS — F419 Anxiety disorder, unspecified: Secondary | ICD-10-CM

## 2020-06-06 DIAGNOSIS — I10 Essential (primary) hypertension: Secondary | ICD-10-CM | POA: Diagnosis not present

## 2020-06-06 DIAGNOSIS — K7031 Alcoholic cirrhosis of liver with ascites: Secondary | ICD-10-CM

## 2020-06-06 DIAGNOSIS — G629 Polyneuropathy, unspecified: Secondary | ICD-10-CM | POA: Diagnosis not present

## 2020-06-06 MED ORDER — GABAPENTIN 300 MG PO CAPS: 300.0000 mg | ORAL_CAPSULE | Freq: Three times a day (TID) | ORAL | 6 refills | Status: AC

## 2020-06-06 NOTE — Progress Notes (Addendum)
Virtual Visit via Telephone Note  I connected with Jeffrey Snow on 06/06/20 at  8:20 AM EDT by telephone and verified that I am speaking with the correct person using two identifiers.   I discussed the limitations, risks, security and privacy concerns of performing an evaluation and management service by telephone and the availability of in person appointments. I also discussed with the patient that there may be a patient responsible charge related to this service. The patient expressed understanding and agreed to proceed.  Televisit Today Patient Location: Home Provider Location: Office  History of Present Illness:  Past Surgical History:  Procedure Laterality Date  . ABDOMINAL AORTAGRAM N/A 12/27/2013   Procedure: ABDOMINAL Maxcine Ham;  Surgeon: Conrad [place], MD;  Location: [facility] CATH LAB;  Service: Cardiovascular;  Laterality: N/A;  . AORTOGRAM    . COSMETIC SURGERY     facial reconstructive surgery  . ESOPHAGOGASTRODUODENOSCOPY N/A 12/17/2014   Procedure: ESOPHAGOGASTRODUODENOSCOPY (EGD);  Surgeon: Missy Sabins, MD;  Location: [facility] ENDOSCOPY;  Service: Endoscopy;  Laterality: N/A;  . ESOPHAGOGASTRODUODENOSCOPY N/A 09/05/2015   Procedure: ESOPHAGOGASTRODUODENOSCOPY (EGD);  Surgeon: Wonda Horner, MD;  Location: [facility] ENDOS[facility];  Service: Endoscopy;  Laterality: N/A;  . IR ANGIOGRAM EXTREMITY RIGHT  03/21/2020  . IR ANGIOGRAM SELECTIVE EACH ADDITIONAL VESSEL  03/10/2020  . IR ANGIOGRAM SELECTIVE EACH ADDITIONAL VESSEL  03/10/2020  . IR ANGIOGRAM SELECTIVE EACH ADDITIONAL VESSEL  03/10/2020  . IR ANGIOGRAM SELECTIVE EACH ADDITIONAL VESSEL  03/10/2020  . IR ANGIOGRAM SELECTIVE EACH ADDITIONAL VESSEL  03/10/2020  . IR ANGIOGRAM SELECTIVE EACH ADDITIONAL VESSEL  03/21/2020  . IR ANGIOGRAM SELECTIVE EACH ADDITIONAL VESSEL  03/21/2020  . IR ANGIOGRAM SELECTIVE EACH ADDITIONAL VESSEL  03/21/2020  . IR ANGIOGRAM SELECTIVE EACH ADDITIONAL VESSEL  03/21/2020  . IR ANGIOGRAM SELECTIVE EACH ADDITIONAL VESSEL   03/21/2020  . IR ANGIOGRAM SELECTIVE EACH ADDITIONAL VESSEL  04/18/2020  . IR ANGIOGRAM SELECTIVE EACH ADDITIONAL VESSEL  04/18/2020  . IR ANGIOGRAM VISCERAL SELECTIVE  03/10/2020  . IR ANGIOGRAM VISCERAL SELECTIVE  03/21/2020  . IR ANGIOGRAM VISCERAL SELECTIVE  04/18/2020  . IR EMBO ARTERIAL NOT HEMORR HEMANG INC GUIDE ROADMAPPING  03/10/2020  . IR EMBO TUMOR ORGAN ISCHEMIA INFARCT INC GUIDE ROADMAPPING  03/21/2020  . IR EMBO TUMOR ORGAN ISCHEMIA INFARCT INC GUIDE ROADMAPPING  04/18/2020  . IR GENERIC HISTORICAL  01/19/2015   IR RADIOLOGIST EVAL & MGMT 01/19/2015 Jacqulynn Cadet, MD [facility] INTERV RAD  . IR GENERIC HISTORICAL  07/23/2016   IR RADIOLOGIST EVAL & MGMT 07/23/2016 Jacqulynn Cadet, MD [facility] INTERV RAD  . IR GENERIC HISTORICAL  05/30/2015   IR RADIOLOGIST EVAL & MGMT 05/30/2015 [facility] INTERV RAD  . IR GENERIC HISTORICAL  10/04/2014   IR RADIOLOGIST EVAL & MGMT 10/04/2014 Jacqulynn Cadet, MD [facility] INTERV RAD  . IR GENERIC HISTORICAL  02/11/2017   IR RADIOLOGIST EVAL & MGMT 02/11/2017 Jacqulynn Cadet, MD [facility] INTERV RAD  . IR RADIOLOGIST EVAL & MGMT  08/13/2017  . IR RADIOLOGIST EVAL & MGMT  08/11/2018  . IR RADIOLOGIST EVAL & MGMT  02/09/2019  . IR RADIOLOGIST EVAL & MGMT  02/10/2020  . IR RADIOLOGIST EVAL & MGMT  05/11/2020  . IR US GUIDE VASC ACCESS LEFT  03/10/2020  . IR US GUIDE VASC ACCESS LEFT  03/21/2020  . IR US GUIDE VASC ACCESS LEFT  04/18/2020  . IR US GUIDE VASC ACCESS RIGHT  03/21/2020  . LOWER EXTREMITY ANGIOGRAM Bilateral 12/27/2013   Procedure: LOWER EXTREMITY ANGIOGRAM;  Surgeon: Aaron Edelman  Starlyn Skeans, MD;  Location: [facility] CATH LAB;  Service: Cardiovascular;  Laterality: Bilateral;    Social History   Socioeconomic History  . Marital status: Single    Spouse name: Not on file  . Number of children: 3  . Years of education: Not on file  . Highest education level: Not on file  Occupational History    Employer: [employer]  Tobacco Use  . Smoking status: Former Smoker    Packs/day: 0.50     Quit date: 08/21/2011    Years since quitting: 8.8  . Smokeless tobacco: Never Used  Vaping Use  . Vaping Use: Never used  Substance and Sexual Activity  . Alcohol use: Not on file    Comment: Occasional beer on the weekends  . Drug use: No    Types: Cocaine    Comment: abused cocaine in the 80's  . Sexual activity: Not Currently  Other Topics Concern  . Not on file  Social History Narrative   Single, never married.  Lives alone.  3 daughters, 16 grandchildren who are not very involved in the patient's care.  Quit smoking and heavy drinking 3.5 years ago.    Social Determinants of Health   Financial Resource Strain:   . Difficulty of Paying Living Expenses:   Food Insecurity:   . Worried About Charity fundraiser in the Last Year:   . Arboriculturist in the Last Year:   Transportation Needs:   . Film/video editor (Medical):   Marland Kitchen Lack of Transportation (Non-Medical):   Physical Activity:   . Days of Exercise per Week:   . Minutes of Exercise per Session:   Stress:   . Feeling of Stress :   Social Connections:   . Frequency of Communication with Friends and Family:   . Frequency of Social Gatherings with Friends and Family:   . Attends Religious Services:   . Active Member of Clubs or Organizations:   . Attends Archivist Meetings:   Marland Kitchen Marital Status:   Intimate Partner Violence:   . Fear of Current or Ex-Partner:   . Emotionally Abused:   Marland Kitchen Physically Abused:   . Sexually Abused:     Family History  Problem Relation Age of Onset  . Diabetes Father   . Diabetes Brother   . Diabetes Brother     Past Medical History:  Diagnosis Date  . Abnormal MRI of abdomen, liver  04/21/2014  . Anxiety   . Cirrhosis, alcoholic ([place]) 07/21/7618  . Claudication of left lower extremity (HCC) m-1  . Depression   . [place] (hepatocellular carcinoma) ([place])   . Hepatitis C   . Hypertension   . Liver mass   . Panic attack     Allergies  Allergen Reactions  . Folic Acid  Other (See Comments)    Makes stomach feel funny  . Oxycodone Itching and Other (See Comments)    Cause insomnia    Patient Active Problem List   Diagnosis Date Noted  . Wheezing 11/28/2019  . Neuropathy 11/28/2019  . Anxiety 11/28/2019  . Acute upper GI bleed 09/05/2015  . Lactic acidosis 09/05/2015  . Hyperkalemia 09/05/2015  . Hemorrhagic shock ([place]) 09/05/2015  . Urinary tract infection, acute 08/31/2015  . Anemia, blood loss 12/16/2014  . Melena 12/16/2014  . Anemia 12/16/2014  . Abdominal pain, chronic, right upper quadrant 07/06/2014  . [place] (hepatocellular carcinoma) ([place]) 06/09/2014  . Other pancytopenia ([place]) 04/27/2014  . Cellulitis of leg  04/24/2014  . Elevated troponin 04/23/2014  . Normocytic anemia 04/21/2014  . Cirrhosis, alcoholic ([place]) 12/45/8099  . Hyponatremia 04/21/2014  . Hypokalemia 04/21/2014  . Metabolic acidosis 83/38/2505  . Hepatitis C 04/21/2014  . Abnormal MRI of abdomen, liver  04/21/2014  . Leg pain 04/21/2014  . Rash and nonspecific skin eruption 04/21/2014  . T wave inversion in EKG 04/21/2014  . Right foot pain 03/31/2014  . Hyperglycemia 03/03/2014  . Vitamin D deficiency disease 03/03/2014  . HTN (hypertension) 03/03/2014  . Elevated LFTs 03/03/2014  . Screening for colon cancer 03/03/2014  . Annual physical exam 03/03/2014  . PVD (peripheral vascular disease) ([place]) 12/03/2013  . PAD (peripheral artery disease) ([place]) 10/26/2013  . Arterial insufficiency ([place]) 10/26/2013  . Fall 10/26/2013  . Depression 09/22/2013  . Insomnia 09/22/2013  . Claudication of left lower extremity ([place])   . Edema 08/20/2013  . Pain in limb 08/20/2013  . Atherosclerosis of native arteries of the extremities with intermittent claudication 08/20/2013  . Thrombocytopenia ([place]) 10/12/2012   Current Status: Since his last office visit, he has c/o peripheral neuropathy, mainly in his feet. He states that Gabapentin is not effective. He continues to follow  up with Dr. Burr Medico in Oncology for Hepatocellular Carcinoma. His last visit with me was a Televisit,  last week on 05/30/2020. His anxiety was very high at that time. Today, his anxiety is moderate r/t his recently diagnosis and he discussed his options. He denies suicidal ideations, homicidal ideations, or auditory hallucinations. He denies fevers, chills, fatigue, recent infections, weight loss, and night sweats. He has not had any headaches, visual changes, dizziness, and falls. No chest pain, heart palpitations, cough and shortness of breath reported. Denies GI problems such as nausea, vomiting, diarrhea, and constipation. He has no reports of blood in stools, dysuria and hematuria. He is taking all medications as prescribed.   Observations/Objective:  Telephone Virtual Visit   Assessment and Plan:  1. History of hepatocellular carcinoma Continue to follow up with Dr. Burr Medico as needed.   2. Alcoholic cirrhosis of liver with ascites ([place])  3. Essential hypertension He will continue to take medications as prescribed, to decrease high sodium intake, excessive alcohol intake, increase potassium intake, smoking cessation, and increase physical activity of at least 30 minutes of cardio activity daily. He will continue to follow Heart Healthy or DASH diet.  4. Neuropathy We will increase dosage today.  - gabapentin (NEURONTIN) 300 MG capsule; Take 1 capsule (300 mg total) by mouth 3 (three) times daily.  Dispense: 90 capsule; Refill: 6  5. Anxiety Stable today.   6. Follow up He will follow up for office visit in 3 months.     Meds ordered this encounter  Medications  . gabapentin (NEURONTIN) 300 MG capsule    Sig: Take 1 capsule (300 mg total) by mouth 3 (three) times daily.    Dispense:  90 capsule    Refill:  6    No orders of the defined types were placed in this encounter.   Referral Orders  No referral(s) requested today    Kathe Becton,  MSN, FNP-BC [facility] [address] [place], [place] [address] [phone]- fax  I discussed the assessment and treatment plan with the patient. The patient was provided an opportunity to ask questions and all were answered. The patient agreed with the plan and demonstrated an understanding of the instructions.   The  patient was advised to call back or seek an in-person evaluation if the symptoms worsen or if the condition fails to improve as anticipated.  I provided 25 minutes of non-face-to-face time during this encounter.   Azzie Glatter, FNP

## 2020-06-08 ENCOUNTER — Telehealth: Payer: Self-pay | Admitting: General Practice

## 2020-06-08 NOTE — Telephone Encounter (Signed)
[place] CSW Progress Notes  Request received from Providence Crosby NP to connect w patient re anxiety.  No answer.  Called and left VM w my contact information and encouragement to call back.  Edwyna Shell, LCSW Clinical Social Worker Phone:  [phone] Cell:  [phone]

## 2020-06-09 ENCOUNTER — Telehealth: Payer: Self-pay | Admitting: General Practice

## 2020-06-09 NOTE — Telephone Encounter (Signed)
[place] CSW Progress Notes  Patient does not want any referrals for mental health needs.  He would like "somebody to call Dr Nancy Fetter" and intervene on his behalf re his need for pain control.  Edwyna Shell, LCSW Clinical Social Worker Phone:  [phone] Cell:  [phone]
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
## 2020-06-25 ENCOUNTER — Other Ambulatory Visit: Payer: Self-pay | Admitting: Family Medicine

## 2020-07-03 ENCOUNTER — Telehealth: Payer: Self-pay | Admitting: Family Medicine

## 2020-07-04 ENCOUNTER — Other Ambulatory Visit: Payer: Self-pay | Admitting: Family Medicine

## 2020-07-04 DIAGNOSIS — R062 Wheezing: Secondary | ICD-10-CM

## 2020-07-04 MED ORDER — ALBUTEROL SULFATE HFA 108 (90 BASE) MCG/ACT IN AERS: 2.0000 | INHALATION_SPRAY | Freq: Four times a day (QID) | RESPIRATORY_TRACT | 11 refills | Status: AC | PRN

## 2020-07-04 NOTE — Telephone Encounter (Signed)
Done

## 2020-07-19 ENCOUNTER — Other Ambulatory Visit: Payer: Self-pay | Admitting: Interventional Radiology

## 2020-07-19 DIAGNOSIS — C220 Liver cell carcinoma: Secondary | ICD-10-CM

## 2020-07-26 ENCOUNTER — Other Ambulatory Visit: Payer: Self-pay | Admitting: *Deleted

## 2020-07-26 DIAGNOSIS — C220 Liver cell carcinoma: Secondary | ICD-10-CM

## 2020-08-04 LAB — COMPLETE METABOLIC PANEL WITH GFR
AG Ratio: 1.5 (calc) (ref 1.0–2.5)
ALT: 16 U/L (ref 9–46)
AST: 22 U/L (ref 10–35)
Albumin: 3.5 g/dL — ABNORMAL LOW (ref 3.6–5.1)
Alkaline phosphatase (APISO): 133 U/L (ref 35–144)
BUN: 7 mg/dL (ref 7–25)
CO2: 27 mmol/L (ref 20–32)
Calcium: 8.4 mg/dL — ABNORMAL LOW (ref 8.6–10.3)
Chloride: 104 mmol/L (ref 98–110)
Creat: 0.93 mg/dL (ref 0.70–1.25)
GFR, Est African American: 99 mL/min/{1.73_m2} (ref >=60)
GFR, Est Non African American: 86 mL/min/{1.73_m2} (ref >=60)
Globulin: 2.3 g/dL (calc) (ref 1.9–3.7)
Glucose, Bld: 95 mg/dL (ref 65–139)
Potassium: 3.1 mmol/L — ABNORMAL LOW (ref 3.5–5.3)
Sodium: 138 mmol/L (ref 135–146)
Total Bilirubin: 2.5 mg/dL — ABNORMAL HIGH (ref 0.2–1.2)
Total Protein: 5.8 g/dL — ABNORMAL LOW (ref 6.1–8.1)

## 2020-08-04 LAB — CBC
HCT: 43.6 % (ref 38.5–50.0)
Hemoglobin: 14.6 g/dL (ref 13.2–17.1)
MCH: 31.3 pg (ref 27.0–33.0)
MCHC: 33.5 g/dL (ref 32.0–36.0)
MCV: 93.4 fL (ref 80.0–100.0)
Platelets: 40 10*3/uL — ABNORMAL LOW (ref 140–400)
RBC: 4.67 10*6/uL (ref 4.20–5.80)
RDW: 13.7 % (ref 11.0–15.0)
WBC: 3.3 10*3/uL — ABNORMAL LOW (ref 3.8–10.8)

## 2020-08-04 LAB — PROTIME-INR
INR: 1.3 — ABNORMAL HIGH
Prothrombin Time: 13.5 s — ABNORMAL HIGH (ref 9.0–11.5)

## 2020-08-04 LAB — AFP TUMOR MARKER: AFP-Tumor Marker: 20 ng/mL — ABNORMAL HIGH (ref <6.1)

## 2020-08-07 ENCOUNTER — Ambulatory Visit (HOSPITAL_COMMUNITY)
Admission: RE | Admit: 2020-08-07 | Discharge: 2020-08-07 | Disposition: A | Discharge status: Home / Self Care | Payer: Medicare Other | Source: Ambulatory Visit | Attending: Interventional Radiology | Admitting: Interventional Radiology

## 2020-08-07 ENCOUNTER — Other Ambulatory Visit: Payer: Self-pay

## 2020-08-07 DIAGNOSIS — C220 Liver cell carcinoma: Secondary | ICD-10-CM

## 2020-08-07 MED ORDER — GADOBUTROL 1 MMOL/ML IV SOLN
8.0000 mL | Freq: Once | INTRAVENOUS | Status: AC | PRN
  Administered 2020-08-07: 8 mL via INTRAVENOUS

## 2020-08-09 ENCOUNTER — Ambulatory Visit
Admission: RE | Admit: 2020-08-09 | Discharge: 2020-08-09 | Disposition: A | Discharge status: Home / Self Care | Payer: Medicare Other | Source: Ambulatory Visit | Attending: Interventional Radiology | Admitting: Interventional Radiology

## 2020-08-09 ENCOUNTER — Other Ambulatory Visit: Payer: Self-pay

## 2020-08-09 DIAGNOSIS — C220 Liver cell carcinoma: Secondary | ICD-10-CM

## 2020-08-09 HISTORY — PX: IR RADIOLOGIST EVAL & MGMT: IMG5224

## 2020-08-09 NOTE — Progress Notes (Signed)
Chief Complaint: Patient was consulted remotely today (TeleHealth) for multifocal hepatocellular carcinoma at the request of [place].    Referring Physician(s): Gus Magrinat, MD  History of Present Illness: Jeffrey Snow is a 63 y.o. male withalcoholic and HCV cirrhosis complicated by hepatocellular carcinoma, ascites and esophageal varices. He was treated by combination drug-eluting bead transarterial chemoembolization followed by microwave thermal ablation on 6/25 and 06/10/2014.  After doing well for just over 5 years, he developed a small LR 3 lesion which we were keeping an eye on.  On routine surveillance, his alpha-fetoprotein spiked to 820.3 compared to 132 last year. An MRI dated 02/01/2020 demonstrates significant interval enlargement of previously noted LR-3 lesions. He also has at least 2 new lesions. He has at least 4 lesions, 3 in the right hemiliver and one in the left. His largest individual lesion measures up to 4.4 cm in segment 7 of the hepatic dome.  Given progression to multifocal hepatocellular carcinoma, he underwent transarterial radio embolization with right lobar treatment on 03/21/2020 followed by a segment 2 segmentectomy on 04/18/2020.   He remains asymptomatic and denies abdominal pain, flank pain or other systemic symptoms.  70-month follow-up MRI dated 08/07/2020 demonstrates a marked response to therapy with LR TR (equivocal).  Even more reassuring, his AFP has decreased from a maximum over 800 to only 20.  He continues to do well clinically.  His appetite remains good.  He does have some mild fatigue.  No notable abdominal distention, abdominal pain, nausea, vomiting, fever or chills.  Past Medical History:  Diagnosis Date  . Abnormal MRI of abdomen, liver  04/21/2014  . Anxiety   . Cirrhosis, alcoholic ([place]) 0/01/5851  . Claudication of left lower extremity (HCC) m-1  . Depression   . [place] (hepatocellular carcinoma) ([place])   . Hepatitis C    . Hypertension   . Liver mass   . Panic attack     Past Surgical History:  Procedure Laterality Date  . ABDOMINAL AORTAGRAM N/A 12/27/2013   Procedure: ABDOMINAL Maxcine Ham;  Surgeon: Conrad , MD;  Location: [facility] CATH LAB;  Service: Cardiovascular;  Laterality: N/A;  . AORTOGRAM    . COSMETIC SURGERY     facial reconstructive surgery  . ESOPHAGOGASTRODUODENOSCOPY N/A 12/17/2014   Procedure: ESOPHAGOGASTRODUODENOSCOPY (EGD);  Surgeon: Missy Sabins, MD;  Location: [facility] ENDOSCOPY;  Service: Endoscopy;  Laterality: N/A;  . ESOPHAGOGASTRODUODENOSCOPY N/A 09/05/2015   Procedure: ESOPHAGOGASTRODUODENOSCOPY (EGD);  Surgeon: Wonda Horner, MD;  Location: [facility] ENDOSCOPY;  Service: Endoscopy;  Laterality: N/A;  . IR ANGIOGRAM EXTREMITY RIGHT  03/21/2020  . IR ANGIOGRAM SELECTIVE EACH ADDITIONAL VESSEL  03/10/2020  . IR ANGIOGRAM SELECTIVE EACH ADDITIONAL VESSEL  03/10/2020  . IR ANGIOGRAM SELECTIVE EACH ADDITIONAL VESSEL  03/10/2020  . IR ANGIOGRAM SELECTIVE EACH ADDITIONAL VESSEL  03/10/2020  . IR ANGIOGRAM SELECTIVE EACH ADDITIONAL VESSEL  03/10/2020  . IR ANGIOGRAM SELECTIVE EACH ADDITIONAL VESSEL  03/21/2020  . IR ANGIOGRAM SELECTIVE EACH ADDITIONAL VESSEL  03/21/2020  . IR ANGIOGRAM SELECTIVE EACH ADDITIONAL VESSEL  03/21/2020  . IR ANGIOGRAM SELECTIVE EACH ADDITIONAL VESSEL  03/21/2020  . IR ANGIOGRAM SELECTIVE EACH ADDITIONAL VESSEL  03/21/2020  . IR ANGIOGRAM SELECTIVE EACH ADDITIONAL VESSEL  04/18/2020  . IR ANGIOGRAM SELECTIVE EACH ADDITIONAL VESSEL  04/18/2020  . IR ANGIOGRAM VISCERAL SELECTIVE  03/10/2020  . IR ANGIOGRAM VISCERAL SELECTIVE  03/21/2020  . IR ANGIOGRAM VISCERAL SELECTIVE  04/18/2020  . IR EMBO ARTERIAL NOT [place]  03/10/2020  . IR EMBO TUMOR ORGAN ISCHEMIA INFARCT INC GUIDE ROADMAPPING  03/21/2020  . IR EMBO TUMOR ORGAN ISCHEMIA INFARCT INC GUIDE ROADMAPPING  04/18/2020  . IR GENERIC HISTORICAL  01/19/2015   IR RADIOLOGIST EVAL & MGMT 01/19/2015 Jacqulynn Cadet, MD [facility]  INTERV RAD  . IR GENERIC HISTORICAL  07/23/2016   IR RADIOLOGIST EVAL & MGMT 07/23/2016 Jacqulynn Cadet, MD [facility] INTERV RAD  . IR GENERIC HISTORICAL  05/30/2015   IR RADIOLOGIST EVAL & MGMT 05/30/2015 [facility] INTERV RAD  . IR GENERIC HISTORICAL  10/04/2014   IR RADIOLOGIST EVAL & MGMT 10/04/2014 Jacqulynn Cadet, MD [facility] INTERV RAD  . IR GENERIC HISTORICAL  02/11/2017   IR RADIOLOGIST EVAL & MGMT 02/11/2017 Jacqulynn Cadet, MD [facility] INTERV RAD  . IR RADIOLOGIST EVAL & MGMT  08/13/2017  . IR RADIOLOGIST EVAL & MGMT  08/11/2018  . IR RADIOLOGIST EVAL & MGMT  02/09/2019  . IR RADIOLOGIST EVAL & MGMT  02/10/2020  . IR RADIOLOGIST EVAL & MGMT  05/11/2020  . IR US GUIDE VASC ACCESS LEFT  03/10/2020  . IR US GUIDE VASC ACCESS LEFT  03/21/2020  . IR US GUIDE VASC ACCESS LEFT  04/18/2020  . IR US GUIDE VASC ACCESS RIGHT  03/21/2020  . LOWER EXTREMITY ANGIOGRAM Bilateral 12/27/2013   Procedure: LOWER EXTREMITY ANGIOGRAM;  Surgeon: Conrad Turah, MD;  Location: [facility] CATH LAB;  Service: Cardiovascular;  Laterality: Bilateral;    Allergies: Folic acid and Oxycodone  Medications: Prior to Admission medications   Medication Sig Start Date End Date Taking? Authorizing Provider  albuterol (VENTOLIN HFA) 108 (90 Base) MCG/ACT inhaler Inhale 2 puffs into the lungs every 6 (six) hours as needed for wheezing or shortness of breath. 07/04/20   Azzie Glatter, FNP  ALPRAZolam Duanne Moron) 0.5 MG tablet Take 0.5 mg by mouth 2 (two) times daily.     [provider]  bisacodyl (DULCOLAX) 5 MG EC tablet Take 5 mg by mouth daily as needed for mild constipation or moderate constipation. Reported on 04/03/2016    [provider]  furosemide (LASIX) 20 MG tablet TAKE 1 TABLET BY MOUTH EVERY MORNING 11/26/19   Azzie Glatter, FNP  furosemide (LASIX) 20 MG tablet TAKE 1 TABLET(20 MG) BY MOUTH DAILY 06/26/20   Azzie Glatter, FNP  gabapentin (NEURONTIN) 300 MG capsule Take 1 capsule (300 mg total) by mouth 3 (three)  times daily. 06/06/20   Azzie Glatter, FNP  hydrOXYzine (ATARAX/VISTARIL) 10 MG tablet Take 1 tablet (10 mg total) by mouth 3 (three) times daily as needed. 11/26/19   Azzie Glatter, FNP  pantoprazole (PROTONIX) 40 MG tablet TAKE 1 TABLET(40 MG) BY MOUTH TWICE DAILY BEFORE A MEAL 11/26/19   Azzie Glatter, FNP  spironolactone (ALDACTONE) 25 MG tablet TAKE 3 TABLETS(75 MG) BY MOUTH DAILY 04/03/20   Azzie Glatter, FNP  vitamin C (ASCORBIC ACID) 250 MG tablet Take 500 mg by mouth daily.    [provider]     Family History  Problem Relation Age of Onset  . Diabetes Father   . Diabetes Brother   . Diabetes Brother     Social History   Socioeconomic History  . Marital status: Single    Spouse name: Not on file  . Number of children: 3  . Years of education: Not on file  . Highest education level: Not on file  Occupational History    Employer: [employer]  Tobacco Use  . Smoking status:  Former Smoker    Packs/day: 0.50    Quit date: 08/21/2011    Years since quitting: 8.9  . Smokeless tobacco: Never Used  Vaping Use  . Vaping Use: Never used  Substance and Sexual Activity  . Alcohol use: Not on file    Comment: Occasional beer on the weekends  . Drug use: No    Types: Cocaine    Comment: abused cocaine in the 80's  . Sexual activity: Not Currently  Other Topics Concern  . Not on file  Social History Narrative   Single, never married.  Lives alone.  3 daughters, 16 grandchildren who are not very involved in the patient's care.  Quit smoking and heavy drinking 3.5 years ago.    Social Determinants of Health   Financial Resource Strain: High Risk  . Difficulty of Paying Living Expenses: Hard  Food Insecurity: No Food Insecurity  . Worried About Charity fundraiser in the Last Year: Never true  . Ran Out of Food in the Last Year: Never true  Transportation Needs: No Transportation Needs  . Lack of Transportation (Medical): No  . Lack of Transportation  (Non-Medical): No  Physical Activity: Inactive  . Days of Exercise per Week: 0 days  . Minutes of Exercise per Session: 0 min  Stress: Stress Concern Present  . Feeling of Stress : Very much  Social Connections:   . Frequency of Communication with Friends and Family: Not on file  . Frequency of Social Gatherings with Friends and Family: Not on file  . Attends Religious Services: Not on file  . Active Member of Clubs or Organizations: Not on file  . Attends Archivist Meetings: Not on file  . Marital Status: Not on file    ECOG Status: 1 - Symptomatic but completely ambulatory  Review of Systems  Review of Systems: A 12 point ROS discussed and pertinent positives are indicated in the HPI above.  All other systems are negative.  Physical Exam No direct physical exam was performed (except for noted visual exam findings with Video Visits).   Vital Signs: There were no vitals taken for this visit.  Imaging: MR ABDOMEN WWO CONTRAST  Result Date: 08/07/2020 CLINICAL DATA:  Hepatocellular carcinoma, follow-up EXAM: MRI ABDOMEN WITHOUT AND WITH CONTRAST TECHNIQUE: Multiplanar multisequence MR imaging of the abdomen was performed both before and after the administration of intravenous contrast. CONTRAST:  62mL GADAVIST GADOBUTROL 1 MMOL/ML IV SOLN COMPARISON:  Multiple prior studies, most recent comparison from February of 2021. FINDINGS: Lower chest: Small LEFT effusion. Limited assessment of the lung bases otherwise without abnormality. Hepatobiliary: Liver with nodular hepatic contours. Contours are similar to previous imaging. Interval development of ascites. No biliary duct distension. Cholelithiasis. Lesion in the dome of the RIGHT hemi liver previously in total measuring approximately 4.4 x 2.9 cm shows an area of approximately 5.6 x 3.0 cm on today's study but with only minimal enhancement in this location. This may also include the enhancing lesion that is seen inferiorly and  more central dominant lesion on the prior exam. This area measured approximately 2 x 1.9 cm and may be caught in the same imaging plane as the dominant lesion on today's study. There is some nodular peripheral enhancement with 2 areas, 1 of these measuring 1.5 x 1.5 cm at the periphery in the other along the medial aspect measuring 1.5 x 1.5 cm that may show some signs of washout. These are seen on image 23 of series 50. LR  TR equivocal in the setting of radioembolization warranting attention on subsequent imaging to determine whether this represents residual disease. The area of relative non enhancement is reassuring. Small lesion in hepatic subsegment VI (image 40, series 19) 1.1 cm previously approximately 2.8 cm. This shows no enhancement. LR TR nonviable. Heterogeneity at the site of the previously described lesion in hepatic subsegment II showing some low level enhancement surrounded by increased parenchymal enhancement. This follows a vascular territory and is presumably radiation affect surrounding the tumor. Presumed site of tumor measuring approximately 1.3 x 1.3 cm previously approximately 1.3 x 1.3 cm. LR TR equivocal but with features that argue for treated disease, still with low level enhancement. Attention on follow-up. Diffuse vascular territory heterogeneous enhancement is seen in the RIGHT hepatic lobe also related to radiation effect. Pancreas: Pancreas without ductal dilation or signs of inflammation. Spleen:  Spleen is enlarged 17 cm greatest craniocaudal dimension. Adrenals/Urinary Tract: Adrenal glands are normal. Renal contours are smooth and there is no sign of hydronephrosis. Stomach/Bowel: Limited assessment of the gastrointestinal tract with some thickening and edema of the ascending colon. Seen in the setting of interval development of ascites. Vascular/Lymphatic: Vascular structures in the abdomen are patent. Atheromatous plaque in the abdominal aorta without aneurysmal dilation. No  adenopathy in the retroperitoneum or upper abdomen. Other:  New small to moderate volume ascites since the prior study. Musculoskeletal: No suspicious bone lesions identified. IMPRESSION: 1. Marked response to therapy following radioembolization in the RIGHT and LEFT hepatic lobes. Given remaining heterogeneity in hepatic subsegment VII and hepatic subsegment II these areas are characterized as LR TR equivocal at this time. 2. Treated lesion in hepatic subsegment VI displays no residual enhancement. LR TR nonviable. 3. Signs of new ascites and presumed portal colopathy involving the RIGHT hemicolon. Correlate with any new abdominal symptoms. 4.  LEFT effusion. 5. Cirrhosis with splenomegaly. These results will be called to the ordering clinician or representative by the Radiologist Assistant, and communication documented in the PACS or [employer]. Electronically Signed   By: Zetta Bills M.D.   On: 08/07/2020 15:04    Labs:  CBC: Recent Labs    03/21/20 0812 04/18/20 0810 05/01/20 1016 08/03/20 1027  WBC 3.3* 4.6 5.2 3.3*  HGB 14.8 15.6 14.7 14.6  HCT 45.4 47.8 45.3 43.6  PLT 62* 61* 45* 40*    COAGS: Recent Labs    03/10/20 0810 04/18/20 0810 05/01/20 1016 08/03/20 1027  INR 1.2 1.2 1.1 1.3*    BMP: Recent Labs    03/21/20 0812 04/18/20 0810 05/01/20 1016 08/03/20 1027  NA 137 138 139 138  K 4.3 4.3 3.8 3.1*  CL 103 107 105 104  CO2 25 23 25 27   GLUCOSE 99 104* 88 95  BUN 12 15 8 7   CALCIUM 9.0 8.9 8.9 8.4*  CREATININE 0.86 0.88 0.79 0.93  GFRNONAA >60 >60 94 86  GFRAA >60 >60 109 99    LIVER FUNCTION TESTS: Recent Labs    03/10/20 0810 03/10/20 0810 03/21/20 0812 04/18/20 0810 05/01/20 1016 08/03/20 1027  BILITOT 1.2   < > 1.0 1.1 1.2 2.5*  AST 23   < > 24 33 35 22  ALT 18   < > 18 30 38 16  ALKPHOS 77  --  86 132*  --   --   PROT 7.1   < > 6.7 7.1 6.0* 5.8*  ALBUMIN 4.0  --  3.6 3.9  --   --    < > =  values in this interval not displayed.     TUMOR MARKERS: Recent Labs    01/24/20 1230 05/01/20 1016 08/03/20 1027  AFPTM 820.3* 94.7* 20.0*    Assessment and Plan:  Doing extremely well 3 months status post completion of transarterial radio embolization.  Mr. Mofield AFP has decreased from over 800 to 20 and his most recent MRI shows an excellent response to therapy.  I am extremely pleased with his result.    I do notice some progression of cirrhosis in the treated segments of the liver as well as small volume new ascites and an elevation in his bilirubin.  This suggests at least an element of underlying radiation-induced liver disease.  At this point, I think we have pushed his hepatic reserve to the limit with liver directed therapy.  We have been pursuing various liver directed therapies since June 2015.  We will continue active surveillance.  If/when he shows evidence of disease progression, systemic therapy will likely be his best option at that point.  1.)  Repeat MRI, liver labs including AFP and clinic visit in 3 months.  Electronically Signed: Jacqulynn Cadet 08/09/2020, 4:58 PM   I spent a total of 15 Minutes in remote  clinical consultation, greater than 50% of which was counseling/coordinating care for multifocal hepatocellular carcinoma.    Visit type: Audio only (telephone). Audio (no video) only due to Patient preference. Alternative for in-person consultation at [facility], [address], [place], [place]. This visit type was conducted due to national recommendations for restrictions regarding the COVID-19 Pandemic (e.g. social distancing).  This format is felt to be most appropriate for this patient at this time.  All issues noted in this document were discussed and addressed.

## 2020-09-06 ENCOUNTER — Other Ambulatory Visit: Payer: Self-pay

## 2020-09-06 ENCOUNTER — Ambulatory Visit (INDEPENDENT_AMBULATORY_CARE_PROVIDER_SITE_OTHER): Payer: Medicare Other | Admitting: Family Medicine

## 2020-09-06 ENCOUNTER — Encounter: Payer: Self-pay | Admitting: Family Medicine

## 2020-09-06 VITALS — BP 139/60 | HR 62 | Temp 97.9°F | Resp 16 | Ht 67.0 in | Wt 188.6 lb

## 2020-09-06 DIAGNOSIS — K7031 Alcoholic cirrhosis of liver with ascites: Secondary | ICD-10-CM

## 2020-09-06 DIAGNOSIS — F419 Anxiety disorder, unspecified: Secondary | ICD-10-CM

## 2020-09-06 DIAGNOSIS — R1907 Generalized intra-abdominal and pelvic swelling, mass and lump: Secondary | ICD-10-CM | POA: Diagnosis not present

## 2020-09-06 DIAGNOSIS — Z09 Encounter for follow-up examination after completed treatment for conditions other than malignant neoplasm: Secondary | ICD-10-CM

## 2020-09-06 DIAGNOSIS — I10 Essential (primary) hypertension: Secondary | ICD-10-CM

## 2020-09-06 DIAGNOSIS — C220 Liver cell carcinoma: Secondary | ICD-10-CM

## 2020-09-06 NOTE — Progress Notes (Signed)
Patient [name]   Established Patient Office Visit  Subjective:  Patient ID: Jeffrey Snow, male    DOB: 1956-10-25  Age: 64 y.o. MRN: [id number]  CC:  Chief Complaint  Patient presents with  . Follow-up    Pt states he is dying of liver cancer.    HPI [name] is a 64 year old male who presents for Follow Up today.    Patient Active Problem List   Diagnosis Date Noted  . Wheezing 11/28/2019  . Neuropathy 11/28/2019  . Anxiety 11/28/2019  . Acute upper GI bleed 09/05/2015  . Lactic acidosis 09/05/2015  . Hyperkalemia 09/05/2015  . Hemorrhagic shock ([place]) 09/05/2015  . Urinary tract infection, acute 08/31/2015  . Anemia, blood loss 12/16/2014  . Melena 12/16/2014  . Anemia 12/16/2014  . Abdominal pain, chronic, right upper quadrant 07/06/2014  . [place] (hepatocellular carcinoma) ([place]) 06/09/2014  . Other pancytopenia ([place]) 04/27/2014  . Cellulitis of leg 04/24/2014  . Elevated troponin 04/23/2014  . Normocytic anemia 04/21/2014  . Cirrhosis, alcoholic ([place]) 96/29/5284  . Hyponatremia 04/21/2014  . Hypokalemia 04/21/2014  . Metabolic acidosis 13/24/4010  . Hepatitis C 04/21/2014  . Abnormal MRI of abdomen, liver  04/21/2014  . Leg pain 04/21/2014  . Rash and nonspecific skin eruption 04/21/2014  . T wave inversion in EKG 04/21/2014  . Right foot pain 03/31/2014  . Hyperglycemia 03/03/2014  . Vitamin D deficiency disease 03/03/2014  . HTN (hypertension) 03/03/2014  . Elevated LFTs 03/03/2014  . Screening for colon cancer 03/03/2014  . Annual physical exam 03/03/2014  . PVD (peripheral vascular disease) ([place]) 12/03/2013  . PAD (peripheral artery disease) ([place]) 10/26/2013  . Arterial insufficiency ([place]) 10/26/2013  . Fall 10/26/2013  . Depression 09/22/2013  . Insomnia 09/22/2013  . Claudication of left lower extremity ([place])   . Edema 08/20/2013  . Pain in limb 08/20/2013  . Atherosclerosis of native  arteries of the extremities with intermittent claudication 08/20/2013  . Thrombocytopenia ([place]) 10/12/2012   Current Status: Since his last office visit, he continues to follow up with Oncology as needed for Hepatic Cancer, with his last treatment in 07/2020. He is currently not undergoing treatment. His anxiety is moderate today r/t his current diagnosis. He continues to repeat events leading up to his diagnosis of Liver Cancer. He denies suicidal ideations, homicidal ideations, or auditory hallucinations.He had occasional shortness. He denies visual changes, chest pain, cough, heart palpitations, and falls. He has occasional headaches and dizziness with position changes. Denies severe headaches, confusion, seizures, double vision, and blurred vision, nausea and vomiting. He denies fevers, chills, fatigue, recent infections, weight loss, and night sweats. Denies GI problems such as diarrhea, and constipation. He has no reports of blood in stools, dysuria and hematuria. He is taking all medications as prescribed. He denies pain today.   Past Medical History:  Diagnosis Date  . Abnormal MRI of abdomen, liver  04/21/2014  . Anxiety   . Cirrhosis, alcoholic ([place]) 01/22/2535  . Claudication of left lower extremity (HCC) m-1  . Depression   . [place] (hepatocellular carcinoma) ([place])   . Hepatitis C   . Hypertension   . Liver mass   . Panic attack     Past Surgical History:  Procedure Laterality Date  . ABDOMINAL AORTAGRAM N/A 12/27/2013   Procedure: ABDOMINAL Maxcine Ham;  Surgeon: Conrad Eyota, MD;  Location: [facility] CATH LAB;  Service: Cardiovascular;  Laterality: N/A;  . AORTOGRAM    .  COSMETIC SURGERY     facial reconstructive surgery  . ESOPHAGOGASTRODUODENOSCOPY N/A 12/17/2014   Procedure: ESOPHAGOGASTRODUODENOSCOPY (EGD);  Surgeon: Missy Sabins, MD;  Location: [facility] ENDOSCOPY;  Service: Endoscopy;  Laterality: N/A;  . ESOPHAGOGASTRODUODENOSCOPY N/A 09/05/2015   Procedure: ESOPHAGOGASTRODUODENOSCOPY (EGD);   Surgeon: Wonda Horner, MD;  Location: [facility] ENDOSCOPY;  Service: Endoscopy;  Laterality: N/A;  . IR ANGIOGRAM EXTREMITY RIGHT  03/21/2020  . IR ANGIOGRAM SELECTIVE EACH ADDITIONAL VESSEL  03/10/2020  . IR ANGIOGRAM SELECTIVE EACH ADDITIONAL VESSEL  03/10/2020  . IR ANGIOGRAM SELECTIVE EACH ADDITIONAL VESSEL  03/10/2020  . IR ANGIOGRAM SELECTIVE EACH ADDITIONAL VESSEL  03/10/2020  . IR ANGIOGRAM SELECTIVE EACH ADDITIONAL VESSEL  03/10/2020  . IR ANGIOGRAM SELECTIVE EACH ADDITIONAL VESSEL  03/21/2020  . IR ANGIOGRAM SELECTIVE EACH ADDITIONAL VESSEL  03/21/2020  . IR ANGIOGRAM SELECTIVE EACH ADDITIONAL VESSEL  03/21/2020  . IR ANGIOGRAM SELECTIVE EACH ADDITIONAL VESSEL  03/21/2020  . IR ANGIOGRAM SELECTIVE EACH ADDITIONAL VESSEL  03/21/2020  . IR ANGIOGRAM SELECTIVE EACH ADDITIONAL VESSEL  04/18/2020  . IR ANGIOGRAM SELECTIVE EACH ADDITIONAL VESSEL  04/18/2020  . IR ANGIOGRAM VISCERAL SELECTIVE  03/10/2020  . IR ANGIOGRAM VISCERAL SELECTIVE  03/21/2020  . IR ANGIOGRAM VISCERAL SELECTIVE  04/18/2020  . IR EMBO ARTERIAL NOT HEMORR HEMANG INC GUIDE ROADMAPPING  03/10/2020  . IR EMBO TUMOR ORGAN ISCHEMIA INFARCT INC GUIDE ROADMAPPING  03/21/2020  . IR EMBO TUMOR ORGAN ISCHEMIA INFARCT INC GUIDE ROADMAPPING  04/18/2020  . IR GENERIC HISTORICAL  01/19/2015   IR RADIOLOGIST EVAL & MGMT 01/19/2015 Jacqulynn Cadet, MD [facility] INTERV RAD  . IR GENERIC HISTORICAL  07/23/2016   IR RADIOLOGIST EVAL & MGMT 07/23/2016 Jacqulynn Cadet, MD [facility] INTERV RAD  . IR GENERIC HISTORICAL  05/30/2015   IR RADIOLOGIST EVAL & MGMT 05/30/2015 [facility] INTERV RAD  . IR GENERIC HISTORICAL  10/04/2014   IR RADIOLOGIST EVAL & MGMT 10/04/2014 Jacqulynn Cadet, MD [facility] INTERV RAD  . IR GENERIC HISTORICAL  02/11/2017   IR RADIOLOGIST EVAL & MGMT 02/11/2017 Jacqulynn Cadet, MD [facility] INTERV RAD  . IR RADIOLOGIST EVAL & MGMT  08/13/2017  . IR RADIOLOGIST EVAL & MGMT  08/11/2018  . IR RADIOLOGIST EVAL & MGMT  02/09/2019  . IR RADIOLOGIST EVAL & MGMT  02/10/2020  . IR  RADIOLOGIST EVAL & MGMT  05/11/2020  . IR RADIOLOGIST EVAL & MGMT  08/09/2020  . IR US GUIDE VASC ACCESS LEFT  03/10/2020  . IR US GUIDE VASC ACCESS LEFT  03/21/2020  . IR US GUIDE VASC ACCESS LEFT  04/18/2020  . IR US GUIDE VASC ACCESS RIGHT  03/21/2020  . LOWER EXTREMITY ANGIOGRAM Bilateral 12/27/2013   Procedure: LOWER EXTREMITY ANGIOGRAM;  Surgeon: Conrad [place], MD;  Location: [facility] CATH LAB;  Service: Cardiovascular;  Laterality: Bilateral;    Family History  Problem Relation Age of Onset  . Diabetes Father   . Diabetes Brother   . Diabetes Brother     Social History   Socioeconomic History  . Marital status: Single    Spouse name: Not on file  . Number of children: 3  . Years of education: Not on file  . Highest education level: Not on file  Occupational History    Employer: [employer]  Tobacco Use  . Smoking status: Former Smoker    Packs/day: 0.50    Quit date: 08/21/2011    Years since quitting: 9.0  . Smokeless tobacco: Never Used  Vaping Use  . Vaping Use: Never  used  Substance and Sexual Activity  . Alcohol use: Not on file    Comment: Occasional beer on the weekends  . Drug use: No    Types: Cocaine    Comment: abused cocaine in the 80's  . Sexual activity: Not Currently  Other Topics Concern  . Not on file  Social History Narrative   Single, never married.  Lives alone.  3 daughters, 16 grandchildren who are not very involved in the patient's care.  Quit smoking and heavy drinking 3.5 years ago.    Social Determinants of Health   Financial Resource Strain: High Risk  . Difficulty of Paying Living Expenses: Hard  Food Insecurity: No Food Insecurity  . Worried About Charity fundraiser in the Last Year: Never true  . Ran Out of Food in the Last Year: Never true  Transportation Needs: No Transportation Needs  . Lack of Transportation (Medical): No  . Lack of Transportation (Non-Medical): No  Physical Activity: Inactive  . Days of Exercise per Week: 0 days  .  Minutes of Exercise per Session: 0 min  Stress: Stress Concern Present  . Feeling of Stress : Very much  Social Connections:   . Frequency of Communication with Friends and Family: Not on file  . Frequency of Social Gatherings with Friends and Family: Not on file  . Attends Religious Services: Not on file  . Active Member of Clubs or Organizations: Not on file  . Attends Archivist Meetings: Not on file  . Marital Status: Not on file  Intimate Partner Violence:   . Fear of Current or Ex-Partner: Not on file  . Emotionally Abused: Not on file  . Physically Abused: Not on file  . Sexually Abused: Not on file    Outpatient Medications Prior to Visit  Medication Sig Dispense Refill  . albuterol (VENTOLIN HFA) 108 (90 Base) MCG/ACT inhaler Inhale 2 puffs into the lungs every 6 (six) hours as needed for wheezing or shortness of breath. 18 g 11  . ALPRAZolam (XANAX) 0.5 MG tablet Take 0.5 mg by mouth 2 (two) times daily.     . bisacodyl (DULCOLAX) 5 MG EC tablet Take 5 mg by mouth daily as needed for mild constipation or moderate constipation. Reported on 04/03/2016    . furosemide (LASIX) 20 MG tablet TAKE 1 TABLET BY MOUTH EVERY MORNING 90 tablet 1  . furosemide (LASIX) 20 MG tablet TAKE 1 TABLET(20 MG) BY MOUTH DAILY 90 tablet 0  . gabapentin (NEURONTIN) 300 MG capsule Take 1 capsule (300 mg total) by mouth 3 (three) times daily. 90 capsule 6  . hydrOXYzine (ATARAX/VISTARIL) 10 MG tablet Take 1 tablet (10 mg total) by mouth 3 (three) times daily as needed. 90 tablet 6  . pantoprazole (PROTONIX) 40 MG tablet TAKE 1 TABLET(40 MG) BY MOUTH TWICE DAILY BEFORE A MEAL 180 tablet 1  . spironolactone (ALDACTONE) 25 MG tablet TAKE 3 TABLETS(75 MG) BY MOUTH DAILY 90 tablet 6  . vitamin C (ASCORBIC ACID) 250 MG tablet Take 500 mg by mouth daily.     No facility-administered medications prior to visit.    Allergies  Allergen Reactions  . Folic Acid Other (See Comments)    Makes stomach  feel funny  . Oxycodone Itching and Other (See Comments)    Cause insomnia    ROS Review of Systems  Constitutional: Negative.   HENT: Negative.   Eyes: Negative.   Respiratory: Positive for shortness of breath (occasional ).  Cardiovascular: Negative.   Gastrointestinal: Positive for abdominal distention (abdominal swelling).  Endocrine: Negative.   Genitourinary: Negative.   Musculoskeletal: Positive for arthralgias (generalized).  Skin: Negative.   Allergic/Immunologic: Negative.   Neurological: Positive for dizziness (occasional ) and headaches (occasional ).  Hematological: Negative.   Psychiatric/Behavioral: Positive for agitation. The patient is nervous/anxious.    Objective:    Physical Exam Vitals and nursing note reviewed.  HENT:     Head: Normocephalic and atraumatic.     Nose: Nose normal.  Cardiovascular:     Rate and Rhythm: Normal rate and regular rhythm.     Pulses: Normal pulses.     Heart sounds: Normal heart sounds.  Pulmonary:     Effort: Pulmonary effort is normal.     Breath sounds: Normal breath sounds.  Abdominal:     General: Bowel sounds are normal. There is distension (swelling).     Palpations: Abdomen is soft.  Musculoskeletal:        General: Normal range of motion.     Cervical back: Normal range of motion and neck supple.  Skin:    General: Skin is warm and dry.  Neurological:     General: No focal deficit present.     Mental Status: He is alert and oriented to person, place, and time.  Psychiatric:        Mood and Affect: Mood normal.        Thought Content: Thought content normal.        Judgment: Judgment normal.     Comments: Anxious, agitation    BP 139/60 (BP Location: Left Arm, Patient Position: Sitting, Cuff Size: Normal)   Pulse 62   Temp 97.9 F (36.6 C)   Resp 16   Ht 5\' 7"  (1.702 m)   Wt 188 lb 9.6 oz (85.5 kg)   SpO2 97%   BMI 29.54 kg/m  Wt Readings from Last 3 Encounters:  09/06/20 188 lb 9.6 oz (85.5 kg)   04/24/20 181 lb (82.1 kg)  03/10/20 180 lb (81.6 kg)     Health Maintenance Due  Topic Date Due  . COVID-19 Vaccine (1) Never done  . COLONOSCOPY  Never done  . PNA vac Low Risk Adult (1 of 2 - PCV13) Never done  . INFLUENZA VACCINE  07/16/2020    There are no preventive care reminders to display for this patient.  Lab Results  Component Value Date   TSH 3.360 11/26/2019   Lab Results  Component Value Date   WBC 3.3 (L) 08/03/2020   HGB 14.6 08/03/2020   HCT 43.6 08/03/2020   MCV 93.4 08/03/2020   PLT 40 (L) 08/03/2020   Lab Results  Component Value Date   NA 138 08/03/2020   K 3.1 (L) 08/03/2020   CO2 27 08/03/2020   GLUCOSE 95 08/03/2020   BUN 7 08/03/2020   CREATININE 0.93 08/03/2020   BILITOT 2.5 (H) 08/03/2020   ALKPHOS 132 (H) 04/18/2020   AST 22 08/03/2020   ALT 16 08/03/2020   PROT 5.8 (L) 08/03/2020   ALBUMIN 3.9 04/18/2020   CALCIUM 8.4 (L) 08/03/2020   ANIONGAP 8 04/18/2020   Lab Results  Component Value Date   CHOL 157 11/26/2019   Lab Results  Component Value Date   HDL 46 11/26/2019   Lab Results  Component Value Date   LDLCALC 93 11/26/2019   Lab Results  Component Value Date   TRIG 100 11/26/2019   Lab Results  Component Value Date  CHOLHDL 3.4 11/26/2019   Lab Results  Component Value Date   HGBA1C 5.8 (A) 11/26/2019   Assessment & Plan:   1. [place] (hepatocellular carcinoma) ([employer])   2. Alcoholic cirrhosis of liver with ascites (HCC)  3. Abdominal swelling, generalized  4. Essential hypertension The current medical regimen is effective; blood pressure is stable at 139/60 today; continue present plan and medications as prescribed. He will continue to take medications as prescribed, to decrease high sodium intake, excessive alcohol intake, increase potassium intake, smoking cessation, and increase physical activity of at least 30 minutes of cardio activity daily. He will continue to follow Heart Healthy or DASH diet.  5.  Anxiety  6. Follow up He will follow up in 6 months.   No orders of the defined types were placed in this encounter.   No orders of the defined types were placed in this encounter.   Referral Orders  No referral(s) requested today    Kathe Becton,  MSN, FNP-BC [facility] [address] [place], [place] [address] [phone]- fax  Problem List Items Addressed This Visit      Cardiovascular and Mediastinum   HTN (hypertension)     Digestive   Cirrhosis, alcoholic ([place])   [place] (hepatocellular carcinoma) ([place]) - Primary     Other   Anxiety    Other Visit Diagnoses    Abdominal swelling, generalized       Follow up          No orders of the defined types were placed in this encounter.   Follow-up: Return in about 3 months (around 12/06/2020).    Azzie Glatter, FNP

## 2020-09-08 ENCOUNTER — Other Ambulatory Visit: Payer: Self-pay

## 2020-09-08 ENCOUNTER — Encounter: Payer: Self-pay | Admitting: Family Medicine

## 2020-09-08 DIAGNOSIS — C220 Liver cell carcinoma: Secondary | ICD-10-CM

## 2020-09-11 ENCOUNTER — Inpatient Hospital Stay: Payer: Medicare Other

## 2020-09-11 ENCOUNTER — Inpatient Hospital Stay: Payer: Medicare Other | Attending: Hematology | Admitting: Hematology

## 2020-09-19 ENCOUNTER — Emergency Department (HOSPITAL_COMMUNITY): Payer: Medicare Other

## 2020-09-19 ENCOUNTER — Inpatient Hospital Stay (HOSPITAL_COMMUNITY)
Admission: EM | Admit: 2020-09-19 | Discharge: 2020-09-23 | DRG: 564 | Disposition: A | Discharge status: Skilled Nursing Facility | Payer: Medicare Other | Attending: Internal Medicine | Admitting: Internal Medicine

## 2020-09-19 DIAGNOSIS — Z87891 Personal history of nicotine dependence: Secondary | ICD-10-CM | POA: Diagnosis not present

## 2020-09-19 DIAGNOSIS — C220 Liver cell carcinoma: Secondary | ICD-10-CM | POA: Diagnosis present

## 2020-09-19 DIAGNOSIS — F410 Panic disorder [episodic paroxysmal anxiety] without agoraphobia: Secondary | ICD-10-CM | POA: Diagnosis present

## 2020-09-19 DIAGNOSIS — R4182 Altered mental status, unspecified: Secondary | ICD-10-CM

## 2020-09-19 DIAGNOSIS — T796XXD Traumatic ischemia of muscle, subsequent encounter: Secondary | ICD-10-CM | POA: Diagnosis not present

## 2020-09-19 DIAGNOSIS — I739 Peripheral vascular disease, unspecified: Secondary | ICD-10-CM | POA: Diagnosis present

## 2020-09-19 DIAGNOSIS — I10 Essential (primary) hypertension: Secondary | ICD-10-CM | POA: Diagnosis present

## 2020-09-19 DIAGNOSIS — D689 Coagulation defect, unspecified: Secondary | ICD-10-CM | POA: Diagnosis present

## 2020-09-19 DIAGNOSIS — Z20822 Contact with and (suspected) exposure to covid-19: Secondary | ICD-10-CM | POA: Diagnosis present

## 2020-09-19 DIAGNOSIS — K7030 Alcoholic cirrhosis of liver without ascites: Secondary | ICD-10-CM | POA: Diagnosis present

## 2020-09-19 DIAGNOSIS — Y92009 Unspecified place in unspecified non-institutional (private) residence as the place of occurrence of the external cause: Secondary | ICD-10-CM

## 2020-09-19 DIAGNOSIS — W1830XA Fall on same level, unspecified, initial encounter: Secondary | ICD-10-CM | POA: Diagnosis present

## 2020-09-19 DIAGNOSIS — W19XXXA Unspecified fall, initial encounter: Secondary | ICD-10-CM | POA: Diagnosis not present

## 2020-09-19 DIAGNOSIS — M6282 Rhabdomyolysis: Secondary | ICD-10-CM | POA: Insufficient documentation

## 2020-09-19 DIAGNOSIS — Z79899 Other long term (current) drug therapy: Secondary | ICD-10-CM | POA: Diagnosis not present

## 2020-09-19 DIAGNOSIS — G928 Other toxic encephalopathy: Secondary | ICD-10-CM | POA: Diagnosis present

## 2020-09-19 DIAGNOSIS — T796XXA Traumatic ischemia of muscle, initial encounter: Secondary | ICD-10-CM | POA: Diagnosis present

## 2020-09-19 DIAGNOSIS — D696 Thrombocytopenia, unspecified: Secondary | ICD-10-CM | POA: Diagnosis not present

## 2020-09-19 DIAGNOSIS — D6959 Other secondary thrombocytopenia: Secondary | ICD-10-CM | POA: Diagnosis present

## 2020-09-19 DIAGNOSIS — Z8719 Personal history of other diseases of the digestive system: Secondary | ICD-10-CM

## 2020-09-19 DIAGNOSIS — Z23 Encounter for immunization: Secondary | ICD-10-CM

## 2020-09-19 LAB — PROTIME-INR
INR: 1.7 — ABNORMAL HIGH (ref 0.8–1.2)
Prothrombin Time: 19.4 seconds — ABNORMAL HIGH (ref 11.4–15.2)

## 2020-09-19 LAB — COMPREHENSIVE METABOLIC PANEL
ALT: 65 U/L — ABNORMAL HIGH (ref 0–44)
AST: 330 U/L — ABNORMAL HIGH (ref 15–41)
Albumin: 3.5 g/dL (ref 3.5–5.0)
Alkaline Phosphatase: 97 U/L (ref 38–126)
Anion gap: 12 (ref 5–15)
BUN: 17 mg/dL (ref 8–23)
CO2: 23 mmol/L (ref 22–32)
Calcium: 9.3 mg/dL (ref 8.9–10.3)
Chloride: 106 mmol/L (ref 98–111)
Creatinine, Ser: 1.06 mg/dL (ref 0.61–1.24)
GFR calc non Af Amer: >60 mL/min (ref >60)
Glucose, Bld: 97 mg/dL (ref 70–99)
Potassium: 4.6 mmol/L (ref 3.5–5.1)
Sodium: 141 mmol/L (ref 135–145)
Total Bilirubin: 5.2 mg/dL — ABNORMAL HIGH (ref 0.3–1.2)
Total Protein: 6.4 g/dL — ABNORMAL LOW (ref 6.5–8.1)

## 2020-09-19 LAB — RAPID URINE DRUG SCREEN, HOSP PERFORMED
Amphetamines: NOT DETECTED
Barbiturates: NOT DETECTED
Benzodiazepines: POSITIVE — AB
Cocaine: NOT DETECTED
Opiates: POSITIVE — AB
Tetrahydrocannabinol: NOT DETECTED

## 2020-09-19 LAB — CBC WITH DIFFERENTIAL/PLATELET
Abs Immature Granulocytes: 0.03 10*3/uL (ref 0.00–0.07)
Basophils Absolute: 0 10*3/uL (ref 0.0–0.1)
Basophils Relative: 0 %
Eosinophils Absolute: 0 10*3/uL (ref 0.0–0.5)
Eosinophils Relative: 0 %
HCT: 44.6 % (ref 39.0–52.0)
Hemoglobin: 14.7 g/dL (ref 13.0–17.0)
Immature Granulocytes: 0 %
Lymphocytes Relative: 8 %
Lymphs Abs: 0.5 10*3/uL — ABNORMAL LOW (ref 0.7–4.0)
MCH: 30.8 pg (ref 26.0–34.0)
MCHC: 33 g/dL (ref 30.0–36.0)
MCV: 93.3 fL (ref 80.0–100.0)
Monocytes Absolute: 0.6 10*3/uL (ref 0.1–1.0)
Monocytes Relative: 9 %
Neutro Abs: 5.5 10*3/uL (ref 1.7–7.7)
Neutrophils Relative %: 83 %
Platelets: 33 10*3/uL — ABNORMAL LOW (ref 150–400)
RBC: 4.78 MIL/uL (ref 4.22–5.81)
RDW: 14.6 % (ref 11.5–15.5)
WBC: 6.7 10*3/uL (ref 4.0–10.5)
nRBC: 0 % (ref 0.0–0.2)

## 2020-09-19 LAB — URINALYSIS, ROUTINE W REFLEX MICROSCOPIC
Bilirubin Urine: NEGATIVE
Glucose, UA: NEGATIVE mg/dL
Hgb urine dipstick: NEGATIVE
Ketones, ur: 20 mg/dL — AB
Leukocytes,Ua: NEGATIVE
Nitrite: NEGATIVE
Protein, ur: NEGATIVE mg/dL
Specific Gravity, Urine: 1.023 (ref 1.005–1.030)
pH: 6 (ref 5.0–8.0)

## 2020-09-19 LAB — T4, FREE: Free T4: 1.03 ng/dL (ref 0.61–1.12)

## 2020-09-19 LAB — CK: Total CK: 8810 U/L — ABNORMAL HIGH (ref 49–397)

## 2020-09-19 LAB — RESPIRATORY PANEL BY RT PCR (FLU A&B, COVID)
Influenza A by PCR: NEGATIVE
Influenza B by PCR: NEGATIVE
SARS Coronavirus 2 by RT PCR: NEGATIVE

## 2020-09-19 LAB — HIV ANTIBODY (ROUTINE TESTING W REFLEX): HIV Screen 4th Generation wRfx: NONREACTIVE

## 2020-09-19 LAB — AMMONIA: Ammonia: 38 umol/L — ABNORMAL HIGH (ref 9–35)

## 2020-09-19 LAB — TROPONIN I (HIGH SENSITIVITY)
Troponin I (High Sensitivity): 62 ng/L — ABNORMAL HIGH (ref <18)
Troponin I (High Sensitivity): 50 ng/L — ABNORMAL HIGH (ref <18)

## 2020-09-19 LAB — VITAMIN B12: Vitamin B-12: 796 pg/mL (ref 180–914)

## 2020-09-19 LAB — ACETAMINOPHEN LEVEL: Acetaminophen (Tylenol), Serum: <10 ug/mL — ABNORMAL LOW (ref 10–30)

## 2020-09-19 LAB — LIPASE, BLOOD: Lipase: 32 U/L (ref 11–51)

## 2020-09-19 LAB — TYPE AND SCREEN
ABO/RH(D): O POS
Antibody Screen: NEGATIVE

## 2020-09-19 LAB — TSH: TSH: 1.47 u[IU]/mL (ref 0.350–4.500)

## 2020-09-19 LAB — ETHANOL: Alcohol, Ethyl (B): <10 mg/dL (ref <10)

## 2020-09-19 LAB — D-DIMER, QUANTITATIVE: D-Dimer, Quant: 2 ug/mL-FEU — ABNORMAL HIGH (ref 0.00–0.50)

## 2020-09-19 MED ORDER — LACTULOSE 10 GM/15ML PO SOLN
10.0000 g | Freq: Two times a day (BID) | ORAL | Status: DC
  Filled 2020-09-19 (×2): qty 15

## 2020-09-19 MED ORDER — GABAPENTIN 300 MG PO CAPS: 300.0000 mg | ORAL_CAPSULE | Freq: Three times a day (TID) | ORAL | Status: DC

## 2020-09-19 MED ORDER — PANTOPRAZOLE SODIUM 40 MG IV SOLR
40.0000 mg | INTRAVENOUS | Status: DC
  Administered 2020-09-19 – 2020-09-22 (×4): 40 mg via INTRAVENOUS
  Filled 2020-09-19 (×4): qty 40

## 2020-09-19 MED ORDER — ALBUTEROL SULFATE HFA 108 (90 BASE) MCG/ACT IN AERS
2.0000 | INHALATION_SPRAY | Freq: Four times a day (QID) | RESPIRATORY_TRACT | Status: DC | PRN
  Filled 2020-09-19: qty 6.7

## 2020-09-19 MED ORDER — THIAMINE HCL 100 MG/ML IJ SOLN
100.0000 mg | Freq: Every day | INTRAMUSCULAR | Status: DC
  Administered 2020-09-19 – 2020-09-21 (×3): 100 mg via INTRAVENOUS
  Filled 2020-09-19 (×4): qty 2

## 2020-09-19 MED ORDER — SODIUM CHLORIDE 0.9 % IV BOLUS
1000.0000 mL | Freq: Once | INTRAVENOUS | Status: AC
  Administered 2020-09-19: 1000 mL via INTRAVENOUS

## 2020-09-19 MED ORDER — LACTATED RINGERS IV SOLN: INTRAVENOUS | Status: DC

## 2020-09-19 MED ORDER — SPIRONOLACTONE 12.5 MG HALF TABLET
12.5000 mg | ORAL_TABLET | Freq: Every day | ORAL | Status: DC
  Filled 2020-09-19: qty 1

## 2020-09-19 NOTE — ED Notes (Signed)
Patient transported to CT 

## 2020-09-19 NOTE — ED Provider Notes (Signed)
[place] EMERGENCY DEPARTMENT Provider Note  CSN: [id number] Arrival date & time: 09/19/20 1856    History Chief Complaint  Patient presents with  . Fall    HPI  Jeffrey Snow is a 64 y.o. male with history of hepatocellular carcinoma, alcoholic cirrhosis was brought to the ED via EMS from home where he apparently lives alone. They were called by some sort of alert system and found the patient on the floor, confused and covered in stool. Patient is unable to give any clear history of recent illness or how he fell.    Past Medical History:  Diagnosis Date  . Abnormal MRI of abdomen, liver  04/21/2014  . Anxiety   . Cirrhosis, alcoholic ([place]) 02/13/4969  . Claudication of left lower extremity (HCC) m-1  . Depression   . Amesti (hepatocellular carcinoma) ([place])   . Hepatitis C   . Hypertension   . Liver mass   . Panic attack     Past Surgical History:  Procedure Laterality Date  . ABDOMINAL AORTAGRAM N/A 12/27/2013   Procedure: ABDOMINAL Maxcine Ham;  Surgeon: Conrad Zeb, MD;  Location: [facility] CATH LAB;  Service: Cardiovascular;  Laterality: N/A;  . AORTOGRAM    . COSMETIC SURGERY     facial reconstructive surgery  . ESOPHAGOGASTRODUODENOSCOPY N/A 12/17/2014   Procedure: ESOPHAGOGASTRODUODENOSCOPY (EGD);  Surgeon: Missy Sabins, MD;  Location: [facility] ENDOSCOPY;  Service: Endoscopy;  Laterality: N/A;  . ESOPHAGOGASTRODUODENOSCOPY N/A 09/05/2015   Procedure: ESOPHAGOGASTRODUODENOSCOPY (EGD);  Surgeon: Wonda Horner, MD;  Location: [facility] ENDOSCOPY;  Service: Endoscopy;  Laterality: N/A;  . IR ANGIOGRAM EXTREMITY RIGHT  03/21/2020  . IR ANGIOGRAM SELECTIVE EACH ADDITIONAL VESSEL  03/10/2020  . IR ANGIOGRAM SELECTIVE EACH ADDITIONAL VESSEL  03/10/2020  . IR ANGIOGRAM SELECTIVE EACH ADDITIONAL VESSEL  03/10/2020  . IR ANGIOGRAM SELECTIVE EACH ADDITIONAL VESSEL  03/10/2020  . IR ANGIOGRAM SELECTIVE EACH ADDITIONAL VESSEL  03/10/2020  . IR ANGIOGRAM SELECTIVE EACH ADDITIONAL VESSEL  03/21/2020  . IR  ANGIOGRAM SELECTIVE EACH ADDITIONAL VESSEL  03/21/2020  . IR ANGIOGRAM SELECTIVE EACH ADDITIONAL VESSEL  03/21/2020  . IR ANGIOGRAM SELECTIVE EACH ADDITIONAL VESSEL  03/21/2020  . IR ANGIOGRAM SELECTIVE EACH ADDITIONAL VESSEL  03/21/2020  . IR ANGIOGRAM SELECTIVE EACH ADDITIONAL VESSEL  04/18/2020  . IR ANGIOGRAM SELECTIVE EACH ADDITIONAL VESSEL  04/18/2020  . IR ANGIOGRAM VISCERAL SELECTIVE  03/10/2020  . IR ANGIOGRAM VISCERAL SELECTIVE  03/21/2020  . IR ANGIOGRAM VISCERAL SELECTIVE  04/18/2020  . IR EMBO ARTERIAL NOT HEMORR HEMANG INC GUIDE ROADMAPPING  03/10/2020  . IR EMBO TUMOR ORGAN ISCHEMIA INFARCT INC GUIDE ROADMAPPING  03/21/2020  . IR EMBO TUMOR ORGAN ISCHEMIA INFARCT INC GUIDE ROADMAPPING  04/18/2020  . IR GENERIC HISTORICAL  01/19/2015   IR RADIOLOGIST EVAL & MGMT 01/19/2015 Jacqulynn Cadet, MD [facility] INTERV RAD  . IR GENERIC HISTORICAL  07/23/2016   IR RADIOLOGIST EVAL & MGMT 07/23/2016 Jacqulynn Cadet, MD [facility] INTERV RAD  . IR GENERIC HISTORICAL  05/30/2015   IR RADIOLOGIST EVAL & MGMT 05/30/2015 [facility] INTERV RAD  . IR GENERIC HISTORICAL  10/04/2014   IR RADIOLOGIST EVAL & MGMT 10/04/2014 Jacqulynn Cadet, MD [facility] INTERV RAD  . IR GENERIC HISTORICAL  02/11/2017   IR RADIOLOGIST EVAL & MGMT 02/11/2017 Jacqulynn Cadet, MD [facility] INTERV RAD  . IR RADIOLOGIST EVAL & MGMT  08/13/2017  . IR RADIOLOGIST EVAL & MGMT  08/11/2018  . IR RADIOLOGIST EVAL & MGMT  02/09/2019  . IR RADIOLOGIST EVAL & MGMT  02/10/2020  . IR RADIOLOGIST EVAL & MGMT  05/11/2020  . IR RADIOLOGIST EVAL & MGMT  08/09/2020  . IR US GUIDE VASC ACCESS LEFT  03/10/2020  . IR US GUIDE VASC ACCESS LEFT  03/21/2020  . IR US GUIDE VASC ACCESS LEFT  04/18/2020  . IR US GUIDE VASC ACCESS RIGHT  03/21/2020  . LOWER EXTREMITY ANGIOGRAM Bilateral 12/27/2013   Procedure: LOWER EXTREMITY ANGIOGRAM;  Surgeon: Conrad [place], MD;  Location: [facility] CATH LAB;  Service: Cardiovascular;  Laterality: Bilateral;    Family History  Problem Relation Age of Onset  .  Diabetes Father   . Diabetes Brother   . Diabetes Brother     Social History   Tobacco Use  . Smoking status: Former Smoker    Packs/day: 0.50    Quit date: 08/21/2011    Years since quitting: 9.0  . Smokeless tobacco: Never Used  Vaping Use  . Vaping Use: Never used  Substance Use Topics  . Alcohol use: Not on file    Comment: Occasional beer on the weekends  . Drug use: No    Types: Cocaine    Comment: abused cocaine in the 80's     Home Medications Prior to Admission medications   Medication Sig Start Date End Date Taking? Authorizing Provider  acetaminophen-codeine (TYLENOL #3) 300-30 MG tablet Take 1 tablet by mouth 3 (three) times daily as needed for pain. 07/21/20   [provider]  albuterol (VENTOLIN HFA) 108 (90 Base) MCG/ACT inhaler Inhale 2 puffs into the lungs every 6 (six) hours as needed for wheezing or shortness of breath. 07/04/20   Azzie Glatter, FNP  ALPRAZolam Duanne Moron) 1 MG tablet Take 1 mg by mouth in the morning and at bedtime. 08/24/20   [provider]  bisacodyl (DULCOLAX) 5 MG EC tablet Take 5 mg by mouth daily as needed for mild constipation or moderate constipation. Reported on 04/03/2016    [provider]  FLUoxetine (PROZAC) 40 MG capsule Take 40 mg by mouth daily. 07/27/20   [provider]  furosemide (LASIX) 20 MG tablet TAKE 1 TABLET BY MOUTH EVERY MORNING Patient taking differently: Take 20 mg by mouth daily.  11/26/19   Azzie Glatter, FNP  gabapentin (NEURONTIN) 300 MG capsule Take 1 capsule (300 mg total) by mouth 3 (three) times daily. 06/06/20   Azzie Glatter, FNP  hydrOXYzine (ATARAX/VISTARIL) 10 MG tablet Take 1 tablet (10 mg total) by mouth 3 (three) times daily as needed. Patient taking differently: Take 10 mg by mouth in the morning, at noon, and at bedtime.  11/26/19   Azzie Glatter, FNP  pantoprazole (PROTONIX) 40 MG tablet TAKE 1 TABLET(40 MG) BY MOUTH TWICE DAILY BEFORE A MEAL Patient  taking differently: Take 40 mg by mouth 2 (two) times daily.  11/26/19   Azzie Glatter, FNP  spironolactone (ALDACTONE) 25 MG tablet TAKE 3 TABLETS(75 MG) BY MOUTH DAILY Patient taking differently: Take 25 mg by mouth daily.  04/03/20   Azzie Glatter, FNP  vitamin C (ASCORBIC ACID) 250 MG tablet Take 500 mg by mouth daily.    [provider]     Allergies    Folic acid and Oxycodone   Review of Systems   Review of Systems Unable to assess due to mental status.    Physical Exam BP (!) 142/54   Pulse 75   Temp 98.6 F (37 C) (Rectal)   Resp 17   SpO2 96%  Physical Exam Vitals and nursing note reviewed.  Constitutional:      Appearance: Normal appearance.  HENT:     Head: Normocephalic.     Comments: Hematoma posterior scalp    Nose: Nose normal.     Mouth/Throat:     Mouth: Mucous membranes are dry.  Eyes:     Extraocular Movements: Extraocular movements intact.     Conjunctiva/sclera: Conjunctivae normal.  Neck:     Comments: In c-collar Cardiovascular:     Rate and Rhythm: Normal rate.  Pulmonary:     Effort: Pulmonary effort is normal.     Breath sounds: Normal breath sounds.  Abdominal:     General: Abdomen is flat.     Palpations: Abdomen is soft.     Tenderness: There is no abdominal tenderness.  Musculoskeletal:        General: No swelling. Normal range of motion.  Skin:    General: Skin is warm and dry.  Neurological:     General: No focal deficit present.     Mental Status: He is disoriented.     Cranial Nerves: No cranial nerve deficit.     Comments: Difficult to fully assess  Psychiatric:        Mood and Affect: Mood normal.      ED Results / Procedures / Treatments   Labs (all labs ordered are listed, but only abnormal results are displayed) Labs Reviewed  COMPREHENSIVE METABOLIC PANEL - Abnormal; Notable for the following components:      Result Value   Total Protein 6.4 (*)    AST 330 (*)    ALT 65 (*)    Total  Bilirubin 5.2 (*)    All other components within normal limits  CBC WITH DIFFERENTIAL/PLATELET - Abnormal; Notable for the following components:   Platelets 33 (*)    Lymphs Abs 0.5 (*)    All other components within normal limits  PROTIME-INR - Abnormal; Notable for the following components:   Prothrombin Time 19.4 (*)    INR 1.7 (*)    All other components within normal limits  AMMONIA - Abnormal; Notable for the following components:   Ammonia 38 (*)    All other components within normal limits  URINALYSIS, ROUTINE W REFLEX MICROSCOPIC - Abnormal; Notable for the following components:   Color, Urine AMBER (*)    Ketones, ur 20 (*)    All other components within normal limits  RAPID URINE DRUG SCREEN, HOSP PERFORMED - Abnormal; Notable for the following components:   Opiates POSITIVE (*)    Benzodiazepines POSITIVE (*)    All other components within normal limits  CK - Abnormal; Notable for the following components:   Total CK 8,810 (*)    All other components within normal limits  ACETAMINOPHEN LEVEL - Abnormal; Notable for the following components:   Acetaminophen (Tylenol), Serum <10 (*)    All other components within normal limits  TROPONIN I (HIGH SENSITIVITY) - Abnormal; Notable for the following components:   Troponin I (High Sensitivity) 62 (*)    All other components within normal limits  TROPONIN I (HIGH SENSITIVITY) - Abnormal; Notable for the following components:   Troponin I (High Sensitivity) 50 (*)    All other components within normal limits  RESPIRATORY PANEL BY RT PCR (FLU A&B, COVID)  ETHANOL  LIPASE, BLOOD  T4, FREE  TSH  OCCULT BLOOD X 1 CARD TO LAB, STOOL  VITAMIN B12  HIV ANTIBODY (ROUTINE TESTING W REFLEX)  D-DIMER,  QUANTITATIVE (NOT AT [facility])  TYPE AND SCREEN    EKG EKG Interpretation  Date/Time:  Tuesday September 19 2020 10:27:18 EDT Ventricular Rate:  63 PR Interval:    QRS Duration: 99 QT Interval:  455 QTC Calculation: 466 R  Axis:   -37 Text Interpretation: Sinus rhythm LAE, consider biatrial enlargement Left axis deviation Probable anterior infarct, age indeterminate LOW VOLTAGE Since last tracing Nonspecific T wave abnormality NOW PRESENT Confirmed by Calvert Cantor [phone]) on 09/19/2020 10:31:24 AM   Radiology CT Head Wo Contrast  Result Date: 09/19/2020 CLINICAL DATA:  Intoxication, fell striking head, neck pain, occipital hematoma EXAM: CT HEAD WITHOUT CONTRAST CT CERVICAL SPINE WITHOUT CONTRAST TECHNIQUE: Multidetector CT imaging of the head and cervical spine was performed following the standard protocol without intravenous contrast. Multiplanar CT image reconstructions of the cervical spine were also generated. COMPARISON:  None FINDINGS: CT HEAD FINDINGS Brain: Generalized atrophy. Normal ventricular morphology. No midline shift or mass effect. Small vessel chronic ischemic changes of deep cerebral white matter. No intracranial hemorrhage, mass lesion, evidence of acute infarction, or extra-axial fluid collection. Vascular: Atherosclerotic calcification of internal carotid arteries at skull base. No hyperdense vessels. Skull: Intact Sinuses/Orbits: Clear Other: N/A CT CERVICAL SPINE FINDINGS Alignment: Normal Skull base and vertebrae: Skull base intact. Incomplete posterior arch C1, developmental anomaly. Vertebral body heights maintained. Disc space narrowing and endplate spur formation at C5-C6. Schmorl's node inferior endplate C3. No fracture, subluxation or bone destruction. Soft tissues and spinal canal: Prevertebral soft tissues normal thickness. Atherosclerotic calcifications at the carotid bifurcations bilaterally as well as proximal great vessels. Disc levels:  No specific abnormalities Upper chest: Emphysematous changes at lung apices Other: N/A IMPRESSION: Atrophy with small vessel chronic ischemic changes of deep cerebral white matter. No acute intracranial abnormalities. Degenerative disc disease changes at  C5-C6. No acute cervical spine abnormalities. Electronically Signed   By: Lavonia Dana M.D.   On: 09/19/2020 09:56   CT Cervical Spine Wo Contrast  Result Date: 09/19/2020 CLINICAL DATA:  Intoxication, fell striking head, neck pain, occipital hematoma EXAM: CT HEAD WITHOUT CONTRAST CT CERVICAL SPINE WITHOUT CONTRAST TECHNIQUE: Multidetector CT imaging of the head and cervical spine was performed following the standard protocol without intravenous contrast. Multiplanar CT image reconstructions of the cervical spine were also generated. COMPARISON:  None FINDINGS: CT HEAD FINDINGS Brain: Generalized atrophy. Normal ventricular morphology. No midline shift or mass effect. Small vessel chronic ischemic changes of deep cerebral white matter. No intracranial hemorrhage, mass lesion, evidence of acute infarction, or extra-axial fluid collection. Vascular: Atherosclerotic calcification of internal carotid arteries at skull base. No hyperdense vessels. Skull: Intact Sinuses/Orbits: Clear Other: N/A CT CERVICAL SPINE FINDINGS Alignment: Normal Skull base and vertebrae: Skull base intact. Incomplete posterior arch C1, developmental anomaly. Vertebral body heights maintained. Disc space narrowing and endplate spur formation at C5-C6. Schmorl's node inferior endplate C3. No fracture, subluxation or bone destruction. Soft tissues and spinal canal: Prevertebral soft tissues normal thickness. Atherosclerotic calcifications at the carotid bifurcations bilaterally as well as proximal great vessels. Disc levels:  No specific abnormalities Upper chest: Emphysematous changes at lung apices Other: N/A IMPRESSION: Atrophy with small vessel chronic ischemic changes of deep cerebral white matter. No acute intracranial abnormalities. Degenerative disc disease changes at C5-C6. No acute cervical spine abnormalities. Electronically Signed   By: Lavonia Dana M.D.   On: 09/19/2020 09:56    Procedures Procedures  Medications Ordered in  the ED Medications  albuterol (VENTOLIN HFA) 108 (90 Base) MCG/ACT inhaler 2  puff (has no administration in time range)  pantoprazole (PROTONIX) injection 40 mg (40 mg Intravenous Given 09/19/20 1400)  lactated ringers infusion ( Intravenous New Bag/Given 09/19/20 1406)  lactulose (CHRONULAC) 10 GM/15ML solution 10 g (10 g Oral Not Given 09/19/20 1438)  thiamine (B-1) injection 100 mg (100 mg Intravenous Given 09/19/20 1403)  sodium chloride 0.9 % bolus 1,000 mL (0 mLs Intravenous Stopped 09/19/20 1347)     MDM Rules/Calculators/A&P MDM Patient with history of cirrhosis and HCC here with confusion and fall. Will check labs, imaging, EKG and continue to monitor.  ED Course  I have reviewed the triage vital signs and the nursing notes.  Pertinent labs & imaging results that were available during my care of the patient were reviewed by me and considered in my medical decision making (see chart for details).  Clinical Course as of Sep 19 1452  Tue Sep 19, 2020  0927 Ammonia not significantly elevated.    [CS]  8527 CBC is normal.    [CS]  0928 PT/INR it mildly elevated    [CS]  0938 Trop mildly elevated, lipase normal. CMP is unremarkable, although AST is still pending.    [CS]  1001 CT head/c-spine are neg    [CS]  1020 CMP completed, AST is elevated above ALT   [CS]  1026 CK is elevated, no significant renal impairment yet, will given saline bolus.    [CS]  7824 UA neg, no signs of myoglobinuria   [CS]  1111 Covid is neg.    [CS]  2353 ETOH neg. UDS positive for benzos and opiates, he has Rx for both. Still confused and not back to baseline. Will admit for further evaluation. Hospitalist paged.    [CS]  1207 Spoke with Dr. Posey Pronto, Hospitalists, who will come evaluate for admission.    [CS]    Clinical Course User Index [CS] Truddie Hidden, MD    Final Clinical Impression(s) / ED Diagnoses Final diagnoses:  Altered mental status, unspecified altered mental status type   Non-traumatic rhabdomyolysis    Rx / DC Orders ED Discharge Orders    None       Truddie Hidden, MD 09/19/20 1453

## 2020-09-19 NOTE — H&P (Addendum)
History and Physical    Jeffrey Snow FGH:[id number] DOB: 08/10/1956 DOA: 09/19/2020  PCP: Azzie Glatter, FNP    Patient coming from:  Home  Chief Complaint:  Fall    HPI: Jeffrey Snow is a 64 y.o. male with medical history significant of [place] followed by oncology clinic at [facility] seen in ER.Pt found on floor at home and ems was alerted by a emergency alert system of some sort. Pt found on floor covered with feces and unkempt. He is awake and only oriented to self and otherwise is disoriented and confused but cooperative.  HPI is limited as patient is disoriented.  Chart review shows patient had his oncology visit in 6 September of this year where he was stable with occasional shortness of breath. .  ED Course:  Blood pressure (!) 127/50, pulse 72, temperature 98.6 F (37 C), temperature source Rectal, resp. rate 14, SpO2 97 %. Pt is stable, confused , head ct is negative per ed provider.  Initial vitals are wnl on arrival pt is afebrile.  Labs reviewed and CMP shows normal sodium of 141, potassium of 4.6, chloride of 106, bicarb of 23, , Glucose of 97, creatinine of 1.06 and GFR of more than 60, calcium of 9.3, anion gap of 12. Hepatic function test shows alk phos of 97, albumin of 3.5, lipase of 32, abnormal AST of 330, abnormal ALT of 65, total protein of 6.4, total bili of 5.2, ammonia level of 38. Initial troponin of 62, followed by repeat of 50, initial CPK of 8810.  Initial CBC shows normal white count of 6.7, normal hemoglobin of 14.7, normal MCV of 93.3, normal RDW of 14.6, CBC also shows platelet count of 33,000.  Of note last month on August 03, 2020 patient had a platelet count of 40,000 and in previous CBCs patient has had thrombocytopenia in the 60s. PT of 19.4, INR of 1.7.  Serum alpha-fetoprotein tumor marker checked on 19 August was 20, from 94.7 in May of this year.   Review of Systems: As per HPI otherwise all systems reviewed and negative.  Past Medical  History:  Diagnosis Date  . Abnormal MRI of abdomen, liver  04/21/2014  . Anxiety   . Cirrhosis, alcoholic ([place]) 05/22/8937  . Claudication of left lower extremity (HCC) m-1  . Depression   . [place] (hepatocellular carcinoma) ([place])   . Hepatitis C   . Hypertension   . Liver mass   . Panic attack     Past Surgical History:  Procedure Laterality Date  . ABDOMINAL AORTAGRAM N/A 12/27/2013   Procedure: ABDOMINAL Maxcine Ham;  Surgeon: Conrad [place], MD;  Location: [facility] CATH LAB;  Service: Cardiovascular;  Laterality: N/A;  . AORTOGRAM    . COSMETIC SURGERY     facial reconstructive surgery  . ESOPHAGOGASTRODUODENOSCOPY N/A 12/17/2014   Procedure: ESOPHAGOGASTRODUODENOSCOPY (EGD);  Surgeon: Missy Sabins, MD;  Location: [facility] ENDOSCOPY;  Service: Endoscopy;  Laterality: N/A;  . ESOPHAGOGASTRODUODENOSCOPY N/A 09/05/2015   Procedure: ESOPHAGOGASTRODUODENOSCOPY (EGD);  Surgeon: Wonda Horner, MD;  Location: [facility] ENDOSCOPY;  Service: Endoscopy;  Laterality: N/A;  . IR ANGIOGRAM EXTREMITY RIGHT  03/21/2020  . IR ANGIOGRAM SELECTIVE EACH ADDITIONAL VESSEL  03/10/2020  . IR ANGIOGRAM SELECTIVE EACH ADDITIONAL VESSEL  03/10/2020  . IR ANGIOGRAM SELECTIVE EACH ADDITIONAL VESSEL  03/10/2020  . IR ANGIOGRAM SELECTIVE EACH ADDITIONAL VESSEL  03/10/2020  . IR ANGIOGRAM SELECTIVE EACH ADDITIONAL VESSEL  03/10/2020  . IR ANGIOGRAM SELECTIVE EACH ADDITIONAL VESSEL  03/21/2020  .  IR ANGIOGRAM SELECTIVE EACH ADDITIONAL VESSEL  03/21/2020  . IR ANGIOGRAM SELECTIVE EACH ADDITIONAL VESSEL  03/21/2020  . IR ANGIOGRAM SELECTIVE EACH ADDITIONAL VESSEL  03/21/2020  . IR ANGIOGRAM SELECTIVE EACH ADDITIONAL VESSEL  03/21/2020  . IR ANGIOGRAM SELECTIVE EACH ADDITIONAL VESSEL  04/18/2020  . IR ANGIOGRAM SELECTIVE EACH ADDITIONAL VESSEL  04/18/2020  . IR ANGIOGRAM VISCERAL SELECTIVE  03/10/2020  . IR ANGIOGRAM VISCERAL SELECTIVE  03/21/2020  . IR ANGIOGRAM VISCERAL SELECTIVE  04/18/2020  . IR EMBO ARTERIAL NOT HEMORR HEMANG INC GUIDE ROADMAPPING  03/10/2020    . IR EMBO TUMOR ORGAN ISCHEMIA INFARCT INC GUIDE ROADMAPPING  03/21/2020  . IR EMBO TUMOR ORGAN ISCHEMIA INFARCT INC GUIDE ROADMAPPING  04/18/2020  . IR GENERIC HISTORICAL  01/19/2015   IR RADIOLOGIST EVAL & MGMT 01/19/2015 Jacqulynn Cadet, MD [facility] INTERV RAD  . IR GENERIC HISTORICAL  07/23/2016   IR RADIOLOGIST EVAL & MGMT 07/23/2016 Jacqulynn Cadet, MD [facility] INTERV RAD  . IR GENERIC HISTORICAL  05/30/2015   IR RADIOLOGIST EVAL & MGMT 05/30/2015 [facility] INTERV RAD  . IR GENERIC HISTORICAL  10/04/2014   IR RADIOLOGIST EVAL & MGMT 10/04/2014 Jacqulynn Cadet, MD [facility] INTERV RAD  . IR GENERIC HISTORICAL  02/11/2017   IR RADIOLOGIST EVAL & MGMT 02/11/2017 Jacqulynn Cadet, MD [facility] INTERV RAD  . IR RADIOLOGIST EVAL & MGMT  08/13/2017  . IR RADIOLOGIST EVAL & MGMT  08/11/2018  . IR RADIOLOGIST EVAL & MGMT  02/09/2019  . IR RADIOLOGIST EVAL & MGMT  02/10/2020  . IR RADIOLOGIST EVAL & MGMT  05/11/2020  . IR RADIOLOGIST EVAL & MGMT  08/09/2020  . IR US GUIDE VASC ACCESS LEFT  03/10/2020  . IR US GUIDE VASC ACCESS LEFT  03/21/2020  . IR US GUIDE VASC ACCESS LEFT  04/18/2020  . IR US GUIDE VASC ACCESS RIGHT  03/21/2020  . LOWER EXTREMITY ANGIOGRAM Bilateral 12/27/2013   Procedure: LOWER EXTREMITY ANGIOGRAM;  Surgeon: Conrad [place], MD;  Location: [facility] CATH LAB;  Service: Cardiovascular;  Laterality: Bilateral;     reports that he quit smoking about 9 years ago. He smoked 0.50 packs per day. He has never used smokeless tobacco. He reports that he does not use drugs. No history on file for alcohol use.  Allergies  Allergen Reactions  . Folic Acid Other (See Comments)    Makes stomach feel funny  . Oxycodone Itching and Other (See Comments)    Cause insomnia    Family History  Problem Relation Age of Onset  . Diabetes Father   . Diabetes Brother   . Diabetes Brother     Prior to Admission medications   Medication Sig Start Date End Date Taking? Authorizing Provider  albuterol (VENTOLIN HFA) 108 (90 Base)  MCG/ACT inhaler Inhale 2 puffs into the lungs every 6 (six) hours as needed for wheezing or shortness of breath. 07/04/20   Azzie Glatter, FNP  ALPRAZolam Duanne Moron) 0.5 MG tablet Take 0.5 mg by mouth 2 (two) times daily.     [provider]  bisacodyl (DULCOLAX) 5 MG EC tablet Take 5 mg by mouth daily as needed for mild constipation or moderate constipation. Reported on 04/03/2016    [provider]  furosemide (LASIX) 20 MG tablet TAKE 1 TABLET BY MOUTH EVERY MORNING 11/26/19   Azzie Glatter, FNP  furosemide (LASIX) 20 MG tablet TAKE 1 TABLET(20 MG) BY MOUTH DAILY 06/26/20   Azzie Glatter, FNP  gabapentin (NEURONTIN) 300 MG capsule Take 1  capsule (300 mg total) by mouth 3 (three) times daily. 06/06/20   Azzie Glatter, FNP  hydrOXYzine (ATARAX/VISTARIL) 10 MG tablet Take 1 tablet (10 mg total) by mouth 3 (three) times daily as needed. 11/26/19   Azzie Glatter, FNP  pantoprazole (PROTONIX) 40 MG tablet TAKE 1 TABLET(40 MG) BY MOUTH TWICE DAILY BEFORE A MEAL 11/26/19   Azzie Glatter, FNP  spironolactone (ALDACTONE) 25 MG tablet TAKE 3 TABLETS(75 MG) BY MOUTH DAILY 04/03/20   Azzie Glatter, FNP  vitamin C (ASCORBIC ACID) 250 MG tablet Take 500 mg by mouth daily.    [provider]    Physical Exam: Vitals:   09/19/20 0811 09/19/20 0812 09/19/20 0830 09/19/20 0915  BP:  129/66 (!) 136/48 (!) 127/50  Pulse:  73 68 72  Resp:  '19 14 14  ' Temp:  98.6 F (37 C)    TempSrc:  Rectal    SpO2: 97% 97% 99% 97%    Constitutional: NAD, calm, comfortable Vitals:   09/19/20 0811 09/19/20 0812 09/19/20 0830 09/19/20 0915  BP:  129/66 (!) 136/48 (!) 127/50  Pulse:  73 68 72  Resp:  '19 14 14  ' Temp:  98.6 F (37 C)    TempSrc:  Rectal    SpO2: 97% 97% 99% 97%   Eyes: PERRL, EOMI lids and conjunctivae normal ENMT: Mucous membranes are moist. Posterior pharynx clear of any exudate or lesions.Normal dentition.  Neck: normal, supple, no masses, no  thyromegaly, no carotid bruit  Respiratory: clear to auscultation bilaterally, no wheezing, no crackles. Normal respiratory effort. No accessory muscle use.  Cardiovascular: Regular rate and rhythm, no murmurs / rubs / gallops. No extremity edema. 1+ pedal pulses. No carotid bruits.  Abdomen: no tenderness, no masses palpated. No hepatosplenomegaly. Bowel sounds positive.  Musculoskeletal: no clubbing / cyanosis. No joint deformity upper and lower extremities. Pt moving all four ext , no contractures. Normal muscle tone.  Skin: no rashes, pt has bruises along left side of thigh p[robably from fall but no  lesions, ulcers. No induration Neurologic: CN 2-12 grossly intact. Sensation intact,pt moving all ext.  Psychiatric: Cooperative and disoriented.   Labs on Admission: I have personally reviewed following labs and imaging studies  CBC: Recent Labs  Lab 09/19/20 0847  WBC 6.7  NEUTROABS 5.5  HGB 14.7  HCT 44.6  MCV 93.3  PLT 33*   Basic Metabolic Panel: Recent Labs  Lab 09/19/20 0847  NA 141  K 4.6  CL 106  CO2 23  GLUCOSE 97  BUN 17  CREATININE 1.06  CALCIUM 9.3   GFR: Estimated Creatinine Clearance: 72.6 mL/min (by C-G formula based on SCr of 1.06 mg/dL). Liver Function Tests: Recent Labs  Lab 09/19/20 0847  AST 330*  ALT 65*  ALKPHOS 97  BILITOT 5.2*  PROT 6.4*  ALBUMIN 3.5   Recent Labs  Lab 09/19/20 0847  LIPASE 32   Recent Labs  Lab 09/19/20 0845  AMMONIA 38*   Coagulation Profile: Recent Labs  Lab 09/19/20 0847  INR 1.7*   Cardiac Enzymes: Recent Labs  Lab 09/19/20 0847  CKTOTAL 8,810*   BNP (last 3 results) No results for input(s): PROBNP in the last 8760 hours. HbA1C: No results for input(s): HGBA1C in the last 72 hours. CBG: No results for input(s): GLUCAP in the last 168 hours. Lipid Profile: No results for input(s): CHOL, HDL, LDLCALC, TRIG, CHOLHDL, LDLDIRECT in the last 72 hours. Thyroid Function Tests: No results for  input(s):  TSH, T4TOTAL, FREET4, T3FREE, THYROIDAB in the last 72 hours. Anemia Panel: No results for input(s): VITAMINB12, FOLATE, FERRITIN, TIBC, IRON, RETICCTPCT in the last 72 hours. Urine analysis:    Component Value Date/Time   COLORURINE AMBER (A) 09/19/2020 1031   APPEARANCEUR CLEAR 09/19/2020 1031   LABSPEC 1.023 09/19/2020 1031   PHURINE 6.0 09/19/2020 1031   GLUCOSEU NEGATIVE 09/19/2020 1031   HGBUR NEGATIVE 09/19/2020 1031   BILIRUBINUR NEGATIVE 09/19/2020 1031   BILIRUBINUR Negative 11/26/2019 1134   KETONESUR 20 (A) 09/19/2020 1031   PROTEINUR NEGATIVE 09/19/2020 1031   UROBILINOGEN 0.2 11/26/2019 1134   UROBILINOGEN 0.2 12/17/2017 1129   NITRITE NEGATIVE 09/19/2020 1031   LEUKOCYTESUR NEGATIVE 09/19/2020 1031   No intake or output data in the 24 hours ending 09/19/20 1307 Lab Results  Component Value Date   CREATININE 1.06 09/19/2020   CREATININE 0.93 08/03/2020   CREATININE 0.79 05/01/2020    COVID-19 Labs  No results for input(s): DDIMER, FERRITIN, LDH, CRP in the last 72 hours.  Lab Results  Component Value Date   [place] NEGATIVE 09/19/2020    Radiological Exams on Admission: CT Head Wo Contrast  Result Date: 09/19/2020 CLINICAL DATA:  Intoxication, fell striking head, neck pain, occipital hematoma EXAM: CT HEAD WITHOUT CONTRAST CT CERVICAL SPINE WITHOUT CONTRAST TECHNIQUE: Multidetector CT imaging of the head and cervical spine was performed following the standard protocol without intravenous contrast. Multiplanar CT image reconstructions of the cervical spine were also generated. COMPARISON:  None FINDINGS: CT HEAD FINDINGS Brain: Generalized atrophy. Normal ventricular morphology. No midline shift or mass effect. Small vessel chronic ischemic changes of deep cerebral white matter. No intracranial hemorrhage, mass lesion, evidence of acute infarction, or extra-axial fluid collection. Vascular: Atherosclerotic calcification of internal carotid arteries  at skull base. No hyperdense vessels. Skull: Intact Sinuses/Orbits: Clear Other: N/A CT CERVICAL SPINE FINDINGS Alignment: Normal Skull base and vertebrae: Skull base intact. Incomplete posterior arch C1, developmental anomaly. Vertebral body heights maintained. Disc space narrowing and endplate spur formation at C5-C6. Schmorl's node inferior endplate C3. No fracture, subluxation or bone destruction. Soft tissues and spinal canal: Prevertebral soft tissues normal thickness. Atherosclerotic calcifications at the carotid bifurcations bilaterally as well as proximal great vessels. Disc levels:  No specific abnormalities Upper chest: Emphysematous changes at lung apices Other: N/A  IMPRESSION: Atrophy with small vessel chronic ischemic changes of deep cerebral white matter. No acute intracranial abnormalities. Degenerative disc disease changes at C5-C6. No acute cervical spine abnormalities. Electronically Signed   By: Lavonia Dana M.D.   On: 09/19/2020 09:56   CT Cervical Spine Wo Contrast  Result Date: 09/19/2020 CLINICAL DATA:  Intoxication, fell striking head, neck pain, occipital hematoma EXAM: CT HEAD WITHOUT CONTRAST CT CERVICAL SPINE WITHOUT CONTRAST TECHNIQUE: Multidetector CT imaging of the head and cervical spine was performed following the standard protocol without intravenous contrast. Multiplanar CT image reconstructions of the cervical spine were also generated. COMPARISON:  None FINDINGS: CT HEAD FINDINGS Brain: Generalized atrophy. Normal ventricular morphology. No midline shift or mass effect. Small vessel chronic ischemic changes of deep cerebral white matter. No intracranial hemorrhage, mass lesion, evidence of acute infarction, or extra-axial fluid collection. Vascular: Atherosclerotic calcification of internal carotid arteries at skull base. No hyperdense vessels. Skull: Intact Sinuses/Orbits: Clear Other: N/A CT CERVICAL SPINE FINDINGS Alignment: Normal Skull base and vertebrae: Skull base  intact. Incomplete posterior arch C1, developmental anomaly. Vertebral body heights maintained. Disc space narrowing and endplate spur formation at C5-C6. Schmorl's node inferior endplate C3.  No fracture, subluxation or bone destruction. Soft tissues and spinal canal: Prevertebral soft tissues normal thickness. Atherosclerotic calcifications at the carotid bifurcations bilaterally as well as proximal great vessels. Disc levels:  No specific abnormalities Upper chest: Emphysematous changes at lung apices Other: N/A  IMPRESSION: Atrophy with small vessel chronic ischemic changes of deep cerebral white matter. No acute intracranial abnormalities. Degenerative disc disease changes at C5-C6. No acute cervical spine abnormalities. Electronically Signed   By: Lavonia Dana M.D.   On: 09/19/2020 09:56    EKG: Independently reviewed.  sinus rhythm 63.  Assessment/Plan Principal Problem:   Fall Active Problems:   Traumatic rhabdomyolysis ([place])   Thrombocytopenia ([place])   History of GI bleed   PAD (peripheral artery disease) (HCC)   HTN (hypertension)   HCC (hepatocellular carcinoma) ([place])   Fall/traumatic rhabdo: Initial CPK of 8810, start patient on IV fluids with strict I's and O's. Continue to monitor renal function consider bicarb if creatinine becomes elevated or serum bicarb is below 20. Fall precautions, and ambulation and out of bed with assistance only. Attribute fall to confusion from mildly elevated ammonia level. PT consult.  ? balance issues due to b12 def or vit d def, Will get d-dimer and if high will obtain cta chest ,risk of vte/dvt is high due to h/o cancer and alcohol history and pt being immobile on floor for  Unknown duration.    Thrombocytopenia: Attribute to underlying cirrhosis , will cut down aldactone and consider low dose propranolol ,after monitoring HR and if is above 80's consistently.  Will hold off on heparin or Lovenox for DVT prophylaxis and , SCD's.   H/O  GIB: IV PPI . type and screen.  PAD: LE pulses 1+ BL , D?D include due to PAD vs dehydration.  Will get ABI's  HTN; Aldactone at 12,5 mg po qday. Lasix held currently , hold aldactone if pt needs lasix.    HCC:per oncology.  Monitor LFT;s.  Abnormal LFT's: Attribute to etoh liver dz and HCC and we will follow. IV thiamine.   DVT prophylaxis:  Heparin.  Code Status:  Full code.  Family Communication:  Listed patient contacts: Charles Hailu-[phone]. Erika Jordan-daughter-[phone]. No family at bedside. No family at home patient lives alone.  Disposition Plan:  To be determined.  Consults called:  None  Admission status: Status is: Inpatient  Remains inpatient appropriate because:Inpatient level of care appropriate due to severity of illness   Dispo: The patient is from: Home              Anticipated d/c is to: TBD              Anticipated d/c date is: 3 days              Patient currently is not medically stable to d/c.          Para Skeans MD [facility] Pager [phone] If 7PM-7AM, please contact night-coverage [email or web address] Password [email or web address] 09/19/2020, 1:07 PM

## 2020-09-19 NOTE — ED Notes (Signed)
Sitter at bedside.

## 2020-09-19 NOTE — ED Triage Notes (Signed)
PT BIB [facility] from home after pt was found down for unknown length of time. EMS reports pt AMS with hematoma on back of head. Pt placed in C-color. Pt was found on scene covered in fecal matter.   Temp 98.6  Rectal B/P 129/66 RR 16 98% 02 RA HR 73

## 2020-09-19 NOTE — ED Notes (Signed)
Attempt to call report to the floor. Charge made aware of refusal.

## 2020-09-19 NOTE — Progress Notes (Signed)
Pt could not wake up enough to be mobilized, and will retry at another time.   09/19/20 1600  PT Visit Information  Last PT Received On 09/19/20  Reason Eval/Treat Not Completed Other (comment)   Mee Hives, PT MS Acute Rehab Dept. Number: [place] and [place]

## 2020-09-20 ENCOUNTER — Encounter (HOSPITAL_COMMUNITY): Payer: Self-pay | Admitting: Internal Medicine

## 2020-09-20 ENCOUNTER — Other Ambulatory Visit: Payer: Self-pay

## 2020-09-20 DIAGNOSIS — T796XXD Traumatic ischemia of muscle, subsequent encounter: Secondary | ICD-10-CM

## 2020-09-20 DIAGNOSIS — C220 Liver cell carcinoma: Secondary | ICD-10-CM

## 2020-09-20 DIAGNOSIS — D696 Thrombocytopenia, unspecified: Secondary | ICD-10-CM

## 2020-09-20 DIAGNOSIS — R4182 Altered mental status, unspecified: Secondary | ICD-10-CM

## 2020-09-20 LAB — COMPREHENSIVE METABOLIC PANEL
ALT: 47 U/L — ABNORMAL HIGH (ref 0–44)
AST: 177 U/L — ABNORMAL HIGH (ref 15–41)
Albumin: 2.6 g/dL — ABNORMAL LOW (ref 3.5–5.0)
Alkaline Phosphatase: 75 U/L (ref 38–126)
Anion gap: 9 (ref 5–15)
BUN: 17 mg/dL (ref 8–23)
CO2: 21 mmol/L — ABNORMAL LOW (ref 22–32)
Calcium: 8.5 mg/dL — ABNORMAL LOW (ref 8.9–10.3)
Chloride: 111 mmol/L (ref 98–111)
Creatinine, Ser: 0.96 mg/dL (ref 0.61–1.24)
GFR calc non Af Amer: >60 mL/min (ref >60)
Glucose, Bld: 76 mg/dL (ref 70–99)
Potassium: 3.9 mmol/L (ref 3.5–5.1)
Sodium: 141 mmol/L (ref 135–145)
Total Bilirubin: 3.8 mg/dL — ABNORMAL HIGH (ref 0.3–1.2)
Total Protein: 5.1 g/dL — ABNORMAL LOW (ref 6.5–8.1)

## 2020-09-20 LAB — PROTIME-INR
INR: 1.8 — ABNORMAL HIGH (ref 0.8–1.2)
Prothrombin Time: 20.3 seconds — ABNORMAL HIGH (ref 11.4–15.2)

## 2020-09-20 LAB — CBC
HCT: 39 % (ref 39.0–52.0)
Hemoglobin: 12.7 g/dL — ABNORMAL LOW (ref 13.0–17.0)
MCH: 31 pg (ref 26.0–34.0)
MCHC: 32.6 g/dL (ref 30.0–36.0)
MCV: 95.1 fL (ref 80.0–100.0)
Platelets: 26 10*3/uL — CL (ref 150–400)
RBC: 4.1 MIL/uL — ABNORMAL LOW (ref 4.22–5.81)
RDW: 14.8 % (ref 11.5–15.5)
WBC: 4 10*3/uL (ref 4.0–10.5)
nRBC: 0 % (ref 0.0–0.2)

## 2020-09-20 LAB — MAGNESIUM: Magnesium: 1.8 mg/dL (ref 1.7–2.4)

## 2020-09-20 LAB — OCCULT BLOOD X 1 CARD TO LAB, STOOL: Fecal Occult Bld: POSITIVE — AB

## 2020-09-20 LAB — PHOSPHORUS: Phosphorus: 3.5 mg/dL (ref 2.5–4.6)

## 2020-09-20 MED ORDER — ACETAMINOPHEN 500 MG PO TABS
500.0000 mg | ORAL_TABLET | Freq: Three times a day (TID) | ORAL | Status: DC | PRN
  Administered 2020-09-22: 500 mg via ORAL
  Filled 2020-09-20 (×2): qty 1

## 2020-09-20 MED ORDER — FLUOXETINE HCL 20 MG PO CAPS
40.0000 mg | ORAL_CAPSULE | Freq: Every day | ORAL | Status: DC
  Administered 2020-09-20 – 2020-09-23 (×4): 40 mg via ORAL
  Filled 2020-09-20 (×4): qty 2

## 2020-09-20 MED ORDER — OXYCODONE HCL 5 MG PO TABS
5.0000 mg | ORAL_TABLET | Freq: Four times a day (QID) | ORAL | Status: DC | PRN
  Administered 2020-09-20 – 2020-09-22 (×3): 5 mg via ORAL
  Filled 2020-09-20 (×4): qty 1

## 2020-09-20 MED ORDER — CLONIDINE HCL 0.1 MG PO TABS
0.1000 mg | ORAL_TABLET | Freq: Two times a day (BID) | ORAL | Status: DC
  Administered 2020-09-20 – 2020-09-23 (×6): 0.1 mg via ORAL
  Filled 2020-09-20 (×6): qty 1

## 2020-09-20 MED ORDER — INFLUENZA VAC A&B SA ADJ QUAD 0.5 ML IM PRSY
0.5000 mL | PREFILLED_SYRINGE | INTRAMUSCULAR | Status: AC
  Administered 2020-09-22: 0.5 mL via INTRAMUSCULAR
  Filled 2020-09-20: qty 0.5

## 2020-09-20 NOTE — Evaluation (Signed)
Clinical/Bedside Swallow Evaluation Patient Details  Name: JAKIM DRAPEAU MRN: [id number] Date of Birth: [birth year]  Today's Date: 09/20/2020 Time: SLP Start Time (ACUTE ONLY): 38 SLP Stop Time (ACUTE ONLY): 7510 SLP Time Calculation (min) (ACUTE ONLY): 22 min  Past Medical History:  Past Medical History:  Diagnosis Date   Abnormal MRI of abdomen, liver  04/21/2014   Anxiety    Cirrhosis, alcoholic ([place]) 01/20/8526   Claudication of left lower extremity ([place]) m-1   Depression    HCC (hepatocellular carcinoma) (HCC)    Hepatitis C    Hypertension    Liver mass    Panic attack    Past Surgical History:  Past Surgical History:  Procedure Laterality Date   ABDOMINAL AORTAGRAM N/A 12/27/2013   Procedure: ABDOMINAL Maxcine Ham;  Surgeon: Conrad Williford, MD;  Location: [facility] CATH LAB;  Service: Cardiovascular;  Laterality: N/A;   AORTOGRAM     COSMETIC SURGERY     facial reconstructive surgery   ESOPHAGOGASTRODUODENOSCOPY N/A 12/17/2014   Procedure: ESOPHAGOGASTRODUODENOSCOPY (EGD);  Surgeon: Missy Sabins, MD;  Location: [facility] ENDOSCOPY;  Service: Endoscopy;  Laterality: N/A;   ESOPHAGOGASTRODUODENOSCOPY N/A 09/05/2015   Procedure: ESOPHAGOGASTRODUODENOSCOPY (EGD);  Surgeon: Wonda Horner, MD;  Location: [facility];  Service: Endoscopy;  Laterality: N/A;   IR ANGIOGRAM EXTREMITY RIGHT  03/21/2020   IR ANGIOGRAM SELECTIVE EACH ADDITIONAL VESSEL  03/10/2020   IR ANGIOGRAM SELECTIVE EACH ADDITIONAL VESSEL  03/10/2020   IR ANGIOGRAM SELECTIVE EACH ADDITIONAL VESSEL  03/10/2020   IR ANGIOGRAM SELECTIVE EACH ADDITIONAL VESSEL  03/10/2020   IR ANGIOGRAM SELECTIVE EACH ADDITIONAL VESSEL  03/10/2020   IR ANGIOGRAM SELECTIVE EACH ADDITIONAL VESSEL  03/21/2020   IR ANGIOGRAM SELECTIVE EACH ADDITIONAL VESSEL  03/21/2020   IR ANGIOGRAM SELECTIVE EACH ADDITIONAL VESSEL  03/21/2020   IR ANGIOGRAM SELECTIVE EACH ADDITIONAL VESSEL  03/21/2020   IR ANGIOGRAM SELECTIVE EACH ADDITIONAL VESSEL  03/21/2020   IR ANGIOGRAM  SELECTIVE EACH ADDITIONAL VESSEL  04/18/2020   IR ANGIOGRAM SELECTIVE EACH ADDITIONAL VESSEL  04/18/2020   IR ANGIOGRAM VISCERAL SELECTIVE  03/10/2020   IR ANGIOGRAM VISCERAL SELECTIVE  03/21/2020   IR ANGIOGRAM VISCERAL SELECTIVE  04/18/2020   IR EMBO ARTERIAL NOT HEMORR HEMANG INC GUIDE ROADMAPPING  03/10/2020   IR EMBO TUMOR ORGAN ISCHEMIA INFARCT INC GUIDE ROADMAPPING  03/21/2020   IR EMBO TUMOR ORGAN ISCHEMIA INFARCT INC GUIDE ROADMAPPING  04/18/2020   IR GENERIC HISTORICAL  01/19/2015   IR RADIOLOGIST EVAL & MGMT 01/19/2015 Jacqulynn Cadet, MD [facility] INTERV RAD   IR GENERIC HISTORICAL  07/23/2016   IR RADIOLOGIST EVAL & MGMT 07/23/2016 Jacqulynn Cadet, MD [facility] INTERV RAD   IR GENERIC HISTORICAL  05/30/2015   IR RADIOLOGIST EVAL & MGMT 05/30/2015 [facility] INTER[facility] RAD   IR GENERIC HISTORICAL  10/04/2014   IR RADIOLOGIST EVAL & MGMT 10/04/2014 Jacqulynn Cadet, MD [facility] INTERV RAD   IR GENERIC HISTORICAL  02/11/2017   IR RADIOLOGIST EVAL & MGMT 02/11/2017 Jacqulynn Cadet, MD [facility] INTERV RAD   IR RADIOLOGIST EVAL & MGMT  08/13/2017   IR RADIOLOGIST EVAL & MGMT  08/11/2018   IR RADIOLOGIST EVAL & MGMT  02/09/2019   IR RADIOLOGIST EVAL & MGMT  02/10/2020   IR RADIOLOGIST EVAL & MGMT  05/11/2020   IR RADIOLOGIST EVAL & MGMT  08/09/2020   IR US GUIDE VASC ACCESS LEFT  03/10/2020   IR US GUIDE VASC ACCESS LEFT  03/21/2020   IR US GUIDE VASC ACCESS LEFT  04/18/2020   IR  US GUIDE VASC ACCESS RIGHT  03/21/2020   LOWER EXTREMITY ANGIOGRAM Bilateral 12/27/2013   Procedure: LOWER EXTREMITY ANGIOGRAM;  Surgeon: Conrad Gilbert, MD;  Location: [facility] CATH LAB;  Service: Cardiovascular;  Laterality: Bilateral;   HPI:  ISAMAR WELLBROCK is a 64 y.o. male with medical history significant of [place] admitted after fall at home. HPI limited due to pt being disoriented. Pt presents with fall/traumatic rhabdo. CT head showed atrophy with small vessel chronic ischemic changes of deep cerebral white matter. No acute intracranial abnormalities.  Degenerative disc disease changes at C5-C6. No acute cervical spine abnormalities.    Assessment / Plan / Recommendation Clinical Impression  Pt was seen for a BSE and was confused and agitated. He presents with altered mental status, frequently stating that he needs to leave and needs to call the police. SLP unable to complete oral mech exam due to pt's altered mental status, but observed that pt is edentulous. He required max verbal cues and redirection from the SLP in order to attend to his meal. Cueing was largely unsuccessful as he only accepted three bites of french toast. He consumed several oz of ginger ale and demonstrated frequent belching while drinking. Pt also demonstrated delayed coughing at end of meal. Recommend dys 3 diet (mech soft) and thin liquid. Pt will need assistance from staff to be encouraged to participate in meals. SLP will sign off for now, but there is potential for f/u if his diet tolerance worsens.  SLP Visit Diagnosis: Dysphagia, unspecified (R13.10)    Aspiration Risk  Mild aspiration risk    Diet Recommendation Dysphagia 3 (Mech soft);Thin liquid   Liquid Administration via: Cup;Straw Medication Administration: Whole meds with liquid Supervision: Intermittent supervision to cue for compensatory strategies;Staff to assist with self feeding Compensations: Slow rate;Small sips/bites Postural Changes: Seated upright at 90 degrees    Other  Recommendations Oral Care Recommendations: Oral care BID   Follow up Recommendations        Frequency and Duration            Prognosis        Swallow Study   General HPI: HERSHAL ERIKSSON is a 64 y.o. male with medical history significant of [place] admitted after fall at home. HPI limited due to pt being disoriented. Pt presents with fall/traumatic rhabdo. CT head showed atrophy with small vessel chronic ischemic changes of deep cerebral white matter. No acute intracranial abnormalities. Degenerative disc disease  changes at C5-C6. No acute cervical spine abnormalities.  Type of Study: Bedside Swallow Evaluation Diet Prior to this Study: Regular;Thin liquids Temperature Spikes Noted: No Respiratory Status: Room air History of Recent Intubation: No Behavior/Cognition: Confused;Agitated;Lethargic/Drowsy;Requires cueing;Doesn't follow directions Oral Care Completed by SLP: No Oral Cavity - Dentition: Edentulous Vision: Functional for self-feeding Self-Feeding Abilities: Needs assist;Able to feed self Patient Positioning: Upright in bed Baseline Vocal Quality: Normal    Oral/Motor/Sensory Function Overall Oral Motor/Sensory Function:  (Unable to complete)   Ice Chips Ice chips: Not tested   Thin Liquid Thin Liquid: Impaired Presentation: Straw;Self Fed Pharyngeal  Phase Impairments: Cough - Delayed    Nectar Thick Nectar Thick Liquid: Not tested   Honey Thick Honey Thick Liquid: Not tested   Puree Puree: Not tested   Solid     Solid: Within functional limits      Greggory Keen 09/20/2020,11:14 AM

## 2020-09-20 NOTE — Plan of Care (Signed)

## 2020-09-20 NOTE — Evaluation (Signed)
Physical Therapy Evaluation Patient Details Name: Jeffrey Snow MRN: [id number] DOB: 1956/12/12 Today's Date: 09/20/2020   History of Present Illness  Pt is a 64 y.o. M with significant PMH of [place] who presents after being found on the floor at home. Admitted with traumatic rhabdo, thrombocytopenia. CT head showing no acute abnormalities.   Clinical Impression  Prior to admission, pt lives alone and was presumably independent. Pt presents with decreased functional mobility secondary to poor cognition, balance deficits, weakness, and decreased coordination. Pt requiring min assist for basic stand pivot transfers using a walker; demonstrates tremulousness and decreased activity tolerance. Pt oriented to self only and requires max cueing/repetition for attending to task, initiation and execution of motor skill. Presents as a high fall risk based on these deficits. Recommending SNF.     Follow Up Recommendations SNF    Equipment Recommendations  3in1 (PT);Wheelchair (measurements PT);Wheelchair cushion (measurements PT)    Recommendations for Other Services OT consult     Precautions / Restrictions Precautions Precautions: Fall Restrictions Weight Bearing Restrictions: No      Mobility  Bed Mobility Overal bed mobility: Needs Assistance Bed Mobility: Supine to Sit     Supine to sit: Min assist     General bed mobility comments: MinA to pull trunk up to sitting position. Increased time to progress to edge of bed  Transfers Overall transfer level: Needs assistance Equipment used: Rolling walker (2 wheeled) Transfers: Sit to/from [facility] Sit to Stand: Min assist Stand pivot transfers: Min assist       General transfer comment: MinA to rise and stand and initially steady and pivot towards chair. Pt with poor proximity to walker, max cues for sequencing/direction  Ambulation/Gait                Stairs            Wheelchair Mobility     Modified Rankin (Stroke Patients Only)       Balance Overall balance assessment: Needs assistance Sitting-balance support: Feet supported Sitting balance-Leahy Scale: Fair     Standing balance support: Bilateral upper extremity supported Standing balance-Leahy Scale: Poor                               Pertinent Vitals/Pain Pain Assessment: No/denies pain    Home Living Family/patient expects to be discharged to:: Private residence Living Arrangements: Alone             Home Equipment: Environmental consultant - 2 wheels Additional Comments: Pt could not provide    Prior Function Level of Independence: Independent               Hand Dominance        Extremity/Trunk Assessment   Upper Extremity Assessment Upper Extremity Assessment: Generalized weakness    Lower Extremity Assessment Lower Extremity Assessment: Generalized weakness       Communication   Communication: No difficulties  Cognition Arousal/Alertness: Awake/alert Behavior During Therapy: Flat affect Overall Cognitive Status: Impaired/Different from baseline Area of Impairment: Orientation;Attention;Memory;Following commands;Safety/judgement;Awareness;Problem solving                 Orientation Level: Disoriented to;Time;Place;Situation Current Attention Level: Focused Memory: Decreased short-term memory Following Commands: Follows one step commands inconsistently Safety/Judgement: Decreased awareness of safety;Decreased awareness of deficits Awareness: Intellectual Problem Solving: Difficulty sequencing;Requires verbal cues General Comments: Pt oriented to self only, requires max cues and repetition to attend, initiate and execute tasks.  Often having conversations not related to topic and asking when he can go home.       General Comments      Exercises     Assessment/Plan    PT Assessment Patient needs continued PT services  PT Problem List Decreased strength;Decreased  activity tolerance;Decreased balance;Decreased mobility;Decreased coordination;Decreased cognition;Decreased safety awareness       PT Treatment Interventions DME instruction;Gait training;Stair training;Therapeutic activities;Functional mobility training;Therapeutic exercise;Balance training;Patient/family education    PT Goals (Current goals can be found in the Care Plan section)  Acute Rehab PT Goals Patient Stated Goal: go home PT Goal Formulation: With patient Time For Goal Achievement: 10/04/20 Potential to Achieve Goals: Fair    Frequency Min 2X/week   Barriers to discharge Decreased caregiver support      Co-evaluation               AM-PAC PT "6 Clicks" Mobility  Outcome Measure Help needed turning from your back to your side while in a flat bed without using bedrails?: None Help needed moving from lying on your back to sitting on the side of a flat bed without using bedrails?: A Little Help needed moving to and from a bed to a chair (including a wheelchair)?: A Little Help needed standing up from a chair using your arms (e.g., wheelchair or bedside chair)?: A Little Help needed to walk in hospital room?: A Lot Help needed climbing 3-5 steps with a railing? : A Lot 6 Click Score: 17    End of Session Equipment Utilized During Treatment: Gait belt Activity Tolerance: Patient tolerated treatment well Patient left: in chair;with call bell/phone within reach;with chair alarm set Nurse Communication: Mobility status PT Visit Diagnosis: Unsteadiness on feet (R26.81);History of falling (Z91.81);Muscle weakness (generalized) (M62.81);Difficulty in walking, not elsewhere classified (R26.2)    Time: 7482-7078 PT Time Calculation (min) (ACUTE ONLY): 17 min   Charges:   PT Evaluation $PT Eval Moderate Complexity: 1 Mod          Wyona Almas, PT, DPT Acute Rehabilitation Services Pager [phone] Office [phone]   Deno Etienne 09/20/2020, 12:51  PM

## 2020-09-20 NOTE — Plan of Care (Signed)
  Problem: Clinical Measurements: Goal: Ability to maintain clinical measurements within normal limits will improve Outcome: Progressing Goal: Will remain free from infection Outcome: Progressing   Problem: Education: Goal: Knowledge of General Education information will improve Description: Including pain rating scale, medication(s)/side effects and non-pharmacologic comfort measures Outcome: Not Progressing   Problem: Health Behavior/Discharge Planning: Goal: Ability to manage health-related needs will improve Outcome: Not Progressing

## 2020-09-20 NOTE — Progress Notes (Signed)
Jeffrey Snow  GYB:[id number] DOB: 03-20-1956 DOA: 09/19/2020 PCP: Azzie Glatter, FNP    Brief Narrative:  64 year old with a history of alcoholic cirrhosis and hepatocellular carcinoma followed at [facility] who was found on the floor at his home by EMS when his home emergency alert system summoned them.  He was unkempt and covered in feces when found.  In the ED he was oriented only to self but was cooperative.  His ammonia was not significantly elevated and an alcohol level was negative.  He was found to be in rhabdomyolysis with a CK of >8000.   Significant Events:  10/5 admit via ED  Antimicrobials:  None  DVT prophylaxis: Subcutaneous heparin  Subjective: Vital signs are stable with saturation 95% on room air.  The patient is afebrile.  The patient is awake at the time of my visit but remains confused.  He is not entirely clear where he is or how he ended up here.  He keeps mentioning it that he wishes to go home but clearly is confused and not able to make reliable decisions at this time.  His ROS is negative but again his history is not felt to be reliable at this time.  He is able to answer most simple directed questions but his speech is rambling and at times nonsensical.  Assessment & Plan:  Found down Circumstances not clear as patient lives alone - consider possible seizure, though pt has no hx of same - differential also includes accidental OD of prescribed opiates/benzos  Traumatic rhabdomyolysis Cont to hydrate - follow CK trend - renal function stable at this time - this suggests he was probably down for a prolonged period of time   Recent Labs  Lab 09/19/20 0847  CKTOTAL 8,810*     Altered mental status - toxic metabolic encephalopathy A76 is not low -TSH normal -UDS positive for opiates and benzos, both of which the patient has prescriptions for - urinalysis not remarkable - ethanol negative - ammonia essentially normal - CT head and cervical  spine unrevealing  Alcoholic cirrhosis of the liver  Thrombocytopenia Due to cirrhosis -chronic - follow - no spontaneous bleeding at this time   Hepatocellular carcinoma Followed at [facility] - CT head unremarkable at admission   Peripheral arterial disease Stable at this time   HTN Blood pressure stable presently  Code Status: FULL CODE Family Communication: No family present at time of exam Status is: Inpatient  Remains inpatient appropriate because:Inpatient level of care appropriate due to severity of illness   Dispo: The patient is from: Home              Anticipated d/c is to: SNF              Anticipated d/c date is: 3 days              Patient currently is not medically stable to d/c.  Consultants:  none  Objective: Blood pressure (!) 137/56, pulse (!) 55, temperature 98.6 F (37 C), temperature source Oral, resp. rate 18, SpO2 95 %.  Intake/Output Summary (Last 24 hours) at 09/20/2020 0858 Last data filed at 09/20/2020 0500 Gross per 24 hour  Intake 2130.24 ml  Output --  Net 2130.24 ml   There were no vitals filed for this visit.  Examination: General: No acute respiratory distress Lungs: Clear to auscultation bilaterally without wheezes or crackles Cardiovascular: Regular rate and rhythm without murmur gallop or rub normal S1 and  S2 Abdomen: Nontender, nondistended, soft, bowel sounds positive, no rebound, no ascites, no appreciable mass Extremities: No significant cyanosis, clubbing, or edema bilateral lower extremities  CBC: Recent Labs  Lab 09/19/20 0847 09/20/20 0446  WBC 6.7 4.0  NEUTROABS 5.5  --   HGB 14.7 12.7*  HCT 44.6 39.0  MCV 93.3 95.1  PLT 33* 26*   Basic Metabolic Panel: Recent Labs  Lab 09/19/20 0847 09/20/20 0446  NA 141 141  K 4.6 3.9  CL 106 111  CO2 23 21*  GLUCOSE 97 76  BUN 17 17  CREATININE 1.06 0.96  CALCIUM 9.3 8.5*  MG  --  1.8  PHOS  --  3.5   GFR: Estimated Creatinine Clearance: 80.2 mL/min  (by C-G formula based on SCr of 0.96 mg/dL).  Liver Function Tests: Recent Labs  Lab 09/19/20 0847 09/20/20 0446  AST 330* 177*  ALT 65* 47*  ALKPHOS 97 75  BILITOT 5.2* 3.8*  PROT 6.4* 5.1*  ALBUMIN 3.5 2.6*   Recent Labs  Lab 09/19/20 0847  LIPASE 32   Recent Labs  Lab 09/19/20 0845  AMMONIA 38*    Coagulation Profile: Recent Labs  Lab 09/19/20 0847 09/20/20 0446  INR 1.7* 1.8*    Cardiac Enzymes: Recent Labs  Lab 09/19/20 0847  CKTOTAL 8,810*    HbA1C: Hemoglobin A1C  Date/Time Value Ref Range Status  11/26/2019 11:29 AM 5.8 (A) 4.0 - 5.6 % Final  01/06/2019 10:56 AM 5.3 4.0 - 5.6 % Final   Hgb A1c MFr Bld  Date/Time Value Ref Range Status  06/01/2015 11:45 AM 5.6 <5.7 % Final    Comment:                                                                           According to the ADA Clinical Practice Recommendations for 2011, when HbA1c is used as a screening test:     >=6.5%   Diagnostic of Diabetes Mellitus            (if abnormal result is confirmed)   5.7-6.4%   Increased risk of developing Diabetes Mellitus   References:Diagnosis and Classification of Diabetes Mellitus,Diabetes KDXI,3382,50(NLZJQ 1):S62-S69 and Standards of Medical Care in         Diabetes - 2011,Diabetes BHAL,9379,02 (Suppl 1):S11-S61.     01/26/2015 08:34 AM 5.0 <5.7 % Final    Comment:                                                                           According to the ADA Clinical Practice Recommendations for 2011, when HbA1c is used as a screening test:     >=6.5%   Diagnostic of Diabetes Mellitus            (if abnormal result is confirmed)   5.7-6.4%   Increased risk of developing Diabetes Mellitus   References:Diagnosis and Classification of Diabetes Mellitus,Diabetes IOXB,3532,99(MEQAS 1):S62-S69 and Standards of Medical Care  in         Diabetes - 2011,Diabetes Care,2011,34 (Suppl 1):S11-S61.       CBG: No results for input(s): GLUCAP in the last  168 hours.  Recent Results (from the past 240 hour(s))  Respiratory Panel by RT PCR (Flu A&B, Covid) - Nasopharyngeal Swab     Status: None   Collection Time: 09/19/20 10:17 AM   Specimen: Nasopharyngeal Swab  Result Value Ref Range Status   SARS Coronavirus 2 by RT PCR NEGATIVE NEGATIVE Final    Comment: (NOTE) SARS-CoV-2 target nucleic acids are NOT DETECTED.  The SARS-CoV-2 RNA is generally detectable in upper respiratoy specimens during the acute phase of infection. The lowest concentration of SARS-CoV-2 viral copies this assay can detect is 131 copies/mL. A negative result does not preclude SARS-Cov-2 infection and should not be used as the sole basis for treatment or other patient management decisions. A negative result may occur with  improper specimen collection/handling, submission of specimen other than nasopharyngeal swab, presence of viral mutation(s) within the areas targeted by this assay, and inadequate number of viral copies (<131 copies/mL). A negative result must be combined with clinical observations, patient history, and epidemiological information. The expected result is Negative.  Fact Sheet for Patients:  PinkCheek.be  Fact Sheet for Healthcare Providers:  GravelBags.it  This test is no t yet approved or cleared by the [place] FDA and  has been authorized for detection and/or diagnosis of SARS-CoV-2 by FDA under an Emergency Use Authorization (EUA). This EUA will remain  in effect (meaning this test can be used) for the duration of the COVID-19 declaration under Section 564(b)(1) of the Act, 21 U.S.C. section 360bbb-3(b)(1), unless the authorization is terminated or revoked sooner.     Influenza A by PCR NEGATIVE NEGATIVE Final   Influenza B by PCR NEGATIVE NEGATIVE Final    Comment: (NOTE) The Xpert Xpress SARS-CoV-2/FLU/RSV assay is intended as an aid in  the diagnosis of influenza from  Nasopharyngeal swab specimens and  should not be used as a sole basis for treatment. Nasal washings and  aspirates are unacceptable for Xpert Xpress SARS-CoV-2/FLU/RSV  testing.  Fact Sheet for Patients: PinkCheek.be  Fact Sheet for Healthcare Providers: GravelBags.it  This test is not yet approved or cleared by the [place] FDA and  has been authorized for detection and/or diagnosis of SARS-CoV-2 by  FDA under an Emergency Use Authorization (EUA). This EUA will remain  in effect (meaning this test can be used) for the duration of the  Covid-19 declaration under Section 564(b)(1) of the Act, 21  U.S.C. section 360bbb-3(b)(1), unless the authorization is  terminated or revoked. Performed at [facility], [place] [address] [place],  [address]      Scheduled Meds: . [START ON 09/21/2020] influenza vaccine adjuvanted  0.5 mL Intramuscular Tomorrow-1000  . lactulose  10 g Oral BID  . pantoprazole (PROTONIX) IV  40 mg Intravenous Q24H  . thiamine injection  100 mg Intravenous Daily   Continuous Infusions: . lactated ringers Stopped (09/20/20 0337)     LOS: 1 day   Cherene Altes, MD [facility] Office  [phone] Pager - Text Page per Shea Evans  If 7PM-7AM, please contact night-coverage per Amion 09/20/2020, 8:58 AM

## 2020-09-20 NOTE — Progress Notes (Signed)
RE: Jeffrey Snow Date of Birth: Oct 06, 1956 Date: 09/20/2020  Please be advised that the above-named patient will require a short-term nursing home stay - anticipated 30 days or less for rehabilitation and strengthening.  The plan is for return home.

## 2020-09-21 LAB — CBC
HCT: 38.2 % — ABNORMAL LOW (ref 39.0–52.0)
Hemoglobin: 12.7 g/dL — ABNORMAL LOW (ref 13.0–17.0)
MCH: 31.6 pg (ref 26.0–34.0)
MCHC: 33.2 g/dL (ref 30.0–36.0)
MCV: 95 fL (ref 80.0–100.0)
Platelets: 25 10*3/uL — CL (ref 150–400)
RBC: 4.02 MIL/uL — ABNORMAL LOW (ref 4.22–5.81)
RDW: 15.1 % (ref 11.5–15.5)
WBC: 2.9 10*3/uL — ABNORMAL LOW (ref 4.0–10.5)
nRBC: 0 % (ref 0.0–0.2)

## 2020-09-21 LAB — MAGNESIUM: Magnesium: 1.6 mg/dL — ABNORMAL LOW (ref 1.7–2.4)

## 2020-09-21 LAB — COMPREHENSIVE METABOLIC PANEL
ALT: 49 U/L — ABNORMAL HIGH (ref 0–44)
AST: 116 U/L — ABNORMAL HIGH (ref 15–41)
Albumin: 2.6 g/dL — ABNORMAL LOW (ref 3.5–5.0)
Alkaline Phosphatase: 72 U/L (ref 38–126)
Anion gap: 9 (ref 5–15)
BUN: 14 mg/dL (ref 8–23)
CO2: 22 mmol/L (ref 22–32)
Calcium: 8.2 mg/dL — ABNORMAL LOW (ref 8.9–10.3)
Chloride: 110 mmol/L (ref 98–111)
Creatinine, Ser: 0.86 mg/dL (ref 0.61–1.24)
GFR calc non Af Amer: >60 mL/min (ref >60)
Glucose, Bld: 77 mg/dL (ref 70–99)
Potassium: 3.7 mmol/L (ref 3.5–5.1)
Sodium: 141 mmol/L (ref 135–145)
Total Bilirubin: 3.5 mg/dL — ABNORMAL HIGH (ref 0.3–1.2)
Total Protein: 4.8 g/dL — ABNORMAL LOW (ref 6.5–8.1)

## 2020-09-21 LAB — CK: Total CK: 775 U/L — ABNORMAL HIGH (ref 49–397)

## 2020-09-21 LAB — RPR: RPR Ser Ql: NONREACTIVE

## 2020-09-21 MED ORDER — MAGNESIUM SULFATE 4 GM/100ML IV SOLN
4.0000 g | Freq: Once | INTRAVENOUS | Status: AC
  Administered 2020-09-21: 4 g via INTRAVENOUS
  Filled 2020-09-21: qty 100

## 2020-09-21 NOTE — Evaluation (Signed)
Occupational Therapy Evaluation Patient Details Name: Jeffrey Snow MRN: [id number] DOB: 1956/11/15 Today's Date: 09/21/2020    History of Present Illness Pt is a 64 y.o. M with significant PMH of [place] who presents after being found on the floor at home. Admitted with traumatic rhabdo, thrombocytopenia. CT head showing no acute abnormalities.    Clinical Impression   PTA, pt reports living alone and Independence in all daily tasks without AD. Pt reports still driving. Pt presents now with diagnoses above and deficits in strength, cognition, endurance, standing balance, and generalized pain/soreness from fall at home. Pt oriented to self only, perseverating on calling brother throughout session. Pt requires Min A for transfers using RW and assistance to maneuver DME while maintaining balance. Pt requires Min A for UB ADLs and Mod A for LB ADLs. Based on current functional abilities, pt unsafe to return home alone at this time. Recommend SNF for short term rehab prior to return home.     Follow Up Recommendations  SNF;Supervision/Assistance - 24 hour    Equipment Recommendations  3 in 1 bedside commode;Tub/shower seat    Recommendations for Other Services       Precautions / Restrictions Precautions Precautions: Fall Restrictions Weight Bearing Restrictions: No      Mobility Bed Mobility Overal bed mobility: Needs Assistance Bed Mobility: Supine to Sit     Supine to sit: Supervision;HOB elevated     General bed mobility comments: Supervision with increased time/effort  Transfers Overall transfer level: Needs assistance Equipment used: Rolling walker (2 wheeled) Transfers: Sit to/from [facility] Sit to Stand: Min assist Stand pivot transfers: Min assist       General transfer comment: Min A for steadying after power up, Min A for maintaining balance and minor assistance to advance RW. Cues to stay close to walker with pt implementing after education      Balance Overall balance assessment: Needs assistance;History of Falls Sitting-balance support: Feet supported Sitting balance-Leahy Scale: Fair     Standing balance support: Bilateral upper extremity supported Standing balance-Leahy Scale: Poor Standing balance comment: reliant on UE support                           ADL either performed or assessed with clinical judgement   ADL Overall ADL's : Needs assistance/impaired Eating/Feeding: Set up;Sitting Eating/Feeding Details (indicate cue type and reason): Setup for lunch, assistance to open packaging  Grooming: Minimal assistance;Standing Grooming Details (indicate cue type and reason): Min A seated to brush hair, wash face and flatten hair. Pt reporting soreness at back of head Upper Body Bathing: Minimal assistance;Sitting   Lower Body Bathing: Moderate assistance;Sit to/from stand   Upper Body Dressing : Minimal assistance;Sitting   Lower Body Dressing: Moderate assistance;Sit to/from stand;Sitting/lateral leans   Toilet Transfer: Minimal assistance;Stand-pivot;BSC;RW Toilet Transfer Details (indicate cue type and reason): simulated to recliner Toileting- Clothing Manipulation and Hygiene: Moderate assistance;Sit to/from stand;Sitting/lateral lean       Functional mobility during ADLs: Minimal assistance;Cueing for sequencing;Cueing for safety;Rolling walker General ADL Comments: Pt with deficits in strength, cognition, endurance, standing balance, and intermittent pain/soreness from fall     Vision Patient Visual Report: Other (comment) (to be assessed) Vision Assessment?: No apparent visual deficits     Perception     Praxis      Pertinent Vitals/Pain Pain Assessment: No/denies pain     Hand Dominance Right   Extremity/Trunk Assessment Upper Extremity Assessment Upper Extremity Assessment: Generalized  weakness   Lower Extremity Assessment Lower Extremity Assessment: Defer to PT evaluation    Cervical / Trunk Assessment Cervical / Trunk Assessment: Normal   Communication Communication Communication: HOH   Cognition Arousal/Alertness: Awake/alert Behavior During Therapy: Flat affect Overall Cognitive Status: Impaired/Different from baseline Area of Impairment: Orientation;Attention;Memory;Following commands;Safety/judgement;Awareness;Problem solving                 Orientation Level: Place;Time;Situation Current Attention Level: Focused Memory: Decreased short-term memory Following Commands: Follows one step commands with increased time Safety/Judgement: Decreased awareness of safety;Decreased awareness of deficits Awareness: Intellectual Problem Solving: Difficulty sequencing;Requires verbal cues General Comments: Pt oriented to self only, perseverating on request for therapist to call brother because he is unsure where is he, why he is hospitalized, etc. Pt able to be redirected but then quickly back on topic of calling brother. At end of session, OT called brother and left pt on phone talking to him   General Comments  Telesitter    Exercises     Shoulder Instructions      Home Living Family/patient expects to be discharged to:: Private residence Living Arrangements: Alone   Type of Home: Apartment Home Access: Level entry     [place]: One level     Bathroom Shower/Tub: Occupational psychologist: Standard     Home Equipment: Environmental consultant - 2 wheels;Grab bars - toilet;Grab bars - tub/shower (has safety call bell in bathroom)          Prior Functioning/Environment Level of Independence: Independent        Comments: Pt reports Independence with ADLs, IADLs, mobility without AD. Pt reports still driivng, would drive to [employer] to pick up food         OT Problem List: Decreased strength;Impaired balance (sitting and/or standing);Decreased activity tolerance;Decreased cognition;Decreased safety awareness;Decreased knowledge of use of  DME or AE      OT Treatment/Interventions: Therapeutic exercise;Self-care/ADL training;Energy conservation;DME and/or AE instruction;Therapeutic activities;Patient/family education    OT Goals(Current goals can be found in the care plan section) Acute Rehab OT Goals Patient Stated Goal: go home OT Goal Formulation: With patient Time For Goal Achievement: 10/05/20 Potential to Achieve Goals: Good ADL Goals Pt Will Perform Grooming: with supervision;standing Pt Will Perform Lower Body Bathing: with supervision;sitting/lateral leans;sit to/from stand Pt Will Perform Lower Body Dressing: with supervision;sit to/from stand;sitting/lateral leans Pt Will Transfer to Toilet: with supervision;ambulating;regular height toilet Pt Will Perform Toileting - Clothing Manipulation and hygiene: with supervision;sit to/from stand;sitting/lateral leans Pt/caregiver will Perform Home Exercise Program: Increased strength;Both right and left upper extremity;With theraband;With Supervision;With written HEP provided Additional ADL Goal #1: Pt to verbalize at least 2 fall prevention strategies  OT Frequency: Min 3X/week   Barriers to D/C:            Co-evaluation              AM-PAC OT "6 Clicks" Daily Activity     Outcome Measure Help from another person eating meals?: A Little Help from another person taking care of personal grooming?: A Little Help from another person toileting, which includes using toliet, bedpan, or urinal?: A Lot Help from another person bathing (including washing, rinsing, drying)?: A Lot Help from another person to put on and taking off regular upper body clothing?: A Little Help from another person to put on and taking off regular lower body clothing?: A Lot 6 Click Score: 15   End of Session Equipment Utilized During Treatment: Gait belt;Rolling walker Nurse  Communication: Mobility status  Activity Tolerance: Patient tolerated treatment well Patient left: in chair;with  call bell/phone within reach;with chair alarm set;Other (comment) (telesitter)  OT Visit Diagnosis: Unsteadiness on feet (R26.81);Other abnormalities of gait and mobility (R26.89);Muscle weakness (generalized) (M62.81);History of falling (Z91.81);Other symptoms and signs involving cognitive function                Time: 1331-1356 OT Time Calculation (min): 25 min Charges:  OT General Charges $OT Visit: 1 Visit OT Evaluation $OT Eval Moderate Complexity: 1 Mod OT Treatments $Self Care/Home Management : 8-22 mins  Layla Maw, OTR/L  Layla Maw 09/21/2020, 2:11 PM

## 2020-09-21 NOTE — Progress Notes (Signed)
PROGRESS NOTE    Jeffrey Snow  PYP:[id number] DOB: 11/28/1956 DOA: 09/19/2020 PCP: Azzie Glatter, FNP   Brief Narrative: 64 year old with a history of alcoholic cirrhosis and hepatocellular carcinoma followed at [facility] who was found on the floor at his home by EMS when his home emergency alert system summoned them.  He was unkempt and covered in feces when found.  In the ED he was oriented only to self but was cooperative.  His ammonia was not significantly elevated and an alcohol level was negative.  He was found to be in rhabdomyolysis with a CK of >8000.   Assessment & Plan:   Principal Problem:   Fall Active Problems:   Thrombocytopenia ([place])   PAD (peripheral artery disease) (HCC)   HTN (hypertension)   HCC (hepatocellular carcinoma) (HCC)   Traumatic rhabdomyolysis (HCC)   History of GI bleed  #1 status post traumatic rhabdomyolysis CPK still elevated in the 700s but improved from 8000 on IV fluids.  #2 altered mental status-patient was found on the floor and unkept situation.  He lives alone.  Unclear what caused him to pass out.  Unclear if he really did pass out. No further episodes during the hospital stay. Patient lives alone and cannot return home very unsteady gait. Seen by physical therapy recommending SNF.  Case manager consulted. Work-up included CT head and CT of the cervical spine with no acute changes, UA no evidence of infection.  Urine drug screen was positive for benzos and opiates which are prescribed to him.  Normal TSH.  B12 normal.  Ammonia 38. Patient at high fall risk he requires repetition and maximum cueing to initiate a task.  #3 history of alcoholic cirrhosis of the liver/hepatocellular carcinoma followed by oncology as outpatient  #4 coagulopathy/chronic thrombocytopenia stable no evidence of active bleeding.  Platelet count 25.  INR 1.8. #5 history of essential hypertension blood pressure 138/59  #6 hypomagnesemia 1 magnesium  1.6 repleted potassium 3.7.  Estimated body mass index is 28.36 kg/m as calculated from the following:   Height as of this encounter: 5\' 8"  (1.727 m).   Weight as of this encounter: 84.6 kg.  DVT prophylaxis: None due to severe thrombocytopenia  code Status: Full code Family Communication: None at bedside Disposition Plan:  Status is: Inpatient  Dispo: The patient is from: Home              Anticipated d/c is to: SNF              Anticipated d/c date is: 1 day              Patient currently is not medically stable to d/c.  Patient is admitted with acute change in mental status with AKI and rhabdomyolysis on IV fluids.   Consultants:   None  Procedures: None Antimicrobials: None  Subjective: Patient resting in bed he was asleep when I walked into the room When I woke him up he started speaking still confused, staff reports he has very unsteady gait  Objective: Vitals:   09/20/20 1440 09/20/20 1925 09/20/20 2352 09/21/20 0359  BP: 133/62 (!) 128/57  (!) 138/59  Pulse:  60  (!) 55  Resp:  16  17  Temp: 98.2 F (36.8 C) 98.3 F (36.8 C)  98.5 F (36.9 C)  TempSrc: Oral Oral  Oral  SpO2: 95% 95%  94%  Weight:   84.6 kg   Height:   5\' 8"  (1.727 m)  Intake/Output Summary (Last 24 hours) at 09/21/2020 1127 Last data filed at 09/21/2020 0700 Gross per 24 hour  Intake 1480.13 ml  Output 700 ml  Net 780.13 ml   Filed Weights   09/20/20 2352  Weight: 84.6 kg    Examination:  General exam: Appears calm and comfortable  Respiratory system: Clear to auscultation. Respiratory effort normal. Cardiovascular system: S1 & S2 heard, RRR. No JVD, murmurs, rubs, gallops or clicks. No pedal edema. Gastrointestinal system: Abdomen is nondistended, soft and nontender. No organomegaly or masses felt. Normal bowel sounds heard. Central nervous system: Confused  extremities: Symmetric 5 x 5 power. Skin: No rashes, lesions or ulcers Psychiatry unable to assess.     Data  Reviewed: I have personally reviewed following labs and imaging studies  CBC: Recent Labs  Lab 09/19/20 0847 09/20/20 0446 09/21/20 0239  WBC 6.7 4.0 2.9*  NEUTROABS 5.5  --   --   HGB 14.7 12.7* 12.7*  HCT 44.6 39.0 38.2*  MCV 93.3 95.1 95.0  PLT 33* 26* 25*   Basic Metabolic Panel: Recent Labs  Lab 09/19/20 0847 09/20/20 0446 09/21/20 0239  NA 141 141 141  K 4.6 3.9 3.7  CL 106 111 110  CO2 23 21* 22  GLUCOSE 97 76 77  BUN 17 17 14   CREATININE 1.06 0.96 0.86  CALCIUM 9.3 8.5* 8.2*  MG  --  1.8 1.6*  PHOS  --  3.5  --    GFR: Estimated Creatinine Clearance: 90.7 mL/min (by C-G formula based on SCr of 0.86 mg/dL). Liver Function Tests: Recent Labs  Lab 09/19/20 0847 09/20/20 0446 09/21/20 0239  AST 330* 177* 116*  ALT 65* 47* 49*  ALKPHOS 97 75 72  BILITOT 5.2* 3.8* 3.5*  PROT 6.4* 5.1* 4.8*  ALBUMIN 3.5 2.6* 2.6*   Recent Labs  Lab 09/19/20 0847  LIPASE 32   Recent Labs  Lab 09/19/20 0845  AMMONIA 38*   Coagulation Profile: Recent Labs  Lab 09/19/20 0847 09/20/20 0446  INR 1.7* 1.8*   Cardiac Enzymes: Recent Labs  Lab 09/19/20 0847 09/21/20 0239  CKTOTAL 8,810* 775*   BNP (last 3 results) No results for input(s): PROBNP in the last 8760 hours. HbA1C: No results for input(s): HGBA1C in the last 72 hours. CBG: No results for input(s): GLUCAP in the last 168 hours. Lipid Profile: No results for input(s): CHOL, HDL, LDLCALC, TRIG, CHOLHDL, LDLDIRECT in the last 72 hours. Thyroid Function Tests: Recent Labs    09/19/20 1430  TSH 1.470  FREET4 1.03   Anemia Panel: Recent Labs    09/19/20 1430  VITAMINB12 796   Sepsis Labs: No results for input(s): PROCALCITON, LATICACIDVEN in the last 168 hours.  Recent Results (from the past 240 hour(s))  Respiratory Panel by RT PCR (Flu A&B, Covid) - Nasopharyngeal Swab     Status: None   Collection Time: 09/19/20 10:17 AM   Specimen: Nasopharyngeal Swab  Result Value Ref Range Status    SARS Coronavirus 2 by RT PCR NEGATIVE NEGATIVE Final    Comment: (NOTE) SARS-CoV-2 target nucleic acids are NOT DETECTED.  The SARS-CoV-2 RNA is generally detectable in upper respiratoy specimens during the acute phase of infection. The lowest concentration of SARS-CoV-2 viral copies this assay can detect is 131 copies/mL. A negative result does not preclude SARS-Cov-2 infection and should not be used as the sole basis for treatment or other patient management decisions. A negative result may occur with  improper specimen collection/handling, submission of specimen  other than nasopharyngeal swab, presence of viral mutation(s) within the areas targeted by this assay, and inadequate number of viral copies (<131 copies/mL). A negative result must be combined with clinical observations, patient history, and epidemiological information. The expected result is Negative.  Fact Sheet for Patients:  PinkCheek.be  Fact Sheet for Healthcare Providers:  GravelBags.it  This test is no t yet approved or cleared by the [place] FDA and  has been authorized for detection and/or diagnosis of SARS-CoV-2 by FDA under an Emergency Use Authorization (EUA). This EUA will remain  in effect (meaning this test can be used) for the duration of the COVID-19 declaration under Section 564(b)(1) of the Act, 21 U.S.C. section 360bbb-3(b)(1), unless the authorization is terminated or revoked sooner.     Influenza A by PCR NEGATIVE NEGATIVE Final   Influenza B by PCR NEGATIVE NEGATIVE Final    Comment: (NOTE) The Xpert Xpress SARS-CoV-2/FLU/RSV assay is intended as an aid in  the diagnosis of influenza from Nasopharyngeal swab specimens and  should not be used as a sole basis for treatment. Nasal washings and  aspirates are unacceptable for Xpert Xpress SARS-CoV-2/FLU/RSV  testing.  Fact Sheet for  Patients: PinkCheek.be  Fact Sheet for Healthcare Providers: GravelBags.it  This test is not yet approved or cleared by the [place] FDA and  has been authorized for detection and/or diagnosis of SARS-CoV-2 by  FDA under an Emergency Use Authorization (EUA). This EUA will remain  in effect (meaning this test can be used) for the duration of the  Covid-19 declaration under Section 564(b)(1) of the Act, 21  U.S.C. section 360bbb-3(b)(1), unless the authorization is  terminated or revoked. Performed at [facility], [place] [address], [place], [place] [address]          Radiology Studies: No results found.      Scheduled Meds: . cloNIDine  0.1 mg Oral BID  . FLUoxetine  40 mg Oral Daily  . influenza vaccine adjuvanted  0.5 mL Intramuscular Tomorrow-1000  . pantoprazole (PROTONIX) IV  40 mg Intravenous Q24H  . thiamine injection  100 mg Intravenous Daily   Continuous Infusions: . lactated ringers 100 mL/hr at 09/20/20 0935     LOS: 2 days     Georgette Shell, MD  09/21/2020, 11:27 AM

## 2020-09-21 NOTE — NC FL2 (Addendum)
[place] LEVEL OF CARE SCREENING TOOL     IDENTIFICATION  Patient Name: Jeffrey Snow Birthdate: Sep 07, 1956 Sex: male Admission Date (Current Location): 09/19/2020  [facility] and [place] Number:  Herbalist and Address:  [facility], [place] [address], [place], [place] [address]      Provider Number: [id number]  Attending Physician Name and Address:  Georgette Shell, MD  Relative Name and Phone Number:       Current Level of Care: Hospital Recommended Level of Care: [place] Prior Approval Number:    Date Approved/Denied:   PASRR Number:  pending  Discharge Plan: SNF    Current Diagnoses: Patient Active Problem List   Diagnosis Date Noted  . Traumatic rhabdomyolysis ([place]) 09/19/2020  . History of GI bleed 09/19/2020  . Rhabdomyolysis 09/19/2020  . Wheezing 11/28/2019  . Neuropathy 11/28/2019  . Anxiety 11/28/2019  . Acute upper GI bleed 09/05/2015  . Lactic acidosis 09/05/2015  . Hyperkalemia 09/05/2015  . Hemorrhagic shock ([place]) 09/05/2015  . Urinary tract infection, acute 08/31/2015  . Anemia, blood loss 12/16/2014  . Melena 12/16/2014  . Anemia 12/16/2014  . Abdominal pain, chronic, right upper quadrant 07/06/2014  . [place] (hepatocellular carcinoma) ([place]) 06/09/2014  . Other pancytopenia ([place]) 04/27/2014  . Cellulitis of leg 04/24/2014  . Elevated troponin 04/23/2014  . Normocytic anemia 04/21/2014  . Cirrhosis, alcoholic ([place]) 67/59/1638  . Hyponatremia 04/21/2014  . Hypokalemia 04/21/2014  . Metabolic acidosis 46/65/9935  . Hepatitis C 04/21/2014  . Abnormal MRI of abdomen, liver  04/21/2014  . Leg pain 04/21/2014  . Rash and nonspecific skin eruption 04/21/2014  . T wave inversion in EKG 04/21/2014  . Right foot pain 03/31/2014  . Hyperglycemia 03/03/2014  . Vitamin D deficiency disease 03/03/2014  . HTN (hypertension) 03/03/2014  . Elevated LFTs 03/03/2014  . Screening for colon  cancer 03/03/2014  . Annual physical exam 03/03/2014  . PVD (peripheral vascular disease) ([place]) 12/03/2013  . PAD (peripheral artery disease) ([place]) 10/26/2013  . Arterial insufficiency ([place]) 10/26/2013  . Fall 10/26/2013  . Depression 09/22/2013  . Insomnia 09/22/2013  . Claudication of left lower extremity ([place])   . Edema 08/20/2013  . Pain in limb 08/20/2013  . Atherosclerosis of native arteries of the extremities with intermittent claudication 08/20/2013  . Thrombocytopenia ([place]) 10/12/2012    Orientation RESPIRATION BLADDER Height & Weight     Self  Normal External catheter Weight: 84.6 kg Height:  5\' 8"  (172.7 cm)  BEHAVIORAL SYMPTOMS/MOOD NEUROLOGICAL BOWEL NUTRITION STATUS      Continent Diet (refer to d/c summary)  AMBULATORY STATUS COMMUNICATION OF NEEDS Skin   Extensive Assist Verbally Normal                       Personal Care Assistance Level of Assistance  Bathing, Feeding, Dressing Bathing Assistance: Maximum assistance Feeding assistance: Limited assistance Dressing Assistance: Maximum assistance     Functional Limitations Info  Sight, Hearing, Speech Sight Info: Adequate Hearing Info: Adequate Speech Info: Adequate    SPECIAL CARE FACTORS FREQUENCY  PT (By licensed PT), OT (By licensed OT)     PT Frequency: 5x /week, evaluate and treat OT Frequency: 5x/week, evaluate and treat            Contractures Contractures Info: Not present    Additional Factors Info  Code Status, Allergies Code Status Info: Full Code Allergies Info: Folic Acid, Oxycodone  Current Medications (09/21/2020):  This is the current hospital active medication list Current Facility-Administered Medications  Medication Dose Route Frequency Provider Last Rate Last Admin  . acetaminophen (TYLENOL) tablet 500 mg  500 mg Oral Q8H PRN Cherene Altes, MD      . albuterol (VENTOLIN HFA) 108 (90 Base) MCG/ACT inhaler 2 puff  2 puff Inhalation Q6H PRN Florina Ou  V, MD      . cloNIDine (CATAPRES) tablet 0.1 mg  0.1 mg Oral BID Cherene Altes, MD   0.1 mg at 09/21/20 0839  . FLUoxetine (PROZAC) capsule 40 mg  40 mg Oral Daily Cherene Altes, MD   40 mg at 09/21/20 0839  . influenza vaccine adjuvanted (FLUAD) injection 0.5 mL  0.5 mL Intramuscular Tomorrow-1000 Para Skeans, MD      . lactated ringers infusion   Intravenous Continuous Cherene Altes, MD 100 mL/hr at 09/20/20 0935 Rate Change at 09/20/20 0935  . oxyCODONE (Oxy IR/ROXICODONE) immediate release tablet 5 mg  5 mg Oral Q6H PRN Cherene Altes, MD   5 mg at 09/20/20 2051  . pantoprazole (PROTONIX) injection 40 mg  40 mg Intravenous Q24H Para Skeans, MD   40 mg at 09/21/20 1303  . thiamine (B-1) injection 100 mg  100 mg Intravenous Daily Para Skeans, MD   100 mg at 09/21/20 9833     Discharge Medications: Please see discharge summary for a list of discharge medications.  Relevant Imaging Results:  Relevant Lab Results:   Additional Information [phone]  Sharin Mons, RN

## 2020-09-22 LAB — RESPIRATORY PANEL BY RT PCR (FLU A&B, COVID)
Influenza A by PCR: NEGATIVE
Influenza B by PCR: NEGATIVE
SARS Coronavirus 2 by RT PCR: NEGATIVE

## 2020-09-22 MED ORDER — THIAMINE HCL 100 MG PO TABS
100.0000 mg | ORAL_TABLET | Freq: Every day | ORAL | Status: DC
  Administered 2020-09-22 – 2020-09-23 (×2): 100 mg via ORAL
  Filled 2020-09-22 (×2): qty 1

## 2020-09-22 NOTE — TOC Progression Note (Signed)
Transition of Care [facility]) - Progression Note    Patient Details  Name: Jeffrey Snow MRN: [id number] Date of Birth: Apr 13, 1956  [facility]) CM/SW Contact  Sharin Mons, RN Phone Number: 09/22/2020, 4:19 PM  Clinical Narrative:    Late entry 10/7 1330  Admitted with traumatic rhabdo, thrombocytopenia RNCM received consult for possible SNF placement at time of discharge. RNCM spoke with patient's bother [name] [facility]) regarding PT recommendation of SNF placement at time of discharge. Patient confused. PTA lives alone. Charles reports  Pt   is currently unable to care for self  at  home given patient's current physical needs and fall risk. Charles expressed understanding of PT recommendation and is agreeable to SNF placement at time of discharge. Patient reports preference for [name] . RNCM discussed insurance authorization process and shared Medicare SNF ratings list. No further questions reported at this time. RNCM to continue to follow and assist with discharge planning needs.    Expected Discharge Plan: Skilled Nursing Facility Barriers to Discharge: Continued Medical Work up  Expected Discharge Plan and Services Expected Discharge Plan: [name]                                               Social Determinants of Health (SDOH) Interventions    Readmission Risk Interventions No flowsheet data found.

## 2020-09-22 NOTE — Progress Notes (Signed)
PROGRESS NOTE    Jeffrey Snow  GLO:[id number] DOB: 01/05/1956 DOA: 09/19/2020 PCP: Azzie Glatter, FNP   Brief Narrative: 64 year old with a history of alcoholic cirrhosis and hepatocellular carcinoma followed at [facility] who was found on the floor at his home by EMS when his home emergency alert system summoned them.  He was unkempt and covered in feces when found.  In the ED he was oriented only to self but was cooperative.  His ammonia was not significantly elevated and an alcohol level was negative.  He was found to be in rhabdomyolysis with a CK of >8000.   Assessment & Plan:   Principal Problem:   Fall Active Problems:   Thrombocytopenia ([place])   PAD (peripheral artery disease) (HCC)   HTN (hypertension)   HCC (hepatocellular carcinoma) (HCC)   Traumatic rhabdomyolysis ([place])   History of GI bleed  #1 status post traumatic rhabdomyolysis resolved with IV fluids.  #2 altered mental status-patient was found on the floor and unkept situation.  He lives alone.  Unclear what caused him to pass out.  Unclear if he really did pass out. No further episodes during the hospital stay. Patient lives alone and cannot return home very unsteady gait. Seen by physical therapy recommending SNF.  Case manager consulted. Work-up included CT head and CT of the cervical spine with no acute changes, UA no evidence of infection.  Urine drug screen was positive for benzos and opiates which are prescribed to him.  Normal TSH.  B12 normal.  Ammonia 38. Patient at high fall risk he requires repetition and maximum cueing to initiate a task.  #3 history of alcoholic cirrhosis of the liver/hepatocellular carcinoma followed by oncology as outpatient  #4 coagulopathy/chronic thrombocytopenia stable no evidence of active bleeding.  Platelet count 25.  INR 1.8. #5 history of essential hypertension blood pressure 138/59  #6 hypomagnesemia 1 magnesium 1.6 repleted potassium 3.7.  Estimated body  mass index is 28.36 kg/m as calculated from the following:   Height as of this encounter: 5\' 8"  (1.727 m).   Weight as of this encounter: 84.6 kg.  DVT prophylaxis: None due to severe thrombocytopenia  code Status: Full code Family Communication: None at bedside Disposition Plan:  Status is: Inpatient  Dispo: The patient is from: Home              Anticipated d/c is to: SNF              Anticipated d/c date is: 1 day              Patient currently is medically stable to be discharged  patient is admitted with acute change in mental status with AKI and rhabdomyolysis on IV fluids.   Consultants:   None  Procedures: None Antimicrobials: None  Subjective: He is resting in bed he is awake at times confused Objective: Vitals:   09/21/20 0359 09/21/20 1936 09/22/20 0355 09/22/20 1300  BP: (!) 138/59 (!) 158/63 (!) 137/59 136/66  Pulse: (!) 55 64 65 67  Resp: 17  18 16   Temp: 98.5 F (36.9 C) 98.5 F (36.9 C) 97.9 F (36.6 C) 98.6 F (37 C)  TempSrc: Oral Oral Oral Oral  SpO2: 94% 93% 95% 98%  Weight:      Height:        Intake/Output Summary (Last 24 hours) at 09/22/2020 1511 Last data filed at 09/22/2020 1300 Gross per 24 hour  Intake 731.29 ml  Output 1050 ml  Net -  318.71 ml   Filed Weights   09/20/20 2352  Weight: 84.6 kg    Examination:  General exam: Appears calm and comfortable  Respiratory system: Clear to auscultation. Respiratory effort normal. Cardiovascular system: S1 & S2 heard, RRR. No JVD, murmurs, rubs, gallops or clicks. No pedal edema. Gastrointestinal system: Abdomen is nondistended, soft and nontender. No organomegaly or masses felt. Normal bowel sounds heard. Central nervous system: Confused  extremities: Symmetric 5 x 5 power. Skin: No rashes, lesions or ulcers Psychiatry unable to assess.     Data Reviewed: I have personally reviewed following labs and imaging studies  CBC: Recent Labs  Lab 09/19/20 0847 09/20/20 0446  09/21/20 0239  WBC 6.7 4.0 2.9*  NEUTROABS 5.5  --   --   HGB 14.7 12.7* 12.7*  HCT 44.6 39.0 38.2*  MCV 93.3 95.1 95.0  PLT 33* 26* 25*   Basic Metabolic Panel: Recent Labs  Lab 09/19/20 0847 09/20/20 0446 09/21/20 0239  NA 141 141 141  K 4.6 3.9 3.7  CL 106 111 110  CO2 23 21* 22  GLUCOSE 97 76 77  BUN 17 17 14   CREATININE 1.06 0.96 0.86  CALCIUM 9.3 8.5* 8.2*  MG  --  1.8 1.6*  PHOS  --  3.5  --    GFR: Estimated Creatinine Clearance: 90.7 mL/min (by C-G formula based on SCr of 0.86 mg/dL). Liver Function Tests: Recent Labs  Lab 09/19/20 0847 09/20/20 0446 09/21/20 0239  AST 330* 177* 116*  ALT 65* 47* 49*  ALKPHOS 97 75 72  BILITOT 5.2* 3.8* 3.5*  PROT 6.4* 5.1* 4.8*  ALBUMIN 3.5 2.6* 2.6*   Recent Labs  Lab 09/19/20 0847  LIPASE 32   Recent Labs  Lab 09/19/20 0845  AMMONIA 38*   Coagulation Profile: Recent Labs  Lab 09/19/20 0847 09/20/20 0446  INR 1.7* 1.8*   Cardiac Enzymes: Recent Labs  Lab 09/19/20 0847 09/21/20 0239  CKTOTAL 8,810* 775*   BNP (last 3 results) No results for input(s): PROBNP in the last 8760 hours. HbA1C: No results for input(s): HGBA1C in the last 72 hours. CBG: No results for input(s): GLUCAP in the last 168 hours. Lipid Profile: No results for input(s): CHOL, HDL, LDLCALC, TRIG, CHOLHDL, LDLDIRECT in the last 72 hours. Thyroid Function Tests: No results for input(s): TSH, T4TOTAL, FREET4, T3FREE, THYROIDAB in the last 72 hours. Anemia Panel: No results for input(s): VITAMINB12, FOLATE, FERRITIN, TIBC, IRON, RETICCTPCT in the last 72 hours. Sepsis Labs: No results for input(s): PROCALCITON, LATICACIDVEN in the last 168 hours.  Recent Results (from the past 240 hour(s))  Respiratory Panel by RT PCR (Flu A&B, Covid) - Nasopharyngeal Swab     Status: None   Collection Time: 09/19/20 10:17 AM   Specimen: Nasopharyngeal Swab  Result Value Ref Range Status   SARS Coronavirus 2 by RT PCR NEGATIVE NEGATIVE Final     Comment: (NOTE) SARS-CoV-2 target nucleic acids are NOT DETECTED.  The SARS-CoV-2 RNA is generally detectable in upper respiratoy specimens during the acute phase of infection. The lowest concentration of SARS-CoV-2 viral copies this assay can detect is 131 copies/mL. A negative result does not preclude SARS-Cov-2 infection and should not be used as the sole basis for treatment or other patient management decisions. A negative result may occur with  improper specimen collection/handling, submission of specimen other than nasopharyngeal swab, presence of viral mutation(s) within the areas targeted by this assay, and inadequate number of viral copies (<131 copies/mL). A negative result  must be combined with clinical observations, patient history, and epidemiological information. The expected result is Negative.  Fact Sheet for Patients:  PinkCheek.be  Fact Sheet for Healthcare Providers:  GravelBags.it  This test is no t yet approved or cleared by the [place] FDA and  has been authorized for detection and/or diagnosis of SARS-CoV-2 by FDA under an Emergency Use Authorization (EUA). This EUA will remain  in effect (meaning this test can be used) for the duration of the COVID-19 declaration under Section 564(b)(1) of the Act, 21 U.S.C. section 360bbb-3(b)(1), unless the authorization is terminated or revoked sooner.     Influenza A by PCR NEGATIVE NEGATIVE Final   Influenza B by PCR NEGATIVE NEGATIVE Final    Comment: (NOTE) The Xpert Xpress SARS-CoV-2/FLU/RSV assay is intended as an aid in  the diagnosis of influenza from Nasopharyngeal swab specimens and  should not be used as a sole basis for treatment. Nasal washings and  aspirates are unacceptable for Xpert Xpress SARS-CoV-2/FLU/RSV  testing.  Fact Sheet for Patients: PinkCheek.be  Fact Sheet for Healthcare  Providers: GravelBags.it  This test is not yet approved or cleared by the [place] FDA and  has been authorized for detection and/or diagnosis of SARS-CoV-2 by  FDA under an Emergency Use Authorization (EUA). This EUA will remain  in effect (meaning this test can be used) for the duration of the  Covid-19 declaration under Section 564(b)(1) of the Act, 21  U.S.C. section 360bbb-3(b)(1), unless the authorization is  terminated or revoked. Performed at [facility], [place] [address], [place], [place] [address]          Radiology Studies: No results found.      Scheduled Meds: . cloNIDine  0.1 mg Oral BID  . FLUoxetine  40 mg Oral Daily  . pantoprazole (PROTONIX) IV  40 mg Intravenous Q24H  . thiamine  100 mg Oral Daily   Continuous Infusions:    LOS: 3 days     Georgette Shell, MD  09/22/2020, 3:11 PM

## 2020-09-22 NOTE — Care Management Important Message (Signed)
Important Message  Patient Details  Name: COLBERT CURENTON MRN: [id number] Date of Birth: 1956-06-10   Medicare Important Message Given:  Yes - Important Message mailed due to current National Emergency    Verbal consent obtained due to current National Emergency  Relationship to patient: Brother/Sister Contact Name: Kane Kusek Call Date: 09/22/20  Time: 1547 Phone: [phone] Outcome: Spoke with contact Important Message mailed to: Other (must enter comment) ([place] [address])     Orbie Pyo 09/22/2020, 3:49 PM

## 2020-09-22 NOTE — Progress Notes (Signed)
Pt concerned about small leakage of blood around insertion site for iv- requested it be removed- site dc'd

## 2020-09-22 NOTE — Plan of Care (Signed)

## 2020-09-22 NOTE — Progress Notes (Signed)
Physical Therapy Treatment Patient Details Name: Jeffrey Snow MRN: [id number] DOB: 08/12/1956 Today's Date: 09/22/2020    History of Present Illness Pt is a 64 y.o. M with significant PMH of [place] who presents after being found on the floor at home. Admitted with traumatic rhabdo, thrombocytopenia. CT head showing no acute abnormalities.     PT Comments    Pt supine on arrival, agreeable to therapy session with encouragement, with fair participation and tolerance for session. Pt with circular thought patterns and difficulty maintaining focus on therapy session, needing frequent redirection of task and greatly increased time to initiate and perform mobility tasks. Pt mostly Supervision to minA for bed mobility and transfers, focus on progressing seated/standing tolerance. Pt adamantly refusing transfer to chair despite max encouragement and remained in bed at end of session. Pt continues to benefit from PT services to progress toward functional mobility goals. Continue to recommend SNF level of rehab.   Follow Up Recommendations  SNF     Equipment Recommendations  3in1 (PT);Wheelchair (measurements PT);Wheelchair cushion (measurements PT) ((if pt instead D/C home))    Recommendations for Other Services OT consult     Precautions / Restrictions Precautions Precautions: Fall Restrictions Weight Bearing Restrictions: No    Mobility  Bed Mobility Overal bed mobility: Needs Assistance Bed Mobility: Supine to Sit;Sit to Supine     Supine to sit: Supervision;HOB elevated Sit to supine: Min guard (HOB flat)   General bed mobility comments: Supervision with increased time/effort  Transfers Overall transfer level: Needs assistance Equipment used: Rolling walker (2 wheeled) Transfers: Sit to/from Stand Sit to Stand: Min assist         General transfer comment: minA for sit<>stand x2 reps, greatly increased time to initiate and perform and cues for sequencing/safe hand placement  needed with fair carryover  Ambulation/Gait Ambulation/Gait assistance: Min assist Gait Distance (Feet): 3 Feet Assistive device: Rolling walker (2 wheeled) Gait Pattern/deviations: Shuffle;Decreased stride length;Decreased step length - left;Decreased step length - right;Festinating     General Gait Details: small shuffled lateral steps toward HOB, pt with inconsistent foot placement and needs max cues/minA to stabilize AD    Balance Overall balance assessment: Needs assistance;History of Falls Sitting-balance support: Feet supported Sitting balance-Leahy Scale: Fair Sitting balance - Comments: able to sit EOB ~10 mins between functional tasks without LOB   Standing balance support: Bilateral upper extremity supported Standing balance-Leahy Scale: Poor Standing balance comment: reliant on UE support           Cognition Arousal/Alertness: Awake/alert Behavior During Therapy: Anxious;Restless Overall Cognitive Status: Impaired/Different from baseline Area of Impairment: Orientation;Attention;Memory;Following commands;Safety/judgement;Awareness;Problem solving      Orientation Level: Time;Situation (stating month as "may" and unable to state year)   Memory: Decreased short-term memory Following Commands: Follows one step commands with increased time;Follows one step commands inconsistently Safety/Judgement: Decreased awareness of safety;Decreased awareness of deficits   Problem Solving: Difficulty sequencing;Requires verbal cues General Comments: Pt oriented to self and location only, perseverating on concern for disposition and plan of care. Pt able to be redirected but then quickly back on topic of not wanting people to tell him to eat, concern for what will happen to his car and home, concern for how he was found, etc.      Exercises Other Exercises Other Exercises: pt encouraged to perform hip flexion, ankle pumps, heel slides, but deferring to complete, very distracted  throughout.    General Comments        Pertinent Vitals/Pain Pain Assessment:  Faces Faces Pain Scale: Hurts even more Pain Location: abdomen and L occipital headache Pain Descriptors / Indicators: Discomfort;Headache;Sharp Pain Intervention(s): Monitored during session;Patient requesting pain meds-RN notified;Limited activity within patient's tolerance           PT Goals (current goals can now be found in the care plan section) Acute Rehab PT Goals Patient Stated Goal: go home PT Goal Formulation: With patient Time For Goal Achievement: 10/04/20 Potential to Achieve Goals: Fair Progress towards PT goals: Progressing toward goals    Frequency    Min 2X/week      PT Plan Current plan remains appropriate       AM-PAC PT "6 Clicks" Mobility   Outcome Measure  Help needed turning from your back to your side while in a flat bed without using bedrails?: None Help needed moving from lying on your back to sitting on the side of a flat bed without using bedrails?: A Little Help needed moving to and from a bed to a chair (including a wheelchair)?: A Little Help needed standing up from a chair using your arms (e.g., wheelchair or bedside chair)?: A Little Help needed to walk in hospital room?: A Lot Help needed climbing 3-5 steps with a railing? : Total 6 Click Score: 16    End of Session Equipment Utilized During Treatment: Gait belt Activity Tolerance: Patient limited by pain Patient left: in bed;with bed alarm set;with call bell/phone within reach (fall mat next to bed) Nurse Communication: Mobility status;Patient requests pain meds PT Visit Diagnosis: Unsteadiness on feet (R26.81);History of falling (Z91.81);Muscle weakness (generalized) (M62.81);Difficulty in walking, not elsewhere classified (R26.2)     Time: 7078-6754 PT Time Calculation (min) (ACUTE ONLY): 31 min  Charges:  $Therapeutic Activity: 8-22 mins                     Serra Younan P., PTA Acute  Rehabilitation Services Pager: [phone] Office: [place] 09/22/2020, 5:07 PM

## 2020-09-22 NOTE — TOC Progression Note (Signed)
Transition of Care [facility]) - Progression Note    Patient Details  Name: Jeffrey Snow MRN: [id number] Date of Birth: 09-26-1956  Transition of Care [facility]) CM/SW Contact  Sharin Mons, RN Phone Number: [phone] 09/22/2020, 4:34 PM  Clinical Narrative:    NCM shared SNF offers with pt's brother [name]. [name] (preference) unable to extend bed offer. However, [facility]. extended bed offer and accept by pt's brother. SNF bed will be available on tomorrow. Discharge summary needed from MD no later than 12 noon in order for  SNF acceptance on tomorrow, MD made aware.   Expected Discharge Plan: Skilled Nursing Facility Barriers to Discharge: Continued Medical Work up  Expected Discharge Plan and Services Expected Discharge Plan: [place]                                               Social Determinants of Health (SDOH) Interventions    Readmission Risk Interventions No flowsheet data found.

## 2020-09-23 MED ORDER — PANTOPRAZOLE SODIUM 40 MG PO TBEC
40.0000 mg | DELAYED_RELEASE_TABLET | Freq: Every day | ORAL | Status: DC
  Administered 2020-09-23: 40 mg via ORAL
  Filled 2020-09-23: qty 1

## 2020-09-23 NOTE — Plan of Care (Signed)

## 2020-09-23 NOTE — Progress Notes (Signed)
Harris, gave report to charge nurse.

## 2020-09-23 NOTE — Discharge Summary (Signed)
Physician Discharge Summary  Jeffrey Snow:[id number] DOB: [birth year] DOA: 09/19/2020  PCP: Azzie Glatter, FNP  Admit date: 09/19/2020 Discharge date: 09/23/2020  Admitted From: Home Disposition: Nursing home Recommendations for Outpatient Follow-up:  Follow up with PCP in 1-2 weeks Please obtain BMP/CBC in one week he is on Lasix and Aldactone we need to make sure his electrolytes are normal.   Home Health none Equipment/Devices: None  discharge Condition: Stable CODE STATUS: Full code Diet recommendation: Cardiac Brief/Interim Summary: 64 year old with a history of alcoholic cirrhosis and hepatocellular carcinoma followed at [facility] who was found on the floor at his home by EMS when his home emergency alert system summoned them. He was unkempt and covered in feces when found. In the ED he was oriented only to self but was cooperative. His ammonia was not significantly elevated and an alcohol level was negative. He was found to be in rhabdomyolysis witha CK of >8000 Discharge Diagnoses:  Principal Problem:   Fall Active Problems:   Thrombocytopenia ([place])   PAD (peripheral artery disease) (HCC)   HTN (hypertension)   HCC (hepatocellular carcinoma) (HCC)   Traumatic rhabdomyolysis ([place])   History of GI bleed  #1 status post traumatic rhabdomyolysis resolved with IV fluids.  #2 altered mental status-patient was found on the floor and unkept situation.  He lives alone.  Unclear what caused him to pass out.  Unclear if he really did pass out. No further episodes during the hospital stay. Patient lives alone and cannot return home very unsteady gait.  He has.  Some confusion at baseline. Seen by physical therapy recommending SNF.  Work-up included CT head and CT of the cervical spine with no acute changes, UA no evidence of infection.  Urine drug screen was positive for benzos and opiates which are prescribed to him.  Normal TSH.  B12 normal.  Ammonia  38. Patient at high fall risk he requires repetition and maximum cueing to initiate a task.  #3 history of alcoholic cirrhosis of the liver/hepatocellular carcinoma followed by oncology as outpatient  #4 coagulopathy/chronic thrombocytopenia stable no evidence of active bleeding.  Platelet count 25.  INR 1.8.  #5 history of essential hypertension blood pressure 138/59 continue Lasix and Aldactone  #6 hypomagnesemia 1 magnesium 1.6 repleted potassium 3.7.   Estimated body mass index is 28.36 kg/m as calculated from the following:   Height as of this encounter: 5\' 8"  (1.727 m).   Weight as of this encounter: 84.6 kg.  Discharge Instructions  Discharge Instructions    Diet - low sodium heart healthy   Complete by: As directed    Increase activity slowly   Complete by: As directed      Allergies as of 09/23/2020      Reactions   Folic Acid Other (See Comments)   Makes stomach feel funny   Oxycodone Itching, Other (See Comments)   Cause insomnia      Medication List    STOP taking these medications   acetaminophen-codeine 300-30 MG tablet Commonly known as: TYLENOL #3   ALPRAZolam 1 MG tablet Commonly known as: XANAX     TAKE these medications   albuterol 108 (90 Base) MCG/ACT inhaler Commonly known as: VENTOLIN HFA Inhale 2 puffs into the lungs every 6 (six) hours as needed for wheezing or shortness of breath.   bisacodyl 5 MG EC tablet Commonly known as: DULCOLAX Take 5 mg by mouth daily as needed for mild constipation or moderate constipation. Reported on 04/03/2016  FLUoxetine 40 MG capsule Commonly known as: PROZAC Take 40 mg by mouth daily.   furosemide 20 MG tablet Commonly known as: LASIX TAKE 1 TABLET BY MOUTH EVERY MORNING What changed:   how much to take  how to take this  when to take this  additional instructions   gabapentin 300 MG capsule Commonly known as: NEURONTIN Take 1 capsule (300 mg total) by mouth 3 (three) times daily.    hydrOXYzine 10 MG tablet Commonly known as: ATARAX/VISTARIL Take 1 tablet (10 mg total) by mouth 3 (three) times daily as needed. What changed: when to take this   pantoprazole 40 MG tablet Commonly known as: PROTONIX TAKE 1 TABLET(40 MG) BY MOUTH TWICE DAILY BEFORE A MEAL What changed:   how much to take  how to take this  when to take this  additional instructions   spironolactone 25 MG tablet Commonly known as: ALDACTONE TAKE 3 TABLETS(75 MG) BY MOUTH DAILY What changed: See the new instructions.   vitamin C 250 MG tablet Commonly known as: ASCORBIC ACID Take 500 mg by mouth daily.       Follow-up Information    Azzie Glatter, FNP Follow up.   Specialty: Family Medicine Contact information: [address] [place] [address] [phone]              Allergies  Allergen Reactions  . Folic Acid Other (See Comments)    Makes stomach feel funny  . Oxycodone Itching and Other (See Comments)    Cause insomnia    Consultations: None   Procedures/Studies: CT Head Wo Contrast  Result Date: 09/19/2020 CLINICAL DATA:  Intoxication, fell striking head, neck pain, occipital hematoma EXAM: CT HEAD WITHOUT CONTRAST CT CERVICAL SPINE WITHOUT CONTRAST TECHNIQUE: Multidetector CT imaging of the head and cervical spine was performed following the standard protocol without intravenous contrast. Multiplanar CT image reconstructions of the cervical spine were also generated. COMPARISON:  None FINDINGS: CT HEAD FINDINGS Brain: Generalized atrophy. Normal ventricular morphology. No midline shift or mass effect. Small vessel chronic ischemic changes of deep cerebral white matter. No intracranial hemorrhage, mass lesion, evidence of acute infarction, or extra-axial fluid collection. Vascular: Atherosclerotic calcification of internal carotid arteries at skull base. No hyperdense vessels. Skull: Intact Sinuses/Orbits: Clear Other: N/A CT CERVICAL SPINE FINDINGS Alignment:  Normal Skull base and vertebrae: Skull base intact. Incomplete posterior arch C1, developmental anomaly. Vertebral body heights maintained. Disc space narrowing and endplate spur formation at C5-C6. Schmorl's node inferior endplate C3. No fracture, subluxation or bone destruction. Soft tissues and spinal canal: Prevertebral soft tissues normal thickness. Atherosclerotic calcifications at the carotid bifurcations bilaterally as well as proximal great vessels. Disc levels:  No specific abnormalities Upper chest: Emphysematous changes at lung apices Other: N/A IMPRESSION: Atrophy with small vessel chronic ischemic changes of deep cerebral white matter. No acute intracranial abnormalities. Degenerative disc disease changes at C5-C6. No acute cervical spine abnormalities. Electronically Signed   By: Lavonia Dana M.D.   On: 09/19/2020 09:56   CT Cervical Spine Wo Contrast  Result Date: 09/19/2020 CLINICAL DATA:  Intoxication, fell striking head, neck pain, occipital hematoma EXAM: CT HEAD WITHOUT CONTRAST CT CERVICAL SPINE WITHOUT CONTRAST TECHNIQUE: Multidetector CT imaging of the head and cervical spine was performed following the standard protocol without intravenous contrast. Multiplanar CT image reconstructions of the cervical spine were also generated. COMPARISON:  None FINDINGS: CT HEAD FINDINGS Brain: Generalized atrophy. Normal ventricular morphology. No midline shift or mass effect. Small vessel chronic  ischemic changes of deep cerebral white matter. No intracranial hemorrhage, mass lesion, evidence of acute infarction, or extra-axial fluid collection. Vascular: Atherosclerotic calcification of internal carotid arteries at skull base. No hyperdense vessels. Skull: Intact Sinuses/Orbits: Clear Other: N/A CT CERVICAL SPINE FINDINGS Alignment: Normal Skull base and vertebrae: Skull base intact. Incomplete posterior arch C1, developmental anomaly. Vertebral body heights maintained. Disc space narrowing and  endplate spur formation at C5-C6. Schmorl's node inferior endplate C3. No fracture, subluxation or bone destruction. Soft tissues and spinal canal: Prevertebral soft tissues normal thickness. Atherosclerotic calcifications at the carotid bifurcations bilaterally as well as proximal great vessels. Disc levels:  No specific abnormalities Upper chest: Emphysematous changes at lung apices Other: N/A IMPRESSION: Atrophy with small vessel chronic ischemic changes of deep cerebral white matter. No acute intracranial abnormalities. Degenerative disc disease changes at C5-C6. No acute cervical spine abnormalities. Electronically Signed   By: Lavonia Dana M.D.   On: 09/19/2020 09:56    (Echo, Carotid, EGD, Colonoscopy, ERCP)    Subjective: Patient is resting in bed in no acute distress  Discharge Exam: Vitals:   09/23/20 0352 09/23/20 0742  BP: 122/65 (!) 154/64  Pulse: (!) 52 65  Resp: 18 18  Temp: 98 F (36.7 C) 98.3 F (36.8 C)  SpO2: 96% 97%   Vitals:   09/22/20 1300 09/22/20 2041 09/23/20 0352 09/23/20 0742  BP: 136/66 (!) 151/64 122/65 (!) 154/64  Pulse: 67 62 (!) 52 65  Resp: 16 17 18 18   Temp: 98.6 F (37 C) 98 F (36.7 C) 98 F (36.7 C) 98.3 F (36.8 C)  TempSrc: Oral Oral Oral   SpO2: 98% 97% 96% 97%  Weight:      Height:        General: Pt is alert, awake, not in acute distress Cardiovascular: RRR, S1/S2 +, no rubs, no gallops Respiratory: CTA bilaterally, no wheezing, no rhonchi Abdominal: Soft, NT, ND, bowel sounds + Extremities: no edema, no cyanosis    The results of significant diagnostics from this hospitalization (including imaging, microbiology, ancillary and laboratory) are listed below for reference.     Microbiology: Recent Results (from the past 240 hour(s))  Respiratory Panel by RT PCR (Flu A&B, Covid) - Nasopharyngeal Swab     Status: None   Collection Time: 09/19/20 10:17 AM   Specimen: Nasopharyngeal Swab  Result Value Ref Range Status   SARS  Coronavirus 2 by RT PCR NEGATIVE NEGATIVE Final    Comment: (NOTE) SARS-CoV-2 target nucleic acids are NOT DETECTED.  The SARS-CoV-2 RNA is generally detectable in upper respiratoy specimens during the acute phase of infection. The lowest concentration of SARS-CoV-2 viral copies this assay can detect is 131 copies/mL. A negative result does not preclude SARS-Cov-2 infection and should not be used as the sole basis for treatment or other patient management decisions. A negative result may occur with  improper specimen collection/handling, submission of specimen other than nasopharyngeal swab, presence of viral mutation(s) within the areas targeted by this assay, and inadequate number of viral copies (<131 copies/mL). A negative result must be combined with clinical observations, patient history, and epidemiological information. The expected result is Negative.  Fact Sheet for Patients:  PinkCheek.be  Fact Sheet for Healthcare Providers:  GravelBags.it  This test is no t yet approved or cleared by the [place] FDA and  has been authorized for detection and/or diagnosis of SARS-CoV-2 by FDA under an Emergency Use Authorization (EUA). This EUA will remain  in effect (meaning this test  can be used) for the duration of the COVID-19 declaration under Section 564(b)(1) of the Act, 21 U.S.C. section 360bbb-3(b)(1), unless the authorization is terminated or revoked sooner.     Influenza A by PCR NEGATIVE NEGATIVE Final   Influenza B by PCR NEGATIVE NEGATIVE Final    Comment: (NOTE) The Xpert Xpress SARS-CoV-2/FLU/RSV assay is intended as an aid in  the diagnosis of influenza from Nasopharyngeal swab specimens and  should not be used as a sole basis for treatment. Nasal washings and  aspirates are unacceptable for Xpert Xpress SARS-CoV-2/FLU/RSV  testing.  Fact Sheet for  Patients: PinkCheek.be  Fact Sheet for Healthcare Providers: GravelBags.it  This test is not yet approved or cleared by the [place] FDA and  has been authorized for detection and/or diagnosis of SARS-CoV-2 by  FDA under an Emergency Use Authorization (EUA). This EUA will remain  in effect (meaning this test can be used) for the duration of the  Covid-19 declaration under Section 564(b)(1) of the Act, 21  U.S.C. section 360bbb-3(b)(1), unless the authorization is  terminated or revoked. Performed at [facility], [place] [address] [place], [place] [address]   Respiratory Panel by RT PCR (Flu A&B, Covid) - Nasopharyngeal Swab     Status: None   Collection Time: 09/22/20  5:01 PM   Specimen: Nasopharyngeal Swab  Result Value Ref Range Status   SARS Coronavirus 2 by RT PCR NEGATIVE NEGATIVE Final    Comment: (NOTE) SARS-CoV-2 target nucleic acids are NOT DETECTED.  The SARS-CoV-2 RNA is generally detectable in upper respiratoy specimens during the acute phase of infection. The lowest concentration of SARS-CoV-2 viral copies this assay can detect is 131 copies/mL. A negative result does not preclude SARS-Cov-2 infection and should not be used as the sole basis for treatment or other patient management decisions. A negative result may occur with  improper specimen collection/handling, submission of specimen other than nasopharyngeal swab, presence of viral mutation(s) within the areas targeted by this assay, and inadequate number of viral copies (<131 copies/mL). A negative result must be combined with clinical observations, patient history, and epidemiological information. The expected result is Negative.  Fact Sheet for Patients:  PinkCheek.be  Fact Sheet for Healthcare Providers:  GravelBags.it  This test is no t yet approved or cleared by the [place] FDA and  has been authorized for detection and/or diagnosis of SARS-CoV-2 by FDA under an Emergency Use Authorization (EUA). This EUA will remain  in effect (meaning this test can be used) for the duration of the COVID-19 declaration under Section 564(b)(1) of the Act, 21 U.S.C. section 360bbb-3(b)(1), unless the authorization is terminated or revoked sooner.     Influenza A by PCR NEGATIVE NEGATIVE Final   Influenza B by PCR NEGATIVE NEGATIVE Final    Comment: (NOTE) The Xpert Xpress SARS-CoV-2/FLU/RSV assay is intended as an aid in  the diagnosis of influenza from Nasopharyngeal swab specimens and  should not be used as a sole basis for treatment. Nasal washings and  aspirates are unacceptable for Xpert Xpress SARS-CoV-2/FLU/RSV  testing.  Fact Sheet for Patients: PinkCheek.be  Fact Sheet for Healthcare Providers: GravelBags.it  This test is not yet approved or cleared by the [place] FDA and  has been authorized for detection and/or diagnosis of SARS-CoV-2 by  FDA under an Emergency Use Authorization (EUA). This EUA will remain  in effect (meaning this test can be used) for the duration of the  Covid-19 declaration under Section 564(b)(1) of  the Act, 21  U.S.C. section 360bbb-3(b)(1), unless the authorization is  terminated or revoked. Performed at [facility], [place] [address] [place], [place] [address]      Labs: BNP (last 3 results) No results for input(s): BNP in the last 8760 hours. Basic Metabolic Panel: Recent Labs  Lab 09/19/20 0847 09/20/20 0446 09/21/20 0239  NA 141 141 141  K 4.6 3.9 3.7  CL 106 111 110  CO2 23 21* 22  GLUCOSE 97 76 77  BUN 17 17 14   CREATININE 1.06 0.96 0.86  CALCIUM 9.3 8.5* 8.2*  MG  --  1.8 1.6*  PHOS  --  3.5  --    Liver Function Tests: Recent Labs  Lab 09/19/20 0847 09/20/20 0446 09/21/20 0239  AST 330* 177* 116*  ALT 65* 47* 49*  ALKPHOS 97  75 72  BILITOT 5.2* 3.8* 3.5*  PROT 6.4* 5.1* 4.8*  ALBUMIN 3.5 2.6* 2.6*   Recent Labs  Lab 09/19/20 0847  LIPASE 32   Recent Labs  Lab 09/19/20 0845  AMMONIA 38*   CBC: Recent Labs  Lab 09/19/20 0847 09/20/20 0446 09/21/20 0239  WBC 6.7 4.0 2.9*  NEUTROABS 5.5  --   --   HGB 14.7 12.7* 12.7*  HCT 44.6 39.0 38.2*  MCV 93.3 95.1 95.0  PLT 33* 26* 25*   Cardiac Enzymes: Recent Labs  Lab 09/19/20 0847 09/21/20 0239  CKTOTAL 8,810* 775*   BNP: Invalid input(s): POCBNP CBG: No results for input(s): GLUCAP in the last 168 hours. D-Dimer No results for input(s): DDIMER in the last 72 hours. Hgb A1c No results for input(s): HGBA1C in the last 72 hours. Lipid Profile No results for input(s): CHOL, HDL, LDLCALC, TRIG, CHOLHDL, LDLDIRECT in the last 72 hours. Thyroid function studies No results for input(s): TSH, T4TOTAL, T3FREE, THYROIDAB in the last 72 hours.  Invalid input(s): FREET3 Anemia work up No results for input(s): VITAMINB12, FOLATE, FERRITIN, TIBC, IRON, RETICCTPCT in the last 72 hours. Urinalysis    Component Value Date/Time   COLORURINE AMBER (A) 09/19/2020 1031   APPEARANCEUR CLEAR 09/19/2020 1031   LABSPEC 1.023 09/19/2020 1031   PHURINE 6.0 09/19/2020 1031   GLUCOSEU NEGATIVE 09/19/2020 1031   HGBUR NEGATIVE 09/19/2020 1031   BILIRUBINUR NEGATIVE 09/19/2020 1031   BILIRUBINUR Negative 11/26/2019 1134   KETONESUR 20 (A) 09/19/2020 1031   PROTEINUR NEGATIVE 09/19/2020 1031   UROBILINOGEN 0.2 11/26/2019 1134   UROBILINOGEN 0.2 12/17/2017 1129   NITRITE NEGATIVE 09/19/2020 1031   LEUKOCYTESUR NEGATIVE 09/19/2020 1031   Sepsis Labs Invalid input(s): PROCALCITONIN,  WBC,  LACTICIDVEN Microbiology Recent Results (from the past 240 hour(s))  Respiratory Panel by RT PCR (Flu A&B, Covid) - Nasopharyngeal Swab     Status: None   Collection Time: 09/19/20 10:17 AM   Specimen: Nasopharyngeal Swab  Result Value Ref Range Status   SARS Coronavirus  2 by RT PCR NEGATIVE NEGATIVE Final    Comment: (NOTE) SARS-CoV-2 target nucleic acids are NOT DETECTED.  The SARS-CoV-2 RNA is generally detectable in upper respiratoy specimens during the acute phase of infection. The lowest concentration of SARS-CoV-2 viral copies this assay can detect is 131 copies/mL. A negative result does not preclude SARS-Cov-2 infection and should not be used as the sole basis for treatment or other patient management decisions. A negative result may occur with  improper specimen collection/handling, submission of specimen other than nasopharyngeal swab, presence of viral mutation(s) within the areas targeted by this assay, and inadequate  number of viral copies (<131 copies/mL). A negative result must be combined with clinical observations, patient history, and epidemiological information. The expected result is Negative.  Fact Sheet for Patients:  PinkCheek.be  Fact Sheet for Healthcare Providers:  GravelBags.it  This test is no t yet approved or cleared by the [place] FDA and  has been authorized for detection and/or diagnosis of SARS-CoV-2 by FDA under an Emergency Use Authorization (EUA). This EUA will remain  in effect (meaning this test can be used) for the duration of the COVID-19 declaration under Section 564(b)(1) of the Act, 21 U.S.C. section 360bbb-3(b)(1), unless the authorization is terminated or revoked sooner.     Influenza A by PCR NEGATIVE NEGATIVE Final   Influenza B by PCR NEGATIVE NEGATIVE Final    Comment: (NOTE) The Xpert Xpress SARS-CoV-2/FLU/RSV assay is intended as an aid in  the diagnosis of influenza from Nasopharyngeal swab specimens and  should not be used as a sole basis for treatment. Nasal washings and  aspirates are unacceptable for Xpert Xpress SARS-CoV-2/FLU/RSV  testing.  Fact Sheet for Patients: PinkCheek.be  Fact Sheet  for Healthcare Providers: GravelBags.it  This test is not yet approved or cleared by the [place] FDA and  has been authorized for detection and/or diagnosis of SARS-CoV-2 by  FDA under an Emergency Use Authorization (EUA). This EUA will remain  in effect (meaning this test can be used) for the duration of the  Covid-19 declaration under Section 564(b)(1) of the Act, 21  U.S.C. section 360bbb-3(b)(1), unless the authorization is  terminated or revoked. Performed at [facility], [place] [address] [place],  [address]   Respiratory Panel by RT PCR (Flu A&B, Covid) - Nasopharyngeal Swab     Status: None   Collection Time: 09/22/20  5:01 PM   Specimen: Nasopharyngeal Swab  Result Value Ref Range Status   SARS Coronavirus 2 by RT PCR NEGATIVE NEGATIVE Final    Comment: (NOTE) SARS-CoV-2 target nucleic acids are NOT DETECTED.  The SARS-CoV-2 RNA is generally detectable in upper respiratoy specimens during the acute phase of infection. The lowest concentration of SARS-CoV-2 viral copies this assay can detect is 131 copies/mL. A negative result does not preclude SARS-Cov-2 infection and should not be used as the sole basis for treatment or other patient management decisions. A negative result may occur with  improper specimen collection/handling, submission of specimen other than nasopharyngeal swab, presence of viral mutation(s) within the areas targeted by this assay, and inadequate number of viral copies (<131 copies/mL). A negative result must be combined with clinical observations, patient history, and epidemiological information. The expected result is Negative.  Fact Sheet for Patients:  PinkCheek.be  Fact Sheet for Healthcare Providers:  GravelBags.it  This test is no t yet approved or cleared by the [place] FDA and  has been authorized for detection and/or diagnosis of  SARS-CoV-2 by FDA under an Emergency Use Authorization (EUA). This EUA will remain  in effect (meaning this test can be used) for the duration of the COVID-19 declaration under Section 564(b)(1) of the Act, 21 U.S.C. section 360bbb-3(b)(1), unless the authorization is terminated or revoked sooner.     Influenza A by PCR NEGATIVE NEGATIVE Final   Influenza B by PCR NEGATIVE NEGATIVE Final    Comment: (NOTE) The Xpert Xpress SARS-CoV-2/FLU/RSV assay is intended as an aid in  the diagnosis of influenza from Nasopharyngeal swab specimens and  should not be used as a sole basis for treatment. Nasal  washings and  aspirates are unacceptable for Xpert Xpress SARS-CoV-2/FLU/RSV  testing.  Fact Sheet for Patients: PinkCheek.be  Fact Sheet for Healthcare Providers: GravelBags.it  This test is not yet approved or cleared by the [place] FDA and  has been authorized for detection and/or diagnosis of SARS-CoV-2 by  FDA under an Emergency Use Authorization (EUA). This EUA will remain  in effect (meaning this test can be used) for the duration of the  Covid-19 declaration under Section 564(b)(1) of the Act, 21  U.S.C. section 360bbb-3(b)(1), unless the authorization is  terminated or revoked. Performed at [facility], [place] [address], [place], [place] [address]      Time coordinating discharge: 39 minutes  SIGNED:   Georgette Shell, MD  [facility] 09/23/2020, 9:29 AM

## 2020-09-23 NOTE — TOC Transition Note (Signed)
Transition of Care [facility]) - CM/SW Discharge Note   Patient Details  Name: Jeffrey Snow MRN: [id number] Date of Birth: [birth year]  Transition [facility] CM/SW Contact:  Emeterio Reeve, [place] Phone Number: 09/23/2020, 11:14 AM   Clinical Narrative:     PT will discharge to [facility] via ptar. Pt brother Jeffrey Snow was notified of discharge.   Nurse to call report to [phone].    Barriers to Discharge: Continued Medical Work up   Patient Goals and CMS Choice   CMS Medicare.gov Compare Post Acute Care list provided to:: Patient Represenative (must comment) (Brother ,Jeffrey Snow)    Discharge Placement                       Discharge Plan and Services                                     Social Determinants of Health (SDOH) Interventions     Readmission Risk Interventions No flowsheet data found.   Emeterio Reeve, Latanya Presser, [place] Social Worker [phone]

## 2020-09-25 ENCOUNTER — Other Ambulatory Visit: Payer: Self-pay

## 2020-09-25 ENCOUNTER — Emergency Department (HOSPITAL_COMMUNITY)
Admission: EM | Admit: 2020-09-25 | Discharge: 2020-09-26 | Disposition: A | Discharge status: Skilled Nursing Facility | Payer: Medicare Other | Attending: Emergency Medicine | Admitting: Emergency Medicine

## 2020-09-25 DIAGNOSIS — L290 Pruritus ani: Secondary | ICD-10-CM | POA: Diagnosis not present

## 2020-09-25 DIAGNOSIS — Z87891 Personal history of nicotine dependence: Secondary | ICD-10-CM | POA: Insufficient documentation

## 2020-09-25 DIAGNOSIS — I10 Essential (primary) hypertension: Secondary | ICD-10-CM | POA: Diagnosis not present

## 2020-09-25 DIAGNOSIS — Z79899 Other long term (current) drug therapy: Secondary | ICD-10-CM | POA: Diagnosis not present

## 2020-09-25 DIAGNOSIS — R21 Rash and other nonspecific skin eruption: Secondary | ICD-10-CM

## 2020-09-25 DIAGNOSIS — K625 Hemorrhage of anus and rectum: Secondary | ICD-10-CM | POA: Diagnosis present

## 2020-09-25 LAB — POC OCCULT BLOOD, ED: Fecal Occult Bld: POSITIVE — AB

## 2020-09-25 LAB — COMPREHENSIVE METABOLIC PANEL
ALT: 40 U/L (ref 0–44)
AST: 41 U/L (ref 15–41)
Albumin: 3.4 g/dL — ABNORMAL LOW (ref 3.5–5.0)
Alkaline Phosphatase: 94 U/L (ref 38–126)
Anion gap: 12 (ref 5–15)
BUN: 13 mg/dL (ref 8–23)
CO2: 21 mmol/L — ABNORMAL LOW (ref 22–32)
Calcium: 8.3 mg/dL — ABNORMAL LOW (ref 8.9–10.3)
Chloride: 103 mmol/L (ref 98–111)
Creatinine, Ser: 0.74 mg/dL (ref 0.61–1.24)
GFR, Estimated: >60 mL/min (ref >60)
Glucose, Bld: 75 mg/dL (ref 70–99)
Potassium: 3.5 mmol/L (ref 3.5–5.1)
Sodium: 136 mmol/L (ref 135–145)
Total Bilirubin: 4.2 mg/dL — ABNORMAL HIGH (ref 0.3–1.2)
Total Protein: 6.2 g/dL — ABNORMAL LOW (ref 6.5–8.1)

## 2020-09-25 LAB — CBC
HCT: 42.2 % (ref 39.0–52.0)
Hemoglobin: 14.4 g/dL (ref 13.0–17.0)
MCH: 32.4 pg (ref 26.0–34.0)
MCHC: 34.1 g/dL (ref 30.0–36.0)
MCV: 95 fL (ref 80.0–100.0)
Platelets: 36 10*3/uL — ABNORMAL LOW (ref 150–400)
RBC: 4.44 MIL/uL (ref 4.22–5.81)
RDW: 15.9 % — ABNORMAL HIGH (ref 11.5–15.5)
WBC: 4.6 10*3/uL (ref 4.0–10.5)
nRBC: 0 % (ref 0.0–0.2)

## 2020-09-25 LAB — TYPE AND SCREEN
ABO/RH(D): O POS
Antibody Screen: NEGATIVE

## 2020-09-25 LAB — PROTIME-INR
INR: 1.6 — ABNORMAL HIGH (ref 0.8–1.2)
Prothrombin Time: 18.1 seconds — ABNORMAL HIGH (ref 11.4–15.2)

## 2020-09-25 NOTE — ED Triage Notes (Signed)
Patient BIB EMS from [facility] c/o GI bleeding started 2days ago. Pt complain bright red blood in stool. Patient denies N/V/D. Pt a/ox4.  BP 170/67 HR 71 RR 18 O2sat 99% on RA

## 2020-09-25 NOTE — ED Notes (Signed)
[facility] called for transport.  

## 2020-09-25 NOTE — ED Provider Notes (Signed)
[facility] DEPT Provider Note   CSN: [id number] Arrival date & time: 09/25/20  1958     History Chief Complaint  Patient presents with  . Rectal Bleeding    Jeffrey Snow is a 64 y.o. male.  HPI Patient presents for evaluation of rectal bleeding that he states occurs every time he sits down on the commode.  He is currently in a rehab following recent hospitalization and discharge, 2 days ago.  And blood work today, which was notable for normal hemoglobin. Patient states he has seen blood in both urine and stool, over the last several days. He is a somewhat poor historian. There are no other known modifying factors.    Past Medical History:  Diagnosis Date  . Abnormal MRI of abdomen, liver  04/21/2014  . Anxiety   . Cirrhosis, alcoholic ([place]) 05/18/9527  . Claudication of left lower extremity (HCC) m-1  . Depression   . [place] (hepatocellular carcinoma) ([place])   . Hepatitis C   . Hypertension   . Liver mass   . Panic attack     Patient Active Problem List   Diagnosis Date Noted  . Traumatic rhabdomyolysis ([place]) 09/19/2020  . History of GI bleed 09/19/2020  . Rhabdomyolysis 09/19/2020  . Wheezing 11/28/2019  . Neuropathy 11/28/2019  . Anxiety 11/28/2019  . Acute upper GI bleed 09/05/2015  . Lactic acidosis 09/05/2015  . Hyperkalemia 09/05/2015  . Hemorrhagic shock ([place]) 09/05/2015  . Urinary tract infection, acute 08/31/2015  . Anemia, blood loss 12/16/2014  . Melena 12/16/2014  . Anemia 12/16/2014  . Abdominal pain, chronic, right upper quadrant 07/06/2014  . [place] (hepatocellular carcinoma) ([place]) 06/09/2014  . Other pancytopenia ([facility]) 04/27/2014  . Cellulitis of leg 04/24/2014  . Elevated troponin 04/23/2014  . Normocytic anemia 04/21/2014  . Cirrhosis, alcoholic ([place]) 41/32/4401  . Hyponatremia 04/21/2014  . Hypokalemia 04/21/2014  . Metabolic acidosis 02/72/5366  . Hepatitis C 04/21/2014  . Abnormal MRI of abdomen, liver   04/21/2014  . Leg pain 04/21/2014  . Rash and nonspecific skin eruption 04/21/2014  . T wave inversion in EKG 04/21/2014  . Right foot pain 03/31/2014  . Hyperglycemia 03/03/2014  . Vitamin D deficiency disease 03/03/2014  . HTN (hypertension) 03/03/2014  . Elevated LFTs 03/03/2014  . Screening for colon cancer 03/03/2014  . Annual physical exam 03/03/2014  . PVD (peripheral vascular disease) ([place]) 12/03/2013  . PAD (peripheral artery disease) ([place]) 10/26/2013  . Arterial insufficiency ([place]) 10/26/2013  . Fall 10/26/2013  . Depression 09/22/2013  . Insomnia 09/22/2013  . Claudication of left lower extremity ([place])   . Edema 08/20/2013  . Pain in limb 08/20/2013  . Atherosclerosis of native arteries of the extremities with intermittent claudication 08/20/2013  . Thrombocytopenia ([place]) 10/12/2012    Past Surgical History:  Procedure Laterality Date  . ABDOMINAL AORTAGRAM N/A 12/27/2013   Procedure: ABDOMINAL Maxcine Ham;  Surgeon: Conrad Okemah, MD;  Location: [facility] CATH LAB;  Service: Cardiovascular;  Laterality: N/A;  . AORTOGRAM    . COSMETIC SURGERY     facial reconstructive surgery  . ESOPHAGOGASTRODUODENOSCOPY N/A 12/17/2014   Procedure: ESOPHAGOGASTRODUODENOSCOPY (EGD);  Surgeon: Missy Sabins, MD;  Location: [facility];  Service: Endoscopy;  Laterality: N/A;  . ESOPHAGOGASTRODUODENOSCOPY N/A 09/05/2015   Procedure: ESOPHAGOGASTRODUODENOSCOPY (EGD);  Surgeon: Wonda Horner, MD;  Location: [facility];  Service: Endoscopy;  Laterality: N/A;  . IR ANGIOGRAM EXTREMITY RIGHT  03/21/2020  . IR ANGIOGRAM SELECTIVE EACH ADDITIONAL VESSEL  03/10/2020  .  IR ANGIOGRAM SELECTIVE EACH ADDITIONAL VESSEL  03/10/2020  . IR ANGIOGRAM SELECTIVE EACH ADDITIONAL VESSEL  03/10/2020  . IR ANGIOGRAM SELECTIVE EACH ADDITIONAL VESSEL  03/10/2020  . IR ANGIOGRAM SELECTIVE EACH ADDITIONAL VESSEL  03/10/2020  . IR ANGIOGRAM SELECTIVE EACH ADDITIONAL VESSEL  03/21/2020  . IR ANGIOGRAM SELECTIVE EACH ADDITIONAL  VESSEL  03/21/2020  . IR ANGIOGRAM SELECTIVE EACH ADDITIONAL VESSEL  03/21/2020  . IR ANGIOGRAM SELECTIVE EACH ADDITIONAL VESSEL  03/21/2020  . IR ANGIOGRAM SELECTIVE EACH ADDITIONAL VESSEL  03/21/2020  . IR ANGIOGRAM SELECTIVE EACH ADDITIONAL VESSEL  04/18/2020  . IR ANGIOGRAM SELECTIVE EACH ADDITIONAL VESSEL  04/18/2020  . IR ANGIOGRAM VISCERAL SELECTIVE  03/10/2020  . IR ANGIOGRAM VISCERAL SELECTIVE  03/21/2020  . IR ANGIOGRAM VISCERAL SELECTIVE  04/18/2020  . IR EMBO ARTERIAL NOT HEMORR HEMANG INC GUIDE ROADMAPPING  03/10/2020  . IR EMBO TUMOR ORGAN ISCHEMIA INFARCT INC GUIDE ROADMAPPING  03/21/2020  . IR EMBO TUMOR ORGAN ISCHEMIA INFARCT INC GUIDE ROADMAPPING  04/18/2020  . IR GENERIC HISTORICAL  01/19/2015   IR RADIOLOGIST EVAL & MGMT 01/19/2015 Jacqulynn Cadet, MD [facility] INTERV RAD  . IR GENERIC HISTORICAL  07/23/2016   IR RADIOLOGIST EVAL & MGMT 07/23/2016 Jacqulynn Cadet, MD [facility] INTERV RAD  . IR GENERIC HISTORICAL  05/30/2015   IR RADIOLOGIST EVAL & MGMT 05/30/2015 [facility] INTERV RAD  . IR GENERIC HISTORICAL  10/04/2014   IR RADIOLOGIST EVAL & MGMT 10/04/2014 Jacqulynn Cadet, MD [facility] INTERV RAD  . IR GENERIC HISTORICAL  02/11/2017   IR RADIOLOGIST EVAL & MGMT 02/11/2017 Jacqulynn Cadet, MD [facility] INTERV RAD  . IR RADIOLOGIST EVAL & MGMT  08/13/2017  . IR RADIOLOGIST EVAL & MGMT  08/11/2018  . IR RADIOLOGIST EVAL & MGMT  02/09/2019  . IR RADIOLOGIST EVAL & MGMT  02/10/2020  . IR RADIOLOGIST EVAL & MGMT  05/11/2020  . IR RADIOLOGIST EVAL & MGMT  08/09/2020  . IR US GUIDE VASC ACCESS LEFT  03/10/2020  . IR US GUIDE VASC ACCESS LEFT  03/21/2020  . IR US GUIDE VASC ACCESS LEFT  04/18/2020  . IR US GUIDE VASC ACCESS RIGHT  03/21/2020  . LOWER EXTREMITY ANGIOGRAM Bilateral 12/27/2013   Procedure: LOWER EXTREMITY ANGIOGRAM;  Surgeon: Conrad Omena, MD;  Location: [facility] C[facility] LAB;  Service: Cardiovascular;  Laterality: Bilateral;       Family History  Problem Relation Age of Onset  . Diabetes Father   . Diabetes Brother    . Diabetes Brother     Social History   Tobacco Use  . Smoking status: Former Smoker    Packs/day: 0.50    Quit date: 08/21/2011    Years since quitting: 9.1  . Smokeless tobacco: Never Used  Vaping Use  . Vaping Use: Never used  Substance Use Topics  . Alcohol use: Not on file    Comment: Occasional beer on the weekends  . Drug use: No    Types: Cocaine    Comment: abused cocaine in the 80's    Home Medications Prior to Admission medications   Medication Sig Start Date End Date Taking? Authorizing Provider  albuterol (VENTOLIN HFA) 108 (90 Base) MCG/ACT inhaler Inhale 2 puffs into the lungs every 6 (six) hours as needed for wheezing or shortness of breath. 07/04/20  Yes Azzie Glatter, FNP  ALPRAZolam Duanne Moron) 0.25 MG tablet Take 0.25 mg by mouth 2 (two) times daily as needed for anxiety.   Yes [provider]  bisacodyl (DULCOLAX) 5 MG EC  tablet Take 5 mg by mouth daily as needed for mild constipation or moderate constipation. Reported on 04/03/2016   Yes [provider]  FLUoxetine (PROZAC) 40 MG capsule Take 40 mg by mouth daily. 07/27/20  Yes [provider]  furosemide (LASIX) 20 MG tablet TAKE 1 TABLET BY MOUTH EVERY MORNING Patient taking differently: Take 20 mg by mouth daily.  11/26/19  Yes Azzie Glatter, FNP  gabapentin (NEURONTIN) 300 MG capsule Take 1 capsule (300 mg total) by mouth 3 (three) times daily. 06/06/20  Yes Azzie Glatter, FNP  hydrOXYzine (ATARAX/VISTARIL) 10 MG tablet Take 1 tablet (10 mg total) by mouth 3 (three) times daily as needed. Patient taking differently: Take 10 mg by mouth in the morning, at noon, and at bedtime.  11/26/19  Yes Azzie Glatter, FNP  pantoprazole (PROTONIX) 40 MG tablet TAKE 1 TABLET(40 MG) BY MOUTH TWICE DAILY BEFORE A MEAL Patient taking differently: Take 40 mg by mouth 2 (two) times daily.  11/26/19  Yes Azzie Glatter, FNP  spironolactone (ALDACTONE) 25 MG tablet TAKE 3 TABLETS(75 MG) BY  MOUTH DAILY Patient taking differently: Take 25 mg by mouth daily.  04/03/20  Yes Azzie Glatter, FNP  vitamin C (ASCORBIC ACID) 250 MG tablet Take 500 mg by mouth daily.    Yes [provider]    Allergies    Folic acid and Oxycodone  Review of Systems   Review of Systems  All other systems reviewed and are negative.   Physical Exam Updated Vital Signs BP (!) 153/69   Pulse 68   Temp 98.2 F (36.8 C) (Oral)   Resp 15   Ht 5\' 8"  (1.727 m)   Wt 82.6 kg   SpO2 97%   BMI 27.67 kg/m   Physical Exam Vitals and nursing note reviewed.  Constitutional:      General: He is not in acute distress.    Appearance: He is well-developed. He is not ill-appearing, toxic-appearing or diaphoretic.  HENT:     Head: Normocephalic and atraumatic.     Right Ear: External ear normal.     Left Ear: External ear normal.  Eyes:     Conjunctiva/sclera: Conjunctivae normal.     Pupils: Pupils are equal, round, and reactive to light.  Neck:     Trachea: Phonation normal.  Cardiovascular:     Rate and Rhythm: Normal rate and regular rhythm.     Heart sounds: Normal heart sounds.  Pulmonary:     Effort: Pulmonary effort is normal.     Breath sounds: Normal breath sounds.  Abdominal:     General: There is no distension.     Palpations: Abdomen is soft.     Tenderness: There is no abdominal tenderness.  Genitourinary:    Comments: Perianal rash consistent with nonspecific skin breakdown, with evidence for bright red blood, from several areas of this rash. Normal anus. Brown stool in rectal vault. No rectal mass. No visible or palpable hemorrhoids. No clear evidence for anal fissure. Musculoskeletal:        General: Normal range of motion.     Cervical back: Normal range of motion and neck supple.  Skin:    General: Skin is warm and dry.  Neurological:     Mental Status: He is alert and oriented to person, place, and time.     Cranial Nerves: No cranial nerve deficit.     Sensory:  No sensory deficit.     Motor: No abnormal  muscle tone.     Coordination: Coordination normal.  Psychiatric:        Mood and Affect: Mood normal.        Behavior: Behavior normal.        Thought Content: Thought content normal.        Judgment: Judgment normal.     ED Results / Procedures / Treatments   Labs (all labs ordered are listed, but only abnormal results are displayed) Labs Reviewed  COMPREHENSIVE METABOLIC PANEL - Abnormal; Notable for the following components:      Result Value   CO2 21 (*)    Calcium 8.3 (*)    Total Protein 6.2 (*)    Albumin 3.4 (*)    Total Bilirubin 4.2 (*)    All other components within normal limits  CBC - Abnormal; Notable for the following components:   RDW 15.9 (*)    Platelets 36 (*)    All other components within normal limits  PROTIME-INR - Abnormal; Notable for the following components:   Prothrombin Time 18.1 (*)    INR 1.6 (*)    All other components within normal limits  POC OCCULT BLOOD, ED - Abnormal; Notable for the following components:   Fecal Occult Bld POSITIVE (*)    All other components within normal limits  TYPE AND SCREEN    EKG None  Radiology No results found.  Procedures Procedures (including critical care time)  Medications Ordered in ED Medications - No data to display  ED Course  I have reviewed the triage vital signs and the nursing notes.  Pertinent labs & imaging results that were available during my care of the patient were reviewed by me and considered in my medical decision making (see chart for details).  Clinical Course as of Sep 26 2347  [facility] Sep 25, 2020  2303 Mild elevation  Protime-INR(!) [EW]  2304 Blood present, possibly contamination from external bleeding which was visualized.  POC occult blood, ED Provider will collect(!) [EW]  2304 Normal  CBC(!) [EW]  2304 Normal except CO2 low, calcium low, total protein low, albumin low, total bilirubin elevated  Comprehensive metabolic  panel(!) [EW]  2778 I was able to reach patient's daughter Randall Hiss on the telephone. She states that he has baseline confusion, which is stable today. I informed her of the findings and the plan. She states she understood and had no further questions.   [EW]    Clinical Course User Index [EW] Daleen Bo, MD   MDM Rules/Calculators/A&P                           Patient Vitals for the past 24 hrs:  BP Temp Temp src Pulse Resp SpO2 Height Weight  09/25/20 2330 (!) 153/69 -- -- 68 15 97 % -- --  09/25/20 2220 (!) 163/67 -- -- 66 18 99 % -- --  09/25/20 2200 (!) 163/67 -- -- 71 17 98 % -- --  09/25/20 2020 (!) 170/67 98.2 F (36.8 C) Oral 68 14 99 % -- --  09/25/20 2015 -- -- -- -- -- -- 5\' 8"  (1.727 m) 82.6 kg    11:04 PM Reevaluation with update and discussion. After initial assessment and treatment, an updated evaluation reveals no change in status, he remains confused, findings discussed with patient, I have also discussed this with his daughter by telephone. Daleen Bo   Medical Decision Making:  This patient is presenting for evaluation of  reported bleeding from rectum and urine, which does require a range of treatment options, and is a complaint that involves a moderate risk of morbidity and mortality. The differential diagnoses include upper or lower GI bleeding. I decided to review old records, and in summary patient recently discharged from hospital to rehab, following treatment for rhabdomyolysis. During the hospitalization he was noted to be confused, which was felt to be possibly from prior alcohol drinking..  I obtained additional historical information from family member by telephone.  Clinical Laboratory Tests Ordered, included CBC, Metabolic panel and INR, occult blood. Review indicates normal hemoglobin, mildly elevated INR, occult blood positive.   Critical Interventions-clinical evaluation, laboratory testing, observation reassessment  After These Interventions,  the Patient was reevaluated and was found stable for discharge. Patient with evident bleeding from perianal rash, which is being treated with barrier cream, appropriately. Occult blood test positive however I think that is from contamination from the perianal rash. I doubt intestinal bleeding. Note patient has normal hemoglobin. BUN also normal. CRITICAL CARE-no Performed by: Daleen Bo  Nursing Notes Reviewed/ Care Coordinated Applicable Imaging Reviewed Interpretation of Laboratory Data incorporated into ED treatment  The patient appears reasonably screened and/or stabilized for discharge and I doubt any other medical condition or other [facility] requiring further screening, evaluation, or treatment in the ED at this time prior to discharge.  Plan: Home Medications-continue usual; Home Treatments-barrier cream to perianal rash; return here if the recommended treatment, does not improve the symptoms; Recommended follow up-PCP follow-up as needed     Final Clinical Impression(s) / ED Diagnoses Final diagnoses:  Perianal rash    Rx / DC Orders ED Discharge Orders    None       Daleen Bo, MD 09/25/20 2348

## 2020-09-25 NOTE — Discharge Instructions (Addendum)
The bleeding you are having when you sit on the commode is from a rash around your anus. A facility where you are staying is applying barrier cream which is the treatment for this rash. Your blood count is normal and there is no risk to your health at this time from the rash. Continue the current treatments, which are in place at the rehab center.

## 2020-10-07 ENCOUNTER — Emergency Department (HOSPITAL_COMMUNITY): Payer: Medicare Other

## 2020-10-07 ENCOUNTER — Emergency Department (HOSPITAL_COMMUNITY)
Admission: EM | Admit: 2020-10-07 | Discharge: 2020-10-07 | Disposition: A | Discharge status: Rehabilitation Facility | Payer: Medicare Other | Attending: Emergency Medicine | Admitting: Emergency Medicine

## 2020-10-07 DIAGNOSIS — R0602 Shortness of breath: Secondary | ICD-10-CM | POA: Insufficient documentation

## 2020-10-07 DIAGNOSIS — R188 Other ascites: Secondary | ICD-10-CM

## 2020-10-07 DIAGNOSIS — Z87891 Personal history of nicotine dependence: Secondary | ICD-10-CM | POA: Diagnosis not present

## 2020-10-07 DIAGNOSIS — K7031 Alcoholic cirrhosis of liver with ascites: Secondary | ICD-10-CM

## 2020-10-07 DIAGNOSIS — I10 Essential (primary) hypertension: Secondary | ICD-10-CM | POA: Insufficient documentation

## 2020-10-07 DIAGNOSIS — D696 Thrombocytopenia, unspecified: Secondary | ICD-10-CM | POA: Diagnosis not present

## 2020-10-07 DIAGNOSIS — Z79899 Other long term (current) drug therapy: Secondary | ICD-10-CM | POA: Diagnosis not present

## 2020-10-07 DIAGNOSIS — I7090 Unspecified atherosclerosis: Secondary | ICD-10-CM

## 2020-10-07 LAB — LIPASE, BLOOD: Lipase: 38 U/L (ref 11–51)

## 2020-10-07 LAB — URINALYSIS, ROUTINE W REFLEX MICROSCOPIC
Bacteria, UA: NONE SEEN
Bilirubin Urine: NEGATIVE
Glucose, UA: NEGATIVE mg/dL
Hgb urine dipstick: NEGATIVE
Ketones, ur: NEGATIVE mg/dL
Leukocytes,Ua: NEGATIVE
Nitrite: NEGATIVE
Protein, ur: 30 mg/dL — AB
Specific Gravity, Urine: 1.025 (ref 1.005–1.030)
pH: 6 (ref 5.0–8.0)

## 2020-10-07 LAB — CBC WITH DIFFERENTIAL/PLATELET
Abs Immature Granulocytes: 0 10*3/uL (ref 0.00–0.07)
Basophils Absolute: 0 10*3/uL (ref 0.0–0.1)
Basophils Relative: 0 %
Eosinophils Absolute: 0 10*3/uL (ref 0.0–0.5)
Eosinophils Relative: 2 %
HCT: 40.7 % (ref 39.0–52.0)
Hemoglobin: 13.4 g/dL (ref 13.0–17.0)
Immature Granulocytes: 0 %
Lymphocytes Relative: 17 %
Lymphs Abs: 0.5 10*3/uL — ABNORMAL LOW (ref 0.7–4.0)
MCH: 32.7 pg (ref 26.0–34.0)
MCHC: 32.9 g/dL (ref 30.0–36.0)
MCV: 99.3 fL (ref 80.0–100.0)
Monocytes Absolute: 0.5 10*3/uL (ref 0.1–1.0)
Monocytes Relative: 17 %
Neutro Abs: 1.8 10*3/uL (ref 1.7–7.7)
Neutrophils Relative %: 64 %
Platelets: 37 10*3/uL — ABNORMAL LOW (ref 150–400)
RBC: 4.1 MIL/uL — ABNORMAL LOW (ref 4.22–5.81)
RDW: 17.7 % — ABNORMAL HIGH (ref 11.5–15.5)
WBC: 2.7 10*3/uL — ABNORMAL LOW (ref 4.0–10.5)
nRBC: 0 % (ref 0.0–0.2)

## 2020-10-07 LAB — COMPREHENSIVE METABOLIC PANEL
ALT: 31 U/L (ref 0–44)
AST: 33 U/L (ref 15–41)
Albumin: 3.3 g/dL — ABNORMAL LOW (ref 3.5–5.0)
Alkaline Phosphatase: 125 U/L (ref 38–126)
Anion gap: 8 (ref 5–15)
BUN: 11 mg/dL (ref 8–23)
CO2: 27 mmol/L (ref 22–32)
Calcium: 8.6 mg/dL — ABNORMAL LOW (ref 8.9–10.3)
Chloride: 103 mmol/L (ref 98–111)
Creatinine, Ser: 0.91 mg/dL (ref 0.61–1.24)
GFR, Estimated: >60 mL/min (ref >60)
Glucose, Bld: 127 mg/dL — ABNORMAL HIGH (ref 70–99)
Potassium: 3.8 mmol/L (ref 3.5–5.1)
Sodium: 138 mmol/L (ref 135–145)
Total Bilirubin: 3.6 mg/dL — ABNORMAL HIGH (ref 0.3–1.2)
Total Protein: 6 g/dL — ABNORMAL LOW (ref 6.5–8.1)

## 2020-10-07 LAB — BRAIN NATRIURETIC PEPTIDE: B Natriuretic Peptide: 51 pg/mL (ref 0.0–100.0)

## 2020-10-07 MED ORDER — FUROSEMIDE 20 MG PO TABS: 20.0000 mg | ORAL_TABLET | Freq: Every day | ORAL | 0 refills | Status: DC

## 2020-10-07 MED ORDER — SPIRONOLACTONE 25 MG PO TABS: 75.0000 mg | ORAL_TABLET | Freq: Every day | ORAL | 0 refills | Status: AC

## 2020-10-07 NOTE — ED Triage Notes (Signed)
Presents via EMS with c/o abd swelling. HX of hep c, liver CA. Pt has gone thru radiation, recently placed at [facility] but hasnt been getting his diuretics.

## 2020-10-07 NOTE — Discharge Instructions (Signed)
At this time there does not appear to be the presence of an emergent medical condition, however there is always the potential for conditions to change. Please read and follow the below instructions.  Please return to the Emergency Department immediately for any new or worsening symptoms. Please be sure to follow up with your Primary Care Provider within one week regarding your visit today; please call their office to schedule an appointment even if you are feeling better for a follow-up visit. The interventional radiology team with Dr. Vernard Gambles which should be reaching out to you or [facility] in the next few days to schedule an outpatient paracentesis for removal of fluid from the abdomen. If you do not receive a call from them in the next 2 days call their office to schedule an appointment. Your diuretic therapies have been represcribed, please follow-up with your primary care provider and other specialists this week for recheck and medication managements to see if any changes need to be made.  Go to the nearest Emergency Department immediately if: You have fever or chills You are confused. You have new or worsening breathing trouble. You have new or worsening pain in your abdomen. You have new or worsening swelling in the scrotum (in men). You have any new/concerning or worsening of symptoms  Please read the additional information packets attached to your discharge summary.  Do not take your medicine if  develop an itchy rash, swelling in your mouth or lips, or difficulty breathing; call 911 and seek immediate emergency medical attention if this occurs.  You may review your lab tests and imaging results in their entirety on your MyChart account.  Please discuss all results of fully with your primary care provider and other specialist at your follow-up visit.  Note: Portions of this text may have been transcribed using voice recognition software. Every effort was made to ensure accuracy;  however, inadvertent computerized transcription errors may still be present.

## 2020-10-07 NOTE — ED Notes (Signed)
[facility] has been called for transport. Attempted report, no one would answer in the [place].

## 2020-10-07 NOTE — ED Provider Notes (Signed)
[facility] DEPT Provider Note   CSN: [id number] Arrival date & time: 10/07/20  1035     History Chief Complaint  Patient presents with  . Ascites    Jeffrey Snow is a 64 y.o. male history of of cirrhosis, alcoholism, hepatocellular carcinoma, hepatitis C, hypertension.  Patient presents today from [place] for concern of excess fluid, he reports he has not been taking his diuretic medication since going to [place] 1 week ago.  He reports they have been giving him all of his medications except for his Lasix or spironolactone.  He reports some increasing abdominal swelling over the past week no associated abdominal pain, he reports that the swelling has now gone down to his legs and he will occasionally feel short of breath due to the swelling.  Denies fever/chills, fall/injury, chest pain, cough/hemoptysis, nausea/vomiting, diarrhea, dysuria/hematuria, numbness/weakness, tingling or any additional concerns. HPI     Past Medical History:  Diagnosis Date  . Abnormal MRI of abdomen, liver  04/21/2014  . Anxiety   . Cirrhosis, alcoholic ([place]) 05/22/8937  . Claudication of left lower extremity (HCC) m-1  . Depression   . [place] (hepatocellular carcinoma) ([place])   . Hepatitis C   . Hypertension   . Liver mass   . Panic attack     Patient Active Problem List   Diagnosis Date Noted  . Traumatic rhabdomyolysis ([place]) 09/19/2020  . History of GI bleed 09/19/2020  . Rhabdomyolysis 09/19/2020  . Wheezing 11/28/2019  . Neuropathy 11/28/2019  . Anxiety 11/28/2019  . Acute upper GI bleed 09/05/2015  . Lactic acidosis 09/05/2015  . Hyperkalemia 09/05/2015  . Hemorrhagic shock ([place]) 09/05/2015  . Urinary tract infection, acute 08/31/2015  . Anemia, blood loss 12/16/2014  . Melena 12/16/2014  . Anemia 12/16/2014  . Abdominal pain, chronic, right upper quadrant 07/06/2014  . [place] (hepatocellular carcinoma) ([place]) 06/09/2014  . Other  pancytopenia ([place]) 04/27/2014  . Cellulitis of leg 04/24/2014  . Elevated troponin 04/23/2014  . Normocytic anemia 04/21/2014  . Cirrhosis, alcoholic ([place]) 10/01/5101  . Hyponatremia 04/21/2014  . Hypokalemia 04/21/2014  . Metabolic acidosis 58/52/7782  . Hepatitis C 04/21/2014  . Abnormal MRI of abdomen, liver  04/21/2014  . Leg pain 04/21/2014  . Rash and nonspecific skin eruption 04/21/2014  . T wave inversion in EKG 04/21/2014  . Right foot pain 03/31/2014  . Hyperglycemia 03/03/2014  . Vitamin D deficiency disease 03/03/2014  . HTN (hypertension) 03/03/2014  . Elevated LFTs 03/03/2014  . Screening for colon cancer 03/03/2014  . Annual physical exam 03/03/2014  . PVD (peripheral vascular disease) ([place]) 12/03/2013  . PAD (peripheral artery disease) ([place]) 10/26/2013  . Arterial insufficiency ([place]) 10/26/2013  . Fall 10/26/2013  . Depression 09/22/2013  . Insomnia 09/22/2013  . Claudication of left lower extremity ([place])   . Edema 08/20/2013  . Pain in limb 08/20/2013  . Atherosclerosis of native arteries of the extremities with intermittent claudication 08/20/2013  . Thrombocytopenia ([place]) 10/12/2012    Past Surgical History:  Procedure Laterality Date  . ABDOMINAL AORTAGRAM N/A 12/27/2013   Procedure: ABDOMINAL Maxcine Ham;  Surgeon: Conrad [place], MD;  Location: [facility] CATH LAB;  Service: Cardiovascular;  Laterality: N/A;  . AORTOGRAM    . COSMETIC SURGERY     facial reconstructive surgery  . ESOPHAGOGASTRODUODENOSCOPY N/A 12/17/2014   Procedure: ESOPHAGOGASTRODUODENOSCOPY (EGD);  Surgeon: Missy Sabins, MD;  Location: [facility] ENDOSCOPY;  Service: Endoscopy;  Laterality: N/A;  . ESOPHAGOGASTRODUODENOSCOPY N/A 09/05/2015  Procedure: ESOPHAGOGASTRODUODENOSCOPY (EGD);  Surgeon: Wonda Horner, MD;  Location: [facility] ENDOSCOPY;  Service: Endoscopy;  Laterality: N/A;  . IR ANGIOGRAM EXTREMITY RIGHT  03/21/2020  . IR ANGIOGRAM SELECTIVE EACH ADDITIONAL VESSEL  03/10/2020  . IR ANGIOGRAM SELECTIVE  EACH ADDITIONAL VESSEL  03/10/2020  . IR ANGIOGRAM SELECTIVE EACH ADDITIONAL VESSEL  03/10/2020  . IR ANGIOGRAM SELECTIVE EACH ADDITIONAL VESSEL  03/10/2020  . IR ANGIOGRAM SELECTIVE EACH ADDITIONAL VESSEL  03/10/2020  . IR ANGIOGRAM SELECTIVE EACH ADDITIONAL VESSEL  03/21/2020  . IR ANGIOGRAM SELECTIVE EACH ADDITIONAL VESSEL  03/21/2020  . IR ANGIOGRAM SELECTIVE EACH ADDITIONAL VESSEL  03/21/2020  . IR ANGIOGRAM SELECTIVE EACH ADDITIONAL VESSEL  03/21/2020  . IR ANGIOGRAM SELECTIVE EACH ADDITIONAL VESSEL  03/21/2020  . IR ANGIOGRAM SELECTIVE EACH ADDITIONAL VESSEL  04/18/2020  . IR ANGIOGRAM SELECTIVE EACH ADDITIONAL VESSEL  04/18/2020  . IR ANGIOGRAM VISCERAL SELECTIVE  03/10/2020  . IR ANGIOGRAM VISCERAL SELECTIVE  03/21/2020  . IR ANGIOGRAM VISCERAL SELECTIVE  04/18/2020  . IR EMBO ARTERIAL NOT HEMORR HEMANG INC GUIDE ROADMAPPING  03/10/2020  . IR EMBO TUMOR ORGAN ISCHEMIA INFARCT INC GUIDE ROADMAPPING  03/21/2020  . IR EMBO TUMOR ORGAN ISCHEMIA INFARCT INC GUIDE ROADMAPPING  04/18/2020  . IR GENERIC HISTORICAL  01/19/2015   IR RADIOLOGIST EVAL & MGMT 01/19/2015 Jacqulynn Cadet, MD [facility] INTERV RAD  . IR GENERIC HISTORICAL  07/23/2016   IR RADIOLOGIST EVAL & MGMT 07/23/2016 Jacqulynn Cadet, MD [facility] INTERV RAD  . IR GENERIC HISTORICAL  05/30/2015   IR RADIOLOGIST EVAL & MGMT 05/30/2015 [facility] INTERV RAD  . IR GENERIC HISTORICAL  10/04/2014   IR RADIOLOGIST EVAL & MGMT 10/04/2014 Jacqulynn Cadet, MD [facility] INTERV RAD  . IR GENERIC HISTORICAL  02/11/2017   IR RADIOLOGIST EVAL & MGMT 02/11/2017 Jacqulynn Cadet, MD [facility] INTERV RAD  . IR RADIOLOGIST EVAL & MGMT  08/13/2017  . IR RADIOLOGIST EVAL & MGMT  08/11/2018  . IR RADIOLOGIST EVAL & MGMT  02/09/2019  . IR RADIOLOGIST EVAL & MGMT  02/10/2020  . IR RADIOLOGIST EVAL & MGMT  05/11/2020  . IR RADIOLOGIST EVAL & MGMT  08/09/2020  . IR US GUIDE VASC ACCESS LEFT  03/10/2020  . IR US GUIDE VASC ACCESS LEFT  03/21/2020  . IR US GUIDE VASC ACCESS LEFT  04/18/2020  . IR US GUIDE  VASC ACCESS RIGHT  03/21/2020  . LOWER EXTREMITY ANGIOGRAM Bilateral 12/27/2013   Procedure: LOWER EXTREMITY ANGIOGRAM;  Surgeon: Conrad [place], MD;  Location: [facility] CATH LAB;  Service: Cardiovascular;  Laterality: Bilateral;       Family History  Problem Relation Age of Onset  . Diabetes Father   . Diabetes Brother   . Diabetes Brother     Social History   Tobacco Use  . Smoking status: Former Smoker    Packs/day: 0.50    Quit date: 08/21/2011    Years since quitting: 9.1  . Smokeless tobacco: Never Used  Vaping Use  . Vaping Use: Never used  Substance Use Topics  . Alcohol use: Not on file    Comment: Occasional beer on the weekends  . Drug use: No    Types: Cocaine    Comment: abused cocaine in the 80's    Home Medications Prior to Admission medications   Medication Sig Start Date End Date Taking? Authorizing Provider  bisacodyl (DULCOLAX) 5 MG EC tablet Take 5 mg by mouth daily as needed for mild constipation or moderate constipation. Reported on 04/03/2016  Yes [provider]  FLUoxetine (PROZAC) 40 MG capsule Take 40 mg by mouth daily. 07/27/20  Yes [provider]  gabapentin (NEURONTIN) 300 MG capsule Take 1 capsule (300 mg total) by mouth 3 (three) times daily. 06/06/20  Yes Azzie Glatter, FNP  hydrOXYzine (ATARAX/VISTARIL) 10 MG tablet Take 1 tablet (10 mg total) by mouth 3 (three) times daily as needed. Patient taking differently: Take 10 mg by mouth in the morning, at noon, and at bedtime.  11/26/19  Yes Azzie Glatter, FNP  pantoprazole (PROTONIX) 40 MG tablet TAKE 1 TABLET(40 MG) BY MOUTH TWICE DAILY BEFORE A MEAL Patient taking differently: Take 40 mg by mouth 2 (two) times daily.  11/26/19  Yes Azzie Glatter, FNP  vitamin C (ASCORBIC ACID) 250 MG tablet Take 500 mg by mouth daily.    Yes [provider]  albuterol (VENTOLIN HFA) 108 (90 Base) MCG/ACT inhaler Inhale 2 puffs into the lungs every 6 (six) hours as needed for wheezing  or shortness of breath. 07/04/20   Azzie Glatter, FNP  ALPRAZolam Duanne Moron) 0.25 MG tablet Take 0.25 mg by mouth 2 (two) times daily as needed for anxiety.    [provider]  furosemide (LASIX) 20 MG tablet Take 1 tablet (20 mg total) by mouth daily. 10/07/20   Deliah Boston, PA-C  spironolactone (ALDACTONE) 25 MG tablet Take 3 tablets (75 mg total) by mouth daily. 10/07/20   Nuala Alpha A, PA-C    Allergies    Folic acid and Oxycodone  Review of Systems   Review of Systems Ten systems are reviewed and are negative for acute change except as noted in the HPI  Physical Exam Updated Vital Signs BP (!) 161/67   Pulse 68   Temp 97.6 F (36.4 C) (Oral)   Resp 15   Ht 5' 8" (1.727 m)   Wt 81.2 kg   SpO2 99%   BMI 27.22 kg/m   Physical Exam Constitutional:      General: He is not in acute distress.    Appearance: Normal appearance. He is well-developed. He is obese. He is not ill-appearing or diaphoretic.  HENT:     Head: Normocephalic and atraumatic.  Eyes:     General: Vision grossly intact. Gaze aligned appropriately.     Pupils: Pupils are equal, round, and reactive to light.  Neck:     Trachea: Trachea and phonation normal.  Pulmonary:     Effort: Pulmonary effort is normal. No respiratory distress.  Abdominal:     General: Abdomen is protuberant. There is no distension.     Palpations: Abdomen is soft.     Tenderness: There is no abdominal tenderness. There is no guarding or rebound.  Musculoskeletal:        General: Normal range of motion.     Cervical back: Normal range of motion.     Right lower leg: 1+ Edema present.     Left lower leg: 1+ Edema present.  Skin:    General: Skin is warm and dry.  Neurological:     Mental Status: He is alert.     GCS: GCS eye subscore is 4. GCS verbal subscore is 5. GCS motor subscore is 6.     Comments: Speech is clear and goal oriented, follows commands Major Cranial nerves without deficit, no facial  droop Moves extremities without ataxia, coordination intact  Psychiatric:        Behavior: Behavior normal.     ED  Results / Procedures / Treatments   Labs (all labs ordered are listed, but only abnormal results are displayed) Labs Reviewed  CBC WITH DIFFERENTIAL/PLATELET - Abnormal; Notable for the following components:      Result Value   WBC 2.7 (*)    RBC 4.10 (*)    RDW 17.7 (*)    Platelets 37 (*)    Lymphs Abs 0.5 (*)    All other components within normal limits  COMPREHENSIVE METABOLIC PANEL - Abnormal; Notable for the following components:   Glucose, Bld 127 (*)    Calcium 8.6 (*)    Total Protein 6.0 (*)    Albumin 3.3 (*)    Total Bilirubin 3.6 (*)    All other components within normal limits  URINALYSIS, ROUTINE W REFLEX MICROSCOPIC - Abnormal; Notable for the following components:   Color, Urine AMBER (*)    Protein, ur 30 (*)    All other components within normal limits  LIPASE, BLOOD  BRAIN NATRIURETIC PEPTIDE    EKG EKG Interpretation  Date/Time:  Saturday October 07 2020 12:13:23 EDT Ventricular Rate:  67 PR Interval:    QRS Duration: 90 QT Interval:  441 QTC Calculation: 466 R Axis:   -30 Text Interpretation: Sinus rhythm Left axis deviation Low voltage, precordial leads Nonspecific T abnormalities, anterior leads since last tracing no significant change Confirmed by Daleen Bo [phone]) on 10/07/2020 1:42:42 PM   Radiology DG Abdomen Acute W/Chest  Result Date: 10/07/2020 CLINICAL DATA:  Shortness of breath, ascites EXAM: DG ABDOMEN ACUTE WITH 1 VIEW CHEST COMPARISON:  08/07/2020 FINDINGS: There is no evidence of dilated bowel loops or free intraperitoneal air. Surgical coils within the central aspect of the upper abdomen. No radiopaque calculi or other significant radiographic abnormality is seen. Advanced aortoiliac atherosclerotic calcification. Heart size and mediastinal contours are within normal limits. Atherosclerotic calcification of the  aortic knob. Both lungs are clear. IMPRESSION: 1. Nonobstructive bowel gas pattern. 2. No acute cardiopulmonary findings. Electronically Signed   By: Davina Poke D.O.   On: 10/07/2020 12:24    Procedures Procedures (including critical care time)  Medications Ordered in ED Medications - No data to display  ED Course  I have reviewed the triage vital signs and the nursing notes.  Pertinent labs & imaging results that were available during my care of the patient were reviewed by me and considered in my medical decision making (see chart for details).  Clinical Course as of Oct 07 1536  Sat Oct 07, 2020  1357 Dr. Vernard Gambles   [BM]    Clinical Course User Index [BM] Gari Crown   MDM Rules/Calculators/A&P                          Additional history obtained from: 1. Nursing notes from this visit. 2. Medical record review. ------------------------ I ordered, reviewed and interpreted labs which include: BMP within normal limits, no evidence of heart failure Lipase normal limits, no evidence of pancreatitis CMP shows no emergent lecture light derangement or AKI, AST ALT and alk phos within normal limits.  Total bilirubin 3.6 appears similar to prior, no gap.   CBC shows baseline leukopenia at 2.7, no anemia, baseline thrombocytopenia. Urinalysis without evidence of infection.  DG Abd/Chest:  IMPRESSION:  1. Nonobstructive bowel gas pattern.  2. No acute cardiopulmonary findings.   EKG: Sinus rhythm Left axis deviation Low voltage, precordial leads Nonspecific T abnormalities, anterior leads since last tracing no  significant change Confirmed by Daleen Bo [phone]) on 10/07/2020 1:42:42 PM ----------------- Patient reevaluated resting comfortably no acute distress reports that he is feeling well no concerns or complaints at this time vital signs stable no hypoxia or tachycardia on room air.  Patient's abdomen is soft nontender doubt SBP or other emergent pathologies  at this time.  He has no shortness of breath currently and reports some occasional shortness of breath which likely secondary to ascites I spoke with Dr. Vernard Gambles with interventional radiology, Dr. Adron Bene team will follow up with patient and his facility to schedule outpatient paracentesis for symptomatic treatment.  Patient is agreeable to discharge with outpatient IR follow-up and refill of his diuretic therapies. - Patient's Lasix and spironolactone were represcribed as written on his discharge summary from hospitalization 10/5-10/08/2020.  At this time there does not appear to be any evidence of an acute emergency medical condition and the patient appears stable for discharge with appropriate outpatient follow up. Diagnosis was discussed with patient who verbalizes understanding of care plan and is agreeable to discharge. I have discussed return precautions with patient who verbalizes understanding. Patient encouraged to follow-up with their PCP and IR. All questions answered.  Patient seen and evaluated by Dr. Eulis Foster during this visit who agrees with plan of care.  Note: Portions of this report may have been transcribed using voice recognition software. Every effort was made to ensure accuracy; however, inadvertent computerized transcription errors may still be present. Final Clinical Impression(s) / ED Diagnoses Final diagnoses:  Other ascites  Thrombocytopenia ([place])  Atherosclerosis    Rx / DC Orders ED Discharge Orders         Ordered    spironolactone (ALDACTONE) 25 MG tablet  Daily        10/07/20 1513    furosemide (LASIX) 20 MG tablet  Daily        10/07/20 1513           Gari Crown 10/07/20 1538    Daleen Bo, MD 10/08/20 [phone]

## 2020-10-07 NOTE — ED Notes (Signed)
[facility] here for transport. 

## 2020-10-07 NOTE — ED Provider Notes (Signed)
  Face-to-face evaluation   History: Patient has history of bladder cancer and hepatitis, is presenting for evaluation of abdominal swelling.  He has chronic pain and is taking narcotics.  He is reportedly not taking diuretic medication.  Physical exam: Frail elderly male who is alert and cooperative.  No respiratory distress.  Abdomen is distended, but soft and nontender.  No dysarthria or aphasia.  Not overtly confused but he is a poor historian.  Medical screening examination/treatment/procedure(s) were conducted as a shared visit with non-physician practitioner(s) and myself.  I personally evaluated the patient during the encounter    Daleen Bo, MD 10/08/20 [phone]

## 2020-10-16 DIAGNOSIS — I739 Peripheral vascular disease, unspecified: Secondary | ICD-10-CM

## 2020-10-16 HISTORY — DX: Peripheral vascular disease, unspecified: I73.9

## 2020-10-19 ENCOUNTER — Other Ambulatory Visit: Payer: Self-pay | Admitting: Physician Assistant

## 2020-10-19 ENCOUNTER — Other Ambulatory Visit (HOSPITAL_COMMUNITY): Payer: Self-pay | Admitting: Physician Assistant

## 2020-10-19 DIAGNOSIS — K7031 Alcoholic cirrhosis of liver with ascites: Secondary | ICD-10-CM

## 2020-10-23 ENCOUNTER — Other Ambulatory Visit: Payer: Self-pay

## 2020-10-23 ENCOUNTER — Ambulatory Visit (HOSPITAL_COMMUNITY)
Admission: RE | Admit: 2020-10-23 | Discharge: 2020-10-23 | Disposition: A | Discharge status: Home / Self Care | Payer: Medicare Other | Source: Ambulatory Visit | Attending: Physician Assistant | Admitting: Physician Assistant

## 2020-10-23 DIAGNOSIS — K7031 Alcoholic cirrhosis of liver with ascites: Secondary | ICD-10-CM

## 2020-10-23 HISTORY — PX: IR PARACENTESIS: IMG2679

## 2020-10-23 LAB — BODY FLUID CELL COUNT WITH DIFFERENTIAL
Eos, Fluid: 0 %
Lymphs, Fluid: 40 %
Monocyte-Macrophage-Serous Fluid: 53 % (ref 50–90)
Neutrophil Count, Fluid: 7 % (ref 0–25)
Total Nucleated Cell Count, Fluid: 140 cu mm (ref 0–1000)

## 2020-10-23 LAB — ALBUMIN, PLEURAL OR PERITONEAL FLUID: Albumin, Fluid: <1 g/dL

## 2020-10-23 LAB — PROTEIN, PLEURAL OR PERITONEAL FLUID: Total protein, fluid: <3 g/dL

## 2020-10-23 MED ORDER — LIDOCAINE HCL 1 % IJ SOLN
INTRAMUSCULAR | Status: DC | PRN
  Administered 2020-10-23: 10 mL

## 2020-10-23 MED ORDER — LIDOCAINE HCL 1 % IJ SOLN
INTRAMUSCULAR | Status: AC
  Filled 2020-10-23: qty 20

## 2020-10-23 NOTE — Procedures (Signed)
PROCEDURE SUMMARY:  Successful US guided paracentesis from right lateral abdomen.  Yielded 2.9 liters of clear, yellow fluid.  No immediate complications.  Pt tolerated well.   Specimen was sent for labs.  EBL < 19mL  Docia Barrier PA-C 10/23/2020 9:26 AM

## 2020-10-24 LAB — PATHOLOGIST SMEAR REVIEW

## 2020-11-01 ENCOUNTER — Telehealth: Payer: Self-pay | Admitting: Hematology

## 2020-11-01 NOTE — Telephone Encounter (Signed)
Scheduled appt per 11/17 sch msg - left message for [facility] scheduler. Left message with apt date and time

## 2020-11-13 ENCOUNTER — Telehealth: Payer: Self-pay | Admitting: Family Medicine

## 2020-11-14 ENCOUNTER — Inpatient Hospital Stay: Payer: Medicare Other

## 2020-11-14 ENCOUNTER — Inpatient Hospital Stay: Payer: Medicare Other | Admitting: Hematology

## 2020-11-14 ENCOUNTER — Telehealth: Payer: Self-pay | Admitting: Hematology

## 2020-11-14 NOTE — Telephone Encounter (Signed)
Called pt per 11/30 sch msg - no answer. Left message for patient to call back to reschedule appt.

## 2020-11-14 NOTE — Telephone Encounter (Signed)
Done

## 2020-11-20 ENCOUNTER — Emergency Department (HOSPITAL_COMMUNITY): Payer: Medicare Other

## 2020-11-20 ENCOUNTER — Other Ambulatory Visit: Payer: Self-pay

## 2020-11-20 ENCOUNTER — Emergency Department (HOSPITAL_COMMUNITY)
Admission: EM | Admit: 2020-11-20 | Discharge: 2020-11-20 | Disposition: A | Discharge status: Home / Self Care | Payer: Medicare Other | Attending: Emergency Medicine | Admitting: Emergency Medicine

## 2020-11-20 DIAGNOSIS — Z79899 Other long term (current) drug therapy: Secondary | ICD-10-CM | POA: Insufficient documentation

## 2020-11-20 DIAGNOSIS — R1310 Dysphagia, unspecified: Secondary | ICD-10-CM

## 2020-11-20 DIAGNOSIS — Z87891 Personal history of nicotine dependence: Secondary | ICD-10-CM | POA: Insufficient documentation

## 2020-11-20 DIAGNOSIS — I10 Essential (primary) hypertension: Secondary | ICD-10-CM | POA: Insufficient documentation

## 2020-11-20 DIAGNOSIS — J029 Acute pharyngitis, unspecified: Secondary | ICD-10-CM | POA: Diagnosis present

## 2020-11-20 DIAGNOSIS — Z8505 Personal history of malignant neoplasm of liver: Secondary | ICD-10-CM | POA: Insufficient documentation

## 2020-11-20 LAB — CBC WITH DIFFERENTIAL/PLATELET
Abs Immature Granulocytes: 0.01 10*3/uL (ref 0.00–0.07)
Basophils Absolute: 0 10*3/uL (ref 0.0–0.1)
Basophils Relative: 0 %
Eosinophils Absolute: 0 10*3/uL (ref 0.0–0.5)
Eosinophils Relative: 1 %
HCT: 41.8 % (ref 39.0–52.0)
Hemoglobin: 13.9 g/dL (ref 13.0–17.0)
Immature Granulocytes: 0 %
Lymphocytes Relative: 15 %
Lymphs Abs: 0.7 10*3/uL (ref 0.7–4.0)
MCH: 33 pg (ref 26.0–34.0)
MCHC: 33.3 g/dL (ref 30.0–36.0)
MCV: 99.3 fL (ref 80.0–100.0)
Monocytes Absolute: 0.9 10*3/uL (ref 0.1–1.0)
Monocytes Relative: 18 %
Neutro Abs: 3.2 10*3/uL (ref 1.7–7.7)
Neutrophils Relative %: 66 %
Platelets: 62 10*3/uL — ABNORMAL LOW (ref 150–400)
RBC: 4.21 MIL/uL — ABNORMAL LOW (ref 4.22–5.81)
RDW: 14.2 % (ref 11.5–15.5)
WBC: 4.9 10*3/uL (ref 4.0–10.5)
nRBC: 0 % (ref 0.0–0.2)

## 2020-11-20 LAB — BASIC METABOLIC PANEL
Anion gap: 10 (ref 5–15)
BUN: 11 mg/dL (ref 8–23)
CO2: 25 mmol/L (ref 22–32)
Calcium: 8.8 mg/dL — ABNORMAL LOW (ref 8.9–10.3)
Chloride: 100 mmol/L (ref 98–111)
Creatinine, Ser: 0.71 mg/dL (ref 0.61–1.24)
GFR, Estimated: >60 mL/min (ref >60)
Glucose, Bld: 92 mg/dL (ref 70–99)
Potassium: 3.9 mmol/L (ref 3.5–5.1)
Sodium: 135 mmol/L (ref 135–145)

## 2020-11-20 LAB — GROUP A STREP BY PCR: Group A Strep by PCR: NOT DETECTED

## 2020-11-20 MED ORDER — AMOXICILLIN-POT CLAVULANATE 875-125 MG PO TABS: 1.0000 | ORAL_TABLET | Freq: Two times a day (BID) | ORAL | 0 refills | Status: DC

## 2020-11-20 MED ORDER — IOHEXOL 300 MG/ML  SOLN
75.0000 mL | Freq: Once | INTRAMUSCULAR | Status: AC | PRN
  Administered 2020-11-20: 75 mL via INTRAVENOUS

## 2020-11-20 MED ORDER — SODIUM CHLORIDE (PF) 0.9 % IJ SOLN
INTRAMUSCULAR | Status: AC
  Filled 2020-11-20: qty 50

## 2020-11-20 MED ORDER — PREDNISONE 10 MG (21) PO TBPK: ORAL_TABLET | ORAL | 0 refills | Status: DC

## 2020-11-20 NOTE — ED Provider Notes (Signed)
[place] DEPT Provider Note   CSN: [id number] Arrival date & time: 11/20/20  1103     History Chief Complaint  Patient presents with  . Sore Throat    Jeffrey Snow is a 64 y.o. male with past medical history of alcoholic cirrhosis, hepatocellular carcinoma, epilepsy, hypertension that presents emergency department today for sore throat.  Patient is currently being treated for liver cancer with radiation, no chemotherapy.  Followed by Dr. Burr Medico.  Patient states that he started having sore throat with difficulty swallowing 2 weeks ago, has also noticed a hoarse voice.  States that it is difficult to swallow food, however is able to swallow liquid okay.  States that it is more difficulty swallowing than  painful swallowing, however when it gets stuck it is painful.  States that this is never happened to him before.  Denies any other URI symptoms such as no cough, fevers, congestion, sinus pain.  Does admit to increased secretions.  No difficulty breathing or chest pain.  States that it has been worsening over the past 2 weeks.  Has been vaccinated with booster for Covid.  No other complaints. Is supposed to see Dr. Burr Medico soon for oncology follow up.   HPI     Past Medical History:  Diagnosis Date  . Abnormal MRI of abdomen, liver  04/21/2014  . Anxiety   . Cirrhosis, alcoholic ([place]) 0/04/3975  . Claudication of left lower extremity (HCC) m-1  . Depression   . [place] (hepatocellular carcinoma) ([place])   . Hepatitis C   . Hypertension   . Liver mass   . Panic attack     Patient Active Problem List   Diagnosis Date Noted  . Traumatic rhabdomyolysis ([place]) 09/19/2020  . History of GI bleed 09/19/2020  . Rhabdomyolysis 09/19/2020  . Wheezing 11/28/2019  . Neuropathy 11/28/2019  . Anxiety 11/28/2019  . Acute upper GI bleed 09/05/2015  . Lactic acidosis 09/05/2015  . Hyperkalemia 09/05/2015  . Hemorrhagic shock ([place]) 09/05/2015  . Urinary tract  infection, acute 08/31/2015  . Anemia, blood loss 12/16/2014  . Melena 12/16/2014  . Anemia 12/16/2014  . Abdominal pain, chronic, right upper quadrant 07/06/2014  . [place] (hepatocellular carcinoma) ([place]) 06/09/2014  . Other pancytopenia ([place]) 04/27/2014  . Cellulitis of leg 04/24/2014  . Elevated troponin 04/23/2014  . Normocytic anemia 04/21/2014  . Cirrhosis, alcoholic ([place]) 73/41/9379  . Hyponatremia 04/21/2014  . Hypokalemia 04/21/2014  . Metabolic acidosis 02/40/9735  . Hepatitis C 04/21/2014  . Abnormal MRI of abdomen, liver  04/21/2014  . Leg pain 04/21/2014  . Rash and nonspecific skin eruption 04/21/2014  . T wave inversion in EKG 04/21/2014  . Right foot pain 03/31/2014  . Hyperglycemia 03/03/2014  . Vitamin D deficiency disease 03/03/2014  . HTN (hypertension) 03/03/2014  . Elevated LFTs 03/03/2014  . Screening for colon cancer 03/03/2014  . Annual physical exam 03/03/2014  . PVD (peripheral vascular disease) ([place]) 12/03/2013  . PAD (peripheral artery disease) ([place]) 10/26/2013  . Arterial insufficiency ([place]) 10/26/2013  . Fall 10/26/2013  . Depression 09/22/2013  . Insomnia 09/22/2013  . Claudication of left lower extremity ([place])   . Edema 08/20/2013  . Pain in limb 08/20/2013  . Atherosclerosis of native arteries of the extremities with intermittent claudication 08/20/2013  . Thrombocytopenia ([place]) 10/12/2012    Past Surgical History:  Procedure Laterality Date  . ABDOMINAL AORTAGRAM N/A 12/27/2013   Procedure: ABDOMINAL Maxcine Ham;  Surgeon: Conrad [place], MD;  Location: [facility] CATH  LAB;  Service: Cardiovascular;  Laterality: N/A;  . AORTOGRAM    . COSMETIC SURGERY     facial reconstructive surgery  . ESOPHAGOGASTRODUODENOSCOPY N/A 12/17/2014   Procedure: ESOPHAGOGASTRODUODENOSCOPY (EGD);  Surgeon: Missy Sabins, MD;  Location: [facility] ENDOSCOPY;  Service: Endoscopy;  Laterality: N/A;  . ESOPHAGOGASTRODUODENOSCOPY N/A 09/05/2015   Procedure: ESOPHAGOGASTRODUODENOSCOPY  (EGD);  Surgeon: Wonda Horner, MD;  Location: [facility] ENDOSCOPY;  Service: Endoscopy;  Laterality: N/A;  . IR ANGIOGRAM EXTREMITY RIGHT  03/21/2020  . IR ANGIOGRAM SELECTIVE EACH ADDITIONAL VESSEL  03/10/2020  . IR ANGIOGRAM SELECTIVE EACH ADDITIONAL VESSEL  03/10/2020  . IR ANGIOGRAM SELECTIVE EACH ADDITIONAL VESSEL  03/10/2020  . IR ANGIOGRAM SELECTIVE EACH ADDITIONAL VESSEL  03/10/2020  . IR ANGIOGRAM SELECTIVE EACH ADDITIONAL VESSEL  03/10/2020  . IR ANGIOGRAM SELECTIVE EACH ADDITIONAL VESSEL  03/21/2020  . IR ANGIOGRAM SELECTIVE EACH ADDITIONAL VESSEL  03/21/2020  . IR ANGIOGRAM SELECTIVE EACH ADDITIONAL VESSEL  03/21/2020  . IR ANGIOGRAM SELECTIVE EACH ADDITIONAL VESSEL  03/21/2020  . IR ANGIOGRAM SELECTIVE EACH ADDITIONAL VESSEL  03/21/2020  . IR ANGIOGRAM SELECTIVE EACH ADDITIONAL VESSEL  04/18/2020  . IR ANGIOGRAM SELECTIVE EACH ADDITIONAL VESSEL  04/18/2020  . IR ANGIOGRAM VISCERAL SELECTIVE  03/10/2020  . IR ANGIOGRAM VISCERAL SELECTIVE  03/21/2020  . IR ANGIOGRAM VISCERAL SELECTIVE  04/18/2020  . IR EMBO ARTERIAL NOT HEMORR HEMANG INC GUIDE ROADMAPPING  03/10/2020  . IR EMBO TUMOR ORGAN ISCHEMIA INFARCT INC GUIDE ROADMAPPING  03/21/2020  . IR EMBO TUMOR ORGAN ISCHEMIA INFARCT INC GUIDE ROADMAPPING  04/18/2020  . IR GENERIC HISTORICAL  01/19/2015   IR RADIOLOGIST EVAL & MGMT 01/19/2015 Jacqulynn Cadet, MD [facility] INTERV RAD  . IR GENERIC HISTORICAL  07/23/2016   IR RADIOLOGIST EVAL & MGMT 07/23/2016 Jacqulynn Cadet, MD [facility] INTERV RAD  . IR GENERIC HISTORICAL  05/30/2015   IR RADIOLOGIST EVAL & MGMT 05/30/2015 [facility] INTERV RAD  . IR GENERIC HISTORICAL  10/04/2014   IR RADIOLOGIST EVAL & MGMT 10/04/2014 Jacqulynn Cadet, MD [facility] INTERV RAD  . IR GENERIC HISTORICAL  02/11/2017   IR RADIOLOGIST EVAL & MGMT 02/11/2017 Jacqulynn Cadet, MD [facility] INTERV RAD  . IR PARACENTESIS  10/23/2020  . IR RADIOLOGIST EVAL & MGMT  08/13/2017  . IR RADIOLOGIST EVAL & MGMT  08/11/2018  . IR RADIOLOGIST EVAL & MGMT  02/09/2019  . IR  RADIOLOGIST EVAL & MGMT  02/10/2020  . IR RADIOLOGIST EVAL & MGMT  05/11/2020  . IR RADIOLOGIST EVAL & MGMT  08/09/2020  . IR US GUIDE VASC ACCESS LEFT  03/10/2020  . IR US GUIDE VASC ACCESS LEFT  03/21/2020  . IR US GUIDE VASC ACCESS LEFT  04/18/2020  . IR US GUIDE VASC ACCESS RIGHT  03/21/2020  . LOWER EXTREMITY ANGIOGRAM Bilateral 12/27/2013   Procedure: LOWER EXTREMITY ANGIOGRAM;  Surgeon: Conrad [place], MD;  Location: [facility] CATH LAB;  Service: Cardiovascular;  Laterality: Bilateral;       Family History  Problem Relation Age of Onset  . Diabetes Father   . Diabetes Brother   . Diabetes Brother     Social History   Tobacco Use  . Smoking status: Former Smoker    Packs/day: 0.50    Quit date: 08/21/2011    Years since quitting: 9.2  . Smokeless tobacco: Never Used  Vaping Use  . Vaping Use: Never used  Substance Use Topics  . Alcohol use: Not on file    Comment: Occasional beer on the weekends  . Drug  use: No    Types: Cocaine    Comment: abused cocaine in the 80's    Home Medications Prior to Admission medications   Medication Sig Start Date End Date Taking? Authorizing Provider  albuterol (VENTOLIN HFA) 108 (90 Base) MCG/ACT inhaler Inhale 2 puffs into the lungs every 6 (six) hours as needed for wheezing or shortness of breath. 07/04/20   Azzie Glatter, FNP  ALPRAZolam Duanne Moron) 0.25 MG tablet Take 0.25 mg by mouth 2 (two) times daily as needed for anxiety.    [provider]  amoxicillin-clavulanate (AUGMENTIN) 875-125 MG tablet Take 1 tablet by mouth every 12 (twelve) hours. 11/20/20   Alfredia Client, PA-C  bisacodyl (DULCOLAX) 5 MG EC tablet Take 5 mg by mouth daily as needed for mild constipation or moderate constipation. Reported on 04/03/2016    [provider]  FLUoxetine (PROZAC) 40 MG capsule Take 40 mg by mouth daily. 07/27/20   [provider]  furosemide (LASIX) 20 MG tablet Take 1 tablet (20 mg total) by mouth daily. 10/07/20   Nuala Alpha  A, PA-C  gabapentin (NEURONTIN) 300 MG capsule Take 1 capsule (300 mg total) by mouth 3 (three) times daily. 06/06/20   Azzie Glatter, FNP  hydrOXYzine (ATARAX/VISTARIL) 10 MG tablet Take 1 tablet (10 mg total) by mouth 3 (three) times daily as needed. Patient taking differently: Take 10 mg by mouth in the morning, at noon, and at bedtime.  11/26/19   Azzie Glatter, FNP  pantoprazole (PROTONIX) 40 MG tablet TAKE 1 TABLET(40 MG) BY MOUTH TWICE DAILY BEFORE A MEAL Patient taking differently: Take 40 mg by mouth 2 (two) times daily.  11/26/19   Azzie Glatter, FNP  predniSONE (STERAPRED UNI-PAK 21 TAB) 10 MG (21) TBPK tablet Take as directed 11/20/20   Alfredia Client, PA-C  spironolactone (ALDACTONE) 25 MG tablet Take 3 tablets (75 mg total) by mouth daily. 10/07/20   Nuala Alpha A, PA-C  vitamin C (ASCORBIC ACID) 250 MG tablet Take 500 mg by mouth daily.     [provider]    Allergies    Folic acid and Oxycodone  Review of Systems   Review of Systems  Constitutional: Negative for chills, diaphoresis, fatigue and fever.  HENT: Positive for sore throat, trouble swallowing and voice change. Negative for congestion, dental problem, drooling, ear discharge, facial swelling, hearing loss and sinus pressure.   Eyes: Negative for pain and visual disturbance.  Respiratory: Negative for cough, shortness of breath and wheezing.   Cardiovascular: Negative for chest pain, palpitations and leg swelling.  Gastrointestinal: Negative for abdominal distention, abdominal pain, diarrhea, nausea and vomiting.  Genitourinary: Negative for difficulty urinating.  Musculoskeletal: Negative for back pain, neck pain and neck stiffness.  Skin: Negative for pallor.  Neurological: Negative for dizziness, speech difficulty, weakness and headaches.  Psychiatric/Behavioral: Negative for confusion.    Physical Exam Updated Vital Signs BP 133/65   Pulse 70   Temp 98.7 F (37.1 C) (Oral)   Resp  18   SpO2 97%   Physical Exam Constitutional:      General: He is not in acute distress.    Appearance: Normal appearance. He is not ill-appearing, toxic-appearing or diaphoretic.     Comments: Patient without acute respiratory stress.  Patient is sitting comfortably in bed, no tripoding, use of accessory muscles.  Patient is speaking to me in full sentences.  Handling secretions well.  HENT:     Head: Normocephalic and atraumatic.  Jaw: There is normal jaw occlusion. No trismus, swelling or malocclusion.     Nose: Congestion present. No rhinorrhea.     Right Sinus: No maxillary sinus tenderness or frontal sinus tenderness.     Left Sinus: No maxillary sinus tenderness or frontal sinus tenderness.     Mouth/Throat:     Mouth: Mucous membranes are moist. No oral lesions.     Dentition: Normal dentition.     Tongue: No lesions.     Palate: No mass and lesions.     Pharynx: Oropharynx is clear. Uvula midline. No pharyngeal swelling, oropharyngeal exudate, posterior oropharyngeal erythema or uvula swelling.     Tonsils: No tonsillar exudate or tonsillar abscesses. 1+ on the right. 1+ on the left.     Comments: Patient without tonsillar enlargement or exudate.  No signs of peritonsillar abscess, palate without any tenderness or masses palpated.  No swelling under the tongue, uvula is midline without any inflammation. Not wearing dentures. Eyes:     General: No visual field deficit or scleral icterus.       Right eye: No discharge.        Left eye: No discharge.     Extraocular Movements: Extraocular movements intact.     Conjunctiva/sclera: Conjunctivae normal.     Pupils: Pupils are equal, round, and reactive to light.  Cardiovascular:     Rate and Rhythm: Normal rate and regular rhythm.     Pulses: Normal pulses.     Heart sounds: Normal heart sounds. No murmur heard.  No friction rub. No gallop.   Pulmonary:     Effort: Pulmonary effort is normal. No respiratory distress.      Breath sounds: Normal breath sounds. No stridor. No wheezing, rhonchi or rales.  Chest:     Chest wall: No tenderness.  Abdominal:     General: Abdomen is flat. Bowel sounds are normal. There is no distension.     Palpations: Abdomen is soft.     Tenderness: There is no abdominal tenderness. There is no right CVA tenderness, left CVA tenderness, guarding or rebound.  Musculoskeletal:        General: No swelling or tenderness. Normal range of motion.     Cervical back: Normal range of motion and neck supple. No rigidity or tenderness.     Right lower leg: No edema.     Left lower leg: No edema.  Lymphadenopathy:     Cervical: No cervical adenopathy.  Skin:    General: Skin is warm and dry.     Capillary Refill: Capillary refill takes less than 2 seconds.     Coloration: Skin is not pale.     Findings: No erythema or rash.  Neurological:     General: No focal deficit present.     Mental Status: He is alert and oriented to person, place, and time.     Cranial Nerves: Cranial nerves are intact. No cranial nerve deficit or facial asymmetry.     Motor: Motor function is intact. No weakness.     Coordination: Coordination is intact.     Gait: Gait is intact. Gait normal.  Psychiatric:        Mood and Affect: Mood normal.        Behavior: Behavior normal.     ED Results / Procedures / Treatments   Labs (all labs ordered are listed, but only abnormal results are displayed) Labs Reviewed  CBC WITH DIFFERENTIAL/PLATELET - Abnormal; Notable for the following components:  Result Value   RBC 4.21 (*)    Platelets 62 (*)    All other components within normal limits  BASIC METABOLIC PANEL - Abnormal; Notable for the following components:   Calcium 8.8 (*)    All other components within normal limits  GROUP A STREP BY PCR    EKG None  Radiology CT Soft Tissue Neck W Contrast  Result Date: 11/20/2020 CLINICAL DATA:  Sore throat EXAM: CT NECK WITH CONTRAST TECHNIQUE:  Multidetector CT imaging of the neck was performed using the standard protocol following the bolus administration of intravenous contrast. CONTRAST:  10mL OMNIPAQUE IOHEXOL 300 MG/ML  SOLN COMPARISON:  None. FINDINGS: Poor contrast bolus. Pharynx and larynx: There is soft tissue thickening along the lingual surface of the epiglottis extending into the pre-epiglottic fat to the anterior commissure. There is some irregularity of the adjacent thyroid cartilage bilaterally. Salivary glands: Unremarkable. Thyroid: Normal. Lymph nodes: No enlarged or abnormal density nodes. Vascular: Calcified plaque at the common carotid bifurcations. Poor vessel opacification. Limited intracranial: No acute abnormality. Visualized orbits: Unremarkable. Mastoids and visualized paranasal sinuses: Minor mucosal thickening. Visualized mastoid air cells are clear. Skeleton: Mild degenerative changes of the cervical spine. Upper chest: Emphysema. Other: None. IMPRESSION: Poor contrast bolus. Soft tissue thickening along the lingual surface of the epiglottis extending into the preepiglottic fat to the anterior commissure. Mass is suspected and direct visual inspection is recommended. Irregularity of the adjacent thyroid cartilage bilaterally may be incidental or reflect involvement. Electronically Signed   By: Macy Mis M.D.   On: 11/20/2020 16:50    Procedures Procedures (including critical care time)  Medications Ordered in ED Medications  sodium chloride (PF) 0.9 % injection (has no administration in time range)  iohexol (OMNIPAQUE) 300 MG/ML solution 75 mL (75 mLs Intravenous Contrast Given 11/20/20 1625)    ED Course  I have reviewed the triage vital signs and the nursing notes.  Pertinent labs & imaging results that were available during my care of the patient were reviewed by me and considered in my medical decision making (see chart for details).    MDM Rules/Calculators/A&P                          JAMARIAN JACINTO is a 64 y.o. male with past medical history of alcoholic cirrhosis, hepatocellular carcinoma, epilepsy, hypertension that presents emergency department today for sore throat.  Physical exam with no tonsillar enlargement or exudate or masses palpated, however I am concerned for structural disease at this time due to increased secretions, hoarse voice and known liver cancer.  Do not think this is a viral process, patient states that he only has painful swallowing when he feels like there is something stuck in his throat, no other URI symptoms.  Will order basic labs and CT neck at this time.  I was able to watch patient drink water without any difficulty, however patient states that when he eats he has trouble.  Will reassess after CT scan.   CT scan does show soft tissue thickening on the lingual surface of the epiglottis, concerning for mass at this time.  Unable to visually see this when looking at the back of throat, will consult ENT for further recommendation.  Upon reassessment patient is handling secretions, no acute distress.  Spoke to Dr. Lucia Gaskins, ENT who recommends antibiotics and steroid taper and did make office appointment for this Wednesday.  Discussed this with patient, patient to be discharged  at this time.Doubt need for further emergent work up at this time. I explained the diagnosis and have given explicit precautions to return to the ER including for any other new or worsening symptoms. The patient understands and accepts the medical plan as it's been dictated and I have answered their questions. Discharge instructions concerning home care and prescriptions have been given. The patient is STABLE and is discharged to home in good condition.  I discussed this case with my attending physician who cosigned this note including patient's presenting symptoms, physical exam, and planned diagnostics and interventions. Attending physician stated agreement with plan or made changes to plan  which were implemented.   Attending physician assessed patient at bedside.   Final Clinical Impression(s) / ED Diagnoses Final diagnoses:  Dysphagia, unspecified type    Rx / DC Orders ED Discharge Orders         Ordered    amoxicillin-clavulanate (AUGMENTIN) 875-125 MG tablet  Every 12 hours        11/20/20 1734    predniSONE (STERAPRED UNI-PAK 21 TAB) 10 MG (21) TBPK tablet        11/20/20 1734           Alfredia Client, PA-C 11/20/20 1843    Maudie Flakes, MD 11/21/20 [phone]

## 2020-11-20 NOTE — ED Triage Notes (Signed)
Patient reports sore throat that he is unsure if it is his tonsils or something else. Patient reports he is being treated for liver cancer as well but feels this is not related. Patient denies cough, denies chest pain or SOB.

## 2020-11-20 NOTE — Discharge Instructions (Addendum)
Please follow up with Dr. Lucia Gaskins, ENT, you are scheduled for an appointment with them on Wednesday at 12:00, their information and address is provided above.  I want to also take the antibiotics and t the steroids as directed.  Please stick with soft foods, use the attachments.  Please come back to the emergency department for any new or worsening concerning symptoms.  I also want you to follow-up with your oncologist Dr. Annamaria Boots.  Speak to your pharmacist about any new medications prescribed today in regards to side effects and interactions.

## 2020-11-22 ENCOUNTER — Ambulatory Visit (INDEPENDENT_AMBULATORY_CARE_PROVIDER_SITE_OTHER): Payer: Self-pay | Admitting: Otolaryngology

## 2020-11-22 ENCOUNTER — Telehealth (INDEPENDENT_AMBULATORY_CARE_PROVIDER_SITE_OTHER): Payer: Medicare Other | Admitting: Family Medicine

## 2020-11-22 ENCOUNTER — Ambulatory Visit (INDEPENDENT_AMBULATORY_CARE_PROVIDER_SITE_OTHER): Payer: Medicare Other | Admitting: Otolaryngology

## 2020-11-22 ENCOUNTER — Other Ambulatory Visit: Payer: Self-pay

## 2020-11-22 ENCOUNTER — Encounter (INDEPENDENT_AMBULATORY_CARE_PROVIDER_SITE_OTHER): Payer: Self-pay | Admitting: Otolaryngology

## 2020-11-22 ENCOUNTER — Encounter: Payer: Self-pay | Admitting: Family Medicine

## 2020-11-22 VITALS — Temp 97.0°F

## 2020-11-22 DIAGNOSIS — R490 Dysphonia: Secondary | ICD-10-CM

## 2020-11-22 DIAGNOSIS — C220 Liver cell carcinoma: Secondary | ICD-10-CM

## 2020-11-22 DIAGNOSIS — I10 Essential (primary) hypertension: Secondary | ICD-10-CM

## 2020-11-22 DIAGNOSIS — J392 Other diseases of pharynx: Secondary | ICD-10-CM

## 2020-11-22 DIAGNOSIS — Z789 Other specified health status: Secondary | ICD-10-CM

## 2020-11-22 DIAGNOSIS — Z09 Encounter for follow-up examination after completed treatment for conditions other than malignant neoplasm: Secondary | ICD-10-CM

## 2020-11-22 DIAGNOSIS — R1907 Generalized intra-abdominal and pelvic swelling, mass and lump: Secondary | ICD-10-CM

## 2020-11-22 DIAGNOSIS — J029 Acute pharyngitis, unspecified: Secondary | ICD-10-CM | POA: Diagnosis not present

## 2020-11-22 DIAGNOSIS — F419 Anxiety disorder, unspecified: Secondary | ICD-10-CM

## 2020-11-22 DIAGNOSIS — K7031 Alcoholic cirrhosis of liver with ascites: Secondary | ICD-10-CM

## 2020-11-22 MED ORDER — FUROSEMIDE 20 MG PO TABS: 20.0000 mg | ORAL_TABLET | Freq: Every day | ORAL | 3 refills | Status: AC

## 2020-11-22 MED ORDER — PANTOPRAZOLE SODIUM 40 MG PO TBEC: DELAYED_RELEASE_TABLET | ORAL | 3 refills | Status: AC

## 2020-11-22 MED ORDER — PREDNISONE 10 MG PO TABS: ORAL_TABLET | ORAL | 0 refills | Status: DC

## 2020-11-22 MED ORDER — AMOXICILLIN-POT CLAVULANATE 875-125 MG PO TABS: 1.0000 | ORAL_TABLET | Freq: Two times a day (BID) | ORAL | 0 refills | Status: AC

## 2020-11-22 NOTE — Progress Notes (Signed)
Virtual Visit via Telephone Note  I connected with Jeffrey Snow on 11/22/20 at 11:15 AM EST by telephone and verified that I am speaking with the correct person using two identifiers.  Location: Patient: Home Provider: Office   I discussed the limitations, risks, security and privacy concerns of performing an evaluation and management service by telephone and the availability of in person appointments. I also discussed with the patient that there may be a patient responsible charge related to this service. The patient expressed understanding and agreed to proceed.   History of Present Illness:  Patient Active Problem List   Diagnosis Date Noted  . Traumatic rhabdomyolysis ([place]) 09/19/2020  . History of GI bleed 09/19/2020  . Rhabdomyolysis 09/19/2020  . Wheezing 11/28/2019  . Neuropathy 11/28/2019  . Anxiety 11/28/2019  . Acute upper GI bleed 09/05/2015  . Lactic acidosis 09/05/2015  . Hyperkalemia 09/05/2015  . Hemorrhagic shock ([place]) 09/05/2015  . Urinary tract infection, acute 08/31/2015  . Anemia, blood loss 12/16/2014  . Melena 12/16/2014  . Anemia 12/16/2014  . Abdominal pain, chronic, right upper quadrant 07/06/2014  . [place] (hepatocellular carcinoma) ([place]) 06/09/2014  . Other pancytopenia ([place]) 04/27/2014  . Cellulitis of leg 04/24/2014  . Elevated troponin 04/23/2014  . Normocytic anemia 04/21/2014  . Cirrhosis, alcoholic ([place]) 09/73/5329  . Hyponatremia 04/21/2014  . Hypokalemia 04/21/2014  . Metabolic acidosis 92/42/6834  . Hepatitis C 04/21/2014  . Abnormal MRI of abdomen, liver  04/21/2014  . Leg pain 04/21/2014  . Rash and nonspecific skin eruption 04/21/2014  . T wave inversion in EKG 04/21/2014  . Right foot pain 03/31/2014  . Hyperglycemia 03/03/2014  . Vitamin D deficiency disease 03/03/2014  . HTN (hypertension) 03/03/2014  . Elevated LFTs 03/03/2014  . Screening for colon cancer 03/03/2014  . Annual physical exam 03/03/2014  . PVD (peripheral  vascular disease) ([place]) 12/03/2013  . PAD (peripheral artery disease) ([place]) 10/26/2013  . Arterial insufficiency ([place]) 10/26/2013  . Fall 10/26/2013  . Depression 09/22/2013  . Insomnia 09/22/2013  . Claudication of left lower extremity ([place])   . Edema 08/20/2013  . Pain in limb 08/20/2013  . Atherosclerosis of native arteries of the extremities with intermittent claudication 08/20/2013  . Thrombocytopenia ([place]) 10/12/2012    Current Status: Since his last office visit, he has had an ED visit for Dysphagia and Sore Throat on 11/20/2020, which possible mass was seen on CT. He is referred to Dr. Lucia Gaskins, ENT for further evaluation. Also during 10/2020 he was a resident in [facility] for rehab X 1 month, where he was recently discharged. He was also referred to ENT  is doing well with no complaints. His anxiety is increased today r/t his health condition. He is requesting Home Health assistance. He is requesting refills of Alprazolam. He has not followed up with Psychiatrist lately. He denies fevers, chills, fatigue, recent infections, weight loss, and night sweats. He has not had any headaches, visual changes, dizziness, and falls. No chest pain, heart palpitations, cough and shortness of breath reported. Denies GI problems such as nausea, vomiting, diarrhea, and constipation. He has no reports of blood in stools, dysuria and hematuria. No depression or anxiety, and denies suicidal ideations, homicidal ideations, or auditory hallucinations. He is taking all medications as prescribed.   Observations/Objective:  Telephone Visit   Assessment and Plan:  1. Hospital discharge follow-up  2. [place] (hepatocellular carcinoma) ([place])  3. Patient unable to tolerate rehabilitation Patient recently discharged from [facility].  4. Throat mass Future biopsy 11/29/2020, per Dr. Lucia Gaskins, ENT.   5. Alcoholic cirrhosis of liver with ascites (HCC) - furosemide (LASIX) 20 MG tablet; Take 1  tablet (20 mg total) by mouth daily.  Dispense: 90 tablet; Refill: 3  6. Abdominal swelling, generalized  7. Essential hypertension He will continue to take medications as prescribed, to decrease high sodium intake, excessive alcohol intake, increase potassium intake, smoking cessation, and increase physical activity of at least 30 minutes of cardio activity daily. He will continue to follow Heart Healthy or DASH diet.  8. Anxiety Stable.   9. Follow up He will follow up in 1 month.   Meds ordered this encounter  Medications  . pantoprazole (PROTONIX) 40 MG tablet    Sig: TAKE 1 TABLET(40 MG) BY MOUTH TWICE DAILY BEFORE A MEAL    Dispense:  180 tablet    Refill:  3  . amoxicillin-clavulanate (AUGMENTIN) 875-125 MG tablet    Sig: Take 1 tablet by mouth every 12 (twelve) hours for 10 days.    Dispense:  20 tablet    Refill:  0  . predniSONE (DELTASONE) 10 MG tablet    Sig: Day #1: Take 6 tablets by mouth Day #2: Take 5 tablets by mouth Day #3: Take 4 tablets by mouth Day #4: Take 3 tablets by mouth Day #5: Take 2 tablets by mouth Day #6: Take 1 tablet, then complete.    Dispense:  21 tablet    Refill:  0  . furosemide (LASIX) 20 MG tablet    Sig: Take 1 tablet (20 mg total) by mouth daily.    Dispense:  90 tablet    Refill:  3   No orders of the defined types were placed in this encounter.   Referral Orders  No referral(s) requested today    Kathe Becton, MSN, ANE, FNP-BC [facility] [place] [address] [place], [place] [address] [phone]- fax  I discussed the assessment and treatment plan with the patient. The patient was provided an opportunity to ask questions and all were answered. The patient agreed with the plan and demonstrated an understanding of the instructions.   The patient was advised to call back or seek an in-person evaluation if the symptoms worsen or  if the condition fails to improve as anticipated.  I provided 20 minutes of non-face-to-face time during this encounter.   Azzie Glatter, FNP

## 2020-11-22 NOTE — H&P (Signed)
PREOPERATIVE H&P  Chief Complaint: Hoarseness and sore throat.  HPI: Jeffrey Snow is a 64 y.o. male who presents for evaluation of hoarseness and sore throat.  He was initially seen at [facility], ED and had a CT scan that showed a preepiglottic mass which was concerning for neoplasm.  He was treated with antibiotics and steroids and is doing much better concerning his hoarseness and sore throat since taking the medication.  However on fiberoptic laryngoscopy in the office patient had a ulcer or sore within the vallecula area and some abnormality on the laryngeal surface of epiglottis.  Clinically this is suspicious for malignancy. Patient has a history of hepatic cancer and is followed by Dr. Conception Oms  Past Medical History:  Diagnosis Date  . Abnormal MRI of abdomen, liver  04/21/2014  . Anxiety   . Cirrhosis, alcoholic ([place]) 02/13/5399  . Claudication of left lower extremity ([place]) 10/2020  . Depression   . [place] (hepatocellular carcinoma) ([place])   . Hearing deficit   . Hepatitis C   . Hypertension   . Liver mass   . Panic attack    Past Surgical History:  Procedure Laterality Date  . ABDOMINAL AORTAGRAM N/A 12/27/2013   Procedure: ABDOMINAL Maxcine Ham;  Surgeon: Conrad Breckenridge, MD;  Location: [facility] CATH LAB;  Service: Cardiovascular;  Laterality: N/A;  . AORTOGRAM    . COSMETIC SURGERY     facial reconstructive surgery  . ESOPHAGOGASTRODUODENOSCOPY N/A 12/17/2014   Procedure: ESOPHAGOGASTRODUODENOSCOPY (EGD);  Surgeon: Missy Sabins, MD;  Location: [facility] ENDOSCOPY;  Service: Endoscopy;  Laterality: N/A;  . ESOPHAGOGASTRODUODENOSCOPY N/A 09/05/2015   Procedure: ESOPHAGOGASTRODUODENOSCOPY (EGD);  Surgeon: Wonda Horner, MD;  Location: [facility] ENDOSCOPY;  Service: Endoscopy;  Laterality: N/A;  . IR ANGIOGRAM EXTREMITY RIGHT  03/21/2020  . IR ANGIOGRAM SELECTIVE EACH ADDITIONAL VESSEL  03/10/2020  . IR ANGIOGRAM SELECTIVE EACH ADDITIONAL VESSEL  03/10/2020  . IR ANGIOGRAM SELECTIVE EACH ADDITIONAL VESSEL   03/10/2020  . IR ANGIOGRAM SELECTIVE EACH ADDITIONAL VESSEL  03/10/2020  . IR ANGIOGRAM SELECTIVE EACH ADDITIONAL VESSEL  03/10/2020  . IR ANGIOGRAM SELECTIVE EACH ADDITIONAL VESSEL  03/21/2020  . IR ANGIOGRAM SELECTIVE EACH ADDITIONAL VESSEL  03/21/2020  . IR ANGIOGRAM SELECTIVE EACH ADDITIONAL VESSEL  03/21/2020  . IR ANGIOGRAM SELECTIVE EACH ADDITIONAL VESSEL  03/21/2020  . IR ANGIOGRAM SELECTIVE EACH ADDITIONAL VESSEL  03/21/2020  . IR ANGIOGRAM SELECTIVE EACH ADDITIONAL VESSEL  04/18/2020  . IR ANGIOGRAM SELECTIVE EACH ADDITIONAL VESSEL  04/18/2020  . IR ANGIOGRAM VISCERAL SELECTIVE  03/10/2020  . IR ANGIOGRAM VISCERAL SELECTIVE  03/21/2020  . IR ANGIOGRAM VISCERAL SELECTIVE  04/18/2020  . IR EMBO ARTERIAL NOT HEMORR HEMANG INC GUIDE ROADMAPPING  03/10/2020  . IR EMBO TUMOR ORGAN ISCHEMIA INFARCT INC GUIDE ROADMAPPING  03/21/2020  . IR EMBO TUMOR ORGAN ISCHEMIA INFARCT INC GUIDE ROADMAPPING  04/18/2020  . IR GENERIC HISTORICAL  01/19/2015   IR RADIOLOGIST EVAL & MGMT 01/19/2015 Jacqulynn Cadet, MD [facility] INTERV RAD  . IR GENERIC HISTORICAL  07/23/2016   IR RADIOLOGIST EVAL & MGMT 07/23/2016 Jacqulynn Cadet, MD [facility] INTERV RAD  . IR GENERIC HISTORICAL  05/30/2015   IR RADIOLOGIST EVAL & MGMT 05/30/2015 [facility] INTERV RAD  . IR GENERIC HISTORICAL  10/04/2014   IR RADIOLOGIST EVAL & MGMT 10/04/2014 Jacqulynn Cadet, MD [facility] INTERV RAD  . IR GENERIC HISTORICAL  02/11/2017   IR RADIOLOGIST EVAL & MGMT 02/11/2017 Jacqulynn Cadet, MD [facility] INTERV RAD  . IR PARACENTESIS  10/23/2020  . IR  RADIOLOGIST EVAL & MGMT  08/13/2017  . IR RADIOLOGIST EVAL & MGMT  08/11/2018  . IR RADIOLOGIST EVAL & MGMT  02/09/2019  . IR RADIOLOGIST EVAL & MGMT  02/10/2020  . IR RADIOLOGIST EVAL & MGMT  05/11/2020  . IR RADIOLOGIST EVAL & MGMT  08/09/2020  . IR US GUIDE VASC ACCESS LEFT  03/10/2020  . IR US GUIDE VASC ACCESS LEFT  03/21/2020  . IR US GUIDE VASC ACCESS LEFT  04/18/2020  . IR US GUIDE VASC ACCESS RIGHT  03/21/2020  . LOWER EXTREMITY  ANGIOGRAM Bilateral 12/27/2013   Procedure: LOWER EXTREMITY ANGIOGRAM;  Surgeon: Conrad Avondale, MD;  Location: [facility] CATH LAB;  Service: Cardiovascular;  Laterality: Bilateral;   Social History   Socioeconomic History  . Marital status: Single    Spouse name: Not on file  . Number of children: 3  . Years of education: Not on file  . Highest education level: Not on file  Occupational History    Employer: [employer]  Tobacco Use  . Smoking status: Former Smoker    Packs/day: 0.50    Years: 35.00    Pack years: 17.50    Quit date: 08/21/2011    Years since quitting: 9.2  . Smokeless tobacco: Never Used  Vaping Use  . Vaping Use: Never used  Substance and Sexual Activity  . Alcohol use: Not on file    Comment: Occasional beer on the weekends  . Drug use: No    Types: Cocaine    Comment: abused cocaine in the 80's  . Sexual activity: Not Currently  Other Topics Concern  . Not on file  Social History Narrative   Single, never married.  Lives alone.  3 daughters, 16 grandchildren who are not very involved in the patient's care.  Quit smoking and heavy drinking 3.5 years ago.    Social Determinants of Health   Financial Resource Strain: High Risk  . Difficulty of Paying Living Expenses: Hard  Food Insecurity: No Food Insecurity  . Worried About Charity fundraiser in the Last Year: Never true  . Ran Out of Food in the Last Year: Never true  Transportation Needs: No Transportation Needs  . Lack of Transportation (Medical): No  . Lack of Transportation (Non-Medical): No  Physical Activity: Inactive  . Days of Exercise per Week: 0 days  . Minutes of Exercise per Session: 0 min  Stress: Stress Concern Present  . Feeling of Stress : Very much  Social Connections:   . Frequency of Communication with Friends and Family: Not on file  . Frequency of Social Gatherings with Friends and Family: Not on file  . Attends Religious Services: Not on file  . Active Member of Clubs or  Organizations: Not on file  . Attends Archivist Meetings: Not on file  . Marital Status: Not on file   Family History  Problem Relation Age of Onset  . Diabetes Father   . Diabetes Brother   . Diabetes Brother    Allergies  Allergen Reactions  . Folic Acid Other (See Comments)    Makes stomach feel funny  . Oxycodone Itching and Other (See Comments)    Cause insomnia   Prior to Admission medications   Medication Sig Start Date End Date Taking? Authorizing Provider  albuterol (VENTOLIN HFA) 108 (90 Base) MCG/ACT inhaler Inhale 2 puffs into the lungs every 6 (six) hours as needed for wheezing or shortness of breath. 07/04/20   Azzie Glatter,  FNP  ALPRAZolam (XANAX) 0.25 MG tablet Take 0.25 mg by mouth 2 (two) times daily as needed for anxiety. Patient not taking: Reported on 11/22/2020    [provider]  amoxicillin-clavulanate (AUGMENTIN) 875-125 MG tablet Take 1 tablet by mouth every 12 (twelve) hours for 10 days. 11/22/20 12/02/20  Azzie Glatter, FNP  FLUoxetine (PROZAC) 40 MG capsule Take 40 mg by mouth daily. 07/27/20   [provider]  furosemide (LASIX) 20 MG tablet Take 1 tablet (20 mg total) by mouth daily. 11/22/20   Azzie Glatter, FNP  gabapentin (NEURONTIN) 300 MG capsule Take 1 capsule (300 mg total) by mouth 3 (three) times daily. 06/06/20   Azzie Glatter, FNP  hydrOXYzine (ATARAX/VISTARIL) 10 MG tablet Take 1 tablet (10 mg total) by mouth 3 (three) times daily as needed. Patient taking differently: Take 10 mg by mouth in the morning, at noon, and at bedtime.  11/26/19   Azzie Glatter, FNP  pantoprazole (PROTONIX) 40 MG tablet TAKE 1 TABLET(40 MG) BY MOUTH TWICE DAILY BEFORE A MEAL 11/22/20   Azzie Glatter, FNP  predniSONE (DELTASONE) 10 MG tablet Day #1: Take 6 tablets by mouth Day #2: Take 5 tablets by mouth Day #3: Take 4 tablets by mouth Day #4: Take 3 tablets by mouth Day #5: Take 2 tablets by mouth Day #6: Take 1  tablet, then complete. 11/22/20   Azzie Glatter, FNP  spironolactone (ALDACTONE) 25 MG tablet Take 3 tablets (75 mg total) by mouth daily. 10/07/20   Nuala Alpha A, PA-C     Positive ROS: Otherwise negative  All other systems have been reviewed and were otherwise negative with the exception of those mentioned in the HPI and as above.  Physical Exam: There were no vitals filed for this visit.  General: Alert, no acute distress Oral: Normal oral mucosa and tonsils Fiberoptic laryngoscopy revealed a lesion or mass within the vallecular area.  On manual palpation of this this area is a little bit firm but can barely reach it with my finger. Nasal: Clear nasal passages Neck: No palpable adenopathy or thyroid nodules. Ear: Ear canal is clear with normal appearing TMs Cardiovascular: Regular rate and rhythm, no murmur.  Respiratory: Clear to auscultation Neurologic: Alert and oriented x 3   Assessment/Plan: Preepiglottic mass with history of hoarseness and sore throat  Plan for direct laryngoscopy and biopsy   [name], MD 11/22/2020 4:09 PM

## 2020-11-22 NOTE — Progress Notes (Addendum)
HPI: Jeffrey Snow is a 64 y.o. male who presents is referred by [name] for evaluation of abnormality noted on recent CT scan because of a sore throat.  He was seen in the ED because of a sore throat he had for about 2 weeks.  He has history of liver cancer and is followed by Dr Conception Oms at the cancer center.[facility]  He was hoarse and could not swallow well but since being seen in the ED and treated with antibiotics and steroids he has done much better.  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  Direct visualization was recommended. Patient quit smoking and drinking about 10 years ago.  He presents today with his daughter [name].  He has difficulty with hearing.  Past Medical History:  Diagnosis Date  . Abnormal MRI of abdomen, liver  04/21/2014  . Anxiety   . Cirrhosis, alcoholic ([place]) 12/18/2438  . Claudication of left lower extremity (HCC) m-1  . Depression   . [place] (hepatocellular carcinoma) ([place])   . Hepatitis C   . Hypertension   . Liver mass   . Panic attack    Past Surgical History:  Procedure Laterality Date  . ABDOMINAL AORTAGRAM N/A 12/27/2013   Procedure: ABDOMINAL Maxcine Ham;  Surgeon: Conrad Whitehouse, MD;  Location: [facility] CATH LAB;  Service: Cardiovascular;  Laterality: N/A;  . AORTOGRAM    . COSMETIC SURGERY     facial reconstructive surgery  . ESOPHAGOGASTRODUODENOSCOPY N/A 12/17/2014   Procedure: ESOPHAGOGASTRODUODENOSCOPY (EGD);  Surgeon: Missy Sabins, MD;  Location: [facility] ENDOSCOPY;  Service: Endoscopy;  Laterality: N/A;  . ESOPHAGOGASTRODUODENOSCOPY N/A 09/05/2015   Procedure: ESOPHAGOGASTRODUODENOSCOPY (EGD);  Surgeon: Wonda Horner, MD;  Location: [facility] ENDOSCOPY;  Service: Endoscopy;  Laterality: N/A;  . IR ANGIOGRAM EXTREMITY RIGHT  03/21/2020  . IR ANGIOGRAM SELECTIVE EACH ADDITIONAL VESSEL  03/10/2020  . IR ANGIOGRAM SELECTIVE EACH ADDITIONAL VESSEL  03/10/2020  . IR ANGIOGRAM SELECTIVE EACH ADDITIONAL VESSEL   03/10/2020  . IR ANGIOGRAM SELECTIVE EACH ADDITIONAL VESSEL  03/10/2020  . IR ANGIOGRAM SELECTIVE EACH ADDITIONAL VESSEL  03/10/2020  . IR ANGIOGRAM SELECTIVE EACH ADDITIONAL VESSEL  03/21/2020  . IR ANGIOGRAM SELECTIVE EACH ADDITIONAL VESSEL  03/21/2020  . IR ANGIOGRAM SELECTIVE EACH ADDITIONAL VESSEL  03/21/2020  . IR ANGIOGRAM SELECTIVE EACH ADDITIONAL VESSEL  03/21/2020  . IR ANGIOGRAM SELECTIVE EACH ADDITIONAL VESSEL  03/21/2020  . IR ANGIOGRAM SELECTIVE EACH ADDITIONAL VESSEL  04/18/2020  . IR ANGIOGRAM SELECTIVE EACH ADDITIONAL VESSEL  04/18/2020  . IR ANGIOGRAM VISCERAL SELECTIVE  03/10/2020  . IR ANGIOGRAM VISCERAL SELECTIVE  03/21/2020  . IR ANGIOGRAM VISCERAL SELECTIVE  04/18/2020  . IR EMBO ARTERIAL NOT HEMORR HEMANG INC GUIDE ROADMAPPING  03/10/2020  . IR EMBO TUMOR ORGAN ISCHEMIA INFARCT INC GUIDE ROADMAPPING  03/21/2020  . IR EMBO TUMOR ORGAN ISCHEMIA INFARCT INC GUIDE ROADMAPPING  04/18/2020  . IR GENERIC HISTORICAL  01/19/2015   IR RADIOLOGIST EVAL & MGMT 01/19/2015 Jacqulynn Cadet, MD [facility] INTERV RAD  . IR GENERIC HISTORICAL  07/23/2016   IR RADIOLOGIST EVAL & MGMT 07/23/2016 Jacqulynn Cadet, MD [facility] INTERV RAD  . IR GENERIC HISTORICAL  05/30/2015   IR RADIOLOGIST EVAL & MGMT 05/30/2015 [facility] INTERV RAD  . IR GENERIC HISTORICAL  10/04/2014   IR RADIOLOGIST EVAL & MGMT 10/04/2014 Jacqulynn Cadet, MD [facility] INTERV RAD  . IR GENERIC HISTORICAL  02/11/2017   IR RADIOLOGIST EVAL & MGMT 02/11/2017 Jacqulynn Cadet,  MD [facility] INTERV RAD  . IR PARACENTESIS  10/23/2020  . IR RADIOLOGIST EVAL & MGMT  08/13/2017  . IR RADIOLOGIST EVAL & MGMT  08/11/2018  . IR RADIOLOGIST EVAL & MGMT  02/09/2019  . IR RADIOLOGIST EVAL & MGMT  02/10/2020  . IR RADIOLOGIST EVAL & MGMT  05/11/2020  . IR RADIOLOGIST EVAL & MGMT  08/09/2020  . IR US GUIDE VASC ACCESS LEFT  03/10/2020  . IR US GUIDE VASC ACCESS LEFT  03/21/2020  . IR US GUIDE VASC ACCESS LEFT  04/18/2020  . IR US GUIDE VASC ACCESS RIGHT  03/21/2020  . LOWER EXTREMITY  ANGIOGRAM Bilateral 12/27/2013   Procedure: LOWER EXTREMITY ANGIOGRAM;  Surgeon: Conrad Menifee, MD;  Location[facility] CATH LAB;  Service: Cardiovascular;  Laterality: Bilateral;   Social History   Socioeconomic History  . Marital status: Single    Spouse name: Not on file  . Number of children: 3  . Years of education: Not on file  . Highest education level: Not on file  Occupational History    Employer: [employer]  Tobacco Use  . Smoking status: Former Smoker    Packs/day: 0.50    Years: 35.00    Pack years: 17.50    Quit date: 08/21/2011    Years since quitting: 9.2  . Smokeless tobacco: Never Used  Vaping Use  . Vaping Use: Never used  Substance and Sexual Activity  . Alcohol use: Not on file    Comment: Occasional beer on the weekends  . Drug use: No    Types: Cocaine    Comment: abused cocaine in the 80's  . Sexual activity: Not Currently  Other Topics Concern  . Not on file  Social History Narrative   Single, never married.  Lives alone.  3 daughters, 16 grandchildren who are not very involved in the patient's care.  Quit smoking and heavy drinking 3.5 years ago.    Social Determinants of Health   Financial Resource Strain: High Risk  . Difficulty of Paying Living Expenses: Hard  Food Insecurity: No Food Insecurity  . Worried About Charity fundraiser in the Last Year: Never true  . Ran Out of Food in the Last Year: Never true  Transportation Needs: No Transportation Needs  . Lack of Transportation (Medical): No  . Lack of Transportation (Non-Medical): No  Physical Activity: Inactive  . Days of Exercise per Week: 0 days  . Minutes of Exercise per Session: 0 min  Stress: Stress Concern Present  . Feeling of Stress : Very much  Social Connections:   . Frequency of Communication with Friends and Family: Not on file  . Frequency of Social Gatherings with Friends and Family: Not on file  . Attends Religious Services: Not on file  . Active Member of Clubs or  Organizations: Not on file  . Attends Archivist Meetings: Not on file  . Marital Status: Not on file   Family History  Problem Relation Age of Onset  . Diabetes Father   . Diabetes Brother   . Diabetes Brother    Allergies  Allergen Reactions  . Folic Acid Other (See Comments)    Makes stomach feel funny  . Oxycodone Itching and Other (See Comments)    Cause insomnia   Prior to Admission medications   Medication Sig Start Date End Date Taking? Authorizing Provider  albuterol (VENTOLIN HFA) 108 (90 Base) MCG/ACT inhaler Inhale 2 puffs into the lungs every 6 (six) hours as  needed for wheezing or shortness of breath. 07/04/20  Yes Azzie Glatter, FNP  ALPRAZolam Duanne Moron) 0.25 MG tablet Take 0.25 mg by mouth 2 (two) times daily as needed for anxiety.   Yes [provider]  amoxicillin-clavulanate (AUGMENTIN) 875-125 MG tablet Take 1 tablet by mouth every 12 (twelve) hours. 11/20/20  Yes Patel, Shalyn, PA-C  bisacodyl (DULCOLAX) 5 MG EC tablet Take 5 mg by mouth daily as needed for mild constipation or moderate constipation. Reported on 04/03/2016   Yes [provider]  FLUoxetine (PROZAC) 40 MG capsule Take 40 mg by mouth daily. 07/27/20  Yes [provider]  furosemide (LASIX) 20 MG tablet Take 1 tablet (20 mg total) by mouth daily. 10/07/20  Yes Nuala Alpha A, PA-C  gabapentin (NEURONTIN) 300 MG capsule Take 1 capsule (300 mg total) by mouth 3 (three) times daily. 06/06/20  Yes Azzie Glatter, FNP  hydrOXYzine (ATARAX/VISTARIL) 10 MG tablet Take 1 tablet (10 mg total) by mouth 3 (three) times daily as needed. Patient taking differently: Take 10 mg by mouth in the morning, at noon, and at bedtime.  11/26/19  Yes Azzie Glatter, FNP  pantoprazole (PROTONIX) 40 MG tablet TAKE 1 TABLET(40 MG) BY MOUTH TWICE DAILY BEFORE A MEAL Patient taking differently: Take 40 mg by mouth 2 (two) times daily.  11/26/19  Yes Azzie Glatter, FNP  predniSONE  (STERAPRED UNI-PAK 21 TAB) 10 MG (21) TBPK tablet Take as directed 11/20/20  Yes Alfredia Client, PA-C  spironolactone (ALDACTONE) 25 MG tablet Take 3 tablets (75 mg total) by mouth daily. 10/07/20  Yes Nuala Alpha A, PA-C  vitamin C (ASCORBIC ACID) 250 MG tablet Take 500 mg by mouth daily.    Yes [provider]     Positive ROS: Otherwise negative  All other systems have been reviewed and were otherwise negative with the exception of those mentioned in the HPI and as above.  Physical Exam: Constitutional: Alert, well-appearing, no acute distress.  Patient has no hoarseness today.  Minimal sore throat.  No airway problems. Ears: External ears without lesions or tenderness. Ear canals are clear bilaterally with intact, clear TMs.  He cannot hear well from either ear. Nasal: External nose without lesions. Septum slightly deviated to the left. Clear nasal passages otherwise. Oral: Lips and gums without lesions. Tongue and palate mucosa without lesions. Posterior oropharynx clear.  Indirect laryngoscopy was difficult because of strong gag reflex.  Manual palpation of the base of tongue reveals a firm mass at the very deep base of tongue in the region of the vallecula. Fiberoptic laryngoscopy was performed to the right nostril.  The nasopharynx was clear.  Patient has a lesion what appears to be more on the right side of the vallecula.  He also has some abnormality of the epiglottis posteriorly.  The vocal cords were clear with normal vocal cord mobility. Neck: No palpable adenopathy or masses.  I do not appreciate any significant adenopathy in the neck. Respiratory: Breathing comfortably  Skin: No facial/neck lesions or rash noted.  Laryngoscopy  Date/Time: 11/22/2020 12:43 PM Performed by: Rozetta Nunnery, MD Authorized by: Rozetta Nunnery, MD   Consent:    Consent obtained:  Verbal   Consent given by:  Patient Procedure details:    Indications: direct visualization  of the upper aerodigestive tract and hoarseness, dysphagia, or aspiration     Medication:  Afrin   Instrument: flexible fiberoptic laryngoscope   Sinus:    Right nasopharynx: normal  Mouth:    Vallecula: normal     lesion     Epiglottis: normal     lesion   Throat:    True vocal cords: normal   Comments:     On fiberoptic laryngoscopy patient has a lesion within the vallecula on the right side of the vallecula as well as some lesions along the vocal cord surface of the epiglottis.    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  Plan: We will plan on scheduling him direct laryngoscopy and biopsy next week.   Radene Journey, MD   CC:

## 2020-11-22 NOTE — Progress Notes (Signed)
Patient's chart reviewed with Dr Fransisco Beau, due to extensive hx alcoholic cirrhosis with ascites and recent admission for rhabdo, he will need to be done at main OR. Pam at Dr [facility] office made aware

## 2020-11-23 ENCOUNTER — Other Ambulatory Visit: Payer: Self-pay | Admitting: Family Medicine

## 2020-11-27 ENCOUNTER — Other Ambulatory Visit (HOSPITAL_COMMUNITY)
Admission: RE | Admit: 2020-11-27 | Discharge: 2020-11-27 | Disposition: A | Discharge status: Home / Self Care | Payer: Medicare Other | Source: Ambulatory Visit | Attending: Otolaryngology | Admitting: Otolaryngology

## 2020-11-27 DIAGNOSIS — Z20822 Contact with and (suspected) exposure to covid-19: Secondary | ICD-10-CM | POA: Insufficient documentation

## 2020-11-27 DIAGNOSIS — Z01812 Encounter for preprocedural laboratory examination: Secondary | ICD-10-CM | POA: Insufficient documentation

## 2020-11-28 ENCOUNTER — Telehealth: Payer: Self-pay | Admitting: Family Medicine

## 2020-11-28 ENCOUNTER — Encounter (HOSPITAL_COMMUNITY): Payer: Self-pay | Admitting: Physician Assistant

## 2020-11-28 DIAGNOSIS — Z01812 Encounter for preprocedural laboratory examination: Secondary | ICD-10-CM | POA: Diagnosis not present

## 2020-11-28 LAB — SARS CORONAVIRUS 2 (TAT 6-24 HRS): SARS Coronavirus 2: NEGATIVE

## 2020-11-28 NOTE — Progress Notes (Addendum)
I asked Karoline Caldwell, PA-C to review Jeffrey Snow chart.  I called Jeffrey Snow and did not get him, except once, Jeffrey Snow answer and grunted to the question, " Are you Jeffrey Snow. I said that I know that it is difficult for him to speak, is there anyone there I could speak with, the phone call  was disconnected. I called again and left instructions on voice mail.    I instructed the Jeffrey Snow to arrive at [name] entrance at 1200  , register in the [place]. DO NOT eat anything after midnight. I informed Jeffrey Snow that he may drink clear liquids until 1130. I informed Jeffrey Snow of what Clear liquids are.Carbonated Beverages, Water, Clear Tea, Black Coffee only, Juice (non-citric and without pulp), Gatorade, Plain Jell-O  only, Plain Popsicles only.     I instructed the Jeffrey Snow to take the following medications in the am with just enough water to get them down:  Prozac, Gabapentin, Pantoprazole., prn Albuterol inhaler- and asked Jeffrey Snow to bring the inhaler with him. I instructed Jeffrey Snow to shower with antibacteria soap, dry off with a clean towel , do not wear any lotions, powders, cologne, jewelry, piercing, make-up or nail polish.  Wear clean clothes. Brush teeth. I informed Jeffrey Snow that there will need to be a driver and someone to stay with him/her for the first 24 hours after surgery.   I instructed  Jeffrey Snow to call [phone], in the am if there were any questions or problems.

## 2020-11-28 NOTE — Telephone Encounter (Signed)
Patient is requesting provider re-fax forms to [facility] because they only received one page of the most recent fax.

## 2020-11-29 ENCOUNTER — Telehealth: Payer: Self-pay

## 2020-11-29 ENCOUNTER — Ambulatory Visit (HOSPITAL_COMMUNITY): Admission: RE | Admit: 2020-11-29 | Payer: Medicare Other | Source: Home / Self Care | Admitting: Otolaryngology

## 2020-11-29 ENCOUNTER — Telehealth (INDEPENDENT_AMBULATORY_CARE_PROVIDER_SITE_OTHER): Payer: Self-pay

## 2020-11-29 SURGERY — LARYNGOSCOPY, DIRECT
Anesthesia: General

## 2020-11-29 NOTE — Progress Notes (Signed)
[place]   [phone] [phone]   Clinic Follow up Note   Patient Care Team: Jeffrey Glatter, FNP as PCP - General (Family Medicine) Jeffrey Merle, MD as Consulting Physician (Hematology) Jeffrey Peaches, MD as Consulting Physician (Interventional Radiology) Jeffrey Merle, MD as Consulting Physician (Hematology) 11/30/2020  CHIEF COMPLAINT: Follow-up recurrent [place]  SUMMARY OF ONCOLOGIC HISTORY: Oncology History Overview Note  Cancer Staging No matching staging information was found for the patient.    [place] (hepatocellular carcinoma) ([place])  04/25/2014 Imaging   MRI Abdomen 04/25/14 - DIAGNOSED IMPRESSION: 3.2 cm enhancing lesion in the posterior segment right hepatic lobe, increased, highly suspicious for hepatocellular carcinoma.   Cirrhosis with splenomegaly and small volume abdominal ascites. Portal vein is patent.   Cholelithiasis with suspected secondary gallbladder wall thickening/edema. If there is clinical concern for cholecystitis, consider hepatobiliary nuclear medicine scan.   06/09/2014 Initial Diagnosis   HCC (hepatocellular carcinoma) ([place])   06/09/2014 Procedure   He was treated by combination drug-eluting bead transarterial chemoembolization followed by microwave thermal ablation on 6/25 and 06/10/2014. Treated by Dr Geroge Baseman    07/11/2014 Imaging   MRI abdomen 07/11/14  IMPRESSION: 1. Status post normal ablation of right hepatic lobe a hepatoma. There is no evidence for residual/recurrent enhancing tissue within the ablation site. 2. Cirrhosis and stigmata of portal venous hypertension.     Imaging   Mostly Stable MRIs from 10/03/14 to 02/09/19   02/09/2019 Imaging   MRI Abdomen  IMPRESSION: 1. LI-RADS category LR-3 lesion in segment 7 of the liver and separate LI-RADS category LR-3 lesion in segment 6 of the liver, both essentially stable in size from 08/11/2018. Surveillance imaging is suggested. 2. Hepatic  cirrhosis with splenorenal shunting indicating portal venous hypertension. 3. No recurrence along the ablation site itself. 4. Multiple gallstones. 5.  Aortic Atherosclerosis (ICD10-I70.0). 6. Splenomegaly.   02/01/2020 Imaging   MRI abdomen 02/01/20   IMPRESSION: 1. Recurrent multifocal hepatocellular carcinoma with significant growth of segment 7 right liver dome tumor, new tumor at the previous ablation defect in segment 7, enlarging segment 6 tumor and new segment 2 tumor. 2. Cirrhosis. Mild-to-moderate splenomegaly. Small paraumbilical and perisplenic varices. No ascites. 3. Cholelithiasis.  No biliary ductal dilatation. 4.  Aortic Atherosclerosis (ICD10-I70.0).   03/21/2020 Procedure   He was treated with Y90 Radio embolizatino on 03/21/20 and 04/18/20   08/07/2020 Imaging   MRI Abdomen  IMPRESSION: 1. Marked response to therapy following radioembolization in the RIGHT and LEFT hepatic lobes. Given remaining heterogeneity in hepatic subsegment VII and hepatic subsegment II these areas are characterized as LR TR equivocal at this time. 2. Treated lesion in hepatic subsegment VI displays no residual enhancement. LR TR nonviable. 3. Signs of new ascites and presumed portal colopathy involving the RIGHT hemicolon. Correlate with any new abdominal symptoms. 4.  LEFT effusion. 5. Cirrhosis with splenomegaly.    PREVIOUS THERAPY: S/p microwave ablation in 05/2014, recurrence in 01/2020 with Y 90 on 03/21/2020 and 04/18/2020  CURRENT THERAPY: Observation  INTERVAL HISTORY: Jeffrey Snow returns for follow-up as scheduled.  He was last seen by Jeffrey Snow on 04/24/2020.  8-monthfollow-up MRI on 08/07/2020 showed marked response to therapy (LR TR equivocal), AFP improved to 20.  He has had multiple subsequent ER visits over the last few months for fall, rectal bleeding, abdominal swelling and most recently in December for dysphagia and sore throat.  CT neck on 12/6 showed soft tissue thickening  along the lingual surface of  the epiglottis extending into the preepiglottic fat, a mass was suspected concerning for neoplasm.  He was referred to [facility] who planned laryngoscopy and biopsy on 12/15 but the patient canceled.  He is on Augmentin per ENT, still complains of throat tightness.  He presents today by himself in a wheelchair.  When he was at [facility] he did not have his routine meds, now back at home and back on Lasix and Aldactone.  He had paracentesis in November, he has recurrent bloating and significant abdominal distention.  He has right upper quadrant pain, cannot get comfortable.  Since fluid reaccumulated in the last month or so it is difficult to breathe.  He has intermittent diarrhea, taking Pepto.  No nausea or vomiting.  Denies recent fever, chills, cough.  He is on Protonix for "bleeding ulcer," denies obvious Jeffrey bleeding.  He is followed by Jeffrey Vicie Mutters, NP.    MEDICAL HISTORY:  Past Medical History:  Diagnosis Date   Abnormal MRI of abdomen, liver  04/21/2014   Anxiety    Cirrhosis, alcoholic ([place]) 12/21/6061   Claudication of left lower extremity ([place]) 10/2020   Depression    HCC (hepatocellular carcinoma) ([place])    Hearing deficit    Hepatitis C    Hypertension    Liver mass    Panic attack     SURGICAL HISTORY: Past Surgical History:  Procedure Laterality Date   ABDOMINAL AORTAGRAM N/A 12/27/2013   Procedure: ABDOMINAL Maxcine Ham;  Surgeon: Conrad [place], MD;  Location: [facility] CATH LAB;  Service: Cardiovascular;  Laterality: N/A;   AORTOGRAM     COSMETIC SURGERY     facial reconstructive surgery   ESOPHAGOGASTRODUODENOSCOPY N/A 12/17/2014   Procedure: ESOPHAGOGASTRODUODENOSCOPY (EGD);  Surgeon: Missy Sabins, MD;  Location: [facility];  Service: Endoscopy;  Laterality: N/A;   ESOPHAGOGASTRODUODENOSCOPY N/A 09/05/2015   Procedure: ESOPHAGOGASTRODUODENOSCOPY (EGD);  Surgeon: Wonda Horner, MD;  Location: [facility];  Service: Endoscopy;   Laterality: N/A;   IR ANGIOGRAM EXTREMITY RIGHT  03/21/2020   IR ANGIOGRAM SELECTIVE EACH ADDITIONAL VESSEL  03/10/2020   IR ANGIOGRAM SELECTIVE EACH ADDITIONAL VESSEL  03/10/2020   IR ANGIOGRAM SELECTIVE EACH ADDITIONAL VESSEL  03/10/2020   IR ANGIOGRAM SELECTIVE EACH ADDITIONAL VESSEL  03/10/2020   IR ANGIOGRAM SELECTIVE EACH ADDITIONAL VESSEL  03/10/2020   IR ANGIOGRAM SELECTIVE EACH ADDITIONAL VESSEL  03/21/2020   IR ANGIOGRAM SELECTIVE EACH ADDITIONAL VESSEL  03/21/2020   IR ANGIOGRAM SELECTIVE EACH ADDITIONAL VESSEL  03/21/2020   IR ANGIOGRAM SELECTIVE EACH ADDITIONAL VESSEL  03/21/2020   IR ANGIOGRAM SELECTIVE EACH ADDITIONAL VESSEL  03/21/2020   IR ANGIOGRAM SELECTIVE EACH ADDITIONAL VESSEL  04/18/2020   IR ANGIOGRAM SELECTIVE EACH ADDITIONAL VESSEL  04/18/2020   IR ANGIOGRAM VISCERAL SELECTIVE  03/10/2020   IR ANGIOGRAM VISCERAL SELECTIVE  03/21/2020   IR ANGIOGRAM VISCERAL SELECTIVE  04/18/2020   IR EMBO ARTERIAL NOT HEMORR HEMANG INC GUIDE ROADMAPPING  03/10/2020   IR EMBO TUMOR ORGAN ISCHEMIA INFARCT INC GUIDE ROADMAPPING  03/21/2020   IR EMBO TUMOR ORGAN ISCHEMIA INFARCT INC GUIDE ROADMAPPING  04/18/2020   IR GENERIC HISTORICAL  01/19/2015   IR RADIOLOGIST EVAL & MGMT 01/19/2015 Jacqulynn Cadet, MD [facility]   IR GENERIC HISTORICAL  07/23/2016   IR RADIOLOGIST EVAL & MGMT 07/23/2016 Jacqulynn Cadet, MD [facility]   IR GENERIC HISTORICAL  05/30/2015   IR RADIOLOGIST EVAL & MGMT 05/30/2015 [facility]   IR GENERIC HISTORICAL  10/04/2014  IR RADIOLOGIST EVAL & MGMT 10/04/2014 Jacqulynn Cadet, MD [facility]   IR GENERIC HISTORICAL  02/11/2017   IR RADIOLOGIST EVAL & MGMT 02/11/2017 Jacqulynn Cadet, MD [facility]   IR PARACENTESIS  10/23/2020   IR RADIOLOGIST EVAL & MGMT  08/13/2017   IR RADIOLOGIST EVAL & MGMT  08/11/2018   IR RADIOLOGIST EVAL & MGMT  02/09/2019   IR RADIOLOGIST EVAL & MGMT  02/10/2020   IR RADIOLOGIST EVAL & MGMT  05/11/2020   IR  RADIOLOGIST EVAL & MGMT  08/09/2020   IR US GUIDE VASC ACCESS LEFT  03/10/2020   IR US GUIDE VASC ACCESS LEFT  03/21/2020   IR US GUIDE VASC ACCESS LEFT  04/18/2020   IR US GUIDE VASC ACCESS RIGHT  03/21/2020   LOWER EXTREMITY ANGIOGRAM Bilateral 12/27/2013   Procedure: LOWER EXTREMITY ANGIOGRAM;  Surgeon: Conrad [place], MD;  Location: [facility] CATH LAB;  Service: Cardiovascular;  Laterality: Bilateral;    I have reviewed the social history and family history with the patient and they are unchanged from previous note.  ALLERGIES:  is allergic to folic acid and oxycodone.  MEDICATIONS:  Current Outpatient Medications  Medication Sig Dispense Refill   albuterol (VENTOLIN HFA) 108 (90 Base) MCG/ACT inhaler Inhale 2 puffs into the lungs every 6 (six) hours as needed for wheezing or shortness of breath. 18 g 11   ALPRAZolam (XANAX) 0.25 MG tablet Take 0.25 mg by mouth 2 (two) times daily as needed for anxiety.      amoxicillin-clavulanate (AUGMENTIN) 875-125 MG tablet Take 1 tablet by mouth every 12 (twelve) hours for 10 days. 20 tablet 0   FLUoxetine (PROZAC) 40 MG capsule Take 40 mg by mouth daily.     furosemide (LASIX) 20 MG tablet Take 1 tablet (20 mg total) by mouth daily. 90 tablet 3   gabapentin (NEURONTIN) 300 MG capsule Take 1 capsule (300 mg total) by mouth 3 (three) times daily. 90 capsule 6   loperamide (IMODIUM) 2 MG capsule Take 1-2 capsules (2-4 mg total) by mouth as needed for diarrhea or loose stools. 30 capsule 3   ondansetron (ZOFRAN) 8 MG tablet Take 1 tablet (8 mg total) by mouth every 8 (eight) hours as needed for nausea or vomiting. 20 tablet 3   pantoprazole (PROTONIX) 40 MG tablet TAKE 1 TABLET(40 MG) BY MOUTH TWICE DAILY BEFORE A MEAL (Patient taking differently: Take 40 mg by mouth 2 (two) times daily. ) 180 tablet 3   predniSONE (DELTASONE) 10 MG tablet Day #1: Take 6 tablets by mouth Day #2: Take 5 tablets by mouth Day #3: Take 4 tablets by mouth Day #4: Take 3  tablets by mouth Day #5: Take 2 tablets by mouth Day #6: Take 1 tablet, then complete. 21 tablet 0   spironolactone (ALDACTONE) 25 MG tablet Take 3 tablets (75 mg total) by mouth daily. 30 tablet 0   No current facility-administered medications for this visit.    PHYSICAL EXAMINATION: ECOG PERFORMANCE STATUS: 1 - Symptomatic but completely ambulatory  Vitals:   11/30/20 1148  BP: 130/71  Pulse: 71  Resp: 18  Temp: 98.6 F (37 C)  SpO2: 95%   Filed Weights   11/30/20 1148  Weight: 183 lb 6.4 oz (83.2 kg)    GENERAL:alert, no distress and comfortable SKIN: Dry skin, no rash EYES: sclera clear Neck: Palpable node/mass in the upper right cervical chain LUNGS: Diminished, normal breathing effort HEART: regular rate & rhythm, mild bilateral lower extremity  edema ABDOMEN:abdomen distended, firm consistent with ascites.  Difficult to auscultate bowel sounds or palpate liver NEURO: alert & oriented x 3 with fluent speech  LABORATORY DATA:  I have reviewed the data as listed CBC Latest Ref Rng & Units 11/30/2020 11/20/2020 10/07/2020  WBC 4.0 - 10.5 K/uL 6.0 4.9 2.7(L)  Hemoglobin 13.0 - 17.0 g/dL 14.0 13.9 13.4  Hematocrit 39.0 - 52.0 % 39.9 41.8 40.7  Platelets 150 - 400 K/uL 55(L) 62(L) 37(L)     CMP Latest Ref Rng & Units 11/30/2020 11/20/2020 10/07/2020  Glucose 70 - 99 mg/dL 111(H) 92 127(H)  BUN 8 - 23 mg/dL _0 Creatinine 0.61 - 1.24 mg/dL 0.76 0.71 0.91  Sodium 135 - 145 mmol/L 135 135 138  Potassium 3.5 - 5.1 mmol/L 3.5 3.9 3.8  Chloride 98 - 111 mmol/L 104 100 103  CO2 22 - 32 mmol/L _1 Calcium 8.9 - 10.3 mg/dL 8.1(L) 8.8(L) 8.6(L)  Total Protein 6.5 - 8.1 g/dL 5.7(L) - 6.0(L)  Total Bilirubin 0.3 - 1.2 mg/dL 2.8(H) - 3.6(H)  Alkaline Phos 38 - 126 U/L 154(H) - 125  AST 15 - 41 U/L 39 - 33  ALT 0 - 44 U/L 43 - 31      RADIOGRAPHIC STUDIES: I have personally reviewed the radiological images as listed and agreed with the findings in the  report. No results found.   ASSESSMENT & PLAN: Jeffrey Snow is a 64 y.o. Caucasian male with a history of Anxiety, liver Cirrhosis, depression, Hep C   1. Recurrent multifocal hepatocellular carcinoma   -He was initially diagnosed by MRI and elevated tumor marker on 04/25/14.  S/p microwave ablation in 05/2014.  He has longstanding history of alcohol and hepatitis C related liver cirrhosis. -He had been seen by Dr Geroge Baseman with MRIs since then. He was found to have multifocal cancer recurrence in liver on 02/01/20 MRI with elevated AFP   -Given his multifocal cancer in liver he is not eligible for liver surgery or transplant. His cancer is no longer curable but still treatable to control his disease and prolong his life. -S/p Y90 on 03/21/20 and 04/18/20. MRI 07/2020 showed marked response and AFP improved to 20 -Today he presents with significant ascites and RUQ pain, concerning for HCC progression.  I am referring him for restaging MRI and therapeutic paracentesis with cytology. AFP is pending -f/up after imaging to review results and treatment plan.   2.  Preepiglottic mass -He presented to ED on 12/6 for evaluation of hoarseness and sore throat.  CT showed preepiglottic mass concerning for neoplasm -Scheduled for laryngoscopy and biopsy on 12/15 but canceled due to transportation issues -Patient plans to reschedule Jeffrey Snow  3. Liver Cirrhosis, Hep C (treated) -He notes he quit drinking alcohol 10-15 years ago after drinking 8-10 beers a day. He also quit smoking at the same time after smoking for 35 years. He notes he was negative for prior lung cancer screening.  -He was diagnosed with liver cirrhosis by Dr Jimmye Norman about 10 years ago and he was referred to Dr. Geroge Baseman who has been monitoring him since.  -His 2016 upper Endoscopies by Dr Penelope Coop and Dr Amedeo Plenty were negative for Esophageal Varices.  Patient reports being on Protonix for a "bleeding ulcer." -S/p Harvoni for his Hep C  by Dr Amedeo Plenty. He was diagnosed after his Cirrhosis.  -He was going to be seen by [facility] for liver transplant, but not found to be eligible  per Dr Geroge Baseman.  -He is on Lasix and Aldactone, managed by NP Kathe Becton  -He presents today with significant ascites, he is being scheduled for paracentesis  4. Anxiety/Depression  -On Xanax, not taking Prozac  5. Chronic right foot neuropathy  -Pain contract with [facility] medical, on oxycodone and gabapentin  6. Social Support  -He is single with 3 daughters who live out of town.  We are in contact with his daughter Erica Martinique for appointment dates and assistance getting his med refills   Disposition: Mr. Musselman appears stable. He presents with significant ascites and RUQ pain. Alk phos is elevated, T bili actually lower than his baseline. The AFP is pending. The clinical picture is concerning for [place] progression. He has been scheduled for restaging MRI and therapeutic paracentesis with cytology on 12/21/19.  We reviewed symptom management for n/d. He will pick up prescriptions for zofran and imodium.   He is pending work-up of a preepiglottic mass that is concerning for malignancy. He will reschedule laryngoscopy and biopsy with Jeffrey Snow.  We will see him back 12/23/19 to review MRI and discuss his care plan.    Orders Placed This Encounter  Procedures   MR Abdomen W Wo Contrast    Standing Status:   Future    Standing Expiration Date:   11/30/2021    Order Specific Question:   If indicated for the ordered procedure, I authorize the administration of contrast media per Radiology protocol    Answer:   Yes    Order Specific Question:   What is the patient's sedation requirement?    Answer:   No Sedation    Order Specific Question:   Does the patient have a pacemaker or implanted devices?    Answer:   No    Order Specific Question:   Preferred imaging location?    Answer:   [facility] (table limit - 550 lbs)    US Paracentesis    Standing Status:   Future    Standing Expiration Date:   11/30/2021    Order Specific Question:   If therapeutic, is there a maximum amount of fluid to be removed?    Answer:   Yes    Order Specific Question:   What is the maximum amount of fluide to be removed?    Answer:   5L    Order Specific Question:   Are labs required for specimen collection?    Answer:   Yes    Order Specific Question:   Lab orders requested (DO NOT place separate lab orders, these will be automatically ordered during procedure specimen collection):    Answer:   Cytology - Non Pap    Order Specific Question:   Is Albumin medication needed?    Answer:   No    Order Specific Question:   Reason for Exam (SYMPTOM  OR DIAGNOSIS REQUIRED)    Answer:   [place], ascites    Order Specific Question:   Preferred imaging location?    Answer:   [facility]   All questions were answered. The patient knows to call the clinic with any problems, questions or concerns. No barriers to learning were detected. Total encounter time was 40 minutes.      Alla Feeling, NP 11/30/20

## 2020-11-29 NOTE — Telephone Encounter (Signed)
Mr Grimaldo called stating he does not have transportation for appt tomorrow.  I called [place] transportation service to arrange transportation for tomorrow 11/30/2020 1030.

## 2020-11-30 ENCOUNTER — Other Ambulatory Visit: Payer: Self-pay

## 2020-11-30 ENCOUNTER — Encounter: Payer: Self-pay | Admitting: Nurse Practitioner

## 2020-11-30 ENCOUNTER — Telehealth (INDEPENDENT_AMBULATORY_CARE_PROVIDER_SITE_OTHER): Payer: Self-pay

## 2020-11-30 ENCOUNTER — Inpatient Hospital Stay (HOSPITAL_BASED_OUTPATIENT_CLINIC_OR_DEPARTMENT_OTHER): Payer: Medicare Other | Admitting: Nurse Practitioner

## 2020-11-30 ENCOUNTER — Inpatient Hospital Stay: Payer: Medicare Other | Attending: Hematology

## 2020-11-30 VITALS — BP 130/71 | HR 71 | Temp 98.6°F | Resp 18 | Ht 68.0 in | Wt 183.4 lb

## 2020-11-30 DIAGNOSIS — C220 Liver cell carcinoma: Secondary | ICD-10-CM | POA: Diagnosis present

## 2020-11-30 DIAGNOSIS — K7031 Alcoholic cirrhosis of liver with ascites: Secondary | ICD-10-CM | POA: Diagnosis not present

## 2020-11-30 DIAGNOSIS — I700 Atherosclerosis of aorta: Secondary | ICD-10-CM | POA: Diagnosis not present

## 2020-11-30 DIAGNOSIS — Z79899 Other long term (current) drug therapy: Secondary | ICD-10-CM | POA: Insufficient documentation

## 2020-11-30 DIAGNOSIS — R161 Splenomegaly, not elsewhere classified: Secondary | ICD-10-CM | POA: Insufficient documentation

## 2020-11-30 DIAGNOSIS — B1920 Unspecified viral hepatitis C without hepatic coma: Secondary | ICD-10-CM | POA: Insufficient documentation

## 2020-11-30 DIAGNOSIS — F418 Other specified anxiety disorders: Secondary | ICD-10-CM | POA: Insufficient documentation

## 2020-11-30 LAB — CMP (CANCER CENTER ONLY)
ALT: 43 U/L (ref 0–44)
AST: 39 U/L (ref 15–41)
Albumin: 2.7 g/dL — ABNORMAL LOW (ref 3.5–5.0)
Alkaline Phosphatase: 154 U/L — ABNORMAL HIGH (ref 38–126)
Anion gap: 6 (ref 5–15)
BUN: 8 mg/dL (ref 8–23)
CO2: 25 mmol/L (ref 22–32)
Calcium: 8.1 mg/dL — ABNORMAL LOW (ref 8.9–10.3)
Chloride: 104 mmol/L (ref 98–111)
Creatinine: 0.76 mg/dL (ref 0.61–1.24)
GFR, Estimated: >60 mL/min (ref >60)
Glucose, Bld: 111 mg/dL — ABNORMAL HIGH (ref 70–99)
Potassium: 3.5 mmol/L (ref 3.5–5.1)
Sodium: 135 mmol/L (ref 135–145)
Total Bilirubin: 2.8 mg/dL — ABNORMAL HIGH (ref 0.3–1.2)
Total Protein: 5.7 g/dL — ABNORMAL LOW (ref 6.5–8.1)

## 2020-11-30 LAB — CBC WITH DIFFERENTIAL (CANCER CENTER ONLY)
Abs Immature Granulocytes: 0.06 10*3/uL (ref 0.00–0.07)
Basophils Absolute: 0 10*3/uL (ref 0.0–0.1)
Basophils Relative: 1 %
Eosinophils Absolute: 0 10*3/uL (ref 0.0–0.5)
Eosinophils Relative: 1 %
HCT: 39.9 % (ref 39.0–52.0)
Hemoglobin: 14 g/dL (ref 13.0–17.0)
Immature Granulocytes: 1 %
Lymphocytes Relative: 11 %
Lymphs Abs: 0.7 10*3/uL (ref 0.7–4.0)
MCH: 33.4 pg (ref 26.0–34.0)
MCHC: 35.1 g/dL (ref 30.0–36.0)
MCV: 95.2 fL (ref 80.0–100.0)
Monocytes Absolute: 1.2 10*3/uL — ABNORMAL HIGH (ref 0.1–1.0)
Monocytes Relative: 20 %
Neutro Abs: 4 10*3/uL (ref 1.7–7.7)
Neutrophils Relative %: 66 %
Platelet Count: 55 10*3/uL — ABNORMAL LOW (ref 150–400)
RBC: 4.19 MIL/uL — ABNORMAL LOW (ref 4.22–5.81)
RDW: 14.3 % (ref 11.5–15.5)
WBC Count: 6 10*3/uL (ref 4.0–10.5)
nRBC: 0 % (ref 0.0–0.2)

## 2020-11-30 MED ORDER — LOPERAMIDE HCL 2 MG PO CAPS
2.0000 mg | ORAL_CAPSULE | ORAL | 3 refills | Status: AC | PRN
Start: 2020-11-30 — End: ?

## 2020-11-30 MED ORDER — ONDANSETRON HCL 8 MG PO TABS: 8.0000 mg | ORAL_TABLET | Freq: Three times a day (TID) | ORAL | 3 refills | Status: AC | PRN

## 2020-12-01 ENCOUNTER — Telehealth: Payer: Self-pay | Admitting: Nurse Practitioner

## 2020-12-01 LAB — AFP TUMOR MARKER: AFP, Serum, Tumor Marker: 11.3 ng/mL — ABNORMAL HIGH (ref 0.0–8.3)

## 2020-12-01 NOTE — Telephone Encounter (Signed)
Scheduled appointment per 12/16 los. Called patient, no answer. Left message with appointment date and time.

## 2020-12-05 ENCOUNTER — Other Ambulatory Visit: Payer: Self-pay | Admitting: Interventional Radiology

## 2020-12-05 DIAGNOSIS — C220 Liver cell carcinoma: Secondary | ICD-10-CM

## 2020-12-06 ENCOUNTER — Telehealth (INDEPENDENT_AMBULATORY_CARE_PROVIDER_SITE_OTHER): Payer: Medicare Other | Admitting: Family Medicine

## 2020-12-06 ENCOUNTER — Telehealth: Payer: Medicare Other | Admitting: Family Medicine

## 2020-12-06 DIAGNOSIS — I10 Essential (primary) hypertension: Secondary | ICD-10-CM

## 2020-12-06 DIAGNOSIS — J392 Other diseases of pharynx: Secondary | ICD-10-CM

## 2020-12-06 DIAGNOSIS — F419 Anxiety disorder, unspecified: Secondary | ICD-10-CM

## 2020-12-06 DIAGNOSIS — K7031 Alcoholic cirrhosis of liver with ascites: Secondary | ICD-10-CM

## 2020-12-06 DIAGNOSIS — C220 Liver cell carcinoma: Secondary | ICD-10-CM | POA: Diagnosis not present

## 2020-12-06 DIAGNOSIS — Z09 Encounter for follow-up examination after completed treatment for conditions other than malignant neoplasm: Secondary | ICD-10-CM

## 2020-12-06 NOTE — Progress Notes (Signed)
Virtual Visit via Telephone Note  I connected with Jeffrey Snow on 12/06/20 at  9:00 AM EST by telephone and verified that I am speaking with the correct person using two identifiers.  Location: Patient: Home Provider: Office   I discussed the limitations, risks, security and privacy concerns of performing an evaluation and management service by telephone and the availability of in person appointments. I also discussed with the patient that there may be a patient responsible charge related to this service. The patient expressed understanding and agreed to proceed.   History of Present Illness:  Patient Active Problem List   Diagnosis Date Noted  . Traumatic rhabdomyolysis ([place]) 09/19/2020  . History of GI bleed 09/19/2020  . Rhabdomyolysis 09/19/2020  . Wheezing 11/28/2019  . Neuropathy 11/28/2019  . Anxiety 11/28/2019  . Acute upper GI bleed 09/05/2015  . Lactic acidosis 09/05/2015  . Hyperkalemia 09/05/2015  . Hemorrhagic shock ([place]) 09/05/2015  . Urinary tract infection, acute 08/31/2015  . Anemia, blood loss 12/16/2014  . Melena 12/16/2014  . Anemia 12/16/2014  . Abdominal pain, chronic, right upper quadrant 07/06/2014  . [place] (hepatocellular carcinoma) ([place]) 06/09/2014  . Other pancytopenia ([place]) 04/27/2014  . Cellulitis of leg 04/24/2014  . Elevated troponin 04/23/2014  . Normocytic anemia 04/21/2014  . Cirrhosis, alcoholic ([place]) 123456  . Hyponatremia 04/21/2014  . Hypokalemia 04/21/2014  . Metabolic acidosis 123456  . Hepatitis C 04/21/2014  . Abnormal MRI of abdomen, liver  04/21/2014  . Leg pain 04/21/2014  . Rash and nonspecific skin eruption 04/21/2014  . T wave inversion in EKG 04/21/2014  . Right foot pain 03/31/2014  . Hyperglycemia 03/03/2014  . Vitamin D deficiency disease 03/03/2014  . HTN (hypertension) 03/03/2014  . Elevated LFTs 03/03/2014  . Screening for colon cancer 03/03/2014  . Annual physical exam 03/03/2014  . PVD (peripheral  vascular disease) ([place]) 12/03/2013  . PAD (peripheral artery disease) ([place]) 10/26/2013  . Arterial insufficiency ([place]) 10/26/2013  . Fall 10/26/2013  . Depression 09/22/2013  . Insomnia 09/22/2013  . Claudication of left lower extremity ([place])   . Edema 08/20/2013  . Pain in limb 08/20/2013  . Atherosclerosis of native arteries of the extremities with intermittent claudication 08/20/2013  . Thrombocytopenia ([place]) 10/12/2012    Current Status: Since his last office visit, he is now receiving Home Aid 5 days a week to assist him with ADLs. He is scheduled for Paracentesis on 12/21/2019. His anxiety is moderate today r/t his current health status. He denies suicidal ideations, homicidal ideations, or auditory hallucinations. He denies fevers, chills, fatigue, recent infections, weight loss, and night sweats. He has not had any headaches, visual changes, dizziness, and falls. No chest pain, heart palpitations, cough and shortness of breath reported. Denies GI problems such as nausea, vomiting, diarrhea, and constipation. He has no reports of blood in stools, dysuria and hematuria. He is taking all medications as prescribed. He denies pain today.   Observations/Objective:  Telephone Virtual Visit  Assessment and Plan:  1. [place] (hepatocellular carcinoma) ([place])  2. Alcoholic cirrhosis of liver with ascites ([place])  3. Throat mass  4. Essential hypertension He will continue to take medications as prescribed, to decrease high sodium intake, excessive alcohol intake, increase potassium intake, smoking cessation, and increase physical activity of at least 30 minutes of cardio activity daily. H will continue to follow Heart Healthy or DASH diet.  5. Anxiety Stable today.  6. Follow up He will keep follow up appointment.   No orders of  the defined types were placed in this encounter.   No orders of the defined types were placed in this encounter.   Referral Orders  No referral(s) requested  today    Kathe Becton, MSN, ANE, FNP-BC [facility] [place] [address] [place], [place] [address] [phone] fax  I discussed the assessment and treatment plan with the patient. The patient was provided an opportunity to ask questions and all were answered. The patient agreed with the plan and demonstrated an understanding of the instructions.   The patient was advised to call back or seek an in-person evaluation if the symptoms worsen or if the condition fails to improve as anticipated.  I provided 30 minutes of non-face-to-face time during this encounter.   Azzie Glatter, FNP

## 2020-12-07 ENCOUNTER — Other Ambulatory Visit: Payer: Self-pay | Admitting: Family Medicine

## 2020-12-15 ENCOUNTER — Encounter: Payer: Self-pay | Admitting: Family Medicine

## 2020-12-19 ENCOUNTER — Telehealth: Payer: Self-pay

## 2020-12-19 NOTE — Telephone Encounter (Signed)
Jeffrey Snow called to confirm appts for paracentesis and MRI.  I returned his call letting him know his appts are for tomorrow.  Transportation arranged for 1/5  pick up 0945 and 1/7 pick up 0915 via our transportation department.

## 2020-12-20 ENCOUNTER — Ambulatory Visit (HOSPITAL_COMMUNITY)
Admission: RE | Admit: 2020-12-20 | Discharge: 2020-12-20 | Disposition: A | Discharge status: Home / Self Care | Payer: Medicare Other | Source: Ambulatory Visit | Attending: Nurse Practitioner | Admitting: Nurse Practitioner

## 2020-12-20 ENCOUNTER — Other Ambulatory Visit: Payer: Self-pay

## 2020-12-20 DIAGNOSIS — C220 Liver cell carcinoma: Secondary | ICD-10-CM | POA: Insufficient documentation

## 2020-12-20 MED ORDER — LIDOCAINE HCL 1 % IJ SOLN
INTRAMUSCULAR | Status: AC
  Filled 2020-12-20: qty 20

## 2020-12-20 MED ORDER — GADOBUTROL 1 MMOL/ML IV SOLN
9.0000 mL | Freq: Once | INTRAVENOUS | Status: AC | PRN
  Administered 2020-12-20: 9 mL via INTRAVENOUS

## 2020-12-20 NOTE — Progress Notes (Signed)
Jeffrey Snow   [phone] Fax:(336) [phone]   Clinic Follow up Note   Patient Care Team: Azzie Glatter, FNP as PCP - General (Family Medicine) Truitt Merle, MD as Consulting Physician (Hematology) Criselda Peaches, MD as Consulting Physician (Interventional Radiology) Truitt Merle, MD as Consulting Physician (Hematology)  Date of Service:  12/22/2020  CHIEF COMPLAINT: F/u of recurrent [place]   SUMMARY OF ONCOLOGIC HISTORY: Oncology History Overview Note  Cancer Staging No matching staging information was found for the patient.    [place] (hepatocellular carcinoma) ([place])  04/25/2014 Imaging   MRI Abdomen 04/25/14 - DIAGNOSED IMPRESSION: 3.2 cm enhancing lesion in the posterior segment right hepatic lobe, increased, highly suspicious for hepatocellular carcinoma.   Cirrhosis with splenomegaly and small volume abdominal ascites. Portal vein is patent.   Cholelithiasis with suspected secondary gallbladder wall thickening/edema. If there is clinical concern for cholecystitis, consider hepatobiliary nuclear medicine scan.   06/09/2014 Initial Diagnosis   HCC (hepatocellular carcinoma) ([place])   06/09/2014 Procedure   He was treated by combination drug-eluting bead transarterial chemoembolization followed by microwave thermal ablation on 6/25 and 06/10/2014. Treated by Dr Geroge Baseman    07/11/2014 Imaging   MRI abdomen 07/11/14  IMPRESSION: 1. Status post normal ablation of right hepatic lobe a hepatoma. There is no evidence for residual/recurrent enhancing tissue within the ablation site. 2. Cirrhosis and stigmata of portal venous hypertension.     Imaging   Mostly Stable MRIs from 10/03/14 to 02/09/19   02/09/2019 Imaging   MRI Abdomen  IMPRESSION: 1. LI-RADS category LR-3 lesion in segment 7 of the liver and separate LI-RADS category LR-3 lesion in segment 6 of the liver, both essentially stable in size from 08/11/2018. Surveillance imaging is  suggested. 2. Hepatic cirrhosis with splenorenal shunting indicating portal venous hypertension. 3. No recurrence along the ablation site itself. 4. Multiple gallstones. 5.  Aortic Atherosclerosis (ICD10-I70.0). 6. Splenomegaly.   02/01/2020 Imaging   MRI abdomen 02/01/20   IMPRESSION: 1. Recurrent multifocal hepatocellular carcinoma with significant growth of segment 7 right liver dome tumor, new tumor at the previous ablation defect in segment 7, enlarging segment 6 tumor and new segment 2 tumor. 2. Cirrhosis. Mild-to-moderate splenomegaly. Small paraumbilical and perisplenic varices. No ascites. 3. Cholelithiasis.  No biliary ductal dilatation. 4.  Aortic Atherosclerosis (ICD10-I70.0).   03/21/2020 Procedure   He was treated with Y90 Radio embolizatino on 03/21/20 and 04/18/20   08/07/2020 Imaging   MRI Abdomen  IMPRESSION: 1. Marked response to therapy following radioembolization in the RIGHT and LEFT hepatic lobes. Given remaining heterogeneity in hepatic subsegment VII and hepatic subsegment II these areas are characterized as LR TR equivocal at this time. 2. Treated lesion in hepatic subsegment VI displays no residual enhancement. LR TR nonviable. 3. Signs of new ascites and presumed portal colopathy involving the RIGHT hemicolon. Correlate with any new abdominal symptoms. 4.  LEFT effusion. 5. Cirrhosis with splenomegaly.      CURRENT THERAPY:  Observation  INTERVAL HISTORY:  Jeffrey Snow is here for a follow up. He was last seen by me 8 months ago and seen by NP Lacie last month in interim. He presents to the clinic alone. He notes he still has LE swelling with skin redness and itching. He notes it has been red for 2 weeks. He currently denies pain. His appetite is fair giving his preepiglottis mass. I reviewed medication list with him. He is very hard of hearing today. He notes he needs hearing aid but  has not been able to get them yet.   He has had his COVID  vaccination, but not booster.    REVIEW OF SYSTEMS:   Constitutional: Denies fevers, chills or abnormal weight loss Eyes: Denies blurriness of vision Ears, nose, mouth, throat, and face: Denies mucositis or sore throat Respiratory: Denies cough, dyspnea or wheezes Cardiovascular: Denies palpitation, chest discomfort(+) lower extremity swelling and skin redness Gastrointestinal:  Denies nausea, heartburn or change in bowel habits Skin: Denies abnormal skin rashes Lymphatics: Denies new lymphadenopathy or easy bruising Neurological:Denies numbness, tingling or new weaknesses Behavioral/Psych: Mood is stable, no new changes  All other systems were reviewed with the patient and are negative.  MEDICAL HISTORY:  Past Medical History:  Diagnosis Date  . Abnormal MRI of abdomen, liver  04/21/2014  . Anxiety   . Cirrhosis, alcoholic ([place]) 5/0/3546  . Claudication of left lower extremity ([place]) 10/2020  . Depression   . [place] (hepatocellular carcinoma) ([place])   . Hearing deficit   . Hepatitis C   . Hypertension   . Liver mass   . Panic attack     SURGICAL HISTORY: Past Surgical History:  Procedure Laterality Date  . ABDOMINAL AORTAGRAM N/A 12/27/2013   Procedure: ABDOMINAL Maxcine Ham;  Surgeon: Conrad Elberta, MD;  Location[facility] CATH LAB;  Service: Cardiovascular;  Laterality: N/A;  . AORTOGRAM    . COSMETIC SURGERY     facial reconstructive surgery  . ESOPHAGOGASTRODUODENOSCOPY N/A 12/17/2014   Procedure: ESOPHAGOGASTRODUODENOSCOPY (EGD);  Surgeon: Missy Sabins, MD;  Location: [facility] END[facility];  Service: Endoscopy;  Laterality: N/A;  . ESOPHAGOGASTRODUODENOSCOPY N/A 09/05/2015   Procedure: ESOPHAGOGASTRODUODENOSCOPY (EGD);  Surgeon: Wonda Horner, MD;  Location: [facility] ENDOS[facility];  Service: Endoscopy;  Laterality: N/A;  . IR ANGIOGRAM EXTREMITY RIGHT  03/21/2020  . IR ANGIOGRAM SELECTIVE EACH ADDITIONAL VESSEL  03/10/2020  . IR ANGIOGRAM SELECTIVE EACH ADDITIONAL VESSEL  03/10/2020  . IR ANGIOGRAM  SELECTIVE EACH ADDITIONAL VESSEL  03/10/2020  . IR ANGIOGRAM SELECTIVE EACH ADDITIONAL VESSEL  03/10/2020  . IR ANGIOGRAM SELECTIVE EACH ADDITIONAL VESSEL  03/10/2020  . IR ANGIOGRAM SELECTIVE EACH ADDITIONAL VESSEL  03/21/2020  . IR ANGIOGRAM SELECTIVE EACH ADDITIONAL VESSEL  03/21/2020  . IR ANGIOGRAM SELECTIVE EACH ADDITIONAL VESSEL  03/21/2020  . IR ANGIOGRAM SELECTIVE EACH ADDITIONAL VESSEL  03/21/2020  . IR ANGIOGRAM SELECTIVE EACH ADDITIONAL VESSEL  03/21/2020  . IR ANGIOGRAM SELECTIVE EACH ADDITIONAL VESSEL  04/18/2020  . IR ANGIOGRAM SELECTIVE EACH ADDITIONAL VESSEL  04/18/2020  . IR ANGIOGRAM VISCERAL SELECTIVE  03/10/2020  . IR ANGIOGRAM VISCERAL SELECTIVE  03/21/2020  . IR ANGIOGRAM VISCERAL SELECTIVE  04/18/2020  . IR EMBO ARTERIAL NOT HEMORR HEMANG INC GUIDE ROADMAPPING  03/10/2020  . IR EMBO TUMOR ORGAN ISCHEMIA INFARCT INC GUIDE ROADMAPPING  03/21/2020  . IR EMBO TUMOR ORGAN ISCHEMIA INFARCT INC GUIDE ROADMAPPING  04/18/2020  . IR GENERIC HISTORICAL  01/19/2015   IR RADIOLOGIST EVAL & MGMT 01/19/2015 Jacqulynn Cadet, MD [facility] INTERV RAD  . IR GENERIC HISTORICAL  07/23/2016   IR RADIOLOGIST EVAL & MGMT 07/23/2016 Jacqulynn Cadet, MD [facility] INTERV RAD  . IR GENERIC HISTORICAL  05/30/2015   IR RADIOLOGIST EVAL & MGMT 05/30/2015 [facility] INTERV RAD  . IR GENERIC HISTORICAL  10/04/2014   IR RADIOLOGIST EVAL & MGMT 10/04/2014 Jacqulynn Cadet, MD [facility] INTERV RAD  . IR GENERIC HISTORICAL  02/11/2017   IR RADIOLOGIST EVAL & MGMT 02/11/2017 Jacqulynn Cadet, MD [facility] INTERV RAD  . IR PARACENTESIS  10/23/2020  . IR  RADIOLOGIST EVAL & MGMT  08/13/2017  . IR RADIOLOGIST EVAL & MGMT  08/11/2018  . IR RADIOLOGIST EVAL & MGMT  02/09/2019  . IR RADIOLOGIST EVAL & MGMT  02/10/2020  . IR RADIOLOGIST EVAL & MGMT  05/11/2020  . IR RADIOLOGIST EVAL & MGMT  08/09/2020  . IR US GUIDE VASC ACCESS LEFT  03/10/2020  . IR US GUIDE VASC ACCESS LEFT  03/21/2020  . IR US GUIDE VASC ACCESS LEFT  04/18/2020  . IR US GUIDE VASC ACCESS RIGHT   03/21/2020  . LOWER EXTREMITY ANGIOGRAM Bilateral 12/27/2013   Procedure: LOWER EXTREMITY ANGIOGRAM;  Surgeon: Conrad Dolton, MD;  Location[facility] CATH LAB;  Service: Cardiovascular;  Laterality: Bilateral;    I have reviewed the social history and family history with the patient and they are unchanged from previous note.  ALLERGIES:  is allergic to folic acid and oxycodone.  MEDICATIONS:  Current Outpatient Medications  Medication Sig Dispense Refill  . potassium chloride SA (KLOR-CON) 20 MEQ tablet Take 1 tablet (20 mEq total) by mouth daily. 30 tablet 2  . albuterol (VENTOLIN HFA) 108 (90 Base) MCG/ACT inhaler Inhale 2 puffs into the lungs every 6 (six) hours as needed for wheezing or shortness of breath. 18 g 11  . ALPRAZolam (XANAX) 0.25 MG tablet Take 0.25 mg by mouth 2 (two) times daily as needed for anxiety.  (Patient not taking: Reported on 12/06/2020)    . FLUoxetine (PROZAC) 40 MG capsule Take 40 mg by mouth daily.    . furosemide (LASIX) 20 MG tablet Take 1 tablet (20 mg total) by mouth daily. 90 tablet 3  . gabapentin (NEURONTIN) 300 MG capsule Take 1 capsule (300 mg total) by mouth 3 (three) times daily. 90 capsule 6  . loperamide (IMODIUM) 2 MG capsule Take 1-2 capsules (2-4 mg total) by mouth as needed for diarrhea or loose stools. 30 capsule 3  . ondansetron (ZOFRAN) 8 MG tablet Take 1 tablet (8 mg total) by mouth every 8 (eight) hours as needed for nausea or vomiting. 20 tablet 3  . pantoprazole (PROTONIX) 40 MG tablet TAKE 1 TABLET(40 MG) BY MOUTH TWICE DAILY BEFORE A MEAL (Patient taking differently: Take 40 mg by mouth 2 (two) times daily.) 180 tablet 3  . spironolactone (ALDACTONE) 25 MG tablet Take 3 tablets (75 mg total) by mouth daily. 30 tablet 0   No current facility-administered medications for this visit.    PHYSICAL EXAMINATION: ECOG PERFORMANCE STATUS: 2 - Symptomatic, <50% confined to bed  Vitals:   12/22/20 1008  BP: 129/67  Pulse: 71  Resp: 16  Temp: (!)  97.1 F (36.2 C)  SpO2: 95%   Filed Weights   12/22/20 1008  Weight: 177 lb 9.6 oz (80.6 kg)    GENERAL:alert, no distress and comfortable SKIN: skin color, texture, turgor are normal, no rashes or significant lesions EYES: normal, Conjunctiva are pink and non-injected, sclera clear  NECK: supple, thyroid normal size, non-tender, without nodularity LYMPH:  no palpable lymphadenopathy in the cervical, axillary  LUNGS: clear to auscultation and percussion with normal breathing effort HEART: regular rate & rhythm and no murmurs (+) lower extremity edema (L>R) with skin erythema (pictured below) ABDOMEN:abdomen non-tender and normal bowel sounds (+) Abdominal distention Musculoskeletal:no cyanosis of digits and no clubbing  NEURO: alert & oriented x 3 with fluent speech, no focal motor/sensory deficits      LABORATORY DATA:  I have reviewed the data as listed CBC Latest Ref Rng & Units  11/30/2020 11/20/2020 10/07/2020  WBC 4.0 - 10.5 K/uL 6.0 4.9 2.7(L)  Hemoglobin 13.0 - 17.0 g/dL 14.0 13.9 13.4  Hematocrit 39.0 - 52.0 % 39.9 41.8 40.7  Platelets 150 - 400 K/uL 55(L) 62(L) 37(L)     CMP Latest Ref Rng & Units 11/30/2020 11/20/2020 10/07/2020  Glucose 70 - 99 mg/dL 111(H) 92 127(H)  BUN 8 - 23 mg/dL '8 11 11  ' Creatinine 0.61 - 1.24 mg/dL 0.76 0.71 0.91  Sodium 135 - 145 mmol/L 135 135 138  Potassium 3.5 - 5.1 mmol/L 3.5 3.9 3.8  Chloride 98 - 111 mmol/L 104 100 103  CO2 22 - 32 mmol/L '25 25 27  ' Calcium 8.9 - 10.3 mg/dL 8.1(L) 8.8(L) 8.6(L)  Total Protein 6.5 - 8.1 g/dL 5.7(L) - 6.0(L)  Total Bilirubin 0.3 - 1.2 mg/dL 2.8(H) - 3.6(H)  Alkaline Phos 38 - 126 U/L 154(H) - 125  AST 15 - 41 U/L 39 - 33  ALT 0 - 44 U/L 43 - 31      RADIOGRAPHIC STUDIES: I have personally reviewed the radiological images as listed and agreed with the findings in the report. No results found.   ASSESSMENT & PLAN:  Jeffrey Snow is a 65 y.o. male with    1. Recurrent multifocal  hepatocellular carcinoma   -He was initially diagnosed on 04/25/14. He was treated with microwave ablation in 05/2014. He has longstanding history of alcohol and hepatitis C related liver cirrhosis. -He had been seen by Dr Geroge Baseman with MRIs since then. He was found to have multifocal cancer recurrence in liver on 02/01/20 MRI with elevated tumor marker again. -Given his multifocal cancer in liver he is not eligible for liver surgery or transplant. His cancer is no longer curable but still treatable to control his disease and prolong his life. -He was treated with Y90 on 03/21/20 and again on 04/18/20.  -He presented in 11/2020 with significant ascites and RUQ pain. Alk phos is elevated, T bili actually lower than his baseline. The AFP 11.3.  -He underwent Paracentesis on 12/20/20 and cytology was negative for malignancy. His MRI Liver from 12/20/20 showed No enhancing lesion within the liver to suggest recurrence of hepatocellular carcinoma or new HCC lesions. I personally reviewed with patient today  -He will f/u with Dr. Geroge Baseman on 1/12, I will coordinate his f/u with Dr. Geroge Baseman  -Will continue observation. F/u in 3 months   2. Liver Cirrhosis, Hep C (treated), Ascites, LE Edema   -He quit drinking alcohol 10-15 years ago after drinking 8-10 beers a day. He also quit smoking at the same time after smoking for 35 years. He notes he was negative for prior lung cancer screening.  -He was diagnosed with liver cirrhosis by Dr Jimmye Norman about 10 years ago  -His 2016 upper Endoscopies by Dr Penelope Coop and Dr Amedeo Plenty were negative for Esophageal Varices.  -He was treated with Harvoni for his Hep C by Dr Amedeo Plenty. He was diagnosed after his Cirrhosis.  -He was seen by liver clinic NP Heide Spark before -He presented in 11/2020 with significant ascites. He underwent paracentesis on 12/20/20 with 5 liters removed. Cytology was negative for malignancy.  -He now has more pronounced LE edema, L>R, with skin erythema for  the past 2 weeks. He is on Lasix 48m and Spirolactone 777mcurrently. I recommend he increase Lasix to BID (12/22/20). I will call in potassium to take with lasix.  -I also recommend he f/u with Dr GaPenelope Coopr [facility] [place]  is cirrhosis and ascites management    3. Preepiglottic mass -He presented to ED on 11/20/20 for evaluation of hoarseness and sore throat.  CT showed preepiglottic mass concerning for neoplasm -Scheduled for laryngoscopy and biopsy on 12/29/20 with Dr Lucia Gaskins   4. Anxiety/Depression, Chronic right foot neuropathy  -For anxiety he is on Xanax -For pain, it is managed by contract with [facility] medical, on oxycodone and gabapentin   5. Social Support  -He is single with 3 daughters who live out of town.  -He notes he is able to take care of himself. He does have transportation assistance.   -He was previously in [facility] in 2021. He is back living at home and able to take care of himself.    PLAN: -I recommend him to increase lasix and I called in potassium today  -Lab and f/u in 3 months  -will email Heide Spark or his GI [name] about his ascites management     No problem-specific Assessment & Plan notes found for this encounter.   No orders of the defined types were placed in this encounter.  All questions were answered. The patient knows to call the clinic with any problems, questions or concerns. No barriers to learning was detected. The total time spent in the appointment was 30 minutes.     Truitt Merle, MD 12/22/2020   I, Joslyn Devon, am acting as scribe for Truitt Merle, MD.   I have reviewed the above documentation for accuracy and completeness, and I agree with the above.

## 2020-12-20 NOTE — Procedures (Signed)
Ultrasound-guided diagnostic and therapeutic paracentesis performed yielding 5 liters of yellow fluid. No immediate complications. A portion of the fluid was sent to the lab for cytology. EBL none.

## 2020-12-21 LAB — CYTOLOGY - NON PAP

## 2020-12-22 ENCOUNTER — Ambulatory Visit: Payer: Medicare Other | Admitting: Family Medicine

## 2020-12-22 ENCOUNTER — Ambulatory Visit (INDEPENDENT_AMBULATORY_CARE_PROVIDER_SITE_OTHER): Payer: Self-pay | Admitting: Otolaryngology

## 2020-12-22 ENCOUNTER — Inpatient Hospital Stay: Payer: Medicare Other | Attending: Hematology | Admitting: Hematology

## 2020-12-22 ENCOUNTER — Encounter: Payer: Self-pay | Admitting: Hematology

## 2020-12-22 ENCOUNTER — Other Ambulatory Visit: Payer: Self-pay

## 2020-12-22 VITALS — BP 129/67 | HR 71 | Temp 97.1°F | Resp 16 | Ht 68.0 in | Wt 177.6 lb

## 2020-12-22 DIAGNOSIS — F418 Other specified anxiety disorders: Secondary | ICD-10-CM | POA: Diagnosis not present

## 2020-12-22 DIAGNOSIS — G629 Polyneuropathy, unspecified: Secondary | ICD-10-CM | POA: Diagnosis not present

## 2020-12-22 DIAGNOSIS — D696 Thrombocytopenia, unspecified: Secondary | ICD-10-CM

## 2020-12-22 DIAGNOSIS — C220 Liver cell carcinoma: Secondary | ICD-10-CM | POA: Insufficient documentation

## 2020-12-22 DIAGNOSIS — Z79899 Other long term (current) drug therapy: Secondary | ICD-10-CM | POA: Insufficient documentation

## 2020-12-22 DIAGNOSIS — B182 Chronic viral hepatitis C: Secondary | ICD-10-CM | POA: Insufficient documentation

## 2020-12-22 DIAGNOSIS — K7030 Alcoholic cirrhosis of liver without ascites: Secondary | ICD-10-CM | POA: Diagnosis not present

## 2020-12-22 DIAGNOSIS — I10 Essential (primary) hypertension: Secondary | ICD-10-CM | POA: Diagnosis not present

## 2020-12-22 DIAGNOSIS — R1313 Dysphagia, pharyngeal phase: Secondary | ICD-10-CM

## 2020-12-22 MED ORDER — POTASSIUM CHLORIDE CRYS ER 20 MEQ PO TBCR: 20.0000 meq | EXTENDED_RELEASE_TABLET | Freq: Every day | ORAL | 2 refills | Status: AC

## 2020-12-26 ENCOUNTER — Telehealth: Payer: Self-pay | Admitting: Hematology

## 2020-12-26 NOTE — Telephone Encounter (Signed)
Scheduled appts per 1/7 los. Was unable to leave voicemail. Will mail appt reminder/calendar on 1/18 per 1/18 staff msg sent to self.

## 2020-12-27 ENCOUNTER — Encounter: Payer: Self-pay | Admitting: *Deleted

## 2020-12-27 ENCOUNTER — Other Ambulatory Visit (HOSPITAL_COMMUNITY)
Admission: RE | Admit: 2020-12-27 | Discharge: 2020-12-27 | Disposition: A | Discharge status: Home / Self Care | Payer: Medicare Other | Source: Ambulatory Visit | Attending: Otolaryngology | Admitting: Otolaryngology

## 2020-12-27 ENCOUNTER — Ambulatory Visit
Admission: RE | Admit: 2020-12-27 | Discharge: 2020-12-27 | Disposition: A | Discharge status: Home / Self Care | Payer: Medicare Other | Source: Ambulatory Visit | Attending: Interventional Radiology | Admitting: Interventional Radiology

## 2020-12-27 ENCOUNTER — Other Ambulatory Visit: Payer: Self-pay

## 2020-12-27 DIAGNOSIS — Z20822 Contact with and (suspected) exposure to covid-19: Secondary | ICD-10-CM | POA: Insufficient documentation

## 2020-12-27 DIAGNOSIS — Z01812 Encounter for preprocedural laboratory examination: Secondary | ICD-10-CM | POA: Diagnosis present

## 2020-12-27 DIAGNOSIS — C220 Liver cell carcinoma: Secondary | ICD-10-CM

## 2020-12-27 HISTORY — PX: IR RADIOLOGIST EVAL & MGMT: IMG5224

## 2020-12-27 LAB — SARS CORONAVIRUS 2 (TAT 6-24 HRS): SARS Coronavirus 2: NEGATIVE

## 2020-12-27 NOTE — Progress Notes (Signed)
Chief Complaint: Patient was consulted remotely today (TeleHealth) for multifocal hepatocellular cancer at the request of Shahana Capes K.    Referring Physician(s):   History of Present Illness:  Jeffrey Snow is a 65 y.o. male withalcoholic and HCV cirrhosis complicated by hepatocellular carcinoma, ascites and esophageal varices. He was treated by combination drug-eluting bead transarterial chemoembolization followed by microwave thermal ablation on 6/25 and 06/10/2014.  After doing well for just over 5 years, he developed a small LR 3 lesion which we were keeping an eye on. On routine surveillance, hisalpha-fetoprotein spiked to 820.3 compared to 132 last year.AnMRI dated 02/01/2020 demonstrates significant interval enlargement of previously noted LR-3 lesions. He also has at least 2 new lesions. He has at least 4 lesions, 3 in the right hemiliver and one in the left. His largest individual lesion measures up to 4.4 cm in segment 7 of the hepatic dome.  Given progression to multifocal hepatocellular carcinoma, he underwent transarterial radio embolization with right lobar treatmenton 03/21/2020 followed by a segment 2 segmentectomy on 04/18/2020.   33-month follow-up MRI dated 08/07/2020 demonstrates a marked response to therapy with LR TR (equivocal).  His AFP decreased from a maximum over 800 to 20.   MRI 12/20/20 demonstrates no evidence of residual or recurrent disease.  However, his liver has decreased in volume and there is new onset of large volume ascites.  Unfortunately, Jeffrey Snow has experienced several clinical setbacks since our last visit.  He began to develop ascites and required his first paracentesis on 10/23/2020.  He had a second paracentesis performed on 12/20/2020.  He is taking Lasix only.  Currently, he does not feel overly distended or symptomatic.  Additionally, he began developing hoarseness and sore throat and went to the emergency department in  early December.  A CT scan of the neck demonstrated a preglottic mass concerning for possible head and neck cancer.  He has since followed up with ear nose and throat and has a biopsy scheduled for this Friday.  He has been experiencing some dysphagia.  Past Medical History:  Diagnosis Date  . Abnormal MRI of abdomen, liver  04/21/2014  . Anxiety   . Cirrhosis, alcoholic (HCC) 04/21/2014  . Claudication of left lower extremity (HCC) 10/2020  . Depression   . HCC (hepatocellular carcinoma) (HCC)   . Hearing deficit   . Hepatitis C   . Hypertension   . Liver mass   . Panic attack     Past Surgical History:  Procedure Laterality Date  . ABDOMINAL AORTAGRAM N/A 12/27/2013   Procedure: ABDOMINAL Ronny Flurry;  Surgeon: Fransisco Hertz, MD;  Location: [facility] CATH LAB;  Service: Cardiovascular;  Laterality: N/A;  . AORTOGRAM    . COSMETIC SURGERY     facial reconstructive surgery  . ESOPHAGOGASTRODUODENOSCOPY N/A 12/17/2014   Procedure: ESOPHAGOGASTRODUODENOSCOPY (EGD);  Surgeon: Barrie Folk, MD;  Location: [facility] ENDOSCOPY;  Service: Endoscopy;  Laterality: N/A;  . ESOPHAGOGASTRODUODENOSCOPY N/A 09/05/2015   Procedure: ESOPHAGOGASTRODUODENOSCOPY (EGD);  Surgeon: Graylin Shiver, MD;  Location: [facility] ENDOSCOPY;  Service: Endoscopy;  Laterality: N/A;  . IR ANGIOGRAM EXTREMITY RIGHT  03/21/2020  . IR ANGIOGRAM SELECTIVE EACH ADDITIONAL VESSEL  03/10/2020  . IR ANGIOGRAM SELECTIVE EACH ADDITIONAL VESSEL  03/10/2020  . IR ANGIOGRAM SELECTIVE EACH ADDITIONAL VESSEL  03/10/2020  . IR ANGIOGRAM SELECTIVE EACH ADDITIONAL VESSEL  03/10/2020  . IR ANGIOGRAM SELECTIVE EACH ADDITIONAL VESSEL  03/10/2020  . IR ANGIOGRAM SELECTIVE EACH ADDITIONAL VESSEL  03/21/2020  . IR ANGIOGRAM  SELECTIVE EACH ADDITIONAL VESSEL  03/21/2020  . IR ANGIOGRAM SELECTIVE EACH ADDITIONAL VESSEL  03/21/2020  . IR ANGIOGRAM SELECTIVE EACH ADDITIONAL VESSEL  03/21/2020  . IR ANGIOGRAM SELECTIVE EACH ADDITIONAL VESSEL  03/21/2020  . IR ANGIOGRAM SELECTIVE EACH  ADDITIONAL VESSEL  04/18/2020  . IR ANGIOGRAM SELECTIVE EACH ADDITIONAL VESSEL  04/18/2020  . IR ANGIOGRAM VISCERAL SELECTIVE  03/10/2020  . IR ANGIOGRAM VISCERAL SELECTIVE  03/21/2020  . IR ANGIOGRAM VISCERAL SELECTIVE  04/18/2020  . IR EMBO ARTERIAL NOT HEMORR HEMANG INC GUIDE ROADMAPPING  03/10/2020  . IR EMBO TUMOR ORGAN ISCHEMIA INFARCT INC GUIDE ROADMAPPING  03/21/2020  . IR EMBO TUMOR ORGAN ISCHEMIA INFARCT INC GUIDE ROADMAPPING  04/18/2020  . IR GENERIC HISTORICAL  01/19/2015   IR RADIOLOGIST EVAL & MGMT 01/19/2015 Jacqulynn Cadet, MD [facility] INTERV RAD  . IR GENERIC HISTORICAL  07/23/2016   IR RADIOLOGIST EVAL & MGMT 07/23/2016 Jacqulynn Cadet, MD [facility] INTERV RAD  . IR GENERIC HISTORICAL  05/30/2015   IR RADIOLOGIST EVAL & MGMT 05/30/2015 [facility] INTERV RAD  . IR GENERIC HISTORICAL  10/04/2014   IR RADIOLOGIST EVAL & MGMT 10/04/2014 Jacqulynn Cadet, MD [facility] INTERV RAD  . IR GENERIC HISTORICAL  02/11/2017   IR RADIOLOGIST EVAL & MGMT 02/11/2017 Jacqulynn Cadet, MD [facility] INTERV RAD  . IR PARACENTESIS  10/23/2020  . IR RADIOLOGIST EVAL & MGMT  08/13/2017  . IR RADIOLOGIST EVAL & MGMT  08/11/2018  . IR RADIOLOGIST EVAL & MGMT  02/09/2019  . IR RADIOLOGIST EVAL & MGMT  02/10/2020  . IR RADIOLOGIST EVAL & MGMT  05/11/2020  . IR RADIOLOGIST EVAL & MGMT  08/09/2020  . IR US GUIDE VASC ACCESS LEFT  03/10/2020  . IR US GUIDE VASC ACCESS LEFT  03/21/2020  . IR US GUIDE VASC ACCESS LEFT  04/18/2020  . IR US GUIDE VASC ACCESS RIGHT  03/21/2020  . LOWER EXTREMITY ANGIOGRAM Bilateral 12/27/2013   Procedure: LOWER EXTREMITY ANGIOGRAM;  Surgeon: Conrad Moville, MD;  Location: [facility] CATH LAB;  Service: Cardiovascular;  Laterality: Bilateral;    Allergies: Aspirin, Nsaids, Folic acid, and Oxycodone  Medications: Prior to Admission medications   Medication Sig Start Date End Date Taking? Authorizing Provider  albuterol (VENTOLIN HFA) 108 (90 Base) MCG/ACT inhaler Inhale 2 puffs into the lungs every 6 (six) hours as needed for  wheezing or shortness of breath. 07/04/20   Azzie Glatter, FNP  ALPRAZolam Duanne Moron) 1 MG tablet Take 1 mg by mouth 2 (two) times daily.    [provider]  bismuth subsalicylate (PEPTO BISMOL) 262 MG/15ML suspension Take 30 mLs by mouth every 6 (six) hours as needed for diarrhea or loose stools.    [provider]  furosemide (LASIX) 20 MG tablet Take 1 tablet (20 mg total) by mouth daily. 11/22/20   Azzie Glatter, FNP  gabapentin (NEURONTIN) 300 MG capsule Take 1 capsule (300 mg total) by mouth 3 (three) times daily. 06/06/20   Azzie Glatter, FNP  loperamide (IMODIUM) 2 MG capsule Take 1-2 capsules (2-4 mg total) by mouth as needed for diarrhea or loose stools. 11/30/20   Alla Feeling, NP  ondansetron (ZOFRAN) 8 MG tablet Take 1 tablet (8 mg total) by mouth every 8 (eight) hours as needed for nausea or vomiting. 11/30/20   Alla Feeling, NP  pantoprazole (PROTONIX) 40 MG tablet TAKE 1 TABLET(40 MG) BY MOUTH TWICE DAILY BEFORE A MEAL Patient taking differently: Take 40 mg by mouth 2 (two) times daily.  11/22/20   Azzie Glatter, FNP  potassium chloride SA (KLOR-CON) 20 MEQ tablet Take 1 tablet (20 mEq total) by mouth daily. 12/22/20   Truitt Merle, MD  spironolactone (ALDACTONE) 25 MG tablet Take 3 tablets (75 mg total) by mouth daily. 10/07/20   Deliah Boston, PA-C     Family History  Problem Relation Age of Onset  . Diabetes Father   . Diabetes Brother   . Diabetes Brother     Social History   Socioeconomic History  . Marital status: Single    Spouse name: Not on file  . Number of children: 3  . Years of education: Not on file  . Highest education level: Not on file  Occupational History    Employer: [employer]  Tobacco Use  . Smoking status: Former Smoker    Packs/day: 0.50    Years: 35.00    Pack years: 17.50    Quit date: 08/21/2011    Years since quitting: 9.3  . Smokeless tobacco: Never Used  Vaping Use  . Vaping Use: Never used   Substance and Sexual Activity  . Alcohol use: Not on file    Comment: Occasional beer on the weekends  . Drug use: No    Types: Cocaine    Comment: abused cocaine in the 80's  . Sexual activity: Not Currently  Other Topics Concern  . Not on file  Social History Narrative   Single, never married.  Lives alone.  3 daughters, 16 grandchildren who are not very involved in the patient's care.  Quit smoking and heavy drinking 3.5 years ago.    Social Determinants of Health   Financial Resource Strain: High Risk  . Difficulty of Paying Living Expenses: Hard  Food Insecurity: No Food Insecurity  . Worried About Charity fundraiser in the Last Year: Never true  . Ran Out of Food in the Last Year: Never true  Transportation Needs: No Transportation Needs  . Lack of Transportation (Medical): No  . Lack of Transportation (Non-Medical): No  Physical Activity: Inactive  . Days of Exercise per Week: 0 days  . Minutes of Exercise per Session: 0 min  Stress: Stress Concern Present  . Feeling of Stress : Very much  Social Connections: Not on file    ECOG Status: 1 - Symptomatic but completely ambulatory  Review of Systems  Review of Systems: A 12 point ROS discussed and pertinent positives are indicated in the HPI above.  All other systems are negative.  Physical Exam No direct physical exam was performed (except for noted visual exam findings with Video Visits).   Vital Signs: There were no vitals taken for this visit.  Imaging: MR Abdomen W Wo Contrast  Result Date: 12/20/2020 CLINICAL DATA:  Hepatocellular carcinoma. Status post ablation and yttrium 90 treatments and April May of 2021. EXAM: MRI ABDOMEN WITHOUT AND WITH CONTRAST TECHNIQUE: Multiplanar multisequence MR imaging of the abdomen was performed both before and after the administration of intravenous contrast. CONTRAST:  72mL GADAVIST GADOBUTROL 1 MMOL/ML IV SOLN COMPARISON:  August 07, 2020, February 01, 2020, MRI exams.  FINDINGS: Lower chest: Small to moderate partially loculated LEFT pleural effusion. Hepatobiliary: Marked increase in peritoneal ascites. The liver is 4.8 cm from the lateral RIGHT abdominal wall reflecting large volume ascites. Ascites surrounds the liver and spleen. A compared to MRI of 02/01/2020 the liver is reduced in volume measuring 18.5 x 8.3 cm in axial dimension (image 34/series 31 compared to 23.0 x 9.8  cm on MRI 02/01/2020 measures similar level. On postcontrast enhanced imaging there is no discrete enhancing lesion within the liver parenchyma. Previously seen hyperenhancing lesions in the posterior RIGHT hepatic lobe are now represented by hypo/nonenhancing regions. There is some heterogeneity of enhancement pattern with RIGHT hepatic lobe related to cirrhosis/architectural distortion. Portal veins are patent. Findings are similar to interval scan in August 2021. No intrahepatic biliary duct dilatation. Gallstones are noted (image 19/series 4). No extrahepatic biliary duct dilatation. Common bile duct is normal. Pancreas: Normal pancreatic parenchymal intensity. No ductal dilatation or inflammation. Spleen: Spleen moderately enlarged measuring 17.5 cm compared to 17.1 cm in craniocaudad dimension. Venous collaterals in the splenic hilum and in the anterior pararenal space. Adrenals/urinary tract: Adrenal glands and kidneys are normal. Stomach/Bowel: Stomach and limited of the small bowel is unremarkable Vascular/Lymphatic: Abdominal aortic normal caliber. No retroperitoneal periportal lymphadenopathy. Musculoskeletal: No aggressive osseous lesion IMPRESSION: 1. No enhancing lesion within the liver to suggest recurrence of hepatocellular carcinoma or new HCC lesions. 2. Hypoenhancing regions in the RIGHT hepatic lobe most consistent treated [place]. 3. Large volume ascites new from prior. 4. Reduction in volume of the liver compared to MRI February 2021 favored combination cirrhosis and radiation therapy. 5.  Moderate splenomegaly and venous collateralization similar to prior. Electronically Signed   By: Suzy Bouchard M.D.   On: 12/20/2020 13:18   US Paracentesis  Result Date: 12/20/2020 INDICATION: Patient with history of hepatocellular carcinoma, alcoholic cirrhosis, hepatitis C, recurrent ascites. Request received for diagnostic and therapeutic paracentesis up to 5 liters. EXAM: ULTRASOUND GUIDED DIAGNOSTIC AND THERAPEUTIC PARACENTESIS MEDICATIONS: 1% lidocaine to skin and subcutaneous tissue COMPLICATIONS: None immediate. PROCEDURE: Informed written consent was obtained from the patient after a discussion of the risks, benefits and alternatives to treatment. A timeout was performed prior to the initiation of the procedure. Initial ultrasound scanning demonstrates a large amount of ascites within the right lower abdominal quadrant. The right lower abdomen was prepped and draped in the usual sterile fashion. 1% lidocaine was used for local anesthesia. Following this, a 19 gauge, 10-cm, Yueh catheter was introduced. An ultrasound image was saved for documentation purposes. The paracentesis was performed. The catheter was removed and a dressing was applied. The patient tolerated the procedure well without immediate post procedural complication. FINDINGS: A total of approximately 5 liters of yellow fluid was removed. Samples were sent to the laboratory as requested by the clinical team. IMPRESSION: Successful ultrasound-guided diagnostic and therapeutic paracentesis yielding 5 liters of peritoneal fluid. Read by: Rowe Robert, PA-C Electronically Signed   By: Aletta Edouard M.D.   On: 12/20/2020 15:00    Labs:  CBC: Recent Labs    09/25/20 2223 10/07/20 1134 11/20/20 1443 11/30/20 1115  WBC 4.6 2.7* 4.9 6.0  HGB 14.4 13.4 13.9 14.0  HCT 42.2 40.7 41.8 39.9  PLT 36* 37* 62* 55*    COAGS: Recent Labs    08/03/20 1027 09/19/20 0847 09/20/20 0446 09/25/20 2223  INR 1.3* 1.7* 1.8* 1.6*     BMP: Recent Labs    03/21/20 0812 04/18/20 0810 05/01/20 1016 08/03/20 1027 09/19/20 0847 09/25/20 2223 10/07/20 1134 11/20/20 1443 11/30/20 1115  NA 137 138 139 138   < > 136 138 135 135  K 4.3 4.3 3.8 3.1*   < > 3.5 3.8 3.9 3.5  CL 103 107 105 104   < > 103 103 100 104  CO2 25 23 25 27    < > 21* 27 25 25   GLUCOSE 99  104* 88 95   < > 75 127* 92 111*  BUN 12 15 8 7    < > 13 11 11 8   CALCIUM 9.0 8.9 8.9 8.4*   < > 8.3* 8.6* 8.8* 8.1*  CREATININE 0.86 0.88 0.79 0.93   < > 0.74 0.91 0.71 0.76  GFRNONAA >60 >60 94 86   < > >60 >60 >60 >60  GFRAA >60 >60 109 99  --   --   --   --   --    < > = values in this interval not displayed.    LIVER FUNCTION TESTS: Recent Labs    09/21/20 0239 09/25/20 2223 10/07/20 1134 11/30/20 1115  BILITOT 3.5* 4.2* 3.6* 2.8*  AST 116* 41 33 39  ALT 49* 40 31 43  ALKPHOS 72 94 125 154*  PROT 4.8* 6.2* 6.0* 5.7*  ALBUMIN 2.6* 3.4* 3.3* 2.7*    TUMOR MARKERS: Recent Labs    01/24/20 1230 05/01/20 1016 08/03/20 1027  AFPTM 820.3* 94.7* 20.0*    Assessment and Plan:  1.) Recurrent multifocal HCC Current MR imaging demonstrates no evidence of residual or recurrent disease. AFP 11.3 on 11/30/20. We will continue active surveillance. - MRI abdomen with gadolinium contrast and liver labs including AFP with accompanying clinic visit in 3 months.  2.) Ascites - Likely due to a combination of progressive cirrhosis and radiation-induced liver disease (RILD).  - He is working on setting up an appointment with Fonnie Mu at [facility] to help manage his ascites.    3.) Pre-epiglottic mass - New dx, concerning for head & neck cancer - Planned biopsy with ENT this Friday.  - Ascites may prevent placement of a g-tube in the future   Electronically Signed: Criselda Peaches 12/27/2020, 12:12 PM   I spent a total of 25 Minutes in remote  clinical consultation, greater than 50% of which was counseling/coordinating care for  multifocal hepatocellular cancer.    Visit type: Audio only (telephone). Audio (no video) only due to patient preference. Alternative for in-person consultation at [facility], [address], [place], [place]. This visit type was conducted due to national recommendations for restrictions regarding the COVID-19 Pandemic (e.g. social distancing).  This format is felt to be most appropriate for this patient at this time.  All issues noted in this document were discussed and addressed.

## 2020-12-28 NOTE — Anesthesia Preprocedure Evaluation (Addendum)
Anesthesia Evaluation  Patient identified by MRN, date of birth, ID band Patient awake    Reviewed: Allergy & Precautions, NPO status , Patient's Chart, lab work & pertinent test results  Airway Mallampati: I  TM Distance: >3 FB Neck ROM: Full    Dental  (+) Edentulous Upper, Edentulous Lower   Pulmonary former smoker,  Quit smoking 2012, 18 pack year history  Epiglottis and uvula tumor  CT: Soft tissue thickening along the lingual surface of the epiglottis extending into the preepiglottic fat to the anterior commissure. Mass is suspected and direct visual inspection is recommended. Irregularity of the adjacent thyroid cartilage bilaterally may be incidental or reflect involvement.   Pulmonary exam normal breath sounds clear to auscultation       Cardiovascular hypertension, Pt. on medications + Peripheral Vascular Disease  Normal cardiovascular exam+ Valvular Problems/Murmurs (mild MR) MR  Rhythm:Regular Rate:Normal  Echo 2015: - Left ventricle: The cavity size was normal. Wall thickness  was normal. Systolic function was normal. The estimated  ejection fraction was in the range of 60% to 65%. Wall  motion was normal; there were no regional wall motion  abnormalities. Left ventricular diastolic function  parameters were normal.  - Mitral valve: Mild regurgitation.  - Pulmonary arteries: Systolic pressure was mildly to  moderately increased. PA peak pressure: 4mm Hg (S).     Neuro/Psych PSYCHIATRIC DISORDERS Anxiety Depression negative neurological ROS     GI/Hepatic GERD  Medicated and Controlled,(+) Cirrhosis       , Hepatitis -, CEtOH abuse, hepatocellular carcinoma    Endo/Other  negative endocrine ROS  Renal/GU negative Renal ROS  negative genitourinary   Musculoskeletal negative musculoskeletal ROS (+)   Abdominal   Peds  Hematology  (+) Blood dyscrasia, anemia ,   Anesthesia Other  Findings   Reproductive/Obstetrics negative OB ROS                           Anesthesia Physical Anesthesia Plan  ASA: III  Anesthesia Plan: General   Post-op Pain Management:    Induction: Intravenous and Rapid sequence  PONV Risk Score and Plan: Ondansetron, Dexamethasone, Treatment may vary due to age or medical condition and Midazolam  Airway Management Planned: Oral ETT  Additional Equipment: None  Intra-op Plan:   Post-operative Plan: Extubation in OR  Informed Consent: I have reviewed the patients History and Physical, chart, labs and discussed the procedure including the risks, benefits and alternatives for the proposed anesthesia with the patient or authorized representative who has indicated his/her understanding and acceptance.     Dental advisory given  Plan Discussed with: CRNA  Anesthesia Plan Comments:        Anesthesia Quick Evaluation

## 2020-12-28 NOTE — Progress Notes (Addendum)
[name] returned my call and started telling me that he needs to not eat or drink after midnight and that he needs to take his fluid pill in am. I informed patient that we have patient's to hold diuretics until after midnight, [name] said that he has to take his in the morning, if not they keep him up all night.  [name],  patient said he does not have anyone.  [name] said that the on;y person he has is his and she lives 1 hour away and has a family. I told [name] that I would talk to Dr. Lucia Snow to see if he could be admitted, "I am not staying in the hospital, Dr. Lucia Snow said that it only takes a few minutes to do and then he will go home, [name] reported. I said to patient that will  Go ahead and make sure we have all the information we need to know about him, Mr Jeffrey Snow and said I don't know why Drs and the hospital have to ask the same questions over and over, Im sick of it. " I  explained to patient that it is for his safety that we make sure we have every thing up to date."  Patient hung up the phone. I called Dr Jeffrey Snow office, they will have Jeffrey Snow to call me back.  I did not hear from the office, I paged Dr. Lucia Snow, who had spoken to [name] and patient told him the same thing that. I asked Dr Jeffrey Snow to have [name] removed from the schedule, Dr. Lucia Snow said that he does not want to remove patient from the schedule, I want him to come on in , and give him the benefit of the doubt.

## 2020-12-29 ENCOUNTER — Ambulatory Visit (HOSPITAL_COMMUNITY): Payer: Medicare Other | Admitting: Anesthesiology

## 2020-12-29 ENCOUNTER — Other Ambulatory Visit (INDEPENDENT_AMBULATORY_CARE_PROVIDER_SITE_OTHER): Payer: Self-pay

## 2020-12-29 ENCOUNTER — Ambulatory Visit (HOSPITAL_COMMUNITY)
Admission: RE | Admit: 2020-12-29 | Discharge: 2020-12-29 | Disposition: A | Discharge status: Home / Self Care | Payer: Medicare Other | Attending: Otolaryngology | Admitting: Otolaryngology

## 2020-12-29 ENCOUNTER — Encounter (HOSPITAL_COMMUNITY)
Admission: RE | Disposition: A | Discharge status: Home / Self Care | Payer: Self-pay | Source: Home / Self Care | Attending: Otolaryngology

## 2020-12-29 ENCOUNTER — Encounter (HOSPITAL_COMMUNITY): Payer: Self-pay | Admitting: Otolaryngology

## 2020-12-29 ENCOUNTER — Other Ambulatory Visit: Payer: Self-pay

## 2020-12-29 DIAGNOSIS — Z8505 Personal history of malignant neoplasm of liver: Secondary | ICD-10-CM | POA: Insufficient documentation

## 2020-12-29 DIAGNOSIS — C321 Malignant neoplasm of supraglottis: Secondary | ICD-10-CM

## 2020-12-29 DIAGNOSIS — F1020 Alcohol dependence, uncomplicated: Secondary | ICD-10-CM | POA: Insufficient documentation

## 2020-12-29 DIAGNOSIS — D380 Neoplasm of uncertain behavior of larynx: Secondary | ICD-10-CM | POA: Diagnosis present

## 2020-12-29 DIAGNOSIS — Z79899 Other long term (current) drug therapy: Secondary | ICD-10-CM | POA: Insufficient documentation

## 2020-12-29 DIAGNOSIS — R490 Dysphonia: Secondary | ICD-10-CM

## 2020-12-29 DIAGNOSIS — Z87891 Personal history of nicotine dependence: Secondary | ICD-10-CM | POA: Diagnosis not present

## 2020-12-29 HISTORY — PX: DIRECT LARYNGOSCOPY: SHX5326

## 2020-12-29 LAB — CBC
HCT: 38.5 % — ABNORMAL LOW (ref 39.0–52.0)
Hemoglobin: 13 g/dL (ref 13.0–17.0)
MCH: 33.6 pg (ref 26.0–34.0)
MCHC: 33.8 g/dL (ref 30.0–36.0)
MCV: 99.5 fL (ref 80.0–100.0)
Platelets: 68 10*3/uL — ABNORMAL LOW (ref 150–400)
RBC: 3.87 MIL/uL — ABNORMAL LOW (ref 4.22–5.81)
RDW: 14.3 % (ref 11.5–15.5)
WBC: 7.4 10*3/uL (ref 4.0–10.5)
nRBC: 0 % (ref 0.0–0.2)

## 2020-12-29 LAB — COMPREHENSIVE METABOLIC PANEL
ALT: 26 U/L (ref 0–44)
AST: 40 U/L (ref 15–41)
Albumin: 2.1 g/dL — ABNORMAL LOW (ref 3.5–5.0)
Alkaline Phosphatase: 126 U/L (ref 38–126)
Anion gap: 10 (ref 5–15)
BUN: 6 mg/dL — ABNORMAL LOW (ref 8–23)
CO2: 22 mmol/L (ref 22–32)
Calcium: 8 mg/dL — ABNORMAL LOW (ref 8.9–10.3)
Chloride: 102 mmol/L (ref 98–111)
Creatinine, Ser: 0.85 mg/dL (ref 0.61–1.24)
GFR, Estimated: >60 mL/min (ref >60)
Glucose, Bld: 99 mg/dL (ref 70–99)
Potassium: 3.6 mmol/L (ref 3.5–5.1)
Sodium: 134 mmol/L — ABNORMAL LOW (ref 135–145)
Total Bilirubin: 2.9 mg/dL — ABNORMAL HIGH (ref 0.3–1.2)
Total Protein: 5.2 g/dL — ABNORMAL LOW (ref 6.5–8.1)

## 2020-12-29 SURGERY — LARYNGOSCOPY, DIRECT
Anesthesia: General | Site: Mouth

## 2020-12-29 MED ORDER — FENTANYL CITRATE (PF) 250 MCG/5ML IJ SOLN
INTRAMUSCULAR | Status: AC
  Filled 2020-12-29: qty 5

## 2020-12-29 MED ORDER — HYDROCODONE-ACETAMINOPHEN 7.5-325 MG PO TABS: 1.0000 | ORAL_TABLET | Freq: Once | ORAL | Status: DC | PRN

## 2020-12-29 MED ORDER — CEFAZOLIN SODIUM-DEXTROSE 2-4 GM/100ML-% IV SOLN
2.0000 g | INTRAVENOUS | Status: AC
  Administered 2020-12-29: 2 g via INTRAVENOUS
  Filled 2020-12-29: qty 100

## 2020-12-29 MED ORDER — CHLORHEXIDINE GLUCONATE CLOTH 2 % EX PADS: 6.0000 | MEDICATED_PAD | Freq: Once | CUTANEOUS | Status: DC

## 2020-12-29 MED ORDER — DEXAMETHASONE SODIUM PHOSPHATE 10 MG/ML IJ SOLN
INTRAMUSCULAR | Status: DC | PRN
  Administered 2020-12-29: 10 mg via INTRAVENOUS

## 2020-12-29 MED ORDER — LACTATED RINGERS IV SOLN: INTRAVENOUS | Status: DC | PRN

## 2020-12-29 MED ORDER — PROPOFOL 10 MG/ML IV BOLUS
INTRAVENOUS | Status: DC | PRN
  Administered 2020-12-29: 30 mg via INTRAVENOUS
  Administered 2020-12-29: 70 mg via INTRAVENOUS

## 2020-12-29 MED ORDER — 0.9 % SODIUM CHLORIDE (POUR BTL) OPTIME
TOPICAL | Status: DC | PRN
  Administered 2020-12-29: 1000 mL

## 2020-12-29 MED ORDER — ONDANSETRON HCL 4 MG/2ML IJ SOLN
INTRAMUSCULAR | Status: DC | PRN
  Administered 2020-12-29: 4 mg via INTRAVENOUS

## 2020-12-29 MED ORDER — OXYMETAZOLINE HCL 0.05 % NA SOLN
NASAL | Status: AC
  Filled 2020-12-29: qty 30

## 2020-12-29 MED ORDER — OXYMETAZOLINE HCL 0.05 % NA SOLN
NASAL | Status: DC | PRN
  Administered 2020-12-29: 1

## 2020-12-29 MED ORDER — MENTHOL 3 MG MT LOZG
LOZENGE | OROMUCOSAL | Status: AC
  Filled 2020-12-29: qty 45

## 2020-12-29 MED ORDER — CHLORHEXIDINE GLUCONATE 0.12 % MT SOLN
15.0000 mL | Freq: Once | OROMUCOSAL | Status: AC
  Administered 2020-12-29: 15 mL via OROMUCOSAL
  Filled 2020-12-29: qty 15

## 2020-12-29 MED ORDER — EPINEPHRINE HCL (NASAL) 0.1 % NA SOLN
NASAL | Status: AC
  Filled 2020-12-29: qty 30

## 2020-12-29 MED ORDER — MENTHOL 3 MG MT LOZG: 5.0000 | LOZENGE | OROMUCOSAL | Status: DC | PRN

## 2020-12-29 MED ORDER — SUCCINYLCHOLINE CHLORIDE 200 MG/10ML IV SOSY
PREFILLED_SYRINGE | INTRAVENOUS | Status: DC | PRN
  Administered 2020-12-29: 80 mg via INTRAVENOUS

## 2020-12-29 MED ORDER — MENTHOL 3 MG MT LOZG
9.0000 | LOZENGE | OROMUCOSAL | Status: DC | PRN
  Administered 2020-12-29: 27 mg via ORAL

## 2020-12-29 MED ORDER — MIDAZOLAM HCL 2 MG/2ML IJ SOLN
INTRAMUSCULAR | Status: AC
  Filled 2020-12-29: qty 2

## 2020-12-29 MED ORDER — FENTANYL CITRATE (PF) 100 MCG/2ML IJ SOLN
25.0000 ug | INTRAMUSCULAR | Status: DC | PRN
  Administered 2020-12-29: 25 ug via INTRAVENOUS

## 2020-12-29 MED ORDER — FENTANYL CITRATE (PF) 250 MCG/5ML IJ SOLN
INTRAMUSCULAR | Status: DC | PRN
  Administered 2020-12-29: 50 ug via INTRAVENOUS

## 2020-12-29 MED ORDER — ONDANSETRON HCL 4 MG/2ML IJ SOLN: 4.0000 mg | Freq: Once | INTRAMUSCULAR | Status: DC | PRN

## 2020-12-29 MED ORDER — CHLORHEXIDINE GLUCONATE 0.12 % MT SOLN
OROMUCOSAL | Status: AC
  Filled 2020-12-29: qty 15

## 2020-12-29 MED ORDER — MIDAZOLAM HCL 2 MG/2ML IJ SOLN
INTRAMUSCULAR | Status: DC | PRN
  Administered 2020-12-29: 1 mg via INTRAVENOUS

## 2020-12-29 MED ORDER — FENTANYL CITRATE (PF) 100 MCG/2ML IJ SOLN
INTRAMUSCULAR | Status: AC
  Filled 2020-12-29: qty 2

## 2020-12-29 MED ORDER — LIDOCAINE 2% (20 MG/ML) 5 ML SYRINGE
INTRAMUSCULAR | Status: DC | PRN
  Administered 2020-12-29: 40 mg via INTRAVENOUS

## 2020-12-29 MED ORDER — PROPOFOL 10 MG/ML IV BOLUS
INTRAVENOUS | Status: AC
  Filled 2020-12-29: qty 20

## 2020-12-29 SURGICAL SUPPLY — 27 items
BALLN PULM 15 16.5 18X75 (BALLOONS)
BALLOON PULM 15 16.5 18X75 (BALLOONS) IMPLANT
CANISTER SUCT 3000ML PPV (MISCELLANEOUS) ×2 IMPLANT
CNTNR URN SCR LID CUP LEK RST (MISCELLANEOUS) IMPLANT
CONT SPEC 4OZ STRL OR WHT (MISCELLANEOUS)
COVER BACK TABLE 60X90IN (DRAPES) ×2 IMPLANT
COVER MAYO STAND STRL (DRAPES) ×2 IMPLANT
COVER WAND RF STERILE (DRAPES) ×1 IMPLANT
DRAPE HALF SHEET 40X57 (DRAPES) ×2 IMPLANT
GAUZE 4X4 16PLY RFD (DISPOSABLE) ×1 IMPLANT
GAUZE SPONGE 4X4 12PLY STRL (GAUZE/BANDAGES/DRESSINGS) ×1 IMPLANT
GLOVE SS BIOGEL STRL SZ 7.5 (GLOVE) ×1 IMPLANT
GLOVE SUPERSENSE BIOGEL SZ 7.5 (GLOVE) ×1
GUARD TEETH (MISCELLANEOUS) IMPLANT
KIT BASIN OR (CUSTOM PROCEDURE TRAY) ×2 IMPLANT
KIT TURNOVER KIT B (KITS) ×2 IMPLANT
NDL HYPO 25GX1X1/2 BEV (NEEDLE) IMPLANT
NEEDLE HYPO 25GX1X1/2 BEV (NEEDLE) IMPLANT
NS IRRIG 1000ML POUR BTL (IV SOLUTION) ×2 IMPLANT
PAD ARMBOARD 7.5X6 YLW CONV (MISCELLANEOUS) ×4 IMPLANT
PATTIES SURGICAL .5 X3 (DISPOSABLE) IMPLANT
SOL ANTI FOG 6CC (MISCELLANEOUS) IMPLANT
SOLUTION ANTI FOG 6CC (MISCELLANEOUS) ×1
SPECIMEN JAR SMALL (MISCELLANEOUS) ×2 IMPLANT
SURGILUBE 2OZ TUBE FLIPTOP (MISCELLANEOUS) IMPLANT
TOWEL GREEN STERILE FF (TOWEL DISPOSABLE) ×2 IMPLANT
TUBE CONNECTING 12X1/4 (SUCTIONS) ×2 IMPLANT

## 2020-12-29 NOTE — Discharge Instructions (Addendum)
Drink plenty of liquids . Diet as tolerated. Use throat lozenges for sore throat along with tylenol and ibuprofen Call Dr Pollie Friar office next Tuesday concerning results of the biopsy   [phone]

## 2020-12-29 NOTE — Brief Op Note (Signed)
12/29/2020  10:31 AM  PATIENT:  Jeffrey Snow  65 y.o. 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,NEOPLASM OF UNCERTAIN BEHAVIOR OF VALLECULA  PROCEDURE:  Procedure(s): DIRECT LARYNGOSCOPY WITH BIOPSY (N/A)  SURGEON:  Surgeon(s) and Role:    Rozetta Nunnery, MD - Primary  PHYSICIAN ASSISTANT:   ASSISTANTS: none   ANESTHESIA:   general  EBL:  1 mL   BLOOD ADMINISTERED:none  DRAINS: none   LOCAL MEDICATIONS USED:  NONE  SPECIMEN:  Source of Specimen:  epiglottis  DISPOSITION OF SPECIMEN:  PATHOLOGY  COUNTS:  YES  TOURNIQUET:  * No tourniquets in log *  DICTATION: .Other Dictation: Dictation Number [id number]  PLAN OF CARE: Discharge to home after PACU  PATIENT DISPOSITION:  PACU - hemodynamically stable.   Delay start of Pharmacological VTE agent (>24hrs) due to surgical blood loss or risk of bleeding: yes

## 2020-12-29 NOTE — Anesthesia Procedure Notes (Signed)
Procedure Name: Intubation Date/Time: 12/29/2020 9:46 AM Performed by: Clearnce Sorrel, CRNA Pre-anesthesia Checklist: Patient identified, Emergency Drugs available, Suction available, Patient being monitored and Timeout performed Patient Re-evaluated:Patient Re-evaluated prior to induction Oxygen Delivery Method: Circle system utilized Preoxygenation: Pre-oxygenation with 100% oxygen Induction Type: IV induction and Rapid sequence Laryngoscope Size: Mac and 4 Grade View: Grade I Tube type: Oral Tube size: 6.0 (per surgeon's request) mm Number of attempts: 1 Airway Equipment and Method: Stylet Placement Confirmation: ETT inserted through vocal cords under direct vision,  positive ETCO2 and breath sounds checked- equal and bilateral Secured at: 23 cm Tube secured with: Tape Dental Injury: Teeth and Oropharynx as per pre-operative assessment

## 2020-12-29 NOTE — Interval H&P Note (Signed)
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  The various methods of treatment have been discussed with the patient and family. After consideration of risks, benefits and other options for treatment, the patient has consented to  Procedure(s): DIRECT LARYNGOSCOPY WITH BIOPSY (N/A) as a surgical intervention.  The patient's history has been reviewed, patient examined, no change in status, stable for surgery.  I have reviewed the patient's chart and labs.  Questions were answered to the patient's satisfaction.     [name]

## 2020-12-29 NOTE — Anesthesia Postprocedure Evaluation (Signed)
Anesthesia Post Note  Patient: Antwine Agosto Skillern  Procedure(s) Performed: DIRECT LARYNGOSCOPY WITH BIOPSY (N/A Mouth)     Patient location during evaluation: PACU Anesthesia Type: General Level of consciousness: awake and alert, oriented and patient cooperative Pain management: pain level controlled Vital Signs Assessment: post-procedure vital signs reviewed and stable Respiratory status: spontaneous breathing, nonlabored ventilation and respiratory function stable Cardiovascular status: blood pressure returned to baseline and stable Postop Assessment: no apparent nausea or vomiting Anesthetic complications: no   No complications documented.  Last Vitals:  Vitals:   12/29/20 1055 12/29/20 1110  BP: 140/66 139/65  Pulse: 74 79  Resp: 12 10  Temp:  36.9 C  SpO2: 98% 99%    Last Pain:  Vitals:   12/29/20 1110  TempSrc:   PainSc: [place]

## 2020-12-29 NOTE — H&P (Signed)
PREOPERATIVE H&P  Chief Complaint: Sore throat and hoarseness  HPI: Jeffrey Snow is a 65 y.o. male who presents for evaluation of sore throat with CT scan showing preepiglottic mass. In office patient has supraglottic abnormality. He's taken to the OR for DL and Bx  Past Medical History:  Diagnosis Date  . Abnormal MRI of abdomen, liver  04/21/2014  . Anxiety   . Cirrhosis, alcoholic ([place]) 01/24/5283  . Claudication of left lower extremity ([place]) 10/2020  . Depression   . [place] (hepatocellular carcinoma) ([place])   . Hearing deficit   . Hepatitis C   . Hypertension   . Liver mass   . Panic attack    Past Surgical History:  Procedure Laterality Date  . ABDOMINAL AORTAGRAM N/A 12/27/2013   Procedure: ABDOMINAL Maxcine Ham;  Surgeon: Conrad Garfield, MD;  Location: [facility] CATH LAB;  Service: Cardiovascular;  Laterality: N/A;  . AORTOGRAM    . COSMETIC SURGERY     facial reconstructive surgery  . ESOPHAGOGASTRODUODENOSCOPY N/A 12/17/2014   Procedure: ESOPHAGOGASTRODUODENOSCOPY (EGD);  Surgeon: Missy Sabins, MD;  Location: [facility] ENDOSCOPY;  Service: Endoscopy;  Laterality: N/A;  . ESOPHAGOGASTRODUODENOSCOPY N/A 09/05/2015   Procedure: ESOPHAGOGASTRODUODENOSCOPY (EGD);  Surgeon: Wonda Horner, MD;  Location: [facility] ENDOSCOPY;  Service: Endoscopy;  Laterality: N/A;  . IR ANGIOGRAM EXTREMITY RIGHT  03/21/2020  . IR ANGIOGRAM SELECTIVE EACH ADDITIONAL VESSEL  03/10/2020  . IR ANGIOGRAM SELECTIVE EACH ADDITIONAL VESSEL  03/10/2020  . IR ANGIOGRAM SELECTIVE EACH ADDITIONAL VESSEL  03/10/2020  . IR ANGIOGRAM SELECTIVE EACH ADDITIONAL VESSEL  03/10/2020  . IR ANGIOGRAM SELECTIVE EACH ADDITIONAL VESSEL  03/10/2020  . IR ANGIOGRAM SELECTIVE EACH ADDITIONAL VESSEL  03/21/2020  . IR ANGIOGRAM SELECTIVE EACH ADDITIONAL VESSEL  03/21/2020  . IR ANGIOGRAM SELECTIVE EACH ADDITIONAL VESSEL  03/21/2020  . IR ANGIOGRAM SELECTIVE EACH ADDITIONAL VESSEL  03/21/2020  . IR ANGIOGRAM SELECTIVE EACH ADDITIONAL VESSEL  03/21/2020  . IR  ANGIOGRAM SELECTIVE EACH ADDITIONAL VESSEL  04/18/2020  . IR ANGIOGRAM SELECTIVE EACH ADDITIONAL VESSEL  04/18/2020  . IR ANGIOGRAM VISCERAL SELECTIVE  03/10/2020  . IR ANGIOGRAM VISCERAL SELECTIVE  03/21/2020  . IR ANGIOGRAM VISCERAL SELECTIVE  04/18/2020  . IR EMBO ARTERIAL NOT HEMORR HEMANG INC GUIDE ROADMAPPING  03/10/2020  . IR EMBO TUMOR ORGAN ISCHEMIA INFARCT INC GUIDE ROADMAPPING  03/21/2020  . IR EMBO TUMOR ORGAN ISCHEMIA INFARCT INC GUIDE ROADMAPPING  04/18/2020  . IR GENERIC HISTORICAL  01/19/2015   IR RADIOLOGIST EVAL & MGMT 01/19/2015 Jacqulynn Cadet, MD [facility] INTERV RAD  . IR GENERIC HISTORICAL  07/23/2016   IR RADIOLOGIST EVAL & MGMT 07/23/2016 Jacqulynn Cadet, MD [facility] INTERV RAD  . IR GENERIC HISTORICAL  05/30/2015   IR RADIOLOGIST EVAL & MGMT 05/30/2015 [facility] INTERV RAD  . IR GENERIC HISTORICAL  10/04/2014   IR RADIOLOGIST EVAL & MGMT 10/04/2014 Jacqulynn Cadet, MD [facility] INTERV RAD  . IR GENERIC HISTORICAL  02/11/2017   IR RADIOLOGIST EVAL & MGMT 02/11/2017 Jacqulynn Cadet, MD [facility] INTERV RAD  . IR PARACENTESIS  10/23/2020  . IR RADIOLOGIST EVAL & MGMT  08/13/2017  . IR RADIOLOGIST EVAL & MGMT  08/11/2018  . IR RADIOLOGIST EVAL & MGMT  02/09/2019  . IR RADIOLOGIST EVAL & MGMT  02/10/2020  . IR RADIOLOGIST EVAL & MGMT  05/11/2020  . IR RADIOLOGIST EVAL & MGMT  08/09/2020  . IR RADIOLOGIST EVAL & MGMT  12/27/2020  . IR US GUIDE VASC ACCESS LEFT  03/10/2020  . IR US GUIDE  VASC ACCESS LEFT  03/21/2020  . IR US GUIDE VASC ACCESS LEFT  04/18/2020  . IR US GUIDE VASC ACCESS RIGHT  03/21/2020  . LOWER EXTREMITY ANGIOGRAM Bilateral 12/27/2013   Procedure: LOWER EXTREMITY ANGIOGRAM;  Surgeon: Conrad Nunn, MD;  Location: [facility] CATH LAB;  Service: Cardiovascular;  Laterality: Bilateral;   Social History   Socioeconomic History  . Marital status: Single    Spouse name: Not on file  . Number of children: 3  . Years of education: Not on file  . Highest education level: Not on file  Occupational History     Employer: [employer]  Tobacco Use  . Smoking status: Former Smoker    Packs/day: 0.50    Years: 35.00    Pack years: 17.50    Quit date: 08/21/2011    Years since quitting: 9.3  . Smokeless tobacco: Never Used  Vaping Use  . Vaping Use: Never used  Substance and Sexual Activity  . Alcohol use: Not on file    Comment: Occasional beer on the weekends  . Drug use: No    Types: Cocaine    Comment: abused cocaine in the 80's  . Sexual activity: Not Currently  Other Topics Concern  . Not on file  Social History Narrative   Single, never married.  Lives alone.  3 daughters, 16 grandchildren who are not very involved in the patient's care.  Quit smoking and heavy drinking 3.5 years ago.    Social Determinants of Health   Financial Resource Strain: High Risk  . Difficulty of Paying Living Expenses: Hard  Food Insecurity: No Food Insecurity  . Worried About Charity fundraiser in the Last Year: Never true  . Ran Out of Food in the Last Year: Never true  Transportation Needs: No Transportation Needs  . Lack of Transportation (Medical): No  . Lack of Transportation (Non-Medical): No  Physical Activity: Inactive  . Days of Exercise per Week: 0 days  . Minutes of Exercise per Session: 0 min  Stress: Stress Concern Present  . Feeling of Stress : Very much  Social Connections: Not on file   Family History  Problem Relation Age of Onset  . Diabetes Father   . Diabetes Brother   . Diabetes Brother    Allergies  Allergen Reactions  . Aspirin Other (See Comments)    GI bleeding  . Nsaids Other (See Comments)    Gi bleeding  . Folic Acid Other (See Comments)    Makes stomach feel funny  . Oxycodone Itching and Other (See Comments)    Cause insomnia   Prior to Admission medications   Medication Sig Start Date End Date Taking? Authorizing Provider  albuterol (VENTOLIN HFA) 108 (90 Base) MCG/ACT inhaler Inhale 2 puffs into the lungs every 6 (six) hours as needed for wheezing or  shortness of breath. 07/04/20  Yes Azzie Glatter, FNP  ALPRAZolam Duanne Moron) 1 MG tablet Take 1 mg by mouth 2 (two) times daily.   Yes [provider]  bismuth subsalicylate (PEPTO BISMOL) 262 MG/15ML suspension Take 30 mLs by mouth every 6 (six) hours as needed for diarrhea or loose stools.   Yes [provider]  furosemide (LASIX) 20 MG tablet Take 1 tablet (20 mg total) by mouth daily. 11/22/20  Yes Azzie Glatter, FNP  gabapentin (NEURONTIN) 300 MG capsule Take 1 capsule (300 mg total) by mouth 3 (three) times daily. 06/06/20  Yes Azzie Glatter, FNP  loperamide (IMODIUM) 2  MG capsule Take 1-2 capsules (2-4 mg total) by mouth as needed for diarrhea or loose stools. 11/30/20  Yes Alla Feeling, NP  ondansetron (ZOFRAN) 8 MG tablet Take 1 tablet (8 mg total) by mouth every 8 (eight) hours as needed for nausea or vomiting. 11/30/20  Yes Alla Feeling, NP  pantoprazole (PROTONIX) 40 MG tablet TAKE 1 TABLET(40 MG) BY MOUTH TWICE DAILY BEFORE A MEAL Patient taking differently: Take 40 mg by mouth 2 (two) times daily. 11/22/20  Yes Azzie Glatter, FNP  potassium chloride SA (KLOR-CON) 20 MEQ tablet Take 1 tablet (20 mEq total) by mouth daily. 12/22/20  Yes Truitt Merle, MD  spironolactone (ALDACTONE) 25 MG tablet Take 3 tablets (75 mg total) by mouth daily. 10/07/20  Yes Nuala Alpha A, PA-C     Positive ROS: negative  All other systems have been reviewed and were otherwise negative with the exception of those mentioned in the HPI and as above.  Physical Exam: Vitals:   12/29/20 0816  BP: (!) 142/62  Pulse: 76  Resp: 20  Temp: 97.9 F (36.6 C)  SpO2: 97%    General: Alert, no acute distress Oral: Normal oral mucosa and tonsils IDL demonstates supraglottic lesion on the laryngeal surface of the epiglottis. Norma VC mobility Nasal: Clear nasal passages Neck: No palpable adenopathy or thyroid nodules Ear: Ear canal is clear with normal appearing  TMs Cardiovascular: Regular rate and rhythm, no murmur.  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

## 2020-12-29 NOTE — Transfer of Care (Signed)
Immediate Anesthesia Transfer of Care Note  Patient: Jeffrey Snow  Procedure(s) Performed: DIRECT LARYNGOSCOPY WITH BIOPSY (N/A Mouth)  Patient Location: PACU  Anesthesia Type:General  Level of Consciousness: awake, alert  and oriented  Airway & Oxygen Therapy: Patient Spontanous Breathing  Post-op Assessment: Report given to RN and Post -op Vital signs reviewed and stable  Post vital signs: Reviewed and stable  Last Vitals:  Vitals Value Taken Time  BP 136/69 12/29/20 1040  Temp    Pulse 78 12/29/20 1040  Resp 13 12/29/20 1040  SpO2 100 % 12/29/20 1040  Vitals shown include unvalidated device data.  Last Pain:  Vitals:   12/29/20 0816  TempSrc: Oral  PainSc:          Complications: No complications documented.

## 2020-12-29 NOTE — Op Note (Signed)
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  OPERATION PERFORMED:  Direct laryngoscopy and biopsy.  SURGEON:  [name], MD  ANESTHESIA:  General endotracheal.  COMPLICATIONS:  None.  BRIEF CLINICAL NOTE:  The patient is a 65 year old gentleman who is alcoholic and smokes.  He has had a sore throat for a couple of months and mild hoarseness.  He had a CT scan performed a little over a month ago that showed a preepiglottic mass.  On  exam in the office, the patient has what appears to be a supraglottic cancer or epiglottic cancer arising from the laryngeal surface of the epiglottis extending into the preepiglottic space according to the CT scan.  The patient also has a history of  hepatic cancer that is followed by oncology.  He is taken to the operating room at this time for a direct laryngoscopy and biopsy.  DESCRIPTION OF PROCEDURE:  After adequate endotracheal anesthesia, direct laryngoscopy was performed.  The patient had mild bleeding after intubation with a #6 endotracheal tube.  The bleeding arose from the vallecula area.  There was some slight  fullness in the vallecula area, but no gross ulceration.  Examination of the vocal cords, which were deep were clear; however, he had supraglottic fullness of the false cord on inspection on the left side as well as some superficial changes of the mucosa  of the epiglottis on the laryngeal surface predominantly on the left side that extended up to the AE fold.  The AE fold appeared to be involved more on the left side, the right side looked actually reasonably clear.  Photos were obtained and then   biopsies of the mass obtained from the laryngeal surface of the epiglottis were performed.  He had mild bleeding that was controlled with cotton pledgets soaked in Afrin.  This completed the procedure.  The patient was subsequently awoken from anesthesia  and transferred to the recovery room postoperatively doing well.  DISPOSITION:  The patient is discharged home later this morning.  We will plan on scheduling the patient for a PET scan and present him at tumor board next week.  HN/NUANCE  D:12/29/2020 T:12/29/2020 JOB:[id number]

## 2020-12-30 ENCOUNTER — Encounter (HOSPITAL_COMMUNITY): Payer: Self-pay | Admitting: Otolaryngology

## 2021-01-01 ENCOUNTER — Telehealth (INDEPENDENT_AMBULATORY_CARE_PROVIDER_SITE_OTHER): Payer: Self-pay

## 2021-01-01 LAB — SURGICAL PATHOLOGY

## 2021-01-01 NOTE — Telephone Encounter (Signed)
Called patient concerning results of his DL and biopsy.  This showed invasive squamous cell carcinoma.  I reviewed the biopsy with the patient and discussed that he has a PET scan scheduled for 01/10/2021.  Discussed that he will subsequently be contacted by radiation oncology at the cancer center for further evaluation and treatment of his cancer. Also briefly discussed the patient with his oncologist Dr. Burr Medico who treated his hepatic cancer.

## 2021-01-05 ENCOUNTER — Telehealth: Payer: Self-pay | Admitting: Hematology and Oncology

## 2021-01-05 ENCOUNTER — Other Ambulatory Visit: Payer: Self-pay

## 2021-01-05 DIAGNOSIS — C321 Malignant neoplasm of supraglottis: Secondary | ICD-10-CM

## 2021-01-05 NOTE — Progress Notes (Signed)
Oncology Nurse Navigator Documentation  I left a voice mail with [name] introducing myself as his Nursing Navigator at [facility]. I explained that I assist patients with a diagnosis of cancer in their head and neck area manage appointments, answer questions, and help with most concerns as needed.  I provided him with my direct contact information and asked that he call me to discuss upcoming appointments and any questions he may have.   Harlow Asa RN, BSN, OCN Head & Neck Oncology Nurse [place] at [facility] Phone # [phone]  Fax # [phone]

## 2021-01-05 NOTE — Progress Notes (Signed)
mblatory

## 2021-01-05 NOTE — Telephone Encounter (Signed)
Received a new pt referral from Dr. Radene Journey for dx of squamous cell carcinoma of epiglottis. [name] has been scheduled to see Dr. Chryl Heck on 1/28 at 1120am. I cld and lft the appt date and time on the pt's vm.

## 2021-01-08 ENCOUNTER — Other Ambulatory Visit: Payer: Self-pay | Admitting: Family Medicine

## 2021-01-08 ENCOUNTER — Telehealth: Payer: Self-pay | Admitting: Family Medicine

## 2021-01-08 DIAGNOSIS — K7031 Alcoholic cirrhosis of liver with ascites: Secondary | ICD-10-CM

## 2021-01-08 NOTE — Telephone Encounter (Signed)
Done

## 2021-01-10 ENCOUNTER — Ambulatory Visit (HOSPITAL_COMMUNITY): Admission: RE | Admit: 2021-01-10 | Payer: Medicare Other | Source: Ambulatory Visit

## 2021-01-12 ENCOUNTER — Ambulatory Visit: Payer: Medicare Other | Admitting: Hematology and Oncology

## 2021-01-12 ENCOUNTER — Other Ambulatory Visit: Payer: Medicare Other

## 2021-01-15 ENCOUNTER — Other Ambulatory Visit: Payer: Self-pay | Admitting: Hematology

## 2021-01-15 ENCOUNTER — Telehealth: Payer: Self-pay | Admitting: *Deleted

## 2021-01-15 ENCOUNTER — Ambulatory Visit (HOSPITAL_COMMUNITY): Payer: Medicare Other

## 2021-01-15 DIAGNOSIS — C220 Liver cell carcinoma: Secondary | ICD-10-CM

## 2021-01-15 NOTE — Telephone Encounter (Signed)
Received vm from pt stating that he would like a strong fluid removing pill called in to [facility].  He reports abdomen full of fluid & big &  legs are swollen & he is very unhappy.  He states he had 5 liters drawn 12/22/20.  He is up to BR all night with diarrhea.  Message to Dr Burr Medico.

## 2021-01-15 NOTE — Progress Notes (Addendum)
Head and Neck Cancer Location of Tumor / Histology: Squamous cell carcinoma of epiglottis  Patient presented with symptoms of: a sore throat for a couple of months and mild hoarseness.  He had a CT scan performed a little over a month ago that showed a preepiglottic mass.   Biopsies revealed:  12/29/2020 FINAL MICROSCOPIC DIAGNOSIS:  A. EPIGLOTTIS, BIOPSY:  - Invasive squamous cell carcinoma.   Nutrition Status Yes No Comments  Weight changes? '[x]'  '[]'  From extra fluid from ascites  Swallowing concerns? '[x]'  '[]'  Can only tolerate soft/pureed foods or soups  PEG? '[]'  '[x]'  Per Dr. Jacqulynn Cadet: Ascites may prevent placement of a g-tube in the future   Referrals Yes No Comments  Social Work? '[x]'  '[]'    Dentistry? '[x]'  '[]'    Swallowing therapy? '[x]'  '[]'    Nutrition? '[x]'  '[]'    Med/Onc? '[x]'  '[]'  Dr. Arletha Pili Iruku 01/24/2021 (already established with Dr. Truitt Merle)   Safety Issues Yes No Comments  Prior radiation? '[x]'  '[]'  underwent transarterial radio embolization with right lobar treatmenton 03/21/2020 by Dr. Jacqulynn Cadet  Pacemaker/ICD? '[]'  '[x]'    Possible current pregnancy? '[]'  '[x]'  N/A  Is the patient on methotrexate? '[]'  '[x]'     Tobacco/Marijuana/Snuff/ETOH use: Denies any current alcohol or tobacco use. Former smoker (1/2 pack/day for ~35 years--quit ~2012) and former drinker (quit ~2012). Has a history History of coacine use in the 33s.   Past/Anticipated interventions by otolaryngology, if any:  12/29/2020 Dr. [name] Direct laryngoscopy and biopsy.  Past/Anticipated interventions by medical oncology, if any:  Scheduled for consult with Dr. Benay Pike on 01/24/2021  Already established care with Dr. Truitt Merle for hepatocellular carcinoma. 12/22/2020 1. Recurrentmultifocal hepatocellular carcinoma -He was initially diagnosed on 04/25/14. He was treated with microwave ablation in 05/2014.He has longstanding history of alcohol and hepatitis C related liver cirrhosis. -He had  been seen by Dr McColloughwithMRIs since then. He was found to have multifocalcancer recurrence in liver on 02/01/20 MRI with elevated tumor marker again. -Given his multifocal cancer in liver he is not eligible for liver surgery or transplant. His cancer is no longer curable but still treatable to control his disease and prolong his life. -He was treated with Y90 on 03/21/20 and again on 04/18/20.  -He presented in 11/2020 with significant ascites andRUQpain. Alk phos is elevated, T biliactuallylower than his baseline. The AFP 11.3.  -He underwent Paracentesis on 12/20/20 and cytology was negative for malignancy. His MRI Liver from 12/20/20 showed No enhancing lesion within the liver to suggest recurrence of hepatocellular carcinoma or new HCC lesions. I personally reviewed with patient today  2. Liver Cirrhosis, Hep C (treated), Ascites, LE Edema   -He quit drinking alcohol 10-15 years ago after drinking 8-10 beers a day. He also quit smoking at the same time after smoking for 35 years. He notes he was negative for prior lung cancer screening.  -He was diagnosed with liver cirrhosis by Dr Jimmye Norman about 10 years ago  -His 2016 upper Endoscopies by Dr Penelope Coop and Dr Amedeo Plenty were negative for Esophageal Varices.  -He was treated with Harvoni for his Hep C by Dr Amedeo Plenty. He was diagnosed after his Cirrhosis.  -He was seen by liver clinic NP Heide Spark before -He presented in 11/2020 with significant ascites. He underwent paracentesis on 12/20/20 with 5 liters removed. Cytology was negative for malignancy.  -He now has more pronounced LE edema, L>R, with skin erythema for the past 2 weeks. He is on Lasix 30m and Spirolactone 731mcurrently. I  recommend he increase Lasix to BID (12/22/20). I will call in potassium to take with lasix.  -I also recommend he f/u with Dr Penelope Coop or [facility] about is cirrhosis and ascites management   PLAN: -I recommend him to increase lasix and I called in potassium today  -Lab  and f/u in 3 months  -will email Heide Spark or his GI [name] about his ascites management   Current Complaints / other details:   Scheduled for PET scan on 01/22/2021. Currently lives alone but has a daughter who helps with some chores and upkeep. Currently struggling with ambulation and ADLs due to increase in abdominal and lower extremity swelling

## 2021-01-16 ENCOUNTER — Ambulatory Visit
Admission: RE | Admit: 2021-01-16 | Discharge: 2021-01-16 | Disposition: A | Discharge status: Home / Self Care | Payer: Medicare Other | Source: Ambulatory Visit | Attending: Radiation Oncology | Admitting: Radiation Oncology

## 2021-01-16 ENCOUNTER — Encounter: Payer: Self-pay | Admitting: Radiation Oncology

## 2021-01-16 ENCOUNTER — Ambulatory Visit (HOSPITAL_COMMUNITY): Payer: Medicare Other

## 2021-01-16 ENCOUNTER — Other Ambulatory Visit: Payer: Self-pay

## 2021-01-16 DIAGNOSIS — C220 Liver cell carcinoma: Secondary | ICD-10-CM

## 2021-01-16 DIAGNOSIS — C321 Malignant neoplasm of supraglottis: Secondary | ICD-10-CM

## 2021-01-16 NOTE — Telephone Encounter (Signed)
Jeffrey Snow, I requested paracentesis yesterday but it has not been scheduled, please schedule it for him this week or early next week. Please let pt know that I referred him back to liver clinic [name] office but he did not show. [name] him call them back and reschedule his appointment. Also schedule him to see me later this week to adjust his diuretics, if he is not able to see Dawn this or next week. Lab appointment before OV, thanks   Truitt Merle MD

## 2021-01-16 NOTE — Progress Notes (Signed)
Oncology Nurse Navigator Documentation  . Met with patient during initial telephone consult with Jeffrey Snow.  . Further introduced myself as his/their Navigator, explained my role as a member of the Care Team. . Assisted with post-consult appt scheduling. Marland Kitchen He verbalized understanding of information provided. . I will contact IR to help get the paracentesis scheduled that was ordered by Dr. Burr Medico yesterday.  . I encouraged him to call with questions/concerns moving forward.  Harlow Asa, RN, BSN, OCN Head & Neck Oncology Nurse [place] at [place] [phone]

## 2021-01-16 NOTE — Progress Notes (Signed)
Radiation Oncology         [phone] ________________________________  Initial Outpatient Consultation by telephone.  The patient opted for telemedicine to maximize safety during the pandemic.  MyChart video was not obtainable.   Name: Jeffrey Snow MRN: [id number]  Date: 01/16/2021  DOB: 1956-03-17  UX:NATFTD, Ellie Lunch, FNP  Rozetta Nunnery, *   REFERRING PHYSICIAN: Rozetta Nunnery, *  DIAGNOSIS:    ICD-10-CM   1. Squamous cell carcinoma of epiglottis (HCC)  C32.1   2. Malignant neoplasm of supraglottis (HCC)  C32.1    Cancer Staging Malignant neoplasm of supraglottis ([place]) Staging form: Larynx - Supraglottis, AJCC 8th Edition - Clinical stage from 01/16/2021: Stage III (cT3, cN0, cM0) - Signed by Eppie Gibson, MD on 01/19/2021 Stage prefix: Initial diagnosis  CHIEF COMPLAINT: Here to discuss management of epiglottic cancer  HISTORY OF PRESENT ILLNESS::Jeffrey Snow is a 65 y.o. male with a history of recurrent multifocal hepatocellular carcinoma, liver cirrhosis, ascites, and treated hep C, who presented to the ED on 12/062021 with two-week history of sore throat and difficulty swallowing.  Pertinent imaging thus far includes a soft tissue neck CT scan on 11/20/2020 that revealed soft tissue thickening along the lingual surface of the epiglottis extending into the preepiglottic fat to the anterior commissure. A mass was suspected and direct visual inspection was recommended. Additionally, there was an irregularity of the adjacent thyroid cartilage bilaterally that may be incidental or reflect involvement.  I have personally reviewed his imaging.  PET scan is pending for next week. He has been discussed at tumor board.  Subsequently, the patient saw Dr. Lucia Gaskins, who performed a direct laryngoscopy and biopsy on 12/29/2020. Pathology from the procedure revealed invasive squamous cell carcinoma. Per his op note, "There was some slight  fullness in the vallecula  area, but no gross ulceration.  Examination of the vocal cords, which were deep were clear; however, he had supraglottic fullness of the false cord on inspection on the left side as well as some superficial changes of the mucosa  of the epiglottis on the laryngeal surface predominantly on the left side that extended up to the AE fold.  The AE fold appeared to be involved more on the left side, the right side looked actually reasonably clear."  Swallowing issues, if any: He can only tolerate soft and pured foods or soups.  Weight Changes: Weight gain from extra fluid related to his ascites.  This is his main complaint today.  He reports that his stomach is extremely distended and the extra weight from the fluid of his ascites makes it terribly difficult to ambulate; he is waiting for paracentesis to be scheduled  Substance abuse history: Denies any current alcohol or tobacco use. Former smoker (1/2 pack/day for ~35 years--quit ~2012) and former drinker (quit ~2012). Has a history History of cocaine use in the 80s.   He lives alone but has a daughter who helps with chores and upkeep.  He has difficulty with his activities of daily living because of his lower extremity swelling and abdominal swelling.  He reports that he has had his house assessed and prepared by home health/occupational therapy to help with his ADLs and safety  PREVIOUS RADIATION THERAPY: No history of external beam radiation though he did undergo treatment with Y90 Radio embolizatinon on 03/21/20 and 04/18/20 to his liver  PAST MEDICAL HISTORY:  has a past medical history of Abnormal MRI of abdomen, liver  (04/21/2014), Anxiety, Cirrhosis, alcoholic ([place]) (02/15/2024),  Claudication of left lower extremity ([place]) (10/2020), Depression, HCC (hepatocellular carcinoma) ([place]), Hearing deficit, Hepatitis C, Hypertension, Liver mass, and Panic attack.    PAST SURGICAL HISTORY: Past Surgical History:  Procedure Laterality Date  . ABDOMINAL  AORTAGRAM N/A 12/27/2013   Procedure: ABDOMINAL Maxcine Ham;  Surgeon: Conrad Ferrelview, MD;  Location: [facility] CATH LAB;  Service: Cardiovascular;  Laterality: N/A;  . AORTOGRAM    . COSMETIC SURGERY     facial reconstructive surgery  . DIRECT LARYNGOSCOPY N/A 12/29/2020   Procedure: DIRECT LARYNGOSCOPY WITH BIOPSY;  Surgeon: Rozetta Nunnery, MD;  Location: [facility] OR;  Service: ENT;  Laterality: N/A;  . ESOPHAGOGASTRODUODENOSCOPY N/A 12/17/2014   Procedure: ESOPHAGOGASTRODUODENOSCOPY (EGD);  Surgeon: Missy Sabins, MD;  Location: [facility];  Service: Endoscopy;  Laterality: N/A;  . ESOPHAGOGASTRODUODENOSCOPY N/A 09/05/2015   Procedure: ESOPHAGOGASTRODUODENOSCOPY (EGD);  Surgeon: Wonda Horner, MD;  Location: [facility];  Service: Endoscopy;  Laterality: N/A;  . IR ANGIOGRAM EXTREMITY RIGHT  03/21/2020  . IR ANGIOGRAM SELECTIVE EACH ADDITIONAL VESSEL  03/10/2020  . IR ANGIOGRAM SELECTIVE EACH ADDITIONAL VESSEL  03/10/2020  . IR ANGIOGRAM SELECTIVE EACH ADDITIONAL VESSEL  03/10/2020  . IR ANGIOGRAM SELECTIVE EACH ADDITIONAL VESSEL  03/10/2020  . IR ANGIOGRAM SELECTIVE EACH ADDITIONAL VESSEL  03/10/2020  . IR ANGIOGRAM SELECTIVE EACH ADDITIONAL VESSEL  03/21/2020  . IR ANGIOGRAM SELECTIVE EACH ADDITIONAL VESSEL  03/21/2020  . IR ANGIOGRAM SELECTIVE EACH ADDITIONAL VESSEL  03/21/2020  . IR ANGIOGRAM SELECTIVE EACH ADDITIONAL VESSEL  03/21/2020  . IR ANGIOGRAM SELECTIVE EACH ADDITIONAL VESSEL  03/21/2020  . IR ANGIOGRAM SELECTIVE EACH ADDITIONAL VESSEL  04/18/2020  . IR ANGIOGRAM SELECTIVE EACH ADDITIONAL VESSEL  04/18/2020  . IR ANGIOGRAM VISCERAL SELECTIVE  03/10/2020  . IR ANGIOGRAM VISCERAL SELECTIVE  03/21/2020  . IR ANGIOGRAM VISCERAL SELECTIVE  04/18/2020  . IR EMBO ARTERIAL NOT HEMORR HEMANG INC GUIDE ROADMAPPING  03/10/2020  . IR EMBO TUMOR ORGAN ISCHEMIA INFARCT INC GUIDE ROADMAPPING  03/21/2020  . IR EMBO TUMOR ORGAN ISCHEMIA INFARCT INC GUIDE ROADMAPPING  04/18/2020  . IR GENERIC HISTORICAL  01/19/2015   IR RADIOLOGIST  EVAL & MGMT 01/19/2015 Jacqulynn Cadet, MD [facility] INTERV RAD  . IR GENERIC HISTORICAL  07/23/2016   IR RADIOLOGIST EVAL & MGMT 07/23/2016 Jacqulynn Cadet, MD [facility] INTERV RAD  . IR GENERIC HISTORICAL  05/30/2015   IR RADIOLOGIST EVAL & MGMT 05/30/2015 [facility] INTERV RAD  . IR GENERIC HISTORICAL  10/04/2014   IR RADIOLOGIST EVAL & MGMT 10/04/2014 Jacqulynn Cadet, MD [facility] INTERV RAD  . IR GENERIC HISTORICAL  02/11/2017   IR RADIOLOGIST EVAL & MGMT 02/11/2017 Jacqulynn Cadet, MD [facility] INTERV RAD  . IR PARACENTESIS  10/23/2020  . IR RADIOLOGIST EVAL & MGMT  08/13/2017  . IR RADIOLOGIST EVAL & MGMT  08/11/2018  . IR RADIOLOGIST EVAL & MGMT  02/09/2019  . IR RADIOLOGIST EVAL & MGMT  02/10/2020  . IR RADIOLOGIST EVAL & MGMT  05/11/2020  . IR RADIOLOGIST EVAL & MGMT  08/09/2020  . IR RADIOLOGIST EVAL & MGMT  12/27/2020  . IR US GUIDE VASC ACCESS LEFT  03/10/2020  . IR US GUIDE VASC ACCESS LEFT  03/21/2020  . IR US GUIDE VASC ACCESS LEFT  04/18/2020  . IR US GUIDE VASC ACCESS RIGHT  03/21/2020  . LOWER EXTREMITY ANGIOGRAM Bilateral 12/27/2013   Procedure: LOWER EXTREMITY ANGIOGRAM;  Surgeon: Conrad [place], MD;  Location: [facility] CATH LAB;  Service: Cardiovascular;  Laterality: Bilateral;    FAMILY HISTORY: family  history includes Diabetes in his brother, brother, and father.  SOCIAL HISTORY:  reports that he quit smoking about 9 years ago. He has a 17.50 pack-year smoking history. He has never used smokeless tobacco. He reports previous alcohol use. He reports that he does not use drugs.  ALLERGIES: Aspirin, Nsaids, Folic acid, and Oxycodone  MEDICATIONS:  Current Outpatient Medications  Medication Sig Dispense Refill  . albuterol (VENTOLIN HFA) 108 (90 Base) MCG/ACT inhaler Inhale 2 puffs into the lungs every 6 (six) hours as needed for wheezing or shortness of breath. 18 g 11  . ALPRAZolam (XANAX) 1 MG tablet Take 1 mg by mouth 2 (two) times daily.    Marland Kitchen bismuth subsalicylate (PEPTO BISMOL) 262 MG/15ML suspension  Take 30 mLs by mouth every 6 (six) hours as needed for diarrhea or loose stools.    . furosemide (LASIX) 20 MG tablet Take 1 tablet (20 mg total) by mouth daily. 90 tablet 3  . gabapentin (NEURONTIN) 300 MG capsule Take 1 capsule (300 mg total) by mouth 3 (three) times daily. 90 capsule 6  . ondansetron (ZOFRAN) 8 MG tablet Take 1 tablet (8 mg total) by mouth every 8 (eight) hours as needed for nausea or vomiting. 20 tablet 3  . pantoprazole (PROTONIX) 40 MG tablet TAKE 1 TABLET(40 MG) BY MOUTH TWICE DAILY BEFORE A MEAL (Patient taking differently: Take 40 mg by mouth 2 (two) times daily.) 180 tablet 3  . potassium chloride SA (KLOR-CON) 20 MEQ tablet Take 1 tablet (20 mEq total) by mouth daily. 30 tablet 2  . spironolactone (ALDACTONE) 25 MG tablet Take 3 tablets (75 mg total) by mouth daily. 30 tablet 0  . loperamide (IMODIUM) 2 MG capsule Take 1-2 capsules (2-4 mg total) by mouth as needed for diarrhea or loose stools. (Patient not taking: Reported on 01/16/2021) 30 capsule 3   No current facility-administered medications for this encounter.    REVIEW OF SYSTEMS:  Notable for that above.   PHYSICAL EXAM:  vitals were not taken for this visit.      LABORATORY DATA:  Lab Results  Component Value Date   WBC 7.4 12/29/2020   HGB 13.0 12/29/2020   HCT 38.5 (L) 12/29/2020   MCV 99.5 12/29/2020   PLT 68 (L) 12/29/2020   CMP     Component Value Date/Time   NA 134 (L) 12/29/2020 0735   NA 140 12/17/2017 1130   NA 134 (L) 10/12/2012 1331   K 3.6 12/29/2020 0735   K 4.3 10/12/2012 1331   CL 102 12/29/2020 0735   CL 103 10/12/2012 1331   CO2 22 12/29/2020 0735   CO2 22 10/12/2012 1331   GLUCOSE 99 12/29/2020 0735   GLUCOSE 103 (H) 10/12/2012 1331   BUN 6 (L) 12/29/2020 0735   BUN 9 12/17/2017 1130   BUN 7.0 10/12/2012 1331   CREATININE 0.85 12/29/2020 0735   CREATININE 0.76 11/30/2020 1115   CREATININE 0.93 08/03/2020 1027   CREATININE 0.8 10/12/2012 1331   CALCIUM 8.0 (L)  12/29/2020 0735   CALCIUM 9.3 10/12/2012 1331   PROT 5.2 (L) 12/29/2020 0735   PROT 7.4 12/17/2017 1130   PROT 6.5 10/12/2012 1331   ALBUMIN 2.1 (L) 12/29/2020 0735   ALBUMIN 4.8 12/17/2017 1130   ALBUMIN 3.8 10/12/2012 1331   AST 40 12/29/2020 0735   AST 39 11/30/2020 1115   AST 111 (H) 10/12/2012 1331   ALT 26 12/29/2020 0735   ALT 43 11/30/2020 1115   ALT 151 (H)  10/12/2012 1331   ALKPHOS 126 12/29/2020 0735   ALKPHOS 88 10/12/2012 1331   BILITOT 2.9 (H) 12/29/2020 0735   BILITOT 2.8 (H) 11/30/2020 1115   BILITOT 1.08 10/12/2012 1331   GFRNONAA >60 12/29/2020 0735   GFRNONAA >60 11/30/2020 1115   GFRNONAA 86 08/03/2020 1027   GFRAA 99 08/03/2020 1027      Lab Results  Component Value Date   TSH 1.470 09/19/2020     RADIOGRAPHY: US Paracentesis  Result Date: 01/18/2021 INDICATION: Patient with history of HCC, alcoholic cirrhosis, hepatitis C, abdominal distension, and recurrent ascites. Request is made for therapeutic paracentesis up to 5 L. EXAM: ULTRASOUND GUIDED THERAPEUTIC PARACENTESIS MEDICATIONS: 10 mL 1% lidocaine COMPLICATIONS: None immediate. PROCEDURE: Informed written consent was obtained from the patient after a discussion of the risks, benefits and alternatives to treatment. A timeout was performed prior to the initiation of the procedure. Initial ultrasound scanning demonstrates a large amount of ascites within the left lower abdominal quadrant. The left lower abdomen was prepped and draped in the usual sterile fashion. 1% lidocaine was used for local anesthesia. Following this, a 19 gauge, 7-cm, Yueh catheter was introduced. An ultrasound image was saved for documentation purposes. The paracentesis was performed. The catheter was removed and a dressing was applied. The patient tolerated the procedure well without immediate post procedural complication. FINDINGS: A total of approximately 5 L of clear gold fluid was removed. IMPRESSION: Successful ultrasound-guided  paracentesis yielding 5 L of peritoneal fluid. Read by: Earley Abide, PA-C Electronically Signed   By: Sandi Mariscal M.D.   On: 01/18/2021 14:22   IR Radiologist Eval & Mgmt  Result Date: 12/27/2020 Please refer to notes tab for details about interventional procedure. (Op Note)     IMPRESSION/PLAN:  This is a delightful patient with head and neck cancer of the supraglottis.  He has a lot of comorbidities and a complex medical cohistory that affects his performance status profoundly.  He has met with otolaryngology and is not felt to be a good surgical candidate, nor does the patient want to sustain the morbidity of surgical resection.  Based on my assessment today, I would recommend a hypofractionated course of radiation therapy to the patient's tumor and bilateral high risk nodal echelons, anticipating a 4-week course of treatment.  He still does need a PET scan for staging and this will take place next week.  We will schedule him for a CT simulation to occur after his PET scan.   He will be meeting with medical oncology in the near future.  I am doubtful that he will be a candidate for concurrent chemotherapy.  We discussed the potential risks, benefits, and side effects of radiotherapy. We talked in detail about acute and late effects. We discussed that some of the most bothersome acute effects may be mucositis, dysgeusia, salivary changes, skin irritation, hair loss, dehydration, weight loss and fatigue. We talked about late effects which include but are not necessarily limited to dysphagia, hypothyroidism, nerve injury, vascular injury, spinal cord injury, xerostomia, trismus, neck edema, and potential injury to any of the tissues in the head and neck region. No guarantees of treatment were given. The patient is enthusiastic about proceeding with treatment.  He does state that his biggest priority at present is to undergo paracentesis because his ascites is so debilitating.  Anderson Malta, our patient  navigator, will call interventional radiology to see if she can expedite this.  I look forward to participating in the patient's care.  Simulation (treatment planning) will take place next week after his PET scan  We also discussed that the treatment of head and neck cancer is a multidisciplinary process to maximize treatment outcomes and quality of life. For this reason the following referrals have been or will be made:   Medical oncology to discuss chemotherapy    Dentistry for dental evaluation, possible extractions in the radiation fields, and /or advice on reducing risk of cavities, osteoradionecrosis, or other oral issues.   Nutritionist for nutrition support during and after treatment.   Speech language pathology for swallowing and/or speech therapy.   Social work for social support.    Physical therapy due to risk of lymphedema in neck and deconditioning.  Addendum: The patient later expressed hesitancy to attend a dentistry appointment.  He is rather overwhelmed by multiple appointments as well as his other medical issues.  Given the likelihood that I will be able to minimize radiation exposure to his tooth roots, patient will not see dentistry at this time; we will triage his referrals and appointments based on what is realistic for him.  This encounter was provided by telemedicine platform; patient desired telemedicine during pandemic precautions.  Telephone was used as Radiographer, therapeutic was not obtainable. The patient has given verbal consent for this type of encounter and has been advised to only accept a meeting of this type in a secure network environment. On date of service, in total, I spent 48 minutes on this encounter.   The attendants for this meeting include Eppie Gibson  and Truett Perna During the encounter, Eppie Gibson was located at [facility] Radiation Oncology Department.  Jeffrey Snow was located at home.    __________________________________________   Eppie Gibson, MD  This document serves as a record of services personally performed by Eppie Gibson, MD. It was created on his behalf by Clerance Lav, a trained medical scribe. The creation of this record is based on the scribe's personal observations and the provider's statements to them. This document has been checked and approved by the attending provider.

## 2021-01-18 ENCOUNTER — Other Ambulatory Visit: Payer: Self-pay

## 2021-01-18 ENCOUNTER — Ambulatory Visit (HOSPITAL_COMMUNITY)
Admission: RE | Admit: 2021-01-18 | Discharge: 2021-01-18 | Disposition: A | Discharge status: Home / Self Care | Payer: Medicare Other | Source: Ambulatory Visit | Attending: Hematology | Admitting: Hematology

## 2021-01-18 DIAGNOSIS — C220 Liver cell carcinoma: Secondary | ICD-10-CM

## 2021-01-18 DIAGNOSIS — A419 Sepsis, unspecified organism: Secondary | ICD-10-CM | POA: Diagnosis not present

## 2021-01-18 DIAGNOSIS — M79605 Pain in left leg: Secondary | ICD-10-CM | POA: Diagnosis not present

## 2021-01-18 MED ORDER — LIDOCAINE HCL 1 % IJ SOLN
INTRAMUSCULAR | Status: AC
  Filled 2021-01-18: qty 20

## 2021-01-18 NOTE — Procedures (Signed)
PROCEDURE SUMMARY:  Successful image-guided paracentesis from the left lateral abdomen.  Yielded 5.0 liters of clear gold fluid.  No immediate complications.  EBL <1 mL. Patient tolerated well.   Specimen was not sent for labs.  Please see imaging section of Epic for full dictation.   Claris Pong Lakaisha Danish PA-C 01/18/2021 2:20 PM

## 2021-01-19 ENCOUNTER — Encounter: Payer: Self-pay | Admitting: Radiation Oncology

## 2021-01-19 DIAGNOSIS — C321 Malignant neoplasm of supraglottis: Secondary | ICD-10-CM | POA: Insufficient documentation

## 2021-01-19 NOTE — Progress Notes (Signed)
Oncology Nurse Navigator Documentation  I spoke with Jeffrey Snow via telephone today about his upcoming appointments next week for his PET, CT simulation, and Medical Oncology so that he is aware. He requested that I arrange transportation for these appointments and I have sent an email to [facility]'s transportation services. Dr. Isidore Moos also requested that Jeffrey Snow see Dr. Benson Norway of dental medicine to discuss treatment side effects and strategies to decrease the side effects after completion of treatment. Jeffrey Snow declines the dental referral. He was made aware of the recommendation to see dental medicine and declines politely. He knows to call me if they have any further questions or concerns.   Harlow Asa RN, BSN, OCN Head & Neck Oncology Nurse [place] at [facility] Phone # [phone]  Fax # [phone]

## 2021-01-20 ENCOUNTER — Emergency Department (HOSPITAL_COMMUNITY): Payer: Medicare Other

## 2021-01-20 ENCOUNTER — Inpatient Hospital Stay (HOSPITAL_COMMUNITY)
Admission: EM | Admit: 2021-01-20 | Discharge: 2021-02-13 | DRG: 871 | Disposition: E | Payer: Medicare Other | Attending: Family Medicine | Admitting: Family Medicine

## 2021-01-20 ENCOUNTER — Other Ambulatory Visit: Payer: Self-pay

## 2021-01-20 ENCOUNTER — Encounter (HOSPITAL_COMMUNITY): Payer: Self-pay

## 2021-01-20 DIAGNOSIS — Z66 Do not resuscitate: Secondary | ICD-10-CM | POA: Diagnosis not present

## 2021-01-20 DIAGNOSIS — I70229 Atherosclerosis of native arteries of extremities with rest pain, unspecified extremity: Secondary | ICD-10-CM

## 2021-01-20 DIAGNOSIS — Z833 Family history of diabetes mellitus: Secondary | ICD-10-CM

## 2021-01-20 DIAGNOSIS — Z515 Encounter for palliative care: Secondary | ICD-10-CM

## 2021-01-20 DIAGNOSIS — L03116 Cellulitis of left lower limb: Secondary | ICD-10-CM | POA: Diagnosis present

## 2021-01-20 DIAGNOSIS — B1920 Unspecified viral hepatitis C without hepatic coma: Secondary | ICD-10-CM | POA: Diagnosis present

## 2021-01-20 DIAGNOSIS — R161 Splenomegaly, not elsewhere classified: Secondary | ICD-10-CM | POA: Diagnosis present

## 2021-01-20 DIAGNOSIS — Z8505 Personal history of malignant neoplasm of liver: Secondary | ICD-10-CM

## 2021-01-20 DIAGNOSIS — B182 Chronic viral hepatitis C: Secondary | ICD-10-CM | POA: Diagnosis not present

## 2021-01-20 DIAGNOSIS — K7030 Alcoholic cirrhosis of liver without ascites: Secondary | ICD-10-CM | POA: Diagnosis present

## 2021-01-20 DIAGNOSIS — M7989 Other specified soft tissue disorders: Secondary | ICD-10-CM | POA: Diagnosis not present

## 2021-01-20 DIAGNOSIS — Z79899 Other long term (current) drug therapy: Secondary | ICD-10-CM

## 2021-01-20 DIAGNOSIS — D539 Nutritional anemia, unspecified: Secondary | ICD-10-CM | POA: Diagnosis present

## 2021-01-20 DIAGNOSIS — C220 Liver cell carcinoma: Secondary | ICD-10-CM | POA: Diagnosis present

## 2021-01-20 DIAGNOSIS — R52 Pain, unspecified: Secondary | ICD-10-CM | POA: Diagnosis not present

## 2021-01-20 DIAGNOSIS — R6520 Severe sepsis without septic shock: Secondary | ICD-10-CM

## 2021-01-20 DIAGNOSIS — D696 Thrombocytopenia, unspecified: Secondary | ICD-10-CM | POA: Diagnosis present

## 2021-01-20 DIAGNOSIS — A419 Sepsis, unspecified organism: Secondary | ICD-10-CM | POA: Diagnosis present

## 2021-01-20 DIAGNOSIS — S92354A Nondisplaced fracture of fifth metatarsal bone, right foot, initial encounter for closed fracture: Secondary | ICD-10-CM | POA: Diagnosis present

## 2021-01-20 DIAGNOSIS — K7290 Hepatic failure, unspecified without coma: Secondary | ICD-10-CM | POA: Diagnosis present

## 2021-01-20 DIAGNOSIS — K7031 Alcoholic cirrhosis of liver with ascites: Secondary | ICD-10-CM | POA: Diagnosis present

## 2021-01-20 DIAGNOSIS — E875 Hyperkalemia: Secondary | ICD-10-CM

## 2021-01-20 DIAGNOSIS — F419 Anxiety disorder, unspecified: Secondary | ICD-10-CM

## 2021-01-20 DIAGNOSIS — R791 Abnormal coagulation profile: Secondary | ICD-10-CM

## 2021-01-20 DIAGNOSIS — R6521 Severe sepsis with septic shock: Secondary | ICD-10-CM | POA: Diagnosis present

## 2021-01-20 DIAGNOSIS — G629 Polyneuropathy, unspecified: Secondary | ICD-10-CM

## 2021-01-20 DIAGNOSIS — E872 Acidosis, unspecified: Secondary | ICD-10-CM | POA: Diagnosis present

## 2021-01-20 DIAGNOSIS — Z7189 Other specified counseling: Secondary | ICD-10-CM

## 2021-01-20 DIAGNOSIS — I10 Essential (primary) hypertension: Secondary | ICD-10-CM | POA: Diagnosis present

## 2021-01-20 DIAGNOSIS — K117 Disturbances of salivary secretion: Secondary | ICD-10-CM

## 2021-01-20 DIAGNOSIS — L0390 Cellulitis, unspecified: Secondary | ICD-10-CM | POA: Diagnosis not present

## 2021-01-20 DIAGNOSIS — Z885 Allergy status to narcotic agent status: Secondary | ICD-10-CM

## 2021-01-20 DIAGNOSIS — Z87891 Personal history of nicotine dependence: Secondary | ICD-10-CM

## 2021-01-20 DIAGNOSIS — H9190 Unspecified hearing loss, unspecified ear: Secondary | ICD-10-CM | POA: Diagnosis present

## 2021-01-20 DIAGNOSIS — R1313 Dysphagia, pharyngeal phase: Secondary | ICD-10-CM

## 2021-01-20 DIAGNOSIS — R7989 Other specified abnormal findings of blood chemistry: Secondary | ICD-10-CM | POA: Diagnosis not present

## 2021-01-20 DIAGNOSIS — D689 Coagulation defect, unspecified: Secondary | ICD-10-CM | POA: Diagnosis not present

## 2021-01-20 DIAGNOSIS — K219 Gastro-esophageal reflux disease without esophagitis: Secondary | ICD-10-CM | POA: Diagnosis present

## 2021-01-20 DIAGNOSIS — K7210 Chronic hepatic failure without coma: Secondary | ICD-10-CM | POA: Diagnosis not present

## 2021-01-20 DIAGNOSIS — L03119 Cellulitis of unspecified part of limb: Secondary | ICD-10-CM | POA: Diagnosis not present

## 2021-01-20 DIAGNOSIS — M79605 Pain in left leg: Secondary | ICD-10-CM | POA: Diagnosis present

## 2021-01-20 DIAGNOSIS — E871 Hypo-osmolality and hyponatremia: Secondary | ICD-10-CM | POA: Diagnosis not present

## 2021-01-20 DIAGNOSIS — G9341 Metabolic encephalopathy: Secondary | ICD-10-CM | POA: Diagnosis not present

## 2021-01-20 DIAGNOSIS — R109 Unspecified abdominal pain: Secondary | ICD-10-CM

## 2021-01-20 DIAGNOSIS — R0602 Shortness of breath: Secondary | ICD-10-CM

## 2021-01-20 DIAGNOSIS — K766 Portal hypertension: Secondary | ICD-10-CM | POA: Diagnosis present

## 2021-01-20 DIAGNOSIS — B189 Chronic viral hepatitis, unspecified: Secondary | ICD-10-CM

## 2021-01-20 DIAGNOSIS — I70299 Other atherosclerosis of native arteries of extremities, unspecified extremity: Secondary | ICD-10-CM | POA: Diagnosis not present

## 2021-01-20 DIAGNOSIS — Z20822 Contact with and (suspected) exposure to covid-19: Secondary | ICD-10-CM | POA: Diagnosis present

## 2021-01-20 DIAGNOSIS — I739 Peripheral vascular disease, unspecified: Secondary | ICD-10-CM | POA: Diagnosis not present

## 2021-01-20 DIAGNOSIS — Z888 Allergy status to other drugs, medicaments and biological substances status: Secondary | ICD-10-CM

## 2021-01-20 DIAGNOSIS — E538 Deficiency of other specified B group vitamins: Secondary | ICD-10-CM | POA: Diagnosis present

## 2021-01-20 DIAGNOSIS — C101 Malignant neoplasm of anterior surface of epiglottis: Secondary | ICD-10-CM

## 2021-01-20 DIAGNOSIS — C321 Malignant neoplasm of supraglottis: Secondary | ICD-10-CM | POA: Diagnosis present

## 2021-01-20 DIAGNOSIS — L02419 Cutaneous abscess of limb, unspecified: Secondary | ICD-10-CM | POA: Diagnosis not present

## 2021-01-20 DIAGNOSIS — I70262 Atherosclerosis of native arteries of extremities with gangrene, left leg: Secondary | ICD-10-CM | POA: Diagnosis present

## 2021-01-20 DIAGNOSIS — S92355A Nondisplaced fracture of fifth metatarsal bone, left foot, initial encounter for closed fracture: Secondary | ICD-10-CM

## 2021-01-20 DIAGNOSIS — N179 Acute kidney failure, unspecified: Secondary | ICD-10-CM | POA: Diagnosis present

## 2021-01-20 DIAGNOSIS — Z886 Allergy status to analgesic agent status: Secondary | ICD-10-CM

## 2021-01-20 DIAGNOSIS — I96 Gangrene, not elsewhere classified: Secondary | ICD-10-CM | POA: Diagnosis not present

## 2021-01-20 LAB — SARS CORONAVIRUS 2 BY RT PCR (HOSPITAL ORDER, PERFORMED IN ~~LOC~~ HOSPITAL LAB): SARS Coronavirus 2: NEGATIVE

## 2021-01-20 LAB — CBC WITH DIFFERENTIAL/PLATELET
Abs Immature Granulocytes: 0.19 10*3/uL — ABNORMAL HIGH (ref 0.00–0.07)
Basophils Absolute: 0 10*3/uL (ref 0.0–0.1)
Basophils Relative: 0 %
Eosinophils Absolute: 0.1 10*3/uL (ref 0.0–0.5)
Eosinophils Relative: 1 %
HCT: 38.4 % — ABNORMAL LOW (ref 39.0–52.0)
Hemoglobin: 12.2 g/dL — ABNORMAL LOW (ref 13.0–17.0)
Immature Granulocytes: 1 %
Lymphocytes Relative: 3 %
Lymphs Abs: 0.4 10*3/uL — ABNORMAL LOW (ref 0.7–4.0)
MCH: 33.7 pg (ref 26.0–34.0)
MCHC: 31.8 g/dL (ref 30.0–36.0)
MCV: 106.1 fL — ABNORMAL HIGH (ref 80.0–100.0)
Monocytes Absolute: 0.7 10*3/uL (ref 0.1–1.0)
Monocytes Relative: 5 %
Neutro Abs: 13 10*3/uL — ABNORMAL HIGH (ref 1.7–7.7)
Neutrophils Relative %: 90 %
Platelets: 48 10*3/uL — ABNORMAL LOW (ref 150–400)
RBC: 3.62 MIL/uL — ABNORMAL LOW (ref 4.22–5.81)
RDW: 17.9 % — ABNORMAL HIGH (ref 11.5–15.5)
WBC: 14.4 10*3/uL — ABNORMAL HIGH (ref 4.0–10.5)
nRBC: 0 % (ref 0.0–0.2)

## 2021-01-20 LAB — COMPREHENSIVE METABOLIC PANEL
ALT: 42 U/L (ref 0–44)
AST: 74 U/L — ABNORMAL HIGH (ref 15–41)
Albumin: 2 g/dL — ABNORMAL LOW (ref 3.5–5.0)
Alkaline Phosphatase: 151 U/L — ABNORMAL HIGH (ref 38–126)
Anion gap: 16 — ABNORMAL HIGH (ref 5–15)
BUN: 43 mg/dL — ABNORMAL HIGH (ref 8–23)
CO2: 15 mmol/L — ABNORMAL LOW (ref 22–32)
Calcium: 8.3 mg/dL — ABNORMAL LOW (ref 8.9–10.3)
Chloride: 103 mmol/L (ref 98–111)
Creatinine, Ser: 2.86 mg/dL — ABNORMAL HIGH (ref 0.61–1.24)
GFR, Estimated: 24 mL/min — ABNORMAL LOW (ref >60)
Glucose, Bld: 46 mg/dL — ABNORMAL LOW (ref 70–99)
Potassium: 5.3 mmol/L — ABNORMAL HIGH (ref 3.5–5.1)
Sodium: 134 mmol/L — ABNORMAL LOW (ref 135–145)
Total Bilirubin: 3.9 mg/dL — ABNORMAL HIGH (ref 0.3–1.2)
Total Protein: 5 g/dL — ABNORMAL LOW (ref 6.5–8.1)

## 2021-01-20 LAB — LACTIC ACID, PLASMA
Lactic Acid, Venous: 10.5 mmol/L (ref 0.5–1.9)
Lactic Acid, Venous: 9.6 mmol/L (ref 0.5–1.9)

## 2021-01-20 LAB — CBG MONITORING, ED
Glucose-Capillary: 22 mg/dL — CL (ref 70–99)
Glucose-Capillary: 89 mg/dL (ref 70–99)

## 2021-01-20 LAB — TROPONIN I (HIGH SENSITIVITY)
Troponin I (High Sensitivity): 9 ng/L (ref <18)
Troponin I (High Sensitivity): 12 ng/L (ref <18)

## 2021-01-20 LAB — BRAIN NATRIURETIC PEPTIDE: B Natriuretic Peptide: 785.5 pg/mL — ABNORMAL HIGH (ref 0.0–100.0)

## 2021-01-20 LAB — PROTIME-INR
INR: 2.2 — ABNORMAL HIGH (ref 0.8–1.2)
Prothrombin Time: 23.4 seconds — ABNORMAL HIGH (ref 11.4–15.2)

## 2021-01-20 LAB — AMMONIA: Ammonia: 37 umol/L — ABNORMAL HIGH (ref 9–35)

## 2021-01-20 LAB — GLUCOSE, CAPILLARY: Glucose-Capillary: 54 mg/dL — ABNORMAL LOW (ref 70–99)

## 2021-01-20 LAB — APTT: aPTT: 40 seconds — ABNORMAL HIGH (ref 24–36)

## 2021-01-20 MED ORDER — DEXTROSE 50 % IV SOLN
INTRAVENOUS | Status: AC
  Administered 2021-01-20: 50 mL
  Filled 2021-01-20: qty 50

## 2021-01-20 MED ORDER — LACTATED RINGERS IV BOLUS (SEPSIS)
1000.0000 mL | Freq: Once | INTRAVENOUS | Status: AC
  Administered 2021-01-20: 1000 mL via INTRAVENOUS

## 2021-01-20 MED ORDER — VANCOMYCIN HCL 2000 MG/400ML IV SOLN
2000.0000 mg | Freq: Once | INTRAVENOUS | Status: AC
  Administered 2021-01-20: 2000 mg via INTRAVENOUS
  Filled 2021-01-20: qty 400

## 2021-01-20 MED ORDER — FENTANYL CITRATE (PF) 100 MCG/2ML IJ SOLN
50.0000 ug | INTRAMUSCULAR | Status: DC | PRN
  Administered 2021-01-20 – 2021-01-23 (×11): 50 ug via INTRAVENOUS
  Filled 2021-01-20 (×12): qty 2

## 2021-01-20 MED ORDER — SODIUM CHLORIDE 0.9 % IV SOLN
2.0000 g | INTRAVENOUS | Status: DC
  Administered 2021-01-21: 2 g via INTRAVENOUS
  Filled 2021-01-20: qty 2

## 2021-01-20 MED ORDER — VANCOMYCIN HCL IN DEXTROSE 1-5 GM/200ML-% IV SOLN
1000.0000 mg | INTRAVENOUS | Status: DC
  Administered 2021-01-21 – 2021-01-23 (×3): 1000 mg via INTRAVENOUS
  Filled 2021-01-20 (×3): qty 200

## 2021-01-20 MED ORDER — CHLORHEXIDINE GLUCONATE CLOTH 2 % EX PADS: 6.0000 | MEDICATED_PAD | Freq: Every day | CUTANEOUS | Status: DC

## 2021-01-20 MED ORDER — SODIUM CHLORIDE 0.9 % IV SOLN
2.0000 g | Freq: Once | INTRAVENOUS | Status: AC
  Administered 2021-01-20: 2 g via INTRAVENOUS
  Filled 2021-01-20: qty 2

## 2021-01-20 MED ORDER — DEXTROSE IN LACTATED RINGERS 5 % IV SOLN: INTRAVENOUS | Status: DC

## 2021-01-20 MED ORDER — LACTATED RINGERS IV SOLN: INTRAVENOUS | Status: DC

## 2021-01-20 MED ORDER — ALBUMIN HUMAN 25 % IV SOLN
100.0000 g | Freq: Once | INTRAVENOUS | Status: AC
  Administered 2021-01-20: 100 g via INTRAVENOUS
  Filled 2021-01-20: qty 400

## 2021-01-20 MED ORDER — LACTATED RINGERS IV BOLUS (SEPSIS)
250.0000 mL | Freq: Once | INTRAVENOUS | Status: AC
  Administered 2021-01-20: 250 mL via INTRAVENOUS

## 2021-01-20 MED ORDER — LACTATED RINGERS IV BOLUS
1000.0000 mL | Freq: Once | INTRAVENOUS | Status: AC
  Administered 2021-01-20: 1000 mL via INTRAVENOUS

## 2021-01-20 MED ORDER — VANCOMYCIN HCL IN DEXTROSE 1-5 GM/200ML-% IV SOLN
1000.0000 mg | Freq: Once | INTRAVENOUS | Status: DC
  Filled 2021-01-20: qty 200

## 2021-01-20 MED ORDER — FENTANYL CITRATE (PF) 100 MCG/2ML IJ SOLN
50.0000 ug | Freq: Once | INTRAMUSCULAR | Status: AC
  Administered 2021-01-20: 50 ug via INTRAVENOUS
  Filled 2021-01-20: qty 2

## 2021-01-20 NOTE — ED Notes (Signed)
Pt aware urine sample is needed 

## 2021-01-20 NOTE — ED Notes (Signed)
Date and time results received: 2021/02/10 4:13 PM    Test: Lactic Critical Value: 10.5  Name of Provider Notified: Serita Grit  Orders Received? Or Actions Taken?: To be entered

## 2021-01-20 NOTE — ED Triage Notes (Signed)
Patient presented to the ED with c/o  Left leg pain started 3 weeks ago. Patient had a mechanical fall today today. Patient denies hitting his head, not on blood thinners, no LOC. Patient is a cancer patient.

## 2021-01-20 NOTE — ED Notes (Signed)
Patient provided with 12 oz of juice

## 2021-01-20 NOTE — Progress Notes (Signed)
A consult was received from an ED provider for vancomycin per pharmacy dosing.  The patient's profile has been reviewed for ht/wt/allergies/indication/available labs.   A one time order has been placed for vancomycin 2 g IV.  Further antibiotics/pharmacy consults should be ordered by admitting physician if indicated.                       Thank you, Efraim Kaufmann, PharmD, BCPS 02/08/2021  4:40 PM

## 2021-01-20 NOTE — ED Notes (Signed)
Alfredia Client, PA aware patients lactic 10.5

## 2021-01-20 NOTE — H&P (Signed)
[facility] History and Physical  Jeffrey Snow [id number] DOB: 10/05/1956 DOA: 02/03/2021  Referring physician: Alfredia Client, PA-C PCP: Azzie Glatter, FNP   Chief Complaint: leg pain  HPI: Jeffrey Snow is a 65 y.o. male with history of alcoholic cirrhosis with HCC, hepatitis C, hypertension, PAD, recently diagnosed epiglottic cancer, who presents with left leg pain after a fall.  Review of chart notable for recent diagnosis of epiglottic cancer after a biopsy late last month.  So radiation oncology earlier this week with plan for radiation treatment.  Last saw oncology on January 7, at that time reporting lower extremity swelling and erythema, picture in the chart at that time provides good comparison to photo taken today. Also had therapeutic paracentesis performed on [id number] without complication.   Patient challenging historian and also appears to be quite hard of hearing.  Frequently circular conversations had with patient regarding concerns about being moved to an assisted living facility and losing his apartment and possessions.  Through repeated questioning on my history patient reports that earlier today he was getting up to go to answer the door to let his brother and using his walker when he fell and hit his left leg.  He denies to me and also the ED staff that he had any head strike.  Endorses having had some leg swelling but unable to say for how long.  In the ED initial vital signs notable for soft pressures running 80s to 90s over 50s, remainder of vital signs within normal limits.  Lab work-up notable for CMP showing AKI with creatinine at 2.8 above baseline of 0.8, anion gap of 16, albumin 2, AST 74, ALT 42, T bili 3.9 up from 2.9 on last check.  Troponin was normal, BNP elevated at 785 up from 51 3 months ago, INR at 2.2 elevated from 1.63 months ago, lactic acid markedly elevated at 10.  Covid test was negative.  Patient was started on sepsis protocol, given  2.5 L of lactated Ringer's, and also started on cefepime and vancomycin.  Repeat lactic acid was mildly downtrending to 9.6 on recheck.  Review of Systems:  Pertinent positives and negative per HPI, all others reviewed and negative  Past Medical History:  Diagnosis Date  . Abnormal MRI of abdomen, liver  04/21/2014  . Anxiety   . Cirrhosis, alcoholic ([place]) [id number]  . Claudication of left lower extremity ([place]) 10/2020  . Depression   . [place] (hepatocellular carcinoma) ([place])   . Hearing deficit   . Hepatitis C   . Hypertension   . Liver mass   . Panic attack    Past Surgical History:  Procedure Laterality Date  . ABDOMINAL AORTAGRAM N/A 12/27/2013   Procedure: ABDOMINAL Maxcine Ham;  Surgeon: Conrad Monterey, MD;  Location: [facility] CATH LAB;  Service: Cardiovascular;  Laterality: N/A;  . AORTOGRAM    . COSMETIC SURGERY     facial reconstructive surgery  . DIRECT LARYNGOSCOPY N/A 12/29/2020   Procedure: DIRECT LARYNGOSCOPY WITH BIOPSY;  Surgeon: Rozetta Nunnery, MD;  Location: [facility] OR;  Service: ENT;  Laterality: N/A;  . ESOPHAGOGASTRODUODENOSCOPY N/A 12/17/2014   Procedure: ESOPHAGOGASTRODUODENOSCOPY (EGD);  Surgeon: Missy Sabins, MD;  Location: [facility] ENDOSCOPY;  Service: Endoscopy;  Laterality: N/A;  . ESOPHAGOGASTRODUODENOSCOPY N/A 09/05/2015   Procedure: ESOPHAGOGASTRODUODENOSCOPY (EGD);  Surgeon: Wonda Horner, MD;  Location: [facility] ENDOSCOPY;  Service: Endoscopy;  Laterality: N/A;  . IR ANGIOGRAM EXTREMITY RIGHT  03/21/2020  . IR ANGIOGRAM SELECTIVE EACH ADDITIONAL VESSEL  03/10/2020  .  IR ANGIOGRAM SELECTIVE EACH ADDITIONAL VESSEL  03/10/2020  . IR ANGIOGRAM SELECTIVE EACH ADDITIONAL VESSEL  03/10/2020  . IR ANGIOGRAM SELECTIVE EACH ADDITIONAL VESSEL  03/10/2020  . IR ANGIOGRAM SELECTIVE EACH ADDITIONAL VESSEL  03/10/2020  . IR ANGIOGRAM SELECTIVE EACH ADDITIONAL VESSEL  03/21/2020  . IR ANGIOGRAM SELECTIVE EACH ADDITIONAL VESSEL  03/21/2020  . IR ANGIOGRAM SELECTIVE EACH ADDITIONAL VESSEL  03/21/2020  .  IR ANGIOGRAM SELECTIVE EACH ADDITIONAL VESSEL  03/21/2020  . IR ANGIOGRAM SELECTIVE EACH ADDITIONAL VESSEL  03/21/2020  . IR ANGIOGRAM SELECTIVE EACH ADDITIONAL VESSEL  04/18/2020  . IR ANGIOGRAM SELECTIVE EACH ADDITIONAL VESSEL  04/18/2020  . IR ANGIOGRAM VISCERAL SELECTIVE  03/10/2020  . IR ANGIOGRAM VISCERAL SELECTIVE  03/21/2020  . IR ANGIOGRAM VISCERAL SELECTIVE  04/18/2020  . IR EMBO ARTERIAL NOT HEMORR HEMANG INC GUIDE ROADMAPPING  03/10/2020  . IR EMBO TUMOR ORGAN ISCHEMIA INFARCT INC GUIDE ROADMAPPING  03/21/2020  . IR EMBO TUMOR ORGAN ISCHEMIA INFARCT INC GUIDE ROADMAPPING  04/18/2020  . IR GENERIC HISTORICAL  01/19/2015   IR RADIOLOGIST EVAL & MGMT 01/19/2015 Jacqulynn Cadet, MD [facility] INTERV RAD  . IR GENERIC HISTORICAL  07/23/2016   IR RADIOLOGIST EVAL & MGMT 07/23/2016 Jacqulynn Cadet, MD [facility] INTERV RAD  . IR GENERIC HISTORICAL  05/30/2015   IR RADIOLOGIST EVAL & MGMT 05/30/2015 [facility] INTERV RAD  . IR GENERIC HISTORICAL  10/04/2014   IR RADIOLOGIST EVAL & MGMT 10/04/2014 Jacqulynn Cadet, MD [facility] INTERV RAD  . IR GENERIC HISTORICAL  02/11/2017   IR RADIOLOGIST EVAL & MGMT 02/11/2017 Jacqulynn Cadet, MD [facility] INTERV RAD  . IR PARACENTESIS  10/23/2020  . IR RADIOLOGIST EVAL & MGMT  08/13/2017  . IR RADIOLOGIST EVAL & MGMT  08/11/2018  . IR RADIOLOGIST EVAL & MGMT  02/09/2019  . IR RADIOLOGIST EVAL & MGMT  02/10/2020  . IR RADIOLOGIST EVAL & MGMT  05/11/2020  . IR RADIOLOGIST EVAL & MGMT  08/09/2020  . IR RADIOLOGIST EVAL & MGMT  12/27/2020  . IR US GUIDE VASC ACCESS LEFT  03/10/2020  . IR US GUIDE VASC ACCESS LEFT  03/21/2020  . IR US GUIDE VASC ACCESS LEFT  04/18/2020  . IR US GUIDE VASC ACCESS RIGHT  03/21/2020  . LOWER EXTREMITY ANGIOGRAM Bilateral 12/27/2013   Procedure: LOWER EXTREMITY ANGIOGRAM;  Surgeon: Conrad Darmstadt, MD;  Location: [facility] CATH LAB;  Service: Cardiovascular;  Laterality: Bilateral;   Social History:  reports that he quit smoking about 9 years ago. He has a 17.50 pack-year smoking  history. He has never used smokeless tobacco. He reports previous alcohol use. He reports that he does not use drugs.  Allergies  Allergen Reactions  . Aspirin Other (See Comments)    GI bleeding  . Nsaids Other (See Comments)    Gi bleeding  . Folic Acid Other (See Comments)    Makes stomach feel funny  . Oxycodone Itching and Other (See Comments)    Cause insomnia    Family History  Problem Relation Age of Onset  . Diabetes Father   . Diabetes Brother   . Diabetes Brother      Prior to Admission medications   Medication Sig Start Date End Date Taking? Authorizing Provider  albuterol (VENTOLIN HFA) 108 (90 Base) MCG/ACT inhaler Inhale 2 puffs into the lungs every 6 (six) hours as needed for wheezing or shortness of breath. 07/04/20  Yes Azzie Glatter, FNP  ALPRAZolam Duanne Moron) 1 MG tablet Take 1 mg by mouth 2 (two)  times daily.   Yes [provider]  bismuth subsalicylate (PEPTO BISMOL) 262 MG/15ML suspension Take 30 mLs by mouth every 6 (six) hours as needed for diarrhea or loose stools.   Yes [provider]  Dextromethorphan-guaiFENesin (ROBITUSSIN COUGH+CHEST CONG DM) 20-200 MG/20ML LIQD Take 10 mLs by mouth every 4 (four) hours as needed (cough).   Yes [provider]  furosemide (LASIX) 20 MG tablet Take 1 tablet (20 mg total) by mouth daily. 11/22/20  Yes Azzie Glatter, FNP  gabapentin (NEURONTIN) 300 MG capsule Take 1 capsule (300 mg total) by mouth 3 (three) times daily. 06/06/20  Yes Azzie Glatter, FNP  pantoprazole (PROTONIX) 40 MG tablet TAKE 1 TABLET(40 MG) BY MOUTH TWICE DAILY BEFORE A MEAL Patient taking differently: Take 40 mg by mouth 2 (two) times daily. 11/22/20  Yes Azzie Glatter, FNP  potassium chloride SA (KLOR-CON) 20 MEQ tablet Take 1 tablet (20 mEq total) by mouth daily. 12/22/20  Yes Truitt Merle, MD  spironolactone (ALDACTONE) 25 MG tablet Take 3 tablets (75 mg total) by mouth daily. 10/07/20  Yes Nuala Alpha A, PA-C   loperamide (IMODIUM) 2 MG capsule Take 1-2 capsules (2-4 mg total) by mouth as needed for diarrhea or loose stools. Patient not taking: No sig reported 11/30/20   Alla Feeling, NP  ondansetron (ZOFRAN) 8 MG tablet Take 1 tablet (8 mg total) by mouth every 8 (eight) hours as needed for nausea or vomiting. Patient not taking: Reported on 02/04/2021 11/30/20   Alla Feeling, NP   Physical Exam: Vitals:   01/24/2021 1630 02/11/2021 1730 02/02/2021 1800 01/24/2021 1815  BP: (!) 85/52 (!) 89/50 (!) 98/59 (!) 90/52  Pulse: 90 86 86 88  Resp: 15 15 (!) 22 20  Temp:      TempSrc:      SpO2: 100% 99% 100% 100%  Height:        Wt Readings from Last 3 Encounters:  12/22/20 80.6 kg  11/30/20 83.2 kg  10/07/20 81.2 kg    . General:  Appears calm, chronically ill. Voice raspy and difficult to understand . Eyes: PERRL, normal lids, irises & conjunctiva . ENT: hard of hearing . Neck: no masses or thyromegaly . Cardiovascular: RRR, no m/r/g. No edema of RLE, marked edema of LLE. JVD does not appear elevated . Telemetry: SR, no arrhythmias  . Respiratory: CTA bilaterally, no w/r/r. Normal respiratory effort. . Abdomen: soft, non-tender, markedly distended with +fluid wave . Skin: no rash or induration seen on limited exam . Musculoskeletal: grossly normal tone BUE/BLE . Psychiatric: grossly normal mood and affect, speech fluent and appropriate . Neurologic: grossly non-focal. No asterixis, negative milk maids sign. AOx3          Labs on Admission:  Basic Metabolic Panel: Recent Labs  Lab 01/18/2021 1509  NA 134*  K 5.3*  CL 103  CO2 15*  GLUCOSE 46*  BUN 43*  CREATININE 2.86*  CALCIUM 8.3*   Liver Function Tests: Recent Labs  Lab 02/12/2021 1509  AST 74*  ALT 42  ALKPHOS 151*  BILITOT 3.9*  PROT 5.0*  ALBUMIN 2.0*   No results for input(s): LIPASE, AMYLASE in the last 168 hours. No results for input(s): AMMONIA in the last 168 hours. CBC: Recent Labs  Lab 01/19/2021 1509  WBC  14.4*  NEUTROABS 13.0*  HGB 12.2*  HCT 38.4*  MCV 106.1*  PLT 48*   Cardiac Enzymes: No results for input(s): CKTOTAL, CKMB, CKMBINDEX, TROPONINI in  the last 168 hours.  BNP (last 3 results) Recent Labs    10/07/20 1135 02/03/2021 1509  BNP 51.0 785.5*    ProBNP (last 3 results) No results for input(s): PROBNP in the last 8760 hours.  CBG: No results for input(s): GLUCAP in the last 168 hours.  Radiological Exams on Admission: DG Tibia/Fibula Left  Result Date: 02/11/2021 CLINICAL DATA:  Fall EXAM: LEFT TIBIA AND FIBULA - 2 VIEW COMPARISON:  None. FINDINGS: No acute fracture or dislocation. Joint spaces and alignment are maintained. No area of erosion or osseous destruction. No unexpected radiopaque foreign body. Vascular calcifications. IMPRESSION: No acute fracture or dislocation. Electronically Signed   By: Valentino Saxon MD   On: 02/11/2021 17:41   DG Tibia/Fibula Right  Result Date: 02/03/2021 CLINICAL DATA:  Pain EXAM: RIGHT TIBIA AND FIBULA - 2 VIEW COMPARISON:  None. FINDINGS: Osteopenia. No acute fracture or dislocation. Joint spaces and alignment are maintained. No area of erosion or osseous destruction. No unexpected radiopaque foreign body. Vascular calcifications. IMPRESSION: No acute fracture or dislocation. Electronically Signed   By: Valentino Saxon MD   On: 01/28/2021 17:44   DG Chest Port 1 View  Result Date: 01/18/2021 CLINICAL DATA:  Fall EXAM: PORTABLE CHEST 1 VIEW COMPARISON:  November 02, 2009 FINDINGS: The cardiomediastinal silhouette is unchanged in contour. Small LEFT pleural effusion versus pleural blunting. No pneumothorax. No acute pleuroparenchymal abnormality. Visualized abdomen is unremarkable. No acute osseous abnormality. Vascular calcifications. IMPRESSION: No acute cardiopulmonary abnormality. Electronically Signed   By: Valentino Saxon MD   On: 02/02/2021 17:43   DG Foot Complete Left  Result Date: 01/28/2021 CLINICAL DATA:  Cough EXAM:  LEFT FOOT - COMPLETE 3+ VIEW COMPARISON:  None. FINDINGS: Osteopenia No acute fracture or dislocation. Joint spaces and alignment are maintained. No area of erosion or osseous destruction. No unexpected radiopaque foreign body. Vascular calcifications. IMPRESSION: No acute fracture or dislocation. Electronically Signed   By: Valentino Saxon MD   On: 01/18/2021 17:42   DG Foot Complete Right  Result Date: 02/06/2021 CLINICAL DATA:  Pain EXAM: RIGHT FOOT COMPLETE - 3+ VIEW COMPARISON:  March 15, 2015 FINDINGS: Osteopenia. There is a nondisplaced fracture of the fifth metatarsal shaft. Is mildly comminuted appearance. No intra-articular extension. Minimal degenerative changes of the first MTP. No additional fracture noted. Vascular calcifications. IMPRESSION: Nondisplaced fracture of the fifth metatarsal shaft. Electronically Signed   By: Valentino Saxon MD   On: 01/19/2021 17:46   DG Hip Unilat With Pelvis 2-3 Views Left  Result Date: 02/12/2021 CLINICAL DATA:  Fall EXAM: DG HIP (WITH OR WITHOUT PELVIS) 2-3V LEFT COMPARISON:  October 07, 2020 FINDINGS: No acute fracture or dislocation. Chronic deformity of the RIGHT pubic ramus consistent with sequela of remote prior trauma. Degenerative changes of the lower lumbar spine. No area of erosion or osseous destruction. No unexpected radiopaque foreign body. Vascular calcifications. IMPRESSION: No acute fracture or dislocation. If persistent concern for nondisplaced hip or pelvic fracture, recommend dedicated pelvic MRI. Electronically Signed   By: Valentino Saxon MD   On: 01/16/2021 17:39   VAS [place] LOWER EXTREMITY VENOUS (DVT) ([facility] and [facility] 7a-7p)  Result Date: 01/19/2021  Lower Venous DVT Study Indications: Pain, and Swelling.  Risk Factors: Cancer newly diagnosed preepiglottic mass, recurrent multifocal hepatocellular carcinoma, and invasiveesquamous cell carcinoma. Rrecent fall. Left leg pain X 3 weeks. Ascites. Limitations: Edema, patient's pain.  Comparison Study: Prior negative bilateral LEV duplex from 04/22/14, is  available Performing Technologist: Sharion Dove RVS  Examination Guidelines: A complete evaluation includes B-mode imaging, spectral Doppler, color Doppler, and power Doppler as needed of all accessible portions of each vessel. Bilateral testing is considered an integral part of a complete examination. Limited examinations for reoccurring indications may be performed as noted. The reflux portion of the exam is performed with the patient in reverse Trendelenburg.  +---------+---------------+---------+-----------+----------+---------------+ RIGHT    CompressibilityPhasicitySpontaneityPropertiesThrombus Aging  +---------+---------------+---------+-----------+----------+---------------+ CFV      Full           Yes      Yes                                  +---------+---------------+---------+-----------+----------+---------------+ SFJ      Full                                                         +---------+---------------+---------+-----------+----------+---------------+ FV Prox  Full                                                         +---------+---------------+---------+-----------+----------+---------------+ FV Mid   Full                                                         +---------+---------------+---------+-----------+----------+---------------+ FV DistalFull                                                         +---------+---------------+---------+-----------+----------+---------------+ PFV      Full                                                         +---------+---------------+---------+-----------+----------+---------------+ POP      Full           Yes      Yes                                  +---------+---------------+---------+-----------+----------+---------------+ PTV                                                   patent by color  +---------+---------------+---------+-----------+----------+---------------+ PERO  patent by color +---------+---------------+---------+-----------+----------+---------------+   +---------+---------------+---------+-----------+----------+-------------------+ LEFT     CompressibilityPhasicitySpontaneityPropertiesThrombus Aging      +---------+---------------+---------+-----------+----------+-------------------+ CFV      Full           Yes      Yes                                      +---------+---------------+---------+-----------+----------+-------------------+ SFJ      Full                                                             +---------+---------------+---------+-----------+----------+-------------------+ FV Prox                 Yes      Yes                  patent by color and                                                       Doppler             +---------+---------------+---------+-----------+----------+-------------------+ FV Mid   Full                                                             +---------+---------------+---------+-----------+----------+-------------------+ FV Distal               Yes      Yes                  patent by color and                                                       Doppler             +---------+---------------+---------+-----------+----------+-------------------+ POP                     Yes      Yes                  patent by color and                                                       Doppler             +---------+---------------+---------+-----------+----------+-------------------+ PTV  patent by color and                                                       Doppler             +---------+---------------+---------+-----------+----------+-------------------+ PERO                                                   Not well visualized +---------+---------------+---------+-----------+----------+-------------------+    Summary: RIGHT: - There is no evidence of deep vein thrombosis in the lower extremity. However, portions of this examination were limited- see technologist comments above.  LEFT: - There is no evidence of deep vein thrombosis in the lower extremity. However, portions of this examination were limited- see technologist comments above.  *See table(s) above for measurements and observations.    Preliminary     EKG: Independently reviewed. My read: Sinus rhythm, low voltage throughout, no ischemic changes.  Compared to last EKG on October 23 T wave changes in lateral leads are no longer present.  Assessment/Plan Active Problems:   Severe sepsis (HCC)  #Severe Sepsis #Cellulitis of left lower extremity #Lactic Acidosis #Anion gap metabolic acidosis #Leukocytosis Patient presenting with severe sepsis likely secondary to his left lower extremity cellulitis seen on exam.  No other likely source is found during initial work-up.  Some consideration should be given to SBP, consider diagnostic paracentesis.  Already started on broad-spectrum antibiotics, will continue for the time being.  Has received 2.5 L of fluid with improvement in his blood pressure, however his lactic acid has only responded minimally.  Given his comorbidities I am concerned about third spacing, will continue to fluid resuscitate primarily with albumin for the time being and hold on further LR boluses. -Continue cefepime per pharmacy protocol -Continue vancomycin per pharmacy protocol -Consider diagnostic paracentesis in the a.m. to evaluate for SBP, may be of limited value at this time -Follow-up blood and urine cultures -Trend lactate -Give 100 g albumin -LR at 150 cc/h  #AKI #Hyperkalemia Likely prerenal in the setting of sepsis, less likely hepatorenal but will have a clear picture  on repeat CMP status post fluid resuscitation.  Very mild hyperkalemia secondary to AKI, will watch closely. -Trend CMP, consult Nephrology if kidney function does not improve -Hold Lasix and spironolactone  #Elevated BNP Patient was significantly elevated BNP but does not appear fluid overloaded on exam with no edema in right lower extremity and JVD not elevated, though does appear to be third spacing.  BNP known to be significantly elevated in cirrhosis, likely reflects advancement of his cirrhotic disease. -Consider echo pending clinical course  #L foot fracture X-ray of the left lower extremity shows nondisplaced fracture of the fifth metatarsal shaft. -Consult podiatry versus Ortho in a.m.  #HCC #Abnormal coagulation studies #HCV Significantly elevated INR on admission, again likely representing advancement of his cirrhotic disease. -Holding spironolactone and Lasix in setting of AKI  #Known Medical Problems Thromboctyopenia -at baseline Anxiety - cont xanax Neuropathy - reduce gabapentin to 300mg  daily due to AKI GERD - cont PPI   Code Status: Full Code, confirmed DVT Prophylaxis: SCDs Family Communication: attempted to call daughter with [name] with no answer Disposition Plan: Inpatient  Step Down Unit   Time spent: 6 min  Clarnce Flock MD/[facility]

## 2021-01-20 NOTE — Progress Notes (Signed)
Notified by RN that patient's glucose 22, likely secondary to sepsis. Will change mIVF to D5-LR.

## 2021-01-20 NOTE — ED Notes (Signed)
MD Dione Plover made aware of patient's sugar. Verbal order for an amp of D50 given per hypoglycemia protocol. Patient is currently alert and oriented, able to converse, so will also provide juice.

## 2021-01-20 NOTE — Sepsis Progress Note (Signed)
Sepsis protocol is being monitored by [facility]. 

## 2021-01-20 NOTE — ED Provider Notes (Signed)
[facility] DEPT Provider Note   CSN: [id number] Arrival date & time: 01/16/2021  1411     History Chief Complaint  Patient presents with  . Leg Pain    Jeffrey Snow is a 65 y.o. male with past medical history of alcoholic cirrhosis with HCC, hepatitis C, hypertension, is currently being worked up for preepiglottic mass with Dr. Lucia Gaskins that presents to the emergency department today for multiple complaints.  Patient states that he has been having lower extremity leg pain bilaterally, worse on the left for the past 3 weeks, has not seen anyone for this.  Denies any fevers, chills, nausea or vomiting.  Patient states that he has been able to walk or sleep due to the pain.  Patient lives alone at home.  Patient states that he primarily came in today because he fell.  Patient states that he was trying to get the door his brother was visiting, fell on his walker on the left side.  Did not hit his head.  No LOC.  Patient states that he is mainly complaining of left foot pain, which was already hurting him prior to this injury.  States that he primarily fell because he tripped on his walker.  Prior to this he has no falls.  Patient states that he also started having shortness of breath today.  States that this is new for him.  No cough or URI symptoms.  Does not wear any oxygen.  Is not any blood thinners.  Patient is a poor historian, very hard to understand.  Does not have any chest pain.  Per chart review patient had 5 L of fluid drained from him, paracentesis 2 days ago.   HPI     Past Medical History:  Diagnosis Date  . Abnormal MRI of abdomen, liver  04/21/2014  . Anxiety   . Cirrhosis, alcoholic ([place]) 08/20/2840  . Claudication of left lower extremity ([place]) 10/2020  . Depression   . [place] (hepatocellular carcinoma) ([place])   . Hearing deficit   . Hepatitis C   . Hypertension   . Liver mass   . Panic attack     Patient Active Problem List   Diagnosis Date  Noted  . Severe sepsis ([place]) 01/22/2021  . 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 Traumatic rhabdomyolysis ([place]) 09/19/2020  . History of GI bleed 09/19/2020  . Rhabdomyolysis 09/19/2020  . Wheezing 11/28/2019  . Neuropathy 11/28/2019  . Anxiety 11/28/2019  . Acute upper GI bleed 09/05/2015  . Lactic acidosis 09/05/2015  . Hyperkalemia 09/05/2015  . Hemorrhagic shock ([place]) 09/05/2015  . Urinary tract infection, acute 08/31/2015  . Anemia, blood loss 12/16/2014  . Melena 12/16/2014  . Anemia 12/16/2014  . Abdominal pain, chronic, right upper quadrant 07/06/2014  . [place] (hepatocellular carcinoma) ([place]) 06/09/2014  . Other pancytopenia ([place]) 04/27/2014  . Cellulitis of leg 04/24/2014  . Elevated troponin 04/23/2014  . Normocytic anemia 04/21/2014  . Cirrhosis, alcoholic ([place]) 32/44/0102  . Hyponatremia 04/21/2014  . Hypokalemia 04/21/2014  . Metabolic acidosis 72/53/6644  . Hepatitis C 04/21/2014  . Abnormal MRI of abdomen, liver  04/21/2014  . Leg pain 04/21/2014  . Rash and nonspecific skin eruption 04/21/2014  . T wave inversion in EKG 04/21/2014  . Right foot pain 03/31/2014  . Hyperglycemia 03/03/2014  . Vitamin D deficiency disease 03/03/2014  . HTN (hypertension) 03/03/2014  . Elevated LFTs 03/03/2014  . Screening for colon cancer 03/03/2014  . Annual physical exam 03/03/2014  .  PVD (peripheral vascular disease) ([place]) 12/03/2013  . PAD (peripheral artery disease) ([place]) 10/26/2013  . Arterial insufficiency ([place]) 10/26/2013  . Fall 10/26/2013  . Depression 09/22/2013  . Insomnia 09/22/2013  . Claudication of left lower extremity ([place])   . Edema 08/20/2013  . Pain in limb 08/20/2013  . Atherosclerosis of native arteries of the extremities with intermittent claudication 08/20/2013  . Thrombocytopenia ([place]) 10/12/2012    Past Surgical History:  Procedure Laterality Date  . ABDOMINAL AORTAGRAM N/A 12/27/2013   Procedure: ABDOMINAL Maxcine Ham;   Surgeon: Conrad Del Mar, MD;  Location: [facility] CATH LAB;  Service: Cardiovascular;  Laterality: N/A;  . AORTOGRAM    . COSMETIC SURGERY     facial reconstructive surgery  . DIRECT LARYNGOSCOPY N/A 12/29/2020   Procedure: DIRECT LARYNGOSCOPY WITH BIOPSY;  Surgeon: Rozetta Nunnery, MD;  Location: [facility] OR;  Service: ENT;  Laterality: N/A;  . ESOPHAGOGASTRODUODENOSCOPY N/A 12/17/2014   Procedure: ESOPHAGOGASTRODUODENOSCOPY (EGD);  Surgeon: Missy Sabins, MD;  Location: [facility];  Service: Endoscopy;  Laterality: N/A;  . ESOPHAGOGASTRODUODENOSCOPY N/A 09/05/2015   Procedure: ESOPHAGOGASTRODUODENOSCOPY (EGD);  Surgeon: Wonda Horner, MD;  Location: [facility]OS[facility];  Service: Endoscopy;  Laterality: N/A;  . IR ANGIOGRAM EXTREMITY RIGHT  03/21/2020  . IR ANGIOGRAM SELECTIVE EACH ADDITIONAL VESSEL  03/10/2020  . IR ANGIOGRAM SELECTIVE EACH ADDITIONAL VESSEL  03/10/2020  . IR ANGIOGRAM SELECTIVE EACH ADDITIONAL VESSEL  03/10/2020  . IR ANGIOGRAM SELECTIVE EACH ADDITIONAL VESSEL  03/10/2020  . IR ANGIOGRAM SELECTIVE EACH ADDITIONAL VESSEL  03/10/2020  . IR ANGIOGRAM SELECTIVE EACH ADDITIONAL VESSEL  03/21/2020  . IR ANGIOGRAM SELECTIVE EACH ADDITIONAL VESSEL  03/21/2020  . IR ANGIOGRAM SELECTIVE EACH ADDITIONAL VESSEL  03/21/2020  . IR ANGIOGRAM SELECTIVE EACH ADDITIONAL VESSEL  03/21/2020  . IR ANGIOGRAM SELECTIVE EACH ADDITIONAL VESSEL  03/21/2020  . IR ANGIOGRAM SELECTIVE EACH ADDITIONAL VESSEL  04/18/2020  . IR ANGIOGRAM SELECTIVE EACH ADDITIONAL VESSEL  04/18/2020  . IR ANGIOGRAM VISCERAL SELECTIVE  03/10/2020  . IR ANGIOGRAM VISCERAL SELECTIVE  03/21/2020  . IR ANGIOGRAM VISCERAL SELECTIVE  04/18/2020  . IR EMBO ARTERIAL NOT HEMORR HEMANG INC GUIDE ROADMAPPING  03/10/2020  . IR EMBO TUMOR ORGAN ISCHEMIA INFARCT INC GUIDE ROADMAPPING  03/21/2020  . IR EMBO TUMOR ORGAN ISCHEMIA INFARCT INC GUIDE ROADMAPPING  04/18/2020  . IR GENERIC HISTORICAL  01/19/2015   IR RADIOLOGIST EVAL & MGMT 01/19/2015 Jacqulynn Cadet, MD [facility] INTERV RAD   . IR GENERIC HISTORICAL  07/23/2016   IR RADIOLOGIST EVAL & MGMT 07/23/2016 Jacqulynn Cadet, MD [facility] INTERV RAD  . IR GENERIC HISTORICAL  05/30/2015   IR RADIOLOGIST EVAL & MGMT 05/30/2015 [facility] INTERV RAD  . IR GENERIC HISTORICAL  10/04/2014   IR RADIOLOGIST EVAL & MGMT 10/04/2014 Jacqulynn Cadet, MD [facility] INTERV RAD  . IR GENERIC HISTORICAL  02/11/2017   IR RADIOLOGIST EVAL & MGMT 02/11/2017 Jacqulynn Cadet, MD [facility] INTERV RAD  . IR PARACENTESIS  10/23/2020  . IR RADIOLOGIST EVAL & MGMT  08/13/2017  . IR RADIOLOGIST EVAL & MGMT  08/11/2018  . IR RADIOLOGIST EVAL & MGMT  02/09/2019  . IR RADIOLOGIST EVAL & MGMT  02/10/2020  . IR RADIOLOGIST EVAL & MGMT  05/11/2020  . IR RADIOLOGIST EVAL & MGMT  08/09/2020  . IR RADIOLOGIST EVAL & MGMT  12/27/2020  . IR US GUIDE VASC ACCESS LEFT  03/10/2020  . IR US GUIDE VASC ACCESS LEFT  03/21/2020  . IR US GUIDE VASC ACCESS LEFT  04/18/2020  .  IR US GUIDE VASC ACCESS RIGHT  03/21/2020  . LOWER EXTREMITY ANGIOGRAM Bilateral 12/27/2013   Procedure: LOWER EXTREMITY ANGIOGRAM;  Surgeon: Conrad [place], MD;  Location: [facility] CATH LAB;  Service: Cardiovascular;  Laterality: Bilateral;       Family History  Problem Relation Age of Onset  . Diabetes Father   . Diabetes Brother   . Diabetes Brother     Social History   Tobacco Use  . Smoking status: Former Smoker    Packs/day: 0.50    Years: 35.00    Pack years: 17.50    Quit date: 08/21/2011    Years since quitting: 9.4  . Smokeless tobacco: Never Used  Vaping Use  . Vaping Use: Never used  Substance Use Topics  . Alcohol use: Not Currently    Alcohol/week: 0.0 standard drinks    Comment: Occasional beer on the weekends  . Drug use: No    Types: Cocaine    Comment: abused cocaine in the 80's    Home Medications Prior to Admission medications   Medication Sig Start Date End Date Taking? Authorizing Provider  albuterol (VENTOLIN HFA) 108 (90 Base) MCG/ACT inhaler Inhale 2 puffs into the lungs every 6  (six) hours as needed for wheezing or shortness of breath. 07/04/20  Yes Azzie Glatter, FNP  ALPRAZolam Duanne Moron) 1 MG tablet Take 1 mg by mouth 2 (two) times daily.   Yes [provider]  bismuth subsalicylate (PEPTO BISMOL) 262 MG/15ML suspension Take 30 mLs by mouth every 6 (six) hours as needed for diarrhea or loose stools.   Yes [provider]  Dextromethorphan-guaiFENesin (ROBITUSSIN COUGH+CHEST CONG DM) 20-200 MG/20ML LIQD Take 10 mLs by mouth every 4 (four) hours as needed (cough).   Yes [provider]  furosemide (LASIX) 20 MG tablet Take 1 tablet (20 mg total) by mouth daily. 11/22/20  Yes Azzie Glatter, FNP  gabapentin (NEURONTIN) 300 MG capsule Take 1 capsule (300 mg total) by mouth 3 (three) times daily. 06/06/20  Yes Azzie Glatter, FNP  pantoprazole (PROTONIX) 40 MG tablet TAKE 1 TABLET(40 MG) BY MOUTH TWICE DAILY BEFORE A MEAL Patient taking differently: Take 40 mg by mouth 2 (two) times daily. 11/22/20  Yes Azzie Glatter, FNP  potassium chloride SA (KLOR-CON) 20 MEQ tablet Take 1 tablet (20 mEq total) by mouth daily. 12/22/20  Yes Truitt Merle, MD  spironolactone (ALDACTONE) 25 MG tablet Take 3 tablets (75 mg total) by mouth daily. 10/07/20  Yes Nuala Alpha A, PA-C  loperamide (IMODIUM) 2 MG capsule Take 1-2 capsules (2-4 mg total) by mouth as needed for diarrhea or loose stools. Patient not taking: No sig reported 11/30/20   Alla Feeling, NP  ondansetron (ZOFRAN) 8 MG tablet Take 1 tablet (8 mg total) by mouth every 8 (eight) hours as needed for nausea or vomiting. Patient not taking: Reported on 02/11/2021 11/30/20   Alla Feeling, NP    Allergies    Aspirin, Nsaids, Folic acid, and Oxycodone  Review of Systems   Review of Systems  Constitutional: Negative for chills, diaphoresis, fatigue and fever.  HENT: Negative for congestion, sore throat and trouble swallowing.   Eyes: Negative for pain and visual disturbance.  Respiratory:  Positive for shortness of breath. Negative for cough and wheezing.   Cardiovascular: Positive for leg swelling. Negative for chest pain and palpitations.  Gastrointestinal: Negative for abdominal distention, abdominal pain, diarrhea, nausea and vomiting.  Genitourinary: Negative for difficulty urinating.  Musculoskeletal: Positive  for arthralgias and myalgias. Negative for back pain, neck pain and neck stiffness.  Skin: Negative for pallor.  Neurological: Negative for dizziness, speech difficulty, weakness and headaches.  Psychiatric/Behavioral: Negative for confusion.    Physical Exam Updated Vital Signs BP (!) 103/47   Pulse 92   Temp (!) 97.5 F (36.4 C) (Oral)   Resp 17   Ht '5\' 8"'  (1.727 m)   Wt 86.2 kg   SpO2 100%   BMI 28.89 kg/m   Physical Exam Constitutional:      General: He is in acute distress.     Appearance: Normal appearance. He is not ill-appearing, toxic-appearing or diaphoretic.  HENT:     Head: Normocephalic and atraumatic.     Mouth/Throat:     Mouth: Mucous membranes are moist.     Pharynx: Oropharynx is clear.  Eyes:     General: No scleral icterus.    Extraocular Movements: Extraocular movements intact.     Pupils: Pupils are equal, round, and reactive to light.  Cardiovascular:     Rate and Rhythm: Normal rate and regular rhythm.     Pulses: Normal pulses.     Heart sounds: Normal heart sounds.  Pulmonary:     Effort: Pulmonary effort is normal. No respiratory distress.     Breath sounds: Normal breath sounds. No stridor. No wheezing, rhonchi or rales.  Chest:     Chest wall: No tenderness.  Abdominal:     General: Abdomen is flat. There is no distension.     Palpations: Abdomen is soft.     Tenderness: There is no abdominal tenderness. There is no guarding or rebound.  Musculoskeletal:        General: No swelling. Normal range of motion.     Cervical back: Normal range of motion and neck supple. No rigidity.     Right lower leg: Tenderness  present. No edema.     Left lower leg: Tenderness present. Pitting Edema present.     Comments: No cervical, midline or thoracic spine tenderness.  Normal range of motion to back.  Upper extremities without any abnormality, normal range of motion. Left lower extremity without tenderness to hip, able to lift leg normally, no pain with this.  Compartments are soft.  Below knee there is tenderness throughout.  No tenderness palpation on actual knee joint.  No knee swelling, erythema or warmth.  Left leg with 3+ pitting edema from foot up until mid calf, is erythematous and slightly warm.  There is moderate ecchymosis on foot.  Right leg without any swelling or edema, however is tender to palpation throughout.  Compartments are soft with normal leg raise.  Right foot normal.  Bilateral PT pulses 2+.  Feet:     Comments: Hemorrhagic bullae left foot. Skin:    General: Skin is warm and dry.     Capillary Refill: Capillary refill takes less than 2 seconds.     Coloration: Skin is not pale.  Neurological:     General: No focal deficit present.     Mental Status: He is alert and oriented to person, place, and time.  Psychiatric:        Mood and Affect: Mood normal.        Behavior: Behavior normal.        ED Results / Procedures / Treatments   Labs (all labs ordered are listed, but only abnormal results are displayed) Labs Reviewed  LACTIC ACID, PLASMA - Abnormal; Notable for the following components:  Result Value   Lactic Acid, Venous 10.5 (*)    All other components within normal limits  LACTIC ACID, PLASMA - Abnormal; Notable for the following components:   Lactic Acid, Venous 9.6 (*)    All other components within normal limits  COMPREHENSIVE METABOLIC PANEL - Abnormal; Notable for the following components:   Sodium 134 (*)    Potassium 5.3 (*)    CO2 15 (*)    Glucose, Bld 46 (*)    BUN 43 (*)    Creatinine, Ser 2.86 (*)    Calcium 8.3 (*)    Total Protein 5.0 (*)    Albumin  2.0 (*)    AST 74 (*)    Alkaline Phosphatase 151 (*)    Total Bilirubin 3.9 (*)    GFR, Estimated 24 (*)    Anion gap 16 (*)    All other components within normal limits  CBC WITH DIFFERENTIAL/PLATELET - Abnormal; Notable for the following components:   WBC 14.4 (*)    RBC 3.62 (*)    Hemoglobin 12.2 (*)    HCT 38.4 (*)    MCV 106.1 (*)    RDW 17.9 (*)    Platelets 48 (*)    Neutro Abs 13.0 (*)    Lymphs Abs 0.4 (*)    Abs Immature Granulocytes 0.19 (*)    All other components within normal limits  PROTIME-INR - Abnormal; Notable for the following components:   Prothrombin Time 23.4 (*)    INR 2.2 (*)    All other components within normal limits  APTT - Abnormal; Notable for the following components:   aPTT 40 (*)    All other components within normal limits  BRAIN NATRIURETIC PEPTIDE - Abnormal; Notable for the following components:   B Natriuretic Peptide 785.5 (*)    All other components within normal limits  AMMONIA - Abnormal; Notable for the following components:   Ammonia 37 (*)    All other components within normal limits  SARS CORONAVIRUS 2 BY RT PCR (HOSPITAL ORDER, [facility])  CULTURE, BLOOD (SINGLE)  URINE CULTURE  URINALYSIS, ROUTINE W REFLEX MICROSCOPIC  TROPONIN I (HIGH SENSITIVITY)  TROPONIN I (HIGH SENSITIVITY)    EKG EKG Interpretation  Date/Time:  Saturday January 20 2021 14:36:10 EST Ventricular Rate:  86 PR Interval:    QRS Duration: 93 QT Interval:  389 QTC Calculation: 463 R Axis:   -30 Text Interpretation: Sinus rhythm Ventricular premature complex Left axis deviation Low voltage, extremity and precordial leads No significant change since last tracing Confirmed by Calvert Cantor [phone]) on 01/18/2021 3:21:05 PM   Radiology DG Tibia/Fibula Left  Result Date: 02/01/2021 CLINICAL DATA:  Fall EXAM: LEFT TIBIA AND FIBULA - 2 VIEW COMPARISON:  None. FINDINGS: No acute fracture or dislocation. Joint spaces and  alignment are maintained. No area of erosion or osseous destruction. No unexpected radiopaque foreign body. Vascular calcifications. IMPRESSION: No acute fracture or dislocation. Electronically Signed   By: Valentino Saxon MD   On: 01/19/2021 17:41   DG Tibia/Fibula Right  Result Date: 01/19/2021 CLINICAL DATA:  Pain EXAM: RIGHT TIBIA AND FIBULA - 2 VIEW COMPARISON:  None. FINDINGS: Osteopenia. No acute fracture or dislocation. Joint spaces and alignment are maintained. No area of erosion or osseous destruction. No unexpected radiopaque foreign body. Vascular calcifications. IMPRESSION: No acute fracture or dislocation. Electronically Signed   By: Valentino Saxon MD   On: 02/09/2021 17:44   DG Chest Port 1 [id number]  Result Date: 02/07/2021 CLINICAL DATA:  Fall EXAM: PORTABLE CHEST 1 VIEW COMPARISON:  November 02, 2009 FINDINGS: The cardiomediastinal silhouette is unchanged in contour. Small LEFT pleural effusion versus pleural blunting. No pneumothorax. No acute pleuroparenchymal abnormality. Visualized abdomen is unremarkable. No acute osseous abnormality. Vascular calcifications. IMPRESSION: No acute cardiopulmonary abnormality. Electronically Signed   By: Valentino Saxon MD   On: 01/22/2021 17:43   DG Foot Complete Left  Result Date: 02/07/2021 CLINICAL DATA:  Cough EXAM: LEFT FOOT - COMPLETE 3+ VIEW COMPARISON:  None. FINDINGS: Osteopenia No acute fracture or dislocation. Joint spaces and alignment are maintained. No area of erosion or osseous destruction. No unexpected radiopaque foreign body. Vascular calcifications. IMPRESSION: No acute fracture or dislocation. Electronically Signed   By: Valentino Saxon MD   On: 02/07/2021 17:42   DG Foot Complete Right  Result Date: 02/07/2021 CLINICAL DATA:  Pain EXAM: RIGHT FOOT COMPLETE - 3+ VIEW COMPARISON:  March 15, 2015 FINDINGS: Osteopenia. There is a nondisplaced fracture of the fifth metatarsal shaft. Is mildly comminuted appearance. No  intra-articular extension. Minimal degenerative changes of the first MTP. No additional fracture noted. Vascular calcifications. IMPRESSION: Nondisplaced fracture of the fifth metatarsal shaft. Electronically Signed   By: Valentino Saxon MD   On: 01/25/2021 17:46   DG Hip Unilat With Pelvis 2-3 Views Left  Result Date: 01/26/2021 CLINICAL DATA:  Fall EXAM: DG HIP (WITH OR WITHOUT PELVIS) 2-3V LEFT COMPARISON:  October 07, 2020 FINDINGS: No acute fracture or dislocation. Chronic deformity of the RIGHT pubic ramus consistent with sequela of remote prior trauma. Degenerative changes of the lower lumbar spine. No area of erosion or osseous destruction. No unexpected radiopaque foreign body. Vascular calcifications. IMPRESSION: No acute fracture or dislocation. If persistent concern for nondisplaced hip or pelvic fracture, recommend dedicated pelvic MRI. Electronically Signed   By: Valentino Saxon MD   On: 02/09/2021 17:39   VAS [place] LOWER EXTREMITY VENOUS (DVT) ([facility] and [facility] 7a-7p)  Result Date: 02/10/2021  Lower Venous DVT Study Indications: Pain, and Swelling.  Risk Factors: Cancer newly diagnosed preepiglottic mass, recurrent multifocal hepatocellular carcinoma, and invasiveesquamous cell carcinoma. Rrecent fall. Left leg pain X 3 weeks. Ascites. Limitations: Edema, patient's pain. Comparison Study: Prior negative bilateral LEV duplex from 04/22/14, is                   available Performing Technologist: Sharion Dove RVS  Examination Guidelines: A complete evaluation includes B-mode imaging, spectral Doppler, color Doppler, and power Doppler as needed of all accessible portions of each vessel. Bilateral testing is considered an integral part of a complete examination. Limited examinations for reoccurring indications may be performed as noted. The reflux portion of the exam is performed with the patient in reverse Trendelenburg.  +---------+---------------+---------+-----------+----------+---------------+  RIGHT    CompressibilityPhasicitySpontaneityPropertiesThrombus Aging  +---------+---------------+---------+-----------+----------+---------------+ CFV      Full           Yes      Yes                                  +---------+---------------+---------+-----------+----------+---------------+ SFJ      Full                                                         +---------+---------------+---------+-----------+----------+---------------+  FV Prox  Full                                                         +---------+---------------+---------+-----------+----------+---------------+ FV Mid   Full                                                         +---------+---------------+---------+-----------+----------+---------------+ FV DistalFull                                                         +---------+---------------+---------+-----------+----------+---------------+ PFV      Full                                                         +---------+---------------+---------+-----------+----------+---------------+ POP      Full           Yes      Yes                                  +---------+---------------+---------+-----------+----------+---------------+ PTV                                                   patent by color +---------+---------------+---------+-----------+----------+---------------+ PERO                                                  patent by color +---------+---------------+---------+-----------+----------+---------------+   +---------+---------------+---------+-----------+----------+-------------------+ LEFT     CompressibilityPhasicitySpontaneityPropertiesThrombus Aging      +---------+---------------+---------+-----------+----------+-------------------+ CFV      Full           Yes      Yes                                      +---------+---------------+---------+-----------+----------+-------------------+  SFJ      Full                                                             +---------+---------------+---------+-----------+----------+-------------------+ FV Prox                 Yes      Yes  patent by color and                                                       Doppler             +---------+---------------+---------+-----------+----------+-------------------+ FV Mid   Full                                                             +---------+---------------+---------+-----------+----------+-------------------+ FV Distal               Yes      Yes                  patent by color and                                                       Doppler             +---------+---------------+---------+-----------+----------+-------------------+ POP                     Yes      Yes                  patent by color and                                                       Doppler             +---------+---------------+---------+-----------+----------+-------------------+ PTV                                                   patent by color and                                                       Doppler             +---------+---------------+---------+-----------+----------+-------------------+ PERO                                                  Not well visualized +---------+---------------+---------+-----------+----------+-------------------+    Summary: RIGHT: - There is no evidence of deep vein thrombosis in the lower extremity. However, portions of this examination were limited- see technologist comments above.  LEFT: - There is no evidence of deep vein thrombosis in the lower extremity. However, portions of this examination were limited- see technologist comments above.  *  See table(s) above for measurements and observations.    Preliminary     Procedures .Critical Care Performed by: Alfredia Client, PA-C Authorized  by: Alfredia Client, PA-C   Critical care provider statement:    Critical care time (minutes):  45   Critical care was time spent personally by me on the following activities:  Discussions with consultants, evaluation of patient's response to treatment, examination of patient, ordering and performing treatments and interventions, ordering and review of laboratory studies, ordering and review of radiographic studies, pulse oximetry, re-evaluation of patient's condition, obtaining history from patient or surrogate and review of old charts     Medications Ordered in ED Medications  lactated ringers infusion ( Intravenous New Bag/Given 02/08/2021 1526)  fentaNYL (SUBLIMAZE) injection 50 mcg (50 mcg Intravenous Given 01/25/2021 1900)  ceFEPIme (MAXIPIME) 2 g in sodium chloride 0.9 % 100 mL IVPB (has no administration in time range)  vancomycin (VANCOCIN) IVPB 1000 mg/200 mL premix (has no administration in time range)  lactated ringers bolus 1,000 mL (0 mLs Intravenous Stopped 02/12/2021 1549)    And  lactated ringers bolus 1,000 mL (0 mLs Intravenous Stopped 01/21/2021 1643)    And  lactated ringers bolus 250 mL (0 mLs Intravenous Stopped 02/11/2021 1736)  ceFEPIme (MAXIPIME) 2 g in sodium chloride 0.9 % 100 mL IVPB (0 g Intravenous Stopped 02/05/2021 1736)  fentaNYL (SUBLIMAZE) injection 50 mcg (50 mcg Intravenous Given 02/01/2021 1639)  vancomycin (VANCOREADY) IVPB 2000 mg/400 mL (0 mg Intravenous Stopped 02/10/2021 1935)  lactated ringers bolus 1,000 mL ( Intravenous Restarted 02/06/2021 2033)  albumin human 25 % solution 100 g (100 g Intravenous New Bag/Given 01/28/2021 2130)    ED Course  I have reviewed the triage vital signs and the nursing notes.  Pertinent labs & imaging results that were available during my care of the patient were reviewed by me and considered in my medical decision making (see chart for details).    MDM Rules/Calculators/A&P                          Jeffrey Snow is a 65 y.o. male with past  medical history of alcoholic cirrhosis with HCC, hepatitis C, hypertension, is currently being worked up for preepiglottic mass with Dr. Lucia Gaskins that presents to the emergency department for fall on left side today, lower extremity leg pain, worse on left side for the last 3 weeks and new onset shortness of breath.  In regards to fall, will obtain plain films of left side, no bony tenderness noted.  Do not suspect any hip injury, patient has normal range of motion to hip and knee.  There are some mild ecchymosis of foot, will obtain plain films of this as well.  Did not hit his head, no LOC is not on blood thinners.  In regards to lower extremity pain, high suspicion for cellulitis on left side.  We will also obtain DVT studies, patient is high risk.. Also obtain basic blood work, patient currently with blood pressure of 83/53, fluids started.  Septic order set initiated, 30 cc/kg fluid initiated.  Awaiting blood work.  In regards to new onset shortness of breath, patient is not hypoxic, does not have any URI symptoms.  Covid test pending.  Does appear fluid overloaded.  Work-up today shows CBC of 14.4, did start broad-spectrum antibiotics. 30cc/kg fluids running. Lactic acid at 10.5, most likely from sepsis, could also be from liver failure.  Coags elevated, have been worsening  for the past 10 months.  Troponin at 9.  CMP with electrolyte derangements consistent with potassium of 5.3, CO2 of 15, creatinine of 2.86, patient does have AKI with baseline at 0.86.  Total bili and alk phos elevated.  DVT study is negative.  Plain films show no acute fractures besides right fifth metatarsal fracture.  Upon reevaluation, blood pressure has improved to 98 systolicPatient to be admitted for septic picture most likely from left leg cellulitis in addition to worsening liver failure.  Patient also with AKI, BNP elevated.  Covid negative.  Spoke to Dr. Dione Plover who will admit the patient.  The patient appears  reasonably stabilized for admission considering the current resources, flow, and capabilities available in the ED at this time, and I doubt any other [facility] requiring further screening and/or treatment in the ED prior to admission.  I discussed this case with my attending physician who cosigned this note including patient's presenting symptoms, physical exam, and planned diagnostics and interventions. Attending physician stated agreement with plan or made changes to plan which were implemented.   Attending physician assessed patient at bedside.    Final Clinical Impression(s) / ED Diagnoses Final diagnoses:  Sepsis due to cellulitis [facility])  Liver failure without hepatic coma, unspecified chronicity ([place])  AKI (acute kidney injury) [facility])    Rx / DC Orders ED Discharge Orders    None       Alfredia Client, PA-C 02/02/2021 2227    Truddie Hidden, MD 01/21/21 1356

## 2021-01-20 NOTE — Progress Notes (Signed)
VASCULAR LAB    Bilateral lower extremity venous duplex has been performed.  See CV proc for preliminary results.   Marli Diego, RVT 02/11/2021, 4:14 PM

## 2021-01-20 NOTE — Progress Notes (Signed)
Pharmacy Antibiotic Note  Jeffrey Snow is a 65 y.o. male admitted on 02/08/2021 with sepsis.  Pharmacy has been consulted for cefepime and vancomycin dosing.  Cefepime 2 g IV x 1 administered in ED (02/03/2021 1616) Vancomycin 2 g IV x 1 administered in ED (02/09/2021 1735)  Plan: Cefepime 2 g IV every 24 hours Vancomycin 1000 mg IV every 24 hours (Calculated AUC 529 using Scr 2.86, TBW, Vd 0.72 L/kg) Monitor clinical picture, renal function, vancomycin level if indicated F/U C&S, abx deescalation / LOT   Height: 5\' 8"  (172.7 cm) Weight: 86.2 kg (190 lb) IBW/kg (Calculated) : 68.4  Temp (24hrs), Avg:97.5 F (36.4 C), Min:97.5 F (36.4 C), Max:97.5 F (36.4 C)  Recent Labs  Lab 01/18/2021 1509 02/02/2021 1807  WBC 14.4*  --   CREATININE 2.86*  --   LATICACIDVEN 10.5* 9.6*    Estimated Creatinine Clearance: 27.5 mL/min (A) (by C-G formula based on SCr of 2.86 mg/dL (H)).    Allergies  Allergen Reactions  . Aspirin Other (See Comments)    GI bleeding  . Nsaids Other (See Comments)    Gi bleeding  . Folic Acid Other (See Comments)    Makes stomach feel funny  . Oxycodone Itching and Other (See Comments)    Cause insomnia      Microbiology results: 2/5 BCx: sent  Thank you for allowing pharmacy to be a part of this patient's care.  Efraim Kaufmann, PharmD, BCPS 02/09/2021 8:56 PM

## 2021-01-21 ENCOUNTER — Other Ambulatory Visit: Payer: Self-pay

## 2021-01-21 DIAGNOSIS — N179 Acute kidney failure, unspecified: Secondary | ICD-10-CM | POA: Diagnosis not present

## 2021-01-21 DIAGNOSIS — L03116 Cellulitis of left lower limb: Secondary | ICD-10-CM

## 2021-01-21 DIAGNOSIS — K7031 Alcoholic cirrhosis of liver with ascites: Secondary | ICD-10-CM | POA: Diagnosis not present

## 2021-01-21 DIAGNOSIS — C220 Liver cell carcinoma: Secondary | ICD-10-CM | POA: Diagnosis not present

## 2021-01-21 LAB — COMPREHENSIVE METABOLIC PANEL
ALT: 24 U/L (ref 0–44)
AST: 51 U/L — ABNORMAL HIGH (ref 15–41)
Albumin: 3.1 g/dL — ABNORMAL LOW (ref 3.5–5.0)
Alkaline Phosphatase: 84 U/L (ref 38–126)
Anion gap: 15 (ref 5–15)
BUN: 42 mg/dL — ABNORMAL HIGH (ref 8–23)
CO2: 17 mmol/L — ABNORMAL LOW (ref 22–32)
Calcium: 8.3 mg/dL — ABNORMAL LOW (ref 8.9–10.3)
Chloride: 103 mmol/L (ref 98–111)
Creatinine, Ser: 2.31 mg/dL — ABNORMAL HIGH (ref 0.61–1.24)
GFR, Estimated: 31 mL/min — ABNORMAL LOW (ref >60)
Glucose, Bld: 117 mg/dL — ABNORMAL HIGH (ref 70–99)
Potassium: 5.2 mmol/L — ABNORMAL HIGH (ref 3.5–5.1)
Sodium: 135 mmol/L (ref 135–145)
Total Bilirubin: 3.2 mg/dL — ABNORMAL HIGH (ref 0.3–1.2)
Total Protein: 5 g/dL — ABNORMAL LOW (ref 6.5–8.1)

## 2021-01-21 LAB — PROTIME-INR
INR: 2.9 — ABNORMAL HIGH (ref 0.8–1.2)
Prothrombin Time: 29.3 seconds — ABNORMAL HIGH (ref 11.4–15.2)

## 2021-01-21 LAB — LACTIC ACID, PLASMA
Lactic Acid, Venous: 7.7 mmol/L (ref 0.5–1.9)
Lactic Acid, Venous: 6.6 mmol/L (ref 0.5–1.9)

## 2021-01-21 LAB — GLUCOSE, CAPILLARY
Glucose-Capillary: 100 mg/dL — ABNORMAL HIGH (ref 70–99)
Glucose-Capillary: 103 mg/dL — ABNORMAL HIGH (ref 70–99)
Glucose-Capillary: 106 mg/dL — ABNORMAL HIGH (ref 70–99)
Glucose-Capillary: 96 mg/dL (ref 70–99)

## 2021-01-21 LAB — CBC
HCT: 30.4 % — ABNORMAL LOW (ref 39.0–52.0)
Hemoglobin: 9.6 g/dL — ABNORMAL LOW (ref 13.0–17.0)
MCH: 33.6 pg (ref 26.0–34.0)
MCHC: 31.6 g/dL (ref 30.0–36.0)
MCV: 106.3 fL — ABNORMAL HIGH (ref 80.0–100.0)
Platelets: 33 10*3/uL — ABNORMAL LOW (ref 150–400)
RBC: 2.86 MIL/uL — ABNORMAL LOW (ref 4.22–5.81)
RDW: 18.1 % — ABNORMAL HIGH (ref 11.5–15.5)
WBC: 10.2 10*3/uL (ref 4.0–10.5)
nRBC: 0 % (ref 0.0–0.2)

## 2021-01-21 LAB — MRSA PCR SCREENING: MRSA by PCR: NEGATIVE

## 2021-01-21 LAB — APTT: aPTT: 51 seconds — ABNORMAL HIGH (ref 24–36)

## 2021-01-21 MED ORDER — CHLORHEXIDINE GLUCONATE CLOTH 2 % EX PADS
6.0000 | MEDICATED_PAD | Freq: Every day | CUTANEOUS | Status: DC
  Administered 2021-01-21 – 2021-01-26 (×4): 6 via TOPICAL

## 2021-01-21 MED ORDER — SODIUM CHLORIDE 0.9% FLUSH
3.0000 mL | Freq: Two times a day (BID) | INTRAVENOUS | Status: DC
  Administered 2021-01-21 – 2021-01-29 (×16): 3 mL via INTRAVENOUS

## 2021-01-21 MED ORDER — ALBUTEROL SULFATE HFA 108 (90 BASE) MCG/ACT IN AERS
2.0000 | INHALATION_SPRAY | Freq: Four times a day (QID) | RESPIRATORY_TRACT | Status: DC | PRN
  Filled 2021-01-21 (×2): qty 6.7

## 2021-01-21 MED ORDER — CHLORHEXIDINE GLUCONATE CLOTH 2 % EX PADS: 6.0000 | MEDICATED_PAD | Freq: Once | CUTANEOUS | Status: DC

## 2021-01-21 MED ORDER — ONDANSETRON HCL 4 MG/2ML IJ SOLN
4.0000 mg | Freq: Four times a day (QID) | INTRAMUSCULAR | Status: DC | PRN
  Administered 2021-01-24: 4 mg via INTRAVENOUS
  Filled 2021-01-21: qty 2

## 2021-01-21 MED ORDER — POLYETHYLENE GLYCOL 3350 17 G PO PACK
17.0000 g | PACK | Freq: Every day | ORAL | Status: DC
  Administered 2021-01-21 – 2021-01-26 (×3): 17 g via ORAL
  Filled 2021-01-21 (×5): qty 1

## 2021-01-21 MED ORDER — GABAPENTIN 300 MG PO CAPS
300.0000 mg | ORAL_CAPSULE | Freq: Every day | ORAL | Status: DC
  Administered 2021-01-21 – 2021-01-26 (×6): 300 mg via ORAL
  Filled 2021-01-21 (×7): qty 1

## 2021-01-21 MED ORDER — ACETAMINOPHEN 500 MG PO TABS: 500.0000 mg | ORAL_TABLET | Freq: Four times a day (QID) | ORAL | Status: DC | PRN

## 2021-01-21 MED ORDER — CEFAZOLIN SODIUM-DEXTROSE 2-4 GM/100ML-% IV SOLN: 2.0000 g | INTRAVENOUS | Status: DC

## 2021-01-21 MED ORDER — PHYTONADIONE 5 MG PO TABS
10.0000 mg | ORAL_TABLET | Freq: Every day | ORAL | Status: AC
  Administered 2021-01-21 – 2021-01-23 (×3): 10 mg via ORAL
  Filled 2021-01-21 (×3): qty 2

## 2021-01-21 MED ORDER — LACTATED RINGERS IV BOLUS
500.0000 mL | Freq: Once | INTRAVENOUS | Status: AC
  Administered 2021-01-21: 500 mL via INTRAVENOUS

## 2021-01-21 MED ORDER — ONDANSETRON HCL 4 MG PO TABS: 4.0000 mg | ORAL_TABLET | Freq: Four times a day (QID) | ORAL | Status: DC | PRN

## 2021-01-21 MED ORDER — ALBUMIN HUMAN 25 % IV SOLN
50.0000 g | Freq: Once | INTRAVENOUS | Status: AC
  Administered 2021-01-21: 50 g via INTRAVENOUS
  Filled 2021-01-21: qty 200

## 2021-01-21 MED ORDER — DEXTROSE 50 % IV SOLN
INTRAVENOUS | Status: AC
  Administered 2021-01-21: 50 mL via INTRAVENOUS
  Filled 2021-01-21: qty 50

## 2021-01-21 MED ORDER — PANTOPRAZOLE SODIUM 40 MG PO TBEC
40.0000 mg | DELAYED_RELEASE_TABLET | Freq: Two times a day (BID) | ORAL | Status: DC
  Administered 2021-01-21 – 2021-01-26 (×11): 40 mg via ORAL
  Filled 2021-01-21 (×10): qty 1

## 2021-01-21 MED ORDER — SODIUM CHLORIDE 0.9 % IV SOLN
2.0000 g | Freq: Two times a day (BID) | INTRAVENOUS | Status: DC
  Administered 2021-01-21 – 2021-01-24 (×6): 2 g via INTRAVENOUS
  Filled 2021-01-21 (×7): qty 2

## 2021-01-21 MED ORDER — ALPRAZOLAM 1 MG PO TABS: 1.0000 mg | ORAL_TABLET | Freq: Two times a day (BID) | ORAL | Status: DC

## 2021-01-21 MED ORDER — ALPRAZOLAM 0.5 MG PO TABS
0.5000 mg | ORAL_TABLET | Freq: Two times a day (BID) | ORAL | Status: DC | PRN
  Administered 2021-01-21 – 2021-01-26 (×7): 0.5 mg via ORAL
  Filled 2021-01-21 (×8): qty 1

## 2021-01-21 NOTE — Progress Notes (Signed)
01/21/2021 Patient declined to take 0100 dose of Xanax. Patient aware that med ordered BID. He states "I only take 1/2 tab [0.5 mg] at home". Would like to have med ordered as 0.5mg  BID. Cindy S. Brigitte Pulse BSN, RN, [place] 01/21/2021 3:54 AM

## 2021-01-21 NOTE — Progress Notes (Incomplete)
01/21/2021 CRITICAL VALUE ALERT  Critical Value:  Lactic acid 6.6  Date & Time Notifed:  01/21/2021 0340  Provider Notified: Jonny Ruiz NP 01/21/2021 3:50 AM  Orders Received/Actions taken: ***

## 2021-01-21 NOTE — Progress Notes (Signed)
PHARMACY - PHYSICIAN COMMUNICATION CRITICAL VALUE ALERT - BLOOD CULTURE IDENTIFICATION (BCID)  Jeffrey Snow is an 65 y.o. male who presented to [facility] on 02/18/21 with a chief complaint of leg pain  Assessment: 1/2 aerobic bottle + GNR identified on gram stain. Organism not detected on BCID panel Presumed source, LLE cellulitis  Name of physician (or Provider) Contacted: Curly Rim via [facility]  Current antibiotics: vancomycin & cefepime  Changes to prescribed antibiotics recommended:  rec to DC vancomycin & continue cefepime. MD elected to continue vancomycin & cefepime for now.  No results found for this or any previous visit.  Eudelia Bunch, Pharm.D 01/21/2021 2:33 PM

## 2021-01-21 NOTE — Evaluation (Signed)
Clinical/Bedside Swallow Evaluation Patient Details  Name: Jeffrey Snow MRN: [id number] Date of Birth: [birth year]  Today's Date: 01/21/2021 Time: SLP Start Time (ACUTE ONLY): 5284 SLP Stop Time (ACUTE ONLY): 1443 SLP Time Calculation (min) (ACUTE ONLY): 11 min  Past Medical History:  Past Medical History:  Diagnosis Date  . Abnormal MRI of abdomen, liver  04/21/2014  . Anxiety   . Cirrhosis, alcoholic ([place]) 12/18/2438  . Claudication of left lower extremity ([place]) 10/2020  . Depression   . Jordan (hepatocellular carcinoma) ([place])   . Hearing deficit   . Hepatitis C   . Hypertension   . Liver mass   . Panic attack    Past Surgical History:  Past Surgical History:  Procedure Laterality Date  . ABDOMINAL AORTAGRAM N/A 12/27/2013   Procedure: ABDOMINAL Maxcine Ham;  Surgeon: Conrad Bowers, MD;  Location: [facility] CATH LAB;  Service: Cardiovascular;  Laterality: N/A;  . AORTOGRAM    . COSMETIC SURGERY     facial reconstructive surgery  . DIRECT LARYNGOSCOPY N/A 12/29/2020   Procedure: DIRECT LARYNGOSCOPY WITH BIOPSY;  Surgeon: Rozetta Nunnery, MD;  Location: [facility] OR;  Service: ENT;  Laterality: N/A;  . ESOPHAGOGASTRODUODENOSCOPY N/A 12/17/2014   Procedure: ESOPHAGOGASTRODUODENOSCOPY (EGD);  Surgeon: Missy Sabins, MD;  Location: [facility] ENDOSCOPY;  Service: Endoscopy;  Laterality: N/A;  . ESOPHAGOGASTRODUODENOSCOPY N/A 09/05/2015   Procedure: ESOPHAGOGASTRODUODENOSCOPY (EGD);  Surgeon: Wonda Horner, MD;  Location: [facility] ENDOSCOPY;  Service: Endoscopy;  Laterality: N/A;  . IR ANGIOGRAM EXTREMITY RIGHT  03/21/2020  . IR ANGIOGRAM SELECTIVE EACH ADDITIONAL VESSEL  03/10/2020  . IR ANGIOGRAM SELECTIVE EACH ADDITIONAL VESSEL  03/10/2020  . IR ANGIOGRAM SELECTIVE EACH ADDITIONAL VESSEL  03/10/2020  . IR ANGIOGRAM SELECTIVE EACH ADDITIONAL VESSEL  03/10/2020  . IR ANGIOGRAM SELECTIVE EACH ADDITIONAL VESSEL  03/10/2020  . IR ANGIOGRAM SELECTIVE EACH ADDITIONAL VESSEL  03/21/2020  . IR ANGIOGRAM SELECTIVE EACH  ADDITIONAL VESSEL  03/21/2020  . IR ANGIOGRAM SELECTIVE EACH ADDITIONAL VESSEL  03/21/2020  . IR ANGIOGRAM SELECTIVE EACH ADDITIONAL VESSEL  03/21/2020  . IR ANGIOGRAM SELECTIVE EACH ADDITIONAL VESSEL  03/21/2020  . IR ANGIOGRAM SELECTIVE EACH ADDITIONAL VESSEL  04/18/2020  . IR ANGIOGRAM SELECTIVE EACH ADDITIONAL VESSEL  04/18/2020  . IR ANGIOGRAM VISCERAL SELECTIVE  03/10/2020  . IR ANGIOGRAM VISCERAL SELECTIVE  03/21/2020  . IR ANGIOGRAM VISCERAL SELECTIVE  04/18/2020  . IR EMBO ARTERIAL NOT HEMORR HEMANG INC GUIDE ROADMAPPING  03/10/2020  . IR EMBO TUMOR ORGAN ISCHEMIA INFARCT INC GUIDE ROADMAPPING  03/21/2020  . IR EMBO TUMOR ORGAN ISCHEMIA INFARCT INC GUIDE ROADMAPPING  04/18/2020  . IR GENERIC HISTORICAL  01/19/2015   IR RADIOLOGIST EVAL & MGMT 01/19/2015 Jacqulynn Cadet, MD [facility] INTERV RAD  . IR GENERIC HISTORICAL  07/23/2016   IR RADIOLOGIST EVAL & MGMT 07/23/2016 Jacqulynn Cadet, MD [facility] INTERV RAD  . IR GENERIC HISTORICAL  05/30/2015   IR RADIOLOGIST EVAL & MGMT 05/30/2015 [facility] INTERV RAD  . IR GENERIC HISTORICAL  10/04/2014   IR RADIOLOGIST EVAL & MGMT 10/04/2014 Jacqulynn Cadet, MD [facility] INTERV RAD  . IR GENERIC HISTORICAL  02/11/2017   IR RADIOLOGIST EVAL & MGMT 02/11/2017 Jacqulynn Cadet, MD [facility] INTERV RAD  . IR PARACENTESIS  10/23/2020  . IR RADIOLOGIST EVAL & MGMT  08/13/2017  . IR RADIOLOGIST EVAL & MGMT  08/11/2018  . IR RADIOLOGIST EVAL & MGMT  02/09/2019  . IR RADIOLOGIST EVAL & MGMT  02/10/2020  . IR RADIOLOGIST EVAL & MGMT  05/11/2020  .  IR RADIOLOGIST EVAL & MGMT  08/09/2020  . IR RADIOLOGIST EVAL & MGMT  12/27/2020  . IR US GUIDE VASC ACCESS LEFT  03/10/2020  . IR US GUIDE VASC ACCESS LEFT  03/21/2020  . IR US GUIDE VASC ACCESS LEFT  04/18/2020  . IR US GUIDE VASC ACCESS RIGHT  03/21/2020  . LOWER EXTREMITY ANGIOGRAM Bilateral 12/27/2013   Procedure: LOWER EXTREMITY ANGIOGRAM;  Surgeon: Conrad Cold Bay, MD;  Location: [facility] CATH LAB;  Service: Cardiovascular;  Laterality: Bilateral;   HPI:  65  y.o. male with history of alcoholic cirrhosis with HCC, hepatitis C, hypertension, PAD, recently diagnosed epiglottic cancer, who presents with left leg pain after a fall.  Left lower extremity cellulitis with evidence for sepsis.  Hospitalized for further management.  Started on antibiotics. CXR 2/5 with no acute findings   Assessment / Plan / Recommendation Clinical Impression  Pt presents with mild risk of aspiration in setting of recent epiglottic cancer diagnosis.  Pt has baseline dysphonia.  Vocal quality very harsh, gravelly, and hoarse with low vocal intensity.  With administration of PO trials, vocal quality did not change or worse.  There was baseline cough which did not increase with PO trials. Pt appears to be tolerating current diet well.  Pt has continued oral intake with epiglottic mass (laryngoscopy 1/14) with no indication of pneumonia on CXR. Pt reports being advised to choose softer foods.  Pt is currently on renal diet and can self select appropriate textures.  Pt may benefit from MBSS to establish baseline level of swallow function prior to initiation of radiation therapy, or if coughing continues and is noted to increase with or shortly after PO intake. Pt may also benefit from education regarding anatomy and physiology of swallowing and expected changes during and after head and neck radiation therapy and/or prophylactic swallow exercise regimen. SLP to follow for diet tolerance and identification of potential additional needs in setting of H&N Ca.    Recommend continuing regular texture diet with thin liquids.  SLP Visit Diagnosis: Dysphagia, unspecified (R13.10)    Aspiration Risk  Mild aspiration risk    Diet Recommendation Regular;Thin liquid   Liquid Administration via: Cup;Straw Medication Administration: Whole meds with liquid Supervision: Patient able to self feed Compensations: Slow rate;Small sips/bites Postural Changes: Seated upright at 90 degrees    Other   Recommendations Oral Care Recommendations: Oral care BID   Follow up Recommendations Outpatient SLP      Frequency and Duration min 2x/week  2 weeks       Prognosis Prognosis for Safe Diet Advancement:  (N/A)      Swallow Study   General Date of Onset: 01-27-2021 HPI: 65 y.o. male with history of alcoholic cirrhosis with HCC, hepatitis C, hypertension, PAD, recently diagnosed epiglottic cancer, who presents with left leg pain after a fall.  Left lower extremity cellulitis with evidence for sepsis.  Hospitalized for further management.  Started on antibiotics. CXR 2/5 with no acute findings Type of Study: Bedside Swallow Evaluation Previous Swallow Assessment: none Diet Prior to this Study: Regular Temperature Spikes Noted: No Respiratory Status: Room air History of Recent Intubation: No Behavior/Cognition: Alert;Cooperative;[place] mood Oral Cavity Assessment: Within Functional Limits Oral Care Completed by SLP: No Oral Cavity - Dentition: Edentulous Self-Feeding Abilities: Able to feed self Patient Positioning: Upright in bed Baseline Vocal Quality: Hoarse;Low vocal intensity (Harsh;Gravelly) Volitional Cough:  ([place]) Volitional Swallow: Unable to elicit    Oral/Motor/Sensory Function Overall Oral Motor/Sensory Function: Within functional limits Facial ROM:  Within Functional Limits Facial Symmetry: Within Functional Limits Lingual ROM: Within Functional Limits Lingual Symmetry: Within Functional Limits Lingual Strength: Within Functional Limits Velum: Within Functional Limits Mandible: Within Functional Limits   Ice Chips Ice chips: Not tested   Thin Liquid Thin Liquid: Within functional limits Presentation: Cup;Straw    Nectar Thick Nectar Thick Liquid: Not tested   Honey Thick Honey Thick Liquid: Not tested   Puree Puree: Not tested   Solid     Solid: Within functional limits Presentation:  (SLP fed)      Celedonio Savage, [place], [place] Office: [phone] 01/21/2021,3:28 PM

## 2021-01-21 NOTE — Progress Notes (Signed)
Pharmacy Antibiotic Note  Jeffrey Snow is a 65 y.o. male admitted on February 02, 2021 with sepsis.  Pharmacy has been consulted for cefepime and vancomycin dosing. Cepime 2 g IV x 1 administered in ED (2021-02-02 1616) Vancomycin 2 g IV x 1 administered in ED (02-Feb-2021 1735) 01/21/2021 SCr 2.31, CrCl 31 ml/min AF; WBC down to 10.3. LA down to 6.6 GNR 1 in aerobic bottle of BCx  Plan: Increase Cefepime 2 g IV every 12 hours Vancomycin 1000 mg IV every 24 hours (Calculated AUC 529 using Scr 2.86, TBW, Vd 0.72 L/kg) Monitor clinical picture, renal function, vancomycin level if indicated F/U C&S, abx deescalation / LOT   Height: 5' 8.5" (174 cm) Weight: 83.2 kg (183 lb 6.8 oz) IBW/kg (Calculated) : 69.55  Temp (24hrs), Avg:97.8 F (36.6 C), Min:97.5 F (36.4 C), Max:98.3 F (36.8 C)  Recent Labs  Lab February 02, 2021 1509 02/02/21 1807 01/21/21 0004 01/21/21 0228  WBC 14.4*  --   --  10.2  CREATININE 2.86*  --   --  2.31*  LATICACIDVEN 10.5* 9.6* 7.7* 6.6*    Estimated Creatinine Clearance: 31.4 mL/min (A) (by C-G formula based on SCr of 2.31 mg/dL (H)).    Allergies  Allergen Reactions   Aspirin Other (See Comments)    GI bleeding   Nsaids Other (See Comments)    Gi bleeding   Folic Acid Other (See Comments)    Makes stomach feel funny   Oxycodone Itching and Other (See Comments)    Cause insomnia   Antimicrobials this admission:  2/5 Vanc> 2/5 cefepime>> Dose adjustments this admission:  2/6 cef inc to q12h Microbiology results:  2/5 MRSA - 2/5 Covid - 2/5 BCx: GNR aerobic bottle  Thank you for allowing pharmacy to be a part of this patients care.  Eudelia Bunch, Pharm.D 01/21/2021 11:11 AM

## 2021-01-21 NOTE — Progress Notes (Addendum)
[facility] PROGRESS NOTE   Jeffrey Snow [id number] DOB: [birth year] DOA: 02/01/2021  PCP: Azzie Glatter, FNP  Brief History/Interval Summary: 65 y.o. male with history of alcoholic cirrhosis with HCC, hepatitis C, hypertension, PAD, recently diagnosed epiglottic cancer, who presents with left leg pain after a fall.  Left lower extremity cellulitis with evidence for sepsis.  Hospitalized for further management.  Started on antibiotics.  Reason for Visit: Left lower extremity cellulitis  Consultants: None yet  Procedures: None yet  Antibiotics: Anti-infectives (From admission, onward)   Start     Dose/Rate Route Frequency Ordered Stop   01/21/21 1800  vancomycin (VANCOCIN) IVPB 1000 mg/200 mL premix        1,000 mg 200 mL/hr over 60 Minutes Intravenous Every 24 hours 02/02/2021 2055     01/21/21 1000  ceFEPIme (MAXIPIME) 2 g in sodium chloride 0.9 % 100 mL IVPB        2 g 200 mL/hr over 30 Minutes Intravenous Every 24 hours 01/16/2021 2050     01/21/21 0600  ceFAZolin (ANCEF) IVPB 2g/100 mL premix  Status:  Discontinued        2 g 200 mL/hr over 30 Minutes Intravenous On call to O.R. 01/21/21 0009 01/21/21 0145   02/06/2021 1645  vancomycin (VANCOREADY) IVPB 2000 mg/400 mL        2,000 mg 200 mL/hr over 120 Minutes Intravenous  Once 01/28/2021 1638 01/21/2021 1935   02/05/2021 1615  ceFEPIme (MAXIPIME) 2 g in sodium chloride 0.9 % 100 mL IVPB        2 g 200 mL/hr over 30 Minutes Intravenous  Once 02/02/2021 1612 01/28/2021 1736   01/19/2021 1615  vancomycin (VANCOCIN) IVPB 1000 mg/200 mL premix  Status:  Discontinued        1,000 mg 200 mL/hr over 60 Minutes Intravenous  Once 01/25/2021 1612 02/11/2021 1637      Subjective/Interval History: Patient is a poor historian.  Complains of pain in his left leg.  Denies any pain in his right foot.  Denies any abdominal pain nausea or vomiting.    Assessment/Plan:  Cellulitis of the left lower extremity with blistering of the left  foot/septic shock/lactic acidosis Patient presenting with infection involving his left leg.  Had significant elevated lactic acid level greater than 10.  Patient was also hypotensive initially.  Patient was volume resuscitated.  Started on broad-spectrum antibiotics.  No DVT noted on Doppler studies. Patient noted to have blistering over his left foot area.  No evidence for fluctuation.  May need to involve podiatry. Continue antibiotics for now.  Noted to be on vancomycin and cefepime.  Follow-up on cultures.  Lactic acid level is improving.  Acute kidney injury with hyperkalemia/anion gap metabolic acidosis Renal function was noted to be normal in January with a creatinine of 0.85.  Presented with BUN of 43 and creatinine of 2.86.  Continue aggressive hydration.  Monitor urine output.  Potassium level slightly better compared to yesterday.  Holding furosemide and spironolactone.  Recheck labs tomorrow.  History of liver cirrhosis/hepatitis C/hepatocellular carcinoma/ascites Patient has required paracentesis.  Abdomen noted to be distended but nontender.  Unlikely this is the reason for his infection.  Last paracentesis on 5/3 when 5 L of fluid were removed.  This was a therapeutic paracentesis.  Recently diagnosed epiglottic cancer Followed by Dr. Lucia Gaskins with ENT.  Not being considered for surgery.  Referred to radiation oncology.  Plan is for radiation treatments.  Also supposed to see medical  oncology.  Nondisplaced fracture of the right fifth metatarsal shaft Patient does not have any symptoms from this.  Will discuss with podiatry.  Coagulopathy INR noted to be 2.9.  This is most likely due to his liver disease.  Patient not on any anticoagulation.  Will give vitamin K.  Elevated BNP Does not appear to be volume overloaded.  Continue to follow clinically.  Macrocytic anemia and thrombocytopenia Hemoglobin lower than his usual baseline.  Might be due to dilution.  No evidence of overt  bleeding.  Platelet count stable.  Continue to monitor.  Check anemia panel.  TSH was normal back in October.  Vitamin B12 level was also normal in October.  History of neuropathy Gabapentin continued at a lower dose.  GERD Continue PPI  History of anxiety On alprazolam.  DVT Prophylaxis: SCDs Code Status: Full code Family Communication: No family at bedside Disposition Plan: Hopefully return home when improved.  Okay to transfer to progressive floor  Status is: Inpatient  Remains inpatient appropriate because:IV treatments appropriate due to intensity of illness or inability to take PO and Inpatient level of care appropriate due to severity of illness   Dispo: The patient is from: Home              Anticipated d/c is to: Home              Anticipated d/c date is: 3 days              Patient currently is not medically stable to d/c.   Difficult to place patient No     Medications:  Scheduled: . ALPRAZolam  1 mg Oral BID  . Chlorhexidine Gluconate Cloth  6 each Topical Daily  . gabapentin  300 mg Oral Daily  . pantoprazole  40 mg Oral BID AC  . polyethylene glycol  17 g Oral Daily  . sodium chloride flush  3 mL Intravenous Q12H   Continuous: . ceFEPime (MAXIPIME) IV    . dextrose 5% lactated ringers 150 mL/hr at 01/21/21 0615  . vancomycin     MPN:[id number], albuterol, fentaNYL (SUBLIMAZE) injection, ondansetron **OR** ondansetron (ZOFRAN) IV   Objective:  Vital Signs  Vitals:   01/21/21 0600 01/21/21 0700 01/21/21 0800 01/21/21 0824  BP: (!) 113/53 (!) 109/49 (!) 144/44   Pulse: 98 94 94   Resp: 15 15 19    Temp:    98.3 F (36.8 C)  TempSrc:    Axillary  SpO2: 95% 95% 97%   Weight:      Height:        Intake/Output Summary (Last 24 hours) at 01/21/2021 0937 Last data filed at 01/21/2021 0300 Gross per 24 hour  Intake 4187.22 ml  Output --  Net 4187.22 ml   Filed Weights   2021-01-26 2034 01/21/21 0004  Weight: 86.2 kg 83.2 kg    General  appearance: Awake alert.  In no distress.  Distracted Resp: Clear to auscultation bilaterally.  Normal effort Cardio: S1-S2 is normal regular.  No S3-S4.  No rubs murmurs or bruit GI: Abdomen is soft.  Noted to be distended but nontender.  Bowel sounds are present normal.  No masses organomegaly Extremities: Erythema involving the left lower leg.  Blistering noted over the left foot area. Neurologic:  No focal neurological deficits.    Lab Results:  Data Reviewed: I have personally reviewed following labs and imaging studies  CBC: Recent Labs  Lab 01-26-21 1509 01/21/21 0228  WBC 14.4* 10.2  NEUTROABS 13.0*  --  HGB 12.2* 9.6*  HCT 38.4* 30.4*  MCV 106.1* 106.3*  PLT 48* 33*    Basic Metabolic Panel: Recent Labs  Lab 02/08/2021 1509 01/21/21 0228  NA 134* 135  K 5.3* 5.2*  CL 103 103  CO2 15* 17*  GLUCOSE 46* 117*  BUN 43* 42*  CREATININE 2.86* 2.31*  CALCIUM 8.3* 8.3*    GFR: Estimated Creatinine Clearance: 31.4 mL/min (A) (by C-G formula based on SCr of 2.31 mg/dL (H)).  Liver Function Tests: Recent Labs  Lab 01/17/2021 1509 01/21/21 0228  AST 74* 51*  ALT 42 24  ALKPHOS 151* 84  BILITOT 3.9* 3.2*  PROT 5.0* 5.0*  ALBUMIN 2.0* 3.1*    Recent Labs  Lab 02/04/2021 1806  AMMONIA 37*    Coagulation Profile: Recent Labs  Lab 01/27/2021 1509 01/21/21 0228  INR 2.2* 2.9*     CBG: Recent Labs  Lab 01/25/2021 2355 01/21/21 0019 01/21/21 0112 01/21/21 0229 01/21/21 0350  GLUCAP 54* 100* 96 103* 106*     Recent Results (from the past 240 hour(s))  SARS Coronavirus 2 by RT PCR (hospital order, performed in [facility] hospital lab) Nasopharyngeal Nasopharyngeal Swab     Status: None   Collection Time: 02/01/2021  5:43 PM   Specimen: Nasopharyngeal Swab  Result Value Ref Range Status   SARS Coronavirus 2 NEGATIVE NEGATIVE Final    Comment: (NOTE) SARS-CoV-2 target nucleic acids are NOT DETECTED.  The SARS-CoV-2 RNA is generally detectable in upper  and lower respiratory specimens during the acute phase of infection. The lowest concentration of SARS-CoV-2 viral copies this assay can detect is 250 copies / mL. A negative result does not preclude SARS-CoV-2 infection and should not be used as the sole basis for treatment or other patient management decisions.  A negative result may occur with improper specimen collection / handling, submission of specimen other than nasopharyngeal swab, presence of viral mutation(s) within the areas targeted by this assay, and inadequate number of viral copies (<250 copies / mL). A negative result must be combined with clinical observations, patient history, and epidemiological information.  Fact Sheet for Patients:   StrictlyIdeas.no  Fact Sheet for Healthcare Providers: BankingDealers.co.za  This test is not yet approved or  cleared by the [place] FDA and has been authorized for detection and/or diagnosis of SARS-CoV-2 by FDA under an Emergency Use Authorization (EUA).  This EUA will remain in effect (meaning this test can be used) for the duration of the COVID-19 declaration under Section 564(b)(1) of the Act, 21 U.S.C. section 360bbb-3(b)(1), unless the authorization is terminated or revoked sooner.  Performed at [facility], [place] [address], [place], [place] [address]   MRSA PCR Screening     Status: None   Collection Time: 01/19/2021 11:52 PM   Specimen: Nasopharyngeal  Result Value Ref Range Status   MRSA by PCR NEGATIVE NEGATIVE Final    Comment:        The GeneXpert MRSA Assay (FDA approved for NASAL specimens only), is one component of a comprehensive MRSA colonization surveillance program. It is not intended to diagnose MRSA infection nor to guide or monitor treatment for MRSA infections. Performed at [facility], [place] [address], [place], [place] [id number]       Radiology  Studies: DG Tibia/Fibula Left  Result Date: 02/10/2021 CLINICAL DATA:  Fall EXAM: LEFT TIBIA AND FIBULA - 2 VIEW COMPARISON:  None. FINDINGS: No acute fracture or dislocation. Joint spaces and alignment are maintained.  No area of erosion or osseous destruction. No unexpected radiopaque foreign body. Vascular calcifications. IMPRESSION: No acute fracture or dislocation. Electronically Signed   By: Meda Klinefelter MD   On: January 25, 2021 17:41   DG Tibia/Fibula Right  Result Date: 2021/01/25 CLINICAL DATA:  Pain EXAM: RIGHT TIBIA AND FIBULA - 2 VIEW COMPARISON:  None. FINDINGS: Osteopenia. No acute fracture or dislocation. Joint spaces and alignment are maintained. No area of erosion or osseous destruction. No unexpected radiopaque foreign body. Vascular calcifications. IMPRESSION: No acute fracture or dislocation. Electronically Signed   By: Meda Klinefelter MD   On: 01-25-21 17:44   DG Chest Port 1 View  Result Date: Jan 25, 2021 CLINICAL DATA:  Fall EXAM: PORTABLE CHEST 1 VIEW COMPARISON:  November 02, 2009 FINDINGS: The cardiomediastinal silhouette is unchanged in contour. Small LEFT pleural effusion versus pleural blunting. No pneumothorax. No acute pleuroparenchymal abnormality. Visualized abdomen is unremarkable. No acute osseous abnormality. Vascular calcifications. IMPRESSION: No acute cardiopulmonary abnormality. Electronically Signed   By: Meda Klinefelter MD   On: 01-25-2021 17:43   DG Foot Complete Left  Result Date: 2021-01-25 CLINICAL DATA:  Cough EXAM: LEFT FOOT - COMPLETE 3+ VIEW COMPARISON:  None. FINDINGS: Osteopenia No acute fracture or dislocation. Joint spaces and alignment are maintained. No area of erosion or osseous destruction. No unexpected radiopaque foreign body. Vascular calcifications. IMPRESSION: No acute fracture or dislocation. Electronically Signed   By: Meda Klinefelter MD   On: 2021-01-25 17:42   DG Foot Complete Right  Result Date: 01/25/21 CLINICAL DATA:   Pain EXAM: RIGHT FOOT COMPLETE - 3+ VIEW COMPARISON:  March 15, 2015 FINDINGS: Osteopenia. There is a nondisplaced fracture of the fifth metatarsal shaft. Is mildly comminuted appearance. No intra-articular extension. Minimal degenerative changes of the first MTP. No additional fracture noted. Vascular calcifications. IMPRESSION: Nondisplaced fracture of the fifth metatarsal shaft. Electronically Signed   By: Meda Klinefelter MD   On: 25-Jan-2021 17:46   DG Hip Unilat With Pelvis 2-3 Views Left  Result Date: January 25, 2021 CLINICAL DATA:  Fall EXAM: DG HIP (WITH OR WITHOUT PELVIS) 2-3V LEFT COMPARISON:  October 07, 2020 FINDINGS: No acute fracture or dislocation. Chronic deformity of the RIGHT pubic ramus consistent with sequela of remote prior trauma. Degenerative changes of the lower lumbar spine. No area of erosion or osseous destruction. No unexpected radiopaque foreign body. Vascular calcifications. IMPRESSION: No acute fracture or dislocation. If persistent concern for nondisplaced hip or pelvic fracture, recommend dedicated pelvic MRI. Electronically Signed   By: Meda Klinefelter MD   On: 2021-01-25 17:39   VAS [place] LOWER EXTREMITY VENOUS (DVT) (MC and WL 7a-7p)  Result Date: 01/25/21  Lower Venous DVT Study Indications: Pain, and Swelling.  Risk Factors: Cancer newly diagnosed preepiglottic mass, recurrent multifocal hepatocellular carcinoma, and invasiveesquamous cell carcinoma. Rrecent fall. Left leg pain X 3 weeks. Ascites. Limitations: Edema, patient's pain. Comparison Study: Prior negative bilateral LEV duplex from 04/22/14, is                   available Performing Technologist: Sherren Kerns RVS  Examination Guidelines: A complete evaluation includes B-mode imaging, spectral Doppler, color Doppler, and power Doppler as needed of all accessible portions of each vessel. Bilateral testing is considered an integral part of a complete examination. Limited examinations for reoccurring indications may  be performed as noted. The reflux portion of the exam is performed with the patient in reverse Trendelenburg.  +---------+---------------+---------+-----------+----------+---------------+ RIGHT    CompressibilityPhasicitySpontaneityPropertiesThrombus Aging  +---------+---------------+---------+-----------+----------+---------------+ CFV  Full           Yes      Yes                                  +---------+---------------+---------+-----------+----------+---------------+ SFJ      Full                                                         +---------+---------------+---------+-----------+----------+---------------+ FV Prox  Full                                                         +---------+---------------+---------+-----------+----------+---------------+ FV Mid   Full                                                         +---------+---------------+---------+-----------+----------+---------------+ FV DistalFull                                                         +---------+---------------+---------+-----------+----------+---------------+ PFV      Full                                                         +---------+---------------+---------+-----------+----------+---------------+ POP      Full           Yes      Yes                                  +---------+---------------+---------+-----------+----------+---------------+ PTV                                                   patent by color +---------+---------------+---------+-----------+----------+---------------+ PERO                                                  patent by color +---------+---------------+---------+-----------+----------+---------------+   +---------+---------------+---------+-----------+----------+-------------------+ LEFT     CompressibilityPhasicitySpontaneityPropertiesThrombus Aging       +---------+---------------+---------+-----------+----------+-------------------+ CFV      Full           Yes      Yes                                      +---------+---------------+---------+-----------+----------+-------------------+  SFJ      Full                                                             +---------+---------------+---------+-----------+----------+-------------------+ FV Prox                 Yes      Yes                  patent by color and                                                       Doppler             +---------+---------------+---------+-----------+----------+-------------------+ FV Mid   Full                                                             +---------+---------------+---------+-----------+----------+-------------------+ FV Distal               Yes      Yes                  patent by color and                                                       Doppler             +---------+---------------+---------+-----------+----------+-------------------+ POP                     Yes      Yes                  patent by color and                                                       Doppler             +---------+---------------+---------+-----------+----------+-------------------+ PTV                                                   patent by color and                                                       Doppler             +---------+---------------+---------+-----------+----------+-------------------+  PERO                                                  Not well visualized +---------+---------------+---------+-----------+----------+-------------------+    Summary: RIGHT: - There is no evidence of deep vein thrombosis in the lower extremity. However, portions of this examination were limited- see technologist comments above.  LEFT: - There is no evidence of deep vein thrombosis in the lower  extremity. However, portions of this examination were limited- see technologist comments above.  *See table(s) above for measurements and observations.    Preliminary        LOS: 1 day   Junette Bernat [employer] on [email or web address]  01/21/2021, 9:37 AM

## 2021-01-22 ENCOUNTER — Inpatient Hospital Stay (HOSPITAL_COMMUNITY): Payer: Medicare Other

## 2021-01-22 ENCOUNTER — Encounter (HOSPITAL_COMMUNITY): Payer: Medicare Other

## 2021-01-22 DIAGNOSIS — I70299 Other atherosclerosis of native arteries of extremities, unspecified extremity: Secondary | ICD-10-CM | POA: Diagnosis not present

## 2021-01-22 DIAGNOSIS — I96 Gangrene, not elsewhere classified: Secondary | ICD-10-CM

## 2021-01-22 DIAGNOSIS — L0390 Cellulitis, unspecified: Secondary | ICD-10-CM

## 2021-01-22 DIAGNOSIS — K7031 Alcoholic cirrhosis of liver with ascites: Secondary | ICD-10-CM | POA: Diagnosis not present

## 2021-01-22 DIAGNOSIS — N179 Acute kidney failure, unspecified: Secondary | ICD-10-CM | POA: Diagnosis not present

## 2021-01-22 DIAGNOSIS — L03116 Cellulitis of left lower limb: Secondary | ICD-10-CM | POA: Diagnosis not present

## 2021-01-22 DIAGNOSIS — C220 Liver cell carcinoma: Secondary | ICD-10-CM | POA: Diagnosis not present

## 2021-01-22 LAB — CBC
HCT: 28.9 % — ABNORMAL LOW (ref 39.0–52.0)
Hemoglobin: 9.2 g/dL — ABNORMAL LOW (ref 13.0–17.0)
MCH: 33.2 pg (ref 26.0–34.0)
MCHC: 31.8 g/dL (ref 30.0–36.0)
MCV: 104.3 fL — ABNORMAL HIGH (ref 80.0–100.0)
Platelets: 36 10*3/uL — ABNORMAL LOW (ref 150–400)
RBC: 2.77 MIL/uL — ABNORMAL LOW (ref 4.22–5.81)
RDW: 18.6 % — ABNORMAL HIGH (ref 11.5–15.5)
WBC: 10.2 10*3/uL (ref 4.0–10.5)
nRBC: 0 % (ref 0.0–0.2)

## 2021-01-22 LAB — BASIC METABOLIC PANEL
Anion gap: 12 (ref 5–15)
BUN: 47 mg/dL — ABNORMAL HIGH (ref 8–23)
CO2: 19 mmol/L — ABNORMAL LOW (ref 22–32)
Calcium: 8.4 mg/dL — ABNORMAL LOW (ref 8.9–10.3)
Chloride: 103 mmol/L (ref 98–111)
Creatinine, Ser: 2.16 mg/dL — ABNORMAL HIGH (ref 0.61–1.24)
GFR, Estimated: 33 mL/min — ABNORMAL LOW (ref >60)
Glucose, Bld: 109 mg/dL — ABNORMAL HIGH (ref 70–99)
Potassium: 5.2 mmol/L — ABNORMAL HIGH (ref 3.5–5.1)
Sodium: 134 mmol/L — ABNORMAL LOW (ref 135–145)

## 2021-01-22 LAB — RETICULOCYTES
Immature Retic Fract: 23.6 % — ABNORMAL HIGH (ref 2.3–15.9)
RBC.: 2.8 MIL/uL — ABNORMAL LOW (ref 4.22–5.81)
Retic Count, Absolute: 77 10*3/uL (ref 19.0–186.0)
Retic Ct Pct: 2.8 % (ref 0.4–3.1)

## 2021-01-22 LAB — LACTIC ACID, PLASMA: Lactic Acid, Venous: 3.1 mmol/L (ref 0.5–1.9)

## 2021-01-22 LAB — IRON AND TIBC: Iron: 11 ug/dL — ABNORMAL LOW (ref 45–182)

## 2021-01-22 LAB — FERRITIN: Ferritin: 660 ng/mL — ABNORMAL HIGH (ref 24–336)

## 2021-01-22 LAB — VITAMIN B12: Vitamin B-12: 1230 pg/mL — ABNORMAL HIGH (ref 180–914)

## 2021-01-22 LAB — FOLATE: Folate: 1.9 ng/mL — ABNORMAL LOW (ref >5.9)

## 2021-01-22 MED ORDER — FOLIC ACID 5 MG/ML IJ SOLN
1.0000 mg | Freq: Every day | INTRAMUSCULAR | Status: DC
  Administered 2021-01-22 – 2021-01-23 (×2): 1 mg via INTRAVENOUS
  Filled 2021-01-22 (×4): qty 0.2

## 2021-01-22 NOTE — Consult Note (Signed)
ORTHOPAEDIC CONSULTATION  REQUESTING PHYSICIAN: Bonnielee Haff, MD  Chief Complaint: left leg pain  HPI: Jeffrey Snow is a 65 y.o.malewith history of alcoholic cirrhosis with HCC, hepatitis C, hypertension, PAD, recently diagnosed epiglottic cancer, who presented to [facility] on 01/17/2021 with left leg pain after a fall. X-rays of the left leg were negative for acute fracture. Was found to have nondisplaced fracture of right fifth metatarsal shaft. He was admitted for sepsis likely secondary to left lower extremity cellulitis. He was started on antibiotics. Orthopedics was consulted for worsening symptoms.   Patient was seen in 1227. Patient resting in hospital bed. States he thinks his left leg feels some better. Left leg with dressing in place with some drainage noted. Right leg with chronic skin changes.   Past Medical History:  Diagnosis Date  . Abnormal MRI of abdomen, liver  04/21/2014  . Anxiety   . Cirrhosis, alcoholic ([place]) 0/0/1749  . Claudication of left lower extremity ([place]) 10/2020  . Depression   . [place] (hepatocellular carcinoma) ([place])   . Hearing deficit   . Hepatitis C   . Hypertension   . Liver mass   . Panic attack    Past Surgical History:  Procedure Laterality Date  . ABDOMINAL AORTAGRAM N/A 12/27/2013   Procedure: ABDOMINAL Maxcine Ham;  Surgeon: Conrad Eldorado, MD;  Location: [facility] CATH LAB;  Service: Cardiovascular;  Laterality: N/A;  . AORTOGRAM    . COSMETIC SURGERY     facial reconstructive surgery  . DIRECT LARYNGOSCOPY N/A 12/29/2020   Procedure: DIRECT LARYNGOSCOPY WITH BIOPSY;  Surgeon: Rozetta Nunnery, MD;  Location: [facility] OR;  Service: ENT;  Laterality: N/A;  . ESOPHAGOGASTRODUODENOSCOPY N/A 12/17/2014   Procedure: ESOPHAGOGASTRODUODENOSCOPY (EGD);  Surgeon: Missy Sabins, MD;  Location: [facility] ENDOSCOPY;  Service: Endoscopy;  Laterality: N/A;  . ESOPHAGOGASTRODUODENOSCOPY N/A 09/05/2015   Procedure: ESOPHAGOGASTRODUODENOSCOPY  (EGD);  Surgeon: Wonda Horner, MD;  Location: [facility] ENDOSCOPY;  Service: Endoscopy;  Laterality: N/A;  . IR ANGIOGRAM EXTREMITY RIGHT  03/21/2020  . IR ANGIOGRAM SELECTIVE EACH ADDITIONAL VESSEL  03/10/2020  . IR ANGIOGRAM SELECTIVE EACH ADDITIONAL VESSEL  03/10/2020  . IR ANGIOGRAM SELECTIVE EACH ADDITIONAL VESSEL  03/10/2020  . IR ANGIOGRAM SELECTIVE EACH ADDITIONAL VESSEL  03/10/2020  . IR ANGIOGRAM SELECTIVE EACH ADDITIONAL VESSEL  03/10/2020  . IR ANGIOGRAM SELECTIVE EACH ADDITIONAL VESSEL  03/21/2020  . IR ANGIOGRAM SELECTIVE EACH ADDITIONAL VESSEL  03/21/2020  . IR ANGIOGRAM SELECTIVE EACH ADDITIONAL VESSEL  03/21/2020  . IR ANGIOGRAM SELECTIVE EACH ADDITIONAL VESSEL  03/21/2020  . IR ANGIOGRAM SELECTIVE EACH ADDITIONAL VESSEL  03/21/2020  . IR ANGIOGRAM SELECTIVE EACH ADDITIONAL VESSEL  04/18/2020  . IR ANGIOGRAM SELECTIVE EACH ADDITIONAL VESSEL  04/18/2020  . IR ANGIOGRAM VISCERAL SELECTIVE  03/10/2020  . IR ANGIOGRAM VISCERAL SELECTIVE  03/21/2020  . IR ANGIOGRAM VISCERAL SELECTIVE  04/18/2020  . IR EMBO ARTERIAL NOT HEMORR HEMANG INC GUIDE ROADMAPPING  03/10/2020  . IR EMBO TUMOR ORGAN ISCHEMIA INFARCT INC GUIDE ROADMAPPING  03/21/2020  . IR EMBO TUMOR ORGAN ISCHEMIA INFARCT INC GUIDE ROADMAPPING  04/18/2020  . IR GENERIC HISTORICAL  01/19/2015   IR RADIOLOGIST EVAL & MGMT 01/19/2015 Jacqulynn Cadet, MD [facility] INTERV RAD  . IR GENERIC HISTORICAL  07/23/2016   IR RADIOLOGIST EVAL & MGMT 07/23/2016 Jacqulynn Cadet, MD [facility] INTERV RAD  . IR GENERIC HISTORICAL  05/30/2015   IR RADIOLOGIST EVAL & MGMT 05/30/2015 [facility] INTERV RAD  . IR GENERIC HISTORICAL  10/04/2014   IR RADIOLOGIST EVAL & MGMT 10/04/2014 Jacqulynn Cadet, MD [facility] INTERV RAD  . IR GENERIC HISTORICAL  02/11/2017   IR RADIOLOGIST EVAL & MGMT 02/11/2017 Jacqulynn Cadet, MD [facility] INTERV RAD  . IR PARACENTESIS  10/23/2020  . IR RADIOLOGIST EVAL & MGMT  08/13/2017  . IR RADIOLOGIST EVAL & MGMT  08/11/2018  . IR RADIOLOGIST EVAL & MGMT  02/09/2019  . IR  RADIOLOGIST EVAL & MGMT  02/10/2020  . IR RADIOLOGIST EVAL & MGMT  05/11/2020  . IR RADIOLOGIST EVAL & MGMT  08/09/2020  . IR RADIOLOGIST EVAL & MGMT  12/27/2020  . IR US GUIDE VASC ACCESS LEFT  03/10/2020  . IR US GUIDE VASC ACCESS LEFT  03/21/2020  . IR US GUIDE VASC ACCESS LEFT  04/18/2020  . IR US GUIDE VASC ACCESS RIGHT  03/21/2020  . LOWER EXTREMITY ANGIOGRAM Bilateral 12/27/2013   Procedure: LOWER EXTREMITY ANGIOGRAM;  Surgeon: Conrad [place], MD;  Location: [facility] CATH LAB;  Service: Cardiovascular;  Laterality: Bilateral;   Social History   Socioeconomic History  . Marital status: Single    Spouse name: Not on file  . Number of children: 3  . Years of education: Not on file  . Highest education level: Not on file  Occupational History    Employer: [employer]  Tobacco Use  . Smoking status: Former Smoker    Packs/day: 0.50    Years: 35.00    Pack years: 17.50    Quit date: 08/21/2011    Years since quitting: 9.4  . Smokeless tobacco: Never Used  Vaping Use  . Vaping Use: Never used  Substance and Sexual Activity  . Alcohol use: Not Currently    Alcohol/week: 0.0 standard drinks    Comment: Occasional beer on the weekends  . Drug use: No    Types: Cocaine    Comment: abused cocaine in the 80's  . Sexual activity: Not Currently  Other Topics Concern  . Not on file  Social History Narrative   Single, never married.  Lives alone.  3 daughters, 16 grandchildren who are not very involved in the patient's care.  Quit smoking and heavy drinking 3.5 years ago.    Social Determinants of Health   Financial Resource Strain: High Risk  . Difficulty of Paying Living Expenses: Hard  Food Insecurity: No Food Insecurity  . Worried About Charity fundraiser in the Last Year: Never true  . Ran Out of Food in the Last Year: Never true  Transportation Needs: No Transportation Needs  . Lack of Transportation (Medical): No  . Lack of Transportation (Non-Medical): No  Physical Activity: Inactive   . Days of Exercise per Week: 0 days  . Minutes of Exercise per Session: 0 min  Stress: Stress Concern Present  . Feeling of Stress : Very much  Social Connections: Not on file   Family History  Problem Relation Age of Onset  . Diabetes Father   . Diabetes Brother   . Diabetes Brother    Allergies  Allergen Reactions  . Aspirin Other (See Comments)    GI bleeding  . Nsaids Other (See Comments)    Gi bleeding  . Folic Acid Other (See Comments)    Makes stomach feel funny  . Oxycodone Itching and Other (See Comments)    Cause insomnia   Prior to Admission medications   Medication Sig Start Date End Date Taking? Authorizing Provider  albuterol (VENTOLIN HFA) 108 (90 Base) MCG/ACT inhaler Inhale  2 puffs into the lungs every 6 (six) hours as needed for wheezing or shortness of breath. 07/04/20  Yes Azzie Glatter, FNP  ALPRAZolam Duanne Moron) 1 MG tablet Take 1 mg by mouth 2 (two) times daily.   Yes [provider]  bismuth subsalicylate (PEPTO BISMOL) 262 MG/15ML suspension Take 30 mLs by mouth every 6 (six) hours as needed for diarrhea or loose stools.   Yes [provider]  Dextromethorphan-guaiFENesin (ROBITUSSIN COUGH+CHEST CONG DM) 20-200 MG/20ML LIQD Take 10 mLs by mouth every 4 (four) hours as needed (cough).   Yes [provider]  furosemide (LASIX) 20 MG tablet Take 1 tablet (20 mg total) by mouth daily. 11/22/20  Yes Azzie Glatter, FNP  gabapentin (NEURONTIN) 300 MG capsule Take 1 capsule (300 mg total) by mouth 3 (three) times daily. 06/06/20  Yes Azzie Glatter, FNP  pantoprazole (PROTONIX) 40 MG tablet TAKE 1 TABLET(40 MG) BY MOUTH TWICE DAILY BEFORE A MEAL Patient taking differently: Take 40 mg by mouth 2 (two) times daily. 11/22/20  Yes Azzie Glatter, FNP  potassium chloride SA (KLOR-CON) 20 MEQ tablet Take 1 tablet (20 mEq total) by mouth daily. 12/22/20  Yes Truitt Merle, MD  spironolactone (ALDACTONE) 25 MG tablet Take 3 tablets (75 mg  total) by mouth daily. 10/07/20  Yes Nuala Alpha A, PA-C  loperamide (IMODIUM) 2 MG capsule Take 1-2 capsules (2-4 mg total) by mouth as needed for diarrhea or loose stools. Patient not taking: No sig reported 11/30/20   Alla Feeling, NP  ondansetron (ZOFRAN) 8 MG tablet Take 1 tablet (8 mg total) by mouth every 8 (eight) hours as needed for nausea or vomiting. Patient not taking: Reported on 01/28/2021 11/30/20   Alla Feeling, NP   DG Tibia/Fibula Left  Result Date: 01/16/2021 CLINICAL DATA:  Fall EXAM: LEFT TIBIA AND FIBULA - 2 VIEW COMPARISON:  None. FINDINGS: No acute fracture or dislocation. Joint spaces and alignment are maintained. No area of erosion or osseous destruction. No unexpected radiopaque foreign body. Vascular calcifications. IMPRESSION: No acute fracture or dislocation. Electronically Signed   By: Valentino Saxon MD   On: 02/07/2021 17:41   DG Tibia/Fibula Right  Result Date: 01/28/2021 CLINICAL DATA:  Pain EXAM: RIGHT TIBIA AND FIBULA - 2 VIEW COMPARISON:  None. FINDINGS: Osteopenia. No acute fracture or dislocation. Joint spaces and alignment are maintained. No area of erosion or osseous destruction. No unexpected radiopaque foreign body. Vascular calcifications. IMPRESSION: No acute fracture or dislocation. Electronically Signed   By: Valentino Saxon MD   On: 01/17/2021 17:44   CT TIBIA FIBULA LEFT WO CONTRAST  Result Date: 01/22/2021 CLINICAL DATA:  Left lower extremity pain and swelling. EXAM: CT OF THE LEFT FOOT WITHOUT CONTRAST TECHNIQUE: Multidetector CT imaging of the left foot was performed according to the standard protocol. Multiplanar CT image reconstructions were also generated. COMPARISON:  Radiographs 01/22/2021 FINDINGS: Severe skin thickening, subcutaneous soft tissue swelling/edema/fluid and scattered skin blisters all suggesting severe cellulitis. I do not see a definite focal drainable fluid collection to suggest an abscess examination is limited  without contrast. Suspect diffuse myofasciitis also without obvious findings pyomyositis. No findings suspicious for septic arthritis or osteomyelitis. There is a large focus of healed AVN or bone infarct involving the talus. IMPRESSION: 1. Severe cellulitis and probable diffuse myofasciitis without obvious findings for focal soft tissue abscess or pyomyositis. 2. No CT findings for septic arthritis or osteomyelitis. 3. If symptoms persist or worsen may be further  evaluation. 4. Large focus of healed AVN or bone infarct involving the talus. Electronically Signed   By: Marijo Sanes M.D.   On: 01/22/2021 13:15   CT FOOT LEFT WO CONTRAST  Result Date: 01/22/2021 CLINICAL DATA:  Left lower extremity pain and swelling. EXAM: CT OF THE LEFT FOOT WITHOUT CONTRAST TECHNIQUE: Multidetector CT imaging of the left foot was performed according to the standard protocol. Multiplanar CT image reconstructions were also generated. COMPARISON:  Radiographs 01/22/2021 FINDINGS: Severe skin thickening, subcutaneous soft tissue swelling/edema/fluid and scattered skin blisters all suggesting severe cellulitis. I do not see a definite focal drainable fluid collection to suggest an abscess examination is limited without contrast. Suspect diffuse myofasciitis also without obvious findings pyomyositis. No findings suspicious for septic arthritis or osteomyelitis. There is a large focus of healed AVN or bone infarct involving the talus. IMPRESSION: 1. Severe cellulitis and probable diffuse myofasciitis without obvious findings for focal soft tissue abscess or pyomyositis. 2. No CT findings for septic arthritis or osteomyelitis. 3. If symptoms persist or worsen may be further evaluation. 4. Large focus of healed AVN or bone infarct involving the talus. Electronically Signed   By: Marijo Sanes M.D.   On: 01/22/2021 13:15   DG Chest Port 1 View  Result Date: 01/16/2021 CLINICAL DATA:  Fall EXAM: PORTABLE CHEST 1 VIEW COMPARISON:   November 02, 2009 FINDINGS: The cardiomediastinal silhouette is unchanged in contour. Small LEFT pleural effusion versus pleural blunting. No pneumothorax. No acute pleuroparenchymal abnormality. Visualized abdomen is unremarkable. No acute osseous abnormality. Vascular calcifications. IMPRESSION: No acute cardiopulmonary abnormality. Electronically Signed   By: Valentino Saxon MD   On: 02/05/2021 17:43   DG Foot Complete Left  Result Date: 02/10/2021 CLINICAL DATA:  Cough EXAM: LEFT FOOT - COMPLETE 3+ VIEW COMPARISON:  None. FINDINGS: Osteopenia No acute fracture or dislocation. Joint spaces and alignment are maintained. No area of erosion or osseous destruction. No unexpected radiopaque foreign body. Vascular calcifications. IMPRESSION: No acute fracture or dislocation. Electronically Signed   By: Valentino Saxon MD   On: 01/23/2021 17:42   DG Foot Complete Right  Result Date: 01/23/2021 CLINICAL DATA:  Pain EXAM: RIGHT FOOT COMPLETE - 3+ VIEW COMPARISON:  March 15, 2015 FINDINGS: Osteopenia. There is a nondisplaced fracture of the fifth metatarsal shaft. Is mildly comminuted appearance. No intra-articular extension. Minimal degenerative changes of the first MTP. No additional fracture noted. Vascular calcifications. IMPRESSION: Nondisplaced fracture of the fifth metatarsal shaft. Electronically Signed   By: Valentino Saxon MD   On: 02/02/2021 17:46   VAS [place] ABI WITH/WO TBI  Result Date: 01/22/2021 LOWER EXTREMITY DOPPLER STUDY Indications: Rest pain, gangrene, and LLE cellulitis post injury/fall. High Risk Factors: Hypertension, past history of smoking. Other Factors: CA, Cirrhosis, hepatitis C, PAD.  Comparison Study: Previous study performed 09/03/2013 Performing Technologist: Rogelia Rohrer RVT, RDMS  Examination Guidelines: A complete evaluation includes at minimum, Doppler waveform signals and systolic blood pressure reading at the level of bilateral brachial, anterior tibial, and posterior  tibial arteries, when vessel segments are accessible. Bilateral testing is considered an integral part of a complete examination. Photoelectric Plethysmograph (PPG) waveforms and toe systolic pressure readings are included as required and additional duplex testing as needed. Limited examinations for reoccurring indications may be performed as noted.  ABI Findings: +--------+------------------+-----+----------+--------+ Right   Rt Pressure (mmHg)IndexWaveform  Comment  +--------+------------------+-----+----------+--------+ [id number]                    triphasic          +--------+------------------+-----+----------+--------+  PTA     52                0.39 monophasic         +--------+------------------+-----+----------+--------+ DP      59                0.44 monophasic         +--------+------------------+-----+----------+--------+ +--------+------------------+-----+----------+-------+ Left    Lt Pressure (mmHg)IndexWaveform  Comment +--------+------------------+-----+----------+-------+ [id number]                    triphasic         +--------+------------------+-----+----------+-------+ PTA     65                0.49 monophasic        +--------+------------------+-----+----------+-------+ DP      68                0.51 monophasic        +--------+------------------+-----+----------+-------+ +-------+-----------+-----------+------------+------------+ ABI/TBIToday's ABIToday's TBIPrevious ABIPrevious TBI +-------+-----------+-----------+------------+------------+ Right  0.44                  0.70                     +-------+-----------+-----------+------------+------------+ Left   0.51                  0.69                     +-------+-----------+-----------+------------+------------+  Summary: Right: Resting right ankle-brachial index indicates severe right lower extremity arterial disease. Left: Resting left ankle-brachial index indicates  severe left lower extremity arterial disease. ABI of LLE likely elevated due to infection, possibly underestimating level of disease.  *See table(s) above for measurements and observations.     Preliminary    DG Hip Unilat With Pelvis 2-3 Views Left  Result Date: 02/08/2021 CLINICAL DATA:  Fall EXAM: DG HIP (WITH OR WITHOUT PELVIS) 2-3V LEFT COMPARISON:  October 07, 2020 FINDINGS: No acute fracture or dislocation. Chronic deformity of the RIGHT pubic ramus consistent with sequela of remote prior trauma. Degenerative changes of the lower lumbar spine. No area of erosion or osseous destruction. No unexpected radiopaque foreign body. Vascular calcifications. IMPRESSION: No acute fracture or dislocation. If persistent concern for nondisplaced hip or pelvic fracture, recommend dedicated pelvic MRI. Electronically Signed   By: Valentino Saxon MD   On: 01/25/2021 17:39   VAS [place] LOWER EXTREMITY VENOUS (DVT) (MC and WL 7a-7p)  Result Date: 01/21/2021  Lower Venous DVT Study Indications: Pain, and Swelling.  Risk Factors: Cancer newly diagnosed preepiglottic mass, recurrent multifocal hepatocellular carcinoma, and invasiveesquamous cell carcinoma. Rrecent fall. Left leg pain X 3 weeks. Ascites. Limitations: Edema, patient's pain. Comparison Study: Prior negative bilateral LEV duplex from 04/22/14, is                   available Performing Technologist: Sharion Dove RVS  Examination Guidelines: A complete evaluation includes B-mode imaging, spectral Doppler, color Doppler, and power Doppler as needed of all accessible portions of each vessel. Bilateral testing is considered an integral part of a complete examination. Limited examinations for reoccurring indications may be performed as noted. The reflux portion of the exam is performed with the patient in reverse Trendelenburg.  +---------+---------------+---------+-----------+----------+---------------+ RIGHT     CompressibilityPhasicitySpontaneityPropertiesThrombus Aging  +---------+---------------+---------+-----------+----------+---------------+ CFV      Full           Yes  Yes                                  +---------+---------------+---------+-----------+----------+---------------+ SFJ      Full                                                         +---------+---------------+---------+-----------+----------+---------------+ FV Prox  Full                                                         +---------+---------------+---------+-----------+----------+---------------+ FV Mid   Full                                                         +---------+---------------+---------+-----------+----------+---------------+ FV DistalFull                                                         +---------+---------------+---------+-----------+----------+---------------+ PFV      Full                                                         +---------+---------------+---------+-----------+----------+---------------+ POP      Full           Yes      Yes                                  +---------+---------------+---------+-----------+----------+---------------+ PTV                                                   patent by color +---------+---------------+---------+-----------+----------+---------------+ PERO                                                  patent by color +---------+---------------+---------+-----------+----------+---------------+   +---------+---------------+---------+-----------+----------+-------------------+ LEFT     CompressibilityPhasicitySpontaneityPropertiesThrombus Aging      +---------+---------------+---------+-----------+----------+-------------------+ CFV      Full           Yes      Yes                                      +---------+---------------+---------+-----------+----------+-------------------+ SFJ       Full                                                             +---------+---------------+---------+-----------+----------+-------------------+  FV Prox                 Yes      Yes                  patent by color and                                                       Doppler             +---------+---------------+---------+-----------+----------+-------------------+ FV Mid   Full                                                             +---------+---------------+---------+-----------+----------+-------------------+ FV Distal               Yes      Yes                  patent by color and                                                       Doppler             +---------+---------------+---------+-----------+----------+-------------------+ POP                     Yes      Yes                  patent by color and                                                       Doppler             +---------+---------------+---------+-----------+----------+-------------------+ PTV                                                   patent by color and                                                       Doppler             +---------+---------------+---------+-----------+----------+-------------------+ PERO                                                  Not well visualized +---------+---------------+---------+-----------+----------+-------------------+     Summary: RIGHT: - There is no evidence of deep vein  thrombosis in the lower extremity. However, portions of this examination were limited- see technologist comments above.  LEFT: - There is no evidence of deep vein thrombosis in the lower extremity. However, portions of this examination were limited- see technologist comments above.  *See table(s) above for measurements and observations. Electronically signed by Monica Martinez MD on 01/21/2021 at 2:25:53 PM.    Final    Family  History Reviewed and non-contributory, no pertinent history of problems with bleeding or anesthesia      Review of Systems 14 system ROS conducted and negative except for that noted in HPI   OBJECTIVE  Vitals: Patient Vitals for the past 8 hrs:  BP Temp Temp src Pulse Resp SpO2  01/22/21 1400 126/81 - - 86 13 99 %  01/22/21 1300 (!) 123/58 - - 81 10 96 %  01/22/21 1200 113/61 98.3 F (36.8 C) Axillary 81 10 99 %  01/22/21 1100 (!) 118/52 - - 85 10 95 %  01/22/21 1000 138/60 - - 90 14 97 %  01/22/21 0900 - - - 90 14 97 %  01/22/21 0800 (!) 118/58 98.2 F (36.8 C) Oral 86 16 97 %  01/22/21 0700 - - - 79 12 97 %   General: Alert, no acute distress Cardiovascular: Warm extremities noted Respiratory: No cyanosis, no use of accessory musculature GI: No organomegaly, abdomen is soft and non-tender Skin: No lesions in the area of chief complaint other than those listed below in MSK exam.  Neurologic: Sensation intact distally save for the below mentioned MSK exam Psychiatric: Patient is competent for consent with normal mood and affect Lymphatic: No swelling obvious and reported other than the area involved in the exam below  Extremities  BUE: actively moves upper extremity without pain. NVI.  RLE: chronic skin changes of the right lower extremity. Mild swelling.  LLE: extensive swelling of LLE. He is able to actively dorsiflex and plantarflex his left ankle without pain. Wiggles his toes without pain. Compartments swollen but compressible. Discoloration of left toes with blistering present. Dressing in place left leg with evidence of weeping present.      Test Results Imaging CT of left tibia/fibula and foot show: "Severe cellulitis and probable diffuse myofasciitis without obvious findings for focal soft tissue abscess or pyomyositis. No CT findings for septic arthritis or osteomyelitis. Large focus of healed AVN or bone infarct involving the talus."   Xray right foot showing  nondisplaced fracture of the fifth metatarsal shaft   Labs cbc Recent Labs    01/21/21 0228 01/22/21 0216  WBC 10.2 10.2  HGB 9.6* 9.2*  HCT 30.4* 28.9*  PLT 33* 36*    Labs inflam No results for input(s): CRP in the last 72 hours.  Invalid input(s): ESR  Labs coag Recent Labs    02/01/2021 1509 01/21/21 0228  INR 2.2* 2.9*    Recent Labs    01/21/21 0228 01/22/21 0216  NA 135 134*  K 5.2* 5.2*  CL 103 103  CO2 17* 19*  GLUCOSE 117* 109*  BUN 42* 47*  CREATININE 2.31* 2.16*  CALCIUM 8.3* 8.4*     ASSESSMENT AND PLAN: 65 y.o. male with the following:   - left lower extremity cellulitis with venous and arterial insufficiency  Patient has no pain with active dorsiflexion and plantar flexion of the left ankle. Doubt compartment syndrome. Resting left ankle-brachial index indicates severe left lower extremity arterial disease. CT scan demonstrating severe cellulitis. No DVT was seen on Doppler study.  We have discussed this case with Dr. Sharol Given who will see the patient. Will defer to him for further recommendations and treatment.   - right fifth metatarsal fracture age indeterminate  - WBAT RLE, post-op shoe, boot, or thick-soled shoe such as hiking boot for comfort x 4 weeks    Noemi Chapel, PA-C 01/22/2021

## 2021-01-22 NOTE — Progress Notes (Signed)
Patient ID: Jeffrey Snow, male   DOB: 1956/06/29, 65 y.o.   MRN: [id number] I have been asked by Dr. Griffin Basil to see the patient.  In reviewing the photos radiographs and previous vascular studies patient does have venous and arterial insufficiency worse on the left leg than the right leg.  I have ordered ankle-brachial indices.  I will follow-up after studies are obtained to evaluate the patient.  Patient will also need vascular vein surgery consultation for evaluation for possible revascularization.  Anticipate patient will need transfer to [facility] to complete his vascular work-up.

## 2021-01-22 NOTE — Consult Note (Signed)
WOC Nurse Consult Note: Reason for Consult:cellulitis to left lower leg. Consult with Dr Sharol Given and awaiting ABI studies to determine best course of care.  WIll implement topical care now.  Wound type:infectious Pressure Injury POA: NA Measurement:erythema to left lower leg.  Blistering present.  2 cm x 2 cm x 01. Cm ruptured Drainage (amount, consistency, odor) minimal weeping  No odor.  Periwound:erythema and edema  Right leg with chronic skin changes noted.  Dressing procedure/placement/frequency:Cleanse left leg with NS and pat dry. Apply Xeroform to open wound. Cover with gauze and kerlix/tape.  Change daily.  Awaiting ABI.   Will not follow at this time.  Please re-consult if needed.  Domenic Moras MSN, RN, FNP-BC [facility]ON Wound, Ostomy, Continence Nurse Pager [phone]

## 2021-01-22 NOTE — CV Procedure (Signed)
ABI completed. Preliminary findings given to Bailey Mech, [place] at 1125.  Results can be found under chart review under CV PROC. 01/22/2021 11:46 AM Javona Bergevin RVT, RDMS

## 2021-01-22 NOTE — Progress Notes (Signed)
  Speech Language Pathology Treatment: Dysphagia  Patient Details Name: Jeffrey Snow MRN: [id number] DOB: 06-12-1956 Today's Date: 01/22/2021 Time: 2035-5974 SLP Time Calculation (min) (ACUTE ONLY): 15 min  Assessment / Plan / Recommendation Clinical Impression  Pt seen at bedside to assess diet tolerance and continue education. Pt accepted minimal PO trials today, due to fear of diarrhea. Intermittent cough noted after thin liquid trials. RN reports no difficulty with PO meds given with liquid.  Recommend upright position during and for 30 minutes after meals. SLP will continue to follow to assess diet tolerance and provide education.   Recommend MBS prior to DC to establish baseline of swallow function before radiation treatments begin. Also recommend referral to outpatient speech therapy upon DC, given diagnosis and location of cancer and associated high aspiration risk.    HPI HPI: 65 y.o. male with history of alcoholic cirrhosis with HCC, hepatitis C, hypertension, PAD, recently diagnosed epiglottic cancer, who presents with left leg pain after a fall.  Left lower extremity cellulitis with evidence for sepsis.  Hospitalized for further management.  Started on antibiotics. CXR 2/5 with no acute findings      SLP Plan  Continue with current plan of care       Recommendations  Diet recommendations: Other(comment) (Renal with fluid restriction) Liquids provided via: Straw;Cup Medication Administration: Whole meds with liquid Supervision: Patient able to self feed;Staff to assist with self feeding;Intermittent supervision to cue for compensatory strategies Compensations: Slow rate;Small sips/bites Postural Changes and/or Swallow Maneuvers: Seated upright 90 degrees;Upright 30-60 min after meal                Oral Care Recommendations: Oral care BID Follow up Recommendations: Outpatient SLP SLP Visit Diagnosis: Dysphagia, unspecified (R13.10) Plan: Continue with current plan  of care       [place] [name], [facility], [place] Speech Language Pathologist Office: [phone] Pager: [phone]  Shonna Chock 01/22/2021, 10:58 AM

## 2021-01-22 NOTE — TOC Initial Note (Signed)
Transition of Care [facility]) - Initial/Assessment Note    Patient Details  Name: Jeffrey Snow MRN: [id number] Date of Birth: 06-06-1956  [facility]) CM/SW Contact:    Leeroy Cha, RN Phone Number: 01/22/2021, 7:59 AM  Clinical Narrative:                 65 y.o.malewith history of alcoholic cirrhosis with HCC, hepatitis C, hypertension, PAD, recently diagnosed epiglottic cancer, who presents with left leg pain after a fall.  Left lower extremity cellulitis with evidence for sepsis.  Hospitalized for further management.  Started on antibiotics.  Reason for Visit: Left lower extremity cellulitis PLAN: to return to home with self care Expected Discharge Plan: Home/Self Care Barriers to Discharge: Continued Medical Work up   Patient Goals and CMS Choice Patient states their goals for this hospitalization and ongoing recovery are:: to go home CMS Medicare.gov Compare Post Acute Care list provided to:: Patient    Expected Discharge Plan and Services Expected Discharge Plan: Home/Self Care   Discharge Planning Services: CM Consult   Living arrangements for the past 2 months: Apartment                                      Prior Living Arrangements/Services Living arrangements for the past 2 months: Apartment Lives with:: Self Patient language and need for interpreter reviewed:: Yes Do you feel safe going back to the place where you live?: Yes      Need for Family Participation in Patient Care: No (Comment) Care giver support system in place?: No (comment)   Criminal Activity/Legal Involvement Pertinent to Current Situation/Hospitalization: No - Comment as needed  Activities of Daily Living Home Assistive Devices/Equipment: Walker (specify type) ADL Screening (condition at time of admission) Patient's cognitive ability adequate to safely complete daily activities?: Yes Is the patient deaf or have difficulty hearing?: Yes Does the patient have  difficulty seeing, even when wearing glasses/contacts?: No Does the patient have difficulty concentrating, remembering, or making decisions?: Yes Patient able to express need for assistance with ADLs?: No Does the patient have difficulty dressing or bathing?: Yes Independently performs ADLs?: Yes (appropriate for developmental age) Does the patient have difficulty walking or climbing stairs?: Yes Weakness of Legs: Both Weakness of Arms/Hands: Both  Permission Sought/Granted                  Emotional Assessment Appearance:: Appears stated age Attitude/Demeanor/Rapport: Engaged Affect (typically observed): Calm Orientation: : Oriented to Place,Oriented to Self,Oriented to  Time,Oriented to Situation Alcohol / Substance Use: Alcohol Use Psych Involvement: No (comment)  Admission diagnosis:  AKI (acute kidney injury) ([place]) [N17.9] Severe sepsis ([place]) [A41.9, R65.20] Sepsis due to cellulitis ([place]) [L03.90, A41.9] Liver failure without hepatic coma, unspecified chronicity ([place]) [K72.90] Patient Active Problem List   Diagnosis Date Noted  . Severe sepsis ([place]) January 24, 2021  . Malignant neoplasm of supraglottis ([place]) 01/19/2021  . Traumatic rhabdomyolysis ([place]) 09/19/2020  . History of GI bleed 09/19/2020  . Rhabdomyolysis 09/19/2020  . Wheezing 11/28/2019  . Neuropathy 11/28/2019  . Anxiety 11/28/2019  . Acute upper GI bleed 09/05/2015  . Lactic acidosis 09/05/2015  . Hyperkalemia 09/05/2015  . Hemorrhagic shock ([place]) 09/05/2015  . Urinary tract infection, acute 08/31/2015  . Anemia, blood loss 12/16/2014  . Melena 12/16/2014  . Anemia 12/16/2014  . Abdominal pain, chronic, right upper quadrant 07/06/2014  . HCC (hepatocellular carcinoma) (  [place]) 06/09/2014  . Other pancytopenia ([place]) 04/27/2014  . Cellulitis of leg 04/24/2014  . Elevated troponin 04/23/2014  . Normocytic anemia 04/21/2014  . Cirrhosis, alcoholic ([place]) 72/08/4708  . Hyponatremia 04/21/2014  . Hypokalemia  04/21/2014  . Metabolic acidosis 62/83/6629  . Hepatitis C 04/21/2014  . Abnormal MRI of abdomen, liver  04/21/2014  . Leg pain 04/21/2014  . Rash and nonspecific skin eruption 04/21/2014  . T wave inversion in EKG 04/21/2014  . Right foot pain 03/31/2014  . Hyperglycemia 03/03/2014  . Vitamin D deficiency disease 03/03/2014  . HTN (hypertension) 03/03/2014  . Elevated LFTs 03/03/2014  . Screening for colon cancer 03/03/2014  . Annual physical exam 03/03/2014  . PVD (peripheral vascular disease) ([place]) 12/03/2013  . PAD (peripheral artery disease) ([place]) 10/26/2013  . Arterial insufficiency ([place]) 10/26/2013  . Fall 10/26/2013  . Depression 09/22/2013  . Insomnia 09/22/2013  . Claudication of left lower extremity ([place])   . Edema 08/20/2013  . Pain in limb 08/20/2013  . Atherosclerosis of native arteries of the extremities with intermittent claudication 08/20/2013  . Thrombocytopenia ([place]) 10/12/2012   PCP:  Azzie Glatter, FNP Pharmacy:   [facility] [place] [place] [address] Phone: [phone] Fax: [phone]     Social Determinants of Health (SDOH) Interventions    Readmission Risk Interventions No flowsheet data found.

## 2021-01-22 NOTE — Progress Notes (Signed)
[facility] PROGRESS NOTE   Jeffrey Snow NWG:[id number] DOB: 1955-12-17 DOA: 01-25-21  PCP: Azzie Glatter, FNP  Brief History/Interval Summary: 65 y.o. male with history of alcoholic cirrhosis with HCC, hepatitis C, hypertension, PAD, recently diagnosed epiglottic cancer, who presents with left leg pain after a fall.  Left lower extremity cellulitis with evidence for sepsis.  Hospitalized for further management.  Started on antibiotics.  Reason for Visit: Left lower extremity cellulitis  Consultants: None yet  Procedures: None yet  Antibiotics: Anti-infectives (From admission, onward)   Start     Dose/Rate Route Frequency Ordered Stop   01/21/21 2200  ceFEPIme (MAXIPIME) 2 g in sodium chloride 0.9 % 100 mL IVPB        2 g 200 mL/hr over 30 Minutes Intravenous Every 12 hours 01/21/21 1109     01/21/21 1800  vancomycin (VANCOCIN) IVPB 1000 mg/200 mL premix        1,000 mg 200 mL/hr over 60 Minutes Intravenous Every 24 hours 2021/01/25 2055     01/21/21 1000  ceFEPIme (MAXIPIME) 2 g in sodium chloride 0.9 % 100 mL IVPB  Status:  Discontinued        2 g 200 mL/hr over 30 Minutes Intravenous Every 24 hours January 25, 2021 2050 01/21/21 1109   01/21/21 0600  ceFAZolin (ANCEF) IVPB 2g/100 mL premix  Status:  Discontinued        2 g 200 mL/hr over 30 Minutes Intravenous On call to O.R. 01/21/21 0009 01/21/21 0145   January 25, 2021 1645  vancomycin (VANCOREADY) IVPB 2000 mg/400 mL        2,000 mg 200 mL/hr over 120 Minutes Intravenous  Once Jan 25, 2021 1638 01/25/2021 1935   January 25, 2021 1615  ceFEPIme (MAXIPIME) 2 g in sodium chloride 0.9 % 100 mL IVPB        2 g 200 mL/hr over 30 Minutes Intravenous  Once 25-Jan-2021 1612 01-25-2021 1736   January 25, 2021 1615  vancomycin (VANCOCIN) IVPB 1000 mg/200 mL premix  Status:  Discontinued        1,000 mg 200 mL/hr over 60 Minutes Intravenous  Once 01-25-2021 1612 2021-01-25 1637      Subjective/Interval History: Patient is a poor historian.  Complains of pain  in his left leg.  No overnight issues reported by nursing staff.     Assessment/Plan:  Cellulitis of the left lower extremity with blistering of the left foot/septic shock/lactic acidosis/gram-negative bacteremia Patient presenting with infection involving his left leg.  Had significant elevated lactic acid level greater than 10.  No concerning findings noted on plain x-ray.  Patient was also hypotensive initially.  Patient was volume resuscitated.  Started on broad-spectrum antibiotics.  No DVT noted on Doppler studies. Patient's wound appears to be worse today compared to the day of admission.  There is more erythema over his left leg.  He also has blistering over the left foot.  It appears to be some yellowish discoloration.  They could be pus collection.  Proceed with CT scan of the left leg and the left foot.  Cannot give contrast due to his renal insufficiency.  Will involve either podiatry or orthopedics depending on the CT scan findings.  He is able to move his left leg.  Pedal pulses poorly palpable due to swelling.  Monitor for signs and symptoms of compartment syndrome. Continue vancomycin and cefepime for now.  Gram-negative rods noted in the blood.  Wait for final identification and sensitivities.  Acute kidney injury with hyperkalemia/anion gap metabolic acidosis/hyponatremia Renal  function was noted to be normal in January with a creatinine of 0.85.  Presented with BUN of 43 and creatinine of 2.86.   Patient being aggressively hydrated.  He was also given albumin.  His creatinine is better.  BUN is slightly higher.  Urine output seems to be low.  Continue to monitor.  Holding his furosemide and spironolactone.  Potassium level stable.  Sodium level noted to be slightly low today.    History of liver cirrhosis/hepatitis C/hepatocellular carcinoma/ascites Patient has required paracentesis.  Abdomen noted to be distended but nontender.  Unlikely this is the reason for his infection.  Last  paracentesis on 5/3 when 5 L of fluid were removed.  This was a therapeutic paracentesis.  Recently diagnosed epiglottic cancer Followed by Dr. Lucia Gaskins with ENT.  Not being considered for surgery.  Referred to radiation oncology.  Plan is for radiation treatments.  Also supposed to see medical oncology in the near future.  Nondisplaced fracture of the right fifth metatarsal shaft Patient does not have any symptoms from this.  Will discuss with podiatry after CT scan reports are available.  Coagulopathy INR noted to be 2.9.  This is most likely due to his liver disease.  Patient not on any anticoagulation.  3 days of vitamin K.  Will check INR afterwards.  Elevated BNP Does not appear to be volume overloaded.  Continue to follow clinically.  Macrocytic anemia and thrombocytopenia/folic acid deficiency Hemoglobin lower than his usual baseline.  Might be due to dilution.  No evidence of overt bleeding.  Hemoglobin is stable. Platelet counts low but stable.  No evidence of overt bleeding. Anemia panel shows low folic acid level.  This will be supplemented.    History of neuropathy Gabapentin continued at a lower dose.  GERD Continue PPI  History of anxiety On alprazolam.  DVT Prophylaxis: SCDs Code Status: Full code Family Communication: No family at bedside Disposition Plan: Hopefully return home when improved.  Start mobilizing in the next 24 hours.    Status is: Inpatient  Remains inpatient appropriate because:IV treatments appropriate due to intensity of illness or inability to take PO and Inpatient level of care appropriate due to severity of illness   Dispo: The patient is from: Home              Anticipated d/c is to: Home              Anticipated d/c date is: 3 days              Patient currently is not medically stable to d/c.   Difficult to place patient No     Medications:  Scheduled: . Chlorhexidine Gluconate Cloth  6 each Topical Daily  . folic acid  1 mg  Intravenous Daily  . gabapentin  300 mg Oral Daily  . pantoprazole  40 mg Oral BID AC  . phytonadione  10 mg Oral Daily  . polyethylene glycol  17 g Oral Daily  . sodium chloride flush  3 mL Intravenous Q12H   Continuous: . ceFEPime (MAXIPIME) IV Stopped (01/21/21 2200)  . dextrose 5% lactated ringers 100 mL/hr at 01/22/21 0423  . vancomycin Stopped (01/21/21 2000)   [id number], albuterol, ALPRAZolam, fentaNYL (SUBLIMAZE) injection, ondansetron **OR** ondansetron (ZOFRAN) IV   Objective:  Vital Signs  Vitals:   01/22/21 0350 01/22/21 0400 01/22/21 0500 01/22/21 0800  BP:  118/60 (!) 126/56   Pulse:  86 89   Resp:  13 16   Temp: 98.3  F (36.8 C)   98.2 F (36.8 C)  TempSrc: Axillary   Oral  SpO2:  95% 95%   Weight:      Height:        Intake/Output Summary (Last 24 hours) at 01/22/2021 0920 Last data filed at 01/22/2021 0622 Gross per 24 hour  Intake 3786.22 ml  Output 650 ml  Net 3136.22 ml   Filed Weights   02/07/2021 2034 01/21/21 0004  Weight: 86.2 kg 83.2 kg    General appearance: Awake alert.  In no distress.  Distracted Resp: Clear to auscultation bilaterally.  Normal effort Cardio: S1-S2 is normal regular.  No S3-S4.  No rubs murmurs or bruit GI: Abdomen is soft.  Nontender nondistended.  Bowel sounds are present normal.  No masses organomegaly Extremities: Erythema edema noted of the left lower extremity.  There is blistering present.  Due to extensive swelling unable to palpate pedal pulses.  Able to move his leg. Neurologic: No focal neurological deficits.     Lab Results:  Data Reviewed: I have personally reviewed following labs and imaging studies  CBC: Recent Labs  Lab 01/21/2021 1509 01/21/21 0228 01/22/21 0216  WBC 14.4* 10.2 10.2  NEUTROABS 13.0*  --   --   HGB 12.2* 9.6* 9.2*  HCT 38.4* 30.4* 28.9*  MCV 106.1* 106.3* 104.3*  PLT 48* 33* 36*    Basic Metabolic Panel: Recent Labs  Lab 01/25/2021 1509 01/21/21 0228 01/22/21 0216   NA 134* 135 134*  K 5.3* 5.2* 5.2*  CL 103 103 103  CO2 15* 17* 19*  GLUCOSE 46* 117* 109*  BUN 43* 42* 47*  CREATININE 2.86* 2.31* 2.16*  CALCIUM 8.3* 8.3* 8.4*    GFR: Estimated Creatinine Clearance: 33.6 mL/min (A) (by C-G formula based on SCr of 2.16 mg/dL (H)).  Liver Function Tests: Recent Labs  Lab 02/01/2021 1509 01/21/21 0228  AST 74* 51*  ALT 42 24  ALKPHOS 151* 84  BILITOT 3.9* 3.2*  PROT 5.0* 5.0*  ALBUMIN 2.0* 3.1*    Recent Labs  Lab 02/05/2021 1806  AMMONIA 37*    Coagulation Profile: Recent Labs  Lab 02/09/2021 1509 01/21/21 0228  INR 2.2* 2.9*     CBG: Recent Labs  Lab 01/22/2021 2355 01/21/21 0019 01/21/21 0112 01/21/21 0229 01/21/21 0350  GLUCAP 54* 100* 96 103* 106*     Recent Results (from the past 240 hour(s))  Blood culture (routine single)     Status: None (Preliminary result)   Collection Time: 02/09/2021  3:09 PM   Specimen: BLOOD  Result Value Ref Range Status   Specimen Description   Final    BLOOD LEFT WRIST Performed at [facility], [place] [address], [place], [place] [address]    Special Requests   Final    BOTTLES DRAWN AEROBIC AND ANAEROBIC Blood Culture results may not be optimal due to an inadequate volume of blood received in culture bottles Performed at [place] [address] [place], [place] [address]    Culture  Setup Time   Final    GRAM NEGATIVE RODS AEROBIC BOTTLE ONLY CRITICAL RESULT CALLED TO, READ BACK BY AND VERIFIED WITH: Coralee Rud PHARMD @1255  01/21/21 EB Performed at [facility], [place] [address], [place],  [address]    Culture GRAM NEGATIVE RODS  Final   Report Status PENDING  Incomplete  SARS Coronavirus 2 by RT PCR (hospital order, performed in [facility] hospital lab) Nasopharyngeal Nasopharyngeal Swab  Status: None   Collection Time: 01/26/2021  5:43 PM   Specimen: Nasopharyngeal Swab  Result Value Ref Range Status   SARS Coronavirus 2  NEGATIVE NEGATIVE Final    Comment: (NOTE) SARS-CoV-2 target nucleic acids are NOT DETECTED.  The SARS-CoV-2 RNA is generally detectable in upper and lower respiratory specimens during the acute phase of infection. The lowest concentration of SARS-CoV-2 viral copies this assay can detect is 250 copies / mL. A negative result does not preclude SARS-CoV-2 infection and should not be used as the sole basis for treatment or other patient management decisions.  A negative result may occur with improper specimen collection / handling, submission of specimen other than nasopharyngeal swab, presence of viral mutation(s) within the areas targeted by this assay, and inadequate number of viral copies (<250 copies / mL). A negative result must be combined with clinical observations, patient history, and epidemiological information.  Fact Sheet for Patients:   StrictlyIdeas.no  Fact Sheet for Healthcare Providers: BankingDealers.co.za  This test is not yet approved or  cleared by the [place] FDA and has been authorized for detection and/or diagnosis of SARS-CoV-2 by FDA under an Emergency Use Authorization (EUA).  This EUA will remain in effect (meaning this test can be used) for the duration of the COVID-19 declaration under Section 564(b)(1) of the Act, 21 U.S.C. section 360bbb-3(b)(1), unless the authorization is terminated or revoked sooner.  Performed at [facility], [place] [address] [place], [place] [address]   MRSA PCR Screening     Status: None   Collection Time: 02/11/2021 11:52 PM   Specimen: Nasopharyngeal  Result Value Ref Range Status   MRSA by PCR NEGATIVE NEGATIVE Final    Comment:        The GeneXpert MRSA Assay (FDA approved for NASAL specimens only), is one component of a comprehensive MRSA colonization surveillance program. It is not intended to diagnose MRSA infection nor to guide or monitor  treatment for MRSA infections. Performed at [facility], [place] [address] [place], [place] [address]       Radiology Studies: DG Tibia/Fibula Left  Result Date: 01/27/2021 CLINICAL DATA:  Fall EXAM: LEFT TIBIA AND FIBULA - 2 VIEW COMPARISON:  None. FINDINGS: No acute fracture or dislocation. Joint spaces and alignment are maintained. No area of erosion or osseous destruction. No unexpected radiopaque foreign body. Vascular calcifications. IMPRESSION: No acute fracture or dislocation. Electronically Signed   By: Valentino Saxon MD   On: 01/28/2021 17:41   DG Tibia/Fibula Right  Result Date: 01/22/2021 CLINICAL DATA:  Pain EXAM: RIGHT TIBIA AND FIBULA - 2 VIEW COMPARISON:  None. FINDINGS: Osteopenia. No acute fracture or dislocation. Joint spaces and alignment are maintained. No area of erosion or osseous destruction. No unexpected radiopaque foreign body. Vascular calcifications. IMPRESSION: No acute fracture or dislocation. Electronically Signed   By: Valentino Saxon MD   On: 01/19/2021 17:44   DG Chest Port 1 View  Result Date: 01/23/2021 CLINICAL DATA:  Fall EXAM: PORTABLE CHEST 1 VIEW COMPARISON:  November 02, 2009 FINDINGS: The cardiomediastinal silhouette is unchanged in contour. Small LEFT pleural effusion versus pleural blunting. No pneumothorax. No acute pleuroparenchymal abnormality. Visualized abdomen is unremarkable. No acute osseous abnormality. Vascular calcifications. IMPRESSION: No acute cardiopulmonary abnormality. Electronically Signed   By: Valentino Saxon MD   On: 02/09/2021 17:43   DG Foot Complete Left  Result Date: 01/26/2021 CLINICAL DATA:  Cough EXAM: LEFT FOOT - COMPLETE 3+ VIEW COMPARISON:  None. FINDINGS:  Osteopenia No acute fracture or dislocation. Joint spaces and alignment are maintained. No area of erosion or osseous destruction. No unexpected radiopaque foreign body. Vascular calcifications. IMPRESSION: No acute fracture or dislocation.  Electronically Signed   By: Valentino Saxon MD   On: 02/06/2021 17:42   DG Foot Complete Right  Result Date: 02/01/2021 CLINICAL DATA:  Pain EXAM: RIGHT FOOT COMPLETE - 3+ VIEW COMPARISON:  March 15, 2015 FINDINGS: Osteopenia. There is a nondisplaced fracture of the fifth metatarsal shaft. Is mildly comminuted appearance. No intra-articular extension. Minimal degenerative changes of the first MTP. No additional fracture noted. Vascular calcifications. IMPRESSION: Nondisplaced fracture of the fifth metatarsal shaft. Electronically Signed   By: Valentino Saxon MD   On: 02/01/2021 17:46   DG Hip Unilat With Pelvis 2-3 Views Left  Result Date: 01/27/2021 CLINICAL DATA:  Fall EXAM: DG HIP (WITH OR WITHOUT PELVIS) 2-3V LEFT COMPARISON:  October 07, 2020 FINDINGS: No acute fracture or dislocation. Chronic deformity of the RIGHT pubic ramus consistent with sequela of remote prior trauma. Degenerative changes of the lower lumbar spine. No area of erosion or osseous destruction. No unexpected radiopaque foreign body. Vascular calcifications. IMPRESSION: No acute fracture or dislocation. If persistent concern for nondisplaced hip or pelvic fracture, recommend dedicated pelvic MRI. Electronically Signed   By: Valentino Saxon MD   On: 01/18/2021 17:39   VAS [place] LOWER EXTREMITY VENOUS (DVT) (MC and WL 7a-7p)  Result Date: 01/21/2021  Lower Venous DVT Study Indications: Pain, and Swelling.  Risk Factors: Cancer newly diagnosed preepiglottic mass, recurrent multifocal hepatocellular carcinoma, and invasiveesquamous cell carcinoma. Rrecent fall. Left leg pain X 3 weeks. Ascites. Limitations: Edema, patient's pain. Comparison Study: Prior negative bilateral LEV duplex from 04/22/14, is                   available Performing Technologist: Sharion Dove RVS  Examination Guidelines: A complete evaluation includes B-mode imaging, spectral Doppler, color Doppler, and power Doppler as needed of all accessible portions of  each vessel. Bilateral testing is considered an integral part of a complete examination. Limited examinations for reoccurring indications may be performed as noted. The reflux portion of the exam is performed with the patient in reverse Trendelenburg.  +---------+---------------+---------+-----------+----------+---------------+ RIGHT    CompressibilityPhasicitySpontaneityPropertiesThrombus Aging  +---------+---------------+---------+-----------+----------+---------------+ CFV      Full           Yes      Yes                                  +---------+---------------+---------+-----------+----------+---------------+ SFJ      Full                                                         +---------+---------------+---------+-----------+----------+---------------+ FV Prox  Full                                                         +---------+---------------+---------+-----------+----------+---------------+ FV Mid   Full                                                         +---------+---------------+---------+-----------+----------+---------------+  FV DistalFull                                                         +---------+---------------+---------+-----------+----------+---------------+ PFV      Full                                                         +---------+---------------+---------+-----------+----------+---------------+ POP      Full           Yes      Yes                                  +---------+---------------+---------+-----------+----------+---------------+ PTV                                                   patent by color +---------+---------------+---------+-----------+----------+---------------+ PERO                                                  patent by color +---------+---------------+---------+-----------+----------+---------------+    +---------+---------------+---------+-----------+----------+-------------------+ LEFT     CompressibilityPhasicitySpontaneityPropertiesThrombus Aging      +---------+---------------+---------+-----------+----------+-------------------+ CFV      Full           Yes      Yes                                      +---------+---------------+---------+-----------+----------+-------------------+ SFJ      Full                                                             +---------+---------------+---------+-----------+----------+-------------------+ FV Prox                 Yes      Yes                  patent by color and                                                       Doppler             +---------+---------------+---------+-----------+----------+-------------------+ FV Mid   Full                                                             +---------+---------------+---------+-----------+----------+-------------------+  FV Distal               Yes      Yes                  patent by color and                                                       Doppler             +---------+---------------+---------+-----------+----------+-------------------+ POP                     Yes      Yes                  patent by color and                                                       Doppler             +---------+---------------+---------+-----------+----------+-------------------+ PTV                                                   patent by color and                                                       Doppler             +---------+---------------+---------+-----------+----------+-------------------+ PERO                                                  Not well visualized +---------+---------------+---------+-----------+----------+-------------------+     Summary: RIGHT: - There is no evidence of deep vein thrombosis in the lower  extremity. However, portions of this examination were limited- see technologist comments above.  LEFT: - There is no evidence of deep vein thrombosis in the lower extremity. However, portions of this examination were limited- see technologist comments above.  *See table(s) above for measurements and observations. Electronically signed by Monica Martinez MD on 01/21/2021 at 2:25:53 PM.    Final        LOS: 2 days   [facility] Pager on [email or web address]  01/22/2021, 9:20 AM

## 2021-01-22 NOTE — Progress Notes (Signed)
CRITICAL VALUE ALERT  Critical Value:  LACTIC ACID:  3.1  Date & Time Notied:  01/22/2021 @ 7628  Provider Notified: Night Provider Ouma  Orders Received/Actions taken: Awaiting orders

## 2021-01-23 ENCOUNTER — Ambulatory Visit: Payer: Medicare Other

## 2021-01-23 ENCOUNTER — Ambulatory Visit
Admission: RE | Admit: 2021-01-23 | Discharge: 2021-01-23 | Disposition: A | Discharge status: Home / Self Care | Payer: Medicare Other | Source: Ambulatory Visit | Attending: Radiation Oncology | Admitting: Radiation Oncology

## 2021-01-23 DIAGNOSIS — K7290 Hepatic failure, unspecified without coma: Secondary | ICD-10-CM | POA: Diagnosis not present

## 2021-01-23 DIAGNOSIS — I739 Peripheral vascular disease, unspecified: Secondary | ICD-10-CM | POA: Diagnosis not present

## 2021-01-23 DIAGNOSIS — C321 Malignant neoplasm of supraglottis: Secondary | ICD-10-CM

## 2021-01-23 DIAGNOSIS — L0390 Cellulitis, unspecified: Secondary | ICD-10-CM | POA: Diagnosis not present

## 2021-01-23 DIAGNOSIS — K7031 Alcoholic cirrhosis of liver with ascites: Secondary | ICD-10-CM

## 2021-01-23 DIAGNOSIS — N179 Acute kidney failure, unspecified: Secondary | ICD-10-CM | POA: Diagnosis not present

## 2021-01-23 DIAGNOSIS — I70229 Atherosclerosis of native arteries of extremities with rest pain, unspecified extremity: Secondary | ICD-10-CM

## 2021-01-23 DIAGNOSIS — L03116 Cellulitis of left lower limb: Secondary | ICD-10-CM | POA: Diagnosis not present

## 2021-01-23 DIAGNOSIS — L03119 Cellulitis of unspecified part of limb: Secondary | ICD-10-CM

## 2021-01-23 DIAGNOSIS — C220 Liver cell carcinoma: Secondary | ICD-10-CM | POA: Diagnosis not present

## 2021-01-23 DIAGNOSIS — I96 Gangrene, not elsewhere classified: Secondary | ICD-10-CM

## 2021-01-23 DIAGNOSIS — A419 Sepsis, unspecified organism: Principal | ICD-10-CM

## 2021-01-23 DIAGNOSIS — L02419 Cutaneous abscess of limb, unspecified: Secondary | ICD-10-CM

## 2021-01-23 LAB — CBC
HCT: 35.3 % — ABNORMAL LOW (ref 39.0–52.0)
Hemoglobin: 11.4 g/dL — ABNORMAL LOW (ref 13.0–17.0)
MCH: 33.5 pg (ref 26.0–34.0)
MCHC: 32.3 g/dL (ref 30.0–36.0)
MCV: 103.8 fL — ABNORMAL HIGH (ref 80.0–100.0)
Platelets: 33 10*3/uL — ABNORMAL LOW (ref 150–400)
RBC: 3.4 MIL/uL — ABNORMAL LOW (ref 4.22–5.81)
RDW: 18.6 % — ABNORMAL HIGH (ref 11.5–15.5)
WBC: 14.5 10*3/uL — ABNORMAL HIGH (ref 4.0–10.5)
nRBC: 0 % (ref 0.0–0.2)

## 2021-01-23 LAB — CULTURE, BLOOD (SINGLE)

## 2021-01-23 LAB — BASIC METABOLIC PANEL
Anion gap: 9 (ref 5–15)
BUN: 47 mg/dL — ABNORMAL HIGH (ref 8–23)
CO2: 20 mmol/L — ABNORMAL LOW (ref 22–32)
Calcium: 8.5 mg/dL — ABNORMAL LOW (ref 8.9–10.3)
Chloride: 106 mmol/L (ref 98–111)
Creatinine, Ser: 1.66 mg/dL — ABNORMAL HIGH (ref 0.61–1.24)
GFR, Estimated: 45 mL/min — ABNORMAL LOW (ref >60)
Glucose, Bld: 121 mg/dL — ABNORMAL HIGH (ref 70–99)
Potassium: 4.5 mmol/L (ref 3.5–5.1)
Sodium: 135 mmol/L (ref 135–145)

## 2021-01-23 LAB — GLUCOSE, CAPILLARY: Glucose-Capillary: 119 mg/dL — ABNORMAL HIGH (ref 70–99)

## 2021-01-23 MED ORDER — HYDROMORPHONE HCL 1 MG/ML IJ SOLN
0.5000 mg | INTRAMUSCULAR | Status: DC | PRN
  Administered 2021-01-23: 1 mg via INTRAVENOUS
  Filled 2021-01-23: qty 1

## 2021-01-23 MED ORDER — HYDROCODONE-ACETAMINOPHEN 5-325 MG PO TABS
1.0000 | ORAL_TABLET | ORAL | Status: DC | PRN
  Administered 2021-01-23 – 2021-01-26 (×4): 1 via ORAL
  Filled 2021-01-23 (×4): qty 1

## 2021-01-23 NOTE — Consult Note (Signed)
REASON FOR CONSULT:    Gangrene left foot.  The consult was requested by Dr. Maryland Pink.  ASSESSMENT & PLAN:   GANGRENE LEFT LOWER EXTREMITY: This patient has evidence of infrainguinal arterial occlusive disease on the left.  He has extensive wounds of the left foot and left leg with gangrene.  Given the extent of the wound on the left foot I do not think that the foot is salvageable even with revascularization.  He is clearly not a surgical candidate.  We could consider arteriography however, again even with successful revascularization of not sure that the limb is salvageable.  Given his markedly debilitated state with newly diagnosed cancer and significant thrombocytopenia I believe his best option would likely the primary above-the-knee amputation on the left.  He is followed by Dr. Sharol Given.   Deitra Mayo, MD Office: [phone]   HPI:   Jeffrey Snow is a pleasant 65 y.o. male, who was admitted to [facility] on 02/07/2021 with left leg pain.  He has a history of alcoholic cirrhosis and hepatitis C.  In addition he has a history of peripheral vascular disease.  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  He has significant thrombocytopenia with a platelet count of 33,000 currently.  I am unable to obtain any meaningful history from the patient.  He has apparently been sedated some.  Past Medical History:  Diagnosis Date  . Abnormal MRI of abdomen, liver  04/21/2014  . Anxiety   . Cirrhosis, alcoholic ([place]) 7/0/1779  . Claudication of left lower extremity ([place]) 10/2020  . Depression   . [place] (hepatocellular carcinoma) ([place])   . Hearing deficit   . Hepatitis C   . Hypertension   . Liver mass   . Panic attack     Family History  Problem Relation Age of Onset  . Diabetes Father   . Diabetes Brother   . Diabetes Brother     SOCIAL HISTORY: Social History   Socioeconomic History  . Marital status: Single    Spouse name: Not on file  . Number of  children: 3  . Years of education: Not on file  . Highest education level: Not on file  Occupational History    Employer: [employer]  Tobacco Use  . Smoking status: Former Smoker    Packs/day: 0.50    Years: 35.00    Pack years: 17.50    Quit date: 08/21/2011    Years since quitting: 9.4  . Smokeless tobacco: Never Used  Vaping Use  . Vaping Use: Never used  Substance and Sexual Activity  . Alcohol use: Not Currently    Alcohol/week: 0.0 standard drinks    Comment: Occasional beer on the weekends  . Drug use: No    Types: Cocaine    Comment: abused cocaine in the 80's  . Sexual activity: Not Currently  Other Topics Concern  . Not on file  Social History Narrative   Single, never married.  Lives alone.  3 daughters, 16 grandchildren who are not very involved in the patient's care.  Quit smoking and heavy drinking 3.5 years ago.    Social Determinants of Health   Financial Resource Strain: High Risk  . Difficulty of Paying Living Expenses: Hard  Food Insecurity: No Food Insecurity  . Worried About Charity fundraiser in the Last Year: Never true  . Ran Out of Food in the Last Year: Never true  Transportation Needs: No Transportation Needs  . Lack of Transportation (Medical):  No  . Lack of Transportation (Non-Medical): No  Physical Activity: Inactive  . Days of Exercise per Week: 0 days  . Minutes of Exercise per Session: 0 min  Stress: Stress Concern Present  . Feeling of Stress : Very much  Social Connections: Not on file  Intimate Partner Violence: Not on file    Allergies  Allergen Reactions  . Aspirin Other (See Comments)    GI bleeding  . Nsaids Other (See Comments)    Gi bleeding  . Folic Acid Other (See Comments)    Makes stomach feel funny  . Oxycodone Itching and Other (See Comments)    Cause insomnia    Current Facility-Administered Medications  Medication Dose Route Frequency Provider Last Rate Last Admin  . acetaminophen (TYLENOL) tablet 500 mg   500 mg Oral Q6H PRN Clarnce Flock, MD      . albuterol (VENTOLIN HFA) 108 (90 Base) MCG/ACT inhaler 2 puff  2 puff Inhalation Q6H PRN Clarnce Flock, MD      . ALPRAZolam Duanne Moron) tablet 0.5 mg  0.5 mg Oral BID PRN Bonnielee Haff, MD   0.5 mg at 01/23/21 1150  . ceFEPIme (MAXIPIME) 2 g in sodium chloride 0.9 % 100 mL IVPB  2 g Intravenous Q12H Leodis Sias T, RPH 200 mL/hr at 01/23/21 0908 2 g at 01/23/21 0908  . Chlorhexidine Gluconate Cloth 2 % PADS 6 each  6 each Topical Daily Clarnce Flock, MD   6 each at 01/22/21 [phone]  . dextrose 5 % in lactated ringers infusion   Intravenous Continuous Bonnielee Haff, MD 100 mL/hr at 01/23/21 0950 New Bag at 01/23/21 0950  . folic acid injection 1 mg  1 mg Intravenous Daily Bonnielee Haff, MD   1 mg at 01/23/21 0948  . gabapentin (NEURONTIN) capsule 300 mg  300 mg Oral Daily Clarnce Flock, MD   300 mg at 01/23/21 0848  . HYDROcodone-acetaminophen (NORCO/VICODIN) 5-325 MG per tablet 1 tablet  1 tablet Oral Q4H PRN Bonnielee Haff, MD   1 tablet at 01/23/21 1701  . HYDROmorphone (DILAUDID) injection 0.5-1 mg  0.5-1 mg Intravenous Q3H PRN Bonnielee Haff, MD   1 mg at 01/23/21 1954  . ondansetron (ZOFRAN) tablet 4 mg  4 mg Oral Q6H PRN Clarnce Flock, MD       Or  . ondansetron [facility]) injection 4 mg  4 mg Intravenous Q6H PRN Clarnce Flock, MD      . pantoprazole (PROTONIX) EC tablet 40 mg  40 mg Oral BID AC Clarnce Flock, MD   40 mg at 01/23/21 1701  . polyethylene glycol (MIRALAX / GLYCOLAX) packet 17 g  17 g Oral Daily Clarnce Flock, MD   17 g at 01/21/21 1025  . sodium chloride flush (NS) 0.9 % injection 3 mL  3 mL Intravenous Q12H Clarnce Flock, MD   3 mL at 01/23/21 0854  . vancomycin (VANCOCIN) IVPB 1000 mg/200 mL premix  1,000 mg Intravenous Q24H Efraim Kaufmann, RPH 200 mL/hr at 01/23/21 1706 1,000 mg at 01/23/21 1706    REVIEW OF SYSTEMS: Unable to obtain from the patient.  PHYSICAL EXAM:    Vitals:   01/23/21 0300 01/23/21 0400 01/23/21 0421 01/23/21 1654  BP:   123/74 (!) 141/73  Pulse:   87 (!) 101  Resp: '10 11 16 20  ' Temp:   97.9 F (36.6 C) 97.9 F (36.6 C)  TempSrc:   Oral Oral  SpO2:  100% 94%  Weight:      Height:        GENERAL: The patient is a mal-nourished male, in no acute distress. The vital signs are documented above. CARDIAC: There is a regular rate and rhythm.  VASCULAR: I do not detect carotid bruits. On the left side, which is the site of concern, he does have a femoral pulse.  I cannot palpate pedal pulses. On the right side, I cannot palpate a femoral pulse.  I cannot palpate pedal pulses. He has significant left lower extremity swelling all the way to the groin. PULMONARY: There is good air exchange bilaterally without wheezing or rales. ABDOMEN:  MUSCULOSKELETAL: There are no major deformities or cyanosis. NEUROLOGIC: No focal weakness or paresthesias are detected. SKIN: He has extensive wounds of the left leg and left foot as documented in the photographs below.  The wound on the dorsum of the foot appears to be full-thickness.      PSYCHIATRIC: The patient has a normal affect.  DATA:    ARTERIAL DOPPLER STUDY: I reviewed the arterial Doppler study that was done yesterday.  On the right side there is a monophasic dorsalis pedis and posterior tibial signal.  ABI is 44%.  On the left side, which is the side of concern, there is a monophasic dorsalis pedis and posterior tibial signal with an ABI of 51%.  VENOUS DUPLEX: I reviewed the venous duplex scan that was done on 01/19/2021.  This showed no evidence of DVT bilaterally.  LABS: I reviewed his labs.  WBC equals 14.5, hemoglobin 11.4, hematocrit 35.3, platelets 33,000.  Coags-INR equals 2.9, PTT equals 51  The met-potassium equals 4.5.  Creatinine 1.66.  GFR is 45.  X-RAY LEFT FOOT: An x-ray of the left foot does show a nondisplaced fracture of the fifth metatarsal shaft.

## 2021-01-23 NOTE — Consult Note (Signed)
ORTHOPAEDIC CONSULTATION  REQUESTING PHYSICIAN: Bonnielee Haff, MD  Chief Complaint: Painful gangrene left lower extremity.  HPI: Jeffrey Snow is a 65 y.o. male who presents with multiple medical problems including cancer ascites cirrhosis with a history of peripheral vascular disease.  Patient presents with ischemic necrotic changes to the left foot as well as ischemic changes to the entire left lower extremity.  Past Medical History:  Diagnosis Date  . Abnormal MRI of abdomen, liver  04/21/2014  . Anxiety   . Cirrhosis, alcoholic ([place]) 01/22/785  . Claudication of left lower extremity ([place]) 10/2020  . Depression   . [place] (hepatocellular carcinoma) ([place])   . Hearing deficit   . Hepatitis C   . Hypertension   . Liver mass   . Panic attack    Past Surgical History:  Procedure Laterality Date  . ABDOMINAL AORTAGRAM N/A 12/27/2013   Procedure: ABDOMINAL Maxcine Ham;  Surgeon: Conrad DeForest, MD;  Location: [facility] CATH LAB;  Service: Cardiovascular;  Laterality: N/A;  . AORTOGRAM    . COSMETIC SURGERY     facial reconstructive surgery  . DIRECT LARYNGOSCOPY N/A 12/29/2020   Procedure: DIRECT LARYNGOSCOPY WITH BIOPSY;  Surgeon: Rozetta Nunnery, MD;  Location: [facility] OR;  Service: ENT;  Laterality: N/A;  . ESOPHAGOGASTRODUODENOSCOPY N/A 12/17/2014   Procedure: ESOPHAGOGASTRODUODENOSCOPY (EGD);  Surgeon: Missy Sabins, MD;  Location: [facility];  Service: Endoscopy;  Laterality: N/A;  . ESOPHAGOGASTRODUODENOSCOPY N/A 09/05/2015   Procedure: ESOPHAGOGASTRODUODENOSCOPY (EGD);  Surgeon: Wonda Horner, MD;  Location: [facility];  Service: Endoscopy;  Laterality: N/A;  . IR ANGIOGRAM EXTREMITY RIGHT  03/21/2020  . IR ANGIOGRAM SELECTIVE EACH ADDITIONAL VESSEL  03/10/2020  . IR ANGIOGRAM SELECTIVE EACH ADDITIONAL VESSEL  03/10/2020  . IR ANGIOGRAM SELECTIVE EACH ADDITIONAL VESSEL  03/10/2020  . IR ANGIOGRAM SELECTIVE EACH ADDITIONAL VESSEL  03/10/2020  . IR ANGIOGRAM SELECTIVE EACH ADDITIONAL  VESSEL  03/10/2020  . IR ANGIOGRAM SELECTIVE EACH ADDITIONAL VESSEL  03/21/2020  . IR ANGIOGRAM SELECTIVE EACH ADDITIONAL VESSEL  03/21/2020  . IR ANGIOGRAM SELECTIVE EACH ADDITIONAL VESSEL  03/21/2020  . IR ANGIOGRAM SELECTIVE EACH ADDITIONAL VESSEL  03/21/2020  . IR ANGIOGRAM SELECTIVE EACH ADDITIONAL VESSEL  03/21/2020  . IR ANGIOGRAM SELECTIVE EACH ADDITIONAL VESSEL  04/18/2020  . IR ANGIOGRAM SELECTIVE EACH ADDITIONAL VESSEL  04/18/2020  . IR ANGIOGRAM VISCERAL SELECTIVE  03/10/2020  . IR ANGIOGRAM VISCERAL SELECTIVE  03/21/2020  . IR ANGIOGRAM VISCERAL SELECTIVE  04/18/2020  . IR EMBO ARTERIAL NOT HEMORR HEMANG INC GUIDE ROADMAPPING  03/10/2020  . IR EMBO TUMOR ORGAN ISCHEMIA INFARCT INC GUIDE ROADMAPPING  03/21/2020  . IR EMBO TUMOR ORGAN ISCHEMIA INFARCT INC GUIDE ROADMAPPING  04/18/2020  . IR GENERIC HISTORICAL  01/19/2015   IR RADIOLOGIST EVAL & MGMT 01/19/2015 Jacqulynn Cadet, MD [facility] INTERV RAD  . IR GENERIC HISTORICAL  07/23/2016   IR RADIOLOGIST EVAL & MGMT 07/23/2016 Jacqulynn Cadet, MD [facility] INTERV RAD  . IR GENERIC HISTORICAL  05/30/2015   IR RADIOLOGIST EVAL & MGMT 05/30/2015 [facility] INTERV RAD  . IR GENERIC HISTORICAL  10/04/2014   IR RADIOLOGIST EVAL & MGMT 10/04/2014 Jacqulynn Cadet, MD [facility] INTERV RAD  . IR GENERIC HISTORICAL  02/11/2017   IR RADIOLOGIST EVAL & MGMT 02/11/2017 Jacqulynn Cadet, MD [facility] INTERV RAD  . IR PARACENTESIS  10/23/2020  . IR RADIOLOGIST EVAL & MGMT  08/13/2017  . IR RADIOLOGIST EVAL & MGMT  08/11/2018  . IR RADIOLOGIST EVAL & MGMT  02/09/2019  . IR  RADIOLOGIST EVAL & MGMT  02/10/2020  . IR RADIOLOGIST EVAL & MGMT  05/11/2020  . IR RADIOLOGIST EVAL & MGMT  08/09/2020  . IR RADIOLOGIST EVAL & MGMT  12/27/2020  . IR US GUIDE VASC ACCESS LEFT  03/10/2020  . IR US GUIDE VASC ACCESS LEFT  03/21/2020  . IR US GUIDE VASC ACCESS LEFT  04/18/2020  . IR US GUIDE VASC ACCESS RIGHT  03/21/2020  . LOWER EXTREMITY ANGIOGRAM Bilateral 12/27/2013   Procedure: LOWER EXTREMITY ANGIOGRAM;   Surgeon: Conrad Wapello, MD;  Location: [facility] CATH LAB;  Service: Cardiovascular;  Laterality: Bilateral;   Social History   Socioeconomic History  . Marital status: Single    Spouse name: Not on file  . Number of children: 3  . Years of education: Not on file  . Highest education level: Not on file  Occupational History    Employer: [employer]  Tobacco Use  . Smoking status: Former Smoker    Packs/day: 0.50    Years: 35.00    Pack years: 17.50    Quit date: 08/21/2011    Years since quitting: 9.4  . Smokeless tobacco: Never Used  Vaping Use  . Vaping Use: Never used  Substance and Sexual Activity  . Alcohol use: Not Currently    Alcohol/week: 0.0 standard drinks    Comment: Occasional beer on the weekends  . Drug use: No    Types: Cocaine    Comment: abused cocaine in the 80's  . Sexual activity: Not Currently  Other Topics Concern  . Not on file  Social History Narrative   Single, never married.  Lives alone.  3 daughters, 16 grandchildren who are not very involved in the patient's care.  Quit smoking and heavy drinking 3.5 years ago.    Social Determinants of Health   Financial Resource Strain: High Risk  . Difficulty of Paying Living Expenses: Hard  Food Insecurity: No Food Insecurity  . Worried About Charity fundraiser in the Last Year: Never true  . Ran Out of Food in the Last Year: Never true  Transportation Needs: No Transportation Needs  . Lack of Transportation (Medical): No  . Lack of Transportation (Non-Medical): No  Physical Activity: Inactive  . Days of Exercise per Week: 0 days  . Minutes of Exercise per Session: 0 min  Stress: Stress Concern Present  . Feeling of Stress : Very much  Social Connections: Not on file   Family History  Problem Relation Age of Onset  . Diabetes Father   . Diabetes Brother   . Diabetes Brother    - negative except otherwise stated in the family history section Allergies  Allergen Reactions  . Aspirin Other (See  Comments)    GI bleeding  . Nsaids Other (See Comments)    Gi bleeding  . Folic Acid Other (See Comments)    Makes stomach feel funny  . Oxycodone Itching and Other (See Comments)    Cause insomnia   Prior to Admission medications   Medication Sig Start Date End Date Taking? Authorizing Provider  albuterol (VENTOLIN HFA) 108 (90 Base) MCG/ACT inhaler Inhale 2 puffs into the lungs every 6 (six) hours as needed for wheezing or shortness of breath. 07/04/20  Yes Azzie Glatter, FNP  ALPRAZolam Duanne Moron) 1 MG tablet Take 1 mg by mouth 2 (two) times daily.   Yes [provider]  bismuth subsalicylate (PEPTO BISMOL) 262 MG/15ML suspension Take 30 mLs by mouth every 6 (six) hours  as needed for diarrhea or loose stools.   Yes [provider]  Dextromethorphan-guaiFENesin (ROBITUSSIN COUGH+CHEST CONG DM) 20-200 MG/20ML LIQD Take 10 mLs by mouth every 4 (four) hours as needed (cough).   Yes [provider]  furosemide (LASIX) 20 MG tablet Take 1 tablet (20 mg total) by mouth daily. 11/22/20  Yes Azzie Glatter, FNP  gabapentin (NEURONTIN) 300 MG capsule Take 1 capsule (300 mg total) by mouth 3 (three) times daily. 06/06/20  Yes Azzie Glatter, FNP  pantoprazole (PROTONIX) 40 MG tablet TAKE 1 TABLET(40 MG) BY MOUTH TWICE DAILY BEFORE A MEAL Patient taking differently: Take 40 mg by mouth 2 (two) times daily. 11/22/20  Yes Azzie Glatter, FNP  potassium chloride SA (KLOR-CON) 20 MEQ tablet Take 1 tablet (20 mEq total) by mouth daily. 12/22/20  Yes Truitt Merle, MD  spironolactone (ALDACTONE) 25 MG tablet Take 3 tablets (75 mg total) by mouth daily. 10/07/20  Yes Nuala Alpha A, PA-C  loperamide (IMODIUM) 2 MG capsule Take 1-2 capsules (2-4 mg total) by mouth as needed for diarrhea or loose stools. Patient not taking: No sig reported 11/30/20   Alla Feeling, NP  ondansetron (ZOFRAN) 8 MG tablet Take 1 tablet (8 mg total) by mouth every 8 (eight) hours as needed for nausea  or vomiting. Patient not taking: Reported on 01/31/2021 11/30/20   Alla Feeling, NP   CT TIBIA FIBULA LEFT WO CONTRAST  Result Date: 01/22/2021 CLINICAL DATA:  Left lower extremity pain and swelling. EXAM: CT OF THE LEFT FOOT WITHOUT CONTRAST TECHNIQUE: Multidetector CT imaging of the left foot was performed according to the standard protocol. Multiplanar CT image reconstructions were also generated. COMPARISON:  Radiographs 01/22/2021 FINDINGS: Severe skin thickening, subcutaneous soft tissue swelling/edema/fluid and scattered skin blisters all suggesting severe cellulitis. I do not see a definite focal drainable fluid collection to suggest an abscess examination is limited without contrast. Suspect diffuse myofasciitis also without obvious findings pyomyositis. No findings suspicious for septic arthritis or osteomyelitis. There is a large focus of healed AVN or bone infarct involving the talus. IMPRESSION: 1. Severe cellulitis and probable diffuse myofasciitis without obvious findings for focal soft tissue abscess or pyomyositis. 2. No CT findings for septic arthritis or osteomyelitis. 3. If symptoms persist or worsen may be further evaluation. 4. Large focus of healed AVN or bone infarct involving the talus. Electronically Signed   By: Marijo Sanes M.D.   On: 01/22/2021 13:15   CT FOOT LEFT WO CONTRAST  Result Date: 01/22/2021 CLINICAL DATA:  Left lower extremity pain and swelling. EXAM: CT OF THE LEFT FOOT WITHOUT CONTRAST TECHNIQUE: Multidetector CT imaging of the left foot was performed according to the standard protocol. Multiplanar CT image reconstructions were also generated. COMPARISON:  Radiographs 01/22/2021 FINDINGS: Severe skin thickening, subcutaneous soft tissue swelling/edema/fluid and scattered skin blisters all suggesting severe cellulitis. I do not see a definite focal drainable fluid collection to suggest an abscess examination is limited without contrast. Suspect diffuse myofasciitis  also without obvious findings pyomyositis. No findings suspicious for septic arthritis or osteomyelitis. There is a large focus of healed AVN or bone infarct involving the talus. IMPRESSION: 1. Severe cellulitis and probable diffuse myofasciitis without obvious findings for focal soft tissue abscess or pyomyositis. 2. No CT findings for septic arthritis or osteomyelitis. 3. If symptoms persist or worsen may be further evaluation. 4. Large focus of healed AVN or bone infarct involving the talus. Electronically Signed   By: Marijo Sanes  M.D.   On: 01/22/2021 13:15   VAS [place] ABI WITH/WO TBI  Result Date: 01/22/2021 LOWER EXTREMITY DOPPLER STUDY Indications: Rest pain, gangrene, and LLE cellulitis post injury/fall. High Risk Factors: Hypertension, past history of smoking. Other Factors: CA, Cirrhosis, hepatitis C, PAD.  Comparison Study: Previous study performed 09/03/2013 Performing Technologist: Rogelia Rohrer RVT, RDMS  Examination Guidelines: A complete evaluation includes at minimum, Doppler waveform signals and systolic blood pressure reading at the level of bilateral brachial, anterior tibial, and posterior tibial arteries, when vessel segments are accessible. Bilateral testing is considered an integral part of a complete examination. Photoelectric Plethysmograph (PPG) waveforms and toe systolic pressure readings are included as required and additional duplex testing as needed. Limited examinations for reoccurring indications may be performed as noted.  ABI Findings: +--------+------------------+-----+----------+--------+ Right   Rt Pressure (mmHg)IndexWaveform  Comment  +--------+------------------+-----+----------+--------+ [id number]                    triphasic          +--------+------------------+-----+----------+--------+ PTA     52                0.39 monophasic         +--------+------------------+-----+----------+--------+ DP      59                0.44 monophasic          +--------+------------------+-----+----------+--------+ +--------+------------------+-----+----------+-------+ Left    Lt Pressure (mmHg)IndexWaveform  Comment +--------+------------------+-----+----------+-------+ [id number]                    triphasic         +--------+------------------+-----+----------+-------+ PTA     65                0.49 monophasic        +--------+------------------+-----+----------+-------+ DP      68                0.51 monophasic        +--------+------------------+-----+----------+-------+ +-------+-----------+-----------+------------+------------+ ABI/TBIToday's ABIToday's TBIPrevious ABIPrevious TBI +-------+-----------+-----------+------------+------------+ Right  0.44                  0.70                     +-------+-----------+-----------+------------+------------+ Left   0.51                  0.69                     +-------+-----------+-----------+------------+------------+  Summary: Right: Resting right ankle-brachial index indicates severe right lower extremity arterial disease. Left: Resting left ankle-brachial index indicates severe left lower extremity arterial disease. ABI of LLE likely elevated due to infection, possibly underestimating level of disease.  *See table(s) above for measurements and observations.  Electronically signed by Jamelle Haring on 01/22/2021 at 5:02:36 PM.    Final    - pertinent xrays, CT, MRI studies were reviewed and independently interpreted  Positive ROS: All other systems have been reviewed and were otherwise negative with the exception of those mentioned in the HPI and as above.  Physical Exam: General: Alert, no acute distress Psychiatric: Patient is competent for consent with normal mood and affect Lymphatic: No axillary or cervical lymphadenopathy Cardiovascular: No pedal edema Respiratory: No cyanosis, no use of accessory musculature GI: No organomegaly, abdomen is soft and  non-tender    Images:  @ENCIMAGES @  Labs:  Lab Results  Component Value  Date   HGBA1C 5.8 (A) 11/26/2019   HGBA1C 5.3 01/06/2019   HGBA1C 5.5 06/17/2018   ESRSEDRATE 4 04/21/2014   ESRSEDRATE 4 03/03/2014   REPTSTATUS 01/23/2021 FINAL 01/28/2021   CULT AEROMONAS HYDROPHILA GROUP (A) 01/23/2021   LABORGA AEROMONAS HYDROPHILA GROUP 01/28/2021    Lab Results  Component Value Date   ALBUMIN 3.1 (L) 01/21/2021   ALBUMIN 2.0 (L) 02/11/2021   ALBUMIN 2.1 (L) 12/29/2020    Neurologic: Patient does not have protective sensation bilateral lower extremities.   MUSCULOSKELETAL:   Skin: Examination patient has tense ascites of the abdomen.  I cannot palpate a femoral pulse.  He has pain to palpation and swelling of the entire left lower extremity with cellulitis thigh and calf tenderness to palpation.  He has ischemic gangrenous changes to the left foot and left lower extremity without palpable pulses.  Ankle-brachial indices in 2016 showed severe peripheral vascular disease repeat ankle-brachial indices obtained showed worsening of this peripheral vascular disease with ABI of 2.44 on the right 0.5 more than the left with dampened monophasic flow.  Also has venous stasis swelling and lymphatic swelling of the left lower extremity worse than the right.  Ultrasound was negative for DVT.  I cannot palpate pulses in the entire left lower extremity.  Assessment: Assessment: Critical limb ischemia of the left lower extremity with wet gangrenous changes of the left  foot.  Plan: Will need patient transferred  to [facility] for vascular surgery evaluation for possible limb salvage intervention.  I will follow-up as needed.  Thank you for the consult and the opportunity to see Mr. Emilo Gras, [place] [phone] 1:10 PM

## 2021-01-23 NOTE — Progress Notes (Signed)
Oncology Nurse Navigator Documentation  To provide support, encouragement and care continuity, met with Mr. Whiters during his CT SIM.  He tolerated procedure without difficulty, denied questions/concerns.    I encouraged him to call me prior to his scheduled start date for radiation.   Harlow Asa RN, BSN, OCN Head & Neck Oncology Nurse [place] at [facility] Phone # [phone]  Fax # [phone]

## 2021-01-23 NOTE — Progress Notes (Signed)
Oncology Nurse Navigator Documentation  Jeffrey Snow is scheduled for CT simulation today and is currently an inpatient at [facility] in room 1404. I called and spoke to his nurse Myriam Jacobson and confirmed that Mr. Petitjean will be able to come for his appointment at 1:00 pm in the Radiation Oncology department. CT simulation will notify transportation to pick up Mr. Osorto between 12:30 and 12:45.   Harlow Asa RN, BSN, OCN Head & Neck Oncology Nurse [place] at [facility] Phone # [phone]  Fax # [phone]

## 2021-01-23 NOTE — Progress Notes (Signed)
  Speech Language Pathology Treatment: Dysphagia  Patient Details Name: Jeffrey Snow MRN: [id number] DOB: Aug 02, 1956 Today's Date: 01/23/2021 Time: 1443-1540 SLP Time Calculation (min) (ACUTE ONLY): 25 min  Assessment / Plan / Recommendation Clinical Impression  Pt seen to assess po tolerance, to educate him to importance of oral care and measure for trismus apparatus.  Per RN, pt consumed a few bites of food only and declined more.  Advised him to importance of intake due to current illness and pt repeatedly stated "I don't like roast beef" - Recommend to allow him to order SOFT FOODS from his RENAL [facility].    Note pt is very xerostomic and SLP measured maximum mandibular ROM - to 45 mm - therefore will create apparatus to 47 mm and assess for modifications needed.  Small orifice and dry lips impaired ability to measure ROM adequately.  Pt observed consuming tea and water - subtle throat clearing and delayed cough noted. Pt reports coughing at home with foods more than liquids prior to admission.  Oral cavity is xerostomic with dried secretions - and pt requiring moderate assistance to recognize need for oral care.  Lingual and gum brushing needed for pulmonary health  - especially as pt will be starting XRT for cancer. Provided pt with information in writing re: dietary recommendations and importance of oral care.    Also provided information re: pocket talker amplifiers due to pt's level of hearing loss.  Pt's pain is limiting his participation in therapy today.    Will follow up for dysphagia treatment, management.  Concern for adequacy of nutrition noted - however pt had ascitites and PEG may be contraindicated.   Recommend continue diet and conduct MBS prior to dc hospital to provide baseline swallow function given newly diagnosed epiglottic cancer.   HPI HPI: 65 y.o. male with history of alcoholic cirrhosis with HCC, hepatitis C, hypertension, PAD, recently diagnosed epiglottic  cancer, who presents with left leg pain after a fall.  Left lower extremity cellulitis with evidence for sepsis.  Hospitalized for further management.  Started on antibiotics. CXR 2/5 with no acute findings      SLP Plan  Continue with current plan of care       Recommendations  Diet recommendations: Dysphagia 3 (mechanical soft);Thin liquid Liquids provided via: Straw;Cup Medication Administration: Whole meds with liquid Supervision: Patient able to self feed;Staff to assist with self feeding;Intermittent supervision to cue for compensatory strategies Compensations: Slow rate;Small sips/bites Postural Changes and/or Swallow Maneuvers: Seated upright 90 degrees;Upright 30-60 min after meal                Oral Care Recommendations: Oral care BID Follow up Recommendations: Outpatient SLP SLP Visit Diagnosis: Dysphagia, unspecified (R13.10) Plan: Continue with current plan of care       GO                Macario Golds 01/23/2021, 5:00 PM Kathleen Lime, MS [place] Office [phone] Pager [phone]

## 2021-01-23 NOTE — Care Management Important Message (Signed)
Important Message  Patient Details IM Letter given to the Patient.  Name: Jeffrey Snow MRN: [id number] Date of Birth: [birth year]   Medicare Important Message Given:  Yes     Kerin Salen 01/23/2021, 10:35 AM

## 2021-01-23 NOTE — Progress Notes (Addendum)
[facility] PROGRESS NOTE   ATILANO COVELLI JQG:[id number] DOB: 28-Jul-1956 DOA: 01/18/2021  PCP: Azzie Glatter, FNP  Brief History/Interval Summary: 65 y.o. male with history of alcoholic cirrhosis with HCC, hepatitis C, hypertension, PAD, recently diagnosed epiglottic cancer, who presents with left leg pain after a fall.  Left lower extremity cellulitis with evidence for sepsis.  Hospitalized for further management.  Started on antibiotics.  Patient had worsening swelling of his leg.  Orthopedics was consulted.  Concern for vascular insufficiency as well.  Reason for Visit: Left lower extremity cellulitis  Consultants: Dr. Sharol Given with orthopedics.  Vascular surgery will also be consulted, discussed with Dr. Scot Dock.  Procedures: None yet  Antibiotics: Anti-infectives (From admission, onward)   Start     Dose/Rate Route Frequency Ordered Stop   01/21/21 2200  ceFEPIme (MAXIPIME) 2 g in sodium chloride 0.9 % 100 mL IVPB        2 g 200 mL/hr over 30 Minutes Intravenous Every 12 hours 01/21/21 1109     01/21/21 1800  vancomycin (VANCOCIN) IVPB 1000 mg/200 mL premix        1,000 mg 200 mL/hr over 60 Minutes Intravenous Every 24 hours 02/12/2021 2055     01/21/21 1000  ceFEPIme (MAXIPIME) 2 g in sodium chloride 0.9 % 100 mL IVPB  Status:  Discontinued        2 g 200 mL/hr over 30 Minutes Intravenous Every 24 hours 01/18/2021 2050 01/21/21 1109   01/21/21 0600  ceFAZolin (ANCEF) IVPB 2g/100 mL premix  Status:  Discontinued        2 g 200 mL/hr over 30 Minutes Intravenous On call to O.R. 01/21/21 0009 01/21/21 0145   01/18/2021 1645  vancomycin (VANCOREADY) IVPB 2000 mg/400 mL        2,000 mg 200 mL/hr over 120 Minutes Intravenous  Once 01/27/2021 1638 02/02/2021 1935   01/31/2021 1615  ceFEPIme (MAXIPIME) 2 g in sodium chloride 0.9 % 100 mL IVPB        2 g 200 mL/hr over 30 Minutes Intravenous  Once 01/19/2021 1612 02/11/2021 1736   02/11/2021 1615  vancomycin (VANCOCIN) IVPB 1000 mg/200 mL  premix  Status:  Discontinued        1,000 mg 200 mL/hr over 60 Minutes Intravenous  Once 01/30/2021 1612 02/12/2021 1637      Subjective/Interval History: Patient remains a poor historian.  Continues to complain of pain in the left leg..     Assessment/Plan:  Cellulitis of the left lower extremity with blistering of the left foot/septic shock/lactic acidosis/gram-negative bacteremia Patient presenting with infection involving his left leg.  Had significant elevated lactic acid level greater than 10.  No concerning findings noted on plain x-ray.  Patient was also hypotensive initially.  Patient was volume resuscitated.  Started on broad-spectrum antibiotics.  No DVT noted on Doppler studies. Patient noted to have worsening swelling erythema of his left leg and foot.  Peripheral pulses were not palpable due to extensive edema.  Due to worsening nature of his infection orthopedics was consulted.  ABIs were ordered by them.  He has evidence for arterial insufficiency.  There is concern that there might be some ischemia.  Discussed with Dr. Sharol Given today who recommends transfer to [facility].  Will involve vascular surgery as well.  Continue broad-spectrum antibiotics with vancomycin and cefepime for now. Blood culture growing gram-negative rods which has been identified as Aeromonas hydrophila group. Might benefit from involving infectious disease but this can wait until  patient is transferred over to [facility].  Peripheral artery disease/vascular insufficiency of the left lower extremity Patient underwent arterial Dopplers with ABIs which shows significantly diminished ABIs in both the lower extremities.  Patient previously seen by vascular surgery a few years ago.  Was supposed to undergo an arteriogram but does not look like it was ever done.  Discussed with Dr. Sharol Given.  Will involve vascular surgery, Dr. Scot Dock notified.  Patient to go to [facility].  Will likely need angiogram.  Due to severe  thrombocytopenia cannot use heparin at this time.  Acute kidney injury with hyperkalemia/anion gap metabolic acidosis/hyponatremia Renal function was noted to be normal in January with a creatinine of 0.85.  Presented with BUN of 43 and creatinine of 2.86.   Patient being aggressively hydrated.  He was also given albumin.   Renal function slowly improving.  Creatinine is down to 1.66.  Sodium level is normal today.  Potassium is normal today.    History of liver cirrhosis/hepatitis C/hepatocellular carcinoma/ascites Patient has required paracentesis.  Abdomen noted to be distended but nontender. Last paracentesis on 01/18/21 when 5 L of fluid were removed.  This was a therapeutic paracentesis.  Recently diagnosed epiglottic cancer Followed by Dr. Lucia Gaskins with ENT.  Not being considered for surgery.  Referred to radiation oncology.  Plan is for radiation treatments.  Also supposed to see medical oncology in the near future.  Nondisplaced fracture of the right fifth metatarsal shaft Patient does not have any symptoms from this.  Orthopedics recommended weight-bear as tolerated, postop shoe, boot or thick soled shoes such as hiking boot for comfort for 4 weeks.    Coagulopathy INR noted to be 2.9.  This is most likely due to his liver disease.  Patient not on any anticoagulation.  3 days of vitamin K.  Recheck INR tomorrow.  Macrocytic anemia and thrombocytopenia/folic acid deficiency Hemoglobin lower than his usual baseline.  Might be due to dilution.  Hemoglobin stable.  No evidence for bleeding. Platelet counts low but stable.  No evidence of overt bleeding. Anemia panel shows low folic acid level.  This is being supplemented.    History of neuropathy Gabapentin continued at a lower dose.  GERD Continue PPI  History of anxiety On alprazolam as needed at a lower dose.  Goals of care Patient with numerous other health issues with guarded to poor long-term prognosis.  Will involve  palliative care to assist with goals of care.  DVT Prophylaxis: SCDs Code Status: Full code Family Communication: No family at bedside.  Daughter was updated over phone.  She plans to visit tomorrow.  She lives in [place]. No other family available locally. Disposition Plan: Hopefully return home when improved.  Current plan is for transfer to [facility].  Status is: Inpatient  Remains inpatient appropriate because:IV treatments appropriate due to intensity of illness or inability to take PO and Inpatient level of care appropriate due to severity of illness   Dispo: The patient is from: Home              Anticipated d/c is to: Home              Anticipated d/c date is: 3 days              Patient currently is not medically stable to d/c.   Difficult to place patient No     Medications:  Scheduled: . Chlorhexidine Gluconate Cloth  6 each Topical Daily  . folic acid  1 mg Intravenous Daily  . gabapentin  300 mg Oral Daily  . pantoprazole  40 mg Oral BID AC  . polyethylene glycol  17 g Oral Daily  . sodium chloride flush  3 mL Intravenous Q12H   Continuous: . ceFEPime (MAXIPIME) IV 2 g (01/23/21 0908)  . dextrose 5% lactated ringers 100 mL/hr at 01/23/21 0950  . vancomycin 1,000 mg (01/22/21 1836)   JME:[id number], albuterol, ALPRAZolam, fentaNYL (SUBLIMAZE) injection, ondansetron **OR** ondansetron (ZOFRAN) IV   Objective:  Vital Signs  Vitals:   01/23/21 0200 01/23/21 0300 01/23/21 0400 01/23/21 0421  BP:    123/74  Pulse:    87  Resp: 18 10 11 16   Temp:    97.9 F (36.6 C)  TempSrc:    Oral  SpO2:    100%  Weight:      Height:        Intake/Output Summary (Last 24 hours) at 01/23/2021 1106 Last data filed at 01/23/2021 1000 Gross per 24 hour  Intake 2265.32 ml  Output --  Net 2265.32 ml   Filed Weights   02/11/2021 2034 01/21/21 0004  Weight: 86.2 kg 83.2 kg    General appearance: Awake alert.  In no distress Resp: Clear to auscultation  bilaterally.  Normal effort Cardio: S1-S2 is normal regular.  No S3-S4.  No rubs murmurs or bruit GI: Abdomen is soft.  Distention noted.  Nontender.  No masses organomegaly.  Bowel sounds present.   Extremities: Significant swelling and erythema over the left lower leg.  The left foot appears to be dusky.  Poorly palpable pulses due to swelling.  He is able to lift his left leg. Neurologic:  No focal neurological deficits.     Lab Results:  Data Reviewed: I have personally reviewed following labs and imaging studies  CBC: Recent Labs  Lab 01/28/2021 1509 01/21/21 0228 01/22/21 0216 01/23/21 0533  WBC 14.4* 10.2 10.2 14.5*  NEUTROABS 13.0*  --   --   --   HGB 12.2* 9.6* 9.2* 11.4*  HCT 38.4* 30.4* 28.9* 35.3*  MCV 106.1* 106.3* 104.3* 103.8*  PLT 48* 33* 36* 33*    Basic Metabolic Panel: Recent Labs  Lab 02/04/2021 1509 01/21/21 0228 01/22/21 0216 01/23/21 0533  NA 134* 135 134* 135  K 5.3* 5.2* 5.2* 4.5  CL 103 103 103 106  CO2 15* 17* 19* 20*  GLUCOSE 46* 117* 109* 121*  BUN 43* 42* 47* 47*  CREATININE 2.86* 2.31* 2.16* 1.66*  CALCIUM 8.3* 8.3* 8.4* 8.5*    GFR: Estimated Creatinine Clearance: 43.7 mL/min (A) (by C-G formula based on SCr of 1.66 mg/dL (H)).  Liver Function Tests: Recent Labs  Lab 01/18/2021 1509 01/21/21 0228  AST 74* 51*  ALT 42 24  ALKPHOS 151* 84  BILITOT 3.9* 3.2*  PROT 5.0* 5.0*  ALBUMIN 2.0* 3.1*    Recent Labs  Lab 01/16/2021 1806  AMMONIA 37*    Coagulation Profile: Recent Labs  Lab 01/31/2021 1509 01/21/21 0228  INR 2.2* 2.9*     CBG: Recent Labs  Lab 02/04/2021 2355 01/21/21 0019 01/21/21 0112 01/21/21 0229 01/21/21 0350  GLUCAP 54* 100* 96 103* 106*     Recent Results (from the past 240 hour(s))  Blood culture (routine single)     Status: Abnormal   Collection Time: 01/17/2021  3:09 PM   Specimen: BLOOD  Result Value Ref Range Status   Specimen Description   Final    BLOOD LEFT WRIST Performed at [facility]  [facility], [place] [address] [place], [place] [address]    Special Requests   Final    BOTTLES DRAWN AEROBIC AND ANAEROBIC Blood Culture results may not be optimal due to an inadequate volume of blood received in culture bottles Performed at [place] [address], [place], [place] [address]    Culture  Setup Time   Final    GRAM NEGATIVE RODS AEROBIC BOTTLE ONLY CRITICAL RESULT CALLED TO, READ BACK BY AND VERIFIED WITH: Coralee Rud PHARMD @1255  01/21/21 EB Performed at [facility], [place] [address], [place], [place] [address]    Culture AEROMONAS HYDROPHILA GROUP (A)  Final   Report Status 01/23/2021 FINAL  Final   Organism ID, Bacteria AEROMONAS HYDROPHILA GROUP  Final      Susceptibility   Aeromonas hydrophila group - MIC*    CEFAZOLIN 32 INTERMEDIATE Intermediate     CEFTAZIDIME <=1 SENSITIVE Sensitive     CIPROFLOXACIN <=0.25 SENSITIVE Sensitive     GENTAMICIN <=1 SENSITIVE Sensitive     IMIPENEM >=16 RESISTANT Resistant     TRIMETH/SULFA <=20 SENSITIVE Sensitive     * AEROMONAS HYDROPHILA GROUP  SARS Coronavirus 2 by RT PCR (hospital order, performed in [place] hospital lab) Nasopharyngeal Nasopharyngeal Swab     Status: None   Collection Time: 01/30/2021  5:43 PM   Specimen: Nasopharyngeal Swab  Result Value Ref Range Status   SARS Coronavirus 2 NEGATIVE NEGATIVE Final    Comment: (NOTE) SARS-CoV-2 target nucleic acids are NOT DETECTED.  The SARS-CoV-2 RNA is generally detectable in upper and lower respiratory specimens during the acute phase of infection. The lowest concentration of SARS-CoV-2 viral copies this assay can detect is 250 copies / mL. A negative result does not preclude SARS-CoV-2 infection and should not be used as the sole basis for treatment or other patient management decisions.  A negative result may occur with improper specimen collection / handling, submission of specimen other than nasopharyngeal swab, presence  of viral mutation(s) within the areas targeted by this assay, and inadequate number of viral copies (<250 copies / mL). A negative result must be combined with clinical observations, patient history, and epidemiological information.  Fact Sheet for Patients:   StrictlyIdeas.no  Fact Sheet for Healthcare Providers: BankingDealers.co.za  This test is not yet approved or  cleared by the [place] FDA and has been authorized for detection and/or diagnosis of SARS-CoV-2 by FDA under an Emergency Use Authorization (EUA).  This EUA will remain in effect (meaning this test can be used) for the duration of the COVID-19 declaration under Section 564(b)(1) of the Act, 21 U.S.C. section 360bbb-3(b)(1), unless the authorization is terminated or revoked sooner.  Performed at [facility], [place] [address] [place], [place] [address]   MRSA PCR Screening     Status: None   Collection Time: 01/22/2021 11:52 PM   Specimen: Nasopharyngeal  Result Value Ref Range Status   MRSA by PCR NEGATIVE NEGATIVE Final    Comment:        The GeneXpert MRSA Assay (FDA approved for NASAL specimens only), is one component of a comprehensive MRSA colonization surveillance program. It is not intended to diagnose MRSA infection nor to guide or monitor treatment for MRSA infections. Performed at [facility], [place] [address] [place], [place] [address]       Radiology Studies: CT TIBIA FIBULA LEFT WO CONTRAST  Result Date: 01/22/2021 CLINICAL DATA:  Left lower extremity pain and  swelling. EXAM: CT OF THE LEFT FOOT WITHOUT CONTRAST TECHNIQUE: Multidetector CT imaging of the left foot was performed according to the standard protocol. Multiplanar CT image reconstructions were also generated. COMPARISON:  Radiographs 01/22/2021 FINDINGS: Severe skin thickening, subcutaneous soft tissue swelling/edema/fluid and scattered skin  blisters all suggesting severe cellulitis. I do not see a definite focal drainable fluid collection to suggest an abscess examination is limited without contrast. Suspect diffuse myofasciitis also without obvious findings pyomyositis. No findings suspicious for septic arthritis or osteomyelitis. There is a large focus of healed AVN or bone infarct involving the talus. IMPRESSION: 1. Severe cellulitis and probable diffuse myofasciitis without obvious findings for focal soft tissue abscess or pyomyositis. 2. No CT findings for septic arthritis or osteomyelitis. 3. If symptoms persist or worsen may be further evaluation. 4. Large focus of healed AVN or bone infarct involving the talus. Electronically Signed   By: Marijo Sanes M.D.   On: 01/22/2021 13:15   CT FOOT LEFT WO CONTRAST  Result Date: 01/22/2021 CLINICAL DATA:  Left lower extremity pain and swelling. EXAM: CT OF THE LEFT FOOT WITHOUT CONTRAST TECHNIQUE: Multidetector CT imaging of the left foot was performed according to the standard protocol. Multiplanar CT image reconstructions were also generated. COMPARISON:  Radiographs 01/22/2021 FINDINGS: Severe skin thickening, subcutaneous soft tissue swelling/edema/fluid and scattered skin blisters all suggesting severe cellulitis. I do not see a definite focal drainable fluid collection to suggest an abscess examination is limited without contrast. Suspect diffuse myofasciitis also without obvious findings pyomyositis. No findings suspicious for septic arthritis or osteomyelitis. There is a large focus of healed AVN or bone infarct involving the talus. IMPRESSION: 1. Severe cellulitis and probable diffuse myofasciitis without obvious findings for focal soft tissue abscess or pyomyositis. 2. No CT findings for septic arthritis or osteomyelitis. 3. If symptoms persist or worsen may be further evaluation. 4. Large focus of healed AVN or bone infarct involving the talus. Electronically Signed   By: Marijo Sanes  M.D.   On: 01/22/2021 13:15   VAS [place] ABI WITH/WO TBI  Result Date: 01/22/2021 LOWER EXTREMITY DOPPLER STUDY Indications: Rest pain, gangrene, and LLE cellulitis post injury/fall. High Risk Factors: Hypertension, past history of smoking. Other Factors: CA, Cirrhosis, hepatitis C, PAD.  Comparison Study: Previous study performed 09/03/2013 Performing Technologist: Rogelia Rohrer RVT, RDMS  Examination Guidelines: A complete evaluation includes at minimum, Doppler waveform signals and systolic blood pressure reading at the level of bilateral brachial, anterior tibial, and posterior tibial arteries, when vessel segments are accessible. Bilateral testing is considered an integral part of a complete examination. Photoelectric Plethysmograph (PPG) waveforms and toe systolic pressure readings are included as required and additional duplex testing as needed. Limited examinations for reoccurring indications may be performed as noted.  ABI Findings: +--------+------------------+-----+----------+--------+ Right   Rt Pressure (mmHg)IndexWaveform  Comment  +--------+------------------+-----+----------+--------+ [id number]                    triphasic          +--------+------------------+-----+----------+--------+ PTA     52                0.39 monophasic         +--------+------------------+-----+----------+--------+ DP      59                0.44 monophasic         +--------+------------------+-----+----------+--------+ +--------+------------------+-----+----------+-------+ Left    Lt Pressure (mmHg)IndexWaveform  Comment +--------+------------------+-----+----------+-------+ [id number]  triphasic         +--------+------------------+-----+----------+-------+ PTA     65                0.49 monophasic        +--------+------------------+-----+----------+-------+ DP      68                0.51 monophasic        +--------+------------------+-----+----------+-------+  +-------+-----------+-----------+------------+------------+ ABI/TBIToday's ABIToday's TBIPrevious ABIPrevious TBI +-------+-----------+-----------+------------+------------+ Right  0.44                  0.70                     +-------+-----------+-----------+------------+------------+ Left   0.51                  0.69                     +-------+-----------+-----------+------------+------------+  Summary: Right: Resting right ankle-brachial index indicates severe right lower extremity arterial disease. Left: Resting left ankle-brachial index indicates severe left lower extremity arterial disease. ABI of LLE likely elevated due to infection, possibly underestimating level of disease.  *See table(s) above for measurements and observations.  Electronically signed by Jamelle Haring on 01/22/2021 at 5:02:36 PM.    Final        LOS: 3 days   [facility] Pager on [email or web address]  01/23/2021, 11:06 AM

## 2021-01-23 NOTE — Progress Notes (Signed)
   01/23/21 2058  Assess: MEWS Score  Temp 98.1 F (36.7 C)  BP 118/76  Pulse Rate (!) 102  Resp (!) 9  Level of Consciousness Alert  SpO2 94 %  O2 Device Nasal Cannula  Patient Activity (if Appropriate) In bed  O2 Flow Rate (L/min) 5 L/min  Assess: MEWS Score  MEWS Temp 0  MEWS Systolic 0  MEWS Pulse 1  MEWS RR 1  MEWS LOC 0  MEWS Score 2  MEWS Score Color Yellow  Assess: if the MEWS score is Yellow or Red  Were vital signs taken at a resting state? Yes  Focused Assessment No change from prior assessment (new admit)  Early Detection of Sepsis Score *See Row Information* Low  MEWS guidelines implemented *See Row Information* No, other (Comment) (new admit)  Take Vital Signs  Increase Vital Sign Frequency  Yellow: Q 2hr X 2 then Q 4hr X 2, if remains yellow, continue Q 4hrs  Escalate  MEWS: Escalate Yellow: discuss with charge nurse/RN and consider discussing with provider and RRT  Notify: Charge Nurse/RN  Name of Charge Nurse/RN Notified Information systems manager  Date Charge Nurse/RN Notified 01/23/21  Time Charge Nurse/RN Notified 2143  Notify: Provider  Provider Name/Title Omna  Date Provider Notified 01/23/21  Time Provider Notified 2139  Notification Type Page  Notification Reason Other (Comment) (respirations 9, new admit)  Response No new orders  Date of Provider Response 01/23/21  Time of Provider Response 2144  Notify: Rapid Response  Name of Rapid Response RN Notified  (N/A)  Document  Patient Outcome Other (Comment) (will continue to monitor, pt new admit, unsure of baseline)  Progress note created (see row info) Yes    No new orders will continue to monitor patient. Dr. Epifania Gore notified of respirations and HR.

## 2021-01-24 ENCOUNTER — Ambulatory Visit: Payer: Medicare Other | Admitting: Hematology and Oncology

## 2021-01-24 ENCOUNTER — Other Ambulatory Visit: Payer: Medicare Other

## 2021-01-24 ENCOUNTER — Telehealth: Payer: Self-pay | Admitting: Hematology and Oncology

## 2021-01-24 DIAGNOSIS — K7290 Hepatic failure, unspecified without coma: Secondary | ICD-10-CM | POA: Diagnosis not present

## 2021-01-24 DIAGNOSIS — A419 Sepsis, unspecified organism: Secondary | ICD-10-CM | POA: Diagnosis not present

## 2021-01-24 DIAGNOSIS — L0390 Cellulitis, unspecified: Secondary | ICD-10-CM | POA: Diagnosis not present

## 2021-01-24 DIAGNOSIS — K7031 Alcoholic cirrhosis of liver with ascites: Secondary | ICD-10-CM | POA: Diagnosis not present

## 2021-01-24 DIAGNOSIS — I70229 Atherosclerosis of native arteries of extremities with rest pain, unspecified extremity: Secondary | ICD-10-CM | POA: Diagnosis not present

## 2021-01-24 LAB — URINALYSIS, ROUTINE W REFLEX MICROSCOPIC
Bilirubin Urine: NEGATIVE
Glucose, UA: NEGATIVE mg/dL
Ketones, ur: 5 mg/dL — AB
Leukocytes,Ua: NEGATIVE
Nitrite: NEGATIVE
Protein, ur: 30 mg/dL — AB
Specific Gravity, Urine: 1.019 (ref 1.005–1.030)
pH: 5 (ref 5.0–8.0)

## 2021-01-24 LAB — CBC
HCT: 41.2 % (ref 39.0–52.0)
Hemoglobin: 13.1 g/dL (ref 13.0–17.0)
MCH: 32.8 pg (ref 26.0–34.0)
MCHC: 31.8 g/dL (ref 30.0–36.0)
MCV: 103 fL — ABNORMAL HIGH (ref 80.0–100.0)
Platelets: 34 10*3/uL — ABNORMAL LOW (ref 150–400)
RBC: 4 MIL/uL — ABNORMAL LOW (ref 4.22–5.81)
RDW: 18.4 % — ABNORMAL HIGH (ref 11.5–15.5)
WBC: 20.1 10*3/uL — ABNORMAL HIGH (ref 4.0–10.5)
nRBC: 0.1 % (ref 0.0–0.2)

## 2021-01-24 LAB — BASIC METABOLIC PANEL
Anion gap: 8 (ref 5–15)
BUN: 41 mg/dL — ABNORMAL HIGH (ref 8–23)
CO2: 19 mmol/L — ABNORMAL LOW (ref 22–32)
Calcium: 8.7 mg/dL — ABNORMAL LOW (ref 8.9–10.3)
Chloride: 108 mmol/L (ref 98–111)
Creatinine, Ser: 1.43 mg/dL — ABNORMAL HIGH (ref 0.61–1.24)
GFR, Estimated: 54 mL/min — ABNORMAL LOW (ref >60)
Glucose, Bld: 124 mg/dL — ABNORMAL HIGH (ref 70–99)
Potassium: 5 mmol/L (ref 3.5–5.1)
Sodium: 135 mmol/L (ref 135–145)

## 2021-01-24 LAB — PROTIME-INR
INR: 1.9 — ABNORMAL HIGH (ref 0.8–1.2)
Prothrombin Time: 20.9 seconds — ABNORMAL HIGH (ref 11.4–15.2)

## 2021-01-24 MED ORDER — FOLIC ACID 5 MG/ML IJ SOLN
1.0000 mg | Freq: Every day | INTRAMUSCULAR | Status: DC
  Administered 2021-01-24 – 2021-01-26 (×3): 1 mg via INTRAVENOUS
  Filled 2021-01-24 (×3): qty 0.2

## 2021-01-24 MED ORDER — SODIUM CHLORIDE 0.9 % IV SOLN
2.0000 g | Freq: Three times a day (TID) | INTRAVENOUS | Status: DC
  Administered 2021-01-24 – 2021-01-26 (×6): 2 g via INTRAVENOUS
  Filled 2021-01-24 (×8): qty 2

## 2021-01-24 NOTE — Progress Notes (Signed)
Patient ID: Jeffrey Snow, male   DOB: August 29, 1956, 65 y.o.   MRN: [id number] Patient with worsening medical condition, he is septic, blood cultures are positive, white blood cell count has increased to 20,000 he still has thrombocytopenia with platelets of 34,000.  INR has improved to 1.9.  Patient is not a revascularization candidate and healing a above-the-knee amputation is doubtful.  I could proceed with an above-knee amputation on Friday if patient's medical condition stabilizes.  Palliative care seems appropriate.

## 2021-01-24 NOTE — Progress Notes (Signed)
Received call from daughter, [name]. Ayden meeting scheduled for 2/10 at [facility]. Thank you.   NO CHARGE  Ihor Dow, [place], FNP-C Palliative Medicine Team  Phone: [phone] Fax: [phone]

## 2021-01-24 NOTE — Progress Notes (Signed)
Pharmacy note - antibiotic stewardship  Patient with aeromonas hydrophila in a blood culture.  He's on cefepime and vancomycin currently.  I confirmed with microbiology that cefepime is not tested for this organism.  Asked Dr. Wynelle Cleveland if we could change to ceftazidime as we have susceptibility information for this drug.  She agreed and will also stop IV vancomycin.  Heide Guile, PharmD, BCPS-AQ ID Clinical Pharmacist Phone [phone]

## 2021-01-24 NOTE — Progress Notes (Signed)
PMT consult reviewed. Discussed with RN in detail. Attempted to discuss goals of care with patient but he is hard of hearing and confused. Continues to state "help me, help me." Unfortunately daughter has left bedside for the evening. Called daughter to arrange [place] meeting, hopefully for tomorrow. If not, we can discuss over the phone. VM left. Awaiting return call. PMT will f/u in AM.   [place], [place], [place] Palliative Medicine Team  Phone: [phone] Fax: [phone]

## 2021-01-24 NOTE — Plan of Care (Signed)

## 2021-01-24 NOTE — Telephone Encounter (Signed)
Pt is currently in the hospital and has been rescheduled to see Dr. Chryl Heck on 2/17 at 1120am.

## 2021-01-24 NOTE — Progress Notes (Signed)
PROGRESS NOTE    STANCIL DEISHER   XTG:[id number]  DOB: 1956-03-05  DOA: 01/23/2021 PCP: Azzie Glatter, FNP   Brief Narrative:  Jeffrey Snow 65 y.o.malewith history of alcoholic cirrhosis with HCC, hepatitis C, hypertension, PAD, recently diagnosed epiglottic cancer, who presents with left leg pain after a fall. He was found to have cellulitis and gangrene of the left leg.    Subjective: Feels thirsty and asking for more water although the finished a cup just now. No other complaints.     Assessment & Plan:   Principal Problem:   Sepsis with Critical lower limb ischemia and cellulitis of left lower leg - Lactic acid 10 on admission - noted to bave progression of infection in hospital with concerns for ischemia - Ortho and vascular surgery consulted - he was supposed to have an arteriogram as outpatient - in hospital ABIs show PAD bilaterally- vascular surgery is suggesting he have an AKA   Active Problems: Cirrhosis with portal HTN, ascites, thrombocytopenia, elevated INR/coagulopathy, HCC - required a paracentesis of 5 L of fluid on 01/18/2021  AKI - Cr 2.86 on 01/17/2021 - baseline Cr 0.8 - ? Due to sepsis vs hepatorenal disease - Cr 1.43 now    Malignant neoplasm of supraglottis  - followed by ENT, dr Lucia Gaskins  - No surgery planned - referred to rad onc and med onc  Folic acid deficiency with macrocytic anemia -Folate level 1.9 -Continue folic acid supplements  Neuropathy -Continue gabapentin  Anxiety Continue alprazolam  Gastroesophageal reflux disease -Continue PPI   Time spent in minutes: 45 DVT prophylaxis: SCDs Code Status: Full code Family Communication:  Level of Care: Level of care: Telemetry Medical Disposition Plan:  Status is: Inpatient  Remains inpatient appropriate because:IV treatments appropriate due to intensity of illness or inability to take PO   Dispo: The patient is from: Home              Anticipated d/c is to: TBD               Anticipated d/c date is: > 3 days              Patient currently is not medically stable to d/c.   Difficult to place patient No      Consultants:   Orthopedic surgery  Vascular surgery  Palliative care  Procedures:   None Antimicrobials:  Anti-infectives (From admission, onward)   Start     Dose/Rate Route Frequency Ordered Stop   01/24/21 1100  cefTAZidime (FORTAZ) 2 g in sodium chloride 0.9 % 100 mL IVPB        2 g 200 mL/hr over 30 Minutes Intravenous Every 8 hours 01/24/21 1007     01/21/21 2200  ceFEPIme (MAXIPIME) 2 g in sodium chloride 0.9 % 100 mL IVPB  Status:  Discontinued        2 g 200 mL/hr over 30 Minutes Intravenous Every 12 hours 01/21/21 1109 01/24/21 1007   01/21/21 1800  vancomycin (VANCOCIN) IVPB 1000 mg/200 mL premix  Status:  Discontinued        1,000 mg 200 mL/hr over 60 Minutes Intravenous Every 24 hours 01/28/2021 2055 01/24/21 1007   01/21/21 1000  ceFEPIme (MAXIPIME) 2 g in sodium chloride 0.9 % 100 mL IVPB  Status:  Discontinued        2 g 200 mL/hr over 30 Minutes Intravenous Every 24 hours 01/28/2021 2050 01/21/21 1109   01/21/21 0600  ceFAZolin (ANCEF) IVPB 2g/100 mL  premix  Status:  Discontinued        2 g 200 mL/hr over 30 Minutes Intravenous On call to O.R. 01/21/21 0009 01/21/21 0145   01/16/2021 1645  vancomycin (VANCOREADY) IVPB 2000 mg/400 mL        2,000 mg 200 mL/hr over 120 Minutes Intravenous  Once 02/02/2021 1638 02/01/2021 1935   01/19/2021 1615  ceFEPIme (MAXIPIME) 2 g in sodium chloride 0.9 % 100 mL IVPB        2 g 200 mL/hr over 30 Minutes Intravenous  Once 02/07/2021 1612 01/26/2021 1736   01/19/2021 1615  vancomycin (VANCOCIN) IVPB 1000 mg/200 mL premix  Status:  Discontinued        1,000 mg 200 mL/hr over 60 Minutes Intravenous  Once 02/10/2021 1612 01/22/2021 1637       Objective: Vitals:   01/23/21 2300 01/24/21 0100 01/24/21 0500 01/24/21 0756  BP: 121/79 (!) 148/72 (!) 157/76 124/83  Pulse: 94 97 97 97  Resp: (!) 8 (!) 9  (!) 8 10  Temp: 98.3 F (36.8 C) 98.4 F (36.9 C) (!) 97.4 F (36.3 C) (!) 97.4 F (36.3 C)  TempSrc: Oral Oral Axillary Oral  SpO2: 95% 94% 93% 96%  Weight:      Height:        Intake/Output Summary (Last 24 hours) at 01/24/2021 1250 Last data filed at 01/24/2021 0949 Gross per 24 hour  Intake 1508.02 ml  Output 1400 ml  Net 108.02 ml   Filed Weights   02/09/2021 2034 01/21/21 0004  Weight: 86.2 kg 83.2 kg    Examination: General exam: Appears comfortable  HEENT: PERRLA, oral mucosa moist, no sclera icterus or thrush Respiratory system: Clear to auscultation. Respiratory effort normal. Cardiovascular system: S1 & S2 heard, RRR.   Gastrointestinal system: Abdomen soft, non-tender, nondistended. Normal bowel sounds. Central nervous system: Alert and oriented. No focal neurological deficits. Extremities: No cyanosis, clubbing or edema Skin: No rashes or ulcers Psychiatry:  Mood & affect appropriate.   Data Reviewed: I have personally reviewed following labs and imaging studies  CBC: Recent Labs  Lab 02/12/2021 1509 01/21/21 0228 01/22/21 0216 01/23/21 0533 01/24/21 0256  WBC 14.4* 10.2 10.2 14.5* 20.1*  NEUTROABS 13.0*  --   --   --   --   HGB 12.2* 9.6* 9.2* 11.4* 13.1  HCT 38.4* 30.4* 28.9* 35.3* 41.2  MCV 106.1* 106.3* 104.3* 103.8* 103.0*  PLT 48* 33* 36* 33* 34*   Basic Metabolic Panel: Recent Labs  Lab 01/21/2021 1509 01/21/21 0228 01/22/21 0216 01/23/21 0533 01/24/21 0256  NA 134* 135 134* 135 135  K 5.3* 5.2* 5.2* 4.5 5.0  CL 103 103 103 106 108  CO2 15* 17* 19* 20* 19*  GLUCOSE 46* 117* 109* 121* 124*  BUN 43* 42* 47* 47* 41*  CREATININE 2.86* 2.31* 2.16* 1.66* 1.43*  CALCIUM 8.3* 8.3* 8.4* 8.5* 8.7*   GFR: Estimated Creatinine Clearance: 50.7 mL/min (A) (by C-G formula based on SCr of 1.43 mg/dL (H)). Liver Function Tests: Recent Labs  Lab 02/12/2021 1509 01/21/21 0228  AST 74* 51*  ALT 42 24  ALKPHOS 151* 84  BILITOT 3.9* 3.2*  PROT 5.0*  5.0*  ALBUMIN 2.0* 3.1*   No results for input(s): LIPASE, AMYLASE in the last 168 hours. Recent Labs  Lab 02/02/2021 1806  AMMONIA 37*   Coagulation Profile: Recent Labs  Lab 01/18/2021 1509 01/21/21 0228 01/24/21 0256  INR 2.2* 2.9* 1.9*   Cardiac Enzymes: No results for input(s):  CKTOTAL, CKMB, CKMBINDEX, TROPONINI in the last 168 hours. BNP (last 3 results) No results for input(s): PROBNP in the last 8760 hours. HbA1C: No results for input(s): HGBA1C in the last 72 hours. CBG: Recent Labs  Lab 01/21/21 0019 01/21/21 0112 01/21/21 0229 01/21/21 0350 01/23/21 1658  GLUCAP 100* 96 103* 106* 119*   Lipid Profile: No results for input(s): CHOL, HDL, LDLCALC, TRIG, CHOLHDL, LDLDIRECT in the last 72 hours. Thyroid Function Tests: No results for input(s): TSH, T4TOTAL, FREET4, T3FREE, THYROIDAB in the last 72 hours. Anemia Panel: Recent Labs    01/22/21 0216  VITAMINB12 1,230*  FOLATE 1.9*  FERRITIN 660*  TIBC NOT CALCULATED  IRON 11*  RETICCTPCT 2.8   Urine analysis:    Component Value Date/Time   COLORURINE AMBER (A) 01/24/2021 0650   APPEARANCEUR CLEAR 01/24/2021 0650   LABSPEC 1.019 01/24/2021 0650   PHURINE 5.0 01/24/2021 0650   GLUCOSEU NEGATIVE 01/24/2021 0650   HGBUR LARGE (A) 01/24/2021 0650   BILIRUBINUR NEGATIVE 01/24/2021 0650   BILIRUBINUR Negative 11/26/2019 1134   KETONESUR 5 (A) 01/24/2021 0650   PROTEINUR 30 (A) 01/24/2021 0650   UROBILINOGEN 0.2 11/26/2019 1134   UROBILINOGEN 0.2 12/17/2017 1129   NITRITE NEGATIVE 01/24/2021 0650   LEUKOCYTESUR NEGATIVE 01/24/2021 0650   Sepsis Labs: @LABRCNTIP (procalcitonin:4,lacticidven:4) ) Recent Results (from the past 240 hour(s))  Blood culture (routine single)     Status: Abnormal   Collection Time: 02/19/21  3:09 PM   Specimen: BLOOD  Result Value Ref Range Status   Specimen Description   Final    BLOOD LEFT WRIST Performed at [facility], [place] [address]  [place], [place] [address]    Special Requests   Final    BOTTLES DRAWN AEROBIC AND ANAEROBIC Blood Culture results may not be optimal due to an inadequate volume of blood received in culture bottles Performed at [facility] [address], [place], [place] [address]    Culture  Setup Time   Final    GRAM NEGATIVE RODS AEROBIC BOTTLE ONLY CRITICAL RESULT CALLED TO, READ BACK BY AND VERIFIED WITH: Coralee Rud PHARMD @1255  01/21/21 EB Performed at [facility], [place] [address], [place], [place] [address]    Culture AEROMONAS HYDROPHILA GROUP (A)  Final   Report Status 01/23/2021 FINAL  Final   Organism ID, Bacteria AEROMONAS HYDROPHILA GROUP  Final      Susceptibility   Aeromonas hydrophila group - MIC*    CEFAZOLIN 32 INTERMEDIATE Intermediate     CEFTAZIDIME <=1 SENSITIVE Sensitive     CIPROFLOXACIN <=0.25 SENSITIVE Sensitive     GENTAMICIN <=1 SENSITIVE Sensitive     IMIPENEM >=16 RESISTANT Resistant     TRIMETH/SULFA <=20 SENSITIVE Sensitive     * AEROMONAS HYDROPHILA GROUP  SARS Coronavirus 2 by RT PCR (hospital order, performed in [place] hospital lab) Nasopharyngeal Nasopharyngeal Swab     Status: None   Collection Time: 2021/02/19  5:43 PM   Specimen: Nasopharyngeal Swab  Result Value Ref Range Status   SARS Coronavirus 2 NEGATIVE NEGATIVE Final    Comment: (NOTE) SARS-CoV-2 target nucleic acids are NOT DETECTED.  The SARS-CoV-2 RNA is generally detectable in upper and lower respiratory specimens during the acute phase of infection. The lowest concentration of SARS-CoV-2 viral copies this assay can detect is 250 copies / mL. A negative result does not preclude SARS-CoV-2 infection and should not be used as the sole basis for treatment or other patient management decisions.  A negative result may occur with improper specimen collection / handling, submission of specimen other than nasopharyngeal swab, presence of viral mutation(s) within the areas  targeted by this assay, and inadequate number of viral copies (<250 copies / mL). A negative result must be combined with clinical observations, patient history, and epidemiological information.  Fact Sheet for Patients:   StrictlyIdeas.no  Fact Sheet for Healthcare Providers: BankingDealers.co.za  This test is not yet approved or  cleared by the [place] FDA and has been authorized for detection and/or diagnosis of SARS-CoV-2 by FDA under an Emergency Use Authorization (EUA).  This EUA will remain in effect (meaning this test can be used) for the duration of the COVID-19 declaration under Section 564(b)(1) of the Act, 21 U.S.C. section 360bbb-3(b)(1), unless the authorization is terminated or revoked sooner.  Performed at [facility], [place] [address], [place], [place] [address]   MRSA PCR Screening     Status: None   Collection Time: 02/11/2021 11:52 PM   Specimen: Nasopharyngeal  Result Value Ref Range Status   MRSA by PCR NEGATIVE NEGATIVE Final    Comment:        The GeneXpert MRSA Assay (FDA approved for NASAL specimens only), is one component of a comprehensive MRSA colonization surveillance program. It is not intended to diagnose MRSA infection nor to guide or monitor treatment for MRSA infections. Performed at [facility], [place] [address] [place], [place] [address]          Radiology Studies: No results found.    Scheduled Meds: . Chlorhexidine Gluconate Cloth  6 each Topical Daily  . folic acid  1 mg Intravenous Daily  . gabapentin  300 mg Oral Daily  . pantoprazole  40 mg Oral BID AC  . polyethylene glycol  17 g Oral Daily  . sodium chloride flush  3 mL Intravenous Q12H   Continuous Infusions: . cefTAZidime (FORTAZ)  IV       LOS: 4 days      Debbe Odea, MD [facility] Pager: [email or web address] 01/24/2021, 12:50 PM

## 2021-01-24 NOTE — Plan of Care (Addendum)
Patient arrived to unit decreased respirations, slightly elevated HR, EMS stated patient given dilaudid for pain and O2 dropped, pt placed on 5L of O2, pt sating in mid 90s). On assessment bilateral femoral and R dorsalis pulses found with doppler, bilateral lower extremity weakness with pitting +2 edema, bilateral upper extremities edematous pitting +1, patient abdomen extremely distended, patient dressing changed (ABD pad, xeroform gauze, kerlix). Dr. Stark Klein aware of decreased respirations. Will continue to monitor patient closely.  Patient respirations remain 8-9 bpm. Patient is talking more, constantly asking when he can go home.  Patient had no output, bladder scanner >999, scan may have picked up ascites volume as well, patient in and out with 664ml, little bit of blood in urine in blood, blisters on perineal area   Problem: Education: Goal: Knowledge of General Education information will improve Description: Including pain rating scale, medication(s)/side effects and non-pharmacologic comfort measures Outcome: Progressing   Problem: Activity: Goal: Risk for activity intolerance will decrease Outcome: Progressing   Problem: Pain Managment: Goal: General experience of comfort will improve Outcome: Progressing   Problem: Safety: Goal: Ability to remain free from injury will improve Outcome: Progressing   Problem: Skin Integrity: Goal: Risk for impaired skin integrity will decrease Outcome: Progressing

## 2021-01-25 DIAGNOSIS — N179 Acute kidney failure, unspecified: Secondary | ICD-10-CM | POA: Diagnosis not present

## 2021-01-25 DIAGNOSIS — Z66 Do not resuscitate: Secondary | ICD-10-CM

## 2021-01-25 DIAGNOSIS — D696 Thrombocytopenia, unspecified: Secondary | ICD-10-CM

## 2021-01-25 DIAGNOSIS — Z515 Encounter for palliative care: Secondary | ICD-10-CM

## 2021-01-25 DIAGNOSIS — Z7189 Other specified counseling: Secondary | ICD-10-CM

## 2021-01-25 DIAGNOSIS — L0390 Cellulitis, unspecified: Secondary | ICD-10-CM | POA: Diagnosis not present

## 2021-01-25 DIAGNOSIS — C220 Liver cell carcinoma: Secondary | ICD-10-CM

## 2021-01-25 DIAGNOSIS — I70229 Atherosclerosis of native arteries of extremities with rest pain, unspecified extremity: Secondary | ICD-10-CM | POA: Diagnosis not present

## 2021-01-25 DIAGNOSIS — L03119 Cellulitis of unspecified part of limb: Secondary | ICD-10-CM | POA: Diagnosis not present

## 2021-01-25 DIAGNOSIS — K7290 Hepatic failure, unspecified without coma: Secondary | ICD-10-CM | POA: Diagnosis not present

## 2021-01-25 LAB — CBC
HCT: 37.1 % — ABNORMAL LOW (ref 39.0–52.0)
Hemoglobin: 12 g/dL — ABNORMAL LOW (ref 13.0–17.0)
MCH: 32 pg (ref 26.0–34.0)
MCHC: 32.3 g/dL (ref 30.0–36.0)
MCV: 98.9 fL (ref 80.0–100.0)
Platelets: 34 10*3/uL — ABNORMAL LOW (ref 150–400)
RBC: 3.75 MIL/uL — ABNORMAL LOW (ref 4.22–5.81)
RDW: 17.8 % — ABNORMAL HIGH (ref 11.5–15.5)
WBC: 15.2 10*3/uL — ABNORMAL HIGH (ref 4.0–10.5)
nRBC: 0.1 % (ref 0.0–0.2)

## 2021-01-25 LAB — URINE CULTURE: Culture: NO GROWTH

## 2021-01-25 LAB — BASIC METABOLIC PANEL
Anion gap: 11 (ref 5–15)
BUN: 39 mg/dL — ABNORMAL HIGH (ref 8–23)
CO2: 19 mmol/L — ABNORMAL LOW (ref 22–32)
Calcium: 8.5 mg/dL — ABNORMAL LOW (ref 8.9–10.3)
Chloride: 104 mmol/L (ref 98–111)
Creatinine, Ser: 1.2 mg/dL (ref 0.61–1.24)
GFR, Estimated: >60 mL/min (ref >60)
Glucose, Bld: 78 mg/dL (ref 70–99)
Potassium: 4.8 mmol/L (ref 3.5–5.1)
Sodium: 134 mmol/L — ABNORMAL LOW (ref 135–145)

## 2021-01-25 MED ORDER — FUROSEMIDE 40 MG PO TABS
40.0000 mg | ORAL_TABLET | Freq: Every day | ORAL | Status: DC
  Administered 2021-01-25 – 2021-01-26 (×2): 40 mg via ORAL
  Filled 2021-01-25 (×3): qty 1

## 2021-01-25 NOTE — Plan of Care (Signed)

## 2021-01-25 NOTE — Consult Note (Signed)
Consultation Note Date: 01/25/2021   Patient Name: Jeffrey Snow  DOB: 1956/03/02  MRN: [id number]  Age / Sex: 65 y.o., male  PCP: Azzie Glatter, FNP Referring Physician: Debbe Odea, MD  Reason for Consultation: Establishing goals of care  HPI/Patient Profile: 65 y.o. male  with past medical history of alcoholic cirrhosis, hepatocellular carcinoma, hepatitis C, hypertension, PAD, recently diagnosed epiglottic cancer receiving radiation admitted on 02/05/2021 with left leg pain after a fall. Patient found to have cellulitis and left leg gangrene. Hospital admission for sepsis with critical lower limb ischemia, cellulitis LL leg with underlying cirrhosis with portal HTN, splenomegaly, ascites s/p paracentesis removed 5L, thrombocytopenia, coagulopathy. Vascular surgery and ortho following. Hospital ABI's reveal bilateral PAD. Vascular recommending AKA. Ortho, Dr. Sharol Given feels patient is a poor surgical candidate with unlikelihood of AKA healing following surgery due to multiple underlying co-morbidities. Palliative medicine consultation for goals of care.   Clinical Assessment and Goals of Care:  I have reviewed medical records, discussed with care team, and met with patient and daughter [name]) at bedside to discuss goals of care.   I introduced Palliative Medicine as specialized medical care for people living with serious illness. It focuses on providing relief from the symptoms and stress of a serious illness. The goal is to improve quality of life for both the patient and the family.  Patient is alert, oriented and able to participate in discussion but hard of hearing and ? His understanding of severity of his situation with intermittent confusion. [name] reports her father has a documented living will with brother Sonia Side) as primary POA and [name] [name]) as secondary POA. Offered to call brother  Sonia Side to discuss [place]. [name] shares that she does not have Jerry's contact information and I will have to contact patient's brother [name] to receive information. Reassured [name] that I will call [name] in hopes of talking to reported, documented POA Sonia Side who lives in [place].   [name] shares that prior to hospitalization, her father was still living home alone. She reports "good and bad days." He experienced a fall in October, was discharged to rehab and eventually returned home. Reviewed his course of chronic illness including liver cirrhosis and liver cancer.   Discussed events leading up to admission and course of hospitalization including diagnoses, interventions, plan of care. [name] reports that her and her father have not discussed options with other providers. Frankly and compassionately shared specialist recommendations with patient and daughter--the need for AKA due to severe PAD and limb ischemia but high risk for surgery and chances that the wound will heal. Explained need for discharge to a nursing facility to receive 24/7 care. Shared rock in a hard place with concern that Keean's time on earth is short despite pursuing AKA or shifting to comfort with liver cirrhosis and failure as well as two cancers. Discussed quality versus quantity of life.   Patient states "so you are telling me I am going to die?" Compassionately shared that yes, Harlow is going to die, not today  but with poor prognosis. Whether amputation is performed or not.    Again, this is the first time patient and daughter are hearing about severity of his condition. They need time to process this decision. Patient verbalizes he does NOT want to live in a nursing home. Support provided. Reassured of f/u this afternoon before daughter leaves.   **Returned back shortly after. [name] shares that he wishes to make this decision tomorrow. Reassured [name] that PMT provider will reach out to [facility] in hopes of getting Jerry's  number. Also plan for f/u in AM.    **Spoke with patient's brother, Larrell Rapozo via telephone. [name] shares that yes, Kaylob has a documented living will with brother Sonia Side as primary [place]. [name] tells me that he texts Sonia Side daily and unfortunately Sonia Side wants nothing to do with [place] and does not wish to make medical decisions for Garrell. [name] shares that [name], [name] is secondary HCPOA and tells me to call [name].   **Conference call with daughter, [name] and [name]/patient's ex who is documented secondary HCPOA Wanda Plump). [name] has been updated by [name]. She has a good understanding of Ramir's condition and prognosis. She shares that she does not wish for him to die but also is realistic that he is very sick and she does not wish to see him suffer. Discussed continued aggressive medical management vs. Comfort focused pathway and consideration of hospice for Erbie, in order to have quality of life as he nears EOL and good symptom management. [name] speaks of wishing for comfort for Zi. "hospice would be best." She does wish to follow-up with him tomorrow morning before final decisions are made.   [name] has found Dearion's documented living will and HCPOA. She confirms POA's. Also that her father has clearly documented in the event of terminal or incurable condition, he would not wish for life prolonging measures. Medically recommended they consider DNR/DNI code status if his condition took a turn for the worst in the next 24 hours, sharing that this will be very traumatic for him at EOL and that Eriq would not survive a resuscitative effort with how ill he is. [name] and [name] agree with decision for DNR/DNI code status. They do not want him to suffer.   Rickard Patience and I plan to f/u with Christia Reading tomorrow 2/11 at [place]. Family is leaning towards comfort but would like to have another conversation with him to see if he understands how sick he is.   Answered  questions. Daughter has PMT contact information.   **Updated Dr. Wynelle Cleveland and RN.    SUMMARY OF RECOMMENDATIONS    Daughter reports Katrell has a documented living will/HCPOA. Daughter will bring documentation to [facility].   Daughter reports patient's brother Hagan Vanauken) is primary HCPOA and ex/[name] Wanda Plump) is secondary HCPOA. Sonia Side is estranged and per patient's other brother [name], does NOT wish to make medical decisions for patient.   GOC discussion with daughter [name]) and [name].  Decision for DNR/DNI code status.   [name] and [name] understand his condition and poor prognosis. Leaning toward comfort pathway but would like to meet again with Remiel and PMT in AM to further discuss. Will meet with family 2/11 at [place]:  DNR/DNI following discussion with patient's reported secondary HCPOA Wanda Plump) and daughter [name])  Symptom Management:   Per attending  Palliative Prophylaxis:   Aspiration, Bowel Regimen, Delirium Protocol, Frequent Pain Assessment, Oral Care and Turn Reposition  Psycho-social/Spiritual:   Desire for further Chaplaincy support: yes  Additional Recommendations: Caregiving  Support/Resources and Compassionate Wean Education  Prognosis:   Poor prognosis  Discharge Planning: To Be Determined      Primary Diagnoses: Present on Admission: . Thrombocytopenia ([place]) . Claudication of left lower extremity ([place]) . PAD (peripheral artery disease) ([place]) . HTN (hypertension) . Cirrhosis, alcoholic ([place]) . Hepatitis C . HCC (hepatocellular carcinoma) ([place]) . Lactic acidosis . Cellulitis of leg . Malignant neoplasm of supraglottis ([place])   I have reviewed the medical record, interviewed the patient and family, and examined the patient. The following aspects are pertinent.  Past Medical History:  Diagnosis Date  . Abnormal MRI of abdomen, liver  04/21/2014  . Anxiety   . Cirrhosis, alcoholic  ([place]) 2/0/9470  . Claudication of left lower extremity ([place]) 10/2020  . Depression   . [place] (hepatocellular carcinoma) ([place])   . Hearing deficit   . Hepatitis C   . Hypertension   . Liver mass   . Panic attack    Social History   Socioeconomic History  . Marital status: Single    Spouse name: Not on file  . Number of children: 3  . Years of education: Not on file  . Highest education level: Not on file  Occupational History    Employer: [employer]  Tobacco Use  . Smoking status: Former Smoker    Packs/day: 0.50    Years: 35.00    Pack years: 17.50    Quit date: 08/21/2011    Years since quitting: 9.4  . Smokeless tobacco: Never Used  Vaping Use  . Vaping Use: Never used  Substance and Sexual Activity  . Alcohol use: Not Currently    Alcohol/week: 0.0 standard drinks    Comment: Occasional beer on the weekends  . Drug use: No    Types: Cocaine    Comment: abused cocaine in the 80's  . Sexual activity: Not Currently  Other Topics Concern  . Not on file  Social History Narrative   Single, never married.  Lives alone.  3 daughters, 16 grandchildren who are not very involved in the patient's care.  Quit smoking and heavy drinking 3.5 years ago.    Social Determinants of Health   Financial Resource Strain: High Risk  . Difficulty of Paying Living Expenses: Hard  Food Insecurity: No Food Insecurity  . Worried About Charity fundraiser in the Last Year: Never true  . Ran Out of Food in the Last Year: Never true  Transportation Needs: No Transportation Needs  . Lack of Transportation (Medical): No  . Lack of Transportation (Non-Medical): No  Physical Activity: Inactive  . Days of Exercise per Week: 0 days  . Minutes of Exercise per Session: 0 min  Stress: Stress Concern Present  . Feeling of Stress : Very much  Social Connections: Not on file   Family History  Problem Relation Age of Onset  . Diabetes Father   . Diabetes Brother   . Diabetes Brother     Scheduled Meds: . Chlorhexidine Gluconate Cloth  6 each Topical Daily  . folic acid  1 mg Intravenous Daily  . furosemide  40 mg Oral Daily  . gabapentin  300 mg Oral Daily  . pantoprazole  40 mg Oral BID AC  . polyethylene glycol  17 g Oral Daily  . sodium chloride flush  3 mL Intravenous Q12H   Continuous Infusions: . cefTAZidime (FORTAZ)  IV 2 g (01/25/21 1417)  PRN Meds:.acetaminophen, albuterol, ALPRAZolam, HYDROcodone-acetaminophen, HYDROmorphone (DILAUDID) injection, ondansetron **OR** ondansetron (ZOFRAN) IV Medications Prior to Admission:  Prior to Admission medications   Medication Sig Start Date End Date Taking? Authorizing Provider  albuterol (VENTOLIN HFA) 108 (90 Base) MCG/ACT inhaler Inhale 2 puffs into the lungs every 6 (six) hours as needed for wheezing or shortness of breath. 07/04/20  Yes Azzie Glatter, FNP  ALPRAZolam Duanne Moron) 1 MG tablet Take 1 mg by mouth 2 (two) times daily.   Yes [provider]  bismuth subsalicylate (PEPTO BISMOL) 262 MG/15ML suspension Take 30 mLs by mouth every 6 (six) hours as needed for diarrhea or loose stools.   Yes [provider]  Dextromethorphan-guaiFENesin (ROBITUSSIN COUGH+CHEST CONG DM) 20-200 MG/20ML LIQD Take 10 mLs by mouth every 4 (four) hours as needed (cough).   Yes [provider]  furosemide (LASIX) 20 MG tablet Take 1 tablet (20 mg total) by mouth daily. 11/22/20  Yes Azzie Glatter, FNP  gabapentin (NEURONTIN) 300 MG capsule Take 1 capsule (300 mg total) by mouth 3 (three) times daily. 06/06/20  Yes Azzie Glatter, FNP  pantoprazole (PROTONIX) 40 MG tablet TAKE 1 TABLET(40 MG) BY MOUTH TWICE DAILY BEFORE A MEAL Patient taking differently: Take 40 mg by mouth 2 (two) times daily. 11/22/20  Yes Azzie Glatter, FNP  potassium chloride SA (KLOR-CON) 20 MEQ tablet Take 1 tablet (20 mEq total) by mouth daily. 12/22/20  Yes Truitt Merle, MD  spironolactone (ALDACTONE) 25 MG tablet Take 3 tablets (75  mg total) by mouth daily. 10/07/20  Yes Nuala Alpha A, PA-C  loperamide (IMODIUM) 2 MG capsule Take 1-2 capsules (2-4 mg total) by mouth as needed for diarrhea or loose stools. Patient not taking: No sig reported 11/30/20   Alla Feeling, NP  ondansetron (ZOFRAN) 8 MG tablet Take 1 tablet (8 mg total) by mouth every 8 (eight) hours as needed for nausea or vomiting. Patient not taking: Reported on 01/17/2021 11/30/20   Alla Feeling, NP   Allergies  Allergen Reactions  . Aspirin Other (See Comments)    GI bleeding  . Nsaids Other (See Comments)    Gi bleeding  . Folic Acid Other (See Comments)    Makes stomach feel funny  . Oxycodone Itching and Other (See Comments)    Cause insomnia   Review of Systems  Unable to perform ROS: Acuity of condition   Physical Exam Vitals and nursing note reviewed.  Constitutional:      General: He is awake.     Appearance: He is ill-appearing.  HENT:     Head: Normocephalic and atraumatic.  Cardiovascular:     Rate and Rhythm: Normal rate.  Pulmonary:     Effort: No tachypnea, accessory muscle usage or respiratory distress.  Abdominal:     General: There is distension.     Tenderness: There is no abdominal tenderness.     Comments: ascites  Skin:    General: Skin is warm and dry.     Comments: Ischemic foot  Neurological:     Mental Status: He is alert.     Comments: Alert, awake, able to participate in conversation but ? Insight on his condition and hard of hearing.      Vital Signs: BP (!) 158/80 (BP Location: Right Arm)   Pulse 97   Temp 98.7 F (37.1 C)   Resp 16   Ht 5' 8.5" (1.74 m)   Wt 83.2 kg   SpO2 97%  BMI 27.48 kg/m  Pain Scale: 0-10   Pain Score: 0-No pain   SpO2: SpO2: 97 % O2 Device:SpO2: 97 % O2 Flow Rate: .O2 Flow Rate (L/min): 2 L/min  IO: Intake/output summary:  No intake or output data in the 24 hours ending 01/25/21 1712  LBM: Last BM Date: 01/22/21 Baseline Weight: Weight: 86.2 kg Most  recent weight: Weight: 83.2 kg     Palliative Assessment/Data: PPS 30%   Flowsheet Rows   Flowsheet Row Most Recent Value  Intake Tab   Referral Department Hospitalist  Unit at Time of Referral Med/Surg Unit  Palliative Care Primary Diagnosis Sepsis/Infectious Disease  Palliative Care Type New Palliative care  Reason for referral Clarify Goals of Care  Date first seen by Palliative Care 01/24/21  Clinical Assessment   Palliative Performance Scale Score 30%  Psychosocial & Spiritual Assessment   Palliative Care Outcomes   Patient/Family meeting held? Yes  Who was at the meeting? patient, daughter, also spoke with patient's ex/documented POA [name])  Palliative Care Outcomes Clarified goals of care, Counseled regarding hospice, Provided end of life care assistance, Provided psychosocial or spiritual support, Changed CPR status, ACP counseling assistance      Time Total: 90 (mulitple conversations with patient/family today) Greater than 50%  of this time was spent counseling and coordinating care related to the above assessment and plan.  Signed by:  Ihor Dow, DNP, FNP-C Palliative Medicine Team  Phone: [phone] Fax: [phone]  Please contact Palliative Medicine Team phone at [phone] for questions and concerns.  For individual provider: See Shea Evans

## 2021-01-25 NOTE — Progress Notes (Signed)
SLP Cancellation Note  Patient Details Name: Jeffrey Snow MRN: [id number] DOB: 1956/01/15   Cancelled treatment:       Reason Eval/Treat Not Completed: Other (comment). Following along via chart awaiting greater medical stability. Depending on plan of care SLP will continue preradiation therapeutic interventions including baseline MBS as head and neck radiation likely to impact swallow function in the future and require exercises to prevent fibrosis of swallowing musculature.  Will f/u after family meeting today depending on pt/family wishes.    Christyl Osentoski, Katherene Ponto 01/25/2021, 7:50 AM

## 2021-01-25 NOTE — Progress Notes (Signed)
PROGRESS NOTE    [name]   SEG:[id number]  DOB: Jan 26, 1956  DOA: 02/10/2021 PCP: Azzie Glatter, FNP   Brief Narrative:  Jeffrey Snow 65 y.o.malewith history of alcoholic cirrhosis with HCC, hepatitis C, hypertension, PAD, recently diagnosed epiglottic cancer, who presents with left leg pain after a fall. He was found to have cellulitis and gangrene of the left leg.    Subjective: Feels thirsty and asking for more water although the finished a cup just now. No other complaints.     Assessment & Plan:   Principal Problem:   Sepsis with Critical lower limb ischemia and cellulitis of left lower leg - Lactic acid 10 on admission - noted to bave progression of infection in hospital with concerns for ischemia - Ortho and vascular surgery consulted - he was supposed to have an arteriogram as outpatient but this was unable to be completed - in hospital ABIs show PAD bilaterally- vascular surgery is suggesting he have an AKA - I have spoken with the patient's daughter today as he is not in any condition to make a decision for himself - unfortunately, due to his severe livere cirrhosis, recurrent ascites, poor nutritional status, 2 underlying cancers and coagulopathic state (elevated INR and platelets ~ 30), he is a very poor candidate for surgery and may not recover from surgery enough to have a good quality of life - of note, his daughter states that her mother and the patient's brother are the POAs- palliative care will continue discussions about GOC   Active Problems: Cirrhosis with portal HTN, splenomegaly, ascites, thrombocytopenia, elevated INR/coagulopathy, h/o Hepatocellular cancer s/p ablation - required a paracentesis of 5 L of fluid on 01/18/2021 - as his abdomen is getting distended, will resume lasix today (on hold for AKI)- cont to hold Aldactone  AKI - Cr 2.86 on 02/11/2021- ? If due to diuretics- steadily improving with holding diuretics- IVF stopped yesterday  as abdomen began to fill with fluid again - baseline Cr 0.8   Acute metabolic encephalopathy - per daughter, patient is usually not this confused at baseline - Ammonia level was only 37 on 2/5 and thus confusion may be due to acute infection - will check ammonia again tomorrow    Malignant neoplasm of supraglottis  - followed by ENT, Dr Lucia Gaskins  - No surgery planned - referred to rad onc and med onc but has not had evaluations yet  Folic acid deficiency with macrocytic anemia -Folate level 1.9 -Continue folic acid supplements  Neuropathy -Continue gabapentin  Anxiety Continue alprazolam  Gastroesophageal reflux disease -Continue PPI   Time spent in minutes: 45 DVT prophylaxis: SCDs Code Status: Full code Family Communication: with daughter, [name] Level of Care: Level of care: Telemetry Medical Disposition Plan:  Status is: Inpatient  Remains inpatient appropriate because:IV treatments appropriate due to intensity of illness or inability to take PO  Awaiting family to make decision about amputation of leg vs comfort care   Dispo: The patient is from: Home              Anticipated d/c is to: TBD              Anticipated d/c date is: > 3 days              Patient currently is not medically stable to d/c.   Difficult to place patient No  Consultants:   Orthopedic surgery  Vascular surgery  Palliative care  Procedures:   None Antimicrobials:  Anti-infectives (From admission, onward)   Start     Dose/Rate Route Frequency Ordered Stop   01/24/21 1100  cefTAZidime (FORTAZ) 2 g in sodium chloride 0.9 % 100 mL IVPB        2 g 200 mL/hr over 30 Minutes Intravenous Every 8 hours 01/24/21 1007     01/21/21 2200  ceFEPIme (MAXIPIME) 2 g in sodium chloride 0.9 % 100 mL IVPB  Status:  Discontinued        2 g 200 mL/hr over 30 Minutes Intravenous Every 12 hours 01/21/21 1109 01/24/21 1007   01/21/21 1800  vancomycin (VANCOCIN) IVPB 1000 mg/200 mL premix  Status:   Discontinued        1,000 mg 200 mL/hr over 60 Minutes Intravenous Every 24 hours 01/18/2021 2055 01/24/21 1007   01/21/21 1000  ceFEPIme (MAXIPIME) 2 g in sodium chloride 0.9 % 100 mL IVPB  Status:  Discontinued        2 g 200 mL/hr over 30 Minutes Intravenous Every 24 hours 02/12/2021 2050 01/21/21 1109   01/21/21 0600  ceFAZolin (ANCEF) IVPB 2g/100 mL premix  Status:  Discontinued        2 g 200 mL/hr over 30 Minutes Intravenous On call to O.R. 01/21/21 0009 01/21/21 0145   01/21/2021 1645  vancomycin (VANCOREADY) IVPB 2000 mg/400 mL        2,000 mg 200 mL/hr over 120 Minutes Intravenous  Once 01/21/2021 1638 02/09/2021 1935   01/31/2021 1615  ceFEPIme (MAXIPIME) 2 g in sodium chloride 0.9 % 100 mL IVPB        2 g 200 mL/hr over 30 Minutes Intravenous  Once 01/28/2021 1612 01/24/2021 1736   01/17/2021 1615  vancomycin (VANCOCIN) IVPB 1000 mg/200 mL premix  Status:  Discontinued        1,000 mg 200 mL/hr over 60 Minutes Intravenous  Once 02/12/2021 1612 02/12/2021 1637       Objective: Vitals:   01/24/21 2223 01/24/21 2255 01/24/21 2258 01/25/21 0741  BP: (!) 147/62 (!) 147/62 (!) 147/62 (!) 141/62  Pulse: (!) 102 (!) 102 (!) 102 97  Resp: 16 16 16  (!) 9  Temp: 98.1 F (36.7 C) 98.1 F (36.7 C) 98.1 F (36.7 C) 98.2 F (36.8 C)  TempSrc:    Oral  SpO2:   98% 96%  Weight:      Height:        Intake/Output Summary (Last 24 hours) at 01/25/2021 1220 Last data filed at 01/24/2021 1440 Gross per 24 hour  Intake 340 ml  Output --  Net 340 ml   Filed Weights   02/03/2021 2034 01/21/21 0004  Weight: 86.2 kg 83.2 kg    Examination: General exam: Appears comfortable  HEENT: PERRLA, oral mucosa moist, no sclera icterus or thrush Respiratory system: Clear to auscultation. Respiratory effort normal. Cardiovascular system: S1 & S2 heard, regular rate and rhythm Gastrointestinal system: Abdomen soft, non-tender, nondistended. Normal bowel sounds   Central nervous system: Alert and oriented to person  only- No focal neurological deficits. Extremities: No cyanosis, clubbing - edema of left leg with redness and sloughing of skin. Psychiatry:  Mood & affect appropriate.   Data Reviewed: I have personally reviewed following labs and imaging studies  CBC: Recent Labs  Lab 01/18/2021 1509 01/21/21 0228 01/22/21 0216 01/23/21 0533 01/24/21 0256 01/25/21 0118  WBC 14.4* 10.2 10.2 14.5* 20.1* 15.2*  NEUTROABS 13.0*  --   --   --   --   --  HGB 12.2* 9.6* 9.2* 11.4* 13.1 12.0*  HCT 38.4* 30.4* 28.9* 35.3* 41.2 37.1*  MCV 106.1* 106.3* 104.3* 103.8* 103.0* 98.9  PLT 48* 33* 36* 33* 34* 34*   Basic Metabolic Panel: Recent Labs  Lab 01/21/21 0228 01/22/21 0216 01/23/21 0533 01/24/21 0256 01/25/21 0118  NA 135 134* 135 135 134*  K 5.2* 5.2* 4.5 5.0 4.8  CL 103 103 106 108 104  CO2 17* 19* 20* 19* 19*  GLUCOSE 117* 109* 121* 124* 78  BUN 42* 47* 47* 41* 39*  CREATININE 2.31* 2.16* 1.66* 1.43* 1.20  CALCIUM 8.3* 8.4* 8.5* 8.7* 8.5*   GFR: Estimated Creatinine Clearance: 60.4 mL/min (by C-G formula based on SCr of 1.2 mg/dL). Liver Function Tests: Recent Labs  Lab 01/25/2021 1509 01/21/21 0228  AST 74* 51*  ALT 42 24  ALKPHOS 151* 84  BILITOT 3.9* 3.2*  PROT 5.0* 5.0*  ALBUMIN 2.0* 3.1*   No results for input(s): LIPASE, AMYLASE in the last 168 hours. Recent Labs  Lab 01/19/2021 1806  AMMONIA 37*   Coagulation Profile: Recent Labs  Lab 02/08/2021 1509 01/21/21 0228 01/24/21 0256  INR 2.2* 2.9* 1.9*   Cardiac Enzymes: No results for input(s): CKTOTAL, CKMB, CKMBINDEX, TROPONINI in the last 168 hours. BNP (last 3 results) No results for input(s): PROBNP in the last 8760 hours. HbA1C: No results for input(s): HGBA1C in the last 72 hours. CBG: Recent Labs  Lab 01/21/21 0019 01/21/21 0112 01/21/21 0229 01/21/21 0350 01/23/21 1658  GLUCAP 100* 96 103* 106* 119*   Lipid Profile: No results for input(s): CHOL, HDL, LDLCALC, TRIG, CHOLHDL, LDLDIRECT in the last  72 hours. Thyroid Function Tests: No results for input(s): TSH, T4TOTAL, FREET4, T3FREE, THYROIDAB in the last 72 hours. Anemia Panel: No results for input(s): VITAMINB12, FOLATE, FERRITIN, TIBC, IRON, RETICCTPCT in the last 72 hours. Urine analysis:    Component Value Date/Time   COLORURINE AMBER (A) 01/24/2021 0650   APPEARANCEUR CLEAR 01/24/2021 0650   LABSPEC 1.019 01/24/2021 0650   PHURINE 5.0 01/24/2021 0650   GLUCOSEU NEGATIVE 01/24/2021 0650   HGBUR LARGE (A) 01/24/2021 0650   BILIRUBINUR NEGATIVE 01/24/2021 0650   BILIRUBINUR Negative 11/26/2019 1134   KETONESUR 5 (A) 01/24/2021 0650   PROTEINUR 30 (A) 01/24/2021 0650   UROBILINOGEN 0.2 11/26/2019 1134   UROBILINOGEN 0.2 12/17/2017 1129   NITRITE NEGATIVE 01/24/2021 0650   LEUKOCYTESUR NEGATIVE 01/24/2021 0650   Sepsis Labs: @LABRCNTIP (procalcitonin:4,lacticidven:4) ) Recent Results (from the past 240 hour(s))  Blood culture (routine single)     Status: Abnormal   Collection Time: 01/27/2021  3:09 PM   Specimen: BLOOD  Result Value Ref Range Status   Specimen Description   Final    BLOOD LEFT WRIST Performed at [facility] [address], [place], [place] [address]    Special Requests   Final    BOTTLES DRAWN AEROBIC AND ANAEROBIC Blood Culture results may not be optimal due to an inadequate volume of blood received in culture bottles Performed at [facility] [address], [place], [name] [address]    Culture  Setup Time   Final    GRAM NEGATIVE RODS AEROBIC BOTTLE ONLY CRITICAL RESULT CALLED TO, READ BACK BY AND VERIFIED WITH: Coralee Rud PHARMD @1255  01/21/21 EB Performed at [facility], [place] [address], [place], [place] [address]    Culture AEROMONAS HYDROPHILA GROUP (A)  Final   Report Status 01/23/2021 FINAL  Final   Organism ID, Bacteria AEROMONAS  HYDROPHILA GROUP  Final      Susceptibility   Aeromonas hydrophila group - MIC*    CEFAZOLIN 32 INTERMEDIATE  Intermediate     CEFTAZIDIME <=1 SENSITIVE Sensitive     CIPROFLOXACIN <=0.25 SENSITIVE Sensitive     GENTAMICIN <=1 SENSITIVE Sensitive     IMIPENEM >=16 RESISTANT Resistant     TRIMETH/SULFA <=20 SENSITIVE Sensitive     * AEROMONAS HYDROPHILA GROUP  SARS Coronavirus 2 by RT PCR (hospital order, performed in [place] hospital lab) Nasopharyngeal Nasopharyngeal Swab     Status: None   Collection Time: 01/31/2021  5:43 PM   Specimen: Nasopharyngeal Swab  Result Value Ref Range Status   SARS Coronavirus 2 NEGATIVE NEGATIVE Final    Comment: (NOTE) SARS-CoV-2 target nucleic acids are NOT DETECTED.  The SARS-CoV-2 RNA is generally detectable in upper and lower respiratory specimens during the acute phase of infection. The lowest concentration of SARS-CoV-2 viral copies this assay can detect is 250 copies / mL. A negative result does not preclude SARS-CoV-2 infection and should not be used as the sole basis for treatment or other patient management decisions.  A negative result may occur with improper specimen collection / handling, submission of specimen other than nasopharyngeal swab, presence of viral mutation(s) within the areas targeted by this assay, and inadequate number of viral copies (<250 copies / mL). A negative result must be combined with clinical observations, patient history, and epidemiological information.  Fact Sheet for Patients:   StrictlyIdeas.no  Fact Sheet for Healthcare Providers: BankingDealers.co.za  This test is not yet approved or  cleared by the [place] FDA and has been authorized for detection and/or diagnosis of SARS-CoV-2 by FDA under an Emergency Use Authorization (EUA).  This EUA will remain in effect (meaning this test can be used) for the duration of the COVID-19 declaration under Section 564(b)(1) of the Act, 21 U.S.C. section 360bbb-3(b)(1), unless the authorization is terminated or revoked  sooner.  Performed at [facility], [place] [address], [place], [place] [address]   MRSA PCR Screening     Status: None   Collection Time: 01/19/2021 11:52 PM   Specimen: Nasopharyngeal  Result Value Ref Range Status   MRSA by PCR NEGATIVE NEGATIVE Final    Comment:        The GeneXpert MRSA Assay (FDA approved for NASAL specimens only), is one component of a comprehensive MRSA colonization surveillance program. It is not intended to diagnose MRSA infection nor to guide or monitor treatment for MRSA infections. Performed at [facility], [place] [address], [place], [place] [address]          Radiology Studies: No results found.    Scheduled Meds: . Chlorhexidine Gluconate Cloth  6 each Topical Daily  . folic acid  1 mg Intravenous Daily  . gabapentin  300 mg Oral Daily  . pantoprazole  40 mg Oral BID AC  . polyethylene glycol  17 g Oral Daily  . sodium chloride flush  3 mL Intravenous Q12H   Continuous Infusions: . cefTAZidime (FORTAZ)  IV 2 g (01/25/21 0519)     LOS: 5 days      Debbe Odea, MD [facility] Pager: [email or web address] 01/25/2021, 12:20 PM

## 2021-01-25 NOTE — Plan of Care (Signed)
  Problem: Nutrition: Goal: Adequate nutrition will be maintained Outcome: Progressing   Problem: Pain Managment: Goal: General experience of comfort will improve Outcome: Progressing   Problem: Elimination: Goal: Will not experience complications related to bowel motility Outcome: Progressing Goal: Will not experience complications related to urinary retention Outcome: Progressing   

## 2021-01-26 ENCOUNTER — Inpatient Hospital Stay (HOSPITAL_COMMUNITY): Payer: Medicare Other

## 2021-01-26 DIAGNOSIS — R109 Unspecified abdominal pain: Secondary | ICD-10-CM

## 2021-01-26 DIAGNOSIS — K7210 Chronic hepatic failure without coma: Secondary | ICD-10-CM

## 2021-01-26 DIAGNOSIS — R52 Pain, unspecified: Secondary | ICD-10-CM

## 2021-01-26 DIAGNOSIS — L03119 Cellulitis of unspecified part of limb: Secondary | ICD-10-CM | POA: Diagnosis not present

## 2021-01-26 DIAGNOSIS — I70229 Atherosclerosis of native arteries of extremities with rest pain, unspecified extremity: Secondary | ICD-10-CM | POA: Diagnosis not present

## 2021-01-26 DIAGNOSIS — N179 Acute kidney failure, unspecified: Secondary | ICD-10-CM | POA: Diagnosis not present

## 2021-01-26 HISTORY — PX: IR PARACENTESIS: IMG2679

## 2021-01-26 LAB — BASIC METABOLIC PANEL
Anion gap: 15 (ref 5–15)
BUN: 39 mg/dL — ABNORMAL HIGH (ref 8–23)
CO2: 16 mmol/L — ABNORMAL LOW (ref 22–32)
Calcium: 8.9 mg/dL (ref 8.9–10.3)
Chloride: 106 mmol/L (ref 98–111)
Creatinine, Ser: 1.21 mg/dL (ref 0.61–1.24)
GFR, Estimated: >60 mL/min (ref >60)
Glucose, Bld: 76 mg/dL (ref 70–99)
Potassium: 5.2 mmol/L — ABNORMAL HIGH (ref 3.5–5.1)
Sodium: 137 mmol/L (ref 135–145)

## 2021-01-26 LAB — CBC
HCT: 38.9 % — ABNORMAL LOW (ref 39.0–52.0)
Hemoglobin: 12.4 g/dL — ABNORMAL LOW (ref 13.0–17.0)
MCH: 32.4 pg (ref 26.0–34.0)
MCHC: 31.9 g/dL (ref 30.0–36.0)
MCV: 101.6 fL — ABNORMAL HIGH (ref 80.0–100.0)
Platelets: 40 10*3/uL — ABNORMAL LOW (ref 150–400)
RBC: 3.83 MIL/uL — ABNORMAL LOW (ref 4.22–5.81)
RDW: 18.3 % — ABNORMAL HIGH (ref 11.5–15.5)
WBC: 18.1 10*3/uL — ABNORMAL HIGH (ref 4.0–10.5)
nRBC: 0 % (ref 0.0–0.2)

## 2021-01-26 MED ORDER — DIPHENHYDRAMINE HCL 50 MG/ML IJ SOLN: 12.5000 mg | Freq: Three times a day (TID) | INTRAMUSCULAR | Status: DC | PRN

## 2021-01-26 MED ORDER — GLYCOPYRROLATE 0.2 MG/ML IJ SOLN
0.2000 mg | INTRAMUSCULAR | Status: DC | PRN
  Filled 2021-01-26: qty 1

## 2021-01-26 MED ORDER — HYDROMORPHONE HCL 1 MG/ML IJ SOLN
1.0000 mg | Freq: Four times a day (QID) | INTRAMUSCULAR | Status: DC
  Administered 2021-01-26 – 2021-01-29 (×12): 1 mg via INTRAVENOUS
  Filled 2021-01-26 (×13): qty 1

## 2021-01-26 MED ORDER — HYDROMORPHONE HCL 1 MG/ML IJ SOLN
0.5000 mg | INTRAMUSCULAR | Status: DC | PRN
  Administered 2021-01-28: 1 mg via INTRAVENOUS
  Filled 2021-01-26: qty 1

## 2021-01-26 MED ORDER — LIDOCAINE HCL 1 % IJ SOLN
INTRAMUSCULAR | Status: AC
  Filled 2021-01-26: qty 20

## 2021-01-26 MED ORDER — LIDOCAINE HCL 1 % IJ SOLN
INTRAMUSCULAR | Status: DC | PRN
  Administered 2021-01-26: 15 mL

## 2021-01-26 MED ORDER — BISACODYL 10 MG RE SUPP: 10.0000 mg | Freq: Every day | RECTAL | Status: DC | PRN

## 2021-01-26 MED ORDER — SENNA 8.6 MG PO TABS: 1.0000 | ORAL_TABLET | Freq: Every day | ORAL | Status: DC | PRN

## 2021-01-26 MED ORDER — LORAZEPAM 2 MG/ML IJ SOLN
0.5000 mg | INTRAMUSCULAR | Status: DC | PRN
  Administered 2021-01-27: 0.5 mg via INTRAVENOUS
  Filled 2021-01-26: qty 1

## 2021-01-26 NOTE — Procedures (Signed)
PROCEDURE SUMMARY:  Successful image-guided paracentesis from the right lateral abdomen.  Yielded 4.8 liters of clear gold fluid.  No immediate complications.  EBL = 0 mL. Patient tolerated well.   Specimen was sent for labs.  Please see imaging section of Epic for full dictation.   Claris Pong [place] PA-C 01/26/2021 3:52 PM

## 2021-01-26 NOTE — Plan of Care (Signed)

## 2021-01-26 NOTE — Progress Notes (Signed)
PROGRESS NOTE    KASTEN LEVEQUE   QIH:[id number]  DOB: 1956/11/15  DOA: 01/22/2021 PCP: Azzie Glatter, FNP   Brief Narrative:  Shi Blankenship Warf 65 y.o.malewith history of alcoholic cirrhosis with HCC, hepatitis C, hypertension, PAD, recently diagnosed epiglottic cancer, who presents with left leg pain after a fall. He was found to have cellulitis and gangrene of the left leg.    Subjective: Awake alert complaining of abdominal distention with some pain shortness of breath.     Assessment & Plan:    Principal Problem:   Sepsis with Critical lower limb ischemia and cellulitis of left lower leg - Lactic acid 10 on admission  --- prognosis remain poor - noted to bave progression of infection in hospital with concerns for ischemia - Ortho and vascular surgery consulted - he was supposed to have an arteriogram as outpatient but this was unable to be completed - in hospital ABIs show PAD bilaterally- vascular surgery is suggesting he have an AKA Daughter has been informed that he is may not be able to make decision for himself, - - unfortunately, due to his severe livere cirrhosis, recurrent ascites, poor nutritional status, 2 underlying cancers and coagulopathic state (elevated INR and platelets ~ 30), he is a very poor candidate for surgery and may not recover from surgery enough to have a good quality of life - of note, his daughter states that her mother and the patient's brother are the POAs- palliative care will continue discussions about [place] -Appreciate palliative team follow-up and input    Active Problems: Cirrhosis with portal HTN, splenomegaly, ascites, thrombocytopenia, elevated INR/coagulopathy, h/o Hepatocellular cancer s/p ablation - required a paracentesis of 5 L of fluid on 01/18/2021 -  will resume lasix today (on hold for AKI)- cont to hold Aldactone -Worsening abdominal distention 01/26/2021 -therapeutic thoracentesis ordered  AKI - Cr 2.86 on 01/31/2021- ? If  due to diuretics- steadily improving with holding diuretics- IVF stopped yesterday as abdomen began to fill with fluid again - baseline Cr 0.8 >> 1.21 remained stable -Monitoring   Acute metabolic encephalopathy - per daughter, patient is usually not this confused at baseline - Ammonia level was only 37 on 2/5 and thus confusion may be due to acute infection -Monitor mental status, awake alert x2 this morning -Monitoring ammonia level    Malignant neoplasm of supraglottis  - followed by ENT, Dr Lucia Gaskins  - No surgery planned - referred to rad onc and med onc but has not had evaluations yet  Folic acid deficiency with macrocytic anemia -Folate level 1.9 -Continue folic acid supplements  Neuropathy -Continue gabapentin  Anxiety Continue alprazolam  Gastroesophageal reflux disease -Continue PPI  Hyperkalemia -Potassium of 5.2 >> no dysrhythmia monitoring closely -We will initiate Kayexalate     Time spent in minutes: 45 DVT prophylaxis: SCDs Code Status: Full code Family Communication: with daughter, [name] Level of Care: Level of care: Telemetry Medical Disposition Plan:  Status is: Inpatient  Remains inpatient appropriate because:IV treatments appropriate due to intensity of illness or inability to take PO  Awaiting family to make decision about amputation of leg vs comfort care   Dispo: The patient is from: Home              Anticipated d/c is to: TBD              Anticipated d/c date is: > 3 days              Patient currently is  not medically stable to d/c.   Difficult to place patient No  Consultants:   Orthopedic surgery  Vascular surgery  Palliative care  Procedures:   None Antimicrobials:  Anti-infectives (From admission, onward)   Start     Dose/Rate Route Frequency Ordered Stop   01/24/21 1100  cefTAZidime (FORTAZ) 2 g in sodium chloride 0.9 % 100 mL IVPB        2 g 200 mL/hr over 30 Minutes Intravenous Every 8 hours 01/24/21 1007      01/21/21 2200  ceFEPIme (MAXIPIME) 2 g in sodium chloride 0.9 % 100 mL IVPB  Status:  Discontinued        2 g 200 mL/hr over 30 Minutes Intravenous Every 12 hours 01/21/21 1109 01/24/21 1007   01/21/21 1800  vancomycin (VANCOCIN) IVPB 1000 mg/200 mL premix  Status:  Discontinued        1,000 mg 200 mL/hr over 60 Minutes Intravenous Every 24 hours 01/30/2021 2055 01/24/21 1007   01/21/21 1000  ceFEPIme (MAXIPIME) 2 g in sodium chloride 0.9 % 100 mL IVPB  Status:  Discontinued        2 g 200 mL/hr over 30 Minutes Intravenous Every 24 hours 02/03/2021 2050 01/21/21 1109   01/21/21 0600  ceFAZolin (ANCEF) IVPB 2g/100 mL premix  Status:  Discontinued        2 g 200 mL/hr over 30 Minutes Intravenous On call to O.R. 01/21/21 0009 01/21/21 0145   01/30/2021 1645  vancomycin (VANCOREADY) IVPB 2000 mg/400 mL        2,000 mg 200 mL/hr over 120 Minutes Intravenous  Once 02/10/2021 1638 01/28/2021 1935   01/17/2021 1615  ceFEPIme (MAXIPIME) 2 g in sodium chloride 0.9 % 100 mL IVPB        2 g 200 mL/hr over 30 Minutes Intravenous  Once 01/26/2021 1612 01/26/2021 1736   02/03/2021 1615  vancomycin (VANCOCIN) IVPB 1000 mg/200 mL premix  Status:  Discontinued        1,000 mg 200 mL/hr over 60 Minutes Intravenous  Once 02/07/2021 1612 02/01/2021 1637       Objective: Vitals:   01/25/21 1446 01/25/21 1930 01/26/21 0231 01/26/21 0755  BP: (!) 158/80 131/70 (!) 145/69 (!) 153/79  Pulse: 97 96 99 97  Resp: 16 17 18 16   Temp: 98.7 F (37.1 C) (!) 97.5 F (36.4 C) 98.5 F (36.9 C) (!) 97.5 F (36.4 C)  TempSrc:  Oral Oral Oral  SpO2: 97% 98% 98% 94%  Weight:      Height:       No intake or output data in the 24 hours ending 01/26/21 0850 Filed Weights   02/07/2021 2034 01/21/21 0004  Weight: 86.2 kg 83.2 kg      Physical Exam:   General:  Alert, oriented, cooperative, no distress;   HEENT:  Normocephalic, PERRL, otherwise with in Normal limits   Neuro:  CNII-XII intact. , normal motor and sensation, reflexes  intact   Lungs:   Clear to auscultation BL, Respirations unlabored, no wheezes / crackles  Cardio:    S1/S2, RRR, No murmure, No Rubs or Gallops   Abdomen:   Soft, non-tender, bowel sounds active all four quadrants,  no guarding or peritoneal signs.  Muscular skeletal:  Limited exam - in bed, able to move all 3  extremities, severe generalized weaknesses -  2+ pulses,  symmetric, pitting edema  Skin:  Dry, warm to touch, negative for any Rashes, edema of left leg with redness and  sloughing of skin.  Wounds: Please see nursing documentation -left lower extremity gangrene-dressing in place         Data Reviewed: I have personally reviewed following labs and imaging studies  CBC: Recent Labs  Lab 01/22/2021 1509 01/21/21 0228 01/22/21 0216 01/23/21 0533 01/24/21 0256 01/25/21 0118 01/26/21 0106  WBC 14.4*   < > 10.2 14.5* 20.1* 15.2* 18.1*  NEUTROABS 13.0*  --   --   --   --   --   --   HGB 12.2*   < > 9.2* 11.4* 13.1 12.0* 12.4*  HCT 38.4*   < > 28.9* 35.3* 41.2 37.1* 38.9*  MCV 106.1*   < > 104.3* 103.8* 103.0* 98.9 101.6*  PLT 48*   < > 36* 33* 34* 34* 40*   < > = values in this interval not displayed.   Basic Metabolic Panel: Recent Labs  Lab 01/22/21 0216 01/23/21 0533 01/24/21 0256 01/25/21 0118 01/26/21 0106  NA 134* 135 135 134* 137  K 5.2* 4.5 5.0 4.8 5.2*  CL 103 106 108 104 106  CO2 19* 20* 19* 19* 16*  GLUCOSE 109* 121* 124* 78 76  BUN 47* 47* 41* 39* 39*  CREATININE 2.16* 1.66* 1.43* 1.20 1.21  CALCIUM 8.4* 8.5* 8.7* 8.5* 8.9   GFR: Estimated Creatinine Clearance: 59.9 mL/min (by C-G formula based on SCr of 1.21 mg/dL). Liver Function Tests: Recent Labs  Lab 01/30/2021 1509 01/21/21 0228  AST 74* 51*  ALT 42 24  ALKPHOS 151* 84  BILITOT 3.9* 3.2*  PROT 5.0* 5.0*  ALBUMIN 2.0* 3.1*   No results for input(s): LIPASE, AMYLASE in the last 168 hours. Recent Labs  Lab 01/28/2021 1806  AMMONIA 37*   Coagulation Profile: Recent Labs  Lab  02/02/2021 1509 01/21/21 0228 01/24/21 0256  INR 2.2* 2.9* 1.9*   Cardiac Enzymes: No results for input(s): CKTOTAL, CKMB, CKMBINDEX, TROPONINI in the last 168 hours. BNP (last 3 results) No results for input(s): PROBNP in the last 8760 hours. HbA1C: No results for input(s): HGBA1C in the last 72 hours. CBG: Recent Labs  Lab 01/21/21 0019 01/21/21 0112 01/21/21 0229 01/21/21 0350 01/23/21 1658  GLUCAP 100* 96 103* 106* 119*   Lipid Profile: No results for input(s): CHOL, HDL, LDLCALC, TRIG, CHOLHDL, LDLDIRECT in the last 72 hours. Thyroid Function Tests: No results for input(s): TSH, T4TOTAL, FREET4, T3FREE, THYROIDAB in the last 72 hours. Anemia Panel: No results for input(s): VITAMINB12, FOLATE, FERRITIN, TIBC, IRON, RETICCTPCT in the last 72 hours. Urine analysis:    Component Value Date/Time   COLORURINE AMBER (A) 01/24/2021 0650   APPEARANCEUR CLEAR 01/24/2021 0650   LABSPEC 1.019 01/24/2021 0650   PHURINE 5.0 01/24/2021 0650   GLUCOSEU NEGATIVE 01/24/2021 0650   HGBUR LARGE (A) 01/24/2021 0650   BILIRUBINUR NEGATIVE 01/24/2021 0650   BILIRUBINUR Negative 11/26/2019 1134   KETONESUR 5 (A) 01/24/2021 0650   PROTEINUR 30 (A) 01/24/2021 0650   UROBILINOGEN 0.2 11/26/2019 1134   UROBILINOGEN 0.2 12/17/2017 1129   NITRITE NEGATIVE 01/24/2021 0650   LEUKOCYTESUR NEGATIVE 01/24/2021 0650   Sepsis Labs: @LABRCNTIP (procalcitonin:4,lacticidven:4) ) Recent Results (from the past 240 hour(s))  Blood culture (routine single)     Status: Abnormal   Collection Time: 02/04/2021  3:09 PM   Specimen: BLOOD  Result Value Ref Range Status   Specimen Description   Final    BLOOD LEFT WRIST Performed at [facility] [address], [place], [place] [address]    Special  Requests   Final    BOTTLES DRAWN AEROBIC AND ANAEROBIC Blood Culture results may not be optimal due to an inadequate volume of blood received in culture bottles Performed at [facility] [address] [name], [place] [address]    Culture  Setup Time   Final    GRAM NEGATIVE RODS AEROBIC BOTTLE ONLY CRITICAL RESULT CALLED TO, READ BACK BY AND VERIFIED WITH: Coralee Rud PHARMD @1255  01/21/21 EB Performed at [facility], [place] [address] [place], [place] [address]    Culture AEROMONAS HYDROPHILA GROUP (A)  Final   Report Status 01/23/2021 FINAL  Final   Organism ID, Bacteria AEROMONAS HYDROPHILA GROUP  Final      Susceptibility   Aeromonas hydrophila group - MIC*    CEFAZOLIN 32 INTERMEDIATE Intermediate     CEFTAZIDIME <=1 SENSITIVE Sensitive     CIPROFLOXACIN <=0.25 SENSITIVE Sensitive     GENTAMICIN <=1 SENSITIVE Sensitive     IMIPENEM >=16 RESISTANT Resistant     TRIMETH/SULFA <=20 SENSITIVE Sensitive     * AEROMONAS HYDROPHILA GROUP  SARS Coronavirus 2 by RT PCR (hospital order, performed in [facility] lab) Nasopharyngeal Nasopharyngeal Swab     Status: None   Collection Time: 02/05/2021  5:43 PM   Specimen: Nasopharyngeal Swab  Result Value Ref Range Status   SARS Coronavirus 2 NEGATIVE NEGATIVE Final    Comment: (NOTE) SARS-CoV-2 target nucleic acids are NOT DETECTED.  The SARS-CoV-2 RNA is generally detectable in upper and lower respiratory specimens during the acute phase of infection. The lowest concentration of SARS-CoV-2 viral copies this assay can detect is 250 copies / mL. A negative result does not preclude SARS-CoV-2 infection and should not be used as the sole basis for treatment or other patient management decisions.  A negative result may occur with improper specimen collection / handling, submission of specimen other than nasopharyngeal swab, presence of viral mutation(s) within the areas targeted by this assay, and inadequate number of viral copies (<250 copies / mL). A negative result must be combined with clinical observations, patient history, and epidemiological information.  Fact Sheet for Patients:    StrictlyIdeas.no  Fact Sheet for Healthcare Providers: BankingDealers.co.za  This test is not yet approved or  cleared by the [place] FDA and has been authorized for detection and/or diagnosis of SARS-CoV-2 by FDA under an Emergency Use Authorization (EUA).  This EUA will remain in effect (meaning this test can be used) for the duration of the COVID-19 declaration under Section 564(b)(1) of the Act, 21 U.S.C. section 360bbb-3(b)(1), unless the authorization is terminated or revoked sooner.  Performed at [facility], [place] [address] [place], [place] [address]   MRSA PCR Screening     Status: None   Collection Time: 02/04/2021 11:52 PM   Specimen: Nasopharyngeal  Result Value Ref Range Status   MRSA by PCR NEGATIVE NEGATIVE Final    Comment:        The GeneXpert MRSA Assay (FDA approved for NASAL specimens only), is one component of a comprehensive MRSA colonization surveillance program. It is not intended to diagnose MRSA infection nor to guide or monitor treatment for MRSA infections. Performed at [facility], [place] [address], [place], [place] [address]   Urine culture     Status: None   Collection Time: 01/24/21  6:42 AM   Specimen: In/Out Cath Urine  Result Value Ref Range Status   Specimen Description IN/OUT CATH URINE  Final   Special Requests NONE  Final   Culture   Final    NO GROWTH Performed at [facility], [place] [address], [place], [place] [address]    Report Status 01/25/2021 FINAL  Final         Radiology Studies: No results found.    Scheduled Meds: . Chlorhexidine Gluconate Cloth  6 each Topical Daily  . folic acid  1 mg Intravenous Daily  . furosemide  40 mg Oral Daily  . gabapentin  300 mg Oral Daily  . pantoprazole  40 mg Oral BID AC  . polyethylene glycol  17 g Oral Daily  . sodium chloride flush  3 mL Intravenous Q12H   Continuous  Infusions: . cefTAZidime (FORTAZ)  IV 2 g (01/26/21 0518)     LOS: 6 days      Deatra James, MD [facility] Pager: [email or web address] 01/26/2021, 8:50 AM

## 2021-01-26 NOTE — Progress Notes (Signed)
Daily Progress Note   Patient Name: Jeffrey Snow       Date: 01/26/2021 DOB: 11/30/1956  Age: 65 y.o. MRN#: [id number] Attending Physician: Jeffrey James, MD Primary Care Physician: Jeffrey Glatter, FNP Admit Date: 02/05/2021  Reason for Consultation/Follow-up: Establishing goals of care  Subjective: Patient awake, alert, oriented but with some confusion and poor insight on his condition despite multiple explanations of his condition. Continues to have left lower extremity pain. Also tender/distended abdomen with plans for paracentesis today.   GOC:  Met with Jeffrey Snow, daughter Jeffrey Snow), and documented HCPOA Jeffrey Snow, patient's ex/Jeffrey Snow's mother) at bedside to discuss goals of care.   Discussed in detail course of hospitalization including diagnoses, interventions, plan of care, recommendations from specialists. Frankly and compassionately discussed poor prognosis whether amputation is pursued or not, with multiple underlying chronic conditions and cancer.  Patient fixates on someone this morning telling him that he is not dying and that he just has an infection. Family and I again explained in detail his condition and options including continued aggressive medical management including amputation and the need for nursing home placement following versus comfort focused care plan to focus on his symptom management and quality of life.   Patient continues to tell us he does not want to die. He also tells [place] he does not want [place] to take his leg. Patient asks if he will be able to walk or drive again and is surprised when we say it is very unlikely he will be able to do those things whether amputated or not. Patient does NOT wish to live in a nursing home.   Multiple attempts made to allow  patient to make decision. In conclusion, Jeffrey Snow and Jeffrey Snow share their opinions that they know he would never want to live in a nursing home and also feel it is inhumane to put him through amputation when he is a high risk surgical candidate and will die regardless of amputation or not.   Jeffrey Snow agree that NOT pursuing amputation and shifting towards comfort would be best for Jeffrey Snow. They do not wish to see him suffer. They wish for him to be well taken care of in a hospice facility. Unfortunately they cannot take him home in this condition.   Jeffrey Snow and Jeffrey Snow present his living will documentation. He  has clearly documented that he would not wish for prolonging measures if terminal or incurable and has a desire for a natural death.  Discussed shift to comfort focused care plan including emphasis on symptom management through this journey. Explained prognosis of likely weeks with high risk for recurrent sepsis from critical limb ischemia. Discussed hospice facility and philosophy. Explained that interventions not aimed at comfort will be discontinued. Jeffrey Snow and Jeffrey Snow understand and agree with plan for shift to comfort. Discussed unrestricted visitor access, comfort feeds, symptom medications.   Lastly, explained to Jeffrey Snow that we will not remove his leg and that time may be short but that he will not go to live in a nursing home and we will make sure his pain is managed. Discussed comfort feeds and visits with his family. Jeffrey Snow agrees with plan.   Answered all questions and concerns. PMT contact information and Hard Choices booklet given for review.   Updated RN and Dr. Roger Snow.    Length of Stay: 6  Current Medications: Scheduled Meds:  . Chlorhexidine Gluconate Cloth  6 each Topical Daily  . folic acid  1 mg Intravenous Daily  . furosemide  40 mg Oral Daily  . gabapentin  300 mg Oral Daily  . pantoprazole  40 mg Oral BID AC  . polyethylene glycol  17 g Oral Daily  . sodium  chloride flush  3 mL Intravenous Q12H    Continuous Infusions: . cefTAZidime (FORTAZ)  IV 2 g (01/26/21 0518)    PRN Meds: acetaminophen, albuterol, ALPRAZolam, HYDROcodone-acetaminophen, HYDROmorphone (DILAUDID) injection, ondansetron **OR** ondansetron (ZOFRAN) IV  Physical Exam Vitals and nursing note reviewed.  Constitutional:      General: He is awake.     Appearance: He is ill-appearing.  HENT:     Head: Normocephalic and atraumatic.  Cardiovascular:     Rate and Rhythm: Normal rate.  Pulmonary:     Effort: No tachypnea, accessory muscle usage or respiratory distress.  Abdominal:     General: There is distension.     Tenderness: There is abdominal tenderness.     Comments: Ascites. Paracentesis today  Skin:    General: Skin is warm and dry.     Comments: LL extremity dressing weeping.  Neurological:     Mental Status: He is alert and oriented to person, place, and time.     Comments: Intermittent confusion and poor insight on complexity of his condition.             Vital Signs: BP (!) 153/79 (BP Location: Left Arm)   Pulse 97   Temp (!) 97.5 F (36.4 C) (Oral)   Resp 16   Ht 5' 8.5" (1.74 m)   Wt 83.2 kg   SpO2 94%   BMI 27.48 kg/m  SpO2: SpO2: 94 % O2 Device: O2 Device: Room Air O2 Flow Rate: O2 Flow Rate (L/min): 2 L/min  Intake/output summary: No intake or output data in the 24 hours ending 01/26/21 1012 LBM: Last BM Date: 01/22/21 Baseline Weight: Weight: 86.2 kg Most recent weight: Weight: 83.2 kg       Palliative Assessment/Data: PPS 30%    Flowsheet Rows   Flowsheet Row Most Recent Value  Intake Tab   Referral Department Hospitalist  Unit at Time of Referral Med/Surg Unit  Palliative Care Primary Diagnosis Sepsis/Infectious Disease  Palliative Care Type New Palliative care  Reason for referral Clarify Goals of Care  Date first seen by Palliative Care 01/24/21  Clinical Assessment   Palliative Performance  Scale Score 30%  Psychosocial &  Spiritual Assessment   Palliative Care Outcomes   Patient/Family meeting held? Yes  Who was at the meeting? patient, daughter, also spoke with patient's ex/documented POA (Jeffrey Snow)  Palliative Care Outcomes Clarified goals of care, Counseled regarding hospice, Provided end of life care assistance, Provided psychosocial or spiritual support, Changed CPR status, ACP counseling assistance      Patient Active Problem List   Diagnosis Date Noted  . AKI (acute kidney injury) (HCC)   . Palliative care by specialist   . DNR (do not resuscitate)   . Critical lower limb ischemia (HCC)   . Liver failure without hepatic coma (HCC)   . Sepsis due to cellulitis (HCC) 01/28/2021  . Malignant neoplasm of supraglottis (HCC) 01/19/2021  . Traumatic rhabdomyolysis (HCC) 09/19/2020  . History of GI bleed 09/19/2020  . Rhabdomyolysis 09/19/2020  . Wheezing 11/28/2019  . Neuropathy 11/28/2019  . Anxiety 11/28/2019  . Acute upper GI bleed 09/05/2015  . Lactic acidosis 09/05/2015  . Hyperkalemia 09/05/2015  . Hemorrhagic shock (HCC) 09/05/2015  . Urinary tract infection, acute 08/31/2015  . Anemia, blood loss 12/16/2014  . Melena 12/16/2014  . Anemia 12/16/2014  . Abdominal pain, chronic, right upper quadrant 07/06/2014  . HCC (hepatocellular carcinoma) (HCC) 06/09/2014  . Other pancytopenia (HCC) 04/27/2014  . Cellulitis of leg 04/24/2014  . Elevated troponin 04/23/2014  . Normocytic anemia 04/21/2014  . Cirrhosis, alcoholic (HCC) 04/21/2014  . Hyponatremia 04/21/2014  . Hypokalemia 04/21/2014  . Metabolic acidosis 04/21/2014  . Hepatitis C 04/21/2014  . Abnormal MRI of abdomen, liver  04/21/2014  . Leg pain 04/21/2014  . Rash and nonspecific skin eruption 04/21/2014  . T wave inversion in EKG 04/21/2014  . Right foot pain 03/31/2014  . Hyperglycemia 03/03/2014  . Vitamin D deficiency disease 03/03/2014  . HTN (hypertension) 03/03/2014  . Elevated LFTs 03/03/2014  . Screening for colon  cancer 03/03/2014  . Goals of care, counseling/discussion 03/03/2014  . PVD (peripheral vascular disease) (HCC) 12/03/2013  . PAD (peripheral artery disease) (HCC) 10/26/2013  . Arterial insufficiency (HCC) 10/26/2013  . Fall 10/26/2013  . Depression 09/22/2013  . Insomnia 09/22/2013  . Claudication of left lower extremity (HCC)   . Edema 08/20/2013  . Pain in limb 08/20/2013  . Atherosclerosis of native arteries of the extremities with intermittent claudication 08/20/2013  . Thrombocytopenia (HCC) 10/12/2012    Palliative Care Assessment & Plan   Patient Profile: 64 y.o. male  with past medical history of alcoholic cirrhosis, hepatocellular carcinoma, hepatitis C, hypertension, PAD, recently diagnosed epiglottic cancer receiving radiation admitted on 02/04/2021 with left leg pain after a fall. Patient found to have cellulitis and left leg gangrene. Hospital admission for sepsis with critical lower limb ischemia, cellulitis LL leg with underlying cirrhosis with portal HTN, splenomegaly, ascites s/p paracentesis removed 5L, thrombocytopenia, coagulopathy. Vascular surgery and ortho following. Hospital ABI's reveal bilateral PAD. Vascular recommending AKA. Ortho, Dr. Duda feels patient is a poor surgical candidate with unlikelihood of AKA healing following surgery due to multiple underlying co-morbidities. Palliative medicine consultation for goals of care.   Assessment: Sepsis  Critical lower limb ischemia Cellulitis of LL leg Decompensated liver cirrhosis  Hx of portal HTN Splenomegaly Ascites s/p paracentesis Thrombocytopenia Coagulopathy Hx of hepatocellular carcinoma s/p ablation Recent epiglottic cancer PAD Acute metabolic encephalopathy AKI  Recommendations/Plan:  Patient's documented HCPOA, living will and desire for natural death copy placed in paper chart to be scanned into EMR.   Patient's primary HCPOA   is brother who is estranged and wants nothing to do with [place].  GOC discussion with patient's secondary HCPOA (Jeffrey Snow), patient, and his daughter (Jeffrey Snow).  NO amputation.  Family understands his condition and prognosis. Shift to comfort measures only. Discontinue interventions not aimed at comfort.   Comfort feeds per patient/family request.  PRN comfort meds added to MAR. Scheduled IV dilaudid for pain management.  Palliative paracentesis today for symptom management.  Unrestricted visitor access.  Wound care as needed when soiled.  TOC referral for residential hospice placement. Family prefers [facility] facility in [place].    Code Status: DNR/DNI   Code Status Orders  (From admission, onward)         Start     Ordered   01/25/21 1345  Do not attempt resuscitation (DNR)  Continuous       Question Answer Comment  In the event of cardiac or respiratory ARREST Do not call a "code blue"   In the event of cardiac or respiratory ARREST Do not perform Intubation, CPR, defibrillation or ACLS   In the event of cardiac or respiratory ARREST Use medication by any route, position, wound care, and other measures to relive pain and suffering. May use oxygen, suction and manual treatment of airway obstruction as needed for comfort.      01/25/21 1344        Code Status History    Date Active Date Inactive Code Status Order ID Comments User Context   01/21/2021 0008 01/25/2021 1344 Full Code [id number]  Eckstat, Matthew M, MD Inpatient   09/19/2020 1300 09/23/2020 1750 Full Code [id number]  Patel, Ekta V, MD ED   09/05/2015 1842 09/09/2015 1935 Full Code [id number]  Feliz Ortiz, Abraham, MD Inpatient   12/16/2014 1452 12/17/2014 1842 Full Code [id number]  Thompson, Daniel V, MD Inpatient   06/10/2014 1127 06/11/2014 1339 Full Code [id number]  McCullough, Heath, MD Inpatient   06/09/2014 1508 06/10/2014 1127 Full Code [id number]  McCullough, Heath, MD Inpatient   04/21/2014 1724 05/02/2014 1948 Full Code [id number]  Rama, Christina P, MD Inpatient    Advance Care Planning Activity    Advance Directive Documentation   Flowsheet Row Most Recent Value  Type of Advance Directive Healthcare Power of Attorney  Pre-existing out of facility DNR order (yellow form or pink MOST form) --  "MOST" Form in Place? --       Prognosis:  Weeks if not days  Discharge Planning:  Hospice facility  Care plan was discussed with RN, patient, daughter (Jeffrey Snow), ex/Jeffrey Snow's mother/HCPOA (Jeffrey Snow), updated Dr. Shahmedhdi  Thank you for allowing the Palliative Medicine Team to assist in the care of this patient.   Total Time 80 Prolonged Time Billed yes      Greater than 50%  of this time was spent counseling and coordinating care related to the above assessment and plan.  Megan Mason, DNP, FNP-C Palliative Medicine Team  Phone: [phone] Fax: [phone]  Please contact Palliative Medicine Team phone at [phone] for questions and concerns.      

## 2021-01-27 DIAGNOSIS — I70229 Atherosclerosis of native arteries of extremities with rest pain, unspecified extremity: Secondary | ICD-10-CM | POA: Diagnosis not present

## 2021-01-27 LAB — GRAM STAIN

## 2021-01-27 NOTE — Plan of Care (Signed)
  Problem: Education: Goal: Knowledge of General Education information will improve Description: Including pain rating scale, medication(s)/side effects and non-pharmacologic comfort measures 01/27/2021 0006 by Cephus Shelling, RN Outcome: Progressing 01/26/2021 2249 by Cephus Shelling, RN Outcome: Progressing   Problem: Health Behavior/Discharge Planning: Goal: Ability to manage health-related needs will improve 01/27/2021 0006 by Cephus Shelling, RN Outcome: Progressing 01/26/2021 2249 by Cephus Shelling, RN Outcome: Progressing   Problem: Activity: Goal: Risk for activity intolerance will decrease 01/27/2021 0006 by Cephus Shelling, RN Outcome: Progressing 01/26/2021 2249 by Cephus Shelling, RN Outcome: Progressing   Problem: Nutrition: Goal: Adequate nutrition will be maintained Outcome: Progressing   Problem: Coping: Goal: Level of anxiety will decrease Outcome: Progressing   Problem: Elimination: Goal: Will not experience complications related to bowel motility Outcome: Progressing   Problem: Pain Managment: Goal: General experience of comfort will improve Outcome: Progressing   Problem: Safety: Goal: Ability to remain free from injury will improve Outcome: Progressing   Problem: Skin Integrity: Goal: Risk for impaired skin integrity will decrease Outcome: Progressing

## 2021-01-27 NOTE — Progress Notes (Signed)
PROGRESS NOTE    Jeffrey Snow   SEG:[id number]  DOB: 07/06/1956  DOA: 02/11/2021 PCP: Azzie Glatter, FNP   Brief Narrative:  Jeffrey Snow 65 y.o.malewith history of alcoholic cirrhosis with HCC, hepatitis C, hypertension, PAD, recently diagnosed epiglottic cancer, who presents with left leg pain after a fall. He was found to have cellulitis and gangrene of the left leg.    Subjective: Patient is awake, confused, lethargic Seems comfortable    Assessment & Plan:    I have care team has met with the family, pursuing with comfort care measures, IV Dilaudid, Discontinue labs and meds  Principal Problem:   Sepsis with Critical lower limb ischemia and cellulitis of left lower leg - Lactic acid 10 on admission  --- prognosis remain poor - noted to bave progression of infection in hospital with concerns for ischemia - Ortho and vascular surgery consulted - he was supposed to have an arteriogram as outpatient but this was unable to be completed - in hospital ABIs show PAD bilaterally- vascular surgery is suggesting he have an AKA -Family was informed make a decision-confused, lethargic  - unfortunately, due to his severe livere cirrhosis, recurrent ascites, poor nutritional status, 2 underlying cancers and coagulopathic state (elevated INR and platelets ~ 30), he is a very poor candidate for surgery and may not recover from surgery enough to have a good quality of life   -Appreciate palliative care following --DNR/DNR-comfort care and -Palliative team has met with the family, agreed to proceed with comfort care measures    Active Problems: Cirrhosis with portal HTN, splenomegaly, ascites, thrombocytopenia, elevated INR/coagulopathy, h/o Hepatocellular cancer s/p ablation - required a paracentesis of 5 L of fluid on 01/18/2021 -Comfort care paracentesis yielding 4.8 L on 01/26/2021  -Discontinue Lasix and Aldactone -Abdominal distention-ascites mild improvement post  paracentesis  AKI - Cr 2.86 on 02/01/2021- ? If due to diuretics- steadily improving with holding diuretics- IVF stopped yesterday as abdomen began to fill with fluid again - baseline Cr 0.8 >> 1.21 remained stable --We will not monitor anymore   Acute metabolic encephalopathy -Patient is more confused, lethargic today 01/27/21 -Continue with comfort care measures - per daughter, patient is usually not this confused at baseline - Ammonia level was only 37 on 2/5 and thus confusion may be due to acute infection -Would not follow labs tomorrow morning sounds l    Malignant neoplasm of supraglottis  -Was followed by ENT, Dr Lucia Gaskins  - No surgery planned - referred to rad onc and med onc but has not had evaluations yet  Folic acid deficiency with macrocytic anemia -Folate level 1.9 -Continue folic acid supplements  Neuropathy -was on  gabapentin  Anxiety Continue alprazolam  Gastroesophageal reflux disease -Continue PPI  Hyperkalemia -Potassium of 5.2 >> no dysrhythmia monitoring closely -We will initiate Kayexalate     Time spent in minutes: 45 DVT prophylaxis: SCDs Code Status: Full code Family Communication: with daughter, [name] For palliative care:  Patient's primary HCPOA is brother who is estranged and wants nothing to do with [name]. [place] discussion with patient's secondary HCPOA [name]), patient, and his daughter [name]).  Family understands his condition and prognosis. Shift to comfort measures only. Discontinue interventions not aimed at comfort.   Comfort feeds per patient/family request.   Level of Care: Level of care: Telemetry Medical Disposition Plan:  Status is: Inpatient  Remains inpatient appropriate because:IV treatments appropriate due to intensity of illness or inability to take PO  Awaiting family to  make decision about amputation of leg vs comfort care   Dispo: The patient is from: Home              Anticipated d/c is to: Anticipating  eminent hospital death in 1 to 2 days                   Patient currently is not medically stable to d/c.   Difficult to place patient No  Consultants:   Orthopedic surgery  Vascular surgery  Palliative care  Procedures:   None Antimicrobials:  Anti-infectives (From admission, onward)   Start     Dose/Rate Route Frequency Ordered Stop   01/24/21 1100  cefTAZidime (FORTAZ) 2 g in sodium chloride 0.9 % 100 mL IVPB  Status:  Discontinued        2 g 200 mL/hr over 30 Minutes Intravenous Every 8 hours 01/24/21 1007 01/26/21 1113   01/21/21 2200  ceFEPIme (MAXIPIME) 2 g in sodium chloride 0.9 % 100 mL IVPB  Status:  Discontinued        2 g 200 mL/hr over 30 Minutes Intravenous Every 12 hours 01/21/21 1109 01/24/21 1007   01/21/21 1800  vancomycin (VANCOCIN) IVPB 1000 mg/200 mL premix  Status:  Discontinued        1,000 mg 200 mL/hr over 60 Minutes Intravenous Every 24 hours 02/07/2021 2055 01/24/21 1007   01/21/21 1000  ceFEPIme (MAXIPIME) 2 g in sodium chloride 0.9 % 100 mL IVPB  Status:  Discontinued        2 g 200 mL/hr over 30 Minutes Intravenous Every 24 hours 02/10/2021 2050 01/21/21 1109   01/21/21 0600  ceFAZolin (ANCEF) IVPB 2g/100 mL premix  Status:  Discontinued        2 g 200 mL/hr over 30 Minutes Intravenous On call to O.R. 01/21/21 0009 01/21/21 0145   02/04/2021 1645  vancomycin (VANCOREADY) IVPB 2000 mg/400 mL        2,000 mg 200 mL/hr over 120 Minutes Intravenous  Once 02/10/2021 1638 01/24/2021 1935   02/09/2021 1615  ceFEPIme (MAXIPIME) 2 g in sodium chloride 0.9 % 100 mL IVPB        2 g 200 mL/hr over 30 Minutes Intravenous  Once 02/10/2021 1612 02/01/2021 1736   01/25/2021 1615  vancomycin (VANCOCIN) IVPB 1000 mg/200 mL premix  Status:  Discontinued        1,000 mg 200 mL/hr over 60 Minutes Intravenous  Once 02/06/2021 1612 02/12/2021 1637       Objective: Vitals:   01/26/21 0755 01/26/21 1933 01/27/21 1000 01/27/21 1002  BP: (!) 153/79 128/66 125/77   Pulse: 97 90 100    Resp: 16 18 (!) 8 14  Temp: (!) 97.5 F (36.4 C) 98.4 F (36.9 C) 97.7 F (36.5 C)   TempSrc: Oral Oral Axillary   SpO2: 94% 92% (!) 86% 95%  Weight:      Height:       No intake or output data in the 24 hours ending 01/27/21 1144 Filed Weights   02/10/2021 2034 01/21/21 0004  Weight: 86.2 kg 83.2 kg       Physical Exam:   General:   Awake, confused, lethargic  HEENT:  Normocephalic, PERRL, otherwise with in Normal limits   Neuro:   Limited exam, confused CNII-XII intact. , normal motor and sensation, reflexes intact   Lungs:   Clear to auscultation BL, Respirations unlabored, no wheezes / crackles  Cardio:    S1/S2, RRR, No murmure, No  Rubs or Gallops   Abdomen:   Soft, mildly tender, improved distention-hypoactive bowel sounds-hepatomegaly-ascites with fluid shift   Muscular skeletal:  Limited exam -withdraws to pain-gangrene left lower extremity   Skin:   Dry, left lower extremity gangrene, dressing in place  Wounds: Please see nursing documentation-left lower extremity gangrene            Data Reviewed: I have personally reviewed following labs and imaging studies  CBC: Recent Labs  Lab 01/30/2021 1509 01/21/21 0228 01/22/21 0216 01/23/21 0533 01/24/21 0256 01/25/21 0118 01/26/21 0106  WBC 14.4*   < > 10.2 14.5* 20.1* 15.2* 18.1*  NEUTROABS 13.0*  --   --   --   --   --   --   HGB 12.2*   < > 9.2* 11.4* 13.1 12.0* 12.4*  HCT 38.4*   < > 28.9* 35.3* 41.2 37.1* 38.9*  MCV 106.1*   < > 104.3* 103.8* 103.0* 98.9 101.6*  PLT 48*   < > 36* 33* 34* 34* 40*   < > = values in this interval not displayed.   Basic Metabolic Panel: Recent Labs  Lab 01/22/21 0216 01/23/21 0533 01/24/21 0256 01/25/21 0118 01/26/21 0106  NA 134* 135 135 134* 137  K 5.2* 4.5 5.0 4.8 5.2*  CL 103 106 108 104 106  CO2 19* 20* 19* 19* 16*  GLUCOSE 109* 121* 124* 78 76  BUN 47* 47* 41* 39* 39*  CREATININE 2.16* 1.66* 1.43* 1.20 1.21  CALCIUM 8.4* 8.5* 8.7* 8.5* 8.9    GFR: Estimated Creatinine Clearance: 59.9 mL/min (by C-G formula based on SCr of 1.21 mg/dL). Liver Function Tests: Recent Labs  Lab 02/12/2021 1509 01/21/21 0228  AST 74* 51*  ALT 42 24  ALKPHOS 151* 84  BILITOT 3.9* 3.2*  PROT 5.0* 5.0*  ALBUMIN 2.0* 3.1*   No results for input(s): LIPASE, AMYLASE in the last 168 hours. Recent Labs  Lab 02/05/2021 1806  AMMONIA 37*   Coagulation Profile: Recent Labs  Lab 01/31/2021 1509 01/21/21 0228 01/24/21 0256  INR 2.2* 2.9* 1.9*   Cardiac Enzymes: No results for input(s): CKTOTAL, CKMB, CKMBINDEX, TROPONINI in the last 168 hours. BNP (last 3 results) No results for input(s): PROBNP in the last 8760 hours. HbA1C: No results for input(s): HGBA1C in the last 72 hours. CBG: Recent Labs  Lab 01/21/21 0019 01/21/21 0112 01/21/21 0229 01/21/21 0350 01/23/21 1658  GLUCAP 100* 96 103* 106* 119*   Lipid Profile: No results for input(s): CHOL, HDL, LDLCALC, TRIG, CHOLHDL, LDLDIRECT in the last 72 hours. Thyroid Function Tests: No results for input(s): TSH, T4TOTAL, FREET4, T3FREE, THYROIDAB in the last 72 hours. Anemia Panel: No results for input(s): VITAMINB12, FOLATE, FERRITIN, TIBC, IRON, RETICCTPCT in the last 72 hours. Urine analysis:    Component Value Date/Time   COLORURINE AMBER (A) 01/24/2021 0650   APPEARANCEUR CLEAR 01/24/2021 0650   LABSPEC 1.019 01/24/2021 0650   PHURINE 5.0 01/24/2021 0650   GLUCOSEU NEGATIVE 01/24/2021 0650   HGBUR LARGE (A) 01/24/2021 0650   BILIRUBINUR NEGATIVE 01/24/2021 0650   BILIRUBINUR Negative 11/26/2019 1134   KETONESUR 5 (A) 01/24/2021 0650   PROTEINUR 30 (A) 01/24/2021 0650   UROBILINOGEN 0.2 11/26/2019 1134   UROBILINOGEN 0.2 12/17/2017 1129   NITRITE NEGATIVE 01/24/2021 0650   LEUKOCYTESUR NEGATIVE 01/24/2021 0650   SRadiology Studies: IR Paracentesis  Result Date: 01/26/2021 INDICATION: Patient with history of HCC, alcoholic cirrhosis, hepatitis C, abdominal distension, and  recurrent ascites. Request is made for diagnostic  and therapeutic paracentesis up to 6 L. EXAM: ULTRASOUND GUIDED DIAGNOSTIC AND THERAPEUTIC PARACENTESIS MEDICATIONS: 10 mL 1% Lidocaine COMPLICATIONS: None immediate. PROCEDURE: Informed written consent was obtained from the patient after a discussion of the risks, benefits and alternatives to treatment. A timeout was performed prior to the initiation of the procedure. Initial ultrasound scanning demonstrates a large amount of ascites within the right lower abdominal quadrant. The right lower abdomen was prepped and draped in the usual sterile fashion. 1% lidocaine was used for local anesthesia. Following this, a 19 gauge, 7-cm, Yueh catheter was introduced. An ultrasound image was saved for documentation purposes. The paracentesis was performed. Approximately 1 L into procedure, catheter stopped draining. Limited abdominal US revealed catheter stuck on bowel loop with large amount of fluid in lower abdomen. Multiple attempts to dislodge bowel attempted without success. The catheter was removed. New site in lower abdomen marked and prepped in sterile fashion. 1% lidocaine was used for local anesthesia. Following this, a 6 Fr Safe-T-Centesis catheter was introduced. The paracentesis was preformed. The catheter was removed and a dressing was applied. The patient tolerated the procedure well without immediate post procedural complication. Patient received post-procedure intravenous albumin; see nursing notes for details. FINDINGS: A total of approximately 4.8 L of clear gold fluid was removed. Samples were sent to the laboratory as requested by the clinical team. IMPRESSION: Successful ultrasound-guided paracentesis yielding 4.8 L of peritoneal fluid. Read by: Earley Abide, PA-C Electronically Signed   By: Miachel Roux M.D.   On: 01/26/2021 16:20      Scheduled Meds: . furosemide  40 mg Oral Daily  . gabapentin  300 mg Oral Daily  .  HYDROmorphone (DILAUDID)  injection  1 mg Intravenous Q6H  . sodium chloride flush  3 mL Intravenous Q12H   Continuous Infusions:    LOS: 7 days      Deatra James, MD [facility] Pager: [email or web address] 01/27/2021, 11:44 AM

## 2021-01-28 DIAGNOSIS — K7290 Hepatic failure, unspecified without coma: Secondary | ICD-10-CM | POA: Diagnosis not present

## 2021-01-28 DIAGNOSIS — N179 Acute kidney failure, unspecified: Secondary | ICD-10-CM | POA: Diagnosis not present

## 2021-01-28 DIAGNOSIS — K117 Disturbances of salivary secretion: Secondary | ICD-10-CM

## 2021-01-28 DIAGNOSIS — L0390 Cellulitis, unspecified: Secondary | ICD-10-CM | POA: Diagnosis not present

## 2021-01-28 DIAGNOSIS — B182 Chronic viral hepatitis C: Secondary | ICD-10-CM

## 2021-01-28 DIAGNOSIS — I70229 Atherosclerosis of native arteries of extremities with rest pain, unspecified extremity: Secondary | ICD-10-CM | POA: Diagnosis not present

## 2021-01-28 MED ORDER — HYDROMORPHONE HCL 1 MG/ML IJ SOLN: 1.0000 mg | INTRAMUSCULAR | Status: DC | PRN

## 2021-01-28 MED ORDER — GLYCOPYRROLATE 0.2 MG/ML IJ SOLN
0.2000 mg | INTRAMUSCULAR | Status: DC
  Administered 2021-01-28 – 2021-01-29 (×7): 0.2 mg via INTRAVENOUS
  Filled 2021-01-28 (×12): qty 1

## 2021-01-28 MED ORDER — LORAZEPAM 2 MG/ML IJ SOLN: 1.0000 mg | INTRAMUSCULAR | Status: DC | PRN

## 2021-01-28 NOTE — Progress Notes (Signed)
Daily Progress Note   Patient Name: Jeffrey Snow       Date: 01/28/2021 DOB: [birth year]  Age: 65 y.o. MRN#: [id number] Attending Physician: Deatra James, MD Primary Care Physician: Azzie Glatter, FNP Admit Date: 01/16/2021  Reason for Consultation/Follow-up: Establishing goals of care  Subjective: Patient lethargic to sternal rub but appears very comfortable. Slow, shallow respirations with long periods of apnea. No facial grimacing or distress.   GOC:  No family at bedside. Spoke with daughter, Jeffrey Snow and provided updated that her father is actively dying. I do not recommend transfer out of the hospital. Discussed comfort focused care plan and medications. Prepared Jeffrey Snow for 'anything to happen at any time.' She understands and does plan to visit today. Reassured Jeffrey Snow that he looks very peaceful and comfortable this morning. Jeffrey Snow has PMT contact information and understands she can call with questions or concerns this afternoon.   Length of Stay: 8  Current Medications: Scheduled Meds:  . furosemide  40 mg Oral Daily  . gabapentin  300 mg Oral Daily  . glycopyrrolate  0.2 mg Intravenous Q4H  .  HYDROmorphone (DILAUDID) injection  1 mg Intravenous Q6H  . sodium chloride flush  3 mL Intravenous Q12H    Continuous Infusions:   PRN Meds: acetaminophen, albuterol, ALPRAZolam, bisacodyl, diphenhydrAMINE, glycopyrrolate, HYDROcodone-acetaminophen, HYDROmorphone (DILAUDID) injection, lidocaine, LORazepam, ondansetron **OR** ondansetron (ZOFRAN) IV, senna  Physical Exam Vitals and nursing note reviewed.  Constitutional:      Appearance: He is ill-appearing.     Comments: Actively dying  HENT:     Head: Normocephalic and atraumatic.  Cardiovascular:     Heart sounds:  Normal heart sounds.  Pulmonary:     Effort: No tachypnea, accessory muscle usage or respiratory distress.     Breath sounds: Rhonchi present.     Comments: Will schedule Jeffrey Snow. Shallow, slow respirations. Long periods of apnea. Comfortable. Abdominal:     General: There is distension.     Tenderness: There is abdominal tenderness.     Comments: Ascites. Paracentesis today  Skin:    General: Skin is warm and dry.     Findings: Ecchymosis present.     Comments: LL extremity dressing weeping.  Neurological:     Mental Status: He is lethargic.     Comments: Lethargic to sternal rub. Comfortable.  Vital Signs: BP 129/61 (BP Location: Left Arm)   Pulse 98   Temp (!) 97.5 F (36.4 C) (Oral)   Resp 13   Ht 5' 8.5" (1.74 m)   Wt 83.2 kg   SpO2 96%   BMI 27.48 kg/m  SpO2: SpO2: 96 % O2 Device: O2 Device: Nasal Cannula O2 Flow Rate: O2 Flow Rate (L/min): 2.5 L/min  Intake/output summary:   Intake/Output Summary (Last 24 hours) at 01/28/2021 0854 Last data filed at 01/28/2021 0500 Gross per 24 hour  Intake --  Output 1100 ml  Net -1100 ml   LBM: Last BM Date: 02/02/2021 Baseline Weight: Weight: 86.2 kg Most recent weight: Weight: 83.2 kg       Palliative Assessment/Data: PPS 10%    Flowsheet Rows   Flowsheet Row Most Recent Value  Intake Tab   Referral Department Hospitalist  Unit at Time of Referral Med/Surg Unit  Palliative Care Primary Diagnosis Sepsis/Infectious Disease  Palliative Care Type New Palliative care  Reason for referral Clarify Goals of Care  Date first seen by Palliative Care 01/24/21  Clinical Assessment   Palliative Performance Scale Score 10%  Psychosocial & Spiritual Assessment   Palliative Care Outcomes   Patient/Family meeting held? Yes  Who was at the meeting? daughter  Jeffrey Snow Provided end of life care assistance, Improved pain interventions, Improved non-pain symptom therapy, Provided psychosocial or spiritual  support      Patient Active Problem List   Diagnosis Date Noted  . Increased oropharyngeal secretions   . AKI (acute kidney injury) ([place])   . Palliative care by specialist   . DNR (do not resuscitate)   . Critical lower limb ischemia ([place])   . Liver failure without hepatic coma ([place])   . Sepsis due to cellulitis ([place]) 02/10/2021  . Malignant neoplasm of supraglottis ([place]) 01/19/2021  . Traumatic rhabdomyolysis ([place]) 09/19/2020  . History of GI bleed 09/19/2020  . Rhabdomyolysis 09/19/2020  . Wheezing 11/28/2019  . Neuropathy 11/28/2019  . Anxiety 11/28/2019  . Acute upper GI bleed 09/05/2015  . Lactic acidosis 09/05/2015  . Hyperkalemia 09/05/2015  . Hemorrhagic shock ([place]) 09/05/2015  . Urinary tract infection, acute 08/31/2015  . Anemia, blood loss 12/16/2014  . Melena 12/16/2014  . Anemia 12/16/2014  . Abdominal pain, chronic, right upper quadrant 07/06/2014  . [place] (hepatocellular carcinoma) ([place]) 06/09/2014  . Other pancytopenia ([place]) 04/27/2014  . Cellulitis of leg 04/24/2014  . Elevated troponin 04/23/2014  . Normocytic anemia 04/21/2014  . Cirrhosis, alcoholic ([place]) 06/10/9484  . Hyponatremia 04/21/2014  . Hypokalemia 04/21/2014  . Metabolic acidosis 46/27/0350  . Hepatitis C 04/21/2014  . Abnormal MRI of abdomen, liver  04/21/2014  . Leg pain 04/21/2014  . Rash and nonspecific skin eruption 04/21/2014  . T wave inversion in EKG 04/21/2014  . Right foot pain 03/31/2014  . Hyperglycemia 03/03/2014  . Vitamin D deficiency disease 03/03/2014  . HTN (hypertension) 03/03/2014  . Elevated LFTs 03/03/2014  . Screening for colon cancer 03/03/2014  . Terminal care 03/03/2014  . PVD (peripheral vascular disease) ([place]) 12/03/2013  . PAD (peripheral artery disease) ([place]) 10/26/2013  . Arterial insufficiency ([place]) 10/26/2013  . Fall 10/26/2013  . Depression 09/22/2013  . Insomnia 09/22/2013  . Claudication of left lower extremity ([place])   . Edema 08/20/2013  . Pain  in limb 08/20/2013  . Atherosclerosis of native arteries of the extremities with intermittent claudication 08/20/2013  . Thrombocytopenia ([place]) 10/12/2012    Palliative Care Assessment & Plan  Patient Profile: 65 y.o. male  with past medical history of alcoholic cirrhosis, hepatocellular carcinoma, hepatitis C, hypertension, PAD, recently diagnosed epiglottic cancer receiving radiation admitted on 01/27/2021 with left leg pain after a fall. Patient found to have cellulitis and left leg gangrene. Hospital admission for sepsis with critical lower limb ischemia, cellulitis LL leg with underlying cirrhosis with portal HTN, splenomegaly, ascites s/p paracentesis removed 5L, thrombocytopenia, coagulopathy. Vascular surgery and ortho following. Hospital ABI's reveal bilateral PAD. Vascular recommending AKA. Ortho, Dr. Sharol Given feels patient is a poor surgical candidate with unlikelihood of AKA healing following surgery due to multiple underlying co-morbidities. Palliative medicine consultation for goals of care.   Assessment: Sepsis  Critical lower limb ischemia Cellulitis of LL leg Decompensated liver cirrhosis  Hx of portal HTN Splenomegaly Ascites s/p paracentesis Thrombocytopenia Coagulopathy Hx of hepatocellular carcinoma s/p ablation Recent epiglottic cancer PAD Acute metabolic encephalopathy AKI  Recommendations/Plan:  Patient's documented HCPOA, living will and desire for natural death copy placed in paper chart to be scanned into EMR.   Patient's primary HCPOA is brother who is estranged and wants nothing to do with [place]. [place] discussion with patient's secondary HCPOA [name]), patient, and his daughter Jeffrey Snow).  NO amputation.  Family understands his condition and prognosis. Shift to comfort measures only on 01/26/21. Discontinue interventions not aimed at comfort.   Comfort feeds per patient/family request.  PRN comfort meds added to [facility]. Scheduled IV dilaudid for pain  management. Scheduled IV Jeffrey Snow for secretions.   Unrestricted visitor access.  Wound care as needed when soiled.  Patient actively dying. Unstable for transfer to hospice facility. Prepared daughter that he will likely pass in the hospital.  Code Status: DNR/DNI   Code Status Orders  (From admission, onward)         Start     Ordered   01/25/21 1345  Do not attempt resuscitation (DNR)  Continuous       Question Answer Comment  In the event of cardiac or respiratory ARREST Do not call a "code blue"   In the event of cardiac or respiratory ARREST Do not perform Intubation, CPR, defibrillation or ACLS   In the event of cardiac or respiratory ARREST Use medication by any route, position, wound care, and other measures to relive pain and suffering. May use oxygen, suction and manual treatment of airway obstruction as needed for comfort.      01/25/21 1344        Code Status History    Date Active Date Inactive Code Status Order ID Comments User Context   01/21/2021 0008 01/25/2021 1344 Full Code [id number]  Clarnce Flock, MD Inpatient   09/19/2020 1300 09/23/2020 1750 Full Code [id number]  Para Skeans, MD ED   09/05/2015 1842 09/09/2015 1935 Full Code [id number]  Charlynne Cousins, MD Inpatient   12/16/2014 1452 12/17/2014 1842 Full Code [id number]  Eugenie Filler, MD Inpatient   06/10/2014 1127 06/11/2014 1339 Full Code [id number]  Jacqulynn Cadet, MD Inpatient   06/09/2014 1508 06/10/2014 1127 Full Code [id number]  Jacqulynn Cadet, MD Inpatient   04/21/2014 1724 05/02/2014 1948 Full Code [id number]  Rama, Venetia Maxon, MD Inpatient   Advance Care Planning Activity    Advance Directive Documentation   Flowsheet Row Most Recent Value  Type of Advance Directive Healthcare Power of Attorney  Pre-existing out of facility DNR order (yellow form or pink MOST form) --  "MOST" Form in Place? --       Prognosis:  Days but possibly  hours.   Discharge Planning:  Anticipated Hospital  Death: unstable for transfer to hospice facility  Care plan was discussed with RN, daughter Jeffrey Snow)  Thank you for allowing the Palliative Medicine Team to assist in the care of this patient.   Total Time 20 Prolonged Time Billed no    Greater than 50% of this time was spent counseling and coordinating care related to the above assessment and plan.   Ihor Dow, DNP, FNP-C Palliative Medicine Team  Phone: [phone] Fax: [phone]  Please contact Palliative Medicine Team phone at [phone] for questions and concerns.

## 2021-01-28 NOTE — Plan of Care (Signed)

## 2021-01-28 NOTE — Progress Notes (Signed)
PROGRESS NOTE    Jeffrey Snow   WUJ:[id number]  DOB: 1956-03-19  DOA: 01/24/2021 PCP: Kallie Locks, FNP   Brief Narrative:  Jeffrey Snow 65 y.o.malewith history of alcoholic cirrhosis with HCC, hepatitis C, hypertension, PAD, recently diagnosed epiglottic cancer, who presents with left leg pain after a fall. He was found to have cellulitis and gangrene of the left leg.    Subjective: The patient was seen and examined, lethargic Seems to be comfortable laying in bed, abdomen very distended previous size    Assessment & Plan:    I have care team has met with the family, pursuing with comfort care measures, IV Dilaudid, Discontinue labs and meds  Principal Problem:   Sepsis with Critical lower limb ischemia and cellulitis of left lower leg -Under comfort care measures now  - Lactic acid 10 on admission  --- prognosis remain poor - noted to bave progression of infection in hospital with concerns for ischemia - Ortho and vascular surgery consulted - he was supposed to have an arteriogram as outpatient but this was unable to be completed - in hospital ABIs show PAD bilaterally- vascular surgery is suggesting he have an AKA -Family was informed make a decision-confused, lethargic  - unfortunately, due to his severe livere cirrhosis, recurrent ascites, poor nutritional status, 2 underlying cancers and coagulopathic state (elevated INR and platelets ~ 30), he is a very poor candidate for surgery and may not recover from surgery enough to have a good quality of life   -Appreciate palliative care following --DNR/DNR-comfort care and -Palliative team has met with the family, agreed to proceed with comfort care measures    Active Problems:  Cirrhosis with portal HTN, splenomegaly, ascites, thrombocytopenia, elevated INR/coagulopathy, h/o Hepatocellular cancer s/p ablation - required a paracentesis of 5 L of fluid on 01/18/2021 -Comfort care paracentesis yielding 4.8 L  on 01/26/2021  -Discontinue Lasix and Aldactone -Abdominal distention-redistended previous size  AKI - Cr 2.86 on 02/06/2021- - baseline Cr 0.8 >> 1.21--We will not monitor anymore   Acute metabolic encephalopathy -Progressively more confused, lethargic -Continue with comfort care measures -Encephalopathy likely due to elevated ammonia level, sepsis,     Malignant neoplasm of supraglottis  -Was followed by ENT, Dr Ezzard Standing  - No surgery planned - referred to rad onc and med onc but has not had evaluations   Folic acid deficiency with macrocytic anemia -Folate level 1.9 -Continue folic acid supplements if can tolerate p.o.  Neuropathy -was on  gabapentin  Anxiety Continue alprazolam  Gastroesophageal reflux disease -PPI  Hyperkalemia -Potassium of 5.2 >> no dysrhythmia monitoring closely -We will initiate Kayexalate     Time spent in minutes: 30 DVT prophylaxis: SCDs Code Status: Full code Family Communication: with daughter, [name] For palliative care:  Patient's primary HCPOA is brother who is estranged and wants nothing to do with [place]. GOC discussion with patient's secondary HCPOA [name]), patient, and his daughter [name]).  Family understands his condition and prognosis. Shift to comfort measures only. Discontinue interventions not aimed at comfort.   Comfort feeds per patient/family request.   Level of Care: Level of care: Telemetry Medical Disposition Plan:  Status is: Inpatient  Remains inpatient appropriate because:IV treatments appropriate due to intensity of illness or inability to take PO  Awaiting family to make decision about amputation of leg vs comfort care   Dispo: The patient is from: Home              Anticipated d/c is to:  Anticipating eminent hospital death in 1 to 2 days                   Patient currently is not medically stable to d/c.   Difficult to place patient No  Consultants:   Orthopedic surgery  Vascular  surgery  Palliative care  Procedures:   None Antimicrobials:  Anti-infectives (From admission, onward)   Start     Dose/Rate Route Frequency Ordered Stop   01/24/21 1100  cefTAZidime (FORTAZ) 2 g in sodium chloride 0.9 % 100 mL IVPB  Status:  Discontinued        2 g 200 mL/hr over 30 Minutes Intravenous Every 8 hours 01/24/21 1007 01/26/21 1113   01/21/21 2200  ceFEPIme (MAXIPIME) 2 g in sodium chloride 0.9 % 100 mL IVPB  Status:  Discontinued        2 g 200 mL/hr over 30 Minutes Intravenous Every 12 hours 01/21/21 1109 01/24/21 1007   01/21/21 1800  vancomycin (VANCOCIN) IVPB 1000 mg/200 mL premix  Status:  Discontinued        1,000 mg 200 mL/hr over 60 Minutes Intravenous Every 24 hours 01/16/2021 2055 01/24/21 1007   01/21/21 1000  ceFEPIme (MAXIPIME) 2 g in sodium chloride 0.9 % 100 mL IVPB  Status:  Discontinued        2 g 200 mL/hr over 30 Minutes Intravenous Every 24 hours 02/01/2021 2050 01/21/21 1109   01/21/21 0600  ceFAZolin (ANCEF) IVPB 2g/100 mL premix  Status:  Discontinued        2 g 200 mL/hr over 30 Minutes Intravenous On call to O.R. 01/21/21 0009 01/21/21 0145   02/06/2021 1645  vancomycin (VANCOREADY) IVPB 2000 mg/400 mL        2,000 mg 200 mL/hr over 120 Minutes Intravenous  Once 02/08/2021 1638 02/12/2021 1935   02/07/2021 1615  ceFEPIme (MAXIPIME) 2 g in sodium chloride 0.9 % 100 mL IVPB        2 g 200 mL/hr over 30 Minutes Intravenous  Once 01/22/2021 1612 01/28/2021 1736   01/22/2021 1615  vancomycin (VANCOCIN) IVPB 1000 mg/200 mL premix  Status:  Discontinued        1,000 mg 200 mL/hr over 60 Minutes Intravenous  Once 01/24/2021 1612 02/04/2021 1637       Objective: Vitals:   01/26/21 1933 01/27/21 1000 01/27/21 1002 01/28/21 0800  BP: 128/66 125/77  129/61  Pulse: 90 100  98  Resp: 18 (!) _0 Temp: 98.4 F (36.9 C) 97.7 F (36.5 C)  (!) 97.5 F (36.4 C)  TempSrc: Oral Axillary  Oral  SpO2: 92% (!) 86% 95% 96%  Weight:      Height:        Intake/Output  Summary (Last 24 hours) at 01/28/2021 1020 Last data filed at 01/28/2021 4034 Gross per 24 hour  Intake 3 ml  Output 1200 ml  Net -1197 ml   Filed Weights   01/23/2021 2034 01/21/21 0004  Weight: 86.2 kg 83.2 kg     Physical Exam:   General:   Lethargic  HEENT:  Normocephalic, PERRL, otherwise with in Normal limits   Neuro:   Lethargic Limited exam  Lungs:   Clear to auscultation BL, Respirations unlabored, no wheezes / crackles  Cardio:    S1/S2, RRR, No murmure, No Rubs or Gallops   Abdomen:    Much distended abdomen  soft, non-tender, positive fluid shift, hypoactive bowel sounds no guarding or peritoneal signs.  Muscular  skeletal:   Severe generalized weaknesses Limited exam - in bed, able to move all 4 extremities, Normal strength,  ,  symmetric, left leg ischemia  Skin:  Dry, warm to touch, negative for any Rashes, severe left leg ischemia  Wounds: Please see nursing documentation            Data Reviewed: I have personally reviewed following labs and imaging studies  CBC: Recent Labs  Lab 01/22/21 0216 01/23/21 0533 01/24/21 0256 01/25/21 0118 01/26/21 0106  WBC 10.2 14.5* 20.1* 15.2* 18.1*  HGB 9.2* 11.4* 13.1 12.0* 12.4*  HCT 28.9* 35.3* 41.2 37.1* 38.9*  MCV 104.3* 103.8* 103.0* 98.9 101.6*  PLT 36* 33* 34* 34* 40*   Basic Metabolic Panel: Recent Labs  Lab 01/22/21 0216 01/23/21 0533 01/24/21 0256 01/25/21 0118 01/26/21 0106  NA 134* 135 135 134* 137  K 5.2* 4.5 5.0 4.8 5.2*  CL 103 106 108 104 106  CO2 19* 20* 19* 19* 16*  GLUCOSE 109* 121* 124* 78 76  BUN 47* 47* 41* 39* 39*  CREATININE 2.16* 1.66* 1.43* 1.20 1.21  CALCIUM 8.4* 8.5* 8.7* 8.5* 8.9   GFR: Estimated Creatinine Clearance: 59.9 mL/min (by C-G formula based on SCr of 1.21 mg/dL). Liver Function Tests: No results for input(s): AST, ALT, ALKPHOS, BILITOT, PROT, ALBUMIN in the last 168 hours. No results for input(s): LIPASE, AMYLASE in the last 168 hours. No results for input(s):  AMMONIA in the last 168 hours. Coagulation Profile: Recent Labs  Lab 01/24/21 0256  INR 1.9*   Cardiac Enzymes: No results for input(s): CKTOTAL, CKMB, CKMBINDEX, TROPONINI in the last 168 hours. BNP (last 3 results) No results for input(s): PROBNP in the last 8760 hours. HbA1C: No results for input(s): HGBA1C in the last 72 hours. CBG: Recent Labs  Lab 01/23/21 1658  GLUCAP 119*   Lipid Profile: No results for input(s): CHOL, HDL, LDLCALC, TRIG, CHOLHDL, LDLDIRECT in the last 72 hours. Thyroid Function Tests: No results for input(s): TSH, T4TOTAL, FREET4, T3FREE, THYROIDAB in the last 72 hours. Anemia Panel: No results for input(s): VITAMINB12, FOLATE, FERRITIN, TIBC, IRON, RETICCTPCT in the last 72 hours. Urine analysis:    Component Value Date/Time   COLORURINE AMBER (A) 01/24/2021 0650   APPEARANCEUR CLEAR 01/24/2021 0650   LABSPEC 1.019 01/24/2021 0650   PHURINE 5.0 01/24/2021 0650   GLUCOSEU NEGATIVE 01/24/2021 0650   HGBUR LARGE (A) 01/24/2021 0650   BILIRUBINUR NEGATIVE 01/24/2021 0650   BILIRUBINUR Negative 11/26/2019 1134   KETONESUR 5 (A) 01/24/2021 0650   PROTEINUR 30 (A) 01/24/2021 0650   UROBILINOGEN 0.2 11/26/2019 1134   UROBILINOGEN 0.2 12/17/2017 1129   NITRITE NEGATIVE 01/24/2021 0650   LEUKOCYTESUR NEGATIVE 01/24/2021 0650   SRadiology Studies: IR Paracentesis  Result Date: 01/26/2021 INDICATION: Patient with history of HCC, alcoholic cirrhosis, hepatitis C, abdominal distension, and recurrent ascites. Request is made for diagnostic and therapeutic paracentesis up to 6 L. EXAM: ULTRASOUND GUIDED DIAGNOSTIC AND THERAPEUTIC PARACENTESIS MEDICATIONS: 10 mL 1% Lidocaine COMPLICATIONS: None immediate. PROCEDURE: Informed written consent was obtained from the patient after a discussion of the risks, benefits and alternatives to treatment. A timeout was performed prior to the initiation of the procedure. Initial ultrasound scanning demonstrates a large amount  of ascites within the right lower abdominal quadrant. The right lower abdomen was prepped and draped in the usual sterile fashion. 1% lidocaine was used for local anesthesia. Following this, a 19 gauge, 7-cm, Yueh catheter was introduced. An ultrasound image was saved for documentation  purposes. The paracentesis was performed. Approximately 1 L into procedure, catheter stopped draining. Limited abdominal US revealed catheter stuck on bowel loop with large amount of fluid in lower abdomen. Multiple attempts to dislodge bowel attempted without success. The catheter was removed. New site in lower abdomen marked and prepped in sterile fashion. 1% lidocaine was used for local anesthesia. Following this, a 6 Fr Safe-T-Centesis catheter was introduced. The paracentesis was preformed. The catheter was removed and a dressing was applied. The patient tolerated the procedure well without immediate post procedural complication. Patient received post-procedure intravenous albumin; see nursing notes for details. FINDINGS: A total of approximately 4.8 L of clear gold fluid was removed. Samples were sent to the laboratory as requested by the clinical team. IMPRESSION: Successful ultrasound-guided paracentesis yielding 4.8 L of peritoneal fluid. Read by: Earley Abide, PA-C Electronically Signed   By: Miachel Roux M.D.   On: 01/26/2021 16:20      Scheduled Meds: . glycopyrrolate  0.2 mg Intravenous Q4H  .  HYDROmorphone (DILAUDID) injection  1 mg Intravenous Q6H  . sodium chloride flush  3 mL Intravenous Q12H   Continuous Infusions:    LOS: 8 days      Deatra James, MD [facility] Pager: [email or web address] 01/28/2021, 10:20 AM

## 2021-01-28 NOTE — Plan of Care (Signed)
  Problem: Clinical Measurements: Goal: Ability to maintain clinical measurements within normal limits will improve Outcome: Progressing   Problem: Nutrition: Goal: Adequate nutrition will be maintained Outcome: Progressing   Problem: Coping: Goal: Level of anxiety will decrease Outcome: Progressing   Problem: Elimination: Goal: Will not experience complications related to bowel motility Outcome: Progressing   Problem: Pain Managment: Goal: General experience of comfort will improve Outcome: Progressing   Problem: Skin Integrity: Goal: Risk for impaired skin integrity will decrease Outcome: Progressing

## 2021-01-29 DIAGNOSIS — N179 Acute kidney failure, unspecified: Secondary | ICD-10-CM | POA: Diagnosis not present

## 2021-01-29 DIAGNOSIS — R1313 Dysphagia, pharyngeal phase: Secondary | ICD-10-CM

## 2021-01-29 DIAGNOSIS — L03119 Cellulitis of unspecified part of limb: Secondary | ICD-10-CM | POA: Diagnosis not present

## 2021-01-29 DIAGNOSIS — I70229 Atherosclerosis of native arteries of extremities with rest pain, unspecified extremity: Secondary | ICD-10-CM | POA: Diagnosis not present

## 2021-01-29 DIAGNOSIS — K7031 Alcoholic cirrhosis of liver with ascites: Secondary | ICD-10-CM | POA: Diagnosis not present

## 2021-01-29 MED ORDER — SCOPOLAMINE 1 MG/3DAYS TD PT72
1.0000 | MEDICATED_PATCH | TRANSDERMAL | Status: DC
  Administered 2021-01-29: 1.5 mg via TRANSDERMAL
  Filled 2021-01-29: qty 1

## 2021-01-30 LAB — CYTOLOGY - NON PAP

## 2021-01-31 ENCOUNTER — Ambulatory Visit: Payer: Medicare Other | Admitting: Radiation Oncology

## 2021-01-31 LAB — CULTURE, BODY FLUID W GRAM STAIN -BOTTLE: Culture: NO GROWTH

## 2021-02-01 ENCOUNTER — Ambulatory Visit: Payer: Medicare Other | Admitting: Hematology and Oncology

## 2021-02-01 ENCOUNTER — Other Ambulatory Visit: Payer: Medicare Other

## 2021-02-01 ENCOUNTER — Ambulatory Visit: Payer: Medicare Other

## 2021-02-02 ENCOUNTER — Ambulatory Visit: Payer: Medicare Other

## 2021-02-05 ENCOUNTER — Ambulatory Visit: Payer: Medicare Other

## 2021-02-06 ENCOUNTER — Ambulatory Visit: Payer: Medicare Other

## 2021-02-07 ENCOUNTER — Ambulatory Visit: Payer: Medicare Other

## 2021-02-08 ENCOUNTER — Ambulatory Visit: Payer: Medicare Other

## 2021-02-09 ENCOUNTER — Ambulatory Visit: Payer: Medicare Other

## 2021-02-12 ENCOUNTER — Ambulatory Visit: Payer: Medicare Other

## 2021-02-13 ENCOUNTER — Ambulatory Visit: Payer: Medicare Other

## 2021-02-13 NOTE — Discharge Summary (Signed)
Death Summary  [name] VZC:[id number] DOB: 1956/05/11 DOA: 02/19/2021  PCP: Jeffrey Glatter, FNP PCP/Office notified:   Admit date: 19-Feb-2021 Date of Death: March 01, 2021  Final Diagnoses:  Principal Problem:   Critical lower limb ischemia ([place]) Active Problems:   Thrombocytopenia (HCC)   Claudication of left lower extremity (HCC)   PAD (peripheral artery disease) (HCC)   HTN (hypertension)   Terminal care   Cirrhosis, alcoholic (HCC)   Hepatitis C   Cellulitis of leg   HCC (hepatocellular carcinoma) (HCC)   Lactic acidosis   Malignant neoplasm of supraglottis (HCC)   Sepsis due to cellulitis (HCC)   Liver failure without hepatic coma (HCC)   AKI (acute kidney injury) ([place])   Palliative care by specialist   DNR (do not resuscitate)   Increased oropharyngeal secretions    1. Sepsis secondary to cellulitis left lower extremity 2. Critical limb ischemia and cellulitis of left lower leg 3. Liver cirrhosis 4. Epiglottic cancer  History of present illness:  HPI: Jeffrey Snow is a 65 y.o. male with history of alcoholic cirrhosis with HCC, hepatitis C, hypertension, PAD, recently diagnosed epiglottic cancer, who presents with left leg pain after a fall.  Review of chart notable for recent diagnosis of epiglottic cancer after a biopsy late last month.  So radiation oncology earlier this week with plan for radiation treatment.  Last saw oncology on January 7, at that time reporting lower extremity swelling and erythema, picture in the chart at that time provides good comparison to photo taken today. Also had therapeutic paracentesis performed on 12/17/8784 without complication.   Patient challenging historian and also appears to be quite hard of hearing.  Frequently circular conversations had with patient regarding concerns about being moved to an assisted living facility and losing his apartment and possessions.  Through repeated questioning on my history patient reports  that earlier today he was getting up to go to answer the door to let his brother and using his walker when he fell and hit his left leg.  He denies to me and also the ED staff that he had any head strike.  Endorses having had some leg swelling but unable to say for how long.  In the ED initial vital signs notable for soft pressures running 80s to 90s over 50s, remainder of vital signs within normal limits.  Lab work-up notable for CMP showing AKI with creatinine at 2.8 above baseline of 0.8, anion gap of 16, albumin 2, AST 74, ALT 42, T bili 3.9 up from 2.9 on last check.  Troponin was normal, BNP elevated at 785 up from 51 3 months ago, INR at 2.2 elevated from 1.63 months ago, lactic acid markedly elevated at 10.  Covid test was negative.  Patient was started on sepsis protocol, given 2.5 L of lactated Ringer's, and also started on cefepime and vancomycin.  Repeat lactic acid was mildly downtrending to 9.6 on recheck.  Hospital Course:  65 y.o.malewith history of alcoholic cirrhosis with HCC, hepatitis C, hypertension, PAD, recently diagnosed epiglottic cancer, who presentED with left leg pain after a fall.  Patient was diagnosed with sepsis secondary to left lower extremity cellulitis.  He was started on antibiotics. Patient had worsening swelling of his leg. Ortho and vascular surgery consulted. he was supposed to have an arteriogram as outpatient but this was unable to be completed. in hospital ABIs show PAD bilaterally- vascular surgery suggested AKA. However, unfortunately, due to his severe livere cirrhosis, recurrent ascites, poor nutritional  status, 2 underlying cancers and coagulopathic state (elevated INR and platelets ~ 30), he is a very poor candidate for surgery and may not recover from surgery enough to have a good quality of life. Palliative care was consulted, patient was made DNR/DNI and subsequently transition to comfort care with agreement of the family. Subsequently he passed away at  [place] on Feb 14, 2021.  Cirrhosis with portal HTN, splenomegaly, ascites, thrombocytopenia, elevated INR/coagulopathy, h/o Hepatocellular cancer s/p ablation - required a paracentesis of 5 L of fluid on 01/18/2021 -Comfort care paracentesis yielding 4.8 L on 01/26/2021   AKI - Cr 2.86 on 01/19/2021- - baseline Cr 0.8 >> 3.46-   Acute metabolic encephalopathy -Progressively more confused, lethargic Was transition to comfort care    Malignant neoplasm of supraglottis  -Was followed by ENT, Dr Lucia Gaskins  - No surgery planned  Hyperkalemia -Potassium of 5.2 >> no dysrhythmia monitoring closely -We will initiate Kayexalate   Time: 20 min  Signed:  [name]  [facility] 01/30/2021, 2:48 PM

## 2021-02-13 NOTE — Progress Notes (Signed)
Pt pronounced by 2 RNs at [facility]. Pt's daughter informed and family declined to come visit at this time. MD made aware. Pt's wife will come pick up belongings tomorrow. Ring removed from pt's right hand and placed in belongings.

## 2021-02-13 NOTE — Progress Notes (Signed)
Daily Progress Note   Patient Name: Jeffrey Snow       Date: 02-25-21 DOB: 12-Aug-1956  Age: 65 y.o. MRN#: [id number] Attending Physician: Darliss Cheney, MD Primary Care Physician: Azzie Glatter, FNP Admit Date: 02/08/2021  Reason for Consultation/Follow-up: Establishing goals of care  Subjective: Patient lethargic to sternal rub but appears very comfortable. Slow, shallow respirations with long periods of apnea. No facial grimacing or distress.   GOC:  Daughter, [name] at bedside. Discussed comfort focused are plan and EOL expectations. Discussed symptom management medications, unrestricted visitor access, and removal of oxygen as to not prolong this process. Answered questions. Daughter has PMT contact information and understands to call with questions or concerns this afternoon.   Length of Stay: 9  Current Medications: Scheduled Meds:  . glycopyrrolate  0.2 mg Intravenous Q4H  .  HYDROmorphone (DILAUDID) injection  1 mg Intravenous Q6H  . scopolamine  1 patch Transdermal Q72H  . sodium chloride flush  3 mL Intravenous Q12H    Continuous Infusions:   PRN Meds: acetaminophen, albuterol, bisacodyl, diphenhydrAMINE, glycopyrrolate, HYDROmorphone (DILAUDID) injection, lidocaine, LORazepam, ondansetron **OR** ondansetron (ZOFRAN) IV  Physical Exam Vitals and nursing note reviewed.  Constitutional:      Appearance: He is ill-appearing.     Comments: Actively dying  HENT:     Head: Normocephalic and atraumatic.  Cardiovascular:     Heart sounds: Normal heart sounds.  Pulmonary:     Effort: No tachypnea, accessory muscle usage or respiratory distress.     Breath sounds: Rhonchi present.     Comments: Shallow, slow respirations. Long periods of apnea.  Comfortable. Abdominal:     General: There is distension.     Tenderness: There is no abdominal tenderness.  Skin:    General: Skin is warm and dry.     Findings: Ecchymosis present.     Comments: LL extremity dressing weeping.  Neurological:     Mental Status: He is lethargic.     Comments: Lethargic to sternal rub. Comfortable.            Vital Signs: BP (!) 99/54 (BP Location: Left Arm)   Pulse 100   Temp 98.3 F (36.8 C) (Oral)   Resp (!) 6   Ht 5' 8.5" (1.74 m)   Wt 83.2 kg   SpO2 90%  BMI 27.48 kg/m  SpO2: SpO2: 90 % O2 Device: O2 Device: Nasal Cannula O2 Flow Rate: O2 Flow Rate (L/min): 2 L/min  Intake/output summary:   Intake/Output Summary (Last 24 hours) at 02/27/2021 0931 Last data filed at Feb 27, 2021 0500 Gross per 24 hour  Intake 3 ml  Output 150 ml  Net -147 ml   LBM: Last BM Date: 01/28/2021 Baseline Weight: Weight: 86.2 kg Most recent weight: Weight: 83.2 kg       Palliative Assessment/Data: PPS 10%    Flowsheet Rows   Flowsheet Row Most Recent Value  Intake Tab   Referral Department Hospitalist  Unit at Time of Referral Med/Surg Unit  Palliative Care Primary Diagnosis Sepsis/Infectious Disease  Palliative Care Type New Palliative care  Reason for referral Clarify Goals of Care  Date first seen by Palliative Care 01/24/21  Clinical Assessment   Palliative Performance Scale Score 10%  Psychosocial & Spiritual Assessment   Palliative Care Outcomes   Patient/Family meeting held? Yes  Who was at the meeting? daughter  Myrtle Provided end of life care assistance, Improved pain interventions, Improved non-pain symptom therapy, Provided psychosocial or spiritual support      Patient Active Problem List   Diagnosis Date Noted  . Increased oropharyngeal secretions   . AKI (acute kidney injury) ([place])   . Palliative care by specialist   . DNR (do not resuscitate)   . Critical lower limb ischemia ([place])   . Liver failure without  hepatic coma ([place])   . Sepsis due to cellulitis ([place]) 01/23/2021  . Malignant neoplasm of supraglottis ([place]) 01/19/2021  . Traumatic rhabdomyolysis ([place]) 09/19/2020  . History of GI bleed 09/19/2020  . Rhabdomyolysis 09/19/2020  . Wheezing 11/28/2019  . Neuropathy 11/28/2019  . Anxiety 11/28/2019  . Acute upper GI bleed 09/05/2015  . Lactic acidosis 09/05/2015  . Hyperkalemia 09/05/2015  . Hemorrhagic shock ([place]) 09/05/2015  . Urinary tract infection, acute 08/31/2015  . Anemia, blood loss 12/16/2014  . Melena 12/16/2014  . Anemia 12/16/2014  . Abdominal pain, chronic, right upper quadrant 07/06/2014  . [place] (hepatocellular carcinoma) ([place]) 06/09/2014  . Other pancytopenia ([place]) 04/27/2014  . Cellulitis of leg 04/24/2014  . Elevated troponin 04/23/2014  . Normocytic anemia 04/21/2014  . Cirrhosis, alcoholic ([employer]) 64/33/2951  . Hyponatremia 04/21/2014  . Hypokalemia 04/21/2014  . Metabolic acidosis 88/41/6606  . Hepatitis C 04/21/2014  . Abnormal MRI of abdomen, liver  04/21/2014  . Leg pain 04/21/2014  . Rash and nonspecific skin eruption 04/21/2014  . T wave inversion in EKG 04/21/2014  . Right foot pain 03/31/2014  . Hyperglycemia 03/03/2014  . Vitamin D deficiency disease 03/03/2014  . HTN (hypertension) 03/03/2014  . Elevated LFTs 03/03/2014  . Screening for colon cancer 03/03/2014  . Terminal care 03/03/2014  . PVD (peripheral vascular disease) ([place]) 12/03/2013  . PAD (peripheral artery disease) ([place]) 10/26/2013  . Arterial insufficiency ([place]) 10/26/2013  . Fall 10/26/2013  . Depression 09/22/2013  . Insomnia 09/22/2013  . Claudication of left lower extremity ([place])   . Edema 08/20/2013  . Pain in limb 08/20/2013  . Atherosclerosis of native arteries of the extremities with intermittent claudication 08/20/2013  . Thrombocytopenia ([place]) 10/12/2012    Palliative Care Assessment & Plan   Patient Profile: 65 y.o. male  with past medical history of alcoholic  cirrhosis, hepatocellular carcinoma, hepatitis C, hypertension, PAD, recently diagnosed epiglottic cancer receiving radiation admitted on 01/26/2021 with left leg pain after a fall. Patient found to have cellulitis  and left leg gangrene. Hospital admission for sepsis with critical lower limb ischemia, cellulitis LL leg with underlying cirrhosis with portal HTN, splenomegaly, ascites s/p paracentesis removed 5L, thrombocytopenia, coagulopathy. Vascular surgery and ortho following. Hospital ABI's reveal bilateral PAD. Vascular recommending AKA. Ortho, Dr. Sharol Given feels patient is a poor surgical candidate with unlikelihood of AKA healing following surgery due to multiple underlying co-morbidities. Palliative medicine consultation for goals of care.   Assessment: Sepsis  Critical lower limb ischemia Cellulitis of LL leg Decompensated liver cirrhosis  Hx of portal HTN Splenomegaly Ascites s/p paracentesis Thrombocytopenia Coagulopathy Hx of hepatocellular carcinoma s/p ablation Recent epiglottic cancer PAD Acute metabolic encephalopathy AKI  Recommendations/Plan:  Patient's documented HCPOA, living will and desire for natural death copy placed in paper chart to be scanned into EMR.   Patient's primary HCPOA is brother who is estranged and wants nothing to do with [place]. [place] discussion with patient's secondary HCPOA [name]), patient, and his daughter [name]).  NO amputation.  Family understands his condition and prognosis. Shift to comfort measures only on 01/26/21. Discontinue interventions not aimed at comfort.   Comfort feeds per patient/family request.  PRN comfort meds added to [facility]. Scheduled IV dilaudid for pain management. Scheduled IV robinul for secretions. Added [place] TD.  Unrestricted visitor access.  Wound care as needed when soiled.  Patient actively dying. Unstable for transfer to hospice facility. Prepared daughter that he will likely pass in the hospital.  Code  Status: DNR/DNI   Code Status Orders  (From admission, onward)         Start     Ordered   01/25/21 1345  Do not attempt resuscitation (DNR)  Continuous       Question Answer Comment  In the event of cardiac or respiratory ARREST Do not call a "code blue"   In the event of cardiac or respiratory ARREST Do not perform Intubation, CPR, defibrillation or ACLS   In the event of cardiac or respiratory ARREST Use medication by any route, position, wound care, and other measures to relive pain and suffering. May use oxygen, suction and manual treatment of airway obstruction as needed for comfort.      01/25/21 1344        Code Status History    Date Active Date Inactive Code Status Order ID Comments User Context   01/21/2021 0008 01/25/2021 1344 Full Code [id number]  Clarnce Flock, MD Inpatient   09/19/2020 1300 09/23/2020 1750 Full Code [id number]  Para Skeans, MD ED   09/05/2015 1842 09/09/2015 1935 Full Code [id number]  Charlynne Cousins, MD Inpatient   12/16/2014 1452 12/17/2014 1842 Full Code [id number]  Eugenie Filler, MD Inpatient   06/10/2014 1127 06/11/2014 1339 Full Code [id number]  Jacqulynn Cadet, MD Inpatient   06/09/2014 1508 06/10/2014 1127 Full Code [id number]  Jacqulynn Cadet, MD Inpatient   04/21/2014 1724 05/02/2014 1948 Full Code [id number]  Rama, Venetia Maxon, MD Inpatient   Advance Care Planning Activity    Advance Directive Documentation   Flowsheet Row Most Recent Value  Type of Advance Directive Healthcare Power of Attorney  Pre-existing out of facility DNR order (yellow form or pink MOST form) --  "MOST" Form in Place? --       Prognosis:  Days but possibly hours.   Discharge Planning:  Anticipated Hospital Death: unstable for transfer to hospice facility  Care plan was discussed with RN, daughter [name])  Thank you for allowing the Palliative Medicine Team to assist in the care  of this patient.   Total Time 15 Prolonged Time Billed no    Greater than  50% of this time was spent counseling and coordinating care related to the above assessment and plan.   Ihor Dow, DNP, FNP-C Palliative Medicine Team  Phone: [phone] Fax: [phone]  Please contact Palliative Medicine Team phone at [phone] for questions and concerns.

## 2021-02-13 NOTE — Progress Notes (Signed)
Nutrition Brief Note  Chart reviewed. Pt now transitioning to comfort care.  No further nutrition interventions warranted at this time.  Please re-consult as needed.   Trea Carnegie W, RD, LDN, CDCES Registered Dietitian II Certified Diabetes Care and Education Specialist Please refer to AMION for RD and/or RD on-call/weekend/after hours pager  

## 2021-02-13 NOTE — Progress Notes (Addendum)
This chaplain responded to PMT consult for EOL spiritual care.  The Pt. daughter-Erica is at the bedside.  Jeffrey Snow prefers to wait on her mother for a spiritual care visit.  The chaplain brought Jeffrey Snow a cup of ice and will wait on F/U page.

## 2021-02-13 NOTE — Progress Notes (Signed)
PROGRESS NOTE    Jeffrey Snow   ALP:[id number]  DOB: 1956/06/07  DOA: 01/21/2021 PCP: Azzie Glatter, FNP   Brief Narrative:  Jeffrey Snow 65 y.o.malewith history of alcoholic cirrhosis with HCC, hepatitis C, hypertension, PAD, recently diagnosed epiglottic cancer, who presents with left leg pain after a fall. He was found to have cellulitis and gangrene of the left leg.    Subjective: Patient seen and examined.  Daughter at the bedside.  Patient having apneic breathing.  Looks comfortable.  Assessment & Plan:    I have care team has met with the family, pursuing with comfort care measures, IV Dilaudid, Discontinue labs and meds  Principal Problem:   Sepsis with Critical lower limb ischemia and cellulitis of left lower leg Cirrhosis with portal HTN, splenomegaly, ascites, thrombocytopenia, elevated INR/coagulopathy, h/o Hepatocellular cancer s/p ablatio AKI Acute metabolic encephalopathy Malignant neoplasm of supraglottis  Folic acid deficiency with macrocytic anemia Neuropathy Anxiety Gastroesophageal reflux disease Hyperkalemia  Transition to comfort care.  Having apneic breathing and actively dying.  Palliative care on board.  Appreciate their help.  Daughter at the bedside and has been updated by palliative care and by myself as well.  She is tearful but prepared.  Patient currently very comfortable.  Time spent in minutes: 28 DVT prophylaxis: SCDs Code Status: Full code Family Communication: with daughter, [name] For palliative care:  Patient's primary HCPOA is brother who is estranged and wants nothing to do with [name]. [name] discussion with patient's secondary HCPOA [name]), patient, and his daughter [name]).  Family understands his condition and prognosis. Shift to comfort measures only. Discontinue interventions not aimed at comfort.   Comfort feeds per patient/family request.   Level of Care: Level of care: Telemetry Medical Disposition  Plan:  Status is: Inpatient  Remains inpatient appropriate because: Anticipate hospital death  Dispo: The patient is from: Home              Anticipated d/c is to: Anticipate hospital death                    Patient currently is not medically stable to d/c.   Difficult to place patient No  Consultants:   Orthopedic surgery  Vascular surgery  Palliative care  Procedures:   None Antimicrobials:  Anti-infectives (From admission, onward)   Start     Dose/Rate Route Frequency Ordered Stop   01/24/21 1100  cefTAZidime (FORTAZ) 2 g in sodium chloride 0.9 % 100 mL IVPB  Status:  Discontinued        2 g 200 mL/hr over 30 Minutes Intravenous Every 8 hours 01/24/21 1007 01/26/21 1113   01/21/21 2200  ceFEPIme (MAXIPIME) 2 g in sodium chloride 0.9 % 100 mL IVPB  Status:  Discontinued        2 g 200 mL/hr over 30 Minutes Intravenous Every 12 hours 01/21/21 1109 01/24/21 1007   01/21/21 1800  vancomycin (VANCOCIN) IVPB 1000 mg/200 mL premix  Status:  Discontinued        1,000 mg 200 mL/hr over 60 Minutes Intravenous Every 24 hours 02/07/2021 2055 01/24/21 1007   01/21/21 1000  ceFEPIme (MAXIPIME) 2 g in sodium chloride 0.9 % 100 mL IVPB  Status:  Discontinued        2 g 200 mL/hr over 30 Minutes Intravenous Every 24 hours 01/28/2021 2050 01/21/21 1109   01/21/21 0600  ceFAZolin (ANCEF) IVPB 2g/100 mL premix  Status:  Discontinued  2 g 200 mL/hr over 30 Minutes Intravenous On call to O.R. 01/21/21 0009 01/21/21 0145   02/08/2021 1645  vancomycin (VANCOREADY) IVPB 2000 mg/400 mL        2,000 mg 200 mL/hr over 120 Minutes Intravenous  Once 02/10/2021 1638 02/10/2021 1935   01/19/2021 1615  ceFEPIme (MAXIPIME) 2 g in sodium chloride 0.9 % 100 mL IVPB        2 g 200 mL/hr over 30 Minutes Intravenous  Once 02/07/2021 1612 01/21/2021 1736   01/19/2021 1615  vancomycin (VANCOCIN) IVPB 1000 mg/200 mL premix  Status:  Discontinued        1,000 mg 200 mL/hr over 60 Minutes Intravenous  Once 01/19/2021 1612  01/19/2021 1637       Objective: Vitals:   01/27/21 1000 01/27/21 1002 01/28/21 0800 02/13/21 0631  BP: 125/77  129/61 (!) 99/54  Pulse: 100  98 100  Resp: (!) '8 14 13 ' (!) 6  Temp: 97.7 F (36.5 C)  (!) 97.5 F (36.4 C) 98.3 F (36.8 C)  TempSrc: Axillary  Oral Oral  SpO2: (!) 86% 95% 96% 90%  Weight:      Height:        Intake/Output Summary (Last 24 hours) at 02-13-21 1403 Last data filed at 2021-02-13 0500 Gross per 24 hour  Intake --  Output 50 ml  Net -50 ml   Filed Weights   02/01/2021 2034 01/21/21 0004  Weight: 86.2 kg 83.2 kg   General exam: Appears calm and comfortable  Respiratory system: Having shallow apneic breathing Cardiovascular system: S1 & S2 heard, RRR. No JVD, murmurs, rubs, gallops or clicks. No pedal edema. Gastrointestinal system: Abdomen is nondistended, soft and nontender. No organomegaly or masses felt. Normal bowel sounds heard.    Data Reviewed: I have personally reviewed following labs and imaging studies  CBC: Recent Labs  Lab 01/23/21 0533 01/24/21 0256 01/25/21 0118 01/26/21 0106  WBC 14.5* 20.1* 15.2* 18.1*  HGB 11.4* 13.1 12.0* 12.4*  HCT 35.3* 41.2 37.1* 38.9*  MCV 103.8* 103.0* 98.9 101.6*  PLT 33* 34* 34* 40*   Basic Metabolic Panel: Recent Labs  Lab 01/23/21 0533 01/24/21 0256 01/25/21 0118 01/26/21 0106  NA 135 135 134* 137  K 4.5 5.0 4.8 5.2*  CL 106 108 104 106  CO2 20* 19* 19* 16*  GLUCOSE 121* 124* 78 76  BUN 47* 41* 39* 39*  CREATININE 1.66* 1.43* 1.20 1.21  CALCIUM 8.5* 8.7* 8.5* 8.9   GFR: Estimated Creatinine Clearance: 59.9 mL/min (by C-G formula based on SCr of 1.21 mg/dL). Liver Function Tests: No results for input(s): AST, ALT, ALKPHOS, BILITOT, PROT, ALBUMIN in the last 168 hours. No results for input(s): LIPASE, AMYLASE in the last 168 hours. No results for input(s): AMMONIA in the last 168 hours. Coagulation Profile: Recent Labs  Lab 01/24/21 0256  INR 1.9*   Cardiac Enzymes: No  results for input(s): CKTOTAL, CKMB, CKMBINDEX, TROPONINI in the last 168 hours. BNP (last 3 results) No results for input(s): PROBNP in the last 8760 hours. HbA1C: No results for input(s): HGBA1C in the last 72 hours. CBG: Recent Labs  Lab 01/23/21 1658  GLUCAP 119*   Lipid Profile: No results for input(s): CHOL, HDL, LDLCALC, TRIG, CHOLHDL, LDLDIRECT in the last 72 hours. Thyroid Function Tests: No results for input(s): TSH, T4TOTAL, FREET4, T3FREE, THYROIDAB in the last 72 hours. Anemia Panel: No results for input(s): VITAMINB12, FOLATE, FERRITIN, TIBC, IRON, RETICCTPCT in the last 72 hours. Urine analysis:  Component Value Date/Time   COLORURINE AMBER (A) 01/24/2021 0650   APPEARANCEUR CLEAR 01/24/2021 0650   LABSPEC 1.019 01/24/2021 0650   PHURINE 5.0 01/24/2021 0650   GLUCOSEU NEGATIVE 01/24/2021 0650   HGBUR LARGE (A) 01/24/2021 0650   BILIRUBINUR NEGATIVE 01/24/2021 0650   BILIRUBINUR Negative 11/26/2019 1134   KETONESUR 5 (A) 01/24/2021 0650   PROTEINUR 30 (A) 01/24/2021 0650   UROBILINOGEN 0.2 11/26/2019 1134   UROBILINOGEN 0.2 12/17/2017 1129   NITRITE NEGATIVE 01/24/2021 0650   LEUKOCYTESUR NEGATIVE 01/24/2021 0650   SRadiology Studies: No results found.    Scheduled Meds: . glycopyrrolate  0.2 mg Intravenous Q4H  .  HYDROmorphone (DILAUDID) injection  1 mg Intravenous Q6H  . scopolamine  1 patch Transdermal Q72H  . sodium chloride flush  3 mL Intravenous Q12H   Continuous Infusions:    LOS: 9 days    Darliss Cheney, MD [facility] Pager: [email or web address] 2021/02/11, 2:03 PM

## 2021-02-13 DEATH — deceased

## 2021-02-14 ENCOUNTER — Ambulatory Visit: Payer: Medicare Other

## 2021-02-15 ENCOUNTER — Ambulatory Visit: Payer: Medicare Other

## 2021-02-16 ENCOUNTER — Ambulatory Visit: Payer: Medicare Other

## 2021-02-19 ENCOUNTER — Ambulatory Visit: Payer: Medicare Other

## 2021-02-20 ENCOUNTER — Ambulatory Visit: Payer: Medicare Other

## 2021-02-21 ENCOUNTER — Ambulatory Visit: Payer: Medicare Other

## 2021-02-22 ENCOUNTER — Ambulatory Visit: Payer: Medicare Other

## 2021-02-23 ENCOUNTER — Ambulatory Visit: Payer: Medicare Other

## 2021-02-26 ENCOUNTER — Ambulatory Visit: Payer: Medicare Other

## 2021-02-27 ENCOUNTER — Ambulatory Visit: Payer: Medicare Other

## 2021-03-30 ENCOUNTER — Other Ambulatory Visit: Payer: Medicare Other

## 2021-03-30 ENCOUNTER — Ambulatory Visit: Payer: Medicare Other | Admitting: Hematology

## 2024-09-08 NOTE — Telephone Encounter (Signed)
 Pt called in and canceled surgery scheduled for today at 2:30

## 2024-09-08 NOTE — Telephone Encounter (Signed)
 Lvm for pt to call back to reschedule surgery.
